# Patient Record
Sex: Female | Born: 1950 | Race: White | Hispanic: No | Marital: Married | Smoking: Never smoker
Health system: Southern US, Community
[De-identification: ages and names within clinical notes are randomized; demographics above are authoritative.]

## PROBLEM LIST (undated history)

## (undated) DIAGNOSIS — M549 Dorsalgia, unspecified: Secondary | ICD-10-CM

## (undated) DIAGNOSIS — F32A Depression, unspecified: Secondary | ICD-10-CM

## (undated) DIAGNOSIS — E039 Hypothyroidism, unspecified: Secondary | ICD-10-CM

## (undated) DIAGNOSIS — N183 Chronic kidney disease, stage 3 unspecified: Secondary | ICD-10-CM

## (undated) DIAGNOSIS — R42 Dizziness and giddiness: Secondary | ICD-10-CM

## (undated) DIAGNOSIS — G8929 Other chronic pain: Secondary | ICD-10-CM

## (undated) DIAGNOSIS — F329 Major depressive disorder, single episode, unspecified: Secondary | ICD-10-CM

## (undated) DIAGNOSIS — N189 Chronic kidney disease, unspecified: Secondary | ICD-10-CM

## (undated) DIAGNOSIS — K219 Gastro-esophageal reflux disease without esophagitis: Secondary | ICD-10-CM

## (undated) DIAGNOSIS — K5010 Crohn's disease of large intestine without complications: Secondary | ICD-10-CM

## (undated) DIAGNOSIS — G4730 Sleep apnea, unspecified: Secondary | ICD-10-CM

## (undated) DIAGNOSIS — G629 Polyneuropathy, unspecified: Secondary | ICD-10-CM

## (undated) DIAGNOSIS — F410 Panic disorder [episodic paroxysmal anxiety] without agoraphobia: Secondary | ICD-10-CM

## (undated) DIAGNOSIS — I10 Essential (primary) hypertension: Secondary | ICD-10-CM

## (undated) DIAGNOSIS — M1990 Unspecified osteoarthritis, unspecified site: Secondary | ICD-10-CM

## (undated) DIAGNOSIS — I499 Cardiac arrhythmia, unspecified: Secondary | ICD-10-CM

## (undated) DIAGNOSIS — D509 Iron deficiency anemia, unspecified: Secondary | ICD-10-CM

## (undated) HISTORY — DX: Hypothyroidism, unspecified: E03.9

## (undated) HISTORY — PX: OTHER SURGICAL HISTORY: SHX169

## (undated) HISTORY — DX: Crohn's disease of large intestine without complications: K50.10

## (undated) HISTORY — PX: CHOLECYSTECTOMY: SHX55

## (undated) HISTORY — DX: Polyneuropathy, unspecified: G62.9

## (undated) HISTORY — DX: Panic disorder (episodic paroxysmal anxiety): F41.0

## (undated) HISTORY — PX: ABDOMINAL HYSTERECTOMY: SHX81

## (undated) HISTORY — DX: Iron deficiency anemia, unspecified: D50.9

## (undated) HISTORY — PX: COLON SURGERY: SHX602

---

## 2003-01-06 ENCOUNTER — Ambulatory Visit (HOSPITAL_COMMUNITY): Admission: RE | Admit: 2003-01-06 | Discharge: 2003-01-06 | Payer: Self-pay | Admitting: Internal Medicine

## 2006-07-14 ENCOUNTER — Inpatient Hospital Stay (HOSPITAL_COMMUNITY): Admission: RE | Admit: 2006-07-14 | Discharge: 2006-07-17 | Payer: Self-pay | Admitting: Orthopedic Surgery

## 2006-10-09 ENCOUNTER — Ambulatory Visit: Payer: Self-pay | Admitting: Internal Medicine

## 2007-06-18 ENCOUNTER — Ambulatory Visit: Payer: Self-pay | Admitting: Cardiology

## 2009-03-16 ENCOUNTER — Ambulatory Visit (HOSPITAL_COMMUNITY): Admission: RE | Admit: 2009-03-16 | Discharge: 2009-03-16 | Payer: Self-pay | Admitting: Urology

## 2009-11-12 ENCOUNTER — Emergency Department (HOSPITAL_BASED_OUTPATIENT_CLINIC_OR_DEPARTMENT_OTHER): Admission: EM | Admit: 2009-11-12 | Discharge: 2009-11-12 | Payer: Self-pay | Admitting: Emergency Medicine

## 2010-09-25 LAB — DIFFERENTIAL
Basophils Absolute: 0 10*3/uL (ref 0.0–0.1)
Basophils Relative: 1 % (ref 0–1)
Eosinophils Relative: 4 % (ref 0–5)
Monocytes Absolute: 0.4 10*3/uL (ref 0.1–1.0)
Monocytes Relative: 7 % (ref 3–12)
Neutro Abs: 3.2 10*3/uL (ref 1.7–7.7)

## 2010-09-25 LAB — URINE MICROSCOPIC-ADD ON

## 2010-09-25 LAB — CBC
HCT: 37.3 % (ref 36.0–46.0)
Hemoglobin: 12.7 g/dL (ref 12.0–15.0)
MCHC: 34 g/dL (ref 30.0–36.0)
RBC: 4.13 MIL/uL (ref 3.87–5.11)
RDW: 12 % (ref 11.5–15.5)

## 2010-09-25 LAB — BASIC METABOLIC PANEL
BUN: 16 mg/dL (ref 6–23)
Chloride: 107 mEq/L (ref 96–112)
Creatinine, Ser: 1 mg/dL (ref 0.4–1.2)
Glucose, Bld: 104 mg/dL — ABNORMAL HIGH (ref 70–99)
Potassium: 3.8 mEq/L (ref 3.5–5.1)

## 2010-09-25 LAB — RAPID STREP SCREEN (MED CTR MEBANE ONLY): Streptococcus, Group A Screen (Direct): NEGATIVE

## 2010-09-25 LAB — URINALYSIS, ROUTINE W REFLEX MICROSCOPIC
Bilirubin Urine: NEGATIVE
Glucose, UA: NEGATIVE mg/dL
Specific Gravity, Urine: 1.024 (ref 1.005–1.030)

## 2010-11-23 NOTE — Op Note (Signed)
Jessica [name], Jessica [name]                ACCOUNT NO.:  [id number]   MEDICAL RECORD NO.:  [id number]          PATIENT TYPE:  INP   LOCATION:  X005                         FACILITY:  [facility]   PHYSICIAN:  Gaynelle Arabian, M.D.    DATE OF BIRTH:  09-Sep-1950   DATE OF PROCEDURE:  07/14/2006  DATE OF DISCHARGE:                               OPERATIVE REPORT   PREOPERATIVE DIAGNOSIS:  Osteoarthritis right hip.   POSTOPERATIVE DIAGNOSIS:  Osteoarthritis right hip.   PROCEDURE:  Right total hip arthroplasty.   SURGEON:  Dr. Wynelle Link   ASSISTANT:  [name], PA-C   ANESTHESIA:  General.   ESTIMATED BLOOD LOSS:  300.   DRAINS:  Hemovac x1.   COMPLICATIONS:  None.   CONDITION:  Stable to recovery.   BRIEF CLINICAL NOTE:  Jessica [name] is a 60 year old female with end-stage  osteoarthritis of the right hip.  She has had intractable pain and  failed nonoperative management.  She presents for total hip  arthroplasty.   PROCEDURE IN DETAIL:  After successful initiation of general anesthetic,  she is placed in the left lateral decubitus position with the right side  up and held with the hip positioner.  Right lower extremity is isolated  from perineum with plastic drapes and prepped and draped in the usual  sterile fashion.  Short posterolateral incision was made with 10 blade  through subcutaneous tissue to the level of fascia lata which was  incised in line with the skin incision.  Sciatic nerve was palpated and  protected and the short external rotators isolated off the femur.  Capsulectomy is performed, and the hip is dislocated.  Center of the  femoral head is marked and a trial prosthesis placed such that the  center of the trial head corresponds to the center of her native femoral  head.  Osteotomy line is marked on the femoral neck and osteotomy made  with an oscillating saw.  Femoral head is removed and then the femur  retracted anteriorly to gain acetabular exposure.   Acetabular  retractors were placed and the labrum and osteophytes  removed.  She had a very hypertrophic anterior labrum with degeneration.  Acetabular reaming starts at 47 mm coursing increments of 2-51 mm and a  52 mm pinnacle acetabular shell was placed in anatomic position and  transfixed with dome screw.  The permanent apex hole eliminator and 36  mm neutral Ultramet needle metal liner was placed.   The femur is addressed.  It is prepared with the canal finder and  irrigation.  Axial reaming is performed at 13.5 mm, proximal reaming to  an18D, and the sleeve machined to a large.  Eighteen D large trial  sleeve is placed and the 18 x 13 stem and 36 plus 8 neck matching her  native anteversion.  A 36.0 trial head is placed.  The hip is reduced  with great stability.  She had full extension, full external rotation 70  degrees flexion, 40 degrees adduction and 90 degrees internal rotation  then 90 degrees of flexion and 70 degrees of internal rotation.  By  placing the right leg on top of the left, it felt as though the leg  lengths are equal.   The hip was then dislocated and the trials removed.  The permanent 18 D  large sleeve is placed, 18 x 13 stem and 36 plus 8 neck matching native  anteversion.  The 36.0 head is placed.  The hip was reduced with  outstanding stability.  Wound was copiously irrigated with saline  solution and the short rotators reattached to the femur through drill  holes.  Fascia lata is closed over a Hemovac drain with interrupted #1  Vicryl and subcu closed with #1 and then 2-0 Vicryl and subcuticular  running 4-0 Monocryl.  Drain is hooked to suction.  Incision cleaned and  dried and Steri-Strips and a bulky sterile dressing applied.  She is  then awakened and transferred to recovery in stable condition.      Gaynelle Arabian, M.D.  Electronically Signed     FA/MEDQ  D:  07/14/2006  T:  07/14/2006  Job:  [id number]

## 2010-11-23 NOTE — Discharge Summary (Signed)
[name], [name]                ACCOUNT NO.:  [id number]   MEDICAL RECORD NO.:  [id number]          PATIENT TYPE:  INP   LOCATION:  1508                         FACILITY:  [facility]   PHYSICIAN:  Gaynelle Arabian, M.D.    DATE OF BIRTH:  May 04, 1951   DATE OF ADMISSION:  07/14/2006  DATE OF DISCHARGE:  07/17/2006                               DISCHARGE SUMMARY   ADMITTING DIAGNOSES:  1. Osteoarthritis right hip.  2. Depression.  3. Hypertension.  4. Hiatal hernia.  5. Reflux disease.  6. Crohn's disease.  7. History of urinary tract infections.  8. Past history of hematuria.  9. Hypothyroidism.  10.Postmenopausal.   DISCHARGE DIAGNOSES:  1. Osteoarthritis right hip status post right total hip arthroplasty.  2. Depression.  3. Hypertension.  4. Hiatal hernia.  5. Reflux disease.  6. Crohn's disease.  7. History of urinary tract infections.  8. Past history of hematuria.  9. Hypothyroidism.  10.Postmenopausal.   PROCEDURE:  July 14, 2006 right total hip, surgeon Dr. Wynelle Link,  assistant [name], PA-C, anesthesia general.   CONSULTATIONS:  None.   BRIEF HISTORY:  Jessica [name] is a 60 year old female with end-stage arthritis of  her right hip with intractable pain failed nonoperative management now  presents for total hip.   LABORATORY DATA:  Preop CBC showed hemoglobin 12.1, hematocrit of 34.8,  white cell count 9.6, postop hemoglobin 10.8 drifted down to 10.2, last  known H&H 9.7 and 27.6.  PT and PTT preop 12.8 and 31, respectively; INR  1.0.  Serial ProTime's last known PT/INR 28.4 and 2.5.  Chem panel on  admission all within normal limits.  Serial BMETs were followed.  Electrolytes remained within normal limits.  Preop UA moderate  hemoglobin 0-2 white cells, few bacteria, rare epithelial cells.  Blood  group type O positive.   ELECTROCARDIOGRAM:  July 04, 2006 normal sinus rhythm, inferior  infarct age undetermined, no previous to compare confirmed by Dr. Mertie Moores.   Portable pelvis July 14, 2006 good position alignment in one view  status post right hip arthroplasty.  Portable chest July 14, 2006 no  active cardiopulmonary disease, stable calcified granulomata in the left  lower lobe, right upper extremity PICC coiled in the axilla and  subclavian veins.  Two-view chest July 04, 2006 no active disease.  Right hip films preop degenerative arthritis involving the right hip  joint July 04, 2006.  Portable pelvis satisfactory position of right  PIC line July 14, 2006.   HOSPITAL COURSE:  The patient was taken to the hospital, had a PIC line  placed preoperatively with confirmation by chest x-ray then she was  taken to the OR and underwent above stated procedure without  complications.  The patient tolerated procedure well and later to  recovery room on the orthopedic floor, had a rough night on the evening  of surgery, started on PCA and p.o. analgesics, doing a little bit  better on the morning of day #1, started to have some itching and hives  but this has been going on for 3 weeks now, used Benadryl and  hydrocortisone cream, started back on her home meds.  Preop UA was  positive for blood, otherwise negative, did have past history of  hematuria, this was noted to be preoperatively.  Started also IV Pepcid  for H2 blocker.  Started getting up out of bed with PT.  By day #2 she  was doing a little bit better, the itching had improved, started getting  up and walk about 50 feet and 55 feet later that day.  Dressing was  changed and incision looked good.  Continued to progress well with  physical therapy and by day #3 she was progressing well, tolerating her  meds which she had been weaned over to and was decided to be discharged  home at that time.   DISCHARGE PLAN:  1. The patient discharged home on July 17, 2006.  2. Discharge diagnoses:  Please see above.  3. Discharge medications:  Coumadin, Percocet, Robaxin.  4.  Diet:  Resume home diet.  5. Followup:  2 weeks.  6. Activity:  Partial weightbearing 25-50%.  Total hip protocol.  Home      with PT.  Home with nursing.  7. Disposition:  Home.  8. Condition upon discharge:  Improved.      Jessica [name], P.A.      Gaynelle Arabian, M.D.  Electronically Signed    ALP/MEDQ  D:  08/15/2006  T:  08/15/2006  Job:  [id number]   cc:   Monico Blitz, MD  Fax: [phone]

## 2010-11-23 NOTE — H&P (Signed)
[name], [name]                           ACCOUNT NO.:  [id number]   MEDICAL RECORD NO.:  [id number]                  PATIENT TYPE:   LOCATION:                                       FACILITY:   PHYSICIAN:  Hildred Laser, M.D.                 DATE OF BIRTH:  1951/02/20   DATE OF ADMISSION:  DATE OF DISCHARGE:                                HISTORY & PHYSICAL   PRESENTING COMPLAINT:  Heartburn, dysphagia, lower abdominal pain, diarrhea.   HISTORY OF PRESENT ILLNESS:  Jessica [name] is a 60 year old Caucasian female with a  several year history of Crohn's disease who is status post right  hemicolectomy in 1997 presents with multiple complaints.  She was initially  seen in our office in September 2000 and most recently on April 15, 2001  when she was complaining of lower abdominal pain and increasing diarrhea and  felt to have a relapse of her Crohn's disease.  She was treated with  Entocort and asked to stay on Pentasa.  At that time she did have small  bowel follow through which was normal other then post-op changes.  She has  not been feeling well for the past few months.  She stopped her Pentasa a  while back.  She states she works second shift anywhere from 10-12 hours per  day and it is just hard for her to take Pentasa four times a day.  However,  she never called [place] or asked if she could take it b.i.d. or in any other way  or if there were alternatives.  She has been experiencing severe pain across  her lower abdomen.  She also has diarrhea with anywhere from 8-10 stools per  day.  Stools are loose and watery.  She has not had any melena or rectal  bleeding.  She states her lower abdominal pain is crampy and it is worse  after she has been under stress.  She has lost 6 pounds in the last 20  months.  She has not experienced any fever or chills.  She also complains of  heartburn which is almost every day or every other day.  It is worse with  certain foods.  She has been using  Zantac on a p.r.n. basis which helps  some.  She previously had been on Nexium but came off of it because of  coverage issues.  She also complains of dysphagia which she has experienced  for the last 2-3 months.  It is primarily to solids.  She points to her  midsternal area as the site of bolus obstruction which eventually passes  down.  She also has frequent regurgitation.  She denies odynophagia,  hoarseness or chronic cough.  She states her appetite is too good.  She is  afraid to eat because of her cramps and diarrhea.  She has been under a lot  of stress recently.  She just  came off a two week leave of absence and she  had taken a few more prior to that.  Imodium does not work well with the  diarrhea and she has been afraid to take Lomotil.  She still has some from  the last prescription.  However, she knows it does help her.  She was told  by a physician a few years back that she could be become addicted to this  medication.   MEDICATIONS:  1. She is on Estratest unknown dose daily.  2. Synthroid 125 mcg daily.  3. Lisinopril 20 mg daily.  4. Lomotil 1 t.i.d. p.r.n. she takes occasionally.  5. Aspirin 81 mg daily.   PAST MEDICAL HISTORY:  1. She has hypertension.  2. Hypothyroidism.  3. History of osteopenia.  4. She has had a hysterectomy.  5. Cholecystectomy.  6. Removal of a lipoma from the right cheek mandible area.  7. As noted above she had a right hemicolectomy for Crohn's disease seven     years ago.   ALLERGIES:  None known.   FAMILY HISTORY:  Significant for Crohn's disease in a brother.   SOCIAL HISTORY:  She is presently working at [employer].  She does not smoke  cigarettes and drinks alcohol very occasionally.   PHYSICAL EXAMINATION:  GENERAL:  A pleasant mildly obese Caucasian female  who is in no acute distress.  VITAL SIGNS:  She weighs 184 pounds.  She is 5 feet 2 inches tall.  Pulse 96  per minute, blood pressure 114/80.  She is afebrile.  HEENT:   Oropharyngeal mucosa  is normal.  NECK:  Without masses or thyromegaly.  CARDIAC:  Regular rhythm.  Normal S1 and S2.  No murmur or gallop noted.  LUNGS:  Clear to auscultation.  ABDOMEN:  Full.  Bowel sounds are hyperactive.  Palpation reveals mild  tenderness across the lower abdomen.  No organomegaly or masses noted.  RECTAL:  Deferred.  EXTREMITIES:  She does not have peripheral edema or clubbing.   ASSESSMENT:  1. History of Crohn's disease.  The patient now presents with progressive     diarrhea and lower abdominal cramps.  This is possibly due to a relapse     of Crohn's disease.  She could also have irritable bowel syndrome along     with that.  She is presently on no maintenance therapy.  She maybe a     candidate for other agents but we will have to first determine whether or     not her Crohn's disease is active.  2. Chronic gastroesophageal reflux disease.  Patient now has solid food     dysphagia.   RECOMMENDATIONS:  1. Will start her on Levbid 1/2-1 tablet every morning.  2. Questran 4 grams b.i.d.  3. Lomotil one t.i.d. p.r.n. prescription given for 60 with a refill.  4. She will start on Protonix 40 mg q.a.m.  5. EGD with ED followed by total colonoscopy to be performed at [facility] in the     near future.  Further recommendations will depend on endoscopic findings.                                               Hildred Laser, M.D.    NR/MEDQ  D:  12/14/2002  T:  12/14/2002  Job:  [id number]   cc:   Monico Blitz  [place]  [place] [address]  Fax: [phone]

## 2010-11-23 NOTE — H&P (Signed)
Jessica [name], Jessica [name]                ACCOUNT NO.:  [id number]   MEDICAL RECORD NO.:  [id number]           PATIENT TYPE:   LOCATION:                               FACILITY:  [facility]   PHYSICIAN:  Gaynelle Arabian, M.D.         DATE OF BIRTH:   DATE OF ADMISSION:  DATE OF DISCHARGE:                              HISTORY & PHYSICAL   CHIEF COMPLAINT:  Right hip pain.   HISTORY OF PRESENT ILLNESS:  The patient is a 60 year old female seen in  second opinion by Dr. Wynelle Link for ongoing right hip pain, progressive in  nature.  She has undergone trochanteric bursal injections by Dr.  Alfredo Batty. Unfortunately this did not provide much relief.  The pain  was in the groin, anterior thigh, lateral thigh.  She started having  increasing problems with walking.  She \was seen by Dr. Wynelle Link and  found to eventually have arthritic changes in the right hip with bone on  bone throughout with some osteophyte formation.  She is known to have  end-stage arthritis.  At this point it is felt the only significant way  of improving her function would be hip replacement.  Risks and benefits  discussed.  The patient was subsequently admitted to the hospital.   ALLERGIES:  No known drug allergies.   CURRENT MEDICATIONS:  Prevacid, levothyroxine, Celebrex, lisinopril,  Zoloft, , Caltrate D, Centrum Silver, low dose baby aspirin.   PAST MEDICAL HISTORY:  1. Depression.  2. Hypertension.  3. Hiatal hernia.  4. Reflux disease.  5. Crohn's disease.  6. History of UTIs.  7. Past history of hematuria, likely due to previous UTIs.  8. Hypothyroidism.  9. History of arthritis.  10.Postmenopausal.   PAST SURGICAL HISTORY:  1. Partial hysterectomy.  2. Partial colectomy.  3. Cholecystectomy.  4. Lumbar surgery.  5. Lipoma excision.   SOCIAL HISTORY:  Married, Public relations account executive.  Nonsmoker, no alcohol.  Two children.   FAMILY HISTORY:  Mother living, age 24, with history of osteoarthritis,  heart disease, and  mini strokes.  Father deceased, age 69, with history  of aneurysm of the aorta.   REVIEW OF SYSTEMS:  GENERAL:  No fever, chills, night sweats.  NEUROLOGIC:  No seizures, syncope, paralysis.  RESPIRATORY:  No  shortness of breath, productive cough, or hemoptysis.  CARDIOVASCULAR:  No chest pain, angina, or orthopnea.  GASTROINTESTINAL:  Does have  Crohn's disease.  No nausea, vomiting, diarrhea, constipation at this  time.  GENITOURINARY:  History of hematuria, felt to be due likely to  urine infections, possibly stones in the past.  Denies any hematuria or  dysuria at this time.  MUSCULOSKELETAL:  Right hip.   PHYSICAL EXAMINATION:  VITAL SIGNS:  Pulse 76, respiratory rate 14,  blood pressure 142/94.  GENERAL:  A 60 year old white female, well-nourished, well-developed,  short stature, slightly overweight.  Alert and oriented, cooperative,  pleasant, accompanied by her husband.  HEENT:  Normocephalic, atraumatic.  Pupils are round and reactive.  Oropharynx clear.  EOMs intact.  NECK:  Supple.  CHEST:  Clear.  HEART:  Regular rate and rhythm.  S1/S2.  ABDOMEN:  Soft, slightly round.  Bowel sounds present.  RECTAL/BREASTS/GENITALIA:  Not done, not pertinent to present illness.  EXTREMITIES:  Right hip flexion 90, 0 internal rotation, 10 degrees of  external rotation.  Abduction 20, slight limp.   IMPRESSION:  1. Osteoarthritis, right hip.  2. Depression.  3. Hypertension.  4. Hiatal hernia.  5. Reflux disease.  6. Crohn's disease.  7. History of urinary tract infections.  8. Past history of hematuria.  9. Hypothyroidism.  10.Postmenopausal.   PLAN:  The patient is admitted to [facility] to undergo a  right total hip arthroplasty.  Surgery will be performed by Dr. Gaynelle Arabian.      Alexzandrew L. Dara Lords, P.A.      Gaynelle Arabian, M.D.  Electronically Signed    ALP/MEDQ  D:  07/13/2006  T:  07/14/2006  Job:  [id number]   cc:   Monico Blitz, MD  Fax:  [phone]   Gaynelle Arabian, M.D.  Fax: [phone]

## 2010-11-23 NOTE — Op Note (Signed)
[name], [name]                          ACCOUNT NO.:  [id number]   MEDICAL RECORD NO.:  [id number]                   PATIENT TYPE:  AMB   LOCATION:  DAY                                  FACILITY:  [facility]   PHYSICIAN:  Hildred Laser, M.D.                 DATE OF BIRTH:  07-08-1951   DATE OF PROCEDURE:  01/06/2003  DATE OF DISCHARGE:                                  PROCEDURE NOTE   PROCEDURE:  Esophagogastroduodenoscopy with esophageal dilatation followed  by total colonoscopy.   INDICATIONS:  Jessica [name] is a 60 year old Caucasian female who has chronic  heartburn and has been on OTC medications with poor to fair control of her  heartburn who is now having solid food dysphagia.  She was begun on Protonix  and has noted improvement in her heartburn.  She has history of Crohn's  disease.  She has had right hemicolectomy about seven years ago.  She is  presently on no maintenance therapy and having 6-10 stools per day.  Some of  her symptoms are suggestive of irritable bowel syndrome but she could also  have relapse of her Crohn's disease and after undergoing evaluation.   Both procedures were reviewed with the patient and informed consent was  obtained.   PREMEDICATION:  Cetacaine spray for pharyngeal topical anesthesia, Demerol  50 mg IV, Versed 8 mg IV in divided doses.   INSTRUMENT:  Olympus video system.   FINDINGS:  The procedures were performed in the endoscopy suite.  The  patient's vital signs and O2 saturation were monitored during the procedure  and remained stable.   ESOPHAGOGASTRODUODENOSCOPY:  The patient was placed in the left lateral  recumbent position and endoscope was passed through the oropharynx without  any difficulty into the esophagus.  Esophagus:  Mucosa of the proximal and middle third was normal.  Distally  she had three to four erosions, the largest was about 5 x 5 mm extending  into the GE junction which was _______, and there was a 3 cm size  sliding  hiatal hernia.  Stomach:  It was empty and distended very well on insufflation.  Folds of  the proximal stomach were normal.  Examination of the mucosa, gastric body,  antrum, pyloric channel, as well as angularis, fundus and cardia was normal.  Duodenum:  Examination of the bulb and post bulbar duodenum was normal.   The endoscope was withdrawn and the esophagus was dilated by passing 54-  [place] Maloney dilator through the esophagus.  Esophagus was reexamined post  dilatation and there was no mucosal injury.  I therefore, opted to dilate  the GE junction with an 18-20 balloon.  This resulted in a very small linear  tear of the GE junction.  Endoscope was withdrawn and the patient was  prepared for procedure #2.   TOTAL COLONOSCOPY:  Rectal examination performed.  No abnormality noted on  the  external or digital exam.  Scope was placed in the rectum and advanced  under vision to the sigmoid colon and beyond.  She had satisfactory prep.  The scope was passed into the area of hepatic flexure which was very  tortuous.  Using different positions and abdominal pressure, I was able to  get the scope through the ileocolic anastomosis which was wide open.  Short  segment of terminal ileum was examined above that.  There were three  erosions but no frank ulceration or stricture.  Pictures were taken for the  record.  As the scope was withdrawn, colonic mucosa was carefully examined.  There was a single diverticulum a few centimeters below the anastomosis.  Had very stromal erythema but there was no ulceration.  Mucosa of the rest  of the colon was normal.  Rectal mucosa similarly was normal.  Scope was  retroflexed, examined the anorectal junction and hemorrhoids were noted  below the dentate line.  Endoscope was straightened and withdrawn. The  patient tolerated the procedure well.   FINAL DIAGNOSES:  1. Erosive reflux esophagitis except noncritical distal esophageal ring and      has a small sliding hiatal hernia.  Esophagus was dilated both by passing     54-[place] Maloney dilator and also using 18-20 mm balloon.  2. Normal examination of the stomach for a second part of duodenum.  3. Single diverticulum at transverse colon with mild symptoms of     diverticulitis.  This does not appear to be causing the symptoms;     nevertheless needs to be treated.  4. Erosions involving the distal small bowel proximal to the ileocolic     anastomosis.  5. Small external hemorrhoids.  6. Suspect her diarrhea is mainly due to bile effect on the colon and     perhaps mild Crohn's disease.   RECOMMENDATIONS:  1. She is going to need antireflux measures and Protonix at 40 mg p.o.     q.a.m.  Prescription given for a month, five refills.  2. She is to continue Levbid one tablet every morning and Questran 4-8 grams     per day.  3. I feel she needs to be on Pentasa, low dose of 1 g b.i.d., would be     appropriate, at least for now.  4. Cipro 500 mg p.o. b.i.d. for one week.  5. Metronidazole 500 mg p.o. b.i.d. for one week.  6. I would like to see her back in followup two months from now.                                                Hildred Laser, M.D.    NR/MEDQ  D:  01/06/2003  T:  01/07/2003  Job:  [id number]   cc:   Monico Blitz  [place]  [place] [address]  Fax: [phone]

## 2010-12-27 DIAGNOSIS — R072 Precordial pain: Secondary | ICD-10-CM

## 2012-12-30 ENCOUNTER — Telehealth (INDEPENDENT_AMBULATORY_CARE_PROVIDER_SITE_OTHER): Payer: Self-pay | Admitting: *Deleted

## 2012-12-30 NOTE — Telephone Encounter (Signed)
Jessica [name] called our office today, 12/30/12 to schedule an apt. We have no records in [facility] or paper chart. Patient states she needs a f/u and it may be time for her next procedure. Langston said it may have been 4 years ago scene her last visit. Advised [name] she will need a referral from her PCP before an apt can be scheduled and I would try to get a copy of her last TCS. [name] has taken it upon her self to call [facility] to try get a copy of her records stating [facility] would not see her and that she is having a flare up with Crohns. Per Leonia Reeves, please have Terri call Zina and see what is going on. A copy of TCS done in 2008 has been given to Deberah Castle, NP. Patient's return phone number is [phone].

## 2012-12-30 NOTE — Telephone Encounter (Signed)
I have called patient and left a message for her to call us back.

## 2012-12-30 NOTE — Telephone Encounter (Signed)
Message left at home

## 2012-12-31 NOTE — Telephone Encounter (Signed)
Patient returned Terri's call from [phone]. Transferred the message to her voice mail box at ext [phone].

## 2012-12-31 NOTE — Telephone Encounter (Signed)
No answer at this number. No answering machine

## 2013-01-11 ENCOUNTER — Encounter (INDEPENDENT_AMBULATORY_CARE_PROVIDER_SITE_OTHER): Payer: Self-pay | Admitting: *Deleted

## 2013-02-10 ENCOUNTER — Telehealth (INDEPENDENT_AMBULATORY_CARE_PROVIDER_SITE_OTHER): Payer: Self-pay | Admitting: *Deleted

## 2013-02-10 ENCOUNTER — Other Ambulatory Visit (INDEPENDENT_AMBULATORY_CARE_PROVIDER_SITE_OTHER): Payer: Self-pay | Admitting: *Deleted

## 2013-02-10 ENCOUNTER — Encounter (INDEPENDENT_AMBULATORY_CARE_PROVIDER_SITE_OTHER): Payer: Self-pay | Admitting: Internal Medicine

## 2013-02-10 ENCOUNTER — Ambulatory Visit (INDEPENDENT_AMBULATORY_CARE_PROVIDER_SITE_OTHER): Payer: BC Managed Care – PPO | Admitting: Internal Medicine

## 2013-02-10 VITALS — BP 132/68 | HR 76 | Temp 98.7°F | Ht 62.0 in | Wt 186.5 lb

## 2013-02-10 DIAGNOSIS — G629 Polyneuropathy, unspecified: Secondary | ICD-10-CM | POA: Insufficient documentation

## 2013-02-10 DIAGNOSIS — K5090 Crohn's disease, unspecified, without complications: Secondary | ICD-10-CM

## 2013-02-10 DIAGNOSIS — E039 Hypothyroidism, unspecified: Secondary | ICD-10-CM | POA: Insufficient documentation

## 2013-02-10 DIAGNOSIS — Z1211 Encounter for screening for malignant neoplasm of colon: Secondary | ICD-10-CM

## 2013-02-10 DIAGNOSIS — K5010 Crohn's disease of large intestine without complications: Secondary | ICD-10-CM | POA: Insufficient documentation

## 2013-02-10 LAB — CBC WITH DIFFERENTIAL/PLATELET
Basophils Absolute: 0 10*3/uL (ref 0.0–0.1)
Basophils Relative: 0 % (ref 0–1)
MCHC: 33.4 g/dL (ref 30.0–36.0)
Monocytes Absolute: 0.4 10*3/uL (ref 0.1–1.0)
Neutro Abs: 2.3 10*3/uL (ref 1.7–7.7)
Neutrophils Relative %: 46 % (ref 43–77)
Platelets: 195 10*3/uL (ref 150–400)
RDW: 14 % (ref 11.5–15.5)
WBC: 4.9 10*3/uL (ref 4.0–10.5)

## 2013-02-10 MED ORDER — DICYCLOMINE HCL 10 MG PO CAPS: 10.0000 mg | ORAL_CAPSULE | Freq: Three times a day (TID) | ORAL | Status: DC

## 2013-02-10 NOTE — Telephone Encounter (Signed)
Patient needs trilyte 

## 2013-02-10 NOTE — Patient Instructions (Addendum)
CBC,sedrate, Surveillance colonoscopy  Dicyclomine 55m BID

## 2013-02-10 NOTE — Progress Notes (Signed)
Subjective:     Patient ID: Jessica [name], female   DOB: 05-11-1951, 62 y.o.   MRN: [id number]  HPI Referred to our office a surveillance colonoscopy for Crohn's disease.(Dr. [name]). Appetite is good. No weight loss. No dysphagia.  No abdominal pain. She usually has a BM 4-5 days and they are loose. She has not seen any blood in her stools.  She tells me her last Crohn's flare 1998. She tells me she has diarrhea frequently. She very seldom has a regular BM since her rt hemicolectomy. She tells me she was maintained on Pentasa in 2008 but stopped after a year or so. She did not like taking medications.   She is on C-pap at home.   Colonoscopy 03/05/2007: Dr. Laural Golden: 62 yr old female with over a 87 yr hx of small bowel Crohn's disease with surgery possible rt hemicolectomy over 21 yrs ago.  Normal colonic mucosa. Patient ileocecal anastomosis with 64m ulceration and another erosion. No evidence of stricture or disease involving the neoterminal ileum. Biopsy: Acute ileitis, nonspecific.    EGD/Colonoscopy 2004: Chronic GERD, Crohn's disease:    1. Erosive reflux esophagitis except noncritical distal esophageal ring and  has a small sliding hiatal hernia. Esophagus was dilated both by passing  54-French Maloney dilator and also using 18-20 mm balloon.  2. Normal examination of the stomach for a second part of duodenum.  3. Single diverticulum at transverse colon with mild symptoms of  diverticulitis. This does not appear to be causing the symptoms;  nevertheless needs to be treated.  4. Erosions involving the distal small bowel proximal to the ileocolic  anastomosis.  5. Small external hemorrhoids.  6. Suspect her diarrhea is mainly due to bile effect on the colon and  perhaps mild Crohn's disease.   Review of Systems see hpi Current Outpatient Prescriptions  Medication Sig Dispense Refill  . aspirin 81 MG tablet Take 81 mg by mouth daily.      . calcium-vitamin D (OSCAL WITH D)  500-200 MG-UNIT per tablet Take 1 tablet by mouth.      . cholecalciferol (VITAMIN D) 1000 UNITS tablet Take by mouth 2 (two) times daily before a meal.       . fluticasone (FLONASE) 50 MCG/ACT nasal spray Place 2 sprays into the nose daily.      .Marland KitchenHYDROcodone-homatropine (HYDROMET) 5-1.5 MG/5ML syrup Take by mouth every 6 (six) hours as needed for cough.      . levothyroxine (SYNTHROID) 112 MCG tablet Take 112 mcg by mouth daily before breakfast.      . lisinopril (PRINIVIL,ZESTRIL) 20 MG tablet Take 20 mg by mouth daily.      .Marland Kitchenomeprazole (PRILOSEC) 20 MG capsule Take 20 mg by mouth daily.      . sertraline (ZOLOFT) 50 MG tablet Take 50 mg by mouth daily.      . vitamin B-12 (CYANOCOBALAMIN) 100 MCG tablet Take 50 mcg by mouth daily.       No current facility-administered medications for this visit.  " Past Medical History  Diagnosis Date  . Crohn's colitis   . Neuropathy   . Hypothyroid    Past Surgical History  Procedure Laterality Date  . Colon surgery      [place]       [place]     No Known Allergies     Objective:   Physical Exam  Filed Vitals:   02/10/13 1505  BP: 132/68  Pulse: 76  Temp: 98.7 F (37.1 C)  Height: 5' 2"  (1.575 m)  Weight: 186 lb 8 oz (84.596 kg)  Alert and oriented. Skin warm and dry. Oral mucosa is moist.   . Sclera anicteric, conjunctivae is pink. Thyroid not enlarged. No cervical lymphadenopathy. Lungs clear. Heart regular rate and rhythm.  Abdomen is soft. Bowel sounds are positive. No hepatomegaly. No abdominal masses felt. Slight rt lower quadrant tenderness.  No edema to lower extremities.         Assessment:    Crohn's disease. She seems to be in remission at this time. She is doing well.   She did have slight rt lower quadrant tenderness today.    Plan:    Surveillance colonoscopy for Crohn's. CBC Sedrate. Dicyclomine 20m BID.

## 2013-02-11 LAB — SEDIMENTATION RATE: Sed Rate: 8 mm/hr (ref 0–22)

## 2013-02-11 MED ORDER — PEG 3350-KCL-NA BICARB-NACL 420 G PO SOLR: 4000.0000 mL | Freq: Once | ORAL | Status: DC

## 2013-02-25 ENCOUNTER — Encounter (INDEPENDENT_AMBULATORY_CARE_PROVIDER_SITE_OTHER): Payer: Self-pay

## 2013-03-11 ENCOUNTER — Ambulatory Visit (HOSPITAL_COMMUNITY)
Admission: RE | Admit: 2013-03-11 | Discharge: 2013-03-11 | Disposition: A | Discharge status: Home / Self Care | Payer: BC Managed Care – PPO | Source: Ambulatory Visit | Attending: Internal Medicine | Admitting: Internal Medicine

## 2013-03-11 ENCOUNTER — Encounter (HOSPITAL_COMMUNITY)
Admission: RE | Disposition: A | Discharge status: Home / Self Care | Payer: Self-pay | Source: Ambulatory Visit | Attending: Internal Medicine

## 2013-03-11 ENCOUNTER — Encounter (HOSPITAL_COMMUNITY): Payer: Self-pay | Admitting: *Deleted

## 2013-03-11 DIAGNOSIS — K633 Ulcer of intestine: Secondary | ICD-10-CM | POA: Insufficient documentation

## 2013-03-11 DIAGNOSIS — K5090 Crohn's disease, unspecified, without complications: Secondary | ICD-10-CM

## 2013-03-11 DIAGNOSIS — K5289 Other specified noninfective gastroenteritis and colitis: Secondary | ICD-10-CM | POA: Insufficient documentation

## 2013-03-11 DIAGNOSIS — R197 Diarrhea, unspecified: Secondary | ICD-10-CM | POA: Insufficient documentation

## 2013-03-11 DIAGNOSIS — Z9049 Acquired absence of other specified parts of digestive tract: Secondary | ICD-10-CM | POA: Insufficient documentation

## 2013-03-11 DIAGNOSIS — Z8719 Personal history of other diseases of the digestive system: Secondary | ICD-10-CM | POA: Insufficient documentation

## 2013-03-11 HISTORY — PX: COLONOSCOPY: SHX5424

## 2013-03-11 HISTORY — DX: Sleep apnea, unspecified: G47.30

## 2013-03-11 SURGERY — COLONOSCOPY
Anesthesia: Moderate Sedation

## 2013-03-11 MED ORDER — MESALAMINE ER 500 MG PO CPCR: 1000.0000 mg | ORAL_CAPSULE | Freq: Four times a day (QID) | ORAL | Status: DC

## 2013-03-11 MED ORDER — MIDAZOLAM HCL 5 MG/5ML IJ SOLN
INTRAMUSCULAR | Status: DC | PRN
  Administered 2013-03-11: 1 mg via INTRAVENOUS
  Administered 2013-03-11 (×2): 2 mg via INTRAVENOUS

## 2013-03-11 MED ORDER — STERILE WATER FOR IRRIGATION IR SOLN
Status: DC | PRN
  Administered 2013-03-11: 11:00:00

## 2013-03-11 MED ORDER — MEPERIDINE HCL 50 MG/ML IJ SOLN
INTRAMUSCULAR | Status: DC | PRN
  Administered 2013-03-11 (×2): 25 mg via INTRAVENOUS

## 2013-03-11 MED ORDER — MIDAZOLAM HCL 5 MG/5ML IJ SOLN
INTRAMUSCULAR | Status: AC
  Filled 2013-03-11: qty 10

## 2013-03-11 MED ORDER — MEPERIDINE HCL 50 MG/ML IJ SOLN
INTRAMUSCULAR | Status: AC
  Filled 2013-03-11: qty 1

## 2013-03-11 MED ORDER — SODIUM CHLORIDE 0.9 % IV SOLN
INTRAVENOUS | Status: DC
  Administered 2013-03-11: 11:00:00 via INTRAVENOUS

## 2013-03-11 NOTE — Op Note (Addendum)
COLONOSCOPY PROCEDURE REPORT  PATIENT:  Jessica [name]  MR#:  [id number] Birthdate:  12-09-1950, 62 y.o., female Endoscopist:  Dr. Rogene Houston, MD Referred By:  Dr. Monico Blitz, MD Procedure Date: 03/11/2013  Procedure:   Colonoscopy  Indications: Patient is 62 year-old Caucasian female with history of Crohn disease who presents with diarrhea. Her last colonoscopy was in August 2000 it. She is undergoing colonoscopy for diagnostic and surveillance purposes. She presently is on no therapy for IBD.  Informed Consent:  The procedure and risks were reviewed with the patient and informed consent was obtained.  Medications:  Demerol 50 mg IV Versed 5 mg IV  Description of procedure:  After a digital rectal exam was performed, that colonoscope was advanced from the anus through the rectum and colon to the area of the ileocolonic anastomosis located in the region of hepatic flexure.  The mucosal surfaces were carefully surveyed utilizing scope tip to flexion to facilitate fold flattening as needed. The scope was pulled down into the rectum where a thorough exam including retroflexion was performed.  Findings:  Prep satisfactory. Noncritical stricture at the ileocolonic anastomosis with small ulcer. Biopsy taken. Mucosa of a segment of colon and rectum was normal. Anorectal junction was unremarkable.   Therapeutic/Diagnostic Maneuvers Performed:  See above  Complications:  None  Cecal Withdrawal Time:  6 minutes  Impression:  Noncritical stricture at the ileocolonic anastomosis along with small ulcer. Biopsy taken. No evidence of colonic polyps or colitis.   Recommendations:  Standard instructions given. Resume Pentasa at 1 g by mouth 4 times a day. I will contact patient with biopsy results and further recommendations.  Lesleyann Fichter U  03/11/2013 11:34 AM  CC: Dr. Monico Blitz, MD & Dr. Rayne Du ref. provider found

## 2013-03-11 NOTE — H&P (Signed)
Jessica [name] is an 62 y.o. female.   Chief Complaint: Patient is  here for colonoscopy. HPI: Patient is 62 year old Caucasian female who has history of small bowel Crohn's status post right hemicolectomy 21 years ago who presents for diarrhea. She's having at least 5 stools a day. She denies rectal bleeding abdominal pain anorexia weight loss. Patient's last colonoscopy was in August 2008. Patient is presently on no maintenance therapy for IBD.  Past Medical History  Diagnosis Date  . Crohn's colitis   . Neuropathy   . Hypothyroid   . Sleep apnea     Past Surgical History  Procedure Laterality Date  . Colon surgery      for Crohn's in the 40s in [place]  . Hip replacement       Rt hip in 2010 in [place]  . Cholecystectomy    . Abdominal hysterectomy      Family History  Problem Relation Age of Onset  . Crohn's disease Brother   . Colon cancer Neg Hx    Social History:  reports that she has never smoked. She does not have any smokeless tobacco history on file. She reports that she does not drink alcohol or use illicit drugs.  Allergies:  Allergies  Allergen Reactions  . Morphine And Related     Causes itching    Medications Prior to Admission  Medication Sig Dispense Refill  . aspirin 81 MG tablet Take 81 mg by mouth daily.      . cholecalciferol (VITAMIN D) 1000 UNITS tablet Take 1,000 Units by mouth 2 (two) times daily before a meal.       . fluticasone (FLONASE) 50 MCG/ACT nasal spray Place 2 sprays into the nose daily.      Marland Kitchen gabapentin (NEURONTIN) 300 MG capsule Take 1 capsule by mouth 3 (three) times daily.      Marland Kitchen levothyroxine (SYNTHROID) 112 MCG tablet Take 112 mcg by mouth daily before breakfast.      . lisinopril (PRINIVIL,ZESTRIL) 20 MG tablet Take 20 mg by mouth daily.      Marland Kitchen omeprazole (PRILOSEC) 20 MG capsule Take 20 mg by mouth daily.      . polyethylene glycol-electrolytes (NULYTELY/GOLYTELY) 420 G solution Take 4,000 mLs by mouth once.  4000 mL  0   . sertraline (ZOLOFT) 50 MG tablet Take 50 mg by mouth daily.      . vitamin B-12 (CYANOCOBALAMIN) 100 MCG tablet Take 50 mcg by mouth daily.      . Vitamin D, Ergocalciferol, (DRISDOL) 50000 UNITS CAPS capsule Take 1 capsule by mouth once a week.      . calcium-vitamin D (OSCAL WITH D) 500-200 MG-UNIT per tablet Take 1 tablet by mouth daily with breakfast.       . dicyclomine (BENTYL) 10 MG capsule Take 1 capsule (10 mg total) by mouth 4 (four) times daily -  before meals and at bedtime.  90 capsule  2    No results found for this or any previous visit (from the past 48 hour(s)). No results found.  ROS  Blood pressure 169/98, pulse 76, temperature 98 F (36.7 C), temperature source Oral, resp. rate 14, height 5' 2"  (1.575 m), weight 186 lb (84.369 kg), SpO2 98.00%. Physical Exam  Constitutional: She appears well-developed and well-nourished.  HENT:  Mouth/Throat: Oropharynx is clear and moist.  Eyes: Conjunctivae are normal. No scleral icterus.  Neck: No thyromegaly present.  Cardiovascular: Normal rate, regular rhythm and normal heart sounds.   No  murmur heard. Respiratory: Effort normal and breath sounds normal.  GI: Soft. She exhibits no distension and no mass. There is no tenderness.  Midline and right subcostal scar  Musculoskeletal: She exhibits no edema.  Lymphadenopathy:    She has no cervical adenopathy.  Neurological: She is alert.  Skin: Skin is warm and dry.     Assessment/Plan Diarrhea. History of Crohn's disease. Diagnostic/surveillance colonoscopy.  [place] U 03/11/2013, 10:57 AM

## 2013-03-15 ENCOUNTER — Encounter (HOSPITAL_COMMUNITY): Payer: Self-pay | Admitting: Internal Medicine

## 2013-03-23 ENCOUNTER — Encounter (INDEPENDENT_AMBULATORY_CARE_PROVIDER_SITE_OTHER): Payer: Self-pay | Admitting: *Deleted

## 2013-03-24 DIAGNOSIS — G609 Hereditary and idiopathic neuropathy, unspecified: Secondary | ICD-10-CM | POA: Insufficient documentation

## 2013-03-24 DIAGNOSIS — M5417 Radiculopathy, lumbosacral region: Secondary | ICD-10-CM | POA: Insufficient documentation

## 2013-03-24 DIAGNOSIS — G5600 Carpal tunnel syndrome, unspecified upper limb: Secondary | ICD-10-CM | POA: Insufficient documentation

## 2013-03-24 DIAGNOSIS — G2581 Restless legs syndrome: Secondary | ICD-10-CM | POA: Insufficient documentation

## 2013-10-11 ENCOUNTER — Encounter (HOSPITAL_BASED_OUTPATIENT_CLINIC_OR_DEPARTMENT_OTHER): Payer: Self-pay | Admitting: Emergency Medicine

## 2013-10-11 ENCOUNTER — Emergency Department (HOSPITAL_BASED_OUTPATIENT_CLINIC_OR_DEPARTMENT_OTHER)
Admission: EM | Admit: 2013-10-11 | Discharge: 2013-10-11 | Disposition: A | Discharge status: Home / Self Care | Payer: BC Managed Care – PPO | Attending: Emergency Medicine | Admitting: Emergency Medicine

## 2013-10-11 DIAGNOSIS — Z8719 Personal history of other diseases of the digestive system: Secondary | ICD-10-CM | POA: Insufficient documentation

## 2013-10-11 DIAGNOSIS — Z7982 Long term (current) use of aspirin: Secondary | ICD-10-CM | POA: Insufficient documentation

## 2013-10-11 DIAGNOSIS — R5381 Other malaise: Secondary | ICD-10-CM | POA: Insufficient documentation

## 2013-10-11 DIAGNOSIS — R21 Rash and other nonspecific skin eruption: Secondary | ICD-10-CM | POA: Insufficient documentation

## 2013-10-11 DIAGNOSIS — I10 Essential (primary) hypertension: Secondary | ICD-10-CM | POA: Insufficient documentation

## 2013-10-11 DIAGNOSIS — E039 Hypothyroidism, unspecified: Secondary | ICD-10-CM | POA: Insufficient documentation

## 2013-10-11 DIAGNOSIS — N390 Urinary tract infection, site not specified: Secondary | ICD-10-CM | POA: Insufficient documentation

## 2013-10-11 DIAGNOSIS — IMO0002 Reserved for concepts with insufficient information to code with codable children: Secondary | ICD-10-CM | POA: Insufficient documentation

## 2013-10-11 DIAGNOSIS — R5383 Other fatigue: Secondary | ICD-10-CM

## 2013-10-11 DIAGNOSIS — Z79899 Other long term (current) drug therapy: Secondary | ICD-10-CM | POA: Insufficient documentation

## 2013-10-11 DIAGNOSIS — Z8669 Personal history of other diseases of the nervous system and sense organs: Secondary | ICD-10-CM | POA: Insufficient documentation

## 2013-10-11 HISTORY — DX: Essential (primary) hypertension: I10

## 2013-10-11 LAB — URINE MICROSCOPIC-ADD ON

## 2013-10-11 LAB — CBC
HCT: 36.8 % (ref 36.0–46.0)
HEMOGLOBIN: 12.7 g/dL (ref 12.0–15.0)
MCH: 32.6 pg (ref 26.0–34.0)
MCHC: 34.5 g/dL (ref 30.0–36.0)
MCV: 94.6 fL (ref 78.0–100.0)
PLATELETS: 183 10*3/uL (ref 150–400)
RBC: 3.89 MIL/uL (ref 3.87–5.11)
RDW: 12.8 % (ref 11.5–15.5)
WBC: 8.8 10*3/uL (ref 4.0–10.5)

## 2013-10-11 LAB — BASIC METABOLIC PANEL
BUN: 24 mg/dL — ABNORMAL HIGH (ref 6–23)
CHLORIDE: 103 meq/L (ref 96–112)
CO2: 22 meq/L (ref 19–32)
CREATININE: 1.1 mg/dL (ref 0.50–1.10)
Calcium: 9.6 mg/dL (ref 8.4–10.5)
GFR calc Af Amer: 61 mL/min — ABNORMAL LOW (ref >90)
GFR calc non Af Amer: 53 mL/min — ABNORMAL LOW (ref >90)
GLUCOSE: 89 mg/dL (ref 70–99)
Potassium: 3.7 mEq/L (ref 3.7–5.3)
SODIUM: 140 meq/L (ref 137–147)

## 2013-10-11 LAB — URINALYSIS, ROUTINE W REFLEX MICROSCOPIC
BILIRUBIN URINE: NEGATIVE
Glucose, UA: NEGATIVE mg/dL
Ketones, ur: NEGATIVE mg/dL
Leukocytes, UA: NEGATIVE
Nitrite: NEGATIVE
PH: 5.5 (ref 5.0–8.0)
Protein, ur: NEGATIVE mg/dL
SPECIFIC GRAVITY, URINE: 1.023 (ref 1.005–1.030)
UROBILINOGEN UA: 0.2 mg/dL (ref 0.0–1.0)

## 2013-10-11 MED ORDER — CEPHALEXIN 250 MG PO CAPS
500.0000 mg | ORAL_CAPSULE | Freq: Once | ORAL | Status: AC
  Administered 2013-10-11: 500 mg via ORAL
  Filled 2013-10-11: qty 2

## 2013-10-11 MED ORDER — CEPHALEXIN 500 MG PO CAPS: 500.0000 mg | ORAL_CAPSULE | Freq: Four times a day (QID) | ORAL | Status: DC

## 2013-10-11 NOTE — ED Notes (Signed)
Pt reports 3 weeks of feeling like she may have a uti also concerned that for 6 months has been having a weak feeling in her legs mainly from knees up she states she already is seeing a neurologist for a pinched nerve and has mentioned the leg weakness to them. Pt ambulates with steady quick gait to exam room and to the restroom to obtain urine specimen.

## 2013-10-11 NOTE — ED Notes (Signed)
Warm Blanket Provided.

## 2013-10-11 NOTE — ED Provider Notes (Signed)
CSN: [id number]     Arrival date & time 10/11/13  0712 History   First MD Initiated Contact with Patient 10/11/13 [phone]     Chief Complaint  Patient presents with  . poss uti    . Extremity Weakness     (Consider location/radiation/quality/duration/timing/severity/associated sxs/prior Treatment) HPI Pt presents with multiple complaints.  She states that she has been having burning with urination over the past several months and frequent urination.  No vomiting or fever/chills.  No abdominal pain. She also c/o feeling that her lower legs have been more weak than usual- mostly in thigh area for approx 5 months.  She is seeing a neurologist and has been evaluated for this by them as well.  She also mentions that yesterday she had a rash on her legs but this has gone away.  No incontinence of bowel or bladder, no new weakness of extremities.  There are no other associated systemic symptoms, there are no other alleviating or modifying factors.   Past Medical History  Diagnosis Date  . Crohn's colitis   . Neuropathy   . Hypothyroid   . Sleep apnea   . Hypertension    Past Surgical History  Procedure Laterality Date  . Colon surgery      for Crohn's in the 88s in [place]  . Hip replacement       Rt hip in 2010 in [place]  . Cholecystectomy    . Abdominal hysterectomy    . Colonoscopy N/A 03/11/2013    Procedure: COLONOSCOPY;  Surgeon: Rogene Houston, MD;  [facility];  Service: Endoscopy;  Laterality: N/A;  1200   Family History  Problem Relation Age of Onset  . Crohn's disease Brother   . Colon cancer Neg Hx    History  Substance Use Topics  . Smoking status: Never Smoker   . Smokeless tobacco: Not on file  . Alcohol Use: No   OB History   Grav Para Term Preterm Abortions TAB SAB Ect Mult Living                 Review of Systems ROS reviewed and all otherwise negative except for mentioned in HPI    Allergies  Morphine and related  Home Medications    Current Outpatient Rx  Name  Route  Sig  Dispense  Refill  . aspirin 81 MG tablet   Oral   Take 81 mg by mouth daily.         . calcium-vitamin D (OSCAL WITH D) 500-200 MG-UNIT per tablet   Oral   Take 1 tablet by mouth daily with breakfast.          . cephALEXin (KEFLEX) 500 MG capsule   Oral   Take 1 capsule (500 mg total) by mouth 4 (four) times daily.   28 capsule   0   . cholecalciferol (VITAMIN D) 1000 UNITS tablet   Oral   Take 1,000 Units by mouth 2 (two) times daily before a meal.          . dicyclomine (BENTYL) 10 MG capsule   Oral   Take 1 capsule (10 mg total) by mouth 4 (four) times daily -  before meals and at bedtime.   90 capsule   2   . fluticasone (FLONASE) 50 MCG/ACT nasal spray   Nasal   Place 2 sprays into the nose daily.         Marland Kitchen gabapentin (NEURONTIN) 300 MG capsule   Oral  Take 1 capsule by mouth 3 (three) times daily.         Marland Kitchen levothyroxine (SYNTHROID) 112 MCG tablet   Oral   Take 112 mcg by mouth daily before breakfast.         . lisinopril (PRINIVIL,ZESTRIL) 20 MG tablet   Oral   Take 20 mg by mouth daily.         . mesalamine (PENTASA) 500 MG CR capsule   Oral   Take 2 capsules (1,000 mg total) by mouth 4 (four) times daily.   240 capsule   11   . omeprazole (PRILOSEC) 20 MG capsule   Oral   Take 20 mg by mouth daily.         . sertraline (ZOLOFT) 50 MG tablet   Oral   Take 50 mg by mouth daily.         . vitamin B-12 (CYANOCOBALAMIN) 100 MCG tablet   Oral   Take 50 mcg by mouth daily.         . Vitamin D, Ergocalciferol, (DRISDOL) 50000 UNITS CAPS capsule   Oral   Take 1 capsule by mouth once a week.          BP 153/91  Pulse 94  Temp(Src) 97.7 F (36.5 C) (Oral)  Resp 18  Ht 5' 2"  (1.575 m)  Wt 183 lb (83.008 kg)  BMI 33.46 kg/m2  SpO2 100% Vitals reviewed Physical Exam Physical Examination: General appearance - alert, well appearing, and in no distress Mental status - alert,  oriented to person, place, and time Eyes - no conjunctival injection, no scleral icterus Mouth - mucous membranes moist, pharynx normal without lesions Chest - clear to auscultation, no wheezes, rales or rhonchi, symmetric air entry Heart - normal rate, regular rhythm, normal S1, S2, no murmurs, rubs, clicks or gallops Abdomen - soft, nontender, nondistended, no masses or organomegaly Back exam - full range of motion, no tenderness, palpable spasm or pain on motion Neurological - alert, oriented, normal speech, strength 5/5 in extremities, sensation intact Musculoskeletal - no joint tenderness, deformity or swelling Extremities - peripheral pulses normal, no pedal edema, no clubbing or cyanosis Skin - normal coloration and turgor, no rashes  ED Course  Procedures (including critical care time) Labs Review Labs Reviewed  URINALYSIS, ROUTINE W REFLEX MICROSCOPIC - Abnormal; Notable for the following:    APPearance CLOUDY (*)    Hgb urine dipstick TRACE (*)    All other components within normal limits  BASIC METABOLIC PANEL - Abnormal; Notable for the following:    BUN 24 (*)    GFR calc non Af Amer 53 (*)    GFR calc Af Amer 61 (*)    All other components within normal limits  URINE MICROSCOPIC-ADD ON - Abnormal; Notable for the following:    Squamous Epithelial / LPF FEW (*)    Bacteria, UA MANY (*)    All other components within normal limits  URINE CULTURE  CBC   Imaging Review No results found.   EKG Interpretation None      MDM   Final diagnoses:  Urinary tract infection    Pt presenting with c/o dysuria, weakness in legs that has been ongoing for several months associated with low back pain, she is seeing neurology for this.  Will treat UTI with keflex.   Patient is overall nontoxic and well hydrated in appearance.      Threasa Beards, MD 10/16/13 [phone]

## 2013-10-11 NOTE — ED Notes (Signed)
MD at bedside. 

## 2013-10-11 NOTE — Discharge Instructions (Signed)
Return to the ED with any concerns including fever/chills, abdominal pain, vomiting and not able to keep down liquids or antibiotic, decreased level of alertness/lethargy, or any other alarming symptoms

## 2013-10-13 LAB — URINE CULTURE: Colony Count: 75000

## 2013-10-15 NOTE — ED Notes (Signed)
Med Rec Request sent from [facility] [phone]. Records was faxed. Unable to scan med rec req due to no scan access.

## 2014-04-27 ENCOUNTER — Encounter (INDEPENDENT_AMBULATORY_CARE_PROVIDER_SITE_OTHER): Payer: Self-pay | Admitting: *Deleted

## 2014-06-19 ENCOUNTER — Encounter (HOSPITAL_COMMUNITY): Payer: Self-pay | Admitting: Emergency Medicine

## 2014-06-19 ENCOUNTER — Emergency Department (HOSPITAL_COMMUNITY)
Admission: EM | Admit: 2014-06-19 | Discharge: 2014-06-19 | Disposition: A | Discharge status: Home / Self Care | Payer: BC Managed Care – PPO | Attending: Emergency Medicine | Admitting: Emergency Medicine

## 2014-06-19 DIAGNOSIS — G4730 Sleep apnea, unspecified: Secondary | ICD-10-CM | POA: Diagnosis not present

## 2014-06-19 DIAGNOSIS — Z7951 Long term (current) use of inhaled steroids: Secondary | ICD-10-CM | POA: Diagnosis not present

## 2014-06-19 DIAGNOSIS — G8929 Other chronic pain: Secondary | ICD-10-CM | POA: Diagnosis not present

## 2014-06-19 DIAGNOSIS — K5010 Crohn's disease of large intestine without complications: Secondary | ICD-10-CM | POA: Insufficient documentation

## 2014-06-19 DIAGNOSIS — F329 Major depressive disorder, single episode, unspecified: Secondary | ICD-10-CM | POA: Diagnosis not present

## 2014-06-19 DIAGNOSIS — F419 Anxiety disorder, unspecified: Secondary | ICD-10-CM | POA: Diagnosis not present

## 2014-06-19 DIAGNOSIS — I10 Essential (primary) hypertension: Secondary | ICD-10-CM | POA: Insufficient documentation

## 2014-06-19 DIAGNOSIS — Z79899 Other long term (current) drug therapy: Secondary | ICD-10-CM | POA: Insufficient documentation

## 2014-06-19 DIAGNOSIS — G629 Polyneuropathy, unspecified: Secondary | ICD-10-CM | POA: Insufficient documentation

## 2014-06-19 DIAGNOSIS — M549 Dorsalgia, unspecified: Secondary | ICD-10-CM | POA: Insufficient documentation

## 2014-06-19 DIAGNOSIS — F1110 Opioid abuse, uncomplicated: Secondary | ICD-10-CM | POA: Insufficient documentation

## 2014-06-19 DIAGNOSIS — E039 Hypothyroidism, unspecified: Secondary | ICD-10-CM | POA: Insufficient documentation

## 2014-06-19 DIAGNOSIS — Z7982 Long term (current) use of aspirin: Secondary | ICD-10-CM | POA: Insufficient documentation

## 2014-06-19 DIAGNOSIS — F4320 Adjustment disorder, unspecified: Secondary | ICD-10-CM | POA: Insufficient documentation

## 2014-06-19 DIAGNOSIS — R5383 Other fatigue: Secondary | ICD-10-CM | POA: Diagnosis present

## 2014-06-19 HISTORY — DX: Major depressive disorder, single episode, unspecified: F32.9

## 2014-06-19 HISTORY — DX: Depression, unspecified: F32.A

## 2014-06-19 HISTORY — DX: Dorsalgia, unspecified: M54.9

## 2014-06-19 HISTORY — DX: Other chronic pain: G89.29

## 2014-06-19 LAB — URINALYSIS, ROUTINE W REFLEX MICROSCOPIC
Bilirubin Urine: NEGATIVE
GLUCOSE, UA: NEGATIVE mg/dL
KETONES UR: NEGATIVE mg/dL
LEUKOCYTES UA: NEGATIVE
Nitrite: NEGATIVE
PROTEIN: NEGATIVE mg/dL
Specific Gravity, Urine: 1.024 (ref 1.005–1.030)
Urobilinogen, UA: 0.2 mg/dL (ref 0.0–1.0)
pH: 5 (ref 5.0–8.0)

## 2014-06-19 LAB — COMPREHENSIVE METABOLIC PANEL
ALT: 21 U/L (ref 0–35)
ANION GAP: 12 (ref 5–15)
AST: 23 U/L (ref 0–37)
Albumin: 4 g/dL (ref 3.5–5.2)
Alkaline Phosphatase: 132 U/L — ABNORMAL HIGH (ref 39–117)
BILIRUBIN TOTAL: 0.5 mg/dL (ref 0.3–1.2)
BUN: 26 mg/dL — AB (ref 6–23)
CHLORIDE: 99 meq/L (ref 96–112)
CO2: 27 meq/L (ref 19–32)
CREATININE: 1 mg/dL (ref 0.50–1.10)
Calcium: 10 mg/dL (ref 8.4–10.5)
GFR calc Af Amer: 69 mL/min — ABNORMAL LOW (ref >90)
GFR, EST NON AFRICAN AMERICAN: 60 mL/min — AB (ref >90)
GLUCOSE: 123 mg/dL — AB (ref 70–99)
Potassium: 3.9 mEq/L (ref 3.7–5.3)
Sodium: 138 mEq/L (ref 137–147)
Total Protein: 7.7 g/dL (ref 6.0–8.3)

## 2014-06-19 LAB — CBC WITH DIFFERENTIAL/PLATELET
Basophils Absolute: 0 10*3/uL (ref 0.0–0.1)
Basophils Relative: 0 % (ref 0–1)
Eosinophils Absolute: 0.1 10*3/uL (ref 0.0–0.7)
Eosinophils Relative: 1 % (ref 0–5)
HEMATOCRIT: 40.9 % (ref 36.0–46.0)
HEMOGLOBIN: 13.9 g/dL (ref 12.0–15.0)
LYMPHS ABS: 2.9 10*3/uL (ref 0.7–4.0)
LYMPHS PCT: 26 % (ref 12–46)
MCH: 31.6 pg (ref 26.0–34.0)
MCHC: 34 g/dL (ref 30.0–36.0)
MCV: 93 fL (ref 78.0–100.0)
MONO ABS: 0.9 10*3/uL (ref 0.1–1.0)
MONOS PCT: 8 % (ref 3–12)
NEUTROS ABS: 7.2 10*3/uL (ref 1.7–7.7)
Neutrophils Relative %: 65 % (ref 43–77)
Platelets: 261 10*3/uL (ref 150–400)
RBC: 4.4 MIL/uL (ref 3.87–5.11)
RDW: 12.8 % (ref 11.5–15.5)
WBC: 11.1 10*3/uL — AB (ref 4.0–10.5)

## 2014-06-19 LAB — RAPID URINE DRUG SCREEN, HOSP PERFORMED
AMPHETAMINES: NOT DETECTED
BARBITURATES: NOT DETECTED
BENZODIAZEPINES: NOT DETECTED
Cocaine: NOT DETECTED
Opiates: POSITIVE — AB
Tetrahydrocannabinol: NOT DETECTED

## 2014-06-19 LAB — ETHANOL: Alcohol, Ethyl (B): <11 mg/dL (ref 0–11)

## 2014-06-19 LAB — URINE MICROSCOPIC-ADD ON

## 2014-06-19 MED ORDER — ALPRAZOLAM 0.25 MG PO TABS
0.2500 mg | ORAL_TABLET | Freq: Once | ORAL | Status: AC
  Administered 2014-06-19: 0.25 mg via ORAL
  Filled 2014-06-19: qty 1

## 2014-06-19 NOTE — ED Notes (Signed)
MD at bedside. 

## 2014-06-19 NOTE — ED Notes (Signed)
Awake. Verbally responsive. A/O x4. Resp even and unlabored. No audible adventitious breath sounds noted. ABC's intact. NAD noted.

## 2014-06-19 NOTE — ED Provider Notes (Signed)
Pt was evaluated by TTS.  Pt is not suicidal, contracts for safety.  Had a stressful situation at work.  Stable for outpatient follow up.  Dorie Rank, MD 06/19/14 [phone]

## 2014-06-19 NOTE — ED Notes (Signed)
Awake. Verbally responsive. A/O x4. Resp even and unlabored. No audible adventitious breath sounds noted. ABC's intact. Pt very tearful when speaking.

## 2014-06-19 NOTE — ED Notes (Signed)
Awake. Verbally responsive. A/O x4. Resp even and unlabored. No audible adventitious breath sounds noted. ABC's intact. Family at bedside.
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
## 2014-06-19 NOTE — ED Notes (Signed)
Bed: JG50 Expected date:  Expected time:  Means of arrival:  Comments: No bed/monitor

## 2014-06-19 NOTE — ED Notes (Signed)
Pt reported having fatigue and difficulty sleeping. Pt stated that she knows she is sleeping but she always feeling tired. Pt tearful during interview. She has f/u with PMD and increase thyroid medication on 2 weeks. Reported generalized chronic pain.

## 2014-06-19 NOTE — Discharge Instructions (Signed)

## 2014-06-19 NOTE — ED Provider Notes (Signed)
CSN: [id number]     Arrival date & time 06/19/14  0351 History   First MD Initiated Contact with Patient 06/19/14 0413     Chief Complaint  Patient presents with  . Fatigue     (Consider location/radiation/quality/duration/timing/severity/associated sxs/prior Treatment) HPI Patient presents with acute anxiety and tearfulness. She was at work and started while on a break. Husband states that earlier in the evening she was acting fine. He is concerned she is having a "nervous breakdown". Patient has a history of depression. She was recently switched to Cymbalta and has been noncompliant with her medication. She is not having specific suicidal ideation but has made the statement that she is tired or being tired and she doesn't know if she can take much more. She has no plan for suicide attempt. No previous suicide attempts. She has ongoing chronic pain in her back. This is unchanged. No focal weakness or numbness. No urinary symptoms. Past Medical History  Diagnosis Date  . Crohn's colitis   . Neuropathy   . Hypothyroid   . Sleep apnea   . Hypertension   . Back pain, chronic   . Depression (emotion)    Past Surgical History  Procedure Laterality Date  . Colon surgery      for Crohn's in the 56s in [place]  . Hip replacement       Rt hip in 2010 in [place]  . Cholecystectomy    . Abdominal hysterectomy    . Colonoscopy N/A 03/11/2013    Procedure: COLONOSCOPY;  Surgeon: Rogene Houston, MD;  Location: [facility];  Service: Endoscopy;  Laterality: N/A;  1200   Family History  Problem Relation Age of Onset  . Crohn's disease Brother   . Colon cancer Neg Hx    History  Substance Use Topics  . Smoking status: Never Smoker   . Smokeless tobacco: Not on file  . Alcohol Use: No   OB History    No data available     Review of Systems  Constitutional: Positive for fatigue. Negative for fever and chills.  Respiratory: Negative for shortness of breath.   Cardiovascular:  Negative for chest pain.  Gastrointestinal: Negative for nausea, vomiting and abdominal pain.  Genitourinary: Negative for dysuria and difficulty urinating.  Musculoskeletal: Positive for back pain.  Skin: Negative for rash and wound.  Neurological: Negative for dizziness, syncope, weakness, light-headedness, numbness and headaches.  Psychiatric/Behavioral: Positive for dysphoric mood. Negative for suicidal ideas, hallucinations and self-injury. The patient is nervous/anxious.   All other systems reviewed and are negative.     Allergies  Morphine and related  Home Medications   Prior to Admission medications   Medication Sig Start Date End Date Taking? Authorizing Provider  aspirin EC 81 MG tablet Take 81 mg by mouth daily.   Yes Historical Provider, MD  DULoxetine (CYMBALTA) 60 MG capsule Take 60 mg by mouth 2 (two) times daily.   Yes Historical Provider, MD  fexofenadine (ALLEGRA) 180 MG tablet Take 180 mg by mouth daily. 04/27/14  Yes Historical Provider, MD  fluticasone (FLONASE) 50 MCG/ACT nasal spray Place 2 sprays into the nose daily.   Yes Historical Provider, MD  gabapentin (NEURONTIN) 300 MG capsule Take 300 mg by mouth 3 (three) times daily.  01/28/13  Yes Historical Provider, MD  levothyroxine (SYNTHROID) 112 MCG tablet Take 112 mcg by mouth daily before breakfast.   Yes Historical Provider, MD  lisinopril (PRINIVIL,ZESTRIL) 20 MG tablet Take 20 mg by mouth daily.  Yes Historical Provider, MD  Multiple Vitamin (MULTIVITAMIN WITH MINERALS) TABS tablet Take 1 tablet by mouth daily.   Yes Historical Provider, MD  omeprazole (PRILOSEC) 20 MG capsule Take 20 mg by mouth daily.   Yes Historical Provider, MD  cephALEXin (KEFLEX) 500 MG capsule Take 1 capsule (500 mg total) by mouth 4 (four) times daily. Patient not taking: Reported on 06/19/2014 10/11/13   Threasa Beards, MD  dicyclomine (BENTYL) 10 MG capsule Take 1 capsule (10 mg total) by mouth 4 (four) times daily -  before  meals and at bedtime. Patient not taking: Reported on 06/19/2014 02/10/13   Butch Penny, NP  mesalamine (PENTASA) 500 MG CR capsule Take 2 capsules (1,000 mg total) by mouth 4 (four) times daily. Patient not taking: Reported on 06/19/2014 03/11/13   Rogene Houston, MD   BP 161/89 mmHg  Pulse 93  Temp(Src) 98.1 F (36.7 C) (Oral)  Resp 18  Ht 5' 2"  (1.575 m)  Wt 178 lb (80.74 kg)  BMI 32.55 kg/m2  SpO2 98% Physical Exam  Constitutional: She is oriented to person, place, and time. She appears well-developed and well-nourished. No distress.  Very tearful and anxious  HENT:  Head: Normocephalic and atraumatic.  Mouth/Throat: Oropharynx is clear and moist. No oropharyngeal exudate.  Eyes: EOM are normal. Pupils are equal, round, and reactive to light.  Neck: Normal range of motion. Neck supple. No thyromegaly present.  Cardiovascular: Normal rate and regular rhythm.  Exam reveals no gallop and no friction rub.   No murmur heard. Pulmonary/Chest: Effort normal and breath sounds normal. No respiratory distress. She has no wheezes. She has no rales. She exhibits no tenderness.  Abdominal: Soft. Bowel sounds are normal. She exhibits no distension and no mass. There is no tenderness. There is no rebound and no guarding.  Musculoskeletal: Normal range of motion. She exhibits no edema or tenderness.  Neurological: She is alert and oriented to person, place, and time.  Moves all extremities without deficit. Sensation is intact.  Skin: Skin is warm and dry. No rash noted. No erythema.  Psychiatric:  Dysphoric mood. Denies suicidal ideation or current plan.  Nursing note and vitals reviewed.   ED Course  Procedures (including critical care time) Labs Review Labs Reviewed  CBC WITH DIFFERENTIAL - Abnormal; Notable for the following:    WBC 11.1 (*)    All other components within normal limits  COMPREHENSIVE METABOLIC PANEL - Abnormal; Notable for the following:    Glucose, Bld 123 (*)     BUN 26 (*)    Alkaline Phosphatase 132 (*)    GFR calc non Af Amer 60 (*)    GFR calc Af Amer 69 (*)    All other components within normal limits  URINALYSIS, ROUTINE W REFLEX MICROSCOPIC - Abnormal; Notable for the following:    Hgb urine dipstick TRACE (*)    All other components within normal limits  URINE RAPID DRUG SCREEN (HOSP PERFORMED) - Abnormal; Notable for the following:    Opiates POSITIVE (*)    All other components within normal limits  ETHANOL  URINE MICROSCOPIC-ADD ON    Imaging Review No results found.   EKG Interpretation None      MDM   Final diagnoses:  Anxiety   Patient is medically cleared for psychiatric evaluation.    Julianne Rice, MD 06/20/14 [phone]

## 2014-08-17 ENCOUNTER — Ambulatory Visit (INDEPENDENT_AMBULATORY_CARE_PROVIDER_SITE_OTHER): Payer: BC Managed Care – PPO | Admitting: Psychiatry

## 2014-08-17 ENCOUNTER — Encounter (HOSPITAL_COMMUNITY): Payer: Self-pay | Admitting: Psychiatry

## 2014-08-17 VITALS — BP 118/90 | HR 100 | Ht 62.0 in | Wt 188.4 lb

## 2014-08-17 DIAGNOSIS — F411 Generalized anxiety disorder: Secondary | ICD-10-CM

## 2014-08-17 DIAGNOSIS — F331 Major depressive disorder, recurrent, moderate: Secondary | ICD-10-CM

## 2014-08-17 MED ORDER — SERTRALINE HCL 100 MG PO TABS: 100.0000 mg | ORAL_TABLET | Freq: Every day | ORAL | Status: DC

## 2014-08-17 MED ORDER — GABAPENTIN 300 MG PO CAPS: 300.0000 mg | ORAL_CAPSULE | Freq: Three times a day (TID) | ORAL | Status: DC
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
## 2014-09-12 ENCOUNTER — Ambulatory Visit (INDEPENDENT_AMBULATORY_CARE_PROVIDER_SITE_OTHER): Payer: 59 | Admitting: Psychiatry

## 2014-09-12 ENCOUNTER — Encounter (HOSPITAL_COMMUNITY): Payer: Self-pay | Admitting: Psychiatry

## 2014-09-12 VITALS — BP 124/79 | HR 78 | Ht 62.0 in | Wt 193.8 lb

## 2014-09-12 DIAGNOSIS — F331 Major depressive disorder, recurrent, moderate: Secondary | ICD-10-CM

## 2014-09-12 DIAGNOSIS — F411 Generalized anxiety disorder: Secondary | ICD-10-CM
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
## 2014-09-13 ENCOUNTER — Encounter (INDEPENDENT_AMBULATORY_CARE_PROVIDER_SITE_OTHER): Payer: Self-pay | Admitting: Internal Medicine

## 2014-09-13 ENCOUNTER — Ambulatory Visit (INDEPENDENT_AMBULATORY_CARE_PROVIDER_SITE_OTHER): Payer: 59 | Admitting: Internal Medicine

## 2014-09-13 VITALS — BP 130/70 | HR 86 | Temp 98.6°F | Resp 18 | Ht 62.0 in | Wt 194.0 lb

## 2014-09-13 DIAGNOSIS — K50918 Crohn's disease, unspecified, with other complication: Secondary | ICD-10-CM

## 2014-09-13 DIAGNOSIS — R197 Diarrhea, unspecified: Secondary | ICD-10-CM

## 2014-09-13 MED ORDER — DIPHENOXYLATE-ATROPINE 2.5-0.025 MG PO TABS: 1.0000 | ORAL_TABLET | Freq: Three times a day (TID) | ORAL | Status: DC | PRN

## 2014-09-13 MED ORDER — DICYCLOMINE HCL 10 MG PO CAPS: ORAL_CAPSULE | ORAL | Status: DC

## 2014-09-13 NOTE — Patient Instructions (Signed)
Physician will call with results of MR enterography when completed

## 2014-09-13 NOTE — Progress Notes (Signed)
Presenting complaint;  Follow-up for Crohn's disease. She complains of diarrhea and lower abdominal pain.  Subjective:  Patient is 64 year old Caucasian female who has history of small bowel Crohn's disease and is here for scheduled visit. She was last seen in the office on 02/10/2013 and following that visit she underwent colonoscopy on 03/11/2013 revealing noncritical narrowing at ileocolonic anastomosis with single small ulcer.  Patient was given prescription for Pentasa and she is not taking anymore. Patient says she is not doing well. She was having back problems and was switched from Zoloft to Cymbalta by her back specialist in [facility] and she almost had noticed breakdown. She was seen in emergency room about 2 months ago. She is back on Zoloft and starting to feel better. She has multiple GI complaints. She has daily diarrhea with 5-6 stools per day. All of her stools are loose and watery. She has 1-2 bowel movements at night. She denies melena or rectal bleed she complains of pain across her lower abdomen. She says Lomotil is not helping. She denies fever chills nausea or vomiting. She states heartburn is well controlled with PPI. She has good appetite. She has gained 8 pounds since her last visit. She states she gained most of the weight during winter months. She had physical exam by Dr. [name] few weeks ago and had blood work. She was also given Hemoccults but she has not taken these cards back.   Current Medications: Outpatient Encounter Prescriptions as of 09/13/2014  Medication Sig  . aspirin EC 81 MG tablet Take 81 mg by mouth daily.  . diphenoxylate-atropine (LOMOTIL) 2.5-0.025 MG per tablet Take 2 tablets by mouth 4 (four) times daily as needed for diarrhea or loose stools.  . fexofenadine (ALLEGRA) 180 MG tablet Take 180 mg by mouth daily.  . fluticasone (FLONASE) 50 MCG/ACT nasal spray Place 2 sprays into the nose daily.  Marland Kitchen gabapentin (NEURONTIN) 300 MG capsule  Take 1 capsule (300 mg total) by mouth 3 (three) times daily.  Marland Kitchen HYDROcodone-acetaminophen (NORCO) 10-325 MG per tablet Take 1 tablet by mouth as needed.  Marland Kitchen levothyroxine (SYNTHROID) 112 MCG tablet Take 100 mcg by mouth daily before breakfast.   . lisinopril (PRINIVIL,ZESTRIL) 20 MG tablet Take 20 mg by mouth daily.  . Multiple Vitamin (MULTIVITAMIN WITH MINERALS) TABS tablet Take 1 tablet by mouth daily.  Marland Kitchen omeprazole (PRILOSEC) 20 MG capsule Take 20 mg by mouth 2 (two) times daily.   . sertraline (ZOLOFT) 100 MG tablet Take 1 tablet (100 mg total) by mouth daily.  Marland Kitchen tiZANidine (ZANAFLEX) 4 MG tablet Take 4 mg by mouth at bedtime.  . [DISCONTINUED] diclofenac (VOLTAREN) 75 MG EC tablet Take 75 mg by mouth as needed.     Objective: Blood pressure 130/70, pulse 86, temperature 98.6 F (37 C), temperature source Oral, resp. rate 18, height 5' 2"  (1.575 m), weight 194 lb (87.998 kg). Patient is alert and in no acute distress. Conjunctiva is pink. Sclera is nonicteric Oropharyngeal mucosa is normal. No neck masses or thyromegaly noted. Cardiac exam with regular rhythm normal S1 and S2. No murmur or gallop noted. Lungs are clear to auscultation. Abdomen is full. Bowel sounds are normal. On palpation abdomen is soft with mild generalized tenderness which is most pronounced in right lower quadrant. No organomegaly or masses.  No LE edema or clubbing noted.  Labs/studies Results: Lab studies from 06/19/2014   WBC 11.1, H&H 13.9 and 40.9 and platelet count 261K Bilirubin 0.4, AP 132, AST 23,  ALT 21 and albumin 4.0 Serum calcium 10 BUN 26 and creatinine 1.0  Assessment:  #1. History of small bowel Crohn's disease. 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 She is having diarrhea with urgency and lower abdominal pain. These symptoms could be due to relapse of Crohn's disease but associated IBS.  Plan:  Patient encouraged to do Hemoccults as soon as she could. She has skipped  which was given to her by PCP. Request recent lab from [facility] internal medicine. MR enterography. Diphenoxylate 1 tablet by mouth 3 times a day when necessary. Dicyclomine 10 mg by mouth 3 times a day when necessary. Further recommendations to follow.

## 2014-09-15 ENCOUNTER — Telehealth (INDEPENDENT_AMBULATORY_CARE_PROVIDER_SITE_OTHER): Payer: Self-pay | Admitting: *Deleted

## 2014-09-15 ENCOUNTER — Encounter (INDEPENDENT_AMBULATORY_CARE_PROVIDER_SITE_OTHER): Payer: Self-pay | Admitting: *Deleted

## 2014-09-15 NOTE — Telephone Encounter (Signed)
sch'd MR enterography 09/23/14, when I called patient to give appt info she stated she wanted to cancel that she was retiring and didn't need additional bills

## 2014-09-16 NOTE — Telephone Encounter (Signed)
Can cancel MR

## 2014-09-23 ENCOUNTER — Other Ambulatory Visit (HOSPITAL_COMMUNITY): Payer: Self-pay

## 2014-10-27 ENCOUNTER — Encounter (INDEPENDENT_AMBULATORY_CARE_PROVIDER_SITE_OTHER): Payer: Self-pay

## 2014-11-08 ENCOUNTER — Encounter (HOSPITAL_COMMUNITY): Payer: Self-pay | Admitting: Psychiatry

## 2014-11-08 ENCOUNTER — Ambulatory Visit (INDEPENDENT_AMBULATORY_CARE_PROVIDER_SITE_OTHER): Payer: 59 | Admitting: Psychiatry

## 2014-11-08 VITALS — BP 140/85 | HR 68 | Ht 62.0 in | Wt 188.0 lb

## 2014-11-08 DIAGNOSIS — F411 Generalized anxiety disorder: Secondary | ICD-10-CM

## 2014-11-08 DIAGNOSIS — F331 Major depressive disorder, recurrent, moderate: Secondary | ICD-10-CM

## 2014-11-08 MED ORDER — SERTRALINE HCL 100 MG PO TABS: 100.0000 mg | ORAL_TABLET | Freq: Every day | ORAL | Status: DC

## 2014-11-08 MED ORDER — GABAPENTIN 300 MG PO CAPS: 300.0000 mg | ORAL_CAPSULE | Freq: Three times a day (TID) | ORAL | Status: DC
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
## 2015-02-08 ENCOUNTER — Ambulatory Visit (HOSPITAL_COMMUNITY): Payer: Self-pay | Admitting: Psychiatry

## 2015-02-14 ENCOUNTER — Ambulatory Visit (HOSPITAL_COMMUNITY): Payer: Self-pay | Admitting: Psychiatry

## 2015-03-16 ENCOUNTER — Ambulatory Visit: Payer: Self-pay | Admitting: Orthopedic Surgery

## 2015-03-16 NOTE — Progress Notes (Signed)
Preoperative surgical orders have been place into the Epic hospital system for Jessica [name] on 03/16/2015, 11:54 AM  by Mickel Crow for surgery on 04-12-2015.  Preop Knee Scope orders including IV Tylenol and IV Decadron as long as there are no contraindications to the above medications. [name], PA-C

## 2015-03-21 ENCOUNTER — Encounter (HOSPITAL_COMMUNITY): Payer: Self-pay | Admitting: Psychiatry

## 2015-03-21 ENCOUNTER — Ambulatory Visit (INDEPENDENT_AMBULATORY_CARE_PROVIDER_SITE_OTHER): Payer: Self-pay | Admitting: Internal Medicine

## 2015-03-21 ENCOUNTER — Ambulatory Visit: Payer: Self-pay | Admitting: Orthopedic Surgery

## 2015-03-21 ENCOUNTER — Ambulatory Visit (INDEPENDENT_AMBULATORY_CARE_PROVIDER_SITE_OTHER): Payer: Self-pay | Admitting: Psychiatry

## 2015-03-21 VITALS — BP 125/77 | HR 84 | Ht 62.0 in | Wt 181.4 lb

## 2015-03-21 DIAGNOSIS — F411 Generalized anxiety disorder: Secondary | ICD-10-CM

## 2015-03-21 DIAGNOSIS — F331 Major depressive disorder, recurrent, moderate: Secondary | ICD-10-CM

## 2015-03-21 MED ORDER — GABAPENTIN 300 MG PO CAPS: 300.0000 mg | ORAL_CAPSULE | Freq: Three times a day (TID) | ORAL | Status: DC

## 2015-03-21 MED ORDER — SERTRALINE HCL 100 MG PO TABS: 100.0000 mg | ORAL_TABLET | Freq: Every day | ORAL | Status: DC
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
## 2015-03-21 NOTE — Progress Notes (Signed)
Preoperative surgical orders have been place into the Epic hospital system for Jessica [name] on 03/21/2015, 12:01 PM  by Mickel Crow for surgery on 03-29-2015.  Preop Knee Scope orders including IV Tylenol and IV Decadron as long as there are no contraindications to the above medications. [name], PA-C

## 2015-03-23 NOTE — Patient Instructions (Addendum)
Jessica [name]  03/23/2015   Your procedure is scheduled on: 03/29/2015  Report to [facility] Main  Entrance take [facility]  elevators to 3rd floor to  [place] at      1250PM  Call this number if you have problems the morning of surgery [phone]   Remember: ONLY 1 PERSON MAY GO WITH YOU TO SHORT STAY TO GET  READY MORNING OF YOUR SURGERY.  Do not eat food AFTER MIDNITE.May have clear liquids from 12 midnite until 0800am of surgery then nothing by mouth.       Take these medicines the morning of surgery with A SIP OF WATER:  Zyrtec, Flonase, Hydrocodone if needed, Synthroid, Prilosec                                 You may not have any metal on your body including hair pins and              piercings  Do not wear jewelry, make-up, lotions, powders or perfumes, deodorant             Do not wear nail polish.  Do not shave  48 hours prior to surgery.                Do not bring valuables to the hospital. [place].  Contacts, dentures or bridgework may not be worn into surgery.      Patients discharged the day of surgery will not be allowed to drive home.  Name and phone number of your driver:  Special Instructions:  Coughing and deep breathing exercises, leg exercises               Please read over the following fact sheets you were given: _____________________________________________________________________             [facility] - Preparing for Surgery Before surgery, you can play an important role.  Because skin is not sterile, your skin needs to be as free of germs as possible.  You can reduce the number of germs on your skin by washing with CHG (chlorahexidine gluconate) soap before surgery.  CHG is an antiseptic cleaner which kills germs and bonds with the skin to continue killing germs even after washing. Please DO NOT use if you have an allergy to CHG or antibacterial soaps.  If your skin  becomes reddened/irritated stop using the CHG and inform your nurse when you arrive at Short Stay. Do not shave (including legs and underarms) for at least 48 hours prior to the first CHG shower.  You may shave your face/neck. Please follow these instructions carefully:  1.  Shower with CHG Soap the night before surgery and the  morning of Surgery.  2.  If you choose to wash your hair, wash your hair first as usual with your  normal  shampoo.  3.  After you shampoo, rinse your hair and body thoroughly to remove the  shampoo.                           4.  Use CHG as you would any other liquid soap.  You can apply chg directly  to the skin and wash  Gently with a scrungie or clean washcloth.  5.  Apply the CHG Soap to your body ONLY FROM THE NECK DOWN.   Do not use on face/ open                           Wound or open sores. Avoid contact with eyes, ears mouth and genitals (private parts).                       Wash face,  Genitals (private parts) with your normal soap.             6.  Wash thoroughly, paying special attention to the area where your surgery  will be performed.  7.  Thoroughly rinse your body with warm water from the neck down.  8.  DO NOT shower/wash with your normal soap after using and rinsing off  the CHG Soap.                9.  Pat yourself dry with a clean towel.            10.  Wear clean pajamas.            11.  Place clean sheets on your bed the night of your first shower and do not  sleep with pets. Day of Surgery : Do not apply any lotions/deodorants the morning of surgery.  Please wear clean clothes to the hospital/surgery center.  FAILURE TO FOLLOW THESE INSTRUCTIONS MAY RESULT IN THE CANCELLATION OF YOUR SURGERY PATIENT SIGNATURE_________________________________  NURSE SIGNATURE__________________________________  ________________________________________________________________________    CLEAR LIQUID DIET   Foods Allowed                                                                      Foods Excluded  Coffee and tea, regular and decaf                             liquids that you cannot  Plain Jell-O in any flavor                                             see through such as: Fruit ices (not with fruit pulp)                                     milk, soups, orange juice  Iced Popsicles                                    All solid food Carbonated beverages, regular and diet                                    Cranberry, grape and apple juices Sports drinks like Gatorade Lightly seasoned clear broth or consume(fat free) Sugar, honey syrup  Sample Menu Breakfast                                Lunch                                     Supper Cranberry juice                    Beef broth                            Chicken broth Jell-O                                     Grape juice                           Apple juice Coffee or tea                        Jell-O                                      Popsicle                                                Coffee or tea                        Coffee or tea  _____________________________________________________________________

## 2015-03-24 ENCOUNTER — Encounter (HOSPITAL_COMMUNITY): Payer: Self-pay

## 2015-03-24 ENCOUNTER — Encounter (HOSPITAL_COMMUNITY)
Admission: RE | Admit: 2015-03-24 | Discharge: 2015-03-24 | Disposition: A | Discharge status: Home / Self Care | Payer: 59 | Source: Ambulatory Visit | Attending: Orthopedic Surgery | Admitting: Orthopedic Surgery

## 2015-03-24 DIAGNOSIS — Z01818 Encounter for other preprocedural examination: Secondary | ICD-10-CM | POA: Diagnosis not present

## 2015-03-24 DIAGNOSIS — S83249A Other tear of medial meniscus, current injury, unspecified knee, initial encounter: Secondary | ICD-10-CM | POA: Insufficient documentation

## 2015-03-24 HISTORY — DX: Gastro-esophageal reflux disease without esophagitis: K21.9

## 2015-03-24 HISTORY — DX: Unspecified osteoarthritis, unspecified site: M19.90

## 2015-03-24 LAB — BASIC METABOLIC PANEL
ANION GAP: 7 (ref 5–15)
BUN: 12 mg/dL (ref 6–20)
CALCIUM: 9.3 mg/dL (ref 8.9–10.3)
CO2: 23 mmol/L (ref 22–32)
CREATININE: 1.17 mg/dL — AB (ref 0.44–1.00)
Chloride: 111 mmol/L (ref 101–111)
GFR calc Af Amer: 57 mL/min — ABNORMAL LOW (ref >60)
GFR, EST NON AFRICAN AMERICAN: 49 mL/min — AB (ref >60)
GLUCOSE: 94 mg/dL (ref 65–99)
Potassium: 4.3 mmol/L (ref 3.5–5.1)
Sodium: 141 mmol/L (ref 135–145)

## 2015-03-24 LAB — CBC
HCT: 33.5 % — ABNORMAL LOW (ref 36.0–46.0)
Hemoglobin: 11.3 g/dL — ABNORMAL LOW (ref 12.0–15.0)
MCH: 31.5 pg (ref 26.0–34.0)
MCHC: 33.7 g/dL (ref 30.0–36.0)
MCV: 93.3 fL (ref 78.0–100.0)
PLATELETS: 186 10*3/uL (ref 150–400)
RBC: 3.59 MIL/uL — ABNORMAL LOW (ref 3.87–5.11)
RDW: 12.9 % (ref 11.5–15.5)
WBC: 4.5 10*3/uL (ref 4.0–10.5)

## 2015-03-24 NOTE — Progress Notes (Signed)
BMP results done 03/24/15 faxed via EPIC to Dr Wynelle Link.

## 2015-03-27 ENCOUNTER — Other Ambulatory Visit: Payer: Self-pay

## 2015-03-27 NOTE — Progress Notes (Signed)
Final EKG in EPIC done 03/24/15.

## 2015-03-28 ENCOUNTER — Encounter (HOSPITAL_COMMUNITY): Payer: Self-pay

## 2015-03-28 DIAGNOSIS — S83249A Other tear of medial meniscus, current injury, unspecified knee, initial encounter: Secondary | ICD-10-CM | POA: Diagnosis present

## 2015-03-28 HISTORY — DX: Other tear of medial meniscus, current injury, unspecified knee, initial encounter: S83.249A

## 2015-03-28 NOTE — H&P (Signed)
CC- Jessica [name] is a 64 y.o. female who presents with left knee pain.  HPI- . Knee Pain: Patient presents with knee pain involving the  left knee. Onset of the symptoms was several months ago. Inciting event: none known. Current symptoms include giving out, locking, pain located medially, stiffness and swelling. Pain is aggravated by lateral movements, rising after sitting, squatting and standing.  Patient has had no prior knee problems. Evaluation to date: MRI: abnormal medial meniscal tear. Treatment to date: corticosteroid injection which was not very effective.  Past Medical History  Diagnosis Date  . Crohn's colitis   . Neuropathy   . Hypothyroid   . Hypertension   . Back pain, chronic   . Depression (emotion)   . Anxiety attack   . Heart murmur     hx of years ago   . GERD (gastroesophageal reflux disease)   . Arthritis   . Sleep apnea     cpap is not working per patient - needs to find a new doctor - not used since 7/16 per pat     Past Surgical History  Procedure Laterality Date  . Colon surgery      for Crohn's in the 32s in [place]  . Hip replacement       Rt hip in 2010 in [place]  . Cholecystectomy    . Abdominal hysterectomy    . Colonoscopy N/A 03/11/2013    Procedure: COLONOSCOPY;  Surgeon: Rogene Houston, MD;  Location: [facility] END[facility] SUITE;  Service: Endoscopy;  Laterality: N/A;  1200  . Fatty tumor removed      Prior to Admission medications   Medication Sig Start Date End Date Taking? Authorizing Provider  aspirin EC 81 MG tablet Take 81 mg by mouth daily.   Yes Historical Provider, MD  cetirizine (ZYRTEC) 10 MG tablet Take 10 mg by mouth daily.   Yes Historical Provider, MD  diphenoxylate-atropine (LOMOTIL) 2.5-0.025 MG per tablet Take 1 tablet by mouth 3 (three) times daily as needed for diarrhea or loose stools. 09/13/14  Yes Rogene Houston, MD  fluticasone (FLONASE) 50 MCG/ACT nasal spray Place 2 sprays into the nose daily.   Yes Historical Provider, MD   gabapentin (NEURONTIN) 300 MG capsule Take 1 capsule (300 mg total) by mouth 3 (three) times daily. 03/21/15  Yes Cloria Spring, MD  HYDROcodone-acetaminophen [facility]) 10-325 MG per tablet Take 1 tablet by mouth every 4 (four) hours as needed for moderate pain.    Yes Historical Provider, MD  levothyroxine (SYNTHROID) 112 MCG tablet Take 112 mcg by mouth daily before breakfast.    Yes Historical Provider, MD  lisinopril (PRINIVIL,ZESTRIL) 20 MG tablet Take 20 mg by mouth daily.   Yes Historical Provider, MD  Multiple Vitamin (MULTIVITAMIN WITH MINERALS) TABS tablet Take 1 tablet by mouth daily.   Yes Historical Provider, MD  omeprazole (PRILOSEC) 20 MG capsule Take 20 mg by mouth 2 (two) times daily.    Yes Historical Provider, MD  sertraline (ZOLOFT) 100 MG tablet Take 1 tablet (100 mg total) by mouth at bedtime. 03/21/15  Yes Cloria Spring, MD  sulfamethoxazole-trimethoprim (BACTRIM DS,SEPTRA DS) 800-160 MG per tablet Take 1 tablet by mouth 2 (two) times daily.   Yes Historical Provider, MD  tiZANidine (ZANAFLEX) 4 MG tablet Take 4 mg by mouth at bedtime.   Yes Historical Provider, MD  dicyclomine (BENTYL) 10 MG capsule 1 capsule before each meal as needed Patient not taking: Reported on 03/22/2015 09/13/14  Rogene Houston, MD   KNEE EXAM antalgic gait, soft tissue tenderness over medial joint line, effusion, negative drawer sign, collateral ligaments intact  Physical Examination: General appearance - alert, well appearing, and in no distress Mental status - alert, oriented to person, place, and time Chest - clear to auscultation, no wheezes, rales or rhonchi, symmetric air entry Heart - normal rate, regular rhythm, normal S1, S2, no murmurs, rubs, clicks or gallops Abdomen - soft, nontender, nondistended, no masses or organomegaly Neurological - alert, oriented, normal speech, no focal findings or movement disorder noted   Asessment/Plan--- Left knee medial meniscal tear- - Plan left knee  arthroscopy with meniscal debridement. Procedure risks and potential comps discussed with patient who elects to proceed. Goals are decreased pain and increased function with a high likelihood of achieving both

## 2015-03-28 NOTE — Progress Notes (Signed)
Patient to bring CPAP mask and tubing on day of surgery.

## 2015-03-29 ENCOUNTER — Ambulatory Visit (HOSPITAL_COMMUNITY)
Admission: RE | Admit: 2015-03-29 | Discharge: 2015-03-29 | Disposition: A | Discharge status: Home / Self Care | Payer: 59 | Source: Ambulatory Visit | Attending: Orthopedic Surgery | Admitting: Orthopedic Surgery

## 2015-03-29 ENCOUNTER — Encounter (HOSPITAL_COMMUNITY)
Admission: RE | Disposition: A | Discharge status: Home / Self Care | Payer: Self-pay | Source: Ambulatory Visit | Attending: Orthopedic Surgery

## 2015-03-29 ENCOUNTER — Ambulatory Visit (HOSPITAL_COMMUNITY): Payer: 59 | Admitting: Anesthesiology

## 2015-03-29 ENCOUNTER — Encounter (HOSPITAL_COMMUNITY): Payer: Self-pay | Admitting: *Deleted

## 2015-03-29 DIAGNOSIS — Z7982 Long term (current) use of aspirin: Secondary | ICD-10-CM | POA: Insufficient documentation

## 2015-03-29 DIAGNOSIS — Z7951 Long term (current) use of inhaled steroids: Secondary | ICD-10-CM | POA: Insufficient documentation

## 2015-03-29 DIAGNOSIS — G4730 Sleep apnea, unspecified: Secondary | ICD-10-CM | POA: Diagnosis not present

## 2015-03-29 DIAGNOSIS — G8929 Other chronic pain: Secondary | ICD-10-CM | POA: Insufficient documentation

## 2015-03-29 DIAGNOSIS — Z79891 Long term (current) use of opiate analgesic: Secondary | ICD-10-CM | POA: Diagnosis not present

## 2015-03-29 DIAGNOSIS — M1990 Unspecified osteoarthritis, unspecified site: Secondary | ICD-10-CM | POA: Insufficient documentation

## 2015-03-29 DIAGNOSIS — I10 Essential (primary) hypertension: Secondary | ICD-10-CM | POA: Diagnosis not present

## 2015-03-29 DIAGNOSIS — S83242A Other tear of medial meniscus, current injury, left knee, initial encounter: Secondary | ICD-10-CM | POA: Diagnosis not present

## 2015-03-29 DIAGNOSIS — K219 Gastro-esophageal reflux disease without esophagitis: Secondary | ICD-10-CM | POA: Diagnosis not present

## 2015-03-29 DIAGNOSIS — Z96641 Presence of right artificial hip joint: Secondary | ICD-10-CM | POA: Diagnosis not present

## 2015-03-29 DIAGNOSIS — M2242 Chondromalacia patellae, left knee: Secondary | ICD-10-CM | POA: Insufficient documentation

## 2015-03-29 DIAGNOSIS — F419 Anxiety disorder, unspecified: Secondary | ICD-10-CM | POA: Insufficient documentation

## 2015-03-29 DIAGNOSIS — E039 Hypothyroidism, unspecified: Secondary | ICD-10-CM | POA: Diagnosis not present

## 2015-03-29 DIAGNOSIS — F329 Major depressive disorder, single episode, unspecified: Secondary | ICD-10-CM | POA: Diagnosis not present

## 2015-03-29 DIAGNOSIS — M549 Dorsalgia, unspecified: Secondary | ICD-10-CM | POA: Insufficient documentation

## 2015-03-29 DIAGNOSIS — Z79899 Other long term (current) drug therapy: Secondary | ICD-10-CM | POA: Insufficient documentation

## 2015-03-29 DIAGNOSIS — X58XXXA Exposure to other specified factors, initial encounter: Secondary | ICD-10-CM | POA: Insufficient documentation

## 2015-03-29 DIAGNOSIS — S83249A Other tear of medial meniscus, current injury, unspecified knee, initial encounter: Secondary | ICD-10-CM | POA: Diagnosis present

## 2015-03-29 DIAGNOSIS — M25562 Pain in left knee: Secondary | ICD-10-CM | POA: Diagnosis present

## 2015-03-29 HISTORY — PX: KNEE ARTHROSCOPY: SHX127

## 2015-03-29 SURGERY — ARTHROSCOPY, KNEE
Anesthesia: General | Site: Knee | Laterality: Left

## 2015-03-29 MED ORDER — METHOCARBAMOL 500 MG PO TABS
ORAL_TABLET | ORAL | Status: AC
  Filled 2015-03-29: qty 1

## 2015-03-29 MED ORDER — LACTATED RINGERS IR SOLN
Status: DC | PRN
  Administered 2015-03-29: 3000 mL

## 2015-03-29 MED ORDER — CHLORHEXIDINE GLUCONATE 4 % EX LIQD: 60.0000 mL | Freq: Once | CUTANEOUS | Status: DC

## 2015-03-29 MED ORDER — LACTATED RINGERS IV SOLN: INTRAVENOUS | Status: DC

## 2015-03-29 MED ORDER — PROMETHAZINE HCL 25 MG/ML IJ SOLN: 6.2500 mg | INTRAMUSCULAR | Status: DC | PRN

## 2015-03-29 MED ORDER — PROPOFOL 10 MG/ML IV BOLUS
INTRAVENOUS | Status: AC
  Filled 2015-03-29: qty 20

## 2015-03-29 MED ORDER — BUPIVACAINE-EPINEPHRINE (PF) 0.25% -1:200000 IJ SOLN
INTRAMUSCULAR | Status: AC
Start: 2015-03-29 — End: 2015-03-29
  Filled 2015-03-29: qty 30

## 2015-03-29 MED ORDER — METHOCARBAMOL 500 MG PO TABS
500.0000 mg | ORAL_TABLET | Freq: Once | ORAL | Status: AC
  Administered 2015-03-29: 500 mg via ORAL

## 2015-03-29 MED ORDER — CEFAZOLIN SODIUM-DEXTROSE 2-3 GM-% IV SOLR
INTRAVENOUS | Status: AC
Start: 2015-03-29 — End: 2015-03-29
  Filled 2015-03-29: qty 50

## 2015-03-29 MED ORDER — LACTATED RINGERS IV SOLN
INTRAVENOUS | Status: DC | PRN
  Administered 2015-03-29: 15:00:00 via INTRAVENOUS

## 2015-03-29 MED ORDER — MIDAZOLAM HCL 2 MG/2ML IJ SOLN
INTRAMUSCULAR | Status: AC
  Filled 2015-03-29: qty 4

## 2015-03-29 MED ORDER — METHOCARBAMOL 500 MG PO TABS: 500.0000 mg | ORAL_TABLET | Freq: Four times a day (QID) | ORAL | Status: DC

## 2015-03-29 MED ORDER — HYDROCODONE-ACETAMINOPHEN 10-325 MG PO TABS: 1.0000 | ORAL_TABLET | ORAL | Status: DC | PRN

## 2015-03-29 MED ORDER — MEPERIDINE HCL 50 MG/ML IJ SOLN: 6.2500 mg | INTRAMUSCULAR | Status: DC | PRN

## 2015-03-29 MED ORDER — FENTANYL CITRATE (PF) 100 MCG/2ML IJ SOLN
INTRAMUSCULAR | Status: AC
  Filled 2015-03-29: qty 2

## 2015-03-29 MED ORDER — ACETAMINOPHEN 10 MG/ML IV SOLN
INTRAVENOUS | Status: AC
  Filled 2015-03-29: qty 100

## 2015-03-29 MED ORDER — CEFAZOLIN SODIUM-DEXTROSE 2-3 GM-% IV SOLR
2.0000 g | INTRAVENOUS | Status: AC
  Administered 2015-03-29: 2 g via INTRAVENOUS

## 2015-03-29 MED ORDER — ONDANSETRON HCL 4 MG/2ML IJ SOLN
INTRAMUSCULAR | Status: DC | PRN
  Administered 2015-03-29: 4 mg via INTRAVENOUS

## 2015-03-29 MED ORDER — FENTANYL CITRATE (PF) 100 MCG/2ML IJ SOLN
INTRAMUSCULAR | Status: AC
  Filled 2015-03-29: qty 4

## 2015-03-29 MED ORDER — SODIUM CHLORIDE 0.9 % IV SOLN: INTRAVENOUS | Status: DC

## 2015-03-29 MED ORDER — MIDAZOLAM HCL 5 MG/5ML IJ SOLN
INTRAMUSCULAR | Status: DC | PRN
  Administered 2015-03-29: 2 mg via INTRAVENOUS

## 2015-03-29 MED ORDER — DEXAMETHASONE SODIUM PHOSPHATE 10 MG/ML IJ SOLN
INTRAMUSCULAR | Status: AC
Start: 2015-03-29 — End: 2015-03-29
  Filled 2015-03-29: qty 1

## 2015-03-29 MED ORDER — FENTANYL CITRATE (PF) 100 MCG/2ML IJ SOLN
25.0000 ug | INTRAMUSCULAR | Status: DC | PRN
  Administered 2015-03-29 (×3): 50 ug via INTRAVENOUS

## 2015-03-29 MED ORDER — ONDANSETRON HCL 4 MG/2ML IJ SOLN
INTRAMUSCULAR | Status: AC
  Filled 2015-03-29: qty 2

## 2015-03-29 MED ORDER — FENTANYL CITRATE (PF) 100 MCG/2ML IJ SOLN
INTRAMUSCULAR | Status: DC | PRN
  Administered 2015-03-29 (×2): 25 ug via INTRAVENOUS
  Administered 2015-03-29: 50 ug via INTRAVENOUS

## 2015-03-29 MED ORDER — ACETAMINOPHEN 10 MG/ML IV SOLN
1000.0000 mg | Freq: Once | INTRAVENOUS | Status: AC
  Administered 2015-03-29: 1000 mg via INTRAVENOUS

## 2015-03-29 MED ORDER — DEXAMETHASONE SODIUM PHOSPHATE 10 MG/ML IJ SOLN
10.0000 mg | Freq: Once | INTRAMUSCULAR | Status: AC
  Administered 2015-03-29: 10 mg via INTRAVENOUS

## 2015-03-29 MED ORDER — PROPOFOL 10 MG/ML IV BOLUS
INTRAVENOUS | Status: DC | PRN
  Administered 2015-03-29: 150 mg via INTRAVENOUS

## 2015-03-29 MED ORDER — BUPIVACAINE-EPINEPHRINE 0.25% -1:200000 IJ SOLN
INTRAMUSCULAR | Status: DC | PRN
  Administered 2015-03-29: 30 mL

## 2015-03-29 MED ORDER — SUCCINYLCHOLINE CHLORIDE 20 MG/ML IJ SOLN
INTRAMUSCULAR | Status: DC | PRN
  Administered 2015-03-29: 100 mg via INTRAVENOUS

## 2015-03-29 SURGICAL SUPPLY — 28 items
BANDAGE ELASTIC 6 VELCRO ST LF (GAUZE/BANDAGES/DRESSINGS) ×2 IMPLANT
BLADE 4.2CUDA (BLADE) ×2 IMPLANT
COVER SURGICAL LIGHT HANDLE (MISCELLANEOUS) ×2 IMPLANT
CUFF TOURN SGL QUICK 34 (TOURNIQUET CUFF) ×2
CUFF TRNQT CYL 34X4X40X1 (TOURNIQUET CUFF) ×1 IMPLANT
DRAPE U-SHAPE 47X51 STRL (DRAPES) ×2 IMPLANT
DRSG EMULSION OIL 3X3 NADH (GAUZE/BANDAGES/DRESSINGS) ×2 IMPLANT
DRSG PAD ABDOMINAL 8X10 ST (GAUZE/BANDAGES/DRESSINGS) ×2 IMPLANT
DURAPREP 26ML APPLICATOR (WOUND CARE) ×2 IMPLANT
GAUZE SPONGE 4X4 12PLY STRL (GAUZE/BANDAGES/DRESSINGS) ×2 IMPLANT
GLOVE BIO SURGEON STRL SZ8 (GLOVE) ×2 IMPLANT
GLOVE BIOGEL PI IND STRL 8 (GLOVE) ×1 IMPLANT
GLOVE BIOGEL PI INDICATOR 8 (GLOVE) ×1
GOWN STRL REUS W/TWL LRG LVL3 (GOWN DISPOSABLE) ×2 IMPLANT
KIT BASIN OR (CUSTOM PROCEDURE TRAY) ×2 IMPLANT
MANIFOLD NEPTUNE II (INSTRUMENTS) ×2 IMPLANT
PACK ARTHROSCOPY WL (CUSTOM PROCEDURE TRAY) ×2 IMPLANT
PACK ICE MAXI GEL EZY WRAP (MISCELLANEOUS) ×6 IMPLANT
PAD ABD 8X10 STRL (GAUZE/BANDAGES/DRESSINGS) ×1 IMPLANT
PADDING CAST COTTON 6X4 STRL (CAST SUPPLIES) ×3 IMPLANT
PEN SKIN MARKING BROAD (MISCELLANEOUS) ×2 IMPLANT
POSITIONER SURGICAL ARM (MISCELLANEOUS) ×2 IMPLANT
SET ARTHROSCOPY TUBING (MISCELLANEOUS) ×2
SET ARTHROSCOPY TUBING LN (MISCELLANEOUS) ×1 IMPLANT
SUT ETHILON 4 0 PS 2 18 (SUTURE) ×2 IMPLANT
TOWEL OR 17X26 10 PK STRL BLUE (TOWEL DISPOSABLE) ×2 IMPLANT
WAND 90 DEG TURBOVAC W/CORD (SURGICAL WAND) IMPLANT
WRAP KNEE MAXI GEL POST OP (GAUZE/BANDAGES/DRESSINGS) ×2 IMPLANT

## 2015-03-29 NOTE — Transfer of Care (Signed)
Immediate Anesthesia Transfer of Care Note  Patient: Jessica [name]  Procedure(s) Performed: Procedure(s): ARTHROSCOPY LEFT KNEE WITH MENSICAL DEBRIDEMENT, chondroplasty (Left)  Patient Location: PACU  Anesthesia Type:General  Level of Consciousness:  sedated, patient cooperative and responds to stimulation  Airway & Oxygen Therapy:Patient Spontanous Breathing and Patient connected to face mask oxgen  Post-op Assessment:  Report given to PACU RN and Post -op Vital signs reviewed and stable  Post vital signs:  Reviewed and stable  Last Vitals:  Filed Vitals:   03/29/15 1246  BP: 138/83  Pulse: 84  Temp: 36.6 C  Resp: 16    Complications: No apparent anesthesia complications

## 2015-03-29 NOTE — Discharge Instructions (Signed)
Dr. Gaynelle Arabian Total Joint Specialist [facility] [address] [place], [place] [address] [phone]   Arthroscopic Procedure, Knee An arthroscopic procedure can find what is wrong with your knee. PROCEDURE Arthroscopy is a surgical technique that allows your orthopedic surgeon to diagnose and treat your knee injury with accuracy. They will look into your knee through a small instrument. This is almost like a small (pencil sized) telescope. Because arthroscopy affects your knee less than open knee surgery, you can anticipate a more rapid recovery. Taking an active role by following your caregiver's instructions will help with rapid and complete recovery. Use crutches, rest, elevation, ice, and knee exercises as instructed. The length of recovery depends on various factors including type of injury, age, physical condition, medical conditions, and your rehabilitation. Your knee is the joint between the large bones (femur and tibia) in your leg. Cartilage covers these bone ends which are smooth and slippery and allow your knee to bend and move smoothly. Two menisci, thick, semi-lunar shaped pads of cartilage which form a rim inside the joint, help absorb shock and stabilize your knee. Ligaments bind the bones together and support your knee joint. Muscles move the joint, help support your knee, and take stress off the joint itself. Because of this all programs and physical therapy to rehabilitate an injured or repaired knee require rebuilding and strengthening your muscles. AFTER THE PROCEDURE  After the procedure, you will be moved to a recovery area until most of the effects of the medication have worn off. Your caregiver will discuss the test results with you.   Only take over-the-counter or prescription medicines for pain, discomfort, or fever as directed by your caregiver.  SEEK MEDICAL CARE IF:   You have increased bleeding from your wounds.   You see  redness, swelling, or have increasing pain in your wounds.   You have pus coming from your wound.   You have an oral temperature above 102 F (38.9 C).   You notice a bad smell coming from the wound or dressing.   You have severe pain with any motion of your knee.  SEEK IMMEDIATE MEDICAL CARE IF:   You develop a rash.   You have difficulty breathing.   You have any allergic problems.  FURTHER INSTRUCTIONS:   ICE to the affected knee every three hours for 30 minutes at a time and then as needed for pain and swelling.  Continue to use ice on the knee for pain and swelling from surgery. You may notice swelling that will progress down to the foot and ankle.  This is normal after surgery.  Elevate the leg when you are not up walking on it.    DIET You may resume your previous home diet once your are discharged from the hospital.  DRESSING / WOUND CARE / SHOWERING You may start showering two days after being discharged home but do not submerge the incisions under water.  Change dressing 48 hours after the procedure and then cover the small incisions with band aids until your follow up visit. Change the surgical dressings daily and reapply a dry dressing each time.   ACTIVITY Walk with your walker as instructed. Use walker as long as suggested by your caregivers. Avoid periods of inactivity such as sitting longer than an hour when not asleep. This helps prevent blood clots.  You may resume a sexual relationship in one month or when given the OK by your doctor.  You may return to  work once you are cleared by your doctor.  Do not drive a car for 6 weeks or until released by you surgeon.  Do not drive while taking narcotics.  WEIGHT BEARING AS TOLERATED  POSTOPERATIVE CONSTIPATION PROTOCOL Constipation - defined medically as fewer than three stools per week and severe constipation as less than one stool per week.  One of the most common issues patients have following surgery is  constipation.  Even if you have a regular bowel pattern at home, your normal regimen is likely to be disrupted due to multiple reasons following surgery.  Combination of anesthesia, postoperative narcotics, change in appetite and fluid intake all can affect your bowels.  In order to avoid complications following surgery, here are some recommendations in order to help you during your recovery period.  Colace (docusate) - Pick up an over-the-counter form of Colace or another stool softener and take twice a day as long as you are requiring postoperative pain medications.  Take with a full glass of water daily.  If you experience loose stools or diarrhea, hold the colace until you stool forms back up.  If your symptoms do not get better within 1 week or if they get worse, check with your doctor.  Dulcolax (bisacodyl) - Pick up over-the-counter and take as directed by the product packaging as needed to assist with the movement of your bowels.  Take with a full glass of water.  Use this product as needed if not relieved by Colace only.   MiraLax (polyethylene glycol) - Pick up over-the-counter to have on hand.  MiraLax is a solution that will increase the amount of water in your bowels to assist with bowel movements.  Take as directed and can mix with a glass of water, juice, soda, coffee, or tea.  Take if you go more than two days without a movement. Do not use MiraLax more than once per day. Call your doctor if you are still constipated or irregular after using this medication for 7 days in a row.  If you continue to have problems with postoperative constipation, please contact the office for further assistance and recommendations.  If you experience "the worst abdominal pain ever" or develop nausea or vomiting, please contact the office immediatly for further recommendations for treatment.  ITCHING  If you experience itching with your medications, try taking only a single pain pill, or even half a pain pill  at a time.  You can also use Benadryl over the counter for itching or also to help with sleep.   TED HOSE STOCKINGS Wear the elastic stockings on both legs for three weeks following surgery during the day but you may remove then at night for sleeping.  MEDICATIONS See your medication summary on the After Visit Summary that the nursing staff will review with you prior to discharge.  You may have some home medications which will be placed on hold until you complete the course of blood thinner medication.  It is important for you to complete the blood thinner medication as prescribed by your surgeon.  Continue your approved medications as instructed at time of discharge. Do not drive while taking narcotics.   PRECAUTIONS If you experience chest pain or shortness of breath - call 911 immediately for transfer to the hospital emergency department.  If you develop a fever greater that 101 F, purulent drainage from wound, increased redness or drainage from wound, foul odor from the wound/dressing, or calf pain - CONTACT YOUR SURGEON.  FOLLOW-UP APPOINTMENTS Make sure you keep all of your appointments after your operation with your surgeon and caregivers. You should call the office at [phone]  and make an appointment for approximately one week after the date of your surgery or on the date instructed by your surgeon outlined in the "After Visit Summary".  RANGE OF MOTION AND STRENGTHENING EXERCISES  Rehabilitation of the knee is important following a knee injury or an operation. After just a few days of immobilization, the muscles of the thigh which control the knee become weakened and shrink (atrophy). Knee exercises are designed to build up the tone and strength of the thigh muscles and to improve knee motion. Often times heat used for twenty to thirty minutes before working out will loosen up your tissues and help with improving the range of motion  but do not use heat for the first two weeks following surgery. These exercises can be done on a training (exercise) mat, on the floor, on a table or on a bed. Use what ever works the best and is most comfortable for you Knee exercises include:  QUAD STRENGTHENING EXERCISES Strengthening Quadriceps Sets  Tighten muscles on top of thigh by pushing knees down into floor or table. Hold for 20 seconds. Repeat 10 times. Do 2 sessions per day.     Strengthening Terminal Knee Extension  With knee bent over bolster, straighten knee by tightening muscle on top of thigh. Be sure to keep bottom of knee on bolster. Hold for 20 seconds. Repeat 10 times. Do 2 sessions per day.   Straight Leg with Bent Knee  Lie on back with opposite leg bent. Keep involved knee slightly bent at knee and raise leg 4-6". Hold for 10 seconds. Repeat 20 times per set. Do 2 sets per session. Do 2 sessions per day.      General Anesthesia, Care After Refer to this sheet in the next few weeks. These instructions provide you with information on caring for yourself after your procedure. Your health care provider may also give you more specific instructions. Your treatment has been planned according to current medical practices, but problems sometimes occur. Call your health care provider if you have any problems or questions after your procedure. WHAT TO EXPECT AFTER THE PROCEDURE After the procedure, it is typical to experience:  Sleepiness.  Nausea and vomiting. HOME CARE INSTRUCTIONS  For the first 24 hours after general anesthesia:  Have a responsible person with you.  Do not drive a car. If you are alone, do not take public transportation.  Do not drink alcohol.  Do not take medicine that has not been prescribed by your health care provider.  Do not sign important papers or make important decisions.  You may resume a normal diet and activities as directed by your health care provider.  Change  bandages (dressings) as directed.  If you have questions or problems that seem related to general anesthesia, call the hospital and ask for the anesthetist or anesthesiologist on call. SEEK MEDICAL CARE IF:  You have nausea and vomiting that continue the day after anesthesia.  You develop a rash. SEEK IMMEDIATE MEDICAL CARE IF:   You have difficulty breathing.  You have chest pain.  You have any allergic problems. Document Released: 09/30/2000 Document Revised: 06/29/2013 Document Reviewed: 01/07/2013 [facility] Patient Information 2015 [place], [place]. This information is not intended to replace advice given to you by your health care provider. Make sure you discuss any questions you have with your  health care provider.

## 2015-03-29 NOTE — Anesthesia Procedure Notes (Signed)
Procedure Name: Intubation Date/Time: 03/29/2015 3:23 PM Performed by: Anne Fu Pre-anesthesia Checklist: Patient identified, Emergency Drugs available, Suction available, Patient being monitored and Timeout performed Patient Re-evaluated:Patient Re-evaluated prior to inductionOxygen Delivery Method: Circle system utilized Preoxygenation: Pre-oxygenation with 100% oxygen Intubation Type: IV induction, Rapid sequence and Cricoid Pressure applied Ventilation: Mask ventilation without difficulty Laryngoscope Size: Miller and 2 Grade View: Grade I Tube type: Oral Tube size: 7.5 mm Number of attempts: 1 Airway Equipment and Method: Stylet Placement Confirmation: ETT inserted through vocal cords under direct vision,  positive ETCO2,  CO2 detector and breath sounds checked- equal and bilateral Secured at: 19 cm Tube secured with: Tape Dental Injury: Teeth and Oropharynx as per pre-operative assessment  Comments: First attempt with MAC 4 Noted Grade III view; converted to [employer] 2 MD Carignan X 1 with Grade I view.  BBS confirmed post intubation.  Pt loose cap not in places as per-pre-op evaluation.

## 2015-03-29 NOTE — Op Note (Signed)
Preoperative diagnosis-  Left knee medial meniscal tear  Postoperative diagnosis Left- knee medial meniscal tear plus  Chondral defect  Procedure- Left knee arthroscopy with medial  meniscal debridement and chondroplasty   Surgeon- Dione Plover. Aluisio, MD  Anesthesia-General  EBL-  Minimal  Complications- None  Condition- PACU - hemodynamically stable.  Brief clinical note- -Jessica [name] is a 64 y.o.  female with a several month history of left knee pain and mechanical symptoms. Exam and history suggested medial meniscal tear confirmed by MRI. The patient presents now for arthroscopy and debridement  Procedure in detail -       After successful administration of General anesthetic, a tourmiquet is placed high on the Left  thigh and the Left lower extremity is prepped and draped in the usual sterile fashion. Time out is performed by the surgical team. Standard superomedial and inferolateral portal sites are marked and incisions made with an 11 blade. The inflow cannula is passed through the superomedial portal and camera through the inferolateral portal and inflow is initiated. Arthroscopic visualization proceeds.      The undersurface of the patella and trochlea are visualized and there is mild chondromalacia patella with no unstable defects  . The medial and lateral gutters are visualized and there are  no loose bodies. Flexion and valgus force is applied to the knee and the medial compartment is entered. A spinal needle is passed into the joint through the site marked for the inferomedial portal. A small incision is made and the dilator passed into the joint. 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 The tear is debrided to a stable base with baskets and a shaver and sealed off with the Arthrocare. The shaver is  used to debride the unstable cartilage to a stable cartilaginous base with stable edges. The exposed bone is abraded with the shaver. The meniscal remnant  is probed and found to be stable.    The intercondylar notch is visualized and the ACL appears normal. The lateral compartment is entered and the findings are normal .      The joint is again inspected and there are no other tears, defects or loose bodies identified. The arthroscopic equipment is then removed from the inferior portals which are closed with interrupted 4-0 nylon. 20 ml of .25% Marcaine with epinephrine are injected through the inflow cannula and the cannula is then removed and the portal closed with nylon. The incisions are cleaned and dried and a bulky sterile dressing is applied. The patient is then awakened and transported to recovery in stable condition.   03/29/2015, 4:03 PM

## 2015-03-29 NOTE — Anesthesia Postprocedure Evaluation (Signed)
  Anesthesia Post-op Note  Patient: Jessica [name]  Procedure(s) Performed: Procedure(s) (LRB): ARTHROSCOPY LEFT KNEE WITH MENSICAL DEBRIDEMENT, chondroplasty (Left)  Patient Location: PACU  Anesthesia Type: General  Level of Consciousness: awake and alert   Airway and Oxygen Therapy: Patient Spontanous Breathing  Post-op Pain: mild  Post-op Assessment: Post-op Vital signs reviewed, Patient's Cardiovascular Status Stable, Respiratory Function Stable, Patent Airway and No signs of Nausea or vomiting  Last Vitals:  Filed Vitals:   03/29/15 1839  BP: 159/82  Pulse: 87  Temp: 37.2 C  Resp: 16    Post-op Vital Signs: stable   Complications: No apparent anesthesia complications

## 2015-03-29 NOTE — Anesthesia Preprocedure Evaluation (Signed)
Anesthesia Evaluation  Patient identified by MRN, date of birth, ID band Patient awake    Reviewed: Allergy & Precautions, NPO status , Patient's Chart, lab work & pertinent test results  Airway Mallampati: II  TM Distance: >3 FB Neck ROM: Full    Dental no notable dental hx. (+) Missing, Loose, Caps,    Pulmonary sleep apnea ,    Pulmonary exam normal breath sounds clear to auscultation       Cardiovascular hypertension, Pt. on medications Normal cardiovascular exam Rhythm:Regular Rate:Normal     Neuro/Psych Anxiety Depression negative neurological ROS     GI/Hepatic Neg liver ROS, GERD  Medicated and Controlled,  Endo/Other  negative endocrine ROS  Renal/GU negative Renal ROS  negative genitourinary   Musculoskeletal negative musculoskeletal ROS (+)   Abdominal   Peds negative pediatric ROS (+)  Hematology negative hematology ROS (+)   Anesthesia Other Findings   Reproductive/Obstetrics negative OB ROS                             Anesthesia Physical Anesthesia Plan  ASA: II  Anesthesia Plan: General   Post-op Pain Management:    Induction: Intravenous  Airway Management Planned: Oral ETT  Additional Equipment:   Intra-op Plan:   Post-operative Plan: Extubation in OR  Informed Consent: I have reviewed the patients History and Physical, chart, labs and discussed the procedure including the risks, benefits and alternatives for the proposed anesthesia with the patient or authorized representative who has indicated his/her understanding and acceptance.   Dental advisory given  Plan Discussed with: CRNA  Anesthesia Plan Comments:         Anesthesia Quick Evaluation

## 2015-03-29 NOTE — Interval H&P Note (Signed)
History and Physical Interval Note:  03/29/2015 3:05 PM  Jessica [name]  has presented today for surgery, with the diagnosis of left medial mensical tear  The various methods of treatment have been discussed with the patient and family. After consideration of risks, benefits and other options for treatment, the patient has consented to  Procedure(s): ARTHROSCOPY LEFT KNEE WITH MENSICAL DEBRIDEMENT (Left) as a surgical intervention .  The patient's history has been reviewed, patient examined, no change in status, stable for surgery.  I have reviewed the patient's chart and labs.  Questions were answered to the patient's satisfaction.     Gearlean Alf

## 2015-03-29 NOTE — Progress Notes (Signed)
Patient states she drank a sprite on the way to the hospital just now. Dr. Marcell Barlow notified. He and Dr. Wynelle Link states her surgery will be done but she will be on a 2 hour delay. Patient was informed

## 2015-05-09 ENCOUNTER — Telehealth (INDEPENDENT_AMBULATORY_CARE_PROVIDER_SITE_OTHER): Payer: Self-pay | Admitting: *Deleted

## 2015-05-09 ENCOUNTER — Ambulatory Visit (INDEPENDENT_AMBULATORY_CARE_PROVIDER_SITE_OTHER): Payer: 59 | Admitting: Internal Medicine

## 2015-05-09 ENCOUNTER — Encounter (INDEPENDENT_AMBULATORY_CARE_PROVIDER_SITE_OTHER): Payer: Self-pay | Admitting: Internal Medicine

## 2015-05-09 VITALS — BP 112/70 | HR 80 | Temp 98.5°F | Ht 62.0 in | Wt 174.9 lb

## 2015-05-09 DIAGNOSIS — K5000 Crohn's disease of small intestine without complications: Secondary | ICD-10-CM

## 2015-05-09 LAB — CBC WITH DIFFERENTIAL/PLATELET
BASOS ABS: 0 10*3/uL (ref 0.0–0.1)
BASOS PCT: 0 % (ref 0–1)
EOS ABS: 0.2 10*3/uL (ref 0.0–0.7)
EOS PCT: 2 % (ref 0–5)
HCT: 37.9 % (ref 36.0–46.0)
Hemoglobin: 12.8 g/dL (ref 12.0–15.0)
LYMPHS ABS: 2.8 10*3/uL (ref 0.7–4.0)
Lymphocytes Relative: 31 % (ref 12–46)
MCH: 31 pg (ref 26.0–34.0)
MCHC: 33.8 g/dL (ref 30.0–36.0)
MCV: 91.8 fL (ref 78.0–100.0)
MPV: 11.1 fL (ref 8.6–12.4)
Monocytes Absolute: 0.7 10*3/uL (ref 0.1–1.0)
Monocytes Relative: 8 % (ref 3–12)
Neutro Abs: 5.3 10*3/uL (ref 1.7–7.7)
Neutrophils Relative %: 59 % (ref 43–77)
PLATELETS: 196 10*3/uL (ref 150–400)
RBC: 4.13 MIL/uL (ref 3.87–5.11)
RDW: 13.4 % (ref 11.5–15.5)
WBC: 9 10*3/uL (ref 4.0–10.5)

## 2015-05-09 LAB — C-REACTIVE PROTEIN: CRP: <0.5 mg/dL (ref <0.60)

## 2015-05-09 MED ORDER — DICYCLOMINE HCL 10 MG PO CAPS: 10.0000 mg | ORAL_CAPSULE | Freq: Two times a day (BID) | ORAL | Status: DC

## 2015-05-09 MED ORDER — MESALAMINE ER 500 MG PO CPCR: 1000.0000 mg | ORAL_CAPSULE | Freq: Two times a day (BID) | ORAL | Status: DC

## 2015-05-09 NOTE — Patient Instructions (Signed)
CBC, crp. OV in 3 month

## 2015-05-09 NOTE — Telephone Encounter (Signed)
Cyrstal said her insurance will not cover the Pentasa. Insurance will cover Sulfazine 500 mg, Balsalazide 750 mg and Sulfasalazome 500 mg.  Please remind Terri, she can not take anything with sulfa in it. Her return phone number is [phone] or [phone].

## 2015-05-09 NOTE — Telephone Encounter (Signed)
Message left at home

## 2015-05-09 NOTE — Progress Notes (Signed)
Subjective:    Patient ID: Jessica [name], female    DOB: Sep 03, 1950, 64 y.o.   MRN: [id number]  HPI  Here today for f/u of her Crohn's disease.  She tells me she was admitted to [facility] 1 week ago for Crohn's flare. She was having abdominal pain and nausea and vomiting. She had abdominal cramps the day of admission. She was admitted x 2. She was given a Prednisone dose pack and Cipro on discharge. She is almost finished with the Cipro.  She was treated with Cipro x 7 days. She tells me she still has some cramping in her upper abdomen. She says the cramping is not severe. She says she is afraid to eat a large amt because she does not want the Crohn's to flare back up. She underwent a CT scan and I will get that report.  She is having about 5-6 stools a day. Her stools are loose. No melena or BRRB. No mucous.  She does not want to take Pentasa because she felt it was too many pills to take.  She has lost 20 pounds since her last visit in March.  She was last seen by Dr. Laural Golden in March of this year. Plans were for MR enterography but she could not afford. CT scan 05/01/2015 Mild wall thickening along the mid to distal ileum, with associated soft tissue inflammation and trace free fluid tracking along the mesentery. Raises concern for acute infectious or inflammatory ileitis.    04/30/2015 WBC 12.5, H and H 15.4 and 3.6,.    03/11/2013   Colonoscopy  Indications: Patient is 64 year-old Caucasian female with history of Crohn disease who presents with diarrhea. Her last colonoscopy was in August 2000 it. She is undergoing colonoscopy for diagnostic and surveillance purposes. She presently is on no therapy for IBD. 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 Biopsy taken. No evidence of colonic polyps or colitis. Biopsy:  Biopsy results reviewed with patient. She has active small bowel Crohn's. She should continue Pentasa 1 g by mouth 4 times a  day.    Review of Systems Past Medical History  Diagnosis Date  . Crohn's colitis ([place])   . Neuropathy ([place])   . Hypothyroid   . Hypertension   . Back pain, chronic   . Depression (emotion)   . Anxiety attack   . Heart murmur     hx of years ago   . GERD (gastroesophageal reflux disease)   . Arthritis   . Sleep apnea     cpap is not working per patient - needs to find a new doctor - not used since 7/16 per pat     Past Surgical History  Procedure Laterality Date  . Colon surgery      for Crohn's in the 70s in [place]  . Hip replacement       Rt hip in 2010 in [place]  . Cholecystectomy    . Abdominal hysterectomy    . Colonoscopy N/A 03/11/2013    Procedure: COLONOSCOPY;  Surgeon: Rogene Houston, MD;  Location: [facility] ENDO SUITE;  Service: Endoscopy;  Laterality: N/A;  1200  . Fatty tumor removed    . Knee arthroscopy Left 03/29/2015    Procedure: ARTHROSCOPY LEFT KNEE WITH MENSICAL DEBRIDEMENT, chondroplasty;  Surgeon: Gaynelle Arabian, MD;  Location: [facility] ORS;  Service: Orthopedics;  Laterality: Left;    Allergies  Allergen Reactions  . Morphine And Related Itching    Current  Outpatient Prescriptions on File Prior to Visit  Medication Sig Dispense Refill  . aspirin EC 81 MG tablet Take 81 mg by mouth daily.    . cetirizine (ZYRTEC) 10 MG tablet Take 10 mg by mouth daily.    . diphenoxylate-atropine (LOMOTIL) 2.5-0.025 MG per tablet Take 1 tablet by mouth 3 (three) times daily as needed for diarrhea or loose stools. 60 tablet 1  . fluticasone (FLONASE) 50 MCG/ACT nasal spray Place 2 sprays into the nose daily.    Marland Kitchen gabapentin (NEURONTIN) 300 MG capsule Take 1 capsule (300 mg total) by mouth 3 (three) times daily. 270 capsule 2  . HYDROcodone-acetaminophen (NORCO) 10-325 MG per tablet Take 1-2 tablets by mouth every 4 (four) hours as needed for moderate pain. 40 tablet 0  . levothyroxine (SYNTHROID) 112 MCG tablet Take 112 mcg by mouth daily before breakfast.     .  lisinopril (PRINIVIL,ZESTRIL) 20 MG tablet Take 20 mg by mouth daily.    . Multiple Vitamin (MULTIVITAMIN WITH MINERALS) TABS tablet Take 1 tablet by mouth daily.    Marland Kitchen omeprazole (PRILOSEC) 20 MG capsule Take 20 mg by mouth 2 (two) times daily.     . sertraline (ZOLOFT) 100 MG tablet Take 1 tablet (100 mg total) by mouth at bedtime. 90 tablet 2   No current facility-administered medications on file prior to visit.        Objective:   Physical Exam Blood pressure 112/70, pulse 80, temperature 98.5 F (36.9 C), height 5' 2"  (1.575 m), weight 174 lb 14.4 oz (79.334 kg). Alert and oriented. Skin warm and dry. Oral mucosa is moist.   . Sclera anicteric, conjunctivae is pink. Thyroid not enlarged. No cervical lymphadenopathy. Lungs clear. Heart regular rate and rhythm.  Abdomen is soft. Bowel sounds are positive. No hepatomegaly. No abdominal masses felt. Tenderness over lower abdomen.  No edema to lower extremities.          Assessment & Plan:  Crohn's flare. Am going to get a CBC and CRP. Am going to start her low on Pentasa 547m, two tabs twice a day. Dicyclomine 127mBID. CBC and CRP today.  OV in 3 months.

## 2015-05-10 NOTE — Telephone Encounter (Signed)
I have spoken with patient. She says drug store has started a PA

## 2015-05-16 ENCOUNTER — Encounter (INDEPENDENT_AMBULATORY_CARE_PROVIDER_SITE_OTHER): Payer: Self-pay

## 2015-05-18 ENCOUNTER — Telehealth (INDEPENDENT_AMBULATORY_CARE_PROVIDER_SITE_OTHER): Payer: Self-pay | Admitting: *Deleted

## 2015-05-18 NOTE — Telephone Encounter (Signed)
Pamala Hurry called and has provided 3 other medications that are on the patient's formulary.  Apriso , Lialda, Belsalazide. She ask that you call [phone] ext [phone].

## 2015-05-19 ENCOUNTER — Telehealth (INDEPENDENT_AMBULATORY_CARE_PROVIDER_SITE_OTHER): Payer: Self-pay | Admitting: Internal Medicine

## 2015-05-19 MED ORDER — MESALAMINE 1.2 G PO TBEC: 1.2000 g | DELAYED_RELEASE_TABLET | Freq: Every day | ORAL | Status: DC

## 2015-05-19 NOTE — Telephone Encounter (Signed)
Message left at home. Lialda called to her drug store.

## 2015-05-19 NOTE — Telephone Encounter (Signed)
I have sent an Rx for Lialda to her pharmacy.

## 2015-05-26 ENCOUNTER — Other Ambulatory Visit (INDEPENDENT_AMBULATORY_CARE_PROVIDER_SITE_OTHER): Payer: Self-pay | Admitting: Internal Medicine

## 2015-06-16 ENCOUNTER — Other Ambulatory Visit: Payer: Self-pay | Admitting: Orthopedic Surgery

## 2015-06-16 DIAGNOSIS — M1712 Unilateral primary osteoarthritis, left knee: Secondary | ICD-10-CM

## 2015-06-21 ENCOUNTER — Other Ambulatory Visit: Payer: Self-pay

## 2015-06-26 ENCOUNTER — Telehealth (HOSPITAL_COMMUNITY): Payer: Self-pay | Admitting: *Deleted

## 2015-06-26 NOTE — Telephone Encounter (Signed)
phone call from patient, she need refill of Sertraline HCL 100 mg, Lisinopril 20 mg, Gabapentin 300 mg.   She uses [facility].

## 2015-06-30 ENCOUNTER — Ambulatory Visit
Admission: RE | Admit: 2015-06-30 | Discharge: 2015-06-30 | Disposition: A | Discharge status: Home / Self Care | Payer: 59 | Source: Ambulatory Visit | Attending: Orthopedic Surgery | Admitting: Orthopedic Surgery

## 2015-06-30 DIAGNOSIS — M1712 Unilateral primary osteoarthritis, left knee: Secondary | ICD-10-CM

## 2015-07-04 ENCOUNTER — Telehealth (HOSPITAL_COMMUNITY): Payer: Self-pay | Admitting: *Deleted

## 2015-07-04 ENCOUNTER — Other Ambulatory Visit (HOSPITAL_COMMUNITY): Payer: Self-pay | Admitting: Psychiatry

## 2015-07-04 MED ORDER — SERTRALINE HCL 100 MG PO TABS
100.0000 mg | ORAL_TABLET | Freq: Every day | ORAL | Status: DC
Start: 2015-07-04 — End: 2015-08-07

## 2015-07-04 NOTE — Telephone Encounter (Signed)
Message sent provider

## 2015-07-04 NOTE — Telephone Encounter (Signed)
Pt called stating that she is out of her Zoloft. Pt medication was last filled on 03-21-15 with 2 refills. Pt f/u appt is sch for 07-21-15. Pt number is [phone].

## 2015-07-04 NOTE — Telephone Encounter (Signed)
Refill sent.

## 2015-07-04 NOTE — Telephone Encounter (Signed)
noted 

## 2015-07-04 NOTE — Telephone Encounter (Signed)
Per pt provider, refills was sent to pharmacy today

## 2015-07-04 NOTE — Telephone Encounter (Signed)
voice message from patient, stated her Sertraline did not come with the rest of her meds.   She only has two pills left.

## 2015-07-21 ENCOUNTER — Ambulatory Visit (HOSPITAL_COMMUNITY): Payer: Self-pay | Admitting: Psychiatry

## 2015-08-07 ENCOUNTER — Encounter (HOSPITAL_COMMUNITY): Payer: Self-pay | Admitting: Psychiatry

## 2015-08-07 ENCOUNTER — Ambulatory Visit (INDEPENDENT_AMBULATORY_CARE_PROVIDER_SITE_OTHER): Payer: 59 | Admitting: Psychiatry

## 2015-08-07 VITALS — BP 127/79 | HR 76 | Ht 62.0 in | Wt 170.0 lb

## 2015-08-07 DIAGNOSIS — F411 Generalized anxiety disorder: Secondary | ICD-10-CM | POA: Diagnosis not present

## 2015-08-07 DIAGNOSIS — F331 Major depressive disorder, recurrent, moderate: Secondary | ICD-10-CM

## 2015-08-07 MED ORDER — SERTRALINE HCL 100 MG PO TABS: 100.0000 mg | ORAL_TABLET | Freq: Every day | ORAL | Status: DC
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
## 2015-08-09 ENCOUNTER — Ambulatory Visit (INDEPENDENT_AMBULATORY_CARE_PROVIDER_SITE_OTHER): Payer: Self-pay | Admitting: Internal Medicine

## 2015-08-10 ENCOUNTER — Encounter (INDEPENDENT_AMBULATORY_CARE_PROVIDER_SITE_OTHER): Payer: Self-pay | Admitting: Internal Medicine

## 2015-10-24 ENCOUNTER — Ambulatory Visit (INDEPENDENT_AMBULATORY_CARE_PROVIDER_SITE_OTHER): Payer: Self-pay | Admitting: Internal Medicine

## 2015-11-06 ENCOUNTER — Ambulatory Visit (INDEPENDENT_AMBULATORY_CARE_PROVIDER_SITE_OTHER): Payer: Self-pay | Admitting: Internal Medicine

## 2015-12-05 ENCOUNTER — Encounter (HOSPITAL_COMMUNITY): Payer: Self-pay | Admitting: Psychiatry

## 2015-12-05 ENCOUNTER — Other Ambulatory Visit (HOSPITAL_COMMUNITY): Payer: Self-pay | Admitting: Psychiatry

## 2015-12-05 ENCOUNTER — Ambulatory Visit (INDEPENDENT_AMBULATORY_CARE_PROVIDER_SITE_OTHER): Payer: 59 | Admitting: Psychiatry

## 2015-12-05 VITALS — BP 155/84 | HR 80 | Ht 62.0 in | Wt 164.4 lb

## 2015-12-05 DIAGNOSIS — F331 Major depressive disorder, recurrent, moderate: Secondary | ICD-10-CM | POA: Diagnosis not present

## 2015-12-05 MED ORDER — SERTRALINE HCL 100 MG PO TABS: 100.0000 mg | ORAL_TABLET | Freq: Every day | ORAL | Status: DC

## 2015-12-05 MED ORDER — GABAPENTIN 300 MG PO CAPS: 300.0000 mg | ORAL_CAPSULE | Freq: Three times a day (TID) | ORAL | Status: DC
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
## 2016-02-26 ENCOUNTER — Emergency Department (HOSPITAL_COMMUNITY)
Admission: EM | Admit: 2016-02-26 | Discharge: 2016-02-27 | Disposition: A | Discharge status: Home / Self Care | Payer: 59 | Source: Home / Self Care | Attending: Emergency Medicine | Admitting: Emergency Medicine

## 2016-02-26 ENCOUNTER — Encounter (HOSPITAL_COMMUNITY): Payer: Self-pay | Admitting: Radiology

## 2016-02-26 ENCOUNTER — Emergency Department (HOSPITAL_COMMUNITY): Payer: 59

## 2016-02-26 DIAGNOSIS — S82141A Displaced bicondylar fracture of right tibia, initial encounter for closed fracture: Secondary | ICD-10-CM | POA: Diagnosis not present

## 2016-02-26 DIAGNOSIS — W19XXXA Unspecified fall, initial encounter: Secondary | ICD-10-CM

## 2016-02-26 DIAGNOSIS — I10 Essential (primary) hypertension: Secondary | ICD-10-CM | POA: Insufficient documentation

## 2016-02-26 DIAGNOSIS — Z79899 Other long term (current) drug therapy: Secondary | ICD-10-CM

## 2016-02-26 DIAGNOSIS — Y9289 Other specified places as the place of occurrence of the external cause: Secondary | ICD-10-CM

## 2016-02-26 DIAGNOSIS — Y939 Activity, unspecified: Secondary | ICD-10-CM | POA: Insufficient documentation

## 2016-02-26 DIAGNOSIS — S82121A Displaced fracture of lateral condyle of right tibia, initial encounter for closed fracture: Secondary | ICD-10-CM

## 2016-02-26 DIAGNOSIS — E039 Hypothyroidism, unspecified: Secondary | ICD-10-CM

## 2016-02-26 DIAGNOSIS — Y999 Unspecified external cause status: Secondary | ICD-10-CM | POA: Insufficient documentation

## 2016-02-26 DIAGNOSIS — M25561 Pain in right knee: Secondary | ICD-10-CM | POA: Diagnosis not present

## 2016-02-26 DIAGNOSIS — Z7982 Long term (current) use of aspirin: Secondary | ICD-10-CM | POA: Insufficient documentation

## 2016-02-26 MED ORDER — FENTANYL CITRATE (PF) 100 MCG/2ML IJ SOLN
100.0000 ug | Freq: Once | INTRAMUSCULAR | Status: AC
  Administered 2016-02-26: 100 ug via INTRAVENOUS
  Filled 2016-02-26: qty 2

## 2016-02-26 MED ORDER — FENTANYL CITRATE (PF) 100 MCG/2ML IJ SOLN
50.0000 ug | Freq: Once | INTRAMUSCULAR | Status: AC
  Administered 2016-02-26: 50 ug via INTRAVENOUS
  Filled 2016-02-26: qty 2

## 2016-02-26 MED ORDER — SODIUM CHLORIDE 0.9 % IV BOLUS (SEPSIS)
500.0000 mL | Freq: Once | INTRAVENOUS | Status: AC
  Administered 2016-02-26: 500 mL via INTRAVENOUS

## 2016-02-26 NOTE — ED Provider Notes (Addendum)
[facility] DEPT Provider Note   CSN: [id number] Arrival date & time: 02/26/16  2018     History   Chief Complaint Chief Complaint  Patient presents with  . Leg Pain    HPI Jessica [name] is a 65 y.o. female who had a mechanical fall in her driveway about 4:76 this afternoon. She was seen at [facility] and diagnosed with a skin tear of the right forearm and a tibial plateau fracture of the right knee. She was placed in a knee immobilizer and was given morphine IV which caused her pressure to drop. She was discharged with a prescription for Percocet which has not been filled yet. She is here for second opinion given that her pain is not adequately controlled. She is currently rating her pain as a 10 out of 10, worse with attempted movement or palpation of the right knee.  HPI  Past Medical History:  Diagnosis Date  . Anxiety attack   . Arthritis   . Back pain, chronic   . Crohn's colitis ([place])   . Depression (emotion)   . GERD (gastroesophageal reflux disease)   . Heart murmur    hx of years ago   . Hypertension   . Hypothyroid   . Neuropathy ([place])   . Sleep apnea    cpap is not working per patient - needs to find a new doctor - not used since 7/16 per pat     Patient Active Problem List   Diagnosis Date Noted  . Acute medial meniscal tear 03/28/2015  . Crohn's colitis ([place]) 02/10/2013  . Unspecified hypothyroidism 02/10/2013  . Neuropathy ([place]) 02/10/2013    Past Surgical History:  Procedure Laterality Date  . ABDOMINAL HYSTERECTOMY    . CHOLECYSTECTOMY    . COLON SURGERY     for Crohn's in the 59s in [place]  . COLONOSCOPY N/A 03/11/2013   Procedure: COLONOSCOPY;  Surgeon: Rogene Houston, MD;  [facility];  Service: Endoscopy;  Laterality: N/A;  1200  . fatty tumor removed    . Hip replacement      Rt hip in 2010 in [place]  . KNEE ARTHROSCOPY Left 03/29/2015   Procedure: ARTHROSCOPY LEFT KNEE WITH MENSICAL DEBRIDEMENT, chondroplasty;   Surgeon: Gaynelle Arabian, MD;  Location: [facility];  Service: Orthopedics;  Laterality: Left;    OB History    No data available       Home Medications    Prior to Admission medications   Medication Sig Start Date End Date Taking? Authorizing Provider  aspirin EC 81 MG tablet Take 81 mg by mouth daily.   Yes Historical Provider, MD  cetirizine (ZYRTEC) 10 MG tablet Take 10 mg by mouth daily.   Yes Historical Provider, MD  diphenoxylate-atropine (LOMOTIL) 2.5-0.025 MG per tablet Take 1 tablet by mouth 3 (three) times daily as needed for diarrhea or loose stools. 09/13/14  Yes Rogene Houston, MD  fluticasone (FLONASE) 50 MCG/ACT nasal spray Place 2 sprays into the nose 2 (two) times daily as needed for allergies.    Yes Historical Provider, MD  gabapentin (NEURONTIN) 300 MG capsule Take 1 capsule (300 mg total) by mouth 3 (three) times daily. Patient taking differently: Take 300 mg by mouth 3 (three) times daily as needed (pain).  12/05/15  Yes Cloria Spring, MD  HYDROcodone-acetaminophen (NORCO/VICODIN) 5-325 MG tablet Take 1 tablet by mouth 2 (two) times daily as needed for moderate pain.  02/13/16  Yes Historical Provider, MD  levothyroxine (  SYNTHROID, LEVOTHROID) 100 MCG tablet Take 100 mcg by mouth daily before breakfast.  10/25/15  Yes Historical Provider, MD  lisinopril (PRINIVIL,ZESTRIL) 20 MG tablet Take 20 mg by mouth daily.   Yes Historical Provider, MD  Multiple Vitamin (MULTIVITAMIN WITH MINERALS) TABS tablet Take 1 tablet by mouth daily.   Yes Historical Provider, MD  omeprazole (PRILOSEC) 20 MG capsule Take 20 mg by mouth 2 (two) times daily.    Yes Historical Provider, MD  promethazine (PHENERGAN) 25 MG tablet Take 25 mg by mouth daily as needed for nausea or vomiting.  02/16/16  Yes Historical Provider, MD  sertraline (ZOLOFT) 100 MG tablet Take 1 tablet (100 mg total) by mouth at bedtime. 12/05/15  Yes Cloria Spring, MD  triamterene-hydrochlorothiazide (MAXZIDE-25) 37.5-25 MG tablet  Take 1 tablet by mouth daily. 02/13/16  Yes Historical Provider, MD    Family History Family History  Problem Relation Age of Onset  . Crohn's disease Brother   . Colon cancer Neg Hx   . Depression Mother   . Anxiety disorder Mother   . Depression Sister   . Aneurysm Father     Social History Social History  Substance Use Topics  . Smoking status: Never Smoker  . Smokeless tobacco: Never Used  . Alcohol use No     Allergies   Morphine and related   Review of Systems Review of Systems  All other systems reviewed and are negative.    Physical Exam Updated Vital Signs BP 123/73   Pulse 81   Temp 98.6 F (37 C) (Oral)   Resp 13   Ht 5' 2"  (1.575 m)   Wt 166 lb (75.3 kg)   SpO2 99%   BMI 30.36 kg/m   Physical Exam General: Well-developed, well-nourished female in no acute distress; appearance consistent with age of record HENT: normocephalic; atraumatic Eyes: pupils equal, round and reactive to light; extraocular muscles intact Neck: supple; nontender Heart: regular rate and rhythm Lungs: clear to auscultation bilaterally Abdomen: soft; nondistended; nontender; bowel sounds present Extremities: No deformity; right knee and knee immobilizers; profound tenderness of medial right knee with pain on attempted movement, right lower extremity distally neurovascularly intact Neurologic: Awake, alert and oriented; motor function intact in all extremities and symmetric; no facial droop Skin: Warm and dry Psychiatric: Flat affect    ED Treatments / Results  Nursing notes and vitals signs, including pulse oximetry, reviewed.  Summary of this visit's results, reviewed by myself:  Imaging Studies: Ct Knee Right Wo Contrast  Result Date: 02/26/2016 CLINICAL DATA:  Tibial plateau fracture. EXAM: CT OF THE RIGHT KNEE WITHOUT CONTRAST TECHNIQUE: Multidetector CT imaging of the RIGHT knee was performed according to the standard protocol. Multiplanar CT image reconstructions  were also generated. COMPARISON:  Radiographs earlier this day FINDINGS: Bones/Joint/Cartilage Impaction fracture of the lateral tibial plateau. Large central articular surface depression of 17 mm. Gap at the articular surface measures 26 x 21 mm in transverse by AP dimension. Fracture extends to the lateral aspect of the tibial spine. There is a small vertically-oriented anterior component. No involvement of the medial tibial plateau. Proximal fibula, distal femur and patella are intact. Moderate to large lipohemarthrosis. Ligaments Tibial attachment of anterior cruciate ligament abuts the medial most fracture plane, however fibers are grossly intact. Posterior cruciate ligament is intact. Suboptimally assessed by CT. Muscles and Tendons Quadriceps and patellar tendons are intact. No intramuscular hematoma. Soft tissues Soft tissue edema anteriorly. IMPRESSION: Comminuted impaction fracture of the lateral tibial plateau  with large central depression and articular surface involvement. Associated lipohemarthrosis. Electronically Signed   By: Jeb Levering M.D.   On: 02/26/2016 22:04   11:09 PM Pain treated with IV fentanyl. We'll have patient transported home by [facility]. She is an established patient of Dr. Binnie Rail or [place].  Procedures (including critical care time)  Final Clinical Impressions(s) / ED Diagnoses   Final diagnoses:  Tibial plateau fracture, right, closed, initial encounter   Dictation #1 [id number]  [id number]    Shanon Rosser, MD 02/26/16 [place], MD 02/26/16 2320

## 2016-02-26 NOTE — ED Notes (Signed)
[facility] called  

## 2016-02-26 NOTE — ED Triage Notes (Signed)
Pt was BIB husband. Pt just left [facility] after she fell today around 3pm. States that she has a R tibial plateau fracture and was told that she would probably require surgery but she is in such pain that she came here for a re-evaluation. Alert and oriented.

## 2016-02-27 MED ORDER — OXYCODONE-ACETAMINOPHEN 5-325 MG PO TABS
1.0000 | ORAL_TABLET | Freq: Once | ORAL | Status: AC
  Administered 2016-02-27: 1 via ORAL
  Filled 2016-02-27: qty 1

## 2016-02-28 ENCOUNTER — Encounter (HOSPITAL_COMMUNITY): Payer: Self-pay | Admitting: *Deleted

## 2016-02-29 ENCOUNTER — Ambulatory Visit (HOSPITAL_COMMUNITY): Payer: 59 | Admitting: Anesthesiology

## 2016-02-29 ENCOUNTER — Encounter (HOSPITAL_COMMUNITY)
Admission: AD | Disposition: A | Discharge status: Skilled Nursing Facility | Payer: Self-pay | Source: Ambulatory Visit | Attending: Orthopedic Surgery

## 2016-02-29 ENCOUNTER — Inpatient Hospital Stay (HOSPITAL_COMMUNITY)
Admission: AD | Admit: 2016-02-29 | Discharge: 2016-03-04 | DRG: 493 | Disposition: A | Discharge status: Skilled Nursing Facility | Payer: 59 | Source: Ambulatory Visit | Attending: Orthopedic Surgery | Admitting: Orthopedic Surgery

## 2016-02-29 ENCOUNTER — Encounter (HOSPITAL_COMMUNITY): Payer: Self-pay | Admitting: *Deleted

## 2016-02-29 ENCOUNTER — Ambulatory Visit (HOSPITAL_COMMUNITY): Payer: 59

## 2016-02-29 DIAGNOSIS — Z79899 Other long term (current) drug therapy: Secondary | ICD-10-CM | POA: Diagnosis not present

## 2016-02-29 DIAGNOSIS — F329 Major depressive disorder, single episode, unspecified: Secondary | ICD-10-CM | POA: Diagnosis present

## 2016-02-29 DIAGNOSIS — G4730 Sleep apnea, unspecified: Secondary | ICD-10-CM | POA: Diagnosis present

## 2016-02-29 DIAGNOSIS — Z8379 Family history of other diseases of the digestive system: Secondary | ICD-10-CM

## 2016-02-29 DIAGNOSIS — K219 Gastro-esophageal reflux disease without esophagitis: Secondary | ICD-10-CM | POA: Diagnosis present

## 2016-02-29 DIAGNOSIS — F411 Generalized anxiety disorder: Secondary | ICD-10-CM | POA: Diagnosis present

## 2016-02-29 DIAGNOSIS — M549 Dorsalgia, unspecified: Secondary | ICD-10-CM | POA: Diagnosis present

## 2016-02-29 DIAGNOSIS — G8929 Other chronic pain: Secondary | ICD-10-CM | POA: Diagnosis present

## 2016-02-29 DIAGNOSIS — S82143A Displaced bicondylar fracture of unspecified tibia, initial encounter for closed fracture: Secondary | ICD-10-CM | POA: Diagnosis present

## 2016-02-29 DIAGNOSIS — G629 Polyneuropathy, unspecified: Secondary | ICD-10-CM | POA: Diagnosis present

## 2016-02-29 DIAGNOSIS — Z882 Allergy status to sulfonamides status: Secondary | ICD-10-CM | POA: Diagnosis not present

## 2016-02-29 DIAGNOSIS — K5010 Crohn's disease of large intestine without complications: Secondary | ICD-10-CM | POA: Diagnosis present

## 2016-02-29 DIAGNOSIS — W1839XA Other fall on same level, initial encounter: Secondary | ICD-10-CM | POA: Diagnosis present

## 2016-02-29 DIAGNOSIS — S82141A Displaced bicondylar fracture of right tibia, initial encounter for closed fracture: Principal | ICD-10-CM | POA: Diagnosis present

## 2016-02-29 DIAGNOSIS — Z7982 Long term (current) use of aspirin: Secondary | ICD-10-CM | POA: Diagnosis not present

## 2016-02-29 DIAGNOSIS — Z885 Allergy status to narcotic agent status: Secondary | ICD-10-CM

## 2016-02-29 DIAGNOSIS — I10 Essential (primary) hypertension: Secondary | ICD-10-CM | POA: Diagnosis present

## 2016-02-29 DIAGNOSIS — E039 Hypothyroidism, unspecified: Secondary | ICD-10-CM | POA: Diagnosis present

## 2016-02-29 DIAGNOSIS — Z818 Family history of other mental and behavioral disorders: Secondary | ICD-10-CM | POA: Diagnosis not present

## 2016-02-29 DIAGNOSIS — S82121A Displaced fracture of lateral condyle of right tibia, initial encounter for closed fracture: Secondary | ICD-10-CM | POA: Diagnosis present

## 2016-02-29 DIAGNOSIS — Z419 Encounter for procedure for purposes other than remedying health state, unspecified: Secondary | ICD-10-CM

## 2016-02-29 DIAGNOSIS — M25561 Pain in right knee: Secondary | ICD-10-CM | POA: Diagnosis present

## 2016-02-29 HISTORY — DX: Dizziness and giddiness: R42

## 2016-02-29 HISTORY — PX: ORIF TIBIA PLATEAU: SHX2132

## 2016-02-29 LAB — BASIC METABOLIC PANEL
ANION GAP: 6 (ref 5–15)
BUN: 26 mg/dL — AB (ref 6–20)
CHLORIDE: 109 mmol/L (ref 101–111)
CO2: 20 mmol/L — ABNORMAL LOW (ref 22–32)
Calcium: 9.1 mg/dL (ref 8.9–10.3)
Creatinine, Ser: 1.14 mg/dL — ABNORMAL HIGH (ref 0.44–1.00)
GFR calc Af Amer: 58 mL/min — ABNORMAL LOW (ref >60)
GFR, EST NON AFRICAN AMERICAN: 50 mL/min — AB (ref >60)
Glucose, Bld: 90 mg/dL (ref 65–99)
POTASSIUM: 3.6 mmol/L (ref 3.5–5.1)
SODIUM: 135 mmol/L (ref 135–145)

## 2016-02-29 LAB — TYPE AND SCREEN
ABO/RH(D): O POS
ANTIBODY SCREEN: NEGATIVE

## 2016-02-29 LAB — CBC
HCT: 31.8 % — ABNORMAL LOW (ref 36.0–46.0)
HEMOGLOBIN: 11.2 g/dL — AB (ref 12.0–15.0)
MCH: 32 pg (ref 26.0–34.0)
MCHC: 35.2 g/dL (ref 30.0–36.0)
MCV: 90.9 fL (ref 78.0–100.0)
Platelets: 214 10*3/uL (ref 150–400)
RBC: 3.5 MIL/uL — AB (ref 3.87–5.11)
RDW: 12.8 % (ref 11.5–15.5)
WBC: 11.8 10*3/uL — AB (ref 4.0–10.5)

## 2016-02-29 SURGERY — OPEN REDUCTION INTERNAL FIXATION (ORIF) TIBIAL PLATEAU
Anesthesia: General | Laterality: Right

## 2016-02-29 MED ORDER — OXYCODONE HCL 5 MG/5ML PO SOLN: 5.0000 mg | Freq: Once | ORAL | Status: DC | PRN

## 2016-02-29 MED ORDER — DEXAMETHASONE SODIUM PHOSPHATE 10 MG/ML IJ SOLN
8.0000 mg | Freq: Once | INTRAMUSCULAR | Status: AC
  Administered 2016-02-29: 8 mg via INTRAVENOUS

## 2016-02-29 MED ORDER — CEFAZOLIN SODIUM-DEXTROSE 2-4 GM/100ML-% IV SOLN
2.0000 g | INTRAVENOUS | Status: AC
  Administered 2016-02-29: 2 g via INTRAVENOUS

## 2016-02-29 MED ORDER — FENTANYL CITRATE (PF) 250 MCG/5ML IJ SOLN
INTRAMUSCULAR | Status: AC
  Filled 2016-02-29: qty 5

## 2016-02-29 MED ORDER — METOCLOPRAMIDE HCL 5 MG PO TABS: 5.0000 mg | ORAL_TABLET | Freq: Three times a day (TID) | ORAL | Status: DC | PRN

## 2016-02-29 MED ORDER — BISACODYL 10 MG RE SUPP: 10.0000 mg | Freq: Every day | RECTAL | Status: DC | PRN

## 2016-02-29 MED ORDER — ROCURONIUM BROMIDE 100 MG/10ML IV SOLN
INTRAVENOUS | Status: AC
  Filled 2016-02-29: qty 1

## 2016-02-29 MED ORDER — PANTOPRAZOLE SODIUM 40 MG PO TBEC
40.0000 mg | DELAYED_RELEASE_TABLET | Freq: Two times a day (BID) | ORAL | Status: DC
Start: 2016-03-01 — End: 2016-03-04
  Administered 2016-03-01 – 2016-03-04 (×7): 40 mg via ORAL
  Filled 2016-02-29 (×7): qty 1

## 2016-02-29 MED ORDER — LEVOTHYROXINE SODIUM 100 MCG PO TABS
100.0000 ug | ORAL_TABLET | Freq: Every day | ORAL | Status: DC
  Administered 2016-03-01 – 2016-03-04 (×4): 100 ug via ORAL
  Filled 2016-02-29 (×4): qty 1

## 2016-02-29 MED ORDER — MIDAZOLAM HCL 2 MG/2ML IJ SOLN
INTRAMUSCULAR | Status: AC
  Filled 2016-02-29: qty 2

## 2016-02-29 MED ORDER — HYDROMORPHONE HCL 1 MG/ML IJ SOLN: 0.2500 mg | INTRAMUSCULAR | Status: DC | PRN

## 2016-02-29 MED ORDER — ONDANSETRON HCL 4 MG/2ML IJ SOLN: 4.0000 mg | Freq: Four times a day (QID) | INTRAMUSCULAR | Status: DC | PRN

## 2016-02-29 MED ORDER — POLYETHYLENE GLYCOL 3350 17 G PO PACK
17.0000 g | PACK | Freq: Every day | ORAL | Status: DC | PRN
Start: 2016-02-29 — End: 2016-03-04

## 2016-02-29 MED ORDER — DOCUSATE SODIUM 100 MG PO CAPS
100.0000 mg | ORAL_CAPSULE | Freq: Two times a day (BID) | ORAL | Status: DC
  Administered 2016-02-29 – 2016-03-04 (×7): 100 mg via ORAL
  Filled 2016-02-29 (×8): qty 1

## 2016-02-29 MED ORDER — MIDAZOLAM HCL 5 MG/5ML IJ SOLN
INTRAMUSCULAR | Status: DC | PRN
  Administered 2016-02-29: 2 mg via INTRAVENOUS

## 2016-02-29 MED ORDER — DEXAMETHASONE SODIUM PHOSPHATE 10 MG/ML IJ SOLN
INTRAMUSCULAR | Status: AC
  Filled 2016-02-29: qty 1

## 2016-02-29 MED ORDER — OXYCODONE HCL 5 MG PO TABS: 5.0000 mg | ORAL_TABLET | Freq: Once | ORAL | Status: DC | PRN

## 2016-02-29 MED ORDER — BUPIVACAINE HCL (PF) 0.25 % IJ SOLN
INTRAMUSCULAR | Status: AC
  Filled 2016-02-29: qty 30

## 2016-02-29 MED ORDER — HYDROMORPHONE HCL 1 MG/ML IJ SOLN
0.5000 mg | INTRAMUSCULAR | Status: DC | PRN
  Administered 2016-02-29 – 2016-03-02 (×3): 0.5 mg via INTRAVENOUS
  Filled 2016-02-29 (×3): qty 1

## 2016-02-29 MED ORDER — PROPOFOL 10 MG/ML IV BOLUS
INTRAVENOUS | Status: DC | PRN
  Administered 2016-02-29: 130 mg via INTRAVENOUS
  Administered 2016-02-29: 70 mg via INTRAVENOUS

## 2016-02-29 MED ORDER — FLUTICASONE PROPIONATE 50 MCG/ACT NA SUSP
2.0000 | Freq: Two times a day (BID) | NASAL | Status: DC | PRN
  Filled 2016-02-29: qty 16

## 2016-02-29 MED ORDER — LIDOCAINE HCL (CARDIAC) 20 MG/ML IV SOLN
INTRAVENOUS | Status: DC | PRN
  Administered 2016-02-29: 30 mg via INTRAVENOUS

## 2016-02-29 MED ORDER — OXYCODONE HCL 5 MG PO TABS
5.0000 mg | ORAL_TABLET | ORAL | Status: DC | PRN
  Administered 2016-02-29 – 2016-03-01 (×3): 10 mg via ORAL
  Administered 2016-03-01: 5 mg via ORAL
  Administered 2016-03-01 – 2016-03-04 (×17): 10 mg via ORAL
  Filled 2016-02-29 (×21): qty 2

## 2016-02-29 MED ORDER — ONDANSETRON HCL 4 MG/2ML IJ SOLN
INTRAMUSCULAR | Status: AC
  Filled 2016-02-29: qty 2

## 2016-02-29 MED ORDER — CEFAZOLIN SODIUM-DEXTROSE 2-4 GM/100ML-% IV SOLN
2.0000 g | Freq: Four times a day (QID) | INTRAVENOUS | Status: AC
  Administered 2016-02-29 – 2016-03-01 (×3): 2 g via INTRAVENOUS
  Filled 2016-02-29 (×3): qty 100

## 2016-02-29 MED ORDER — GABAPENTIN 300 MG PO CAPS: 300.0000 mg | ORAL_CAPSULE | Freq: Three times a day (TID) | ORAL | Status: DC | PRN

## 2016-02-29 MED ORDER — DIPHENOXYLATE-ATROPINE 2.5-0.025 MG PO TABS: 1.0000 | ORAL_TABLET | Freq: Three times a day (TID) | ORAL | Status: DC | PRN

## 2016-02-29 MED ORDER — ACETAMINOPHEN 10 MG/ML IV SOLN
INTRAVENOUS | Status: AC
  Filled 2016-02-29: qty 100

## 2016-02-29 MED ORDER — DIPHENHYDRAMINE HCL 50 MG/ML IJ SOLN: 12.5000 mg | INTRAMUSCULAR | Status: DC | PRN

## 2016-02-29 MED ORDER — LORATADINE 10 MG PO TABS
10.0000 mg | ORAL_TABLET | Freq: Every day | ORAL | Status: DC
  Administered 2016-03-01 – 2016-03-04 (×4): 10 mg via ORAL
  Filled 2016-02-29 (×4): qty 1

## 2016-02-29 MED ORDER — ENOXAPARIN SODIUM 40 MG/0.4ML ~~LOC~~ SOLN
40.0000 mg | SUBCUTANEOUS | Status: DC
  Administered 2016-03-01 – 2016-03-04 (×4): 40 mg via SUBCUTANEOUS
  Filled 2016-02-29 (×4): qty 0.4

## 2016-02-29 MED ORDER — SERTRALINE HCL 50 MG PO TABS
100.0000 mg | ORAL_TABLET | Freq: Every day | ORAL | Status: DC
  Administered 2016-02-29 – 2016-03-03 (×4): 100 mg via ORAL
  Filled 2016-02-29 (×4): qty 2

## 2016-02-29 MED ORDER — PROMETHAZINE HCL 25 MG PO TABS: 25.0000 mg | ORAL_TABLET | Freq: Every day | ORAL | Status: DC | PRN

## 2016-02-29 MED ORDER — FLEET ENEMA 7-19 GM/118ML RE ENEM: 1.0000 | ENEMA | Freq: Once | RECTAL | Status: DC | PRN

## 2016-02-29 MED ORDER — LACTATED RINGERS IV SOLN
INTRAVENOUS | Status: DC
  Administered 2016-02-29: 17:00:00 via INTRAVENOUS

## 2016-02-29 MED ORDER — TRIAMTERENE-HCTZ 37.5-25 MG PO TABS
1.0000 | ORAL_TABLET | Freq: Every day | ORAL | Status: DC
  Administered 2016-03-02 – 2016-03-04 (×3): 1 via ORAL
  Filled 2016-02-29 (×4): qty 1

## 2016-02-29 MED ORDER — ONDANSETRON HCL 4 MG/2ML IJ SOLN
INTRAMUSCULAR | Status: DC | PRN
  Administered 2016-02-29: 4 mg via INTRAVENOUS

## 2016-02-29 MED ORDER — PROMETHAZINE HCL 25 MG/ML IJ SOLN
6.2500 mg | INTRAMUSCULAR | Status: DC | PRN
Start: 2016-02-29 — End: 2016-02-29

## 2016-02-29 MED ORDER — FENTANYL CITRATE (PF) 100 MCG/2ML IJ SOLN
INTRAMUSCULAR | Status: DC | PRN
  Administered 2016-02-29 (×5): 50 ug via INTRAVENOUS

## 2016-02-29 MED ORDER — SODIUM CHLORIDE 0.9 % IV SOLN
INTRAVENOUS | Status: DC
  Administered 2016-02-29 – 2016-03-01 (×2): via INTRAVENOUS

## 2016-02-29 MED ORDER — ACETAMINOPHEN 10 MG/ML IV SOLN
INTRAVENOUS | Status: DC | PRN
  Administered 2016-02-29: 1000 mg via INTRAVENOUS

## 2016-02-29 MED ORDER — METHOCARBAMOL 500 MG PO TABS
500.0000 mg | ORAL_TABLET | Freq: Four times a day (QID) | ORAL | Status: DC | PRN
Start: 2016-02-29 — End: 2016-03-04
  Administered 2016-03-01 – 2016-03-04 (×2): 500 mg via ORAL
  Filled 2016-02-29 (×2): qty 1

## 2016-02-29 MED ORDER — CHLORHEXIDINE GLUCONATE 4 % EX LIQD: 60.0000 mL | Freq: Once | CUTANEOUS | Status: DC

## 2016-02-29 MED ORDER — ONDANSETRON HCL 4 MG PO TABS: 4.0000 mg | ORAL_TABLET | Freq: Four times a day (QID) | ORAL | Status: DC | PRN

## 2016-02-29 MED ORDER — METHOCARBAMOL 1000 MG/10ML IJ SOLN
500.0000 mg | Freq: Four times a day (QID) | INTRAVENOUS | Status: DC | PRN
  Administered 2016-02-29: 500 mg via INTRAVENOUS
  Filled 2016-02-29: qty 550
  Filled 2016-02-29: qty 5

## 2016-02-29 MED ORDER — CEFAZOLIN SODIUM-DEXTROSE 2-4 GM/100ML-% IV SOLN
INTRAVENOUS | Status: AC
  Filled 2016-02-29: qty 100

## 2016-02-29 MED ORDER — ACETAMINOPHEN 500 MG PO TABS
1000.0000 mg | ORAL_TABLET | Freq: Four times a day (QID) | ORAL | Status: AC
  Administered 2016-02-29 – 2016-03-01 (×4): 1000 mg via ORAL
  Filled 2016-02-29 (×4): qty 2

## 2016-02-29 MED ORDER — METOCLOPRAMIDE HCL 5 MG/ML IJ SOLN: 5.0000 mg | Freq: Three times a day (TID) | INTRAMUSCULAR | Status: DC | PRN

## 2016-02-29 SURGICAL SUPPLY — 67 items
BAG SPEC THK2 15X12 ZIP CLS (MISCELLANEOUS)
BAG ZIPLOCK 12X15 (MISCELLANEOUS) ×1 IMPLANT
BANDAGE ESMARK 6X9 LF (GAUZE/BANDAGES/DRESSINGS) ×1 IMPLANT
BIT DRILL 100X2.5XANTM LCK (BIT) IMPLANT
BIT DRILL CAL (BIT) IMPLANT
BIT DRILL SLEEVE MEASURING (BIT) IMPLANT
BIT DRL 100X2.5XANTM LCK (BIT) ×1
BNDG CMPR 9X6 STRL LF SNTH (GAUZE/BANDAGES/DRESSINGS) ×1
BNDG ESMARK 6X9 LF (GAUZE/BANDAGES/DRESSINGS) ×2
BONE CHIP PRESERV 30CC PCAN30 (Bone Implant) ×2 IMPLANT
CUFF TOURN SGL QUICK 34 (TOURNIQUET CUFF) ×2
CUFF TRNQT CYL 34X4X40X1 (TOURNIQUET CUFF) ×1 IMPLANT
DRAPE C-ARM 42X120 X-RAY (DRAPES) ×2 IMPLANT
DRAPE INCISE IOBAN 66X45 STRL (DRAPES) ×1 IMPLANT
DRAPE SHEET LG 3/4 BI-LAMINATE (DRAPES) ×2 IMPLANT
DRAPE U-SHAPE 47X51 STRL (DRAPES) ×2 IMPLANT
DRILL BIT 2.5MM (BIT) ×2
DRILL BIT CAL (BIT) ×2
DRILL SLEEVE MEASURING (BIT) ×2
DRSG EMULSION OIL 3X16 NADH (GAUZE/BANDAGES/DRESSINGS) ×1 IMPLANT
DURAPREP 26ML APPLICATOR (WOUND CARE) ×2 IMPLANT
ELECT REM PT RETURN 9FT ADLT (ELECTROSURGICAL) ×2
ELECTRODE REM PT RTRN 9FT ADLT (ELECTROSURGICAL) ×1 IMPLANT
EVACUATOR 1/8 PVC DRAIN (DRAIN) ×1 IMPLANT
GLOVE BIO SURGEON STRL SZ7.5 (GLOVE) ×7 IMPLANT
GLOVE BIO SURGEON STRL SZ8 (GLOVE) ×2 IMPLANT
GLOVE BIOGEL PI IND STRL 8 (GLOVE) ×3 IMPLANT
GLOVE BIOGEL PI INDICATOR 8 (GLOVE) ×3
GOWN STRL REUS W/TWL LRG LVL3 (GOWN DISPOSABLE) ×2 IMPLANT
GOWN STRL REUS W/TWL XL LVL3 (GOWN DISPOSABLE) ×3 IMPLANT
GRAFT BNE CANC CHIPS 1-8 30CC (Bone Implant) IMPLANT
IMMOBILIZER KNEE 20 (SOFTGOODS) ×2
IMMOBILIZER KNEE 20 THIGH 36 (SOFTGOODS) ×1 IMPLANT
K-WIRE ACE 1.6X6 (WIRE) ×4
KIT BASIN OR (CUSTOM PROCEDURE TRAY) ×2 IMPLANT
KWIRE ACE 1.6X6 (WIRE) IMPLANT
MANIFOLD NEPTUNE II (INSTRUMENTS) ×2 IMPLANT
NS IRRIG 1000ML POUR BTL (IV SOLUTION) ×2 IMPLANT
PACK TOTAL JOINT (CUSTOM PROCEDURE TRAY) ×2 IMPLANT
PAD CAST 4YDX4 CTTN HI CHSV (CAST SUPPLIES) ×2 IMPLANT
PADDING CAST COTTON 4X4 STRL (CAST SUPPLIES) ×2
PADDING CAST COTTON 6X4 STRL (CAST SUPPLIES) ×3 IMPLANT
PLATE LOCK 5H STD RT PROX TIB (Plate) ×1 IMPLANT
POSITIONER SURGICAL ARM (MISCELLANEOUS) ×2 IMPLANT
PUTTY DBM STAGRAFT PLUS 5CC (Putty) ×1 IMPLANT
SCREW CORT T15 TPR 55X3.5XST (Screw) IMPLANT
SCREW CORTICAL 3.5MM  34MM (Screw) ×1 IMPLANT
SCREW CORTICAL 3.5MM 34MM (Screw) IMPLANT
SCREW CORTICAL 3.5MM 38MM (Screw) ×1 IMPLANT
SCREW CORTICAL 3.5X55MM (Screw) ×4 IMPLANT
SCREW LOCK 3.5X65 DIST TIB (Screw) ×1 IMPLANT
SCREW LOCK 3.5X70 DIST TIB (Screw) ×1 IMPLANT
SCREW LOCK CANC STAR 4X75 (Screw) ×1 IMPLANT
SCREW LOCK CORT STAR 3.5X56 (Screw) ×1 IMPLANT
SCREW LP 3.5X60MM (Screw) ×1 IMPLANT
SPONGE LAP 18X18 X RAY DECT (DISPOSABLE) ×2 IMPLANT
STOCKINETTE 8 INCH (MISCELLANEOUS) ×2 IMPLANT
STRIP CLOSURE SKIN 1/2X4 (GAUZE/BANDAGES/DRESSINGS) ×2 IMPLANT
SUCTION FRAZIER HANDLE 10FR (MISCELLANEOUS) ×1
SUCTION TUBE FRAZIER 10FR DISP (MISCELLANEOUS) ×1 IMPLANT
SUT ETHILON 4 0 PS 2 18 (SUTURE) ×2 IMPLANT
SUT MNCRL AB 4-0 PS2 18 (SUTURE) ×2 IMPLANT
SUT VIC AB 1 CT1 27 (SUTURE) ×2
SUT VIC AB 1 CT1 27XBRD ANTBC (SUTURE) ×5 IMPLANT
SUT VIC AB 2-0 CT2 27 (SUTURE) ×4 IMPLANT
TOWEL OR 17X26 10 PK STRL BLUE (TOWEL DISPOSABLE) ×3 IMPLANT
WATER STERILE IRR 1500ML POUR (IV SOLUTION) ×2 IMPLANT

## 2016-02-29 NOTE — Interval H&P Note (Signed)
History and Physical Interval Note:  02/29/2016 5:03 PM  Jessica [name]  has presented today for surgery, with the diagnosis of RIGHT TIBIAL PLATEAU  FRACTURE  The various methods of treatment have been discussed with the patient and family. After consideration of risks, benefits and other options for treatment, the patient has consented to  Procedure(s): OPEN REDUCTION INTERNAL FIXATION (ORIF RIGHT  TIBIAL PLATEAU FRACTURE (Right) as a surgical intervention .  The patient's history has been reviewed, patient examined, no change in status, stable for surgery.  I have reviewed the patient's chart and labs.  Questions were answered to the patient's satisfaction.     Gearlean Alf

## 2016-02-29 NOTE — Progress Notes (Signed)
Pt has noted dsg to left upper arm that she came with into hospital.. Pt has right hand brace on upon arrival to hospital. Pt has noted brusies on left and right upper arms.

## 2016-02-29 NOTE — Anesthesia Postprocedure Evaluation (Signed)
Anesthesia Post Note  Patient: Sharlet Salina  Procedure(s) Performed: Procedure(s) (LRB): OPEN REDUCTION INTERNAL FIXATION (ORIF RIGHT  TIBIAL PLATEAU FRACTURE (Right)  Patient location during evaluation: PACU Anesthesia Type: General Level of consciousness: awake and alert Pain management: pain level controlled Vital Signs Assessment: post-procedure vital signs reviewed and stable Respiratory status: spontaneous breathing, nonlabored ventilation, respiratory function stable and patient connected to nasal cannula oxygen Cardiovascular status: blood pressure returned to baseline and stable Postop Assessment: no signs of nausea or vomiting Anesthetic complications: no    Last Vitals:  Vitals:   02/29/16 1424  BP: 112/75  Pulse: 75  Resp: 18  Temp: 36.7 C    Last Pain:  Vitals:   02/29/16 2040  TempSrc:   PainSc: (P) 10-Worst pain ever        RLE Motor Response: (P) Purposeful movement;Responds to commands (02/29/16 2040) RLE Sensation: (P) Full sensation;No numbness;No tingling (02/29/16 2040)   R Sensory Level: (P) S1-Sole of foot, small toes (02/29/16 2040)  Zenaida Deed

## 2016-02-29 NOTE — Anesthesia Preprocedure Evaluation (Signed)
Anesthesia Evaluation  Patient identified by MRN, date of birth, ID band Patient awake    Reviewed: Allergy & Precautions, NPO status , Patient's Chart, lab work & pertinent test results  Airway Mallampati: II  TM Distance: >3 FB Neck ROM: Full    Dental no notable dental hx. (+) Missing, Loose, Caps,    Pulmonary sleep apnea ,    Pulmonary exam normal breath sounds clear to auscultation       Cardiovascular hypertension, Pt. on medications Normal cardiovascular exam Rhythm:Regular Rate:Normal     Neuro/Psych PSYCHIATRIC DISORDERS Anxiety Depression negative neurological ROS     GI/Hepatic Neg liver ROS, GERD  Medicated and Controlled,  Endo/Other  Hypothyroidism   Renal/GU negative Renal ROS  negative genitourinary   Musculoskeletal negative musculoskeletal ROS (+)   Abdominal   Peds negative pediatric ROS (+)  Hematology negative hematology ROS (+)   Anesthesia Other Findings   Reproductive/Obstetrics negative OB ROS                             Anesthesia Physical  Anesthesia Plan  ASA: II  Anesthesia Plan: General   Post-op Pain Management:    Induction: Intravenous  Airway Management Planned: Oral ETT  Additional Equipment:   Intra-op Plan:   Post-operative Plan: Extubation in OR  Informed Consent: I have reviewed the patients History and Physical, chart, labs and discussed the procedure including the risks, benefits and alternatives for the proposed anesthesia with the patient or authorized representative who has indicated his/her understanding and acceptance.   Dental advisory given  Plan Discussed with: CRNA  Anesthesia Plan Comments:         Anesthesia Quick Evaluation

## 2016-02-29 NOTE — Anesthesia Procedure Notes (Signed)
Procedure Name: Intubation Date/Time: 02/29/2016 6:40 PM Performed by: Lajuana Carry E Pre-anesthesia Checklist: Patient identified, Emergency Drugs available, Suction available and Patient being monitored Patient Re-evaluated:Patient Re-evaluated prior to inductionOxygen Delivery Method: Circle system utilized Preoxygenation: Pre-oxygenation with 100% oxygen Intubation Type: IV induction Ventilation: Mask ventilation without difficulty Laryngoscope Size: Mac and 3 Grade View: Grade II Tube type: Oral Number of attempts: 2 Airway Equipment and Method: Stylet Placement Confirmation: ETT inserted through vocal cords under direct vision,  positive ETCO2 and breath sounds checked- equal and bilateral Secured at: 21 cm Tube secured with: Tape Dental Injury: Teeth and Oropharynx as per pre-operative assessment  Difficulty Due To: Difficulty was anticipated and Difficult Airway- due to anterior larynx Comments: First look w/ Sabra Heck 2, cords viewed, unable to pass ETT, second look by Dr. Jillyn Hidden with Mac 3, anterior, but ETT passed. Teeth/lips as pre op

## 2016-02-29 NOTE — H&P (Signed)
Jessica [name] is an 65 y.o. female.   Chief Complaint: Right knee pain HPI: 65 yo female who had a fall 4 days ago landing on a metal trailer hitch with her right knee with immediate pain and inability to bear weight on her right leg. She was initially seen at [facility] then came to [facility] since I fixed her opposite knee. She was diagnosed with a tibial plateau fracture and told to follow up with [place]. She did not have any paresthesias or severe swelling in the leg. She was seen in the office yesterday and is being taken to the OR today for ORIF of this comminuted tibial plateau fracture.  Past Medical History:  Diagnosis Date  . Anxiety attack   . Back pain, chronic   . Crohn's colitis ([place])   . Depression (emotion)   . GERD (gastroesophageal reflux disease)   . Hypertension   . Hypothyroid   . Neuropathy ([place])   . Sleep apnea    cpap is not working per patient - needs to find a new doctor - not used since 7/16 per pat   . Vertigo    followed by Dr Wardell Heath- in [place]     Past Surgical History:  Procedure Laterality Date  . ABDOMINAL HYSTERECTOMY    . CHOLECYSTECTOMY    . COLON SURGERY     for Crohn's in the 71s in [place]  . COLONOSCOPY N/A 03/11/2013   Procedure: COLONOSCOPY;  Surgeon: Rogene Houston, MD;  [facility];  Service: Endoscopy;  Laterality: N/A;  1200  . fatty tumor removed    . Hip replacement      Rt hip in 2010 in [place]  . KNEE ARTHROSCOPY Left 03/29/2015   Procedure: ARTHROSCOPY LEFT KNEE WITH MENSICAL DEBRIDEMENT, chondroplasty;  Surgeon: Gaynelle Arabian, MD;  Location: [facility]S;  Service: Orthopedics;  Laterality: Left;    Family History  Problem Relation Age of Onset  . Crohn's disease Brother   . Depression Mother   . Anxiety disorder Mother   . Depression Sister   . Aneurysm Father   . Colon cancer Neg Hx    Social History:  reports that she has never smoked. She has never used smokeless tobacco. She reports that she does not drink  alcohol or use drugs.  Allergies:  Allergies  Allergen Reactions  . Morphine And Related Itching    May cause blood pressure to drop  . Other     Sulfa drugs- itching     Medications Prior to Admission  Medication Sig Dispense Refill  . aspirin EC 81 MG tablet Take 81 mg by mouth daily.    . cetirizine (ZYRTEC) 10 MG tablet Take 10 mg by mouth daily.    . fluticasone (FLONASE) 50 MCG/ACT nasal spray Place 2 sprays into the nose 2 (two) times daily as needed for allergies.     Marland Kitchen gabapentin (NEURONTIN) 300 MG capsule Take 1 capsule (300 mg total) by mouth 3 (three) times daily. (Patient taking differently: Take 300 mg by mouth 3 (three) times daily as needed (pain). ) 270 capsule 2  . HYDROcodone-acetaminophen (NORCO/VICODIN) 5-325 MG tablet Take 1 tablet by mouth 2 (two) times daily as needed for moderate pain.     Marland Kitchen levothyroxine (SYNTHROID, LEVOTHROID) 100 MCG tablet Take 100 mcg by mouth daily before breakfast.     . lisinopril (PRINIVIL,ZESTRIL) 20 MG tablet Take 20 mg by mouth daily.    . Multiple Vitamin (MULTIVITAMIN WITH MINERALS) TABS  tablet Take 1 tablet by mouth daily.    Marland Kitchen omeprazole (PRILOSEC) 20 MG capsule Take 20 mg by mouth 2 (two) times daily.     . promethazine (PHENERGAN) 25 MG tablet Take 25 mg by mouth daily as needed for nausea or vomiting.     . sertraline (ZOLOFT) 100 MG tablet Take 1 tablet (100 mg total) by mouth at bedtime. 90 tablet 2  . triamterene-hydrochlorothiazide (MAXZIDE-25) 37.5-25 MG tablet Take 1 tablet by mouth daily.    . diphenoxylate-atropine (LOMOTIL) 2.5-0.025 MG per tablet Take 1 tablet by mouth 3 (three) times daily as needed for diarrhea or loose stools. 60 tablet 1    Results for orders placed or performed during the hospital encounter of 02/29/16 (from the past 48 hour(s))  Type and screen Order type and screen if day of surgery is less than 15 days from draw of preadmission visit or order morning of surgery if day of surgery is greater  than 6 days from preadmission visit.     Status: None   Collection Time: 02/29/16  2:32 PM  Result Value Ref Range   ABO/RH(D) O POS    Antibody Screen NEG    Sample Expiration 03/03/2016   CBC     Status: Abnormal   Collection Time: 02/29/16  2:32 PM  Result Value Ref Range   WBC 11.8 (H) 4.0 - 10.5 K/uL   RBC 3.50 (L) 3.87 - 5.11 MIL/uL   Hemoglobin 11.2 (L) 12.0 - 15.0 g/dL   HCT 31.8 (L) 36.0 - 46.0 %   MCV 90.9 78.0 - 100.0 fL   MCH 32.0 26.0 - 34.0 pg   MCHC 35.2 30.0 - 36.0 g/dL   RDW 12.8 11.5 - 15.5 %   Platelets 214 150 - 400 K/uL  Basic metabolic panel     Status: Abnormal   Collection Time: 02/29/16  2:32 PM  Result Value Ref Range   Sodium 135 135 - 145 mmol/L   Potassium 3.6 3.5 - 5.1 mmol/L   Chloride 109 101 - 111 mmol/L   CO2 20 (L) 22 - 32 mmol/L   Glucose, Bld 90 65 - 99 mg/dL   BUN 26 (H) 6 - 20 mg/dL   Creatinine, Ser 1.14 (H) 0.44 - 1.00 mg/dL   Calcium 9.1 8.9 - 10.3 mg/dL   GFR calc non Af Amer 50 (L) >60 mL/min   GFR calc Af Amer 58 (L) >60 mL/min    Comment: (NOTE) The eGFR has been calculated using the CKD EPI equation. This calculation has not been validated in all clinical situations. eGFR's persistently <60 mL/min signify possible Chronic Kidney Disease.    Anion gap 6 5 - 15   No results found.  ROS  Blood pressure 112/75, pulse 75, temperature 98.1 F (36.7 C), temperature source Oral, resp. rate 18, height 5' 2" (1.575 m), weight 75.3 kg (166 lb), SpO2 100 %. Physical Exam Physical Examination: General appearance - alert, well appearing, and in no distress Mental status - alert, oriented to person, place, and time Chest - clear to auscultation, no wheezes, rales or rhonchi, symmetric air entry Heart - normal rate, regular rhythm, normal S1, S2, no murmurs, rubs, clicks or gallops Abdomen - soft, nontender, nondistended, no masses or organomegaly Neurological - alert, oriented, normal speech, no focal findings or movement disorder  noted Right knee- effusion; tender lateral >medial; no deformity; leg compartments soft; EHL/FHL/TA/Gastroc intact; sensation and pulses intact  X-ray- Comminuted lateral tibial plateau fracture with depressed  fragment  Displaced approximately 2.5 cm   Assessment/Plan Right tibial plateau fracture- Plan open reduction and internal fixation with bone grafting. Discussed procedure, risks and potential complications with patient who elects to proceed.  Gearlean Alf, MD 02/29/2016, 4:55 PM

## 2016-02-29 NOTE — Transfer of Care (Signed)
Immediate Anesthesia Transfer of Care Note  Patient: Jessica [name]  Procedure(s) Performed: Procedure(s): OPEN REDUCTION INTERNAL FIXATION (ORIF RIGHT  TIBIAL PLATEAU FRACTURE (Right)  Patient Location: PACU  Anesthesia Type:General  Level of Consciousness:  sedated, patient cooperative and responds to stimulation  Airway & Oxygen Therapy:Patient Spontanous Breathing and Patient connected to face mask oxgen  Post-op Assessment:  Report given to PACU RN and Post -op Vital signs reviewed and stable  Post vital signs:  Reviewed and stable  Last Vitals:  Vitals:   02/29/16 1424  BP: 112/75  Pulse: 75  Resp: 18  Temp: 11.2 C    Complications: No apparent anesthesia complications

## 2016-02-29 NOTE — Brief Op Note (Signed)
02/29/2016  8:34 PM  PATIENT:  Jessica [name]  65 y.o. female  PRE-OPERATIVE DIAGNOSIS:  RIGHT TIBIAL PLATEAU  FRACTURE  POST-OPERATIVE DIAGNOSIS:  RIGHT TIBIAL PLATEAU  FRACTURE  PROCEDURE:  Procedure(s): OPEN REDUCTION INTERNAL FIXATION (ORIF RIGHT  TIBIAL PLATEAU FRACTURE (Right)  SURGEON:  Surgeon(s) and Role:    * Gaynelle Arabian, MD - Primary  PHYSICIAN ASSISTANT:   ASSISTANTS: none   ANESTHESIA:   general  EBL:  Total I/O In: -  Out: 325 [Urine:275; Blood:50]  BLOOD ADMINISTERED:none  DRAINS: (Medium) Hemovact drain(s) in the right knee with  Suction Open   LOCAL MEDICATIONS USED:  NONE  COUNTS:  YES  TOURNIQUET:   Total Tourniquet Time Documented: Thigh (Right) - 64 minutes Total: Thigh (Right) - 64 minutes   DICTATION: .Other Dictation: Dictation Number [id number]  PLAN OF CARE: Admit to inpatient   PATIENT DISPOSITION:  PACU - hemodynamically stable.

## 2016-03-01 ENCOUNTER — Encounter (HOSPITAL_COMMUNITY): Payer: Self-pay | Admitting: Orthopedic Surgery

## 2016-03-01 MED ORDER — ASPIRIN EC 325 MG PO TBEC: 325.0000 mg | DELAYED_RELEASE_TABLET | Freq: Two times a day (BID) | ORAL | 0 refills | Status: DC

## 2016-03-01 MED ORDER — OXYCODONE HCL 5 MG PO TABS: 5.0000 mg | ORAL_TABLET | ORAL | 0 refills | Status: DC | PRN

## 2016-03-01 MED ORDER — METHOCARBAMOL 500 MG PO TABS: 500.0000 mg | ORAL_TABLET | Freq: Four times a day (QID) | ORAL | 0 refills | Status: DC | PRN

## 2016-03-01 NOTE — Progress Notes (Signed)
Occupational Therapy Evaluation Patient Details Name: AZRAEL HUSS MRN: [id number] DOB: [birth year] Today's Date: 03/01/2016    History of Present Illness Pt s/p fall with L hand fx and R tibial plateau fx.     Clinical Impression   Patient presents to OT with decreased ADL independence and safety due to the above diagnoses and the deficits listed below. She will benefit from skilled OT to maximize function and to facilitate a safe discharge. OT will follow.    Follow Up Recommendations  SNF    Equipment Recommendations  Other (comment) (tbd next venue of care)    Recommendations for Other Services       Precautions / Restrictions Precautions Precautions: Fall Required Braces or Orthoses: Knee Immobilizer - Right;Other Brace/Splint Knee Immobilizer - Right: On at all times Restrictions Weight Bearing Restrictions: Yes LUE Weight Bearing: Non weight bearing RLE Weight Bearing: Touchdown weight bearing Other Position/Activity Restrictions: presumed NWB through L hand due to hand fx L      Mobility Bed Mobility Overal bed mobility: Needs Assistance;+2 for physical assistance Bed Mobility: Supine to Sit     Supine to sit: Min assist;+2 for physical assistance;+2 for safety/equipment     General bed mobility comments: cues for sequence and use of L LE to self assist; physical assist to manage R LE and to assist trunk to upright  Transfers Overall transfer level: Needs assistance Equipment used: Left platform walker Transfers: Sit to/from Stand Sit to Stand: From elevated surface;Min assist;+2 physical assistance;+2 safety/equipment         General transfer comment: elevated surface and physical assist to bring wt up and fwd    Balance                                            ADL Overall ADL's : Needs assistance/impaired Eating/Feeding: Set up;Sitting   Grooming: Wash/dry hands;Wash/dry face;Oral care;Applying deodorant;Minimal  assistance;Moderate assistance;Bed level   Upper Body Bathing: Minimal assitance;Moderate assistance;Bed level   Lower Body Bathing: Maximal assistance;Total assistance;Bed level   Upper Body Dressing : Minimal assistance;Sitting Upper Body Dressing Details (indicate cue type and reason): don gown as robe Lower Body Dressing: Total assistance;Bed level Lower Body Dressing Details (indicate cue type and reason): don socks     Toileting- Clothing Manipulation and Hygiene: Total assistance Toileting - Clothing Manipulation Details (indicate cue type and reason): still has foley     Functional mobility during ADLs: Minimal assistance;+2 for safety/equipment;+2 for physical assistance;Rolling walker General ADL Comments: Patient needed significant cues to not bear weight through L hand.      Vision     Perception     Praxis      Pertinent Vitals/Pain Pain Assessment: 0-10 Pain Score: 8  Pain Location: R leg Pain Descriptors / Indicators: Aching;Throbbing Pain Intervention(s): Limited activity within patient's tolerance;Monitored during session;Premedicated before session;Ice applied     Hand Dominance     Extremity/Trunk Assessment Upper Extremity Assessment Upper Extremity Assessment: LUE deficits/detail LUE Deficits / Details: L hand splint due to fx; elbow and shoulder AROM strength WFL.   Lower Extremity Assessment Lower Extremity Assessment: Defer to PT evaluation RLE Deficits / Details: Immobilizer in place RLE: Unable to fully assess due to immobilization   Cervical / Trunk Assessment Cervical / Trunk Assessment: Normal   Communication Communication Communication: No difficulties   Cognition Arousal/Alertness: Awake/alert Behavior During Therapy:  WFL for tasks assessed/performed Overall Cognitive Status: Within Functional Limits for tasks assessed                     General Comments       Exercises       Shoulder Instructions      Home  Living Family/patient expects to be discharged to:: Private residence Living Arrangements: Spouse/significant other Available Help at Discharge: Family;Available PRN/intermittently Type of Home: House Home Access: Stairs to enter [employer] of Steps: 3+1 Entrance Stairs-Rails: Can reach both Home Layout: One level     Bathroom Shower/Tub: Occupational psychologist: Handicapped height Bathroom Accessibility: Yes How Accessible: Accessible via walker;Accessible via wheelchair Home Equipment: Bedside commode;Cane - single point;Walker - 2 wheels;Shower seat;Transport chair          Prior Functioning/Environment Level of Independence: Independent             OT Diagnosis: Acute pain   OT Problem List: Decreased strength;Decreased range of motion;Decreased activity tolerance;Impaired balance (sitting and/or standing);Decreased knowledge of use of DME or AE;Decreased knowledge of precautions;Pain;Impaired UE functional use   OT Treatment/Interventions: Self-care/ADL training;DME and/or AE instruction;Therapeutic activities;Patient/family education    OT Goals(Current goals can be found in the care plan section) Acute Rehab OT Goals Patient Stated Goal: Regain IND OT Goal Formulation: With patient Time For Goal Achievement: 03/15/16 Potential to Achieve Goals: Good ADL Goals Pt Will Perform Lower Body Bathing: with min assist;with adaptive equipment;sit to/from stand Pt Will Perform Lower Body Dressing: with min assist;with adaptive equipment;sit to/from stand Pt Will Transfer to Toilet: with supervision;bedside commode Pt Will Perform Toileting - Clothing Manipulation and hygiene: with supervision;sit to/from stand  OT Frequency: Min 2X/week   Barriers to D/C: Decreased caregiver support  husband works       Co-evaluation PT/OT/SLP Co-Evaluation/Treatment: Yes Reason for Co-Treatment: Complexity of the patient's impairments (multi-system  involvement);For patient/therapist safety PT goals addressed during session: Mobility/safety with mobility OT goals addressed during session: ADL's and self-care      End of Session Equipment Utilized During Treatment: Gait belt;Right knee immobilizer;Other (comment) (L platform RW) Nurse Communication: Mobility status;Precautions  Activity Tolerance: Patient tolerated treatment well Patient left: in chair;with call bell/phone within reach;with chair alarm set;with family/visitor present   Time: 9122-5834 OT Time Calculation (min): 42 min Charges:  OT General Charges $OT Visit: 1 Procedure OT Evaluation $OT Eval Moderate Complexity: 1 Procedure G-Codes:    Elisha Cooksey A 03/04/16, 2:18 PM

## 2016-03-01 NOTE — Evaluation (Addendum)
Physical Therapy Evaluation Patient Details Name: Jessica [name] MRN: [id number] DOB: January 31, 1951 Today's Date: 03/01/2016   History of Present Illness  Pt s/p fall with L hand fx and R tibial plateau fx.    Clinical Impression  Pt admitted as above and presenting with functional mobility limitations 2* TDWB R LE, NWB and splint L hand, generalized weakness and post op pain.  Pt would benefit from follow up rehab at SNF level to maximize IND and safety prior to return home with ltd assist (husband works days and returns to work Monday).    Follow Up Recommendations SNF    Equipment Recommendations  Other (comment) (platform for RW if dc home)    Recommendations for Other Services OT consult     Precautions / Restrictions Precautions Precautions: Fall Required Braces or Orthoses: Knee Immobilizer - Right;Other Brace/Splint Knee Immobilizer - Right: On at all times Restrictions Weight Bearing Restrictions: NWB L hand, TDWB R LE      Mobility  Bed Mobility Overal bed mobility: Needs Assistance;+2 for physical assistance Bed Mobility: Supine to Sit     Supine to sit: Min assist;+2 for physical assistance;+2 for safety/equipment     General bed mobility comments: cues for sequence and use of L LE to self assist; physical assist to manage R LE and to assist trunk to upright  Transfers Overall transfer level: Needs assistance Equipment used: Left platform walker Transfers: Sit to/from Stand Sit to Stand: From elevated surface;Min assist;+2 physical assistance;+2 safety/equipment         General transfer comment: elevated surface and physical assist to bring wt up and fwd  Ambulation/Gait Ambulation/Gait assistance: Min assist;+2 physical assistance;+2 safety/equipment Ambulation Distance (Feet): 6 Feet Assistive device: Left platform walker Gait Pattern/deviations: Step-to pattern;Decreased step length - right;Decreased step length - left;Shuffle;Trunk flexed Gait  velocity: decr Gait velocity interpretation: Below normal speed for age/gender General Gait Details: cues for posture, R LE management and sequence.  Pt managing NWB on R LE but distance ltd by weakness L LE and bilat UEs   Stairs            Wheelchair Mobility    Modified Rankin (Stroke Patients Only)       Balance                                             Pertinent Vitals/Pain Pain Assessment: 0-10 Pain Score: 8  Pain Location: R leg Pain Descriptors / Indicators: Aching;Throbbing Pain Intervention(s): Limited activity within patient's tolerance;Monitored during session;Premedicated before session;Ice applied    Home Living Family/patient expects to be discharged to:: Private residence Living Arrangements: Spouse/significant other Available Help at Discharge: Family;Available PRN/intermittently Type of Home: House Home Access: Stairs to enter Entrance Stairs-Rails: Can reach both Entrance Stairs-Number of Steps: 3+1 Home Layout: One level Home Equipment: Bedside commode;Cane - single point;Walker - 2 wheels;Wheelchair - manual;Shower seat      Prior Function Level of Independence: Independent               Hand Dominance        Extremity/Trunk Assessment   Upper Extremity Assessment: Generalized weakness;LUE deficits/detail;Defer to OT evaluation           Lower Extremity Assessment: RLE deficits/detail;Generalized weakness RLE Deficits / Details: Immobilizer in place    Cervical / Trunk Assessment: Normal  Communication   Communication: No difficulties  Cognition Arousal/Alertness: Awake/alert Behavior During Therapy: WFL for tasks assessed/performed Overall Cognitive Status: Within Functional Limits for tasks assessed                      General Comments      Exercises        Assessment/Plan    PT Assessment Patient needs continued PT services  PT Diagnosis Difficulty walking   PT Problem List  Decreased strength;Decreased range of motion;Decreased activity tolerance;Decreased balance;Decreased mobility;Decreased knowledge of use of DME;Pain  PT Treatment Interventions DME instruction;Gait training;Functional mobility training;Therapeutic activities;Therapeutic exercise;Stair training;Patient/family education   PT Goals (Current goals can be found in the Care Plan section) Acute Rehab PT Goals Patient Stated Goal: Regain IND PT Goal Formulation: With patient Time For Goal Achievement: 03/07/16 Potential to Achieve Goals: Fair    Frequency Min 5X/week   Barriers to discharge        Co-evaluation PT/OT/SLP Co-Evaluation/Treatment: Yes Reason for Co-Treatment: Complexity of the patient's impairments (multi-system involvement);For patient/therapist safety PT goals addressed during session: Mobility/safety with mobility         End of Session Equipment Utilized During Treatment: Gait belt;Right knee immobilizer;Other (comment) Activity Tolerance: Patient limited by fatigue;Patient limited by pain Patient left: in chair;with call bell/phone within reach;with family/visitor present Nurse Communication: Mobility status         Time: 1164-3539 PT Time Calculation (min) (ACUTE ONLY): 41 min   Charges:   PT Evaluation $PT Eval Moderate Complexity: 1 Procedure PT Treatments $Gait Training: 8-22 mins   PT G Codes:        Chirstine Defrain 2016/03/02, 12:53 PM

## 2016-03-01 NOTE — Progress Notes (Signed)
Spoke with patient and spouse at bedside. Discussed with CSW who states per attending plan was for home. Patient states she wishes to go to SNF as recommended by PT. Discussed with her the option of home with [facility] services. She currently is limited in her mobility and states she does not have help at home. . Patient will discuss with family and try to arrange for help at home. Will reassess in am to see if improvement in mobility.

## 2016-03-01 NOTE — Discharge Instructions (Addendum)
Touch down weightbearing right leg Shower only, no tub bath. Change dressing daily with sterile gauze and paper tape.  No lotions or creams over the incision. Take aspirin 347m twice daily to prevent blood clots for three weeks and then reduce back to an 81 mg Aspirin daily. Follow up in office with Dr. AWynelle Linkin 2 weeks.

## 2016-03-01 NOTE — Op Note (Signed)
Jessica [name], Jessica [name] NO.:  [id number]  MEDICAL RECORD NO.:  [id number]  LOCATION:  5409                         FACILITY:  [facility]  PHYSICIAN:  Gaynelle Arabian, M.D.    DATE OF BIRTH:  Sep 24, 1950  DATE OF PROCEDURE:  02/29/2016 DATE OF DISCHARGE:                              OPERATIVE REPORT   PREOPERATIVE DIAGNOSIS:  Right tibial plateau fracture.  POSTOPERATIVE DIAGNOSIS:  Right tibial plateau fracture.  PROCEDURE:  Open reduction and internal fixation, right tibial plateau fracture.  SURGEON:  Gaynelle Arabian, M.D.  ASSISTANT:  None.  ANESTHESIA:  General.  ESTIMATED BLOOD LOSS:  50.  DRAINS:  Hemovac x1.  TOURNIQUET TIME:  62 minutes at 300 mmHg.  COMPLICATIONS:  None.  CONDITION:  Stable to recovery.  BRIEF CLINICAL NOTE:  Jessica [name] is a 65 year old female, who had an accident approximately 4 days ago in which she fell on a flexed knee onto a metal toe hitch.  She hit the knee and had immediate pain and inability to bear weight.  She was noted to have a lateral tibial plateau depressed fracture.  She was sent to the [facility] Emergency Room.  A CT showed a severely comminuted fracture.  She was told to call our office the next day, and presented to the office yesterday with this significantly comminuted fracture.  She presents to the hospital today for surgical fixation of this.  PROCEDURE IN DETAIL:  After successful administration of general anesthetic, a tourniquet was placed high on the right thigh and the right lower extremity was prepped and draped in the usual sterile fashion.  Extremity was wrapped in Esmarch, and tourniquet inflated to 300 mmHg.  A hockey-stick type incision was made starting at the joint line coursing horizontally just medial to the tibial tubercle and then coursing distally along the anterior crest of the tibia.  Skin was cut with a 10 blade through subcutaneous tissues and subcu flap was raised. An incision was  then made in the periosteum of the tibia along the tibial crest and also horizontally just below the joint line. Subperiosteal elevation was performed of the anterior compartment to get to the proximal tibia.  It was a split depression type fracture.  I had to open up the split portion of it and then identify the depressed fragments.  There were two very large fragments that were depressed about 2 cm each.  I was able to elevate these back up to the joint line to restore the congruity of the joint line.  I then placed bone graft, corticocancellous allograft with DBL mixed in.  I was very pleased with the elevation of the joint line and then we closed down the wedge portion of this.  Fortunately, the comminution did not cross the midline.  We reduced the fracture and then placed a 5-hole Biomet proximal tibial locking plate.  I placed K-wires in it to provisionally fix the plate to the bone.  Multiple fluoro views were taken.  I was very pleased with the reduction.  We then placed 2 cancellous screws proximally and 2 cortical screws distally to lock the plate into position.  These were appropriate length screws and  I had great purchase.  The wedge portion of this compressed down beautifully with a near anatomic reduction of it.  I then placed approximately 3-4 more screws proximally and placed the 2 kickstand screws obliquely, these were both locking screws.  I placed another screw distally.  I was very pleased with this reduction.  I took multiple views AP, obliques and lateral and this was a very good reduction and the joint line was well restored.  It was not perfect because the amount of comminution but it was restored back.  It was close to native anatomy as possible.  Wound was then copiously irrigated with saline solution.  I closed the periosteum and then let the tourniquet down for a total time of 64 minutes.  A Hemovac drain was placed posterior with some mild oozing. No active  bleeding was noted.  Subcu was then closed with interrupted 2- 0 Vicryl and the skin with staples.  The incision was then cleaned and dried and a bulky sterile dressing applied.  The drains hooked to suction.  The leg was then placed into a knee immobilizer and she was awakened and transported to recovery in stable condition.  Pulses were normal and all compartments soft at the time of closure.     Gaynelle Arabian, M.D.     FA/MEDQ  D:  02/29/2016  T:  03/01/2016  Job:  [id number]

## 2016-03-01 NOTE — Progress Notes (Signed)
   Subjective: 1 Day Post-Op Procedure(s) (LRB): OPEN REDUCTION INTERNAL FIXATION (ORIF RIGHT  TIBIAL PLATEAU FRACTURE (Right) Patient reports pain as moderate.   Patient seen in rounds with Dr. Wynelle Link. Patient is well, and has had no acute complaints or problems other than discomfort in the right leg. No SOB or chest pain. No issues overnight.  Objective: Vital signs in last 24 hours: Temp:  [98 F (36.7 C)-98.7 F (37.1 C)] 98.6 F (37 C) (08/25 0620) Pulse Rate:  [75-112] 86 (08/25 0620) Resp:  [14-18] 16 (08/25 0620) BP: (112-162)/(68-88) 121/69 (08/25 0620) SpO2:  [100 %] 100 % (08/25 0620) Weight:  [75.3 kg (166 lb)-75.4 kg (166 lb 5 oz)] 75.3 kg (166 lb) (08/24 1452)  Intake/Output from previous day:  Intake/Output Summary (Last 24 hours) at 03/01/16 0745 Last data filed at 03/01/16 0620  Gross per 24 hour  Intake             2275 ml  Output             1305 ml  Net              970 ml     Labs:  Recent Labs  02/29/16 1432  HGB 11.2*    Recent Labs  02/29/16 1432  WBC 11.8*  RBC 3.50*  HCT 31.8*  PLT 214    Recent Labs  02/29/16 1432  NA 135  K 3.6  CL 109  CO2 20*  BUN 26*  CREATININE 1.14*  GLUCOSE 90  CALCIUM 9.1    EXAM General - Patient is Alert and Oriented Extremity - Neurologically intact Sensation intact distally Intact pulses distally Dorsiflexion/Plantar flexion intact Compartment soft Dressing/Incision - clean, dry Motor Function - intact, moving foot and toes well on exam.   Past Medical History:  Diagnosis Date  . Anxiety attack   . Back pain, chronic   . Crohn's colitis ([place])   . Depression (emotion)   . GERD (gastroesophageal reflux disease)   . Hypertension   . Hypothyroid   . Neuropathy ([place])   . Sleep apnea    cpap is not working per patient - needs to find a new doctor - not used since 7/16 per pat   . Vertigo    followed by Dr Wardell Heath- in [place]     Assessment/Plan: 1 Day Post-Op Procedure(s) (LRB): OPEN  REDUCTION INTERNAL FIXATION (ORIF RIGHT  TIBIAL PLATEAU FRACTURE (Right) Principal Problem:   Closed fracture of lateral portion of right tibial plateau Active Problems:   Tibial plateau fracture  Estimated body mass index is 30.36 kg/m as calculated from the following:   Height as of this encounter: 5' 2"  (1.575 m).   Weight as of this encounter: 75.3 kg (166 lb). Advance diet Up with therapy D/C IV fluids when tolerating POs well  DVT Prophylaxis - Lovenox Touch down weightbearing right leg  Will have her get up with therapy today. Plan for DC home tomorrow.   Ardeen Jourdain, PA-C Orthopaedic Surgery 03/01/2016, 7:45 AM

## 2016-03-02 MED ORDER — ALUM & MAG HYDROXIDE-SIMETH 200-200-20 MG/5ML PO SUSP
30.0000 mL | Freq: Once | ORAL | Status: AC
  Administered 2016-03-03: 30 mL via ORAL
  Filled 2016-03-02: qty 30

## 2016-03-02 NOTE — NC FL2 (Signed)
[place] LEVEL OF CARE SCREENING TOOL     IDENTIFICATION  Patient Name: Jessica [name] Birthdate: Jul 26, 1950 Sex: female Admission Date (Current Location): 02/29/2016  [facility] and [place] Number:  Engineer, manufacturing systems and Address:  [facility],  [place] [address] [place]      Provider Number: [id number]  Attending Physician Name and Address:  Gaynelle Arabian, MD  Relative Name and Phone Number:       Current Level of Care: Hospital Recommended Level of Care: [place] Prior Approval Number:    Date Approved/Denied:   PASRR Number:    Discharge Plan: SNF    Current Diagnoses: Patient Active Problem List   Diagnosis Date Noted  . Closed fracture of lateral portion of right tibial plateau 02/29/2016  . Tibial plateau fracture 02/29/2016  . Acute medial meniscal tear 03/28/2015  . Crohn's colitis ([place]) 02/10/2013  . Unspecified hypothyroidism 02/10/2013  . Neuropathy ([place]) 02/10/2013    Orientation RESPIRATION BLADDER Height & Weight     Self, Time, Situation, Place  Normal Continent Weight: 166 lb (75.3 kg) Height:  5' 2"  (157.5 cm)  BEHAVIORAL SYMPTOMS/MOOD NEUROLOGICAL BOWEL NUTRITION STATUS  Other (Comment) (no behaviors)   Continent Diet  AMBULATORY STATUS COMMUNICATION OF NEEDS Skin   Limited Assist Verbally Surgical wounds                       Personal Care Assistance Level of Assistance  Bathing, Dressing Bathing Assistance: Limited assistance Feeding assistance: Independent Dressing Assistance: Limited assistance     Functional Limitations Info  Sight, Hearing, Speech Sight Info: Adequate Hearing Info: Adequate Speech Info: Adequate    SPECIAL CARE FACTORS FREQUENCY  PT (By licensed PT), OT (By licensed OT)                    Contractures Contractures Info: Not present    Additional Factors Info  Code Status Code Status Info: Full Code             Current Medications  (03/02/2016):  This is the current hospital active medication list Current Facility-Administered Medications  Medication Dose Route Frequency Provider Last Rate Last Dose  . 0.9 %  sodium chloride infusion   Intravenous Continuous Gaynelle Arabian, MD 100 mL/hr at 03/01/16 0601    . bisacodyl (DULCOLAX) suppository 10 mg  10 mg Rectal Daily PRN Gaynelle Arabian, MD      . diphenoxylate-atropine (LOMOTIL) 2.5-0.025 MG per tablet 1 tablet  1 tablet Oral TID PRN Gaynelle Arabian, MD      . docusate sodium (COLACE) capsule 100 mg  100 mg Oral BID Gaynelle Arabian, MD   100 mg at 03/01/16 2230  . enoxaparin (LOVENOX) injection 40 mg  40 mg Subcutaneous Q24H Gaynelle Arabian, MD   40 mg at 03/01/16 0746  . fluticasone (FLONASE) 50 MCG/ACT nasal spray 2 spray  2 spray Each Nare BID PRN Gaynelle Arabian, MD      . gabapentin (NEURONTIN) capsule 300 mg  300 mg Oral TID PRN Gaynelle Arabian, MD      . HYDROmorphone (DILAUDID) injection 0.5 mg  0.5 mg Intravenous Q2H PRN Gaynelle Arabian, MD   0.5 mg at 03/02/16 0233  . levothyroxine (SYNTHROID, LEVOTHROID) tablet 100 mcg  100 mcg Oral QAC breakfast Gaynelle Arabian, MD   100 mcg at 03/01/16 0746  . loratadine (CLARITIN) tablet 10 mg  10 mg Oral Daily Gaynelle Arabian, MD  10 mg at 03/01/16 0916  . methocarbamol (ROBAXIN) tablet 500 mg  500 mg Oral Q6H PRN Gaynelle Arabian, MD   500 mg at 03/01/16 0434   Or  . methocarbamol (ROBAXIN) 500 mg in dextrose 5 % 50 mL IVPB  500 mg Intravenous Q6H PRN Gaynelle Arabian, MD   500 mg at 02/29/16 2114  . metoCLOPramide (REGLAN) tablet 5-10 mg  5-10 mg Oral Q8H PRN Gaynelle Arabian, MD       Or  . metoCLOPramide (REGLAN) injection 5-10 mg  5-10 mg Intravenous Q8H PRN Gaynelle Arabian, MD      . ondansetron [facility]) tablet 4 mg  4 mg Oral Q6H PRN Gaynelle Arabian, MD       Or  . ondansetron [facility]) injection 4 mg  4 mg Intravenous Q6H PRN Gaynelle Arabian, MD      . oxyCODONE (Oxy IR/ROXICODONE) immediate release tablet 5-10 mg  5-10 mg Oral Q3H PRN Gaynelle Arabian,  MD   10 mg at 03/02/16 0515  . pantoprazole (PROTONIX) EC tablet 40 mg  40 mg Oral BID AC Gaynelle Arabian, MD   40 mg at 03/01/16 1752  . polyethylene glycol (MIRALAX / GLYCOLAX) packet 17 g  17 g Oral Daily PRN Gaynelle Arabian, MD      . promethazine (PHENERGAN) tablet 25 mg  25 mg Oral Daily PRN Gaynelle Arabian, MD      . sertraline (ZOLOFT) tablet 100 mg  100 mg Oral QHS Gaynelle Arabian, MD   100 mg at 03/01/16 2230  . sodium phosphate (FLEET) 7-19 GM/118ML enema 1 enema  1 enema Rectal Once PRN Gaynelle Arabian, MD      . triamterene-hydrochlorothiazide (MAXZIDE-25) 37.5-25 MG per tablet 1 tablet  1 tablet Oral Daily Gaynelle Arabian, MD         Discharge Medications: Please see discharge summary for a list of discharge medications.  Relevant Imaging Results:  Relevant Lab Results:   Additional Information SS # [id number]  [place], Randall An, LCSW

## 2016-03-02 NOTE — Progress Notes (Signed)
Physical Therapy Treatment Patient Details Name: Jessica [name] MRN: [id number] DOB: 1950-08-18 Today's Date: 03/02/2016    History of Present Illness Pt s/p fall with L hand fx and R tibial plateau fx.      PT Comments    Pt improving with mobility but continues ltd by NWB R hand, TDWB R LE and generalized weakness 2* premorbid deconditioning.  Pt would benefit from follow up rehab at SNF level to maximize IND and safety prior to return home with ltd assist.  Follow Up Recommendations  SNF     Equipment Recommendations  Other (comment)    Recommendations for Other Services OT consult     Precautions / Restrictions Precautions Precautions: Fall Required Braces or Orthoses: Knee Immobilizer - Right;Other Brace/Splint Knee Immobilizer - Right: On at all times Restrictions Weight Bearing Restrictions: Yes LUE Weight Bearing: Non weight bearing RLE Weight Bearing: Non weight bearing Other Position/Activity Restrictions: presumed NWB through L hand due to hand fx L    Mobility  Bed Mobility Overal bed mobility: Needs Assistance Bed Mobility: Supine to Sit     Supine to sit: Min assist;Mod assist     General bed mobility comments: cues for sequence and use of L LE to self assist; physical assist to manage R LE and complete transition to EOB.  Pt repeatedly attempting to utilize L hand to self assist  Transfers Overall transfer level: Needs assistance Equipment used: Left platform walker Transfers: Sit to/from Stand;Stand Pivot Transfers Sit to Stand: From elevated surface;Min assist;+2 physical assistance;+2 safety/equipment Stand pivot transfers: Min assist;Mod assist;+2 safety/equipment;From elevated surface       General transfer comment: elevated surface and physical assist to bring wt up and fwd.  Pt unable to attempt stand pvt sans PFRW 2* anxiety re L LE strength to complete.  Stand/pvt performed with PFRW, min/mod assist and cues for posture, position from RW  and saftey awareness  Ambulation/Gait Ambulation/Gait assistance: Min assist;Mod assist;+2 safety/equipment (chair follow ) Ambulation Distance (Feet): 12 Feet Assistive device: Left platform walker Gait Pattern/deviations: Step-to pattern;Decreased step length - left;Shuffle;Trunk flexed Gait velocity: decr Gait velocity interpretation: Below normal speed for age/gender General Gait Details: cues for posture, R LE management and sequence.  Pt managing NWB on R LE but distance ltd by weakness L LE and bilat UEs    Stairs            Wheelchair Mobility    Modified Rankin (Stroke Patients Only)       Balance                                    Cognition Arousal/Alertness: Awake/alert Behavior During Therapy: WFL for tasks assessed/performed Overall Cognitive Status: Within Functional Limits for tasks assessed                      Exercises      General Comments        Pertinent Vitals/Pain Pain Assessment: 0-10 Pain Score: 8  Pain Location: R leg Pain Descriptors / Indicators: Aching;Sore Pain Intervention(s): Limited activity within patient's tolerance;Monitored during session;Premedicated before session;Ice applied    Home Living                      Prior Function            PT Goals (current goals can now be found in the care  plan section) Acute Rehab PT Goals Patient Stated Goal: Regain IND PT Goal Formulation: With patient Time For Goal Achievement: 03/07/16 Potential to Achieve Goals: Fair Progress towards PT goals: Progressing toward goals    Frequency  Min 5X/week    PT Plan Current plan remains appropriate    Co-evaluation             End of Session Equipment Utilized During Treatment: Gait belt;Right knee immobilizer;Other (comment) Activity Tolerance: Patient limited by fatigue;Patient limited by pain Patient left: in chair;with call bell/phone within reach;with family/visitor present     Time:  1112-1140 PT Time Calculation (min) (ACUTE ONLY): 28 min  Charges:  $Gait Training: 8-22 mins $Therapeutic Activity: 8-22 mins                    G Codes:      Prudie Guthridge 03-17-16, 12:30 PM

## 2016-03-02 NOTE — Progress Notes (Signed)
Subjective: 2 Days Post-Op Procedure(s) (LRB): OPEN REDUCTION INTERNAL FIXATION (ORIF RIGHT  TIBIAL PLATEAU FRACTURE (Right) Patient reports pain as 3 on 0-10 scale.    Objective: Vital signs in last 24 hours: Temp:  [98.1 F (36.7 C)-98.6 F (37 C)] 98.1 F (36.7 C) (08/26 0630) Pulse Rate:  [81-88] 81 (08/26 0630) Resp:  [12-16] 12 (08/26 0630) BP: (107-142)/(53-76) 122/71 (08/26 0630) SpO2:  [96 %-100 %] 98 % (08/26 0630)  Intake/Output from previous day: 08/25 0701 - 08/26 0700 In: 1370 [P.O.:240; I.V.:1130] Out: 1900 [Urine:1900] Intake/Output this shift: No intake/output data recorded.   Recent Labs  02/29/16 1432  HGB 11.2*    Recent Labs  02/29/16 1432  WBC 11.8*  RBC 3.50*  HCT 31.8*  PLT 214    Recent Labs  02/29/16 1432  NA 135  K 3.6  CL 109  CO2 20*  BUN 26*  CREATININE 1.14*  GLUCOSE 90  CALCIUM 9.1   No results for input(s): LABPT, INR in the last 72 hours.  Neurologically intact Neurovascular intact Dorsiflexion/Plantar flexion intact Incision: no drainage no sign of infection. Dsg changed.  Assessment/Plan: 2 Days Post-Op Procedure(s) (LRB): OPEN REDUCTION INTERNAL FIXATION (ORIF RIGHT  TIBIAL PLATEAU FRACTURE (Right) Advance diet Up with therapy Discharge to SNF  Needs SNF due to multiple injuries and no help at home.  Tammy Ericsson C 03/02/2016, 8:02 AM

## 2016-03-02 NOTE — Clinical Social Work Placement (Addendum)
   CLINICAL SOCIAL WORK PLACEMENT  NOTE  Date:  03/02/2016  Patient Details  Name: Jessica [name] MRN: [id number] Date of Birth: 02-Sep-1950  Clinical Social Work is seeking post-discharge placement for this patient at the [place] level of care (*CSW will initial, date and re-position this form in  chart as items are completed):  Yes   Patient/family provided with [facility] Work Department's list of facilities offering this level of care within the geographic area requested by the patient (or if unable, by the patient's family).  Yes   Patient/family informed of their freedom to choose among providers that offer the needed level of care, that participate in Medicare, Medicaid or managed care program needed by the patient, have an available bed and are willing to accept the patient.  Yes   Patient/family informed of [facility]'s ownership interest in [facility] and [facility], as well as of the fact that they are under no obligation to receive care at these facilities.  PASRR submitted to EDS on 03/02/16     PASRR number received on       Existing PASRR number confirmed on       FL2 transmitted to all facilities in geographic area requested by pt/family on       FL2 transmitted to all facilities within larger geographic area on       Patient informed that his/her managed care company has contracts with or will negotiate with certain facilities, including the following:            Patient/family informed of bed offers received.  Patient chooses bed at       Physician recommends and patient chooses bed at      Patient to be transferred to   on  .  Patient to be transferred to facility by       Patient family notified on   of transfer.  Name of family member notified:        PHYSICIAN       Additional Comment:    _______________________________________________ Luretha Rued, [place]

## 2016-03-02 NOTE — Clinical Social Work Note (Addendum)
Clinical Social Work Assessment  Patient Details  Name: Jessica [name] MRN: [id number] Date of Birth: March 29, 1951  Date of referral:  03/02/16               Reason for consult:  Facility Placement, Discharge Planning                Permission sought to share information with:  Chartered certified accountant granted to share information::  Yes, Verbal Permission Granted  Name::        Agency::     Relationship::     Contact Information:     Housing/Transportation Living arrangements for the past 2 months:  Single Family Home Source of Information:  Patient, Spouse Patient Interpreter Needed:  None Criminal Activity/Legal Involvement Pertinent to Current Situation/Hospitalization:  No - Comment as needed Significant Relationships:  Spouse Lives with:  Spouse Do you feel safe going back to the place where you live?  No Need for family participation in patient care:  Yes (Comment)  Care giving concerns: Pt's care cannot be managed at home following hospital d/c.   Social Worker assessment / plan:  Pt hospitalized on 02/29/16 with a right tibial fx which required surgery. PT has recommended SNF at d/c. CSW spoke with PA on 8/25 to review PT recommendations and d/c planning. PA spoke with Dr. Wynelle Link and reported that MD would like pt to return home and would keep pt an additional day in hospital. CSW met with DR. Beane this am. MD feels that pt is not progressing and SNF is needed. CSW met with pt / spouse to assist with d/c planning. Pt / spouse are in agreement with [facility] placement and SNF search has been initiated. Bed offers are pending. PASRR submitted and requires manual review. PASRR is closed on weekends. Pt cannot be d/c to SNF until PASRR is received. CSW will continue following to assist with d/c planning to SNF.   Employment status:  Retired Nurse, adult PT Recommendations:  [place] / Referral to community  resources:     Patient/Family's Response to care: Pt / spouse feel rehab is needed.  Patient/Family's Understanding of and Emotional Response to Diagnosis, Current Treatment, and Prognosis:  Pt / spouse are aware of pt's medical status. Pt / spouse report pt has limited support at home and rehab is needed. Pt is anxious regarding d/c planning. Support / reassurance provided.  Emotional Assessment Appearance:  Appears stated age Attitude/Demeanor/Rapport:  Other (cooperative) Affect (typically observed):  Anxious Orientation:  Oriented to Self, Oriented to Place, Oriented to  Time, Oriented to Situation Alcohol / Substance use:  Not Applicable Psych involvement (Current and /or in the community):  No (Comment)  Discharge Needs  Concerns to be addressed:  Discharge Planning Concerns Readmission within the last 30 days:  No Current discharge risk:  None Barriers to Discharge:  [place] ([place])   Luretha Rued, [employer] 03/02/2016, 9:13 AM

## 2016-03-02 NOTE — Progress Notes (Signed)
Physical Therapy Treatment Patient Details Name: Jessica [name] MRN: [id number] DOB: 10-05-1950 Today's Date: 03/02/2016    History of Present Illness Pt s/p fall with L hand fx and R tibial plateau fx.      PT Comments    Pt progressing slowly 2* pain, NWB L hand, TDWB R LE and premorbid deconditioning.  Follow Up Recommendations  SNF     Equipment Recommendations  Other (comment)    Recommendations for Other Services OT consult     Precautions / Restrictions Precautions Precautions: Fall Required Braces or Orthoses: Knee Immobilizer - Right;Other Brace/Splint Knee Immobilizer - Right: On at all times Restrictions Weight Bearing Restrictions: Yes LUE Weight Bearing: Non weight bearing (NWB through hand) RLE Weight Bearing: Touchdown weight bearing Other Position/Activity Restrictions: presumed NWB through L hand due to hand fx L    Mobility  Bed Mobility Overal bed mobility: Needs Assistance Bed Mobility: Sit to Supine       Sit to supine: Min assist;Mod assist   General bed mobility comments: cues for sequence and use of L LE to self assist; physical assist to manage  LEs  Transfers Overall transfer level: Needs assistance Equipment used: Left platform walker Transfers: Sit to/from Stand Sit to Stand: From elevated surface;Min assist Stand pivot transfers: Min assist;+2 safety/equipment;From elevated surface       General transfer comment: elevated surface and physical assist to bring wt up and fwd and to balance initial standing.  Stand/pvt performed with PFRW, min/mod assist and cues for posture, position from RW and saftey awareness  Ambulation/Gait Ambulation/Gait assistance: Min assist;+2 physical assistance;+2 safety/equipment Ambulation Distance (Feet): 3 Feet Assistive device: Left platform walker Gait Pattern/deviations: Shuffle;Trunk flexed Gait velocity: decr Gait velocity interpretation: Below normal speed for age/gender General Gait  Details: cues for posture, R LE management and sequence.  Pt managing NWB on R LE but unable to clear L foot from floor with attempts to step back to bed.   Stairs            Wheelchair Mobility    Modified Rankin (Stroke Patients Only)       Balance                                    Cognition Arousal/Alertness: Awake/alert Behavior During Therapy: WFL for tasks assessed/performed Overall Cognitive Status: Within Functional Limits for tasks assessed                      Exercises General Exercises - Lower Extremity Ankle Circles/Pumps: AROM;Both;15 reps;Supine    General Comments        Pertinent Vitals/Pain Pain Assessment: 0-10 Pain Score: 9  Pain Location: R leg Pain Descriptors / Indicators: Aching;Sore;Grimacing Pain Intervention(s): Limited activity within patient's tolerance;Monitored during session;Premedicated before session;Ice applied    Home Living                      Prior Function            PT Goals (current goals can now be found in the care plan section) Acute Rehab PT Goals Patient Stated Goal: Regain IND PT Goal Formulation: With patient Time For Goal Achievement: 03/07/16 Potential to Achieve Goals: Fair Progress towards PT goals: Progressing toward goals    Frequency  Min 5X/week    PT Plan Current plan remains appropriate    Co-evaluation  End of Session Equipment Utilized During Treatment: Gait belt;Right knee immobilizer;Other (comment) Activity Tolerance: Patient limited by fatigue;Patient limited by pain Patient left: in bed;with call bell/phone within reach;with bed alarm set     Time: 1421-1447 PT Time Calculation (min) (ACUTE ONLY): 26 min  Charges:  $Therapeutic Activity: 8-22 mins                    G Codes:      Daiel Strohecker 03-31-2016, 4:05 PM

## 2016-03-02 NOTE — Progress Notes (Signed)
CSW assisting with d/c planning. Pt has chosen [facility] for [employer]. SNF will have availability through the week end and Monday. CSW continues to wait for PASRR #. Pt has been updated.  Werner Lean LCSW [phone]

## 2016-03-03 NOTE — Progress Notes (Signed)
Physical Therapy Treatment Patient Details Name: Jessica [name] MRN: [id number] DOB: 17-May-1951 Today's Date: 03/03/2016    History of Present Illness Pt s/p fall with L hand fx and R tibial plateau fx.      PT Comments    Will benefit from SNF post acute; progressing slowly, at risk for falls and is alone at home for >12hrs at a time  Follow Up Recommendations  SNF     Equipment Recommendations  Other (comment) (PFRW if d/c home)    Recommendations for Other Services       Precautions / Restrictions Precautions Precautions: Fall Required Braces or Orthoses: Knee Immobilizer - Right;Other Brace/Splint Knee Immobilizer - Right: On at all times Restrictions LUE Weight Bearing: Non weight bearing RLE Weight Bearing: Touchdown weight bearing Other Position/Activity Restrictions: presumed NWB through L hand due to hand fx L    Mobility  Bed Mobility Overal bed mobility: Needs Assistance Bed Mobility: Supine to Sit     Supine to sit: Min assist     General bed mobility comments: cues for sequence and use of L LE to self assist; physical assist to manage  LEs  Transfers Overall transfer level: Needs assistance Equipment used: Left platform walker Transfers: Sit to/from Stand;Stand Pivot Transfers Sit to Stand: From elevated surface;Min assist Stand pivot transfers: Min assist;+2 safety/equipment;From elevated surface       General transfer comment: cues for hand placement, precautions/WB limitations UE/LE, overall safety  Ambulation/Gait Ambulation/Gait assistance: Min assist Ambulation Distance (Feet): 5 Feet Assistive device: Left platform walker Gait Pattern/deviations: Trunk flexed     General Gait Details: cues for posture, R LE management and sequence.  Pt managing TDWB on RLE, improved ability to clear floor with LLE during steps   Stairs            Wheelchair Mobility    Modified Rankin (Stroke Patients Only)       Balance            Standing balance support: Bilateral upper extremity supported Standing balance-Leahy Scale: Poor Standing balance comment: heavy reliance on UEs, unable to perform her own hygiene after toileting without LOB                    Cognition Arousal/Alertness: Awake/alert Behavior During Therapy: WFL for tasks assessed/performed Overall Cognitive Status: Within Functional Limits for tasks assessed                      Exercises      General Comments        Pertinent Vitals/Pain Pain Assessment: 0-10 Pain Score: 5  Pain Location: R LE Pain Descriptors / Indicators: Grimacing;Sore Pain Intervention(s): Limited activity within patient's tolerance;Monitored during session;Premedicated before session;Repositioned;Ice applied    Home Living                      Prior Function            PT Goals (current goals can now be found in the care plan section) Acute Rehab PT Goals Patient Stated Goal: Regain IND PT Goal Formulation: With patient Time For Goal Achievement: 03/07/16 Potential to Achieve Goals: Fair Progress towards PT goals: Progressing toward goals    Frequency  Min 5X/week    PT Plan Current plan remains appropriate    Co-evaluation             End of Session Equipment Utilized During Treatment: Gait belt;Right knee immobilizer;Other (  comment) Activity Tolerance: Patient limited by fatigue Patient left: in chair;with call bell/phone within reach;with chair alarm set;with family/visitor present     Time: 1045-1115 (minus 4 min to leave pt alone for toileting (waited)) PT Time Calculation (min) (ACUTE ONLY): 30 min  Charges:  $Therapeutic Activity: 23-37 mins                    G Codes:      [name] 03-16-16, 12:02 PM

## 2016-03-03 NOTE — Clinical Social Work Note (Signed)
[place] number not received today. RN aware.   Benay Pike, LCSW

## 2016-03-03 NOTE — Progress Notes (Signed)
Subjective: 3 Days Post-Op Procedure(s) (LRB): OPEN REDUCTION INTERNAL FIXATION (ORIF RIGHT  TIBIAL PLATEAU FRACTURE (Right) Patient reports pain as 3 on 0-10 scale.    Objective: Vital signs in last 24 hours: Temp:  [98.6 F (37 C)-99.1 F (37.3 C)] 98.9 F (37.2 C) (08/27 0635) Pulse Rate:  [83-90] 83 (08/27 0635) Resp:  [14-18] 16 (08/27 0635) BP: (120-140)/(66-73) 120/66 (08/27 0635) SpO2:  [95 %-100 %] 95 % (08/27 0635)  Intake/Output from previous day: 08/26 0701 - 08/27 0700 In: 1080 [P.O.:1080] Out: 3125 [Urine:3125] Intake/Output this shift: No intake/output data recorded.   Recent Labs  02/29/16 1432  HGB 11.2*    Recent Labs  02/29/16 1432  WBC 11.8*  RBC 3.50*  HCT 31.8*  PLT 214    Recent Labs  02/29/16 1432  NA 135  K 3.6  CL 109  CO2 20*  BUN 26*  CREATININE 1.14*  GLUCOSE 90  CALCIUM 9.1   No results for input(s): LABPT, INR in the last 72 hours.  Neurologically intact Neurovascular intact Dorsiflexion/Plantar flexion intact Incision: no drainage no sign of infection. Drsg changed. Compartments soft, no pain with passive stretch  Assessment/Plan: 3 Days Post-Op Procedure(s) (LRB): OPEN REDUCTION INTERNAL FIXATION (ORIF RIGHT  TIBIAL PLATEAU FRACTURE (Right) Advance diet Up with therapy Discharge to SNF  SNF placement pending.  Markesha Hannig, Horald Pollen 03/03/2016, 7:53 AM

## 2016-03-04 ENCOUNTER — Other Ambulatory Visit: Payer: Self-pay | Admitting: Specialist

## 2016-03-04 DIAGNOSIS — R270 Ataxia, unspecified: Secondary | ICD-10-CM

## 2016-03-04 DIAGNOSIS — F339 Major depressive disorder, recurrent, unspecified: Secondary | ICD-10-CM | POA: Insufficient documentation

## 2016-03-04 DIAGNOSIS — R42 Dizziness and giddiness: Secondary | ICD-10-CM | POA: Insufficient documentation

## 2016-03-04 DIAGNOSIS — K51319 Ulcerative (chronic) rectosigmoiditis with unspecified complications: Secondary | ICD-10-CM | POA: Insufficient documentation

## 2016-03-04 DIAGNOSIS — G4731 Primary central sleep apnea: Secondary | ICD-10-CM | POA: Insufficient documentation

## 2016-03-04 DIAGNOSIS — G629 Polyneuropathy, unspecified: Secondary | ICD-10-CM | POA: Insufficient documentation

## 2016-03-04 DIAGNOSIS — R519 Headache, unspecified: Secondary | ICD-10-CM

## 2016-03-04 DIAGNOSIS — F32A Depression, unspecified: Secondary | ICD-10-CM | POA: Insufficient documentation

## 2016-03-04 DIAGNOSIS — R51 Headache: Principal | ICD-10-CM

## 2016-03-04 DIAGNOSIS — M545 Low back pain, unspecified: Secondary | ICD-10-CM | POA: Insufficient documentation

## 2016-03-04 DIAGNOSIS — Z008 Encounter for other general examination: Secondary | ICD-10-CM | POA: Insufficient documentation

## 2016-03-04 DIAGNOSIS — F419 Anxiety disorder, unspecified: Secondary | ICD-10-CM | POA: Insufficient documentation

## 2016-03-04 DIAGNOSIS — I10 Essential (primary) hypertension: Secondary | ICD-10-CM | POA: Insufficient documentation

## 2016-03-04 MED ORDER — METHOCARBAMOL 500 MG PO TABS: 500.0000 mg | ORAL_TABLET | Freq: Four times a day (QID) | ORAL | 0 refills | Status: DC | PRN

## 2016-03-04 MED ORDER — POLYETHYLENE GLYCOL 3350 17 G PO PACK: 17.0000 g | PACK | Freq: Every day | ORAL | 0 refills | Status: DC | PRN

## 2016-03-04 MED ORDER — OXYCODONE HCL 5 MG PO TABS: 5.0000 mg | ORAL_TABLET | ORAL | 0 refills | Status: DC | PRN

## 2016-03-04 MED ORDER — ASPIRIN EC 325 MG PO TBEC: 325.0000 mg | DELAYED_RELEASE_TABLET | Freq: Two times a day (BID) | ORAL | 0 refills | Status: DC

## 2016-03-04 MED ORDER — ONDANSETRON HCL 4 MG PO TABS: 4.0000 mg | ORAL_TABLET | Freq: Four times a day (QID) | ORAL | 0 refills | Status: DC | PRN

## 2016-03-04 MED ORDER — BISACODYL 10 MG RE SUPP: 10.0000 mg | Freq: Every day | RECTAL | 0 refills | Status: DC | PRN

## 2016-03-04 MED ORDER — FLEET ENEMA 7-19 GM/118ML RE ENEM: 1.0000 | ENEMA | Freq: Once | RECTAL | 0 refills | Status: DC | PRN

## 2016-03-04 MED ORDER — DOCUSATE SODIUM 100 MG PO CAPS: 100.0000 mg | ORAL_CAPSULE | Freq: Two times a day (BID) | ORAL | 0 refills | Status: DC

## 2016-03-04 MED ORDER — METOCLOPRAMIDE HCL 5 MG PO TABS: 5.0000 mg | ORAL_TABLET | Freq: Three times a day (TID) | ORAL | 0 refills | Status: DC | PRN

## 2016-03-04 NOTE — Progress Notes (Signed)
   Subjective: 4 Days Post-Op Procedure(s) (LRB): OPEN REDUCTION INTERNAL FIXATION (ORIF RIGHT  TIBIAL PLATEAU FRACTURE (Right) Patient reports pain as mild.   Patient seen in rounds by Dr. Wynelle Link. Patient is well, but has had some minor complaints of pain in the knee, requiring pain medications Patient is ready to go to the SNF  Objective: Vital signs in last 24 hours: Temp:  [97.8 F (36.6 C)-99.6 F (37.6 C)] 98.7 F (37.1 C) (08/28 0526) Pulse Rate:  [80-94] 80 (08/28 0526) Resp:  [17-18] 18 (08/28 0526) BP: (105-145)/(55-72) 128/68 (08/28 0526) SpO2:  [97 %-100 %] 98 % (08/28 0526)  Intake/Output from previous day:  Intake/Output Summary (Last 24 hours) at 03/04/16 0720 Last data filed at 03/04/16 0527  Gross per 24 hour  Intake             1560 ml  Output             2825 ml  Net            -1265 ml    Intake/Output this shift: No intake/output data recorded.  Labs: No results for input(s): HGB in the last 72 hours. No results for input(s): WBC, RBC, HCT, PLT in the last 72 hours. No results for input(s): NA, K, CL, CO2, BUN, CREATININE, GLUCOSE, CALCIUM in the last 72 hours. No results for input(s): LABPT, INR in the last 72 hours.  EXAM: General - Patient is Alert and Appropriate Extremity - Neurovascular intact Sensation intact distally Incision - clean, dry Motor Function - intact, moving foot and toes well on exam.   Assessment/Plan: 4 Days Post-Op Procedure(s) (LRB): OPEN REDUCTION INTERNAL FIXATION (ORIF RIGHT  TIBIAL PLATEAU FRACTURE (Right) Procedure(s) (LRB): OPEN REDUCTION INTERNAL FIXATION (ORIF RIGHT  TIBIAL PLATEAU FRACTURE (Right) Past Medical History:  Diagnosis Date  . Anxiety attack   . Back pain, chronic   . Crohn's colitis ([place])   . Depression (emotion)   . GERD (gastroesophageal reflux disease)   . Hypertension   . Hypothyroid   . Neuropathy ([place])   . Sleep apnea    cpap is not working per patient - needs to find a new doctor -  not used since 7/16 per pat   . Vertigo    followed by Dr Wardell Heath- in [place]    Principal Problem:   Closed fracture of lateral portion of right tibial plateau Active Problems:   Tibial plateau fracture  Estimated body mass index is 30.36 kg/m as calculated from the following:   Height as of this encounter: 5' 2"  (1.575 m).   Weight as of this encounter: 75.3 kg (166 lb). Discharge to SNF Diet - Cardiac diet Follow up - in 2 weeks Activity - WBAT Disposition - Skilled nursing facility Condition Upon Discharge - Stable D/C Meds - See DC Summary DVT Prophylaxis - Lovenox and then resume aspirin   [name], PA-C Orthopaedic Surgery 03/04/2016, 7:20 AM

## 2016-03-04 NOTE — Discharge Summary (Signed)
Physician Discharge Summary   Patient ID: Jessica [name] MRN: [id number] DOB/AGE: 1950/08/17 65 y.o.  Admit date: 02/29/2016 Discharge date: 03/04/2016  Primary Diagnosis:  Right tibial plateau fracture.  Admission Diagnoses:  Past Medical History:  Diagnosis Date  . Anxiety attack   . Back pain, chronic   . Crohn's colitis ([place])   . Depression (emotion)   . GERD (gastroesophageal reflux disease)   . Hypertension   . Hypothyroid   . Neuropathy ([place])   . Sleep apnea    cpap is not working per patient - needs to find a new doctor - not used since 7/16 per pat   . Vertigo    followed by Dr Wardell Heath- in [place]    Discharge Diagnoses:   Principal Problem:   Closed fracture of lateral portion of right tibial plateau Active Problems:   Tibial plateau fracture  Estimated body mass index is 30.36 kg/m as calculated from the following:   Height as of this encounter: 5' 2"  (1.575 m).   Weight as of this encounter: 75.3 kg (166 lb).  Procedure(s) (LRB): OPEN REDUCTION INTERNAL FIXATION (ORIF RIGHT  TIBIAL PLATEAU FRACTURE (Right)   Consults: None  HPI: Jessica [name] is a 65 year old female, who had an accident approximately 4 days ago in which she fell on a flexed knee onto a metal toe hitch.  She hit the knee and had immediate pain and inability to bear weight.  She was noted to have a lateral tibial plateau depressed fracture.  She was sent to the [facility] Emergency Room.  A CT showed a severely comminuted fracture.  She was told to call our office the next day, and presented to the office yesterday with this significantly comminuted fracture.  She presents to the hospital today for surgical fixation of this.  Laboratory Data: Admission on 02/29/2016  Component Date Value Ref Range Status  . ABO/RH(D) 02/29/2016 O POS   Final  . Antibody Screen 02/29/2016 NEG   Final  . Sample Expiration 02/29/2016 03/03/2016   Final  . WBC 02/29/2016 11.8* 4.0 - 10.5 K/uL Final  . RBC  02/29/2016 3.50* 3.87 - 5.11 MIL/uL Final  . Hemoglobin 02/29/2016 11.2* 12.0 - 15.0 g/dL Final  . HCT 02/29/2016 31.8* 36.0 - 46.0 % Final  . MCV 02/29/2016 90.9  78.0 - 100.0 fL Final  . MCH 02/29/2016 32.0  26.0 - 34.0 pg Final  . MCHC 02/29/2016 35.2  30.0 - 36.0 g/dL Final  . RDW 02/29/2016 12.8  11.5 - 15.5 % Final  . Platelets 02/29/2016 214  150 - 400 K/uL Final  . Sodium 02/29/2016 135  135 - 145 mmol/L Final  . Potassium 02/29/2016 3.6  3.5 - 5.1 mmol/L Final  . Chloride 02/29/2016 109  101 - 111 mmol/L Final  . CO2 02/29/2016 20* 22 - 32 mmol/L Final  . Glucose, Bld 02/29/2016 90  65 - 99 mg/dL Final  . BUN 02/29/2016 26* 6 - 20 mg/dL Final  . Creatinine, Ser 02/29/2016 1.14* 0.44 - 1.00 mg/dL Final  . Calcium 02/29/2016 9.1  8.9 - 10.3 mg/dL Final  . GFR calc non Af Amer 02/29/2016 50* >60 mL/min Final  . GFR calc Af Amer 02/29/2016 58* >60 mL/min Final   Comment: (NOTE) The eGFR has been calculated using the CKD EPI equation. This calculation has not been validated in all clinical situations. eGFR's persistently <60 mL/min signify possible Chronic Kidney Disease.   . Anion gap 02/29/2016 6  5 - 15  Final     X-Rays:Dg Knee 1-2 Views Right  Result Date: 02/29/2016 CLINICAL DATA:  ORIF of a right tibial plateau fracture. EXAM: DG C-ARM 1-60 MIN-NO REPORT; RIGHT KNEE - 1-2 VIEW COMPARISON:  02/26/2016 FLUOROSCOPY TIME:  Radiation Exposure Index (as provided by the fluoroscopic device): 2.38 mGy If the device does not provide the exposure index: Fluoroscopy Time:  0 minutes and 48 seconds Number of Acquired Images:  5 FINDINGS: A lateral fixation plate and associated screws reduce the comminuted depressed lateral tibial plateau fracture. Fracture components have been reduced into significantly improved alignment. The depression of the articular surface of the lateral tibial plateau appears reduced. The orthopedic hardware is well-seated. No evidence of an operative complication.  IMPRESSION: 1. ORIF of a comminuted depressed right lateral tibial plateau fracture. Fracture components have been well reduced. Orthopedic hardware is well positioned. Electronically Signed   By: Lajean Manes M.D.   On: 02/29/2016 20:17   Ct Knee Right Wo Contrast  Result Date: 02/26/2016 CLINICAL DATA:  Tibial plateau fracture. EXAM: CT OF THE RIGHT KNEE WITHOUT CONTRAST TECHNIQUE: Multidetector CT imaging of the RIGHT knee was performed according to the standard protocol. Multiplanar CT image reconstructions were also generated. COMPARISON:  Radiographs earlier this day FINDINGS: Bones/Joint/Cartilage Impaction fracture of the lateral tibial plateau. Large central articular surface depression of 17 mm. Gap at the articular surface measures 26 x 21 mm in transverse by AP dimension. Fracture extends to the lateral aspect of the tibial spine. There is a small vertically-oriented anterior component. No involvement of the medial tibial plateau. Proximal fibula, distal femur and patella are intact. Moderate to large lipohemarthrosis. Ligaments Tibial attachment of anterior cruciate ligament abuts the medial most fracture plane, however fibers are grossly intact. Posterior cruciate ligament is intact. Suboptimally assessed by CT. Muscles and Tendons Quadriceps and patellar tendons are intact. No intramuscular hematoma. Soft tissues Soft tissue edema anteriorly. IMPRESSION: Comminuted impaction fracture of the lateral tibial plateau with large central depression and articular surface involvement. Associated lipohemarthrosis. Electronically Signed   By: Jeb Levering M.D.   On: 02/26/2016 22:04   Dg C-arm 1-60 Min-no Report  Result Date: 02/29/2016 CLINICAL DATA:  ORIF of a right tibial plateau fracture. EXAM: DG C-ARM 1-60 MIN-NO REPORT; RIGHT KNEE - 1-2 VIEW COMPARISON:  02/26/2016 FLUOROSCOPY TIME:  Radiation Exposure Index (as provided by the fluoroscopic device): 2.38 mGy If the device does not provide  the exposure index: Fluoroscopy Time:  0 minutes and 48 seconds Number of Acquired Images:  5 FINDINGS: A lateral fixation plate and associated screws reduce the comminuted depressed lateral tibial plateau fracture. Fracture components have been reduced into significantly improved alignment. The depression of the articular surface of the lateral tibial plateau appears reduced. The orthopedic hardware is well-seated. No evidence of an operative complication. IMPRESSION: 1. ORIF of a comminuted depressed right lateral tibial plateau fracture. Fracture components have been well reduced. Orthopedic hardware is well positioned. Electronically Signed   By: Lajean Manes M.D.   On: 02/29/2016 20:17    EKG: Orders placed or performed during the hospital encounter of 03/24/15  . EKG 12-Lead  . EKG 12-Lead      Hospital Course: Patient was admitted to Hospital for the above states problem after xrays proved positive for a fracture of the right tibial plateau.  Following appropriate workup and evaluation in the office, the patient was admitted and taken to the OR and underwent the above state procedure without complications.  Patient tolerated  the procedure well and was later transferred to the recovery room and then to the orthopaedic floor for postoperative care.  They were given PO and IV analgesics for pain control following their surgery.  They were given 24 hours of postoperative antibiotics of  Anti-infectives    Start     Dose/Rate Route Frequency Ordered Stop   03/01/16 0030  ceFAZolin (ANCEF) IVPB 2g/100 mL premix     2 g 200 mL/hr over 30 Minutes Intravenous Every 6 hours 02/29/16 2203 03/01/16 1312   02/29/16 1430  ceFAZolin (ANCEF) IVPB 2g/100 mL premix     2 g 200 mL/hr over 30 Minutes Intravenous On call to O.R. 02/29/16 1418 02/29/16 1900     and started on DVT prophylaxis in the form of Lovenox.   PT and OT were ordered for gait training and ambulation.  The patient's weight bearing status  was TDWB only with therapy. Discharge planning was consulted to help with postop disposition and equipment needs.  Patient had a toguh night on the evening of surgery.  They started to get up OOB with therapy on day one. Social worker was consulted to assist with placement of the patient. They continued to work with therapy into day two.  Dressing was changed on day two and the incision was clean and dry.  On day three, the patient had progressed with therapy and meeting their goals.  Incision was healing well.  Patient was seen in rounds on day four by Dr. Wynelle Link and was ready to go to the SNF.  Discharge to SNF Diet - Cardiac diet Follow up - in 2 weeks Activity - WBAT Disposition - Skilled nursing facility Condition Upon Discharge - Stable D/C Meds - See DC Summary DVT Prophylaxis - Aspirin 325 mg twice a day for three weeks and then resume back to a baby 81 mg Aspirin  Discharge Instructions    Call MD / Call 911    Complete by:  As directed   If you experience chest pain or shortness of breath, CALL 911 and be transported to the hospital emergency room.  If you develope a fever above 101 F, pus (white drainage) or increased drainage or redness at the wound, or calf pain, call your surgeon's office.   Constipation Prevention    Complete by:  As directed   Drink plenty of fluids.  Prune juice may be helpful.  You may use a stool softener, such as Colace (over the counter) 100 mg twice a day.  Use MiraLax (over the counter) for constipation as needed.   Diet - low sodium heart healthy    Complete by:  As directed   Discharge instructions    Complete by:  As directed   Touch down weightbearing right leg Shower only, no tub bath. Change dressing daily with sterile gauze and paper tape.  No lotions or creams over the incision. Take aspirin 372m twice daily to prevent blood clots for three weeks, then reduce back to a baby 81 mg Aspirin. Follow up in office with Dr. AWynelle Linkin 2 weeks.    Driving restrictions    Complete by:  As directed   No driving   Increase activity slowly as tolerated    Complete by:  As directed       Medication List    STOP taking these medications   HYDROcodone-acetaminophen 5-325 MG tablet Commonly known as:  NORCO/VICODIN     TAKE these medications   aspirin EC 325 MG tablet  Take 1 tablet (325 mg total) by mouth 2 (two) times daily. Take a full dose Aspirin 325 mg twice a day to prevent blood clots, then reduce back to the daily baby 81 mg Aspirin What changed:  medication strength  how much to take  when to take this  additional instructions   bisacodyl 10 MG suppository Commonly known as:  DULCOLAX Place 1 suppository (10 mg total) rectally daily as needed for moderate constipation.   cetirizine 10 MG tablet Commonly known as:  ZYRTEC Take 10 mg by mouth daily.   diphenoxylate-atropine 2.5-0.025 MG tablet Commonly known as:  LOMOTIL Take 1 tablet by mouth 3 (three) times daily as needed for diarrhea or loose stools.   docusate sodium 100 MG capsule Commonly known as:  COLACE Take 1 capsule (100 mg total) by mouth 2 (two) times daily.   fluticasone 50 MCG/ACT nasal spray Commonly known as:  FLONASE Place 2 sprays into the nose 2 (two) times daily as needed for allergies.   gabapentin 300 MG capsule Commonly known as:  NEURONTIN Take 1 capsule (300 mg total) by mouth 3 (three) times daily. What changed:  when to take this  reasons to take this   levothyroxine 100 MCG tablet Commonly known as:  SYNTHROID, LEVOTHROID Take 100 mcg by mouth daily before breakfast.   lisinopril 20 MG tablet Commonly known as:  PRINIVIL,ZESTRIL Take 20 mg by mouth daily.   methocarbamol 500 MG tablet Commonly known as:  ROBAXIN Take 1 tablet (500 mg total) by mouth every 6 (six) hours as needed for muscle spasms.   metoCLOPramide 5 MG tablet Commonly known as:  REGLAN Take 1 tablet (5 mg total) by mouth every 8 (eight) hours as  needed for nausea (if ondansetron (ZOFRAN) ineffective.).   multivitamin with minerals Tabs tablet Take 1 tablet by mouth daily.   omeprazole 20 MG capsule Commonly known as:  PRILOSEC Take 20 mg by mouth 2 (two) times daily.   ondansetron 4 MG tablet Commonly known as:  ZOFRAN Take 1 tablet (4 mg total) by mouth every 6 (six) hours as needed for nausea.   oxyCODONE 5 MG immediate release tablet Commonly known as:  Oxy IR/ROXICODONE Take 1-2 tablets (5-10 mg total) by mouth every 3 (three) hours as needed for moderate pain or severe pain.   polyethylene glycol packet Commonly known as:  MIRALAX / GLYCOLAX Take 17 g by mouth daily as needed for mild constipation.   promethazine 25 MG tablet Commonly known as:  PHENERGAN Take 25 mg by mouth daily as needed for nausea or vomiting.   sertraline 100 MG tablet Commonly known as:  ZOLOFT Take 1 tablet (100 mg total) by mouth at bedtime.   sodium phosphate 7-19 GM/118ML Enem Place 133 mLs (1 enema total) rectally once as needed for severe constipation.   triamterene-hydrochlorothiazide 37.5-25 MG tablet Commonly known as:  MAXZIDE-25 Take 1 tablet by mouth daily.      Follow-up Information    Gearlean Alf, MD. Schedule an appointment as soon as possible for a visit on 03/14/2016.   Specialty:  Orthopedic Surgery Contact information: [address] [place] [address] [phone]           Signed: [name], PA-C Orthopaedic Surgery 03/04/2016, 7:33 AM

## 2016-03-04 NOTE — Progress Notes (Signed)
Occupational Therapy Treatment Patient Details Name: Jessica [name] MRN: [id number] DOB: 1950-11-28 Today's Date: 03/04/2016    History of present illness Pt s/p fall with L hand fx and R tibial plateau fx.     OT comments  Patient progressing very well. Continue OT per plan of care.  Follow Up Recommendations  SNF    Equipment Recommendations  Other (comment) (tbd next venue of care)    Recommendations for Other Services      Precautions / Restrictions Precautions Precautions: Fall Required Braces or Orthoses: Knee Immobilizer - Right;Other Brace/Splint Knee Immobilizer - Right: On at all times Other Brace/Splint: L hand splint Restrictions Weight Bearing Restrictions: Yes LUE Weight Bearing: Non weight bearing RLE Weight Bearing: Touchdown weight bearing Other Position/Activity Restrictions: presumed NWB through L hand due to hand fx L       Mobility Bed Mobility Overal bed mobility: Needs Assistance Bed Mobility: Supine to Sit     Supine to sit: Supervision;HOB elevated     General bed mobility comments: cues to avoid WBing L hand  Transfers Overall transfer level: Needs assistance Equipment used: Left platform walker Transfers: Sit to/from Stand;Stand Pivot Transfers Sit to Stand: From elevated surface;Min assist;Min guard Stand pivot transfers: Min assist;Min guard       General transfer comment: cues for hand placement, precautions/WB limitations UE/LE    Balance                                ADL Overall ADL's : Needs assistance/impaired     Grooming: Wash/dry hands;Wash/dry face;Oral care;Brushing hair;Set up;Sitting           Upper Body Dressing : Set up;Sitting   Lower Body Dressing: Maximal assistance;Sit to/from stand   Toilet Transfer: Minimal assistance;Stand-pivot;BSC;RW Armed forces technical officer Details (indicate cue type and reason): L platform RW Toileting- Clothing Manipulation and Hygiene: Maximal assistance;Sit to/from  stand       Functional mobility during ADLs: Minimal assistance;+2 for safety/equipment;Rolling walker        Vision                     Perception     Praxis      Cognition   Behavior During Therapy: WFL for tasks assessed/performed Overall Cognitive Status: Within Functional Limits for tasks assessed                       Extremity/Trunk Assessment               Exercises     Shoulder Instructions       General Comments      Pertinent Vitals/ Pain       Pain Assessment: No/denies pain  Home Living                                          Prior Functioning/Environment              Frequency Min 2X/week     Progress Toward Goals  OT Goals(current goals can now be found in the care plan section)  Progress towards OT goals: Progressing toward goals  Acute Rehab OT Goals Patient Stated Goal: [place] Discharge plan remains appropriate    Co-evaluation    PT/OT/SLP Co-Evaluation/Treatment: Yes Reason for Co-Treatment: For patient/therapist  safety PT goals addressed during session: Mobility/safety with mobility OT goals addressed during session: ADL's and self-care      End of Session Equipment Utilized During Treatment: Gait belt;Right knee immobilizer;Other (comment) (L platform RW)   Activity Tolerance Patient tolerated treatment well   Patient Left in chair;with call bell/phone within reach;with chair alarm set;with family/visitor present   Nurse Communication Mobility status        Time: 1036-1101 OT Time Calculation (min): 25 min  Charges: OT General Charges $OT Visit: 1 Procedure OT Treatments $Self Care/Home Management : 8-22 mins  Dereck Agerton A 03/04/2016, 12:50 PM

## 2016-03-04 NOTE — Progress Notes (Signed)
Physical Therapy Treatment Patient Details Name: Jessica [name] MRN: [id number] DOB: 1950/07/18 Today's Date: 03/04/2016    History of Present Illness Pt s/p fall with L hand fx and R tibial plateau fx.      PT Comments    Pt making excellent progress today; activity tolerance/gait distance improved and requiring fewer cues/less overall assist for functional tasks  Follow Up Recommendations  SNF     Equipment Recommendations       Recommendations for Other Services       Precautions / Restrictions Precautions Precautions: Fall Required Braces or Orthoses: Knee Immobilizer - Right;Other Brace/Splint Knee Immobilizer - Right: On at all times Restrictions Weight Bearing Restrictions: Yes LUE Weight Bearing: Non weight bearing (hand) RLE Weight Bearing: Touchdown weight bearing Other Position/Activity Restrictions: presumed NWB through L hand due to hand fx L    Mobility  Bed Mobility Overal bed mobility: Needs Assistance Bed Mobility: Supine to Sit     Supine to sit: Supervision;HOB elevated     General bed mobility comments: cues to avoid WBing L hand  Transfers Overall transfer level: Needs assistance Equipment used: Left platform walker Transfers: Sit to/from Stand;Stand Pivot Transfers Sit to Stand: From elevated surface;Min assist;Min guard Stand pivot transfers: Min assist;Min guard       General transfer comment: cues for hand placement, precautions/WB limitations UE/LE  Ambulation/Gait Ambulation/Gait assistance: Min assist Ambulation Distance (Feet): 18 Feet Assistive device: Left platform walker Gait Pattern/deviations: Trunk flexed     General Gait Details: cues for posture, R LE management and sequence.  Pt managing TDWB/NWB on RLE, improved ability to clear floor with LLE during steps   Stairs            Wheelchair Mobility    Modified Rankin (Stroke Patients Only)       Balance           Standing balance support:  Bilateral upper extremity supported Standing balance-Leahy Scale: Poor Standing balance comment: reliant on UEs butt less so than previous day                    Cognition Arousal/Alertness: Awake/alert Behavior During Therapy: WFL for tasks assessed/performed Overall Cognitive Status: Within Functional Limits for tasks assessed                      Exercises      General Comments        Pertinent Vitals/Pain Pain Assessment: No/denies pain    Home Living                      Prior Function            PT Goals (current goals can now be found in the care plan section) Acute Rehab PT Goals Patient Stated Goal: Regain IND PT Goal Formulation: With patient Time For Goal Achievement: 03/07/16 Potential to Achieve Goals: Fair Progress towards PT goals: Progressing toward goals    Frequency  Min 5X/week    PT Plan Current plan remains appropriate    Co-evaluation PT/OT/SLP Co-Evaluation/Treatment: Yes Reason for Co-Treatment: For patient/therapist safety PT goals addressed during session: Mobility/safety with mobility;Balance;Proper use of DME       End of Session Equipment Utilized During Treatment: Gait belt;Right knee immobilizer;Other (comment) Activity Tolerance: Patient tolerated treatment well Patient left: in chair;with call bell/phone within reach;with family/visitor present     Time: 1037-1101 PT Time Calculation (min) (ACUTE ONLY): 24 min  Charges:  $Gait Training: 8-22 mins                    G Codes:      Jessica [name] 03/09/2016, 11:15 AM

## 2016-03-04 NOTE — Clinical Social Work Placement (Signed)
Patient is set to discharge to [facility] today. Patient & husband at bedside aware. Discharge packet given to RN, Amy. [facility] called for transport to pickup at 11:30am.     Raynaldo Opitz, [place] Worker cell #: [phone]    CLINICAL SOCIAL WORK PLACEMENT  NOTE  Date:  03/04/2016  Patient Details  Name: Jessica [name] MRN: [id number] Date of Birth: 09-Aug-1950  Clinical Social Work is seeking post-discharge placement for this patient at the [place] level of care (*CSW will initial, date and re-position this form in  chart as items are completed):  Yes   Patient/family provided with [place] Work Department's list of facilities offering this level of care within the geographic area requested by the patient (or if unable, by the patient's family).  Yes   Patient/family informed of their freedom to choose among providers that offer the needed level of care, that participate in Medicare, Medicaid or managed care program needed by the patient, have an available bed and are willing to accept the patient.  Yes   Patient/family informed of [place]'s ownership interest in [facility] and [facility], as well as of the fact that they are under no obligation to receive care at these facilities.  PASRR submitted to EDS on 03/02/16     PASRR number received on 03/04/16     Existing PASRR number confirmed on       FL2 transmitted to all facilities in geographic area requested by pt/family on 03/04/16     FL2 transmitted to all facilities within larger geographic area on       Patient informed that his/her managed care company has contracts with or will negotiate with certain facilities, including the following:        Yes   Patient/family informed of bed offers received.  Patient chooses bed at [facility]     Physician recommends and patient chooses bed at      Patient to be  transferred to [facility] on 03/04/16.  Patient to be transferred to facility by [facility]     Patient family notified on 03/04/16 of transfer.  Name of family member notified:  patient's husband via phone     PHYSICIAN       Additional Comment:    _______________________________________________ Standley Brooking, LCSW 03/04/2016, 10:38 AM

## 2016-03-25 ENCOUNTER — Telehealth (HOSPITAL_COMMUNITY): Payer: Self-pay | Admitting: *Deleted

## 2016-03-25 NOTE — Telephone Encounter (Signed)
PHONE CALL TO HAVE PATIENT COME A.M. INSTEAD OF P.M. ON 04/05/16 DUE TO OFFICE CLOSED P.M. ON FRIDAY.  PATIENT SAID SHE FELL AND BROKE HER LEG. SHE CAN'T COME OUT UNLESS HER HUSBAND BRINGS HER. SHE DON'T THINK SHE NEEDS REFILLS.

## 2016-04-05 ENCOUNTER — Ambulatory Visit (HOSPITAL_COMMUNITY): Payer: Self-pay | Admitting: Psychiatry

## 2016-04-24 ENCOUNTER — Ambulatory Visit (INDEPENDENT_AMBULATORY_CARE_PROVIDER_SITE_OTHER): Payer: 59 | Admitting: Psychiatry

## 2016-04-24 ENCOUNTER — Encounter (HOSPITAL_COMMUNITY): Payer: Self-pay | Admitting: Psychiatry

## 2016-04-24 VITALS — BP 127/76 | HR 84 | Ht 62.0 in | Wt 162.0 lb

## 2016-04-24 DIAGNOSIS — Z818 Family history of other mental and behavioral disorders: Secondary | ICD-10-CM

## 2016-04-24 DIAGNOSIS — Z800 Family history of malignant neoplasm of digestive organs: Secondary | ICD-10-CM

## 2016-04-24 DIAGNOSIS — F331 Major depressive disorder, recurrent, moderate: Secondary | ICD-10-CM

## 2016-04-24 MED ORDER — SERTRALINE HCL 100 MG PO TABS: 100.0000 mg | ORAL_TABLET | Freq: Every day | ORAL | 2 refills | Status: DC
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
## 2016-07-10 ENCOUNTER — Other Ambulatory Visit: Payer: Self-pay | Admitting: Physician Assistant

## 2016-07-10 DIAGNOSIS — Z4789 Encounter for other orthopedic aftercare: Secondary | ICD-10-CM

## 2016-07-11 ENCOUNTER — Ambulatory Visit
Admission: RE | Admit: 2016-07-11 | Discharge: 2016-07-11 | Disposition: A | Discharge status: Home / Self Care | Payer: 59 | Source: Ambulatory Visit | Attending: Physician Assistant | Admitting: Physician Assistant

## 2016-07-11 DIAGNOSIS — Z4789 Encounter for other orthopedic aftercare: Secondary | ICD-10-CM

## 2016-07-23 ENCOUNTER — Telehealth (HOSPITAL_COMMUNITY): Payer: Self-pay | Admitting: *Deleted

## 2016-07-23 NOTE — Telephone Encounter (Signed)
RETURNED PHONE CALL, APPOINTMENT CANCELED AS PATIENT REQUESTED.

## 2016-07-25 ENCOUNTER — Ambulatory Visit (HOSPITAL_COMMUNITY): Payer: Self-pay | Admitting: Psychiatry

## 2016-07-29 NOTE — Progress Notes (Signed)
Need orders in [place] for 08-07-16 surgery

## 2016-07-30 ENCOUNTER — Ambulatory Visit: Payer: Self-pay | Admitting: Orthopedic Surgery

## 2016-07-30 ENCOUNTER — Encounter (HOSPITAL_COMMUNITY)
Admission: RE | Admit: 2016-07-30 | Discharge: 2016-07-30 | Disposition: A | Discharge status: Home / Self Care | Payer: 59 | Source: Ambulatory Visit | Attending: Orthopedic Surgery | Admitting: Orthopedic Surgery

## 2016-07-30 ENCOUNTER — Encounter (HOSPITAL_COMMUNITY): Payer: Self-pay

## 2016-07-30 DIAGNOSIS — Z01810 Encounter for preprocedural cardiovascular examination: Secondary | ICD-10-CM | POA: Insufficient documentation

## 2016-07-30 DIAGNOSIS — Z01812 Encounter for preprocedural laboratory examination: Secondary | ICD-10-CM | POA: Diagnosis present

## 2016-07-30 HISTORY — DX: Cardiac arrhythmia, unspecified: I49.9

## 2016-07-30 LAB — BASIC METABOLIC PANEL
ANION GAP: 8 (ref 5–15)
BUN: 10 mg/dL (ref 6–20)
CO2: 24 mmol/L (ref 22–32)
Calcium: 9.4 mg/dL (ref 8.9–10.3)
Chloride: 108 mmol/L (ref 101–111)
Creatinine, Ser: 0.95 mg/dL (ref 0.44–1.00)
GFR calc Af Amer: >60 mL/min (ref >60)
GFR calc non Af Amer: >60 mL/min (ref >60)
GLUCOSE: 91 mg/dL (ref 65–99)
POTASSIUM: 3.6 mmol/L (ref 3.5–5.1)
Sodium: 140 mmol/L (ref 135–145)

## 2016-07-30 LAB — CBC
HEMATOCRIT: 31.6 % — AB (ref 36.0–46.0)
HEMOGLOBIN: 10.1 g/dL — AB (ref 12.0–15.0)
MCH: 26.7 pg (ref 26.0–34.0)
MCHC: 32 g/dL (ref 30.0–36.0)
MCV: 83.6 fL (ref 78.0–100.0)
Platelets: 270 10*3/uL (ref 150–400)
RBC: 3.78 MIL/uL — ABNORMAL LOW (ref 3.87–5.11)
RDW: 14.7 % (ref 11.5–15.5)
WBC: 5.1 10*3/uL (ref 4.0–10.5)

## 2016-07-30 NOTE — Patient Instructions (Signed)
Jessica [name]  07/30/2016   Your procedure is scheduled on: 08/07/16  Report to [facility]  Entrance take [place]  elevators to 3rd floor to  [place] at     [phone].  Call this number if you have problems the morning of surgery [phone]   Remember: ONLY 1 PERSON MAY GO WITH YOU TO SHORT STAY TO GET  READY MORNING OF YOUR SURGERY.  Do not eat food or drink liquids :After Midnight.     Take these medicines the morning of surgery with A SIP OF WATER:  Xanax if needed, Flonase if needed, Gabapentin, Synthroid, prilosec,                                You may not have any metal on your body including hair pins and              piercings  Do not wear jewelry, make-up, lotions, powders or perfumes, deodorant             Do not wear nail polish.  Do not shave  48 hours prior to surgery.              Men may shave face and neck.   Do not bring valuables to the hospital. [place].  Contacts, dentures or bridgework may not be worn into surgery.  Leave suitcase in the car. After surgery it may be brought to your room.                  Please read over the following fact sheets you were given: _____________________________________________________________________             [facility] - Preparing for Surgery Before surgery, you can play an important role.  Because skin is not sterile, your skin needs to be as free of germs as possible.  You can reduce the number of germs on your skin by washing with CHG (chlorahexidine gluconate) soap before surgery.  CHG is an antiseptic cleaner which kills germs and bonds with the skin to continue killing germs even after washing. Please DO NOT use if you have an allergy to CHG or antibacterial soaps.  If your skin becomes reddened/irritated stop using the CHG and inform your nurse when you arrive at Short Stay. Do not shave (including legs and underarms) for at least  48 hours prior to the first CHG shower.  You may shave your face/neck. Please follow these instructions carefully:  1.  Shower with CHG Soap the night before surgery and the  morning of Surgery.  2.  If you choose to wash your hair, wash your hair first as usual with your  normal  shampoo.  3.  After you shampoo, rinse your hair and body thoroughly to remove the  shampoo.                           4.  Use CHG as you would any other liquid soap.  You can apply chg directly  to the skin and wash                       Gently with  a scrungie or clean washcloth.  5.  Apply the CHG Soap to your body ONLY FROM THE NECK DOWN.   Do not use on face/ open                           Wound or open sores. Avoid contact with eyes, ears mouth and genitals (private parts).                       Wash face,  Genitals (private parts) with your normal soap.             6.  Wash thoroughly, paying special attention to the area where your surgery  will be performed.  7.  Thoroughly rinse your body with warm water from the neck down.  8.  DO NOT shower/wash with your normal soap after using and rinsing off  the CHG Soap.                9.  Pat yourself dry with a clean towel.            10.  Wear clean pajamas.            11.  Place clean sheets on your bed the night of your first shower and do not  sleep with pets. Day of Surgery : Do not apply any lotions/deodorants the morning of surgery.  Please wear clean clothes to the hospital/surgery center.  FAILURE TO FOLLOW THESE INSTRUCTIONS MAY RESULT IN THE CANCELLATION OF YOUR SURGERY PATIENT SIGNATURE_________________________________  NURSE SIGNATURE__________________________________  ________________________________________________________________________

## 2016-07-30 NOTE — Progress Notes (Signed)
CBC done 07/30/16 faxed via EPIC to Dr Wynelle Link.

## 2016-07-31 NOTE — Progress Notes (Signed)
07/30/16- Final EKG done in epic

## 2016-08-07 ENCOUNTER — Ambulatory Visit (HOSPITAL_COMMUNITY): Payer: 59 | Admitting: Certified Registered Nurse Anesthetist

## 2016-08-07 ENCOUNTER — Observation Stay (HOSPITAL_COMMUNITY)
Admission: RE | Admit: 2016-08-07 | Discharge: 2016-08-08 | Disposition: A | Discharge status: Home / Self Care | Payer: 59 | Source: Ambulatory Visit | Attending: Orthopedic Surgery | Admitting: Orthopedic Surgery

## 2016-08-07 ENCOUNTER — Encounter (HOSPITAL_COMMUNITY)
Admission: RE | Disposition: A | Discharge status: Home / Self Care | Payer: Self-pay | Source: Ambulatory Visit | Attending: Orthopedic Surgery

## 2016-08-07 ENCOUNTER — Encounter (HOSPITAL_COMMUNITY): Payer: Self-pay | Admitting: *Deleted

## 2016-08-07 DIAGNOSIS — Z9049 Acquired absence of other specified parts of digestive tract: Secondary | ICD-10-CM | POA: Insufficient documentation

## 2016-08-07 DIAGNOSIS — Z79899 Other long term (current) drug therapy: Secondary | ICD-10-CM | POA: Insufficient documentation

## 2016-08-07 DIAGNOSIS — Z7982 Long term (current) use of aspirin: Secondary | ICD-10-CM | POA: Insufficient documentation

## 2016-08-07 DIAGNOSIS — Z9889 Other specified postprocedural states: Secondary | ICD-10-CM | POA: Diagnosis not present

## 2016-08-07 DIAGNOSIS — F329 Major depressive disorder, single episode, unspecified: Secondary | ICD-10-CM | POA: Diagnosis not present

## 2016-08-07 DIAGNOSIS — K5010 Crohn's disease of large intestine without complications: Secondary | ICD-10-CM | POA: Insufficient documentation

## 2016-08-07 DIAGNOSIS — Z96652 Presence of left artificial knee joint: Secondary | ICD-10-CM | POA: Diagnosis not present

## 2016-08-07 DIAGNOSIS — G629 Polyneuropathy, unspecified: Secondary | ICD-10-CM | POA: Diagnosis not present

## 2016-08-07 DIAGNOSIS — G4730 Sleep apnea, unspecified: Secondary | ICD-10-CM | POA: Insufficient documentation

## 2016-08-07 DIAGNOSIS — Z90710 Acquired absence of both cervix and uterus: Secondary | ICD-10-CM | POA: Insufficient documentation

## 2016-08-07 DIAGNOSIS — E039 Hypothyroidism, unspecified: Secondary | ICD-10-CM | POA: Insufficient documentation

## 2016-08-07 DIAGNOSIS — M8580 Other specified disorders of bone density and structure, unspecified site: Secondary | ICD-10-CM | POA: Diagnosis not present

## 2016-08-07 DIAGNOSIS — Y793 Surgical instruments, materials and orthopedic devices (including sutures) associated with adverse incidents: Secondary | ICD-10-CM | POA: Insufficient documentation

## 2016-08-07 DIAGNOSIS — K219 Gastro-esophageal reflux disease without esophagitis: Secondary | ICD-10-CM | POA: Insufficient documentation

## 2016-08-07 DIAGNOSIS — Z791 Long term (current) use of non-steroidal anti-inflammatories (NSAID): Secondary | ICD-10-CM | POA: Insufficient documentation

## 2016-08-07 DIAGNOSIS — T8484XA Pain due to internal orthopedic prosthetic devices, implants and grafts, initial encounter: Principal | ICD-10-CM | POA: Diagnosis present

## 2016-08-07 DIAGNOSIS — M659 Synovitis and tenosynovitis, unspecified: Secondary | ICD-10-CM | POA: Insufficient documentation

## 2016-08-07 DIAGNOSIS — Z96641 Presence of right artificial hip joint: Secondary | ICD-10-CM | POA: Insufficient documentation

## 2016-08-07 DIAGNOSIS — G8929 Other chronic pain: Secondary | ICD-10-CM | POA: Diagnosis not present

## 2016-08-07 DIAGNOSIS — M549 Dorsalgia, unspecified: Secondary | ICD-10-CM | POA: Insufficient documentation

## 2016-08-07 DIAGNOSIS — F419 Anxiety disorder, unspecified: Secondary | ICD-10-CM | POA: Diagnosis not present

## 2016-08-07 DIAGNOSIS — I10 Essential (primary) hypertension: Secondary | ICD-10-CM | POA: Diagnosis not present

## 2016-08-07 DIAGNOSIS — Z7951 Long term (current) use of inhaled steroids: Secondary | ICD-10-CM | POA: Insufficient documentation

## 2016-08-07 HISTORY — PX: HARDWARE REMOVAL: SHX979

## 2016-08-07 LAB — CBC
HEMATOCRIT: 29.4 % — AB (ref 36.0–46.0)
HEMOGLOBIN: 9.5 g/dL — AB (ref 12.0–15.0)
MCH: 27.5 pg (ref 26.0–34.0)
MCHC: 32.3 g/dL (ref 30.0–36.0)
MCV: 85 fL (ref 78.0–100.0)
Platelets: 197 10*3/uL (ref 150–400)
RBC: 3.46 MIL/uL — AB (ref 3.87–5.11)
RDW: 15.1 % (ref 11.5–15.5)
WBC: 4.9 10*3/uL (ref 4.0–10.5)

## 2016-08-07 LAB — CREATININE, SERUM
CREATININE: 1.17 mg/dL — AB (ref 0.44–1.00)
GFR, EST AFRICAN AMERICAN: 56 mL/min — AB (ref >60)
GFR, EST NON AFRICAN AMERICAN: 48 mL/min — AB (ref >60)

## 2016-08-07 SURGERY — REMOVAL, HARDWARE
Anesthesia: General | Laterality: Right

## 2016-08-07 MED ORDER — SODIUM CHLORIDE 0.9 % IV SOLN
INTRAVENOUS | Status: DC
  Administered 2016-08-07: 17:00:00 via INTRAVENOUS

## 2016-08-07 MED ORDER — HYDROMORPHONE HCL 1 MG/ML IJ SOLN: 0.5000 mg | INTRAMUSCULAR | Status: DC | PRN

## 2016-08-07 MED ORDER — CHLORHEXIDINE GLUCONATE 4 % EX LIQD: 60.0000 mL | Freq: Once | CUTANEOUS | Status: DC

## 2016-08-07 MED ORDER — EPHEDRINE SULFATE 50 MG/ML IJ SOLN
INTRAMUSCULAR | Status: DC | PRN
  Administered 2016-08-07: 10 mg via INTRAVENOUS

## 2016-08-07 MED ORDER — POVIDONE-IODINE 10 % EX SWAB: 2.0000 "application " | Freq: Once | CUTANEOUS | Status: DC

## 2016-08-07 MED ORDER — DEXAMETHASONE SODIUM PHOSPHATE 10 MG/ML IJ SOLN: 10.0000 mg | Freq: Once | INTRAMUSCULAR | Status: DC

## 2016-08-07 MED ORDER — ACETAMINOPHEN 650 MG RE SUPP: 650.0000 mg | Freq: Four times a day (QID) | RECTAL | Status: DC | PRN

## 2016-08-07 MED ORDER — OXYCODONE HCL 5 MG PO TABS
5.0000 mg | ORAL_TABLET | ORAL | Status: DC | PRN
  Administered 2016-08-07 – 2016-08-08 (×5): 10 mg via ORAL
  Filled 2016-08-07 (×5): qty 2

## 2016-08-07 MED ORDER — LEVOTHYROXINE SODIUM 100 MCG PO TABS
100.0000 ug | ORAL_TABLET | Freq: Every day | ORAL | Status: DC
  Administered 2016-08-08: 100 ug via ORAL
  Filled 2016-08-07: qty 1

## 2016-08-07 MED ORDER — LACTATED RINGERS IV SOLN: INTRAVENOUS | Status: DC

## 2016-08-07 MED ORDER — ALPRAZOLAM 0.25 MG PO TABS
0.2500 mg | ORAL_TABLET | Freq: Two times a day (BID) | ORAL | Status: DC | PRN
  Filled 2016-08-07: qty 1

## 2016-08-07 MED ORDER — HYDROMORPHONE HCL 1 MG/ML IJ SOLN
INTRAMUSCULAR | Status: AC
  Filled 2016-08-07: qty 1

## 2016-08-07 MED ORDER — LACTATED RINGERS IV SOLN
INTRAVENOUS | Status: DC
  Administered 2016-08-07: 1000 mL via INTRAVENOUS

## 2016-08-07 MED ORDER — PROPOFOL 10 MG/ML IV BOLUS
INTRAVENOUS | Status: AC
Start: 2016-08-07 — End: 2016-08-07
  Filled 2016-08-07: qty 20

## 2016-08-07 MED ORDER — FENTANYL CITRATE (PF) 100 MCG/2ML IJ SOLN
50.0000 ug | INTRAMUSCULAR | Status: DC | PRN
  Administered 2016-08-07: 100 ug via INTRAVENOUS
  Administered 2016-08-07: 50 ug via INTRAVENOUS

## 2016-08-07 MED ORDER — LORATADINE 10 MG PO TABS: 10.0000 mg | ORAL_TABLET | Freq: Every day | ORAL | Status: DC | PRN

## 2016-08-07 MED ORDER — METOCLOPRAMIDE HCL 5 MG PO TABS: 5.0000 mg | ORAL_TABLET | Freq: Three times a day (TID) | ORAL | Status: DC | PRN

## 2016-08-07 MED ORDER — PROMETHAZINE HCL 25 MG PO TABS: 25.0000 mg | ORAL_TABLET | Freq: Every day | ORAL | Status: DC | PRN

## 2016-08-07 MED ORDER — LIDOCAINE HCL (CARDIAC) 20 MG/ML IV SOLN
INTRAVENOUS | Status: DC | PRN
  Administered 2016-08-07: 50 mg via INTRAVENOUS

## 2016-08-07 MED ORDER — ENOXAPARIN SODIUM 40 MG/0.4ML ~~LOC~~ SOLN
40.0000 mg | SUBCUTANEOUS | Status: DC
  Administered 2016-08-08: 40 mg via SUBCUTANEOUS
  Filled 2016-08-07: qty 0.4

## 2016-08-07 MED ORDER — MEPERIDINE HCL 50 MG/ML IJ SOLN: 6.2500 mg | INTRAMUSCULAR | Status: DC | PRN

## 2016-08-07 MED ORDER — CEFAZOLIN SODIUM-DEXTROSE 2-4 GM/100ML-% IV SOLN
INTRAVENOUS | Status: AC
  Filled 2016-08-07: qty 100

## 2016-08-07 MED ORDER — HYDROMORPHONE HCL 1 MG/ML IJ SOLN
0.2500 mg | INTRAMUSCULAR | Status: DC | PRN
  Administered 2016-08-07 (×3): 0.5 mg via INTRAVENOUS

## 2016-08-07 MED ORDER — CEFAZOLIN SODIUM-DEXTROSE 2-4 GM/100ML-% IV SOLN
2.0000 g | INTRAVENOUS | Status: AC
  Administered 2016-08-07: 2 g via INTRAVENOUS

## 2016-08-07 MED ORDER — PROPOFOL 10 MG/ML IV BOLUS
INTRAVENOUS | Status: DC | PRN
  Administered 2016-08-07: 120 mg via INTRAVENOUS

## 2016-08-07 MED ORDER — CEFAZOLIN SODIUM-DEXTROSE 2-4 GM/100ML-% IV SOLN
2.0000 g | Freq: Four times a day (QID) | INTRAVENOUS | Status: AC
  Administered 2016-08-07 – 2016-08-08 (×3): 2 g via INTRAVENOUS
  Filled 2016-08-07 (×3): qty 100

## 2016-08-07 MED ORDER — ACETAMINOPHEN 10 MG/ML IV SOLN
INTRAVENOUS | Status: AC
  Filled 2016-08-07: qty 100

## 2016-08-07 MED ORDER — ONDANSETRON HCL 4 MG PO TABS: 4.0000 mg | ORAL_TABLET | Freq: Four times a day (QID) | ORAL | Status: DC | PRN

## 2016-08-07 MED ORDER — ACETAMINOPHEN 325 MG PO TABS: 650.0000 mg | ORAL_TABLET | Freq: Four times a day (QID) | ORAL | Status: DC | PRN

## 2016-08-07 MED ORDER — TRIAMTERENE-HCTZ 37.5-25 MG PO TABS
1.0000 | ORAL_TABLET | Freq: Every day | ORAL | Status: DC
  Administered 2016-08-08: 1 via ORAL
  Filled 2016-08-07: qty 1

## 2016-08-07 MED ORDER — LIDOCAINE 2% (20 MG/ML) 5 ML SYRINGE
INTRAMUSCULAR | Status: AC
  Filled 2016-08-07: qty 5

## 2016-08-07 MED ORDER — ONDANSETRON HCL 4 MG/2ML IJ SOLN
INTRAMUSCULAR | Status: DC | PRN
  Administered 2016-08-07: 4 mg via INTRAVENOUS

## 2016-08-07 MED ORDER — ONDANSETRON HCL 4 MG/2ML IJ SOLN: 4.0000 mg | Freq: Once | INTRAMUSCULAR | Status: DC | PRN

## 2016-08-07 MED ORDER — HYDROMORPHONE HCL 1 MG/ML IJ SOLN
0.5000 mg | INTRAMUSCULAR | Status: DC | PRN
  Administered 2016-08-07: 0.5 mg via INTRAVENOUS
  Filled 2016-08-07: qty 0.5

## 2016-08-07 MED ORDER — KETOROLAC TROMETHAMINE 30 MG/ML IJ SOLN
INTRAMUSCULAR | Status: DC | PRN
  Administered 2016-08-07: 30 mg via INTRAVENOUS

## 2016-08-07 MED ORDER — ACETAMINOPHEN 10 MG/ML IV SOLN
1000.0000 mg | Freq: Once | INTRAVENOUS | Status: AC
  Administered 2016-08-07: 1000 mg via INTRAVENOUS
  Filled 2016-08-07: qty 100

## 2016-08-07 MED ORDER — ACETAMINOPHEN 500 MG PO TABS
1000.0000 mg | ORAL_TABLET | Freq: Four times a day (QID) | ORAL | Status: DC
  Administered 2016-08-07 – 2016-08-08 (×3): 1000 mg via ORAL
  Filled 2016-08-07 (×3): qty 2

## 2016-08-07 MED ORDER — 0.9 % SODIUM CHLORIDE (POUR BTL) OPTIME
TOPICAL | Status: DC | PRN
  Administered 2016-08-07: 1000 mL

## 2016-08-07 MED ORDER — METOCLOPRAMIDE HCL 5 MG/ML IJ SOLN: 5.0000 mg | Freq: Three times a day (TID) | INTRAMUSCULAR | Status: DC | PRN

## 2016-08-07 MED ORDER — FENTANYL CITRATE (PF) 100 MCG/2ML IJ SOLN
INTRAMUSCULAR | Status: AC
  Filled 2016-08-07: qty 4

## 2016-08-07 MED ORDER — GABAPENTIN 400 MG PO CAPS
800.0000 mg | ORAL_CAPSULE | Freq: Four times a day (QID) | ORAL | Status: DC
  Administered 2016-08-07 – 2016-08-08 (×3): 800 mg via ORAL
  Filled 2016-08-07 (×3): qty 2

## 2016-08-07 MED ORDER — EPHEDRINE 5 MG/ML INJ
INTRAVENOUS | Status: AC
  Filled 2016-08-07: qty 10

## 2016-08-07 MED ORDER — MIDAZOLAM HCL 5 MG/5ML IJ SOLN
INTRAMUSCULAR | Status: DC | PRN
  Administered 2016-08-07: 2 mg via INTRAVENOUS

## 2016-08-07 MED ORDER — SERTRALINE HCL 100 MG PO TABS
100.0000 mg | ORAL_TABLET | Freq: Every day | ORAL | Status: DC
  Administered 2016-08-08: 100 mg via ORAL
  Filled 2016-08-07: qty 1

## 2016-08-07 MED ORDER — PANTOPRAZOLE SODIUM 40 MG PO TBEC
40.0000 mg | DELAYED_RELEASE_TABLET | Freq: Two times a day (BID) | ORAL | Status: DC
  Administered 2016-08-07 – 2016-08-08 (×2): 40 mg via ORAL
  Filled 2016-08-07 (×2): qty 1

## 2016-08-07 MED ORDER — MIDAZOLAM HCL 2 MG/2ML IJ SOLN
INTRAMUSCULAR | Status: AC
  Filled 2016-08-07: qty 2

## 2016-08-07 MED ORDER — DIPHENOXYLATE-ATROPINE 2.5-0.025 MG PO TABS: 1.0000 | ORAL_TABLET | Freq: Three times a day (TID) | ORAL | Status: DC | PRN

## 2016-08-07 MED ORDER — FLUTICASONE PROPIONATE 50 MCG/ACT NA SUSP: 2.0000 | Freq: Every day | NASAL | Status: DC | PRN

## 2016-08-07 MED ORDER — ONDANSETRON HCL 4 MG/2ML IJ SOLN: 4.0000 mg | Freq: Four times a day (QID) | INTRAMUSCULAR | Status: DC | PRN

## 2016-08-07 MED ORDER — ONDANSETRON HCL 4 MG/2ML IJ SOLN
INTRAMUSCULAR | Status: AC
  Filled 2016-08-07: qty 2

## 2016-08-07 MED ORDER — FENTANYL CITRATE (PF) 100 MCG/2ML IJ SOLN
INTRAMUSCULAR | Status: AC
  Filled 2016-08-07: qty 2

## 2016-08-07 SURGICAL SUPPLY — 45 items
BANDAGE ACE 6X5 VEL STRL LF (GAUZE/BANDAGES/DRESSINGS) ×2 IMPLANT
BANDAGE ESMARK 6X9 LF (GAUZE/BANDAGES/DRESSINGS) ×1 IMPLANT
BNDG CMPR 9X6 STRL LF SNTH (GAUZE/BANDAGES/DRESSINGS) ×1
BNDG ESMARK 6X9 LF (GAUZE/BANDAGES/DRESSINGS) ×2
CUFF TOURN SGL QUICK 18 (TOURNIQUET CUFF) IMPLANT
CUFF TOURN SGL QUICK 34 (TOURNIQUET CUFF)
CUFF TRNQT CYL 34X4X40X1 (TOURNIQUET CUFF) IMPLANT
DRAPE C-ARM 42X120 X-RAY (DRAPES) ×2 IMPLANT
DRAPE C-ARMOR (DRAPES) ×1 IMPLANT
DRAPE EXTREMITY T 121X128X90 (DRAPE) ×2 IMPLANT
DRAPE INCISE IOBAN 66X45 STRL (DRAPES) ×2 IMPLANT
DRAPE ORTHO SPLIT 77X108 STRL (DRAPES) ×2
DRAPE SURG ORHT 6 SPLT 77X108 (DRAPES) IMPLANT
DRSG ADAPTIC 3X8 NADH LF (GAUZE/BANDAGES/DRESSINGS) ×2 IMPLANT
DRSG PAD ABDOMINAL 8X10 ST (GAUZE/BANDAGES/DRESSINGS) ×2 IMPLANT
DURAPREP 26ML APPLICATOR (WOUND CARE) ×2 IMPLANT
ELECT REM PT RETURN 9FT ADLT (ELECTROSURGICAL) ×2
ELECTRODE REM PT RTRN 9FT ADLT (ELECTROSURGICAL) ×1 IMPLANT
GAUZE SPONGE 4X4 12PLY STRL (GAUZE/BANDAGES/DRESSINGS) ×2 IMPLANT
GLOVE BIO SURGEON STRL SZ 6.5 (GLOVE) ×1 IMPLANT
GLOVE BIO SURGEON STRL SZ7.5 (GLOVE) ×2 IMPLANT
GLOVE BIO SURGEON STRL SZ8 (GLOVE) ×4 IMPLANT
GLOVE BIOGEL PI IND STRL 6.5 (GLOVE) IMPLANT
GLOVE BIOGEL PI IND STRL 7.5 (GLOVE) IMPLANT
GLOVE BIOGEL PI IND STRL 8 (GLOVE) ×3 IMPLANT
GLOVE BIOGEL PI INDICATOR 6.5 (GLOVE) ×2
GLOVE BIOGEL PI INDICATOR 7.5 (GLOVE) ×1
GLOVE BIOGEL PI INDICATOR 8 (GLOVE)
GOWN STRL REUS W/TWL LRG LVL3 (GOWN DISPOSABLE) ×2 IMPLANT
GOWN STRL REUS W/TWL XL LVL3 (GOWN DISPOSABLE) ×2 IMPLANT
KIT BASIN OR (CUSTOM PROCEDURE TRAY) ×2 IMPLANT
MANIFOLD NEPTUNE II (INSTRUMENTS) ×2 IMPLANT
NS IRRIG 1000ML POUR BTL (IV SOLUTION) ×2 IMPLANT
PACK TOTAL JOINT (CUSTOM PROCEDURE TRAY) ×1 IMPLANT
PADDING CAST COTTON 6X4 STRL (CAST SUPPLIES) ×4 IMPLANT
POSITIONER SURGICAL ARM (MISCELLANEOUS) ×2 IMPLANT
STAPLER VISISTAT 35W (STAPLE) ×1 IMPLANT
STRIP CLOSURE SKIN 1/2X4 (GAUZE/BANDAGES/DRESSINGS) ×2 IMPLANT
SUT MNCRL AB 4-0 PS2 18 (SUTURE) ×2 IMPLANT
SUT VIC AB 0 CT1 36 (SUTURE) ×4 IMPLANT
SUT VIC AB 2-0 CT1 27 (SUTURE) ×4
SUT VIC AB 2-0 CT1 TAPERPNT 27 (SUTURE) ×2 IMPLANT
TOWEL OR 17X26 10 PK STRL BLUE (TOWEL DISPOSABLE) ×4 IMPLANT
UNDERPAD 30X30 INCONTINENT (UNDERPADS AND DIAPERS) ×2 IMPLANT
WATER STERILE IRR 1500ML POUR (IV SOLUTION) ×1 IMPLANT

## 2016-08-07 NOTE — Transfer of Care (Signed)
Immediate Anesthesia Transfer of Care Note  Patient: Jessica [name]  Procedure(s) Performed: Procedure(s): HARDWARE REMOVAL (Right)  Patient Location: PACU  Anesthesia Type:General  Level of Consciousness: awake, alert  and oriented  Airway & Oxygen Therapy: Patient Spontanous Breathing and Patient connected to nasal cannula oxygen  Post-op Assessment: Report given to RN and Post -op Vital signs reviewed and stable  Post vital signs: Reviewed and stable  Last Vitals:  Vitals:   08/07/16 1000 08/07/16 1015  BP: 127/75 138/76  Pulse: 72 76  Resp: 12 (!) 9  Temp:      Last Pain:  Vitals:   08/07/16 1015  TempSrc:   PainSc: Asleep      Patients Stated Pain Goal: 3 (37/94/32 7614)  Complications: No apparent anesthesia complications

## 2016-08-07 NOTE — Anesthesia Preprocedure Evaluation (Addendum)
Anesthesia Evaluation  Patient identified by MRN, date of birth, ID band Patient awake    Reviewed: Allergy & Precautions, NPO status , Patient's Chart, lab work & pertinent test results  Airway Mallampati: I  TM Distance: >3 FB Neck ROM: Full    Dental   Pulmonary sleep apnea ,    Pulmonary exam normal        Cardiovascular hypertension, Pt. on medications Normal cardiovascular exam     Neuro/Psych    GI/Hepatic GERD  Medicated and Controlled,  Endo/Other    Renal/GU      Musculoskeletal   Abdominal   Peds  Hematology   Anesthesia Other Findings   Reproductive/Obstetrics                             Anesthesia Physical Anesthesia Plan  ASA: III  Anesthesia Plan: General   Post-op Pain Management:    Induction: Intravenous  Airway Management Planned: LMA  Additional Equipment:   Intra-op Plan:   Post-operative Plan: Extubation in OR  Informed Consent: I have reviewed the patients History and Physical, chart, labs and discussed the procedure including the risks, benefits and alternatives for the proposed anesthesia with the patient or authorized representative who has indicated his/her understanding and acceptance.     Plan Discussed with: CRNA and Surgeon  Anesthesia Plan Comments:         Anesthesia Quick Evaluation

## 2016-08-07 NOTE — Anesthesia Postprocedure Evaluation (Signed)
Anesthesia Post Note  Patient: Jessica [name]  Procedure(s) Performed: Procedure(s) (LRB): HARDWARE REMOVAL (Right)  Patient location during evaluation: PACU Anesthesia Type: General Level of consciousness: awake and alert Pain management: pain level controlled Vital Signs Assessment: post-procedure vital signs reviewed and stable Respiratory status: spontaneous breathing, nonlabored ventilation, respiratory function stable and patient connected to nasal cannula oxygen Cardiovascular status: blood pressure returned to baseline and stable Postop Assessment: no signs of nausea or vomiting Anesthetic complications: no       Last Vitals:  Vitals:   08/07/16 1239 08/07/16 1252  BP:  103/75  Pulse: 94   Resp: 12 12  Temp:  36.4 C    Last Pain:  Vitals:   08/07/16 1252  TempSrc:   PainSc: [place] DAVID

## 2016-08-07 NOTE — H&P (Signed)
CC- Jessica [name] is a 66 y.o. female who presents with right knee pain.  HPI- . Knee Pain: Patient presents with knee pain involving the  right knee. Onset of the symptoms was several months ago. Inciting event: She had a right tibial plateau fracture treated with ORIF in 8/17 and has had persistent pain. Unfortunately she developed painful swelling under the incision and was found to have a fluid collection containing staph species. She had a CT which showed that the fracture has essentially healed and presents today for hardware removal and irrigation and debridement.. She has not had any fever, chills or systemic symptoms related to the infection.  Past Medical History:  Diagnosis Date  . Anxiety attack   . Arthritis   . Back pain, chronic   . Crohn's colitis ([place])   . Depression (emotion)   . Dysrhythmia   . GERD (gastroesophageal reflux disease)   . Hypertension   . Hypothyroid   . Neuropathy ([place])   . Sleep apnea    cpap is not working per patient - needs to find a new doctor - not used since 7/16 per pat   . Vertigo    followed by Dr Wardell Heath- in [place]     Past Surgical History:  Procedure Laterality Date  . ABDOMINAL HYSTERECTOMY    . CHOLECYSTECTOMY    . COLON SURGERY     for Crohn's in the 39s in [place]  . COLONOSCOPY N/A 03/11/2013   Procedure: COLONOSCOPY;  Surgeon: Rogene Houston, MD;  Location: [facility];  Service: Endoscopy;  Laterality: N/A;  1200  . fatty tumor removed    . Hip replacement      Rt hip in 2010 in [place]  . KNEE ARTHROSCOPY Left 03/29/2015   Procedure: ARTHROSCOPY LEFT KNEE WITH MENSICAL DEBRIDEMENT, chondroplasty;  Surgeon: Gaynelle Arabian, MD;  Location: [facility];  Service: Orthopedics;  Laterality: Left;  . ORIF TIBIA PLATEAU Right 02/29/2016   Procedure: OPEN REDUCTION INTERNAL FIXATION (ORIF RIGHT  TIBIAL PLATEAU FRACTURE;  Surgeon: Gaynelle Arabian, MD;  Location: [facility];  Service: Orthopedics;  Laterality: Right;    Prior to Admission  medications   Medication Sig Start Date End Date Taking? Authorizing Provider  aspirin EC 81 MG tablet Take 81 mg by mouth daily.   Yes Historical Provider, MD  cephALEXin (KEFLEX) 500 MG capsule Take 500 mg by mouth 3 (three) times daily. 07/23/16  Yes Historical Provider, MD  cetirizine (ZYRTEC) 10 MG tablet Take 10 mg by mouth daily as needed for allergies.    Yes Historical Provider, MD  fluticasone (FLONASE) 50 MCG/ACT nasal spray Place 2 sprays into the nose daily as needed for allergies.    Yes Historical Provider, MD  gabapentin (NEURONTIN) 800 MG tablet Take 800 mg by mouth 4 (four) times daily. 06/21/16  Yes Historical Provider, MD  HYDROcodone-acetaminophen (NORCO) 10-325 MG tablet Take 1 tablet by mouth every 6 (six) hours as needed for pain. 05/22/16  Yes Historical Provider, MD  levothyroxine (SYNTHROID, LEVOTHROID) 100 MCG tablet Take 100 mcg by mouth daily before breakfast.  10/25/15  Yes Historical Provider, MD  lisinopril (PRINIVIL,ZESTRIL) 20 MG tablet Take 20 mg by mouth daily.   Yes Historical Provider, MD  naproxen (NAPROSYN) 500 MG tablet Take 500 mg by mouth 2 (two) times daily as needed (for pain.).   Yes Historical Provider, MD  omeprazole (PRILOSEC) 20 MG capsule Take 20 mg by mouth 2 (two) times daily.    Yes Historical Provider,  MD  sertraline (ZOLOFT) 100 MG tablet Take 1 tablet (100 mg total) by mouth at bedtime. Patient taking differently: Take 100 mg by mouth daily.  04/24/16  Yes Cloria Spring, MD  ALPRAZolam Duanne Moron) 0.25 MG tablet Take 0.25 mg by mouth 2 (two) times daily as needed (for anxiety.).     Historical Provider, MD  aspirin EC 325 MG tablet Take 1 tablet (325 mg total) by mouth 2 (two) times daily. Take a full dose Aspirin 325 mg twice a day to prevent blood clots, then reduce back to the daily baby 81 mg Aspirin Patient not taking: Reported on 07/29/2016 03/04/16   Alexzandrew L Dara Lords, PA-C  bisacodyl (DULCOLAX) 10 MG suppository Place 1 suppository (10  mg total) rectally daily as needed for moderate constipation. Patient not taking: Reported on 07/29/2016 03/04/16   Alexzandrew L Dara Lords, PA-C  diphenoxylate-atropine (LOMOTIL) 2.5-0.025 MG per tablet Take 1 tablet by mouth 3 (three) times daily as needed for diarrhea or loose stools. 09/13/14   Rogene Houston, MD  docusate sodium (COLACE) 100 MG capsule Take 1 capsule (100 mg total) by mouth 2 (two) times daily. Patient not taking: Reported on 07/29/2016 03/04/16   Alexzandrew L Perkins, PA-C  gabapentin (NEURONTIN) 300 MG capsule Take 1 capsule (300 mg total) by mouth 3 (three) times daily. Patient not taking: Reported on 07/29/2016 12/05/15   Cloria Spring, MD  methocarbamol (ROBAXIN) 500 MG tablet Take 1 tablet (500 mg total) by mouth every 6 (six) hours as needed for muscle spasms. Patient not taking: Reported on 07/29/2016 03/04/16   Alexzandrew L Dara Lords, PA-C  metoCLOPramide (REGLAN) 5 MG tablet Take 1 tablet (5 mg total) by mouth every 8 (eight) hours as needed for nausea (if ondansetron (ZOFRAN) ineffective.). 03/04/16   Alexzandrew L Perkins, PA-C  Multiple Vitamin (MULTIVITAMIN WITH MINERALS) TABS tablet Take 1 tablet by mouth daily.    Historical Provider, MD  ondansetron (ZOFRAN) 4 MG tablet Take 1 tablet (4 mg total) by mouth every 6 (six) hours as needed for nausea. Patient not taking: Reported on 07/29/2016 03/04/16   Alexzandrew L Perkins, PA-C  oxyCODONE (OXY IR/ROXICODONE) 5 MG immediate release tablet Take 1-2 tablets (5-10 mg total) by mouth every 3 (three) hours as needed for moderate pain or severe pain. Patient not taking: Reported on 07/29/2016 03/04/16   Alexzandrew L Perkins, PA-C  polyethylene glycol (MIRALAX / GLYCOLAX) packet Take 17 g by mouth daily as needed for mild constipation. Patient not taking: Reported on 07/29/2016 03/04/16   Alexzandrew L Dara Lords, PA-C  promethazine (PHENERGAN) 25 MG tablet Take 25 mg by mouth daily as needed for nausea or vomiting.  02/16/16   Historical  Provider, MD  sodium phosphate (FLEET) 7-19 GM/118ML ENEM Place 133 mLs (1 enema total) rectally once as needed for severe constipation. Patient not taking: Reported on 07/29/2016 03/04/16   Alexzandrew L Dara Lords, PA-C  triamterene-hydrochlorothiazide (MAXZIDE-25) 37.5-25 MG tablet Take 1 tablet by mouth daily. 02/13/16   Historical Provider, MD   Right Knee Exam antalgic gait, soft tissue tenderness over lateral leg proximally, no effusion but swelling present adjacent to incision, collateral ligaments intact, no warmth or erythema around incision  Physical Examination: General appearance - alert, well appearing, and in no distress Mental status - alert, oriented to person, place, and time Chest - clear to auscultation, no wheezes, rales or rhonchi, symmetric air entry Heart - normal rate, regular rhythm, normal S1, S2, no murmurs, rubs, clicks or gallops Abdomen - soft,  nontender, nondistended, no masses or organomegaly Neurological - alert, oriented, normal speech, no focal findings or movement disorder noted   Asessment/Plan--- Right knee painful hardware- - Plan right knee hardware removal. Procedure risks and potential comps discussed with patient who elects to proceed. Goals are decreased pain and increased function with a high likelihood of achieving both

## 2016-08-07 NOTE — Anesthesia Procedure Notes (Signed)
Procedure Name: LMA Insertion Date/Time: 08/07/2016 8:38 AM Performed by: Chandra Batch A Pre-anesthesia Checklist: Patient identified, Emergency Drugs available, Timeout performed, Suction available and Patient being monitored Patient Re-evaluated:Patient Re-evaluated prior to inductionOxygen Delivery Method: Circle system utilized Preoxygenation: Pre-oxygenation with 100% oxygen Intubation Type: IV induction Ventilation: Mask ventilation without difficulty LMA: LMA flexible inserted LMA Size: 4.0 Number of attempts: 1 Placement Confirmation: positive ETCO2 and breath sounds checked- equal and bilateral Dental Injury: Teeth and Oropharynx as per pre-operative assessment

## 2016-08-07 NOTE — Interval H&P Note (Signed)
History and Physical Interval Note:  08/07/2016 8:18 AM  Jessica [name]  has presented today for surgery, with the diagnosis of Painful hardware right knee  The various methods of treatment have been discussed with the patient and family. After consideration of risks, benefits and other options for treatment, the patient has consented to  Procedure(s): HARDWARE REMOVAL (Right) as a surgical intervention .  The patient's history has been reviewed, patient examined, no change in status, stable for surgery.  I have reviewed the patient's chart and labs.  Questions were answered to the patient's satisfaction.     Gearlean Alf

## 2016-08-07 NOTE — Op Note (Signed)
NAMEALEXANDERA, KUNTZMAN                ACCOUNT NO.:  [id number]  MEDICAL RECORD NO.:  [id number]  LOCATION:                                 FACILITY:  PHYSICIAN:  Gaynelle Arabian, M.D.    DATE OF BIRTH:  Jul 27, 1950  DATE OF PROCEDURE:  08/07/2016 DATE OF DISCHARGE:                              OPERATIVE REPORT   PREOPERATIVE DIAGNOSIS:  Painful hardware, right knee.  POSTOPERATIVE DIAGNOSIS:  Painful hardware, right knee.  PROCEDURE:  Right knee hardware removal.  SURGEON:  Gaynelle Arabian, M.D.  ASSISTANT:  No assistant.  ANESTHESIA:  General.  ESTIMATED BLOOD LOSS:  Minimal.  DRAINS:  Hemovac x1.  TOURNIQUET TIME:  22 minutes at 300 mmHg.  COMPLICATIONS:  None.  CONDITION:  Stable to recovery.  BRIEF CLINICAL NOTE:  Jessica [name] is a 66 year old female who underwent open reduction and internal fixation of a right tibial plateau fracture in August 2017.  She has healed the fracture, but has gone on to develop significant pain related to the hardware, also developed fluid collection, which grew Staph species.  Given the fluid collection and painful hardware, it was decided to do a hardware removal, irrigation, and debridement.  PROCEDURE IN DETAIL:  After successful administration of general anesthetic, a tourniquet was placed high on her right thigh.  Right lower extremity prepped and draped in the usual sterile fashion. Extremities wrapped in Esmarch, and tourniquet inflated to 300 mmHg.  A midline incision was made with a 10 blade and then we made the horizontal limb out laterally.  This was a hockey sticklike incision. This plate was identified along the vertical part of the incision and the tissues subperiosteally elevated over the plate.  Similarly, we did the same in the horizontal aspect.  The flap was elevated out laterally. The hardware was in good position.  I did not encounter any pockets of purulence.  There was a little bit of a fibrinous debris around the muscle  and I debrided that back to normal-appearing tissue.  The screws were then removed from the plate.  This was Biomet locking plate for the tibial plateau.  All the screws were removed and the plate was taken out.  The tissue did have a healthy appearance.  The wound was then copiously irrigated with a liter of saline solution.  The tourniquet was released, total time of 22 minutes.  Minor bleeding was stopped with cautery.  We placed a Hemovac drain and closed the wound over the drain.  The subcu was closed with interrupted 2-0 Vicryl and the skin closed with staples.  The drain was hooked to suction.  Incision was cleaned and dried and a bulky sterile dressing applied.  She was then awakened and transported to recovery in stable condition.     Gaynelle Arabian, M.D.     FA/MEDQ  D:  08/07/2016  T:  08/07/2016  Job:  [id number]

## 2016-08-07 NOTE — Brief Op Note (Signed)
08/07/2016  9:39 AM  PATIENT:  Jessica [name]  66 y.o. female  PRE-OPERATIVE DIAGNOSIS:  Painful hardware right knee  POST-OPERATIVE DIAGNOSIS:  Painful hardware right knee  PROCEDURE:  Procedure(s): HARDWARE REMOVAL (Right)  SURGEON:  Surgeon(s) and Role:    * Gaynelle Arabian, MD - Primary  PHYSICIAN ASSISTANT:   ASSISTANTS: none   ANESTHESIA:   general  EBL:  Total I/O In: -  Out: 10 [Blood:10]  BLOOD ADMINISTERED:none  DRAINS: (Medium) Hemovact drain(s) in the right knee with  Suction Open   LOCAL MEDICATIONS USED:  NONE  COUNTS:  YES  TOURNIQUET:   Total Tourniquet Time Documented: Thigh (Right) - 23 minutes Total: Thigh (Right) - 23 minutes   DICTATION: .Other Dictation: Dictation Number [id number]  PLAN OF CARE: Admit for overnight observation  PATIENT DISPOSITION:  PACU - hemodynamically stable.

## 2016-08-08 ENCOUNTER — Encounter (HOSPITAL_COMMUNITY): Payer: Self-pay | Admitting: Orthopedic Surgery

## 2016-08-08 DIAGNOSIS — T8484XA Pain due to internal orthopedic prosthetic devices, implants and grafts, initial encounter: Secondary | ICD-10-CM | POA: Diagnosis not present

## 2016-08-08 MED ORDER — OXYCODONE HCL 5 MG PO TABS: 5.0000 mg | ORAL_TABLET | ORAL | 0 refills | Status: DC | PRN

## 2016-08-08 MED ORDER — METHOCARBAMOL 500 MG PO TABS: 500.0000 mg | ORAL_TABLET | Freq: Four times a day (QID) | ORAL | 0 refills | Status: DC | PRN

## 2016-08-08 NOTE — Evaluation (Signed)
Physical Therapy Evaluation Patient Details Name: Jessica [name] MRN: [id number] DOB: 15-Feb-1951 Today's Date: 08/08/2016   History of Present Illness  Pt s/p painful hardware removal R knee.  Tib plateau fx 8/17  Clinical Impression  Pt admitted as above and presenting with functional mobility limitations 2* decreased strength/ROM R LE and post op pain.  Pt plans dc home with family assist this date.    Follow Up Recommendations Outpatient PT    Equipment Recommendations  None recommended by PT    Recommendations for Other Services       Precautions / Restrictions Precautions Precautions: Fall Restrictions Weight Bearing Restrictions: No Other Position/Activity Restrictions: WBAT      Mobility  Bed Mobility Overal bed mobility: Needs Assistance Bed Mobility: Supine to Sit;Sit to Supine     Supine to sit: Supervision Sit to supine: Supervision      Transfers Overall transfer level: Needs assistance Equipment used: Rolling walker (2 wheeled) Transfers: Sit to/from Stand Sit to Stand: Min guard;Supervision         General transfer comment: cues for use of UEs to self assist and safety awareness  Ambulation/Gait Ambulation/Gait assistance: Min guard;Supervision Ambulation Distance (Feet): 147 Feet Assistive device: Rolling walker (2 wheeled) Gait Pattern/deviations: Step-to pattern;Step-through pattern;Decreased step length - right;Decreased step length - left;Shuffle;Trunk flexed Gait velocity: decr Gait velocity interpretation: Below normal speed for age/gender General Gait Details: cues for posture and position from RW  Stairs            Wheelchair Mobility    Modified Rankin (Stroke Patients Only)       Balance                                             Pertinent Vitals/Pain Pain Assessment: 0-10 Pain Score: 5  Pain Location: R knee Pain Descriptors / Indicators: Aching;Sore Pain Intervention(s): Limited activity  within patient's tolerance;Monitored during session;Premedicated before session;Ice applied    Home Living Family/patient expects to be discharged to:: Private residence Living Arrangements: Spouse/significant other Available Help at Discharge: Family;Available PRN/intermittently Type of Home: House Home Access: Ramped entrance     Home Layout: One level Home Equipment: Bedside commode;Cane - single point;Walker - 2 wheels;Shower seat;Transport chair      Prior Function Level of Independence: Independent;Independent with assistive device(s)               Hand Dominance        Extremity/Trunk Assessment   Upper Extremity Assessment Upper Extremity Assessment: Overall WFL for tasks assessed    Lower Extremity Assessment Lower Extremity Assessment: RLE deficits/detail RLE Deficits / Details: AAROM at knee -10 - 60    Cervical / Trunk Assessment Cervical / Trunk Assessment: Normal  Communication   Communication: No difficulties  Cognition Arousal/Alertness: Awake/alert Behavior During Therapy: WFL for tasks assessed/performed Overall Cognitive Status: Within Functional Limits for tasks assessed                      General Comments      Exercises Total Joint Exercises Ankle Circles/Pumps: AROM;Both;15 reps;Supine Quad Sets: AROM;Both;10 reps;Supine Heel Slides: AROM;Right;15 reps;Supine Straight Leg Raises: AAROM;Right;15 reps;Supine Long Arc Quad: AROM;Right;10 reps;Supine Knee Flexion: AAROM;Right;10 reps;Supine   Assessment/Plan    PT Assessment Patient needs continued PT services  PT Problem List Decreased strength;Decreased range of motion;Decreased activity tolerance;Decreased mobility;Decreased knowledge of  use of DME;Pain          PT Treatment Interventions DME instruction;Gait training;Functional mobility training;Therapeutic activities;Therapeutic exercise;Patient/family education    PT Goals (Current goals can be found in the Care  Plan section)  Acute Rehab PT Goals Patient Stated Goal: Regain IND PT Goal Formulation: With patient Time For Goal Achievement: 08/08/16    Frequency Min 1X/week   Barriers to discharge        Co-evaluation               End of Session Equipment Utilized During Treatment: Gait belt Activity Tolerance: Patient tolerated treatment well Patient left: in bed;with call bell/phone within reach Nurse Communication: Mobility status    Functional Assessment Tool Used: clinical judgement Functional Limitation: Mobility: Walking and moving around Mobility: Walking and Moving Around Current Status (P9470): At least 1 percent but less than 20 percent impaired, limited or restricted Mobility: Walking and Moving Around Goal Status [phone]): At least 1 percent but less than 20 percent impaired, limited or restricted Mobility: Walking and Moving Around Discharge Status [phone]): At least 1 percent but less than 20 percent impaired, limited or restricted    Time: 1011-1045 PT Time Calculation (min) (ACUTE ONLY): 34 min   Charges:   PT Evaluation $PT Eval Low Complexity: 1 Procedure PT Treatments $Therapeutic Exercise: 8-22 mins   PT G Codes:   PT G-Codes **NOT FOR INPATIENT CLASS** Functional Assessment Tool Used: clinical judgement Functional Limitation: Mobility: Walking and moving around Mobility: Walking and Moving Around Current Status (D5789): At least 1 percent but less than 20 percent impaired, limited or restricted Mobility: Walking and Moving Around Goal Status [phone]): At least 1 percent but less than 20 percent impaired, limited or restricted Mobility: Walking and Moving Around Discharge Status [phone]): At least 1 percent but less than 20 percent impaired, limited or restricted    Brya Simerly 08/08/2016, 1:21 PM

## 2016-08-08 NOTE — Progress Notes (Signed)
RN reviewed discharge instructions with patient and family. All questions answered.   Paperwork and prescriptions given.   NT rolled patient down with all belongings to family car.

## 2016-08-08 NOTE — Discharge Summary (Signed)
Physician Discharge Summary   Patient ID: Jessica [name] MRN: [id number] DOB/AGE: 1951-05-31 66 y.o.  Admit date: 08/07/2016 Discharge date: 08/08/2016  Primary Diagnosis:  Painful hardware, right knee.  Admission Diagnoses:  Past Medical History:  Diagnosis Date  . Anxiety attack   . Arthritis   . Back pain, chronic   . Crohn's colitis ([place])   . Depression (emotion)   . Dysrhythmia   . GERD (gastroesophageal reflux disease)   . Hypertension   . Hypothyroid   . Neuropathy ([place])   . Sleep apnea    cpap is not working per patient - needs to find a new doctor - not used since 7/16 per pat   . Vertigo    followed by Dr Wardell Heath- in [place]    Discharge Diagnoses:   Principal Problem:   Painful orthopaedic hardware [facility])  Estimated body mass index is 28.72 kg/m as calculated from the following:   Height as of this encounter: 5' 2"  (1.575 m).   Weight as of this encounter: 71.2 kg (157 lb).  Procedure:  Procedure(s) (LRB): HARDWARE REMOVAL (Right)   Consults: None  HPI: Jessica [name] is a 66 year old female who underwent open reduction and internal fixation of a right tibial plateau fracture in August 2017.  She has healed the fracture, but has gone on to develop significant pain related to the hardware, also developed fluid collection, which grew Staph species.  Given the fluid collection and painful hardware, it was decided to do a hardware removal, irrigation, and debridement. Laboratory Data: Admission on 08/07/2016  Component Date Value Ref Range Status  . WBC 08/07/2016 4.9  4.0 - 10.5 K/uL Final  . RBC 08/07/2016 3.46* 3.87 - 5.11 MIL/uL Final  . Hemoglobin 08/07/2016 9.5* 12.0 - 15.0 g/dL Final  . HCT 08/07/2016 29.4* 36.0 - 46.0 % Final  . MCV 08/07/2016 85.0  78.0 - 100.0 fL Final  . MCH 08/07/2016 27.5  26.0 - 34.0 pg Final  . MCHC 08/07/2016 32.3  30.0 - 36.0 g/dL Final  . RDW 08/07/2016 15.1  11.5 - 15.5 % Final  . Platelets 08/07/2016 197  150 - 400 K/uL Final  .  Creatinine, Ser 08/07/2016 1.17* 0.44 - 1.00 mg/dL Final  . GFR calc non Af Amer 08/07/2016 48* >60 mL/min Final  . GFR calc Af Amer 08/07/2016 56* >60 mL/min Final   Comment: (NOTE) The eGFR has been calculated using the CKD EPI equation. This calculation has not been validated in all clinical situations. eGFR's persistently <60 mL/min signify possible Chronic Kidney Disease.   Hospital Outpatient Visit on 07/30/2016  Component Date Value Ref Range Status  . Sodium 07/30/2016 140  135 - 145 mmol/L Final  . Potassium 07/30/2016 3.6  3.5 - 5.1 mmol/L Final  . Chloride 07/30/2016 108  101 - 111 mmol/L Final  . CO2 07/30/2016 24  22 - 32 mmol/L Final  . Glucose, Bld 07/30/2016 91  65 - 99 mg/dL Final  . BUN 07/30/2016 10  6 - 20 mg/dL Final  . Creatinine, Ser 07/30/2016 0.95  0.44 - 1.00 mg/dL Final  . Calcium 07/30/2016 9.4  8.9 - 10.3 mg/dL Final  . GFR calc non Af Amer 07/30/2016 >60  >60 mL/min Final  . GFR calc Af Amer 07/30/2016 >60  >60 mL/min Final   Comment: (NOTE) The eGFR has been calculated using the CKD EPI equation. This calculation has not been validated in all clinical situations. eGFR's persistently <60 mL/min signify possible Chronic Kidney Disease.   Marland Kitchen  Anion gap 07/30/2016 8  5 - 15 Final  . WBC 07/30/2016 5.1  4.0 - 10.5 K/uL Final  . RBC 07/30/2016 3.78* 3.87 - 5.11 MIL/uL Final  . Hemoglobin 07/30/2016 10.1* 12.0 - 15.0 g/dL Final  . HCT 07/30/2016 31.6* 36.0 - 46.0 % Final  . MCV 07/30/2016 83.6  78.0 - 100.0 fL Final  . MCH 07/30/2016 26.7  26.0 - 34.0 pg Final  . MCHC 07/30/2016 32.0  30.0 - 36.0 g/dL Final  . RDW 07/30/2016 14.7  11.5 - 15.5 % Final  . Platelets 07/30/2016 270  150 - 400 K/uL Final     X-Rays:Ct Knee Right Wo Contrast  Result Date: 07/11/2016 CLINICAL DATA:  Patient fell with subsequent right tibial plateau fracture. Surgical ORIF August 2017. Continued pain, swelling and chronic infections. EXAM: CT OF THE RIGHT KNEE WITHOUT CONTRAST  TECHNIQUE: Multidetector CT imaging of the RIGHT knee was performed according to the standard protocol. Multiplanar CT image reconstructions were also generated. COMPARISON:  None. FINDINGS: Bones/Joint/Cartilage Remote comminuted lateral tibial plateau fracture transfixed with a lateral sideplate and multiple interlocking screws without hardware failure or complication. Large central depressed articular surface of the lateral tibial plateau measuring 2.9 x 2.7 cm with 10 mm of depression. The depressed area make contain fibrous tissue which is difficult to delineate by CT. No acute fracture or dislocation. Generalized periarticular osteopenia likely secondary to disuse. 2 fracture fragments are noted anterior to the expected location of the ACL insertion. Moderate nonspecific joint effusion with synovitis. There is moderate medial femorotibial compartment joint space narrowing. There is moderate lateral patellofemoral compartment joint space narrowing. Ligaments Suboptimally assessed by CT. Muscles and Tendons Muscles are normal. No significant muscle atrophy. No intramuscular hematoma. Quadriceps tendon and patellar tendons are grossly intact. Soft tissues No fluid collection or hematoma. Peripheral vascular atherosclerotic disease. IMPRESSION: 1. Remote comminuted lateral tibial plateau fracture transfixed with a lateral sideplate and multiple interlocking screws without hardware failure or complication. Large central depressed articular surface of the lateral tibial plateau measuring 2.9 x 2.7 cm with 10 mm of depression. The depressed area make contain fibrous tissue which is difficult to delineate by CT. 2 fracture fragments are noted anterior to the expected location of the ACL insertion. Electronically Signed   By: Kathreen Devoid   On: 07/11/2016 14:34    EKG: Orders placed or performed during the hospital encounter of 07/30/16  . EKG 12 lead  . EKG 12 lead     Hospital Course: Jessica [name] is a 66  y.o. who was admitted to [facility]. They were brought to the operating room on 08/07/2016 and underwent Procedure(s): HARDWARE REMOVAL.  Patient tolerated the procedure well and was later transferred to the recovery room and then to the orthopaedic floor for postoperative care.  They were given PO and IV analgesics for pain control following their surgery.  They were given 24 hours of postoperative antibiotics of  Anti-infectives    Start     Dose/Rate Route Frequency Ordered Stop   08/07/16 1430  ceFAZolin (ANCEF) IVPB 2g/100 mL premix     2 g 200 mL/hr over 30 Minutes Intravenous Every 6 hours 08/07/16 1320 08/08/16 0534   08/07/16 0604  ceFAZolin (ANCEF) IVPB 2g/100 mL premix     2 g 200 mL/hr over 30 Minutes Intravenous On call to O.R. 08/07/16 1749 08/07/16 0841     and started on DVT prophylaxis in the form of Aspirin.  Discharge planning consulted to  help with postop disposition and equipment needs.  Patient had a good night on the evening of surgery.  They started to get up OOB with therapy on day one. Arrangements were made and they were setup to go home on POD 1.  Diet - Cardiac diet Follow up - in 2 weeks Activity - WBAT Dressing - May remove the surgical dressing tomorrow at home and then apply a dry gauze dressing daily. May shower three days following surgery but do not submerge the incision under water. Disposition - Home Condition Upon Discharge - Good D/C Meds - See DC Summary DVT Prophylaxis - Aspirin  Discharge Instructions    Call MD / Call 911    Complete by:  As directed    If you experience chest pain or shortness of breath, CALL 911 and be transported to the hospital emergency room.  If you develope a fever above 101 F, pus (white drainage) or increased drainage or redness at the wound, or calf pain, call your surgeon's office.   Change dressing    Complete by:  As directed    Change dressing daily with sterile 4 x 4 inch gauze dressing and apply TED  hose. Do not submerge the incision under water.   Constipation Prevention    Complete by:  As directed    Drink plenty of fluids.  Prune juice may be helpful.  You may use a stool softener, such as Colace (over the counter) 100 mg twice a day.  Use MiraLax (over the counter) for constipation as needed.   Diet - low sodium heart healthy    Complete by:  As directed    Discharge instructions    Complete by:  As directed    Pick up stool softner and laxative for home use following surgery while on pain medications. Do not submerge incision under water. Please use good hand washing techniques while changing dressing each day. May shower starting three days after surgery. Please use a clean towel to pat the incision dry following showers. Continue to use ice for pain and swelling after surgery. Do not use any lotions or creams on the incision until instructed by your surgeon.  Wear both TED hose on both legs during the day every day for three weeks, but may have off at night at home.  Postoperative Constipation Protocol  Constipation - defined medically as fewer than three stools per week and severe constipation as less than one stool per week.  One of the most common issues patients have following surgery is constipation.  Even if you have a regular bowel pattern at home, your normal regimen is likely to be disrupted due to multiple reasons following surgery.  Combination of anesthesia, postoperative narcotics, change in appetite and fluid intake all can affect your bowels.  In order to avoid complications following surgery, here are some recommendations in order to help you during your recovery period.  Colace (docusate) - Pick up an over-the-counter form of Colace or another stool softener and take twice a day as long as you are requiring postoperative pain medications.  Take with a full glass of water daily.  If you experience loose stools or diarrhea, hold the colace until you stool forms  back up.  If your symptoms do not get better within 1 week or if they get worse, check with your doctor.  Dulcolax (bisacodyl) - Pick up over-the-counter and take as directed by the product packaging as needed to assist with the movement of your  bowels.  Take with a full glass of water.  Use this product as needed if not relieved by Colace only.   MiraLax (polyethylene glycol) - Pick up over-the-counter to have on hand.  MiraLax is a solution that will increase the amount of water in your bowels to assist with bowel movements.  Take as directed and can mix with a glass of water, juice, soda, coffee, or tea.  Take if you go more than two days without a movement. Do not use MiraLax more than once per day. Call your doctor if you are still constipated or irregular after using this medication for 7 days in a row.  If you continue to have problems with postoperative constipation, please contact the office for further assistance and recommendations.  If you experience "the worst abdominal pain ever" or develop nausea or vomiting, please contact the office immediatly for further recommendations for treatment.   Resume the Aspirin daily at home.  May start changing dressing daily tomorrow, Friday 08/09/2016, and apply a dry gauze dressing daily.   Do not put a pillow under the knee. Place it under the heel.    Complete by:  As directed    Do not sit on low chairs, stoools or toilet seats, as it may be difficult to get up from low surfaces    Complete by:  As directed    Driving restrictions    Complete by:  As directed    No driving until released by the physician.   Increase activity slowly as tolerated    Complete by:  As directed    Lifting restrictions    Complete by:  As directed    No lifting until released by the physician.   Patient may shower    Complete by:  As directed    You may shower without a dressing once there is no drainage.  Do not wash over the wound.  If drainage remains, do not  shower until drainage stops.   TED hose    Complete by:  As directed    Use stockings (TED hose) for 3 weeks on both leg(s).  You may remove them at night for sleeping.   Weight bearing as tolerated    Complete by:  As directed    Laterality:  right   Extremity:  Lower     Allergies as of 08/08/2016      Reactions   Morphine And Related Hives, Itching   May cause blood pressure to drop   Robaxin [methocarbamol] Itching   (PT WILL RESUME TAKING TO SEE IF THIS IS HER ALLERGY)   Sulfa Antibiotics Itching   ALL SULFA DRUGS      Medication List    STOP taking these medications   cephALEXin 500 MG capsule Commonly known as:  KEFLEX   HYDROcodone-acetaminophen 10-325 MG tablet Commonly known as:  NORCO   naproxen 500 MG tablet Commonly known as:  NAPROSYN     TAKE these medications   ALPRAZolam 0.25 MG tablet Commonly known as:  XANAX Take 0.25 mg by mouth 2 (two) times daily as needed (for anxiety.).   aspirin EC 81 MG tablet Take 81 mg by mouth daily. What changed:  Another medication with the same name was removed. Continue taking this medication, and follow the directions you see here.   bisacodyl 10 MG suppository Commonly known as:  DULCOLAX Place 1 suppository (10 mg total) rectally daily as needed for moderate constipation.   cetirizine 10 MG tablet Commonly  known as:  ZYRTEC Take 10 mg by mouth daily as needed for allergies.   diphenoxylate-atropine 2.5-0.025 MG tablet Commonly known as:  LOMOTIL Take 1 tablet by mouth 3 (three) times daily as needed for diarrhea or loose stools.   docusate sodium 100 MG capsule Commonly known as:  COLACE Take 1 capsule (100 mg total) by mouth 2 (two) times daily.   fluticasone 50 MCG/ACT nasal spray Commonly known as:  FLONASE Place 2 sprays into the nose daily as needed for allergies.   gabapentin 800 MG tablet Commonly known as:  NEURONTIN Take 800 mg by mouth 4 (four) times daily. What changed:  Another medication  with the same name was removed. Continue taking this medication, and follow the directions you see here.   levothyroxine 100 MCG tablet Commonly known as:  SYNTHROID, LEVOTHROID Take 100 mcg by mouth daily before breakfast.   lisinopril 20 MG tablet Commonly known as:  PRINIVIL,ZESTRIL Take 20 mg by mouth daily.   methocarbamol 500 MG tablet Commonly known as:  ROBAXIN Take 1 tablet (500 mg total) by mouth every 6 (six) hours as needed for muscle spasms.   metoCLOPramide 5 MG tablet Commonly known as:  REGLAN Take 1 tablet (5 mg total) by mouth every 8 (eight) hours as needed for nausea (if ondansetron (ZOFRAN) ineffective.).   multivitamin with minerals Tabs tablet Take 1 tablet by mouth daily.   omeprazole 20 MG capsule Commonly known as:  PRILOSEC Take 20 mg by mouth 2 (two) times daily.   ondansetron 4 MG tablet Commonly known as:  ZOFRAN Take 1 tablet (4 mg total) by mouth every 6 (six) hours as needed for nausea.   oxyCODONE 5 MG immediate release tablet Commonly known as:  Oxy IR/ROXICODONE Take 1-2 tablets (5-10 mg total) by mouth every 4 (four) hours as needed for moderate pain or severe pain. What changed:  when to take this   polyethylene glycol packet Commonly known as:  MIRALAX / GLYCOLAX Take 17 g by mouth daily as needed for mild constipation.   promethazine 25 MG tablet Commonly known as:  PHENERGAN Take 25 mg by mouth daily as needed for nausea or vomiting.   sertraline 100 MG tablet Commonly known as:  ZOLOFT Take 1 tablet (100 mg total) by mouth at bedtime. What changed:  when to take this   sodium phosphate 7-19 GM/118ML Enem Place 133 mLs (1 enema total) rectally once as needed for severe constipation.   triamterene-hydrochlorothiazide 37.5-25 MG tablet Commonly known as:  MAXZIDE-25 Take 1 tablet by mouth daily.      Follow-up Information    Gearlean Alf, MD. Schedule an appointment as soon as possible for a visit on 08/20/2016.     Specialty:  Orthopedic Surgery Contact information: [address] [place] [address] [phone]           Signed: [name], PA-C Orthopaedic Surgery 08/08/2016, 8:32 AM

## 2016-08-08 NOTE — Progress Notes (Signed)
   Subjective: 1 Day Post-Op Procedure(s) (LRB): HARDWARE REMOVAL (Right) Patient reports pain as mild.   Patient seen in rounds by Dr. Wynelle Link. Patient is well, but has had some minor complaints of pain in the knee, requiring pain medications We will setup for discharge. Plan is to go Home after hospital stay.  Objective: Vital signs in last 24 hours: Temp:  [97.2 F (36.2 C)-98.7 F (37.1 C)] 98.1 F (36.7 C) (02/01 0500) Pulse Rate:  [72-96] 72 (02/01 0500) Resp:  [10-16] 16 (02/01 0500) BP: (100-149)/(64-86) 149/71 (02/01 0500) SpO2:  [94 %-100 %] 96 % (02/01 0500)  Intake/Output from previous day: 01/31 0701 - 02/01 0700 In: 1967.5 [P.O.:480; I.V.:1487.5] Out: 360 [Urine:300; Drains:50; Blood:10] Intake/Output this shift: No intake/output data recorded.   Recent Labs  08/07/16 1345  HGB 9.5*    Recent Labs  08/07/16 1345  WBC 4.9  RBC 3.46*  HCT 29.4*  PLT 197    Recent Labs  08/07/16 1345  CREATININE 1.17*   No results for input(s): LABPT, INR in the last 72 hours.  EXAM General - Patient is Alert, Appropriate and Oriented Extremity - Neurovascular intact Sensation intact distally Dressing - dressing C/D/I Motor Function - intact, moving foot and toes well on exam.   Past Medical History:  Diagnosis Date  . Anxiety attack   . Arthritis   . Back pain, chronic   . Crohn's colitis ([place])   . Depression (emotion)   . Dysrhythmia   . GERD (gastroesophageal reflux disease)   . Hypertension   . Hypothyroid   . Neuropathy ([place])   . Sleep apnea    cpap is not working per patient - needs to find a new doctor - not used since 7/16 per pat   . Vertigo    followed by Dr Wardell Heath- in [place]     Assessment/Plan: 1 Day Post-Op Procedure(s) (LRB): HARDWARE REMOVAL (Right) Principal Problem:   Painful orthopaedic hardware ([place])  Estimated body mass index is 28.72 kg/m as calculated from the following:   Height as of this encounter: 5' 2"  (1.575 m).   Weight as of this encounter: 71.2 kg (157 lb). Up with therapy Discharge home F/U in two weeks DC Meds - see summary Diet - Cardiac Diet  DVT Prophylaxis - Aspirin Weight-Bearing as tolerated to right leg D/C O2 and Pulse OX and try on Room Air Home later after therapy.  [name], PA-C Orthopaedic Surgery 08/08/2016, 8:24 AM

## 2016-09-03 ENCOUNTER — Emergency Department (HOSPITAL_COMMUNITY)
Admission: EM | Admit: 2016-09-03 | Discharge: 2016-09-03 | Disposition: A | Discharge status: Home / Self Care | Payer: 59 | Attending: Emergency Medicine | Admitting: Emergency Medicine

## 2016-09-03 ENCOUNTER — Encounter (HOSPITAL_COMMUNITY): Payer: Self-pay | Admitting: *Deleted

## 2016-09-03 DIAGNOSIS — Z7982 Long term (current) use of aspirin: Secondary | ICD-10-CM | POA: Diagnosis not present

## 2016-09-03 DIAGNOSIS — Z79899 Other long term (current) drug therapy: Secondary | ICD-10-CM | POA: Insufficient documentation

## 2016-09-03 DIAGNOSIS — R112 Nausea with vomiting, unspecified: Secondary | ICD-10-CM | POA: Insufficient documentation

## 2016-09-03 DIAGNOSIS — R197 Diarrhea, unspecified: Secondary | ICD-10-CM | POA: Insufficient documentation

## 2016-09-03 DIAGNOSIS — I10 Essential (primary) hypertension: Secondary | ICD-10-CM | POA: Diagnosis not present

## 2016-09-03 DIAGNOSIS — E039 Hypothyroidism, unspecified: Secondary | ICD-10-CM | POA: Insufficient documentation

## 2016-09-03 DIAGNOSIS — R1013 Epigastric pain: Secondary | ICD-10-CM

## 2016-09-03 LAB — CBC WITH DIFFERENTIAL/PLATELET
BASOS ABS: 0 10*3/uL (ref 0.0–0.1)
BASOS PCT: 0 %
EOS ABS: 0.2 10*3/uL (ref 0.0–0.7)
EOS PCT: 4 %
HCT: 32 % — ABNORMAL LOW (ref 36.0–46.0)
Hemoglobin: 10.9 g/dL — ABNORMAL LOW (ref 12.0–15.0)
Lymphocytes Relative: 37 %
Lymphs Abs: 1.9 10*3/uL (ref 0.7–4.0)
MCH: 28.2 pg (ref 26.0–34.0)
MCHC: 34.1 g/dL (ref 30.0–36.0)
MCV: 82.7 fL (ref 78.0–100.0)
MONO ABS: 0.4 10*3/uL (ref 0.1–1.0)
MONOS PCT: 7 %
Neutro Abs: 2.6 10*3/uL (ref 1.7–7.7)
Neutrophils Relative %: 52 %
PLATELETS: 153 10*3/uL (ref 150–400)
RBC: 3.87 MIL/uL (ref 3.87–5.11)
RDW: 15.4 % (ref 11.5–15.5)
WBC: 5 10*3/uL (ref 4.0–10.5)

## 2016-09-03 LAB — URINALYSIS, ROUTINE W REFLEX MICROSCOPIC
BILIRUBIN URINE: NEGATIVE
Bacteria, UA: NONE SEEN
GLUCOSE, UA: NEGATIVE mg/dL
HGB URINE DIPSTICK: NEGATIVE
Ketones, ur: NEGATIVE mg/dL
Leukocytes, UA: NEGATIVE
NITRITE: NEGATIVE
PH: 5 (ref 5.0–8.0)
Protein, ur: NEGATIVE mg/dL
SPECIFIC GRAVITY, URINE: 1.006 (ref 1.005–1.030)

## 2016-09-03 LAB — COMPREHENSIVE METABOLIC PANEL
ALBUMIN: 4 g/dL (ref 3.5–5.0)
ALK PHOS: 112 U/L (ref 38–126)
ALT: 11 U/L — AB (ref 14–54)
ANION GAP: 7 (ref 5–15)
AST: 23 U/L (ref 15–41)
BILIRUBIN TOTAL: 0.7 mg/dL (ref 0.3–1.2)
BUN: 9 mg/dL (ref 6–20)
CALCIUM: 9.4 mg/dL (ref 8.9–10.3)
CO2: 24 mmol/L (ref 22–32)
CREATININE: 1.06 mg/dL — AB (ref 0.44–1.00)
Chloride: 106 mmol/L (ref 101–111)
GFR calc non Af Amer: 54 mL/min — ABNORMAL LOW (ref >60)
GLUCOSE: 97 mg/dL (ref 65–99)
Potassium: 3.3 mmol/L — ABNORMAL LOW (ref 3.5–5.1)
Sodium: 137 mmol/L (ref 135–145)
TOTAL PROTEIN: 7 g/dL (ref 6.5–8.1)

## 2016-09-03 LAB — LIPASE, BLOOD: LIPASE: 24 U/L (ref 11–51)

## 2016-09-03 MED ORDER — FAMOTIDINE 20 MG PO TABS: 20.0000 mg | ORAL_TABLET | Freq: Two times a day (BID) | ORAL | 0 refills | Status: DC

## 2016-09-03 MED ORDER — ONDANSETRON 8 MG PO TBDP
8.0000 mg | ORAL_TABLET | Freq: Once | ORAL | Status: AC
  Administered 2016-09-03: 8 mg via ORAL
  Filled 2016-09-03: qty 1

## 2016-09-03 NOTE — ED Notes (Signed)
Pt given sprite to drink.

## 2016-09-03 NOTE — ED Provider Notes (Signed)
[facility] DEPT Provider Note   CSN: [id number] Arrival date & time: 09/03/16  0231     History   Chief Complaint Chief Complaint  Patient presents with  . Abdominal Pain    HPI Jessica [name] is a 66 y.o. female.  The history is provided by the patient.  Abdominal Pain   This is a chronic problem. The current episode started more than 1 week ago. The problem occurs daily. The problem has been gradually worsening. The pain is located in the epigastric region. The pain is moderate. Associated symptoms include diarrhea, nausea and vomiting. Pertinent negatives include fever, hematochezia and dysuria. The symptoms are aggravated by certain positions and palpation. Nothing relieves the symptoms. Her past medical history is significant for Crohn's disease.  Patient with h/o Crohn's disease presents with epigastric pain for 2 months  She reports the pain is constant 24 hrs/day She is now having vomiting She reports unchanged nonbloody diarrhea No fever She reports brief chest pain recently, none at this time She reports when she gets UTI she can have upper abdominal pain   She reports she just went to urgent care due to insurance reasons and was told she had reflux and given zofran and also "liquid medicine" which she has vomited She reports since she went to urgent care her insurance will now allow her to go to the ER  She has tried calling her GI specialist (Rehman) but was not home when they called back.  She prefers not to f/u with her PCP   She thinks naproxen may be contributing to these issues   She thinks she may have AAA because she has family who have had this issue  Past Medical History:  Diagnosis Date  . Anxiety attack   . Arthritis   . Back pain, chronic   . Crohn's colitis ([place])   . Depression (emotion)   . Dysrhythmia   . GERD (gastroesophageal reflux disease)   . Hypertension   . Hypothyroid   . Neuropathy ([place])   . Sleep apnea    cpap is not  working per patient - needs to find a new doctor - not used since 7/16 per pat   . Vertigo    followed by Dr Wardell Heath- in [facility]     Patient Active Problem List   Diagnosis Date Noted  . Painful orthopaedic hardware ([place]) 08/07/2016  . Closed fracture of lateral portion of right tibial plateau 02/29/2016  . Tibial plateau fracture 02/29/2016  . Acute medial meniscal tear 03/28/2015  . Crohn's colitis ([place]) 02/10/2013  . Unspecified hypothyroidism 02/10/2013  . Neuropathy ([place]) 02/10/2013    Past Surgical History:  Procedure Laterality Date  . ABDOMINAL HYSTERECTOMY    . CHOLECYSTECTOMY    . COLON SURGERY     for Crohn's in the 52s in [place]  . COLONOSCOPY N/A 03/11/2013   Procedure: COLONOSCOPY;  Surgeon: Rogene Houston, MD;  [facility];  Service: Endoscopy;  Laterality: N/A;  1200  . fatty tumor removed    . HARDWARE REMOVAL Right 08/07/2016   Procedure: HARDWARE REMOVAL;  Surgeon: Gaynelle Arabian, MD;  Location: [facility];  Service: Orthopedics;  Laterality: Right;  . Hip replacement      Rt hip in 2010 in [place]  . KNEE ARTHROSCOPY Left 03/29/2015   Procedure: ARTHROSCOPY LEFT KNEE WITH MENSICAL DEBRIDEMENT, chondroplasty;  Surgeon: Gaynelle Arabian, MD;  Location: [facility];  Service: Orthopedics;  Laterality: Left;  . ORIF TIBIA PLATEAU Right  02/29/2016   Procedure: OPEN REDUCTION INTERNAL FIXATION (ORIF RIGHT  TIBIAL PLATEAU FRACTURE;  Surgeon: Gaynelle Arabian, MD;  Location: [facility];  Service: Orthopedics;  Laterality: Right;    OB History    No data available       Home Medications    Prior to Admission medications   Medication Sig Start Date End Date Taking? Authorizing Provider  ALPRAZolam (XANAX) 0.25 MG tablet Take 0.25 mg by mouth 2 (two) times daily as needed (for anxiety.).     Historical Provider, MD  aspirin EC 81 MG tablet Take 81 mg by mouth daily.    Historical Provider, MD  bisacodyl (DULCOLAX) 10 MG suppository Place 1 suppository (10 mg total)  rectally daily as needed for moderate constipation. Patient not taking: Reported on 07/29/2016 03/04/16   Alexzandrew L Dara Lords, PA-C  cetirizine (ZYRTEC) 10 MG tablet Take 10 mg by mouth daily as needed for allergies.     Historical Provider, MD  diphenoxylate-atropine (LOMOTIL) 2.5-0.025 MG per tablet Take 1 tablet by mouth 3 (three) times daily as needed for diarrhea or loose stools. 09/13/14   Rogene Houston, MD  docusate sodium (COLACE) 100 MG capsule Take 1 capsule (100 mg total) by mouth 2 (two) times daily. Patient not taking: Reported on 07/29/2016 03/04/16   Alexzandrew L Dara Lords, PA-C  fluticasone (FLONASE) 50 MCG/ACT nasal spray Place 2 sprays into the nose daily as needed for allergies.     Historical Provider, MD  gabapentin (NEURONTIN) 800 MG tablet Take 800 mg by mouth 4 (four) times daily. 06/21/16   Historical Provider, MD  levothyroxine (SYNTHROID, LEVOTHROID) 100 MCG tablet Take 100 mcg by mouth daily before breakfast.  10/25/15   Historical Provider, MD  lisinopril (PRINIVIL,ZESTRIL) 20 MG tablet Take 20 mg by mouth daily.    Historical Provider, MD  methocarbamol (ROBAXIN) 500 MG tablet Take 1 tablet (500 mg total) by mouth every 6 (six) hours as needed for muscle spasms. 08/08/16   Alexzandrew L Perkins, PA-C  metoCLOPramide (REGLAN) 5 MG tablet Take 1 tablet (5 mg total) by mouth every 8 (eight) hours as needed for nausea (if ondansetron (ZOFRAN) ineffective.). 03/04/16   Alexzandrew L Perkins, PA-C  Multiple Vitamin (MULTIVITAMIN WITH MINERALS) TABS tablet Take 1 tablet by mouth daily.    Historical Provider, MD  omeprazole (PRILOSEC) 20 MG capsule Take 20 mg by mouth 2 (two) times daily.     Historical Provider, MD  ondansetron (ZOFRAN) 4 MG tablet Take 1 tablet (4 mg total) by mouth every 6 (six) hours as needed for nausea. Patient not taking: Reported on 07/29/2016 03/04/16   Alexzandrew L Perkins, PA-C  oxyCODONE (OXY IR/ROXICODONE) 5 MG immediate release tablet Take 1-2 tablets  (5-10 mg total) by mouth every 4 (four) hours as needed for moderate pain or severe pain. 08/08/16   Alexzandrew L Perkins, PA-C  polyethylene glycol (MIRALAX / GLYCOLAX) packet Take 17 g by mouth daily as needed for mild constipation. Patient not taking: Reported on 07/29/2016 03/04/16   Alexzandrew L Dara Lords, PA-C  promethazine (PHENERGAN) 25 MG tablet Take 25 mg by mouth daily as needed for nausea or vomiting.  02/16/16   Historical Provider, MD  sertraline (ZOLOFT) 100 MG tablet Take 1 tablet (100 mg total) by mouth at bedtime. Patient taking differently: Take 100 mg by mouth daily.  04/24/16   Cloria Spring, MD  sodium phosphate (FLEET) 7-19 GM/118ML ENEM Place 133 mLs (1 enema total) rectally once as needed for severe constipation.  Patient not taking: Reported on 07/29/2016 03/04/16   Alexzandrew L Dara Lords, PA-C  triamterene-hydrochlorothiazide (MAXZIDE-25) 37.5-25 MG tablet Take 1 tablet by mouth daily. 02/13/16   Historical Provider, MD    Family History Family History  Problem Relation Age of Onset  . Crohn's disease Brother   . Depression Mother   . Anxiety disorder Mother   . Depression Sister   . Aneurysm Father   . Colon cancer Neg Hx     Social History Social History  Substance Use Topics  . Smoking status: Never Smoker  . Smokeless tobacco: Never Used  . Alcohol use No     Allergies   Morphine and related; Robaxin [methocarbamol]; and Sulfa antibiotics   Review of Systems Review of Systems  Constitutional: Negative for fever.  Gastrointestinal: Positive for abdominal pain, diarrhea, nausea and vomiting. Negative for blood in stool and hematochezia.  Genitourinary: Negative for dysuria.  All other systems reviewed and are negative.    Physical Exam Updated Vital Signs BP 151/89 (BP Location: Left Arm)   Pulse 86   Temp 98.5 F (36.9 C) (Oral)   Resp 16   Ht 5' 2"  (1.575 m)   Wt 73.5 kg   SpO2 97%   BMI 29.63 kg/m   Physical Exam CONSTITUTIONAL: Well  developed/well nourished HEAD: Normocephalic/atraumatic EYES: EOMI/PERRL, no icterus ENMT: Mucous membranes moist NECK: supple no meningeal signs SPINE/BACK:entire spine nontender CV: S1/S2 noted, no murmurs/rubs/gallops noted LUNGS: Lungs are clear to auscultation bilaterally, no apparent distress ABDOMEN: soft, nontender, no rebound or guarding, bowel sounds noted throughout abdomen.  No pulsatile mass GU:no cva tenderness NEURO: Pt is awake/alert/appropriate, moves all extremitiesx4.  No facial droop.   EXTREMITIES: pulses normal/equal, full ROM SKIN: warm, color normal PSYCH: no abnormalities of mood noted, alert and oriented to situation   ED Treatments / Results  Labs (all labs ordered are listed, but only abnormal results are displayed) Labs Reviewed  COMPREHENSIVE METABOLIC PANEL - Abnormal; Notable for the following:       Result Value   Potassium 3.3 (*)    Creatinine, Ser 1.06 (*)    ALT 11 (*)    GFR calc non Af Amer 54 (*)    All other components within normal limits  CBC WITH DIFFERENTIAL/PLATELET - Abnormal; Notable for the following:    Hemoglobin 10.9 (*)    HCT 32.0 (*)    All other components within normal limits  URINALYSIS, ROUTINE W REFLEX MICROSCOPIC - Abnormal; Notable for the following:    Color, Urine STRAW (*)    All other components within normal limits  LIPASE, BLOOD    EKG  EKG Interpretation  Date/Time:  Tuesday September 03 2016 03:11:42 EST Ventricular Rate:  80 PR Interval:    QRS Duration: 95 QT Interval:  374 QTC Calculation: 432 R Axis:   110 Text Interpretation:  Sinus rhythm Right axis deviation Low voltage, extremity and precordial leads No significant change since last tracing Confirmed by Christy Gentles  MD, Arush Gatliff ([id number]) on 09/03/2016 3:32:29 AM       Radiology No results found.  Procedures Procedures (including critical care time)  Medications Ordered in ED Medications  ondansetron (ZOFRAN-ODT) disintegrating tablet 8 mg  (8 mg Oral Given 09/03/16 0307)     Initial Impression / Assessment and Plan / ED Course  I have reviewed the triage vital signs and the nursing notes.  Pertinent labs  results that were available during my care of the patient were reviewed by me and  considered in my medical decision making (see chart for details).     3:39 AM Pt presents with chronic/daily epigastric pain for 2 months She is well appearing Advised we will start with labs At this point, I don't feel emergent imaging warranted Will need close GI followup and may need endoscopy  Advised to STOP naproxen I doubt crohn's flare at this time  Final Clinical Impressions(s) / ED Diagnoses   Final diagnoses:  Epigastric pain    New Prescriptions New Prescriptions   FAMOTIDINE (PEPCID) 20 MG TABLET    Take 1 tablet (20 mg total) by mouth 2 (two) times daily.     Ripley Fraise, MD 09/03/16 [phone]

## 2016-09-03 NOTE — Discharge Instructions (Signed)
°  SEEK IMMEDIATE MEDICAL ATTENTION IF: The pain becomes severe, particularly over the next 8-12 hours.  A temperature above 100.54F develops.  Repeated vomiting occurs (multiple episodes).  The pain becomes localized to portions of the abdomen. In an adult, the left lower portion of the abdomen could be colitis or diverticulitis.  Blood is being passed in stools or vomit (bright red or black tarry stools).  Return also if you develop chest pain, difficulty breathing, dizziness or fainting, or become confused, poorly responsive, or inconsolable.

## 2016-09-03 NOTE — ED Triage Notes (Signed)
Pt c/o epigastric pain x 3 months; pt states she went to urgent care earlier this evening and was told she has acid reflux

## 2016-09-30 ENCOUNTER — Encounter (INDEPENDENT_AMBULATORY_CARE_PROVIDER_SITE_OTHER): Payer: Self-pay | Admitting: Internal Medicine

## 2016-09-30 ENCOUNTER — Ambulatory Visit (INDEPENDENT_AMBULATORY_CARE_PROVIDER_SITE_OTHER): Payer: 59 | Admitting: Internal Medicine

## 2016-09-30 VITALS — BP 130/80 | HR 60 | Temp 97.6°F | Ht 62.0 in | Wt 151.7 lb

## 2016-09-30 DIAGNOSIS — K219 Gastro-esophageal reflux disease without esophagitis: Secondary | ICD-10-CM

## 2016-09-30 DIAGNOSIS — K5010 Crohn's disease of large intestine without complications: Secondary | ICD-10-CM

## 2016-09-30 LAB — CBC WITH DIFFERENTIAL/PLATELET
BASOS ABS: 0 {cells}/uL (ref 0–200)
Basophils Relative: 0 %
EOS ABS: 74 {cells}/uL (ref 15–500)
Eosinophils Relative: 1 %
HCT: 39.5 % (ref 35.0–45.0)
HEMOGLOBIN: 12.9 g/dL (ref 11.7–15.5)
Lymphocytes Relative: 30 %
Lymphs Abs: 2220 cells/uL (ref 850–3900)
MCH: 28.5 pg (ref 27.0–33.0)
MCHC: 32.7 g/dL (ref 32.0–36.0)
MCV: 87.4 fL (ref 80.0–100.0)
MONOS PCT: 7 %
MPV: 10.4 fL (ref 7.5–12.5)
Monocytes Absolute: 518 cells/uL (ref 200–950)
NEUTROS ABS: 4588 {cells}/uL (ref 1500–7800)
NEUTROS PCT: 62 %
Platelets: 269 10*3/uL (ref 140–400)
RBC: 4.52 MIL/uL (ref 3.80–5.10)
RDW: 16.4 % — AB (ref 11.0–15.0)
WBC: 7.4 10*3/uL (ref 3.8–10.8)

## 2016-09-30 MED ORDER — MESALAMINE ER 500 MG PO CPCR: 500.0000 mg | ORAL_CAPSULE | Freq: Four times a day (QID) | ORAL | 6 refills | Status: DC

## 2016-09-30 MED ORDER — PANTOPRAZOLE SODIUM 40 MG PO TBEC: 40.0000 mg | DELAYED_RELEASE_TABLET | Freq: Every day | ORAL | 5 refills | Status: DC

## 2016-09-30 NOTE — Progress Notes (Signed)
Subjective:    Patient ID: Jessica [name], female    DOB: Nov 12, 1950, 66 y.o.   MRN: [id number]  HPI Presents today with c/o acid reflux , burping and some epigastric pain. Symptoms for about a year. She has acid reflux but is not taking anything for this at this time. Her appetite is good. She has lost 50 pounds due to her fall she says. She thinks she was depressed. She has a BM 4-6 times a day. No melena or BRRB. Last seen 2 yrs ago.  Take baby ASA daily.  Hx of Crohn's disease. Presently not taking anything for her Crohn's. Has been off [place] for about a year.  Hx of possible rt hemicolectomy years ago.   Hx of fx rt tibia last year.    03/11/2013 Colonoscopy  Indications: Patient is 66 year-old Caucasian female with history of Crohn disease who presents with diarrhea. Her last colonoscopy was in August 2000 it. She is undergoing colonoscopy for diagnostic and surveillance purposes. She presently is on no therapy for IBD. 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 Biopsy taken. No evidence of colonic polyps or colitis.  Biopsy results reviewed with patient. She has active small bowel Crohn's. She should continue Pentasa 1 g by mouth 4 times a day.    EGD/Colonoscopy 2004: Chronic GERD, Crohn's disease:     1. Erosive reflux esophagitis except noncritical distal esophageal ring and  has a small sliding hiatal hernia. Esophagus was dilated both by passing  54-French Maloney dilator and also using 18-20 mm balloon.  2. Normal examination of the stomach for a second part of duodenum.  3. Single diverticulum at transverse colon with mild symptoms of  diverticulitis. This does not appear to be causing the symptoms;  nevertheless needs to be treated.  4. Erosions involving the distal small bowel proximal to the ileocolic  anastomosis.  5. Small external hemorrhoids.  6. Suspect her diarrhea is mainly due to bile effect on the  colon and  perhaps mild Crohn's disease.   Review of Systems Past Medical History:  Diagnosis Date  . Anxiety attack   . Arthritis   . Back pain, chronic   . Crohn's colitis ([place])   . Depression (emotion)   . Dysrhythmia   . GERD (gastroesophageal reflux disease)   . Hypertension   . Hypothyroid   . Neuropathy ([place])   . Sleep apnea    cpap is not working per patient - needs to find a new doctor - not used since 7/16 per pat   . Vertigo    followed by Dr Wardell Heath- in [place]     Past Surgical History:  Procedure Laterality Date  . ABDOMINAL HYSTERECTOMY    . CHOLECYSTECTOMY    . COLON SURGERY     for Crohn's in the 44s in [place]  . COLONOSCOPY N/A 03/11/2013   Procedure: COLONOSCOPY;  Surgeon: Rogene Houston, MD;  [facility];  Service: Endoscopy;  Laterality: N/A;  1200  . fatty tumor removed    . HARDWARE REMOVAL Right 08/07/2016   Procedure: HARDWARE REMOVAL;  Surgeon: Gaynelle Arabian, MD;  Location: [facility];  Service: Orthopedics;  Laterality: Right;  . Hip replacement      Rt hip in 2010 in [place]  . KNEE ARTHROSCOPY Left 03/29/2015   Procedure: ARTHROSCOPY LEFT KNEE WITH MENSICAL DEBRIDEMENT, chondroplasty;  Surgeon: Gaynelle Arabian, MD;  Location: [facility];  Service: Orthopedics;  Laterality: Left;  .  ORIF TIBIA PLATEAU Right 02/29/2016   Procedure: OPEN REDUCTION INTERNAL FIXATION (ORIF RIGHT  TIBIAL PLATEAU FRACTURE;  Surgeon: Gaynelle Arabian, MD;  [facility];  Service: Orthopedics;  Laterality: Right;    Allergies  Allergen Reactions  . Morphine And Related Hives and Itching    May cause blood pressure to drop  . Robaxin [Methocarbamol] Itching    (PT WILL RESUME TAKING TO SEE IF THIS IS HER ALLERGY)  . Sulfa Antibiotics Itching    ALL SULFA DRUGS    Current Outpatient Prescriptions on File Prior to Visit  Medication Sig Dispense Refill  . aspirin EC 81 MG tablet Take 81 mg by mouth daily.    . bisacodyl (DULCOLAX) 10 MG suppository Place 1  suppository (10 mg total) rectally daily as needed for moderate constipation. (Patient not taking: Reported on 07/29/2016) 12 suppository 0  . cetirizine (ZYRTEC) 10 MG tablet Take 10 mg by mouth daily as needed for allergies.     . diphenoxylate-atropine (LOMOTIL) 2.5-0.025 MG per tablet Take 1 tablet by mouth 3 (three) times daily as needed for diarrhea or loose stools. 60 tablet 1  . docusate sodium (COLACE) 100 MG capsule Take 1 capsule (100 mg total) by mouth 2 (two) times daily. (Patient not taking: Reported on 07/29/2016) 10 capsule 0  . fluticasone (FLONASE) 50 MCG/ACT nasal spray Place 2 sprays into the nose daily as needed for allergies.     Marland Kitchen gabapentin (NEURONTIN) 800 MG tablet Take 800 mg by mouth 4 (four) times daily.    Marland Kitchen levothyroxine (SYNTHROID, LEVOTHROID) 100 MCG tablet Take 100 mcg by mouth daily before breakfast.     . lisinopril (PRINIVIL,ZESTRIL) 20 MG tablet Take 20 mg by mouth daily.    . metoCLOPramide (REGLAN) 5 MG tablet Take 1 tablet (5 mg total) by mouth every 8 (eight) hours as needed for nausea (if ondansetron (ZOFRAN) ineffective.). 40 tablet 0  . Multiple Vitamin (MULTIVITAMIN WITH MINERALS) TABS tablet Take 1 tablet by mouth daily.    Marland Kitchen oxyCODONE (OXY IR/ROXICODONE) 5 MG immediate release tablet Take 1-2 tablets (5-10 mg total) by mouth every 4 (four) hours as needed for moderate pain or severe pain. 56 tablet 0  . sertraline (ZOLOFT) 100 MG tablet Take 1 tablet (100 mg total) by mouth at bedtime. (Patient taking differently: Take 100 mg by mouth daily. ) 90 tablet 2  . sodium phosphate (FLEET) 7-19 GM/118ML ENEM Place 133 mLs (1 enema total) rectally once as needed for severe constipation. (Patient not taking: Reported on 07/29/2016) 2 Bottle 0  . triamterene-hydrochlorothiazide (MAXZIDE-25) 37.5-25 MG tablet Take 1 tablet by mouth daily.     No current facility-administered medications on file prior to visit.        Objective:   Physical Exam Blood pressure  130/80, pulse 60, temperature 97.6 F (36.4 C), height 5' 2"  (1.575 m), weight 151 lb 11.2 oz (68.8 kg). Alert and oriented. Skin warm and dry. Oral mucosa is moist.   . Sclera anicteric, conjunctivae is pink. Thyroid not enlarged. No cervical lymphadenopathy. Lungs clear. Heart regular rate and rhythm.  Abdomen is soft. Bowel sounds are positive. No hepatomegaly. No abdominal masses felt. No tenderness.  No edema to lower extremities.         Assessment & Plan:  GERD. Rx for Protonix 62m daily. Crohn's disease: Pentasa 5061mQID. OV 4 months. CBC and CRP.

## 2016-09-30 NOTE — Patient Instructions (Signed)
CBC and CRP.

## 2016-10-01 LAB — C-REACTIVE PROTEIN: CRP: 0.6 mg/L (ref <8.0)

## 2016-12-26 ENCOUNTER — Ambulatory Visit (INDEPENDENT_AMBULATORY_CARE_PROVIDER_SITE_OTHER): Payer: 59 | Admitting: Podiatry

## 2016-12-26 ENCOUNTER — Encounter: Payer: Self-pay | Admitting: Podiatry

## 2016-12-26 ENCOUNTER — Ambulatory Visit (INDEPENDENT_AMBULATORY_CARE_PROVIDER_SITE_OTHER): Payer: 59

## 2016-12-26 VITALS — BP 149/89 | HR 74 | Resp 16

## 2016-12-26 DIAGNOSIS — M722 Plantar fascial fibromatosis: Secondary | ICD-10-CM | POA: Diagnosis not present

## 2016-12-26 MED ORDER — METHYLPREDNISOLONE 4 MG PO TBPK: ORAL_TABLET | ORAL | 0 refills | Status: DC

## 2016-12-26 MED ORDER — MELOXICAM 15 MG PO TABS: 15.0000 mg | ORAL_TABLET | Freq: Every day | ORAL | 3 refills | Status: DC

## 2016-12-26 NOTE — Progress Notes (Signed)
   Subjective:    Patient ID: Jessica [name], female    DOB: [birth year], 66 y.o.   MRN: [id number]  HPI: She presents today she complained of plantar posterior heel pain right. She sits and aching for about 2 weeks when to urgent care and diagnosed with a heel spur. States that they prescribed her naproxen and diclofenac gel no help at all. Tried husbands oral diclofenac which seemed to help.    Review of Systems  HENT: Positive for hearing loss.   Musculoskeletal: Positive for gait problem.  All other systems reviewed and are negative.      Objective:   Physical Exam: Vital signs are stable alert and oriented 3. Pulses are palpable. Neurologic sensorium is intact. Deep tendon reflexes are intact. Muscle strength is 5 over 5 dorsiflexion plantar flexors and inverters evert on physical musculatures intact. Orthopedic evaluation demonstrates pain on palpation medially continue to go the right heel. Similarly on the left heel. Radiographs do demonstrate soft tissue increase in density plantar fascia calcaneal insertion sites bilaterally.        Assessment & Plan:  Plantar fasciitis bilateral.  Plan: Injected the bilateral heels today with Kenalog and local anesthetic surgical Medrol Dosepak. Plan for braces at night splints are dispensed. Discussed proper shoe gear stretching exercises ice therapy and shoe modifications. Dispensed stretching exercises for her today will follow up with her in 1 month.

## 2016-12-26 NOTE — Patient Instructions (Signed)

## 2017-01-25 ENCOUNTER — Other Ambulatory Visit (HOSPITAL_COMMUNITY): Payer: Self-pay | Admitting: Psychiatry

## 2017-01-28 ENCOUNTER — Ambulatory Visit: Payer: 59 | Admitting: Podiatry

## 2017-01-29 ENCOUNTER — Ambulatory Visit (INDEPENDENT_AMBULATORY_CARE_PROVIDER_SITE_OTHER): Payer: Self-pay | Admitting: Internal Medicine

## 2017-01-30 ENCOUNTER — Ambulatory Visit (INDEPENDENT_AMBULATORY_CARE_PROVIDER_SITE_OTHER): Payer: Self-pay | Admitting: Internal Medicine

## 2017-04-03 ENCOUNTER — Telehealth: Payer: Self-pay

## 2017-04-03 ENCOUNTER — Telehealth (HOSPITAL_COMMUNITY): Payer: Self-pay | Admitting: *Deleted

## 2017-04-03 NOTE — Telephone Encounter (Signed)
appt is Monday, she will have to get it then

## 2017-04-03 NOTE — Telephone Encounter (Signed)
noted 

## 2017-04-03 NOTE — Telephone Encounter (Signed)
returned phone call to patient regarding an appointment no answer, left voice message.

## 2017-04-03 NOTE — Telephone Encounter (Signed)
I have sent a request to [facility]

## 2017-04-03 NOTE — Telephone Encounter (Signed)
Jessica [name], can you send a request to the offsite facility and see if this patient has a chart.

## 2017-04-03 NOTE — Telephone Encounter (Signed)
Pt called this morning to see about finding a surgery report from 20 years ago that we had in her paper chart. It is from [place]. I told her to call [place] to try and get the report but she does not remember the doctors name or hospital name. Can we help her? Please advise

## 2017-04-03 NOTE — Telephone Encounter (Signed)
Pt called stating she would like to know if she could get refills for her Zoloft 100 mg. Per pt chart, pt had not seen provider since 04-24-2016. Per pt chart, pt medication was last refilled on 04-24-2016 with 90 tabs 2 refills. Staff scheduled pt to see provider on 04-07-2017. Pt phone number is [phone].

## 2017-04-03 NOTE — Telephone Encounter (Signed)
Called pt to inform her with what provider stated and was unable to reach pt at the number she provided during her previous message. Staff lmtcb and office number was provided.

## 2017-04-04 ENCOUNTER — Telehealth: Payer: Self-pay | Admitting: General Practice

## 2017-04-04 NOTE — Telephone Encounter (Signed)
[facility] sent me the records patient was inquiring about earlier last week. She will need to sign a release of records and the medical records are up front in the sample drawer.

## 2017-04-06 ENCOUNTER — Other Ambulatory Visit (HOSPITAL_COMMUNITY): Payer: Self-pay | Admitting: Psychiatry

## 2017-04-07 ENCOUNTER — Other Ambulatory Visit (HOSPITAL_COMMUNITY): Payer: Self-pay | Admitting: Psychiatry

## 2017-04-07 ENCOUNTER — Telehealth (HOSPITAL_COMMUNITY): Payer: Self-pay | Admitting: *Deleted

## 2017-04-07 ENCOUNTER — Ambulatory Visit (HOSPITAL_COMMUNITY): Payer: Self-pay | Admitting: Psychiatry

## 2017-04-07 DIAGNOSIS — F331 Major depressive disorder, recurrent, moderate: Secondary | ICD-10-CM

## 2017-04-07 MED ORDER — SERTRALINE HCL 100 MG PO TABS: 100.0000 mg | ORAL_TABLET | Freq: Every day | ORAL | 0 refills | Status: DC

## 2017-04-07 NOTE — Telephone Encounter (Signed)
Phone call to patient to reschedule appointment due to doctor out of office.   patient said she will not have enough medicine, she has seven days left.   She said she is going out of town tomorrow.

## 2017-04-07 NOTE — Telephone Encounter (Signed)
Met with Dr. Lovena Le, helping to cover for Dr. Harrington Challenger out today sick as he provided a one time 30 day refill order for patient's prescribed Zoloft 128m, one a day, #30 with no refills until patient returns for rescheduled evaluation set for 04/21/17. Called patient to inform Dr. TLovena Leapproved a one time refill of her Zoloft for 30 days supply since they had to reschedule her appointment today but encouraged her to keep new appointment now set for 04/21/17 with Dr. RHarrington Challengerfor any further refills and patient stated agreement with this plan.

## 2017-04-07 NOTE — Telephone Encounter (Signed)
Jessica [name], please call the patient and let her know

## 2017-04-08 NOTE — Telephone Encounter (Signed)
LMOM for patient that the records are here for her to pick and she will need to sign a release of records.

## 2017-04-21 ENCOUNTER — Ambulatory Visit (HOSPITAL_COMMUNITY): Payer: Self-pay | Admitting: Psychiatry

## 2017-06-25 ENCOUNTER — Other Ambulatory Visit (HOSPITAL_COMMUNITY): Payer: Self-pay | Admitting: Psychiatry

## 2017-07-22 ENCOUNTER — Ambulatory Visit: Payer: Self-pay | Admitting: Orthopedic Surgery

## 2017-07-23 DIAGNOSIS — M25512 Pain in left shoulder: Secondary | ICD-10-CM | POA: Diagnosis not present

## 2017-07-23 DIAGNOSIS — M25511 Pain in right shoulder: Secondary | ICD-10-CM | POA: Diagnosis not present

## 2017-07-28 DIAGNOSIS — Z6830 Body mass index (BMI) 30.0-30.9, adult: Secondary | ICD-10-CM | POA: Diagnosis not present

## 2017-07-28 DIAGNOSIS — J329 Chronic sinusitis, unspecified: Secondary | ICD-10-CM | POA: Diagnosis not present

## 2017-07-28 DIAGNOSIS — Z299 Encounter for prophylactic measures, unspecified: Secondary | ICD-10-CM | POA: Diagnosis not present

## 2017-07-28 DIAGNOSIS — Z713 Dietary counseling and surveillance: Secondary | ICD-10-CM | POA: Diagnosis not present

## 2017-07-28 DIAGNOSIS — Z789 Other specified health status: Secondary | ICD-10-CM | POA: Diagnosis not present

## 2017-08-06 ENCOUNTER — Encounter (HOSPITAL_COMMUNITY): Payer: Self-pay | Admitting: Psychiatry

## 2017-08-06 ENCOUNTER — Ambulatory Visit (INDEPENDENT_AMBULATORY_CARE_PROVIDER_SITE_OTHER): Payer: Medicare Other | Admitting: Psychiatry

## 2017-08-06 DIAGNOSIS — M2550 Pain in unspecified joint: Secondary | ICD-10-CM

## 2017-08-06 DIAGNOSIS — F331 Major depressive disorder, recurrent, moderate: Secondary | ICD-10-CM | POA: Diagnosis not present

## 2017-08-06 DIAGNOSIS — M549 Dorsalgia, unspecified: Secondary | ICD-10-CM | POA: Diagnosis not present

## 2017-08-06 DIAGNOSIS — F419 Anxiety disorder, unspecified: Secondary | ICD-10-CM | POA: Diagnosis not present

## 2017-08-06 DIAGNOSIS — Z818 Family history of other mental and behavioral disorders: Secondary | ICD-10-CM | POA: Diagnosis not present

## 2017-08-06 MED ORDER — SERTRALINE HCL 100 MG PO TABS: 100.0000 mg | ORAL_TABLET | Freq: Every day | ORAL | 3 refills | Status: DC

## 2017-08-06 NOTE — Progress Notes (Signed)
[facility] MD/PA/NP OP Progress Note  08/06/2017 9:29 AM Jessica [name]  MRN:  [id number]  Chief Complaint:  Chief Complaint    Depression; Anxiety; Follow-up     HPI: This patient is a 67 year old married white female who lives with her husband in [place].  She has 2 grown sons.  She retired as a Engineer, structural about 2 years ago.  The patient returns after a long absence.  She was last seen in October 2017.  She claims she has not come back because "I was feeling just fine and did not think I needed to return."  She asked her primary care provider to renew her Zoloft but they were unwilling to do so.  Since the patient quit her job she has been under a lot less stress.  She has been doing much better.  She fell and broke her right leg in August 2017 and has difficulty ever since.  She is now slated to have a right knee replacement on August 25, 2017.  She does not always sleep well because of the pain but other than that her mood is good she denies panic attacks or significant anxiety or any thoughts of suicide or self-harm.  She thinks her energy will improve once she gets past the knee replacement.  She and her husband are getting along well and they have made several trips to visit family during the holiday season. Visit Diagnosis:    ICD-10-CM   1. Major depressive disorder, recurrent episode, moderate (HCC) F33.1 sertraline (ZOLOFT) 100 MG tablet    Past Psychiatric History: None  Past Medical History:  Past Medical History:  Diagnosis Date  . Anxiety attack   . Arthritis   . Back pain, chronic   . Crohn's colitis ([place])   . Depression (emotion)   . Dysrhythmia   . GERD (gastroesophageal reflux disease)   . Hypertension   . Hypothyroid   . Neuropathy   . Sleep apnea    cpap is not working per patient - needs to find a new doctor - not used since 7/16 per pat   . Vertigo    followed by Dr Wardell Heath- in [place]     Past Surgical History:  Procedure Laterality Date  . ABDOMINAL  HYSTERECTOMY    . CHOLECYSTECTOMY    . COLON SURGERY     for Crohn's in the 49s in [place]  . COLONOSCOPY N/A 03/11/2013   Procedure: COLONOSCOPY;  Surgeon: Rogene Houston, MD;  Location: [facility];  Service: Endoscopy;  Laterality: N/A;  1200  . fatty tumor removed    . HARDWARE REMOVAL Right 08/07/2016   Procedure: HARDWARE REMOVAL;  Surgeon: Gaynelle Arabian, MD;  Location: [facility];  Service: Orthopedics;  Laterality: Right;  . Hip replacement      Rt hip in 2010 in [place]  . KNEE ARTHROSCOPY Left 03/29/2015   Procedure: ARTHROSCOPY LEFT KNEE WITH MENSICAL DEBRIDEMENT, chondroplasty;  Surgeon: Gaynelle Arabian, MD;  Location: [facility];  Service: Orthopedics;  Laterality: Left;  . ORIF TIBIA PLATEAU Right 02/29/2016   Procedure: OPEN REDUCTION INTERNAL FIXATION (ORIF RIGHT  TIBIAL PLATEAU FRACTURE;  Surgeon: Gaynelle Arabian, MD;  Location: [facility];  Service: Orthopedics;  Laterality: Right;    Family Psychiatric History: See below  Family History:  Family History  Problem Relation Age of Onset  . Crohn's disease Brother   . Depression Mother   . Anxiety disorder Mother   . Depression Sister   . Aneurysm Father   .  Colon cancer Neg Hx     Social History:  Social History   Socioeconomic History  . Marital status: Married    Spouse name: None  . Number of children: None  . Years of education: None  . Highest education level: None  Social Needs  . Financial resource strain: None  . Food insecurity - worry: None  . Food insecurity - inability: None  . Transportation needs - medical: None  . Transportation needs - non-medical: None  Occupational History  . None  Tobacco Use  . Smoking status: Never Smoker  . Smokeless tobacco: Never Used  Substance and Sexual Activity  . Alcohol use: No    Alcohol/week: 0.0 oz  . Drug use: No  . Sexual activity: Not Currently  Other Topics Concern  . None  Social History Narrative  . None    Allergies:  Allergies  Allergen  Reactions  . Morphine And Related Hives and Itching    May cause blood pressure to drop  . Robaxin [Methocarbamol] Itching    (PT WILL RESUME TAKING TO SEE IF THIS IS HER ALLERGY)  . Sulfa Antibiotics Itching    ALL SULFA DRUGS    Metabolic Disorder Labs: No results found for: HGBA1C, MPG No results found for: PROLACTIN No results found for: CHOL, TRIG, HDL, CHOLHDL, VLDL, LDLCALC No results found for: TSH  Therapeutic Level Labs: No results found for: LITHIUM No results found for: VALPROATE No components found for:  CBMZ  Current Medications: Current Outpatient Medications  Medication Sig Dispense Refill  . aspirin EC 81 MG tablet Take 81 mg by mouth daily.    . bisacodyl (DULCOLAX) 10 MG suppository Place 1 suppository (10 mg total) rectally daily as needed for moderate constipation. 12 suppository 0  . cetirizine (ZYRTEC) 10 MG tablet Take 10 mg by mouth daily as needed for allergies.     . diphenoxylate-atropine (LOMOTIL) 2.5-0.025 MG per tablet Take 1 tablet by mouth 3 (three) times daily as needed for diarrhea or loose stools. 60 tablet 1  . docusate sodium (COLACE) 100 MG capsule Take 1 capsule (100 mg total) by mouth 2 (two) times daily. (Patient taking differently: Take 100 mg by mouth daily as needed. ) 10 capsule 0  . fluticasone (FLONASE) 50 MCG/ACT nasal spray Place 2 sprays into the nose daily as needed for allergies.     Marland Kitchen gabapentin (NEURONTIN) 800 MG tablet Take 800 mg by mouth 4 (four) times daily.    Marland Kitchen HYDROcodone-acetaminophen (NORCO/VICODIN) 5-325 MG tablet     . ibandronate (BONIVA) 150 MG tablet     . levothyroxine (SYNTHROID, LEVOTHROID) 88 MCG tablet     . lisinopril (PRINIVIL,ZESTRIL) 20 MG tablet Take 20 mg by mouth daily.    . meloxicam (MOBIC) 15 MG tablet Take 1 tablet (15 mg total) by mouth daily. 30 tablet 3  . mesalamine (PENTASA) 500 MG CR capsule Take 1 capsule (500 mg total) by mouth 4 (four) times daily. 120 capsule 6  . methylPREDNISolone  (MEDROL DOSEPAK) 4 MG TBPK tablet 6 day dose pack - take as directed 21 tablet 0  . metoCLOPramide (REGLAN) 5 MG tablet Take 1 tablet (5 mg total) by mouth every 8 (eight) hours as needed for nausea (if ondansetron (ZOFRAN) ineffective.). 40 tablet 0  . Multiple Vitamin (MULTIVITAMIN WITH MINERALS) TABS tablet Take 1 tablet by mouth daily.    . naproxen (NAPROSYN) 500 MG tablet     . pantoprazole (PROTONIX) 40 MG tablet Take 1  tablet (40 mg total) by mouth daily. 30 tablet 5  . sertraline (ZOLOFT) 100 MG tablet Take 1 tablet (100 mg total) by mouth at bedtime. 90 tablet 3  . sodium phosphate (FLEET) 7-19 GM/118ML ENEM Place 133 mLs (1 enema total) rectally once as needed for severe constipation. 2 Bottle 0  . triamterene-hydrochlorothiazide (MAXZIDE-25) 37.5-25 MG tablet Take 1 tablet by mouth daily.    . Vitamin D, Ergocalciferol, (DRISDOL) 50000 units CAPS capsule     . VOLTAREN 1 % GEL     . XIIDRA 5 % SOLN      No current facility-administered medications for this visit.      Musculoskeletal: Strength & Muscle Tone: decreased Gait & Station: unsteady Patient leans: N/A  Psychiatric Specialty Exam: Review of Systems  Musculoskeletal: Positive for back pain and joint pain.  All other systems reviewed and are negative.   Blood pressure 119/78, pulse 82, height 5' 2"  (1.575 m), weight 164 lb (74.4 kg), SpO2 98 %.Body mass index is 30 kg/m.  General Appearance: Casual, Neat and Well Groomed  Eye Contact:  Good  Speech:  Clear and Coherent  Volume:  Normal  Mood:  Euthymic  Affect:  Congruent  Thought Process:  Goal Directed  Orientation:  Full (Time, Place, and Person)  Thought Content: WDL   Suicidal Thoughts:  No  Homicidal Thoughts:  No  Memory:  Immediate;   Good Recent;   Good Remote;   Good  Judgement:  Fair  Insight:  Fair  Psychomotor Activity:  Normal  Concentration:  Concentration: Good and Attention Span: Good  Recall:  Good  Fund of Knowledge: Good   Language: Good  Akathisia:  No  Handed:  Right  AIMS (if indicated): not done  Assets:  Communication Skills Desire for Improvement Resilience Social Support Talents/Skills  ADL's:  Intact  Cognition: WNL  Sleep:  Fair   Screenings:   Assessment and Plan: This patient is a 67 year old female with a history of depression and anxiety.  She is doing well on Zoloft 100 mg daily and will continue this dosage.  She no longer uses any Xanax.  She states that she does not feel she needs to come very often so I suggested we meet again in 6 months or she may call at any time if she needs to come in sooner.   [name], MD 08/06/2017, 9:29 AM

## 2017-08-07 DIAGNOSIS — M1711 Unilateral primary osteoarthritis, right knee: Secondary | ICD-10-CM | POA: Diagnosis not present

## 2017-08-18 DIAGNOSIS — M7541 Impingement syndrome of right shoulder: Secondary | ICD-10-CM | POA: Diagnosis not present

## 2017-08-18 DIAGNOSIS — M7542 Impingement syndrome of left shoulder: Secondary | ICD-10-CM | POA: Diagnosis not present

## 2017-08-19 NOTE — Progress Notes (Signed)
06-18-17 Surgical clearance from Dr. [name] on chart  09-03-16 (Epic) EKG

## 2017-08-19 NOTE — Patient Instructions (Signed)
Jessica [name]  08/19/2017   Your procedure is scheduled on: 08-25-17  Report to [facility] Main  Entrance Report to Admitting at 9:05 AM   Call this number if you have problems the morning of surgery [phone]   Remember: Do not eat food or drink liquids :After Midnight.     Take these medicines the morning of surgery with A SIP OF WATER: Gabapentin (Neurontin), Levothyroxine (Synthroid) and Loratadine (Claritin). You may also bring and use your eyedrops and nasal spray as needed.                                You may not have any metal on your body including hair pins and              piercings  Do not wear jewelry, make-up, lotions, powders or perfumes, deodorant             Do not wear nail polish.  Do not shave  48 hours prior to surgery.               Do not bring valuables to the hospital. [place].  Contacts, dentures or bridgework may not be worn into surgery.  Leave suitcase in the car. After surgery it may be brought to your room.              Please read over the following fact sheets you were given: _____________________________________________________________________         [facility] - Preparing for Surgery Before surgery, you can play an important role.  Because skin is not sterile, your skin needs to be as free of germs as possible.  You can reduce the number of germs on your skin by washing with CHG (chlorahexidine gluconate) soap before surgery.  CHG is an antiseptic cleaner which kills germs and bonds with the skin to continue killing germs even after washing. Please DO NOT use if you have an allergy to CHG or antibacterial soaps.  If your skin becomes reddened/irritated stop using the CHG and inform your nurse when you arrive at Short Stay. Do not shave (including legs and underarms) for at least 48 hours prior to the first CHG shower.  You may shave your face/neck. Please follow these  instructions carefully:  1.  Shower with CHG Soap the night before surgery and the  morning of Surgery.  2.  If you choose to wash your hair, wash your hair first as usual with your  normal  shampoo.  3.  After you shampoo, rinse your hair and body thoroughly to remove the  shampoo.                           4.  Use CHG as you would any other liquid soap.  You can apply chg directly  to the skin and wash                       Gently with a scrungie or clean washcloth.  5.  Apply the CHG Soap to your body ONLY FROM THE NECK DOWN.   Do not use on face/ open  Wound or open sores. Avoid contact with eyes, ears mouth and genitals (private parts).                       Wash face,  Genitals (private parts) with your normal soap.             6.  Wash thoroughly, paying special attention to the area where your surgery  will be performed.  7.  Thoroughly rinse your body with warm water from the neck down.  8.  DO NOT shower/wash with your normal soap after using and rinsing off  the CHG Soap.                9.  Pat yourself dry with a clean towel.            10.  Wear clean pajamas.            11.  Place clean sheets on your bed the night of your first shower and do not  sleep with pets. Day of Surgery : Do not apply any lotions/deodorants the morning of surgery.  Please wear clean clothes to the hospital/surgery center.  FAILURE TO FOLLOW THESE INSTRUCTIONS MAY RESULT IN THE CANCELLATION OF YOUR SURGERY PATIENT SIGNATURE_________________________________  NURSE SIGNATURE__________________________________  ________________________________________________________________________   Adam Phenix  An incentive spirometer is a tool that can help keep your lungs clear and active. This tool measures how well you are filling your lungs with each breath. Taking long deep breaths may help reverse or decrease the chance of developing breathing (pulmonary) problems (especially  infection) following:  A long period of time when you are unable to move or be active. BEFORE THE PROCEDURE   If the spirometer includes an indicator to show your best effort, your nurse or respiratory therapist will set it to a desired goal.  If possible, sit up straight or lean slightly forward. Try not to slouch.  Hold the incentive spirometer in an upright position. INSTRUCTIONS FOR USE  1. Sit on the edge of your bed if possible, or sit up as far as you can in bed or on a chair. 2. Hold the incentive spirometer in an upright position. 3. Breathe out normally. 4. Place the mouthpiece in your mouth and seal your lips tightly around it. 5. Breathe in slowly and as deeply as possible, raising the piston or the ball toward the top of the column. 6. Hold your breath for 3-5 seconds or for as long as possible. Allow the piston or ball to fall to the bottom of the column. 7. Remove the mouthpiece from your mouth and breathe out normally. 8. Rest for a few seconds and repeat Steps 1 through 7 at least 10 times every 1-2 hours when you are awake. Take your time and take a few normal breaths between deep breaths. 9. The spirometer may include an indicator to show your best effort. Use the indicator as a goal to work toward during each repetition. 10. After each set of 10 deep breaths, practice coughing to be sure your lungs are clear. If you have an incision (the cut made at the time of surgery), support your incision when coughing by placing a pillow or rolled up towels firmly against it. Once you are able to get out of bed, walk around indoors and cough well. You may stop using the incentive spirometer when instructed by your caregiver.  RISKS AND COMPLICATIONS  Take your time so you do not get  dizzy or light-headed.  If you are in pain, you may need to take or ask for pain medication before doing incentive spirometry. It is harder to take a deep breath if you are having pain. AFTER  USE  Rest and breathe slowly and easily.  It can be helpful to keep track of a log of your progress. Your caregiver can provide you with a simple table to help with this. If you are using the spirometer at home, follow these instructions: [place] IF:   You are having difficultly using the spirometer.  You have trouble using the spirometer as often as instructed.  Your pain medication is not giving enough relief while using the spirometer.  You develop fever of 100.5 F (38.1 C) or higher. SEEK IMMEDIATE MEDICAL CARE IF:   You cough up bloody sputum that had not been present before.  You develop fever of 102 F (38.9 C) or greater.  You develop worsening pain at or near the incision site. MAKE SURE YOU:   Understand these instructions.  Will watch your condition.  Will get help right away if you are not doing well or get worse. Document Released: 11/04/2006 Document Revised: 09/16/2011 Document Reviewed: 01/05/2007 Exit[employer] Patient Information 2014 ExitCare, [place].   ________________________________________________________________________  WHAT IS A BLOOD TRANSFUSION? Blood Transfusion Information  A transfusion is the replacement of blood or some of its parts. Blood is made up of multiple cells which provide different functions.  Red blood cells carry oxygen and are used for blood loss replacement.  White blood cells fight against infection.  Platelets control bleeding.  Plasma helps clot blood.  Other blood products are available for specialized needs, such as hemophilia or other clotting disorders. BEFORE THE TRANSFUSION  Who gives blood for transfusions?   Healthy volunteers who are fully evaluated to make sure their blood is safe. This is blood bank blood. Transfusion therapy is the safest it has ever been in the practice of medicine. Before blood is taken from a donor, a complete history is taken to make sure that person has no history of diseases  nor engages in risky social behavior (examples are intravenous drug use or sexual activity with multiple partners). The donor's travel history is screened to minimize risk of transmitting infections, such as malaria. The donated blood is tested for signs of infectious diseases, such as HIV and hepatitis. The blood is then tested to be sure it is compatible with you in order to minimize the chance of a transfusion reaction. If you or a relative donates blood, this is often done in anticipation of surgery and is not appropriate for emergency situations. It takes many days to process the donated blood. RISKS AND COMPLICATIONS Although transfusion therapy is very safe and saves many lives, the main dangers of transfusion include:   Getting an infectious disease.  Developing a transfusion reaction. This is an allergic reaction to something in the blood you were given. Every precaution is taken to prevent this. The decision to have a blood transfusion has been considered carefully by your caregiver before blood is given. Blood is not given unless the benefits outweigh the risks. AFTER THE TRANSFUSION  Right after receiving a blood transfusion, you will usually feel much better and more energetic. This is especially true if your red blood cells have gotten low (anemic). The transfusion raises the level of the red blood cells which carry oxygen, and this usually causes an energy increase.  The nurse administering the transfusion will  monitor you carefully for complications. HOME CARE INSTRUCTIONS  No special instructions are needed after a transfusion. You may find your energy is better. Speak with your caregiver about any limitations on activity for underlying diseases you may have. SEEK MEDICAL CARE IF:   Your condition is not improving after your transfusion.  You develop redness or irritation at the intravenous (IV) site. SEEK IMMEDIATE MEDICAL CARE IF:  Any of the following symptoms occur over the  next 12 hours:  Shaking chills.  You have a temperature by mouth above 102 F (38.9 C), not controlled by medicine.  Chest, back, or muscle pain.  People around you feel you are not acting correctly or are confused.  Shortness of breath or difficulty breathing.  Dizziness and fainting.  You get a rash or develop hives.  You have a decrease in urine output.  Your urine turns a dark color or changes to pink, red, or brown. Any of the following symptoms occur over the next 10 days:  You have a temperature by mouth above 102 F (38.9 C), not controlled by medicine.  Shortness of breath.  Weakness after normal activity.  The white part of the eye turns yellow (jaundice).  You have a decrease in the amount of urine or are urinating less often.  Your urine turns a dark color or changes to pink, red, or brown. Document Released: 06/21/2000 Document Revised: 09/16/2011 Document Reviewed: 02/08/2008 [facility] Patient Information 2014 [place], [place].  _______________________________________________________________________

## 2017-08-20 ENCOUNTER — Other Ambulatory Visit: Payer: Self-pay

## 2017-08-20 ENCOUNTER — Encounter (HOSPITAL_COMMUNITY)
Admission: RE | Admit: 2017-08-20 | Discharge: 2017-08-20 | Disposition: A | Discharge status: Home / Self Care | Payer: Medicare Other | Source: Ambulatory Visit | Attending: Orthopedic Surgery | Admitting: Orthopedic Surgery

## 2017-08-20 ENCOUNTER — Encounter (HOSPITAL_COMMUNITY): Payer: Self-pay

## 2017-08-20 DIAGNOSIS — M1711 Unilateral primary osteoarthritis, right knee: Secondary | ICD-10-CM | POA: Insufficient documentation

## 2017-08-20 DIAGNOSIS — Z01812 Encounter for preprocedural laboratory examination: Secondary | ICD-10-CM | POA: Diagnosis not present

## 2017-08-20 LAB — CBC
HCT: 35 % — ABNORMAL LOW (ref 36.0–46.0)
Hemoglobin: 12 g/dL (ref 12.0–15.0)
MCH: 31.8 pg (ref 26.0–34.0)
MCHC: 34.3 g/dL (ref 30.0–36.0)
MCV: 92.8 fL (ref 78.0–100.0)
Platelets: 196 K/uL (ref 150–400)
RBC: 3.77 MIL/uL — ABNORMAL LOW (ref 3.87–5.11)
RDW: 13 % (ref 11.5–15.5)
WBC: 5.9 K/uL (ref 4.0–10.5)

## 2017-08-20 LAB — COMPREHENSIVE METABOLIC PANEL WITH GFR
ALT: 12 U/L — ABNORMAL LOW (ref 14–54)
AST: 26 U/L (ref 15–41)
Albumin: 4.3 g/dL (ref 3.5–5.0)
Alkaline Phosphatase: 122 U/L (ref 38–126)
Anion gap: 10 (ref 5–15)
BUN: 14 mg/dL (ref 6–20)
CO2: 24 mmol/L (ref 22–32)
Calcium: 9.7 mg/dL (ref 8.9–10.3)
Chloride: 108 mmol/L (ref 101–111)
Creatinine, Ser: 1.18 mg/dL — ABNORMAL HIGH (ref 0.44–1.00)
GFR calc Af Amer: 55 mL/min — ABNORMAL LOW
GFR calc non Af Amer: 47 mL/min — ABNORMAL LOW
Glucose, Bld: 95 mg/dL (ref 65–99)
Potassium: 4.8 mmol/L (ref 3.5–5.1)
Sodium: 142 mmol/L (ref 135–145)
Total Bilirubin: 0.8 mg/dL (ref 0.3–1.2)
Total Protein: 7.8 g/dL (ref 6.5–8.1)

## 2017-08-20 LAB — PROTIME-INR
INR: 0.97
Prothrombin Time: 12.8 s (ref 11.4–15.2)

## 2017-08-20 LAB — APTT: aPTT: 32 s (ref 24–36)

## 2017-08-20 LAB — SURGICAL PCR SCREEN
MRSA, PCR: NEGATIVE
STAPHYLOCOCCUS AUREUS: NEGATIVE

## 2017-08-21 ENCOUNTER — Ambulatory Visit: Payer: Self-pay | Admitting: Orthopedic Surgery

## 2017-08-21 NOTE — H&P (Signed)
Name Jessica [name], Jessica [name] [phone], F)  DOB 1951/04/15    Chief Complaint Right Knee Pain H&P Right Total Knee  Patient's Care Team Primary Care Provider: Nicanor Bake NP: [address], [place], [place] [address], Ph [phone], Fax [phone] NPI: [id number] Patient's Pharmacies [place] [facility]): [address] [place], [place] [address], Ph [phone], Fax [phone]   Vitals Ht: 5 ft 2.5 in 08/07/2017 11:30 am Pain Scale: 7 08/07/2017 11:34 am Pain Scale Type: Numeric 08/07/2017 11:34 am BP - 112/68 Pulse - 88  Allergies Reviewed Allergies MORPHINE: Itching  SULFA (SULFONAMIDE ANTIBIOTICS): Itching, Nausea    Medications Reviewed Medications aspirin 81 mg tablet,delayed release Take 1 tablet(s) every day by oral route. 07/23/17   entered Glendale Chard Flonase Allergy Relief 07/23/17   entered Glendale Chard gabapentin 800 mg tablet Take 1 tablet(s) 3 times a day by oral route. 07/23/17   entered Glendale Chard HYDROcodone 5 mg-acetaminophen 325 mg tablet Take 1 tablet(s) every 6 hours by oral route. 07/23/17   entered Glendale Chard iron 07/23/17   entered Glendale Chard levothyroxine 07/23/17   entered Glendale Chard lisinopril 20 mg tablet Take 1 tablet(s) every day by oral route. 07/23/17   entered Glendale Chard Lomotil 07/23/17   entered Claiborne Billings Hancock multivitamin 08/07/17   entered ALEXZANDREW PERKINS, PA-C Vitamin B12 07/23/17   entered Glendale Chard Vitamin D 07/23/17   entered Glendale Chard Zoloft 07/23/17   entered Glendale Chard   Problems Reviewed Problems Osteoarthritis of right knee joint  Bilateral impingement syndrome of shoulders    Family History Reviewed Family History Father - Father deceased   - Aortic aneurysm Mother - Mother deceased   - Cerebrovascular accident   - Depressive disorder   - Anxiety Brother - Crohn's disease   Social History Reviewed Social History Smoking Status: Never smoker Non-smoker Chewing  tobacco: none Alcohol intake: None Hand Dominance: Right Work related injury?: N Advance directive: N Medical Power of Attorney: N Live alone or with others?: with others   Surgical History Reviewed Surgical History Cholecystectomy Abdominal hysterectomy Hip Joint Replacement - Right Hip 2010 Knee Joint Replacement - 07/08/2017 Knee Surgery - 03/29/2015 - Left Knee Scope Knee Arthroscopy - 07/08/2013   Past Medical History Reviewed Past Medical History Anxiety Disorder: Y Colitis/Stomach Ulcers: Y Depression: Y Hypertension: Y Joint Pain: Y Sleep apnea: Y Swelling of Legs/Feet/Hands: Y Thyroid Problems/Goiter: Y - Hypothyroidism Notes: Vertigo,  Chron's Disease,  History of Bursitis,  Neuropathy    HPI Patient is a 67 year old female scheduled for a right total knee by Dr. Wynelle Link on August 25, 2017 at [facility]. The patient is a 67 year old female who is months out f throm their right hardware removal right knee. Post operative pain has been varies, depends on amount of walking. The patient does report swelling (and popping) and pain with range of motion (WB) The patient does indicate that these symptoms are worsening. Pain medications include: Hydrocodone. The patient is currently weigh-bearing as tolerated without the use of assistive devices. The patient is using a knee sleeve prn. She has had the hardware removed from the knee but has had increasing symptoms and pain since this summer. Most of the pain is at night and with any weight bearing activity. She is starting to have some pain even at rest. The knee is popping, grinding, and even has the sensation of buckling. There is no catching. She describes sedentary stiffness which does ease off after walking a bit.  Her symptoms seem to be progressing with time. She is finding more difficulty with transferring to a standing position, getting in and out of the car, and going up and down steps now. She feels she  has lost some of the motion is her knee.  She will occasionally use ice for the pain and has hydrocodone that she takes for her back. She is now ready to get the knee replaced at this time. Risks and benefits have been discussed with the patient and she is ready to proceed with surgery at this time.   ROS Constitutional: Constitutional: no fever, chills, night sweats, or significant weight loss.  Cardiovascular: Cardiovascular: no palpitations or chest pain.  Respiratory: Respiratory: no cough or shortness of breath and No COPD.  Gastrointestinal: Gastrointestinal: no vomiting or nausea.  Musculoskeletal: Musculoskeletal: Joint Pain and swelling in Joints and back pain.  Neurologic: Neurologic: no numbness or tingling and difficulty with balance.   Physical Exam Patient is a 67 year old female.  General Mental Status - Alert, cooperative and good historian. General Appearance - pleasant, Not in acute distress. Orientation - Oriented X3. Build & Nutrition - Well nourished and Well developed.  Head and Neck Bilateral Hearing aids Upper and lower dentures Head - normocephalic, atraumatic . Neck Global Assessment - supple, no bruit auscultated on the right, no bruit auscultated on the left.  Eye Wears glasses Pupil - Bilateral - PERR Motion - Bilateral - EOMI.  Chest and Lung Exam Auscultation Breath sounds - clear at anterior chest wall and clear at posterior chest wall. Adventitious sounds - No Adventitious sounds.  Cardiovascular Auscultation Rhythm - Regular rate and rhythm. Heart Sounds - S1 WNL and S2 WNL. Murmurs & Other Heart Sounds - Auscultation of the heart reveals - No Murmurs.  Abdomen Palpation/Percussion Tenderness - Abdomen is non-tender to palpation. Abdomen is soft. Auscultation Auscultation of the abdomen reveals - Bowel sounds normal. Slightly round.  Genitourinary Note: Not done, not pertinent to present illness  Musculoskeletal Her RIGHT  knee shows no effusion. She has a slight valgus deformity in the knee. Range of motion 5-125. There is moderate crepitus on range of motion. She is tender lateral greater than medial with no instability. Anterior lateral incision healed.  Radiographs-AP and lateral of the RIGHT knee show bone-on-bone change in the lateral compartment with patellofemoral involvement also.   Assessment / Plan 1. 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 Lyndel Pleasure [facility] Internal Medicine IV TXA Anesthesia Issues: None Patient was instructed on what medications to stop prior to surgery. - Follow up visit in 2 weeks with Dr. Wynelle Link - Begin physical therapy following surgery - Pre-operative lab work as pre Pre-Surgical Testing - Prescriptions will be provided in hospital at time of discharge  Return to [place], MD for [place] at [facility] on 09/09/2017 at 01:00 PM  Encounter signed-off by Mickel Crow, PA-C

## 2017-08-21 NOTE — H&P (View-Only) (Signed)
Name Jessica [name], Jessica [name] [phone], F)  DOB 10/04/1950    Chief Complaint Right Knee Pain H&P Right Total Knee  Patient's Care Team Primary Care Provider: Nicanor Bake NP: [address], [place], [place] [address], Ph [phone], Fax [phone] NPI: [id number] Patient's Pharmacies [place] [facility]): [address] [place], [place] [address], Ph [phone], Fax [phone]   Vitals Ht: 5 ft 2.5 in 08/07/2017 11:30 am Pain Scale: 7 08/07/2017 11:34 am Pain Scale Type: Numeric 08/07/2017 11:34 am BP - 112/68 Pulse - 88  Allergies Reviewed Allergies MORPHINE: Itching  SULFA (SULFONAMIDE ANTIBIOTICS): Itching, Nausea    Medications Reviewed Medications aspirin 81 mg tablet,delayed release Take 1 tablet(s) every day by oral route. 07/23/17   entered Glendale Chard Flonase Allergy Relief 07/23/17   entered Glendale Chard gabapentin 800 mg tablet Take 1 tablet(s) 3 times a day by oral route. 07/23/17   entered Glendale Chard HYDROcodone 5 mg-acetaminophen 325 mg tablet Take 1 tablet(s) every 6 hours by oral route. 07/23/17   entered Glendale Chard iron 07/23/17   entered Glendale Chard levothyroxine 07/23/17   entered Glendale Chard lisinopril 20 mg tablet Take 1 tablet(s) every day by oral route. 07/23/17   entered Glendale Chard Lomotil 07/23/17   entered Claiborne Billings Hancock multivitamin 08/07/17   entered Roshan Roback, PA-C Vitamin B12 07/23/17   entered Glendale Chard Vitamin D 07/23/17   entered Glendale Chard Zoloft 07/23/17   entered Glendale Chard   Problems Reviewed Problems Osteoarthritis of right knee joint  Bilateral impingement syndrome of shoulders    Family History Reviewed Family History Father - Father deceased   - Aortic aneurysm Mother - Mother deceased   - Cerebrovascular accident   - Depressive disorder   - Anxiety Brother - Crohn's disease   Social History Reviewed Social History Smoking Status: Never smoker Non-smoker Chewing  tobacco: none Alcohol intake: None Hand Dominance: Right Work related injury?: N Advance directive: N Medical Power of Attorney: N Live alone or with others?: with others   Surgical History Reviewed Surgical History Cholecystectomy Abdominal hysterectomy Hip Joint Replacement - Right Hip 2010 Knee Joint Replacement - 07/08/2017 Knee Surgery - 03/29/2015 - Left Knee Scope Knee Arthroscopy - 07/08/2013   Past Medical History Reviewed Past Medical History Anxiety Disorder: Y Colitis/Stomach Ulcers: Y Depression: Y Hypertension: Y Joint Pain: Y Sleep apnea: Y Swelling of Legs/Feet/Hands: Y Thyroid Problems/Goiter: Y - Hypothyroidism Notes: Vertigo,  Chron's Disease,  History of Bursitis,  Neuropathy    HPI Patient is a 67 year old female scheduled for a right total knee by Dr. Wynelle Link on August 25, 2017 at [facility]. The patient is a 67 year old female who is months out f throm their right hardware removal right knee. Post operative pain has been varies, depends on amount of walking. The patient does report swelling (and popping) and pain with range of motion (WB) The patient does indicate that these symptoms are worsening. Pain medications include: Hydrocodone. The patient is currently weigh-bearing as tolerated without the use of assistive devices. The patient is using a knee sleeve prn. She has had the hardware removed from the knee but has had increasing symptoms and pain since this summer. Most of the pain is at night and with any weight bearing activity. She is starting to have some pain even at rest. The knee is popping, grinding, and even has the sensation of buckling. There is no catching. She describes sedentary stiffness which does ease off after walking a bit.  Her symptoms seem to be progressing with time. She is finding more difficulty with transferring to a standing position, getting in and out of the car, and going up and down steps now. She feels she  has lost some of the motion is her knee.  She will occasionally use ice for the pain and has hydrocodone that she takes for her back. She is now ready to get the knee replaced at this time. Risks and benefits have been discussed with the patient and she is ready to proceed with surgery at this time.   ROS Constitutional: Constitutional: no fever, chills, night sweats, or significant weight loss.  Cardiovascular: Cardiovascular: no palpitations or chest pain.  Respiratory: Respiratory: no cough or shortness of breath and No COPD.  Gastrointestinal: Gastrointestinal: no vomiting or nausea.  Musculoskeletal: Musculoskeletal: Joint Pain and swelling in Joints and back pain.  Neurologic: Neurologic: no numbness or tingling and difficulty with balance.   Physical Exam Patient is a 67 year old female.  General Mental Status - Alert, cooperative and good historian. General Appearance - pleasant, Not in acute distress. Orientation - Oriented X3. Build & Nutrition - Well nourished and Well developed.  Head and Neck Bilateral Hearing aids Upper and lower dentures Head - normocephalic, atraumatic . Neck Global Assessment - supple, no bruit auscultated on the right, no bruit auscultated on the left.  Eye Wears glasses Pupil - Bilateral - PERR Motion - Bilateral - EOMI.  Chest and Lung Exam Auscultation Breath sounds - clear at anterior chest wall and clear at posterior chest wall. Adventitious sounds - No Adventitious sounds.  Cardiovascular Auscultation Rhythm - Regular rate and rhythm. Heart Sounds - S1 WNL and S2 WNL. Murmurs & Other Heart Sounds - Auscultation of the heart reveals - No Murmurs.  Abdomen Palpation/Percussion Tenderness - Abdomen is non-tender to palpation. Abdomen is soft. Auscultation Auscultation of the abdomen reveals - Bowel sounds normal. Slightly round.  Genitourinary Note: Not done, not pertinent to present illness  Musculoskeletal Her RIGHT  knee shows no effusion. She has a slight valgus deformity in the knee. Range of motion 5-125. There is moderate crepitus on range of motion. She is tender lateral greater than medial with no instability. Anterior lateral incision healed.  Radiographs-AP and lateral of the RIGHT knee show bone-on-bone change in the lateral compartment with patellofemoral involvement also.   Assessment / Plan 1. 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 Lyndel Pleasure [facility] Internal Medicine IV TXA Anesthesia Issues: None Patient was instructed on what medications to stop prior to surgery. - Follow up visit in 2 weeks with Dr. Wynelle Link - Begin physical therapy following surgery - Pre-operative lab work as pre Pre-Surgical Testing - Prescriptions will be provided in hospital at time of discharge  Return to [place], MD for [place] at [facility] on 09/09/2017 at 01:00 PM  Encounter signed-off by Mickel Crow, PA-C

## 2017-08-24 ENCOUNTER — Encounter (HOSPITAL_COMMUNITY): Payer: Self-pay

## 2017-08-24 ENCOUNTER — Emergency Department (HOSPITAL_COMMUNITY): Payer: Medicare Other

## 2017-08-24 ENCOUNTER — Emergency Department (HOSPITAL_COMMUNITY)
Admission: EM | Admit: 2017-08-24 | Discharge: 2017-08-24 | Disposition: A | Discharge status: Home / Self Care | Payer: Medicare Other | Source: Home / Self Care | Attending: Emergency Medicine | Admitting: Emergency Medicine

## 2017-08-24 DIAGNOSIS — Z79899 Other long term (current) drug therapy: Secondary | ICD-10-CM

## 2017-08-24 DIAGNOSIS — F419 Anxiety disorder, unspecified: Secondary | ICD-10-CM

## 2017-08-24 DIAGNOSIS — F329 Major depressive disorder, single episode, unspecified: Secondary | ICD-10-CM | POA: Diagnosis not present

## 2017-08-24 DIAGNOSIS — M25552 Pain in left hip: Secondary | ICD-10-CM | POA: Insufficient documentation

## 2017-08-24 DIAGNOSIS — M549 Dorsalgia, unspecified: Secondary | ICD-10-CM | POA: Diagnosis not present

## 2017-08-24 DIAGNOSIS — I10 Essential (primary) hypertension: Secondary | ICD-10-CM | POA: Insufficient documentation

## 2017-08-24 DIAGNOSIS — E039 Hypothyroidism, unspecified: Secondary | ICD-10-CM | POA: Insufficient documentation

## 2017-08-24 DIAGNOSIS — K5010 Crohn's disease of large intestine without complications: Secondary | ICD-10-CM | POA: Diagnosis not present

## 2017-08-24 DIAGNOSIS — M542 Cervicalgia: Secondary | ICD-10-CM

## 2017-08-24 DIAGNOSIS — Z9049 Acquired absence of other specified parts of digestive tract: Secondary | ICD-10-CM | POA: Insufficient documentation

## 2017-08-24 DIAGNOSIS — Z7982 Long term (current) use of aspirin: Secondary | ICD-10-CM | POA: Insufficient documentation

## 2017-08-24 DIAGNOSIS — M1711 Unilateral primary osteoarthritis, right knee: Secondary | ICD-10-CM | POA: Diagnosis not present

## 2017-08-24 DIAGNOSIS — G8929 Other chronic pain: Secondary | ICD-10-CM | POA: Diagnosis not present

## 2017-08-24 DIAGNOSIS — F411 Generalized anxiety disorder: Secondary | ICD-10-CM | POA: Diagnosis not present

## 2017-08-24 DIAGNOSIS — M1731 Unilateral post-traumatic osteoarthritis, right knee: Secondary | ICD-10-CM | POA: Diagnosis not present

## 2017-08-24 DIAGNOSIS — M1612 Unilateral primary osteoarthritis, left hip: Secondary | ICD-10-CM | POA: Diagnosis not present

## 2017-08-24 MED ORDER — OXYCODONE-ACETAMINOPHEN 5-325 MG PO TABS
2.0000 | ORAL_TABLET | Freq: Once | ORAL | Status: AC
  Administered 2017-08-24: 2 via ORAL
  Filled 2017-08-24: qty 2

## 2017-08-24 MED ORDER — OXYCODONE-ACETAMINOPHEN 5-325 MG PO TABS: 1.0000 | ORAL_TABLET | ORAL | 0 refills | Status: DC | PRN

## 2017-08-24 MED ORDER — BUPIVACAINE LIPOSOME 1.3 % IJ SUSP
20.0000 mL | Freq: Once | INTRAMUSCULAR | Status: DC
  Filled 2017-08-24: qty 20

## 2017-08-24 MED ORDER — TRANEXAMIC ACID 1000 MG/10ML IV SOLN
1000.0000 mg | INTRAVENOUS | Status: AC
  Administered 2017-08-25: 1000 mg via INTRAVENOUS
  Filled 2017-08-24: qty 1100

## 2017-08-24 NOTE — ED Triage Notes (Signed)
Pt is scheduled for R knee replacement tomorrow.  Here today for c/o pain in neck, both arms, left hip, left groin, and left leg.   Reports Pain started 1 week ago, no injury.  Pt saw orthopedist 3 weeks ago and had injections in both shoulders and was told she needed an MRI but was too close to her surgery so they were waiting to treat the other concerns.  Has been taking hydrocodone without relief.

## 2017-08-24 NOTE — ED Provider Notes (Signed)
[facility] EMERGENCY DEPARTMENT Provider Note   CSN: [id number] Arrival date & time: 08/24/17  0017     History   Chief Complaint Chief Complaint  Patient presents with  . pain in multiple sites    HPI Jessica [name] is a 67 y.o. female with past medical history as outlined below, most significant for osteoarthritis and degenerative changes in her cervical and lumbar spine who is scheduled for a total right knee replacement tomorrow by Dr. Posey Pronto presenting with worse pain in her neck bilateral shoulders and also her low back with radiation into the left hip.  Her pain is been slowly progressive over the past week.  She denies new injury or falls or overuse.  She was seen by Dr. supple for her neck and shoulder symptoms several weeks ago and had steroid injections which improved her pain for about a week.  She was told she needs an MRI of her cervical spine but this is being delayed until after tomorrow surgery.  He is currently taking hydrocodone which is not relieving her pain symptoms.  She denies weakness or numbness in her arms or legs and denies fevers or chills, headache.  She also denies dysuria or increased urinary frequency, no cranial numbness or loss of control of bowel or bladder.  She has been taken off of her NSAIDs in anticipation of surgery.  The history is provided by the patient.    Past Medical History:  Diagnosis Date  . Anxiety attack   . Arthritis   . Back pain, chronic   . Crohn's colitis ([place])   . Depression (emotion)   . Dysrhythmia   . GERD (gastroesophageal reflux disease)   . Hypertension   . Hypothyroid   . Neuropathy   . Sleep apnea    cpap is not working per patient - needs to find a new doctor - not used since 7/16 per pat   . Vertigo    follow[facility] by Dr Wardell [facility]- [facility]     Patient Active Problem List   Diagnosis Date Noted  . Painful orthopaedic hardware ([place]) 08/07/2016  . Closed fracture of lateral portion of right tibial plateau  02/29/2016  . Tibial plateau fracture 02/29/2016  . Acute medial meniscal tear 03/28/2015  . Crohn's colitis ([place]) 02/10/2013  . Unspecified hypothyroidism 02/10/2013  . Neuropathy 02/10/2013    Past Surgical History:  Procedure Laterality Date  . ABDOMINAL HYSTERECTOMY    . CHOLECYSTECTOMY    . COLON SURGERY     for Crohn's in the 37s in [place]  . COLONOSCOPY N/A 03/11/2013   Procedure: COLONOSCOPY;  Surgeon: Rogene Houston, MD;  Location: [facility]ITE;  Service: Endoscopy;  Laterality: N/A;  1200  . fatty tumor removed    . HARDWARE REMOVAL Right 08/07/2016   Procedure: HARDWARE REMOVAL;  Surgeon: Gaynelle Arabian, MD;  Location: [facility] ORS;  Service: Orthopedics;  Laterality: Right;  . Hip replacement      Rt hip in 2010 in [place]  . KNEE ARTHROSCOPY Left 03/29/2015   Procedure: ARTHROSCOPY LEFT KNEE WITH MENSICAL DEBRIDEMENT, chondroplasty;  Surgeon: Gaynelle Arabian, MD;  Location: [facility] ORS;  Service: Orthopedics;  Laterality: Left;  . ORIF TIBIA PLATEAU Right 02/29/2016   Procedure: OPEN REDUCTION INTERNAL FIXATION (ORIF RIGHT  TIBIAL PLATEAU FRACTURE;  Surgeon: Gaynelle Arabian, MD;  Location: [facility] ORS;  Service: Orthopedics;  Laterality: Right;    OB History    No data available       Home Medications  Prior to Admission medications   Medication Sig Start Date End Date Taking? Authorizing Provider  aspirin EC 81 MG tablet Take 81 mg by mouth daily.    [provider]  bisacodyl (DULCOLAX) 10 MG suppository Place 1 suppository (10 mg total) rectally daily as needed for moderate constipation. Patient not taking: Reported on 08/13/2017 03/04/16   Dara Lords, Alexzandrew L, PA-C  Cyanocobalamin (B-12) 5000 MCG CAPS Take 500 mg by mouth daily.    [provider]  diphenoxylate-atropine (LOMOTIL) 2.5-0.025 MG per tablet Take 1 tablet by mouth 3 (three) times daily as needed for diarrhea or loose stools. Patient taking differently: Take 2 tablets by mouth daily.  09/13/14    Rehman, Mechele Dawley, MD  docusate sodium (COLACE) 100 MG capsule Take 1 capsule (100 mg total) by mouth 2 (two) times daily. Patient taking differently: Take 100 mg by mouth daily as needed.  03/04/16   Perkins, Alexzandrew L, PA-C  fluticasone (FLONASE) 50 MCG/ACT nasal spray Place 1 spray into the nose 2 (two) times daily.     [provider]  gabapentin (NEURONTIN) 800 MG tablet Take 800 mg by mouth 3 (three) times daily.  06/21/16   [provider]  HYDROcodone-acetaminophen (NORCO/VICODIN) 5-325 MG tablet Take 1 tablet by mouth at bedtime.    [provider]  HYDROcodone-acetaminophen (NORCO/VICODIN) 5-325 MG tablet hydrocodone 5 mg-acetaminophen 325 mg tablet  Take 1 tablet at bedtime    [provider]  levothyroxine (SYNTHROID, LEVOTHROID) 88 MCG tablet Take 88 mcg by mouth daily before breakfast.  11/27/16   [provider]  lisinopril (PRINIVIL,ZESTRIL) 20 MG tablet Take 20 mg by mouth daily.    [provider]  loratadine (CLARITIN) 10 MG tablet Take 10 mg by mouth daily.    [provider]  meloxicam (MOBIC) 15 MG tablet Take 1 tablet (15 mg total) by mouth daily. Patient not taking: Reported on 08/13/2017 12/26/16   Tyson Dense T, DPM  mesalamine (PENTASA) 500 MG CR capsule Take 1 capsule (500 mg total) by mouth 4 (four) times daily. Patient not taking: Reported on 08/13/2017 09/30/16   Butch Penny, NP  methylPREDNISolone (MEDROL DOSEPAK) 4 MG TBPK tablet 6 day dose pack - take as directed Patient not taking: Reported on 08/13/2017 12/26/16   Hyatt, Max T, DPM  metoCLOPramide (REGLAN) 5 MG tablet Take 1 tablet (5 mg total) by mouth every 8 (eight) hours as needed for nausea (if ondansetron (ZOFRAN) ineffective.). Patient not taking: Reported on 08/13/2017 03/04/16   Dara Lords, Alexzandrew L, PA-C  Multiple Vitamin (MULTIVITAMIN WITH MINERALS) TABS tablet Take 1 tablet by mouth daily. Senior Multi Vitamin    [provider]    naproxen sodium (ALEVE) 220 MG tablet Take 880 mg by mouth daily as needed (pain).    [provider]  oxyCODONE-acetaminophen (PERCOCET/ROXICET) 5-325 MG tablet Take 1 tablet by mouth every 4 (four) hours as needed. 08/24/17   Evalee Jefferson, PA-C  pantoprazole (PROTONIX) 40 MG tablet Take 1 tablet (40 mg total) by mouth daily. Patient not taking: Reported on 08/13/2017 09/30/16   Butch Penny, NP  RaNITidine HCl (EQ RANITIDINE PO) Take 1 tablet by mouth daily as needed (heartburn).    [provider]  sertraline (ZOLOFT) 100 MG tablet Take 1 tablet (100 mg total) by mouth at bedtime. 08/06/17   Cloria Spring, MD  sodium phosphate (FLEET) 7-19 GM/118ML ENEM Place 133 mLs (1 enema total) rectally once as needed for severe constipation. Patient  not taking: Reported on 08/13/2017 03/04/16   Dara Lords, Alexzandrew L, PA-C  Vitamin D, Ergocalciferol, (DRISDOL) 50000 units CAPS capsule Take 50,000 Units by mouth 2 (two) times a week. Monday and Tuesdays 12/05/16   [provider]  XIIDRA 5 % SOLN Place 1 drop into the right eye daily as needed (dry eye).  11/29/16   [provider]    Family History Family History  Problem Relation Age of Onset  . Crohn's disease Brother   . Depression Mother   . Anxiety disorder Mother   . Depression Sister   . Aneurysm Father   . Colon cancer Neg Hx     Social History Social History   Tobacco Use  . Smoking status: Never Smoker  . Smokeless tobacco: Never Used  Substance Use Topics  . Alcohol use: No    Alcohol/week: 0.0 oz  . Drug use: No     Allergies   Morphine and related; Robaxin [methocarbamol]; and Sulfa antibiotics   Review of Systems Review of Systems  Constitutional: Negative for fever.  Musculoskeletal: Positive for arthralgias, back pain and neck pain. Negative for joint swelling and myalgias.  Neurological: Negative for weakness and numbness.     Physical Exam Updated Vital Signs BP (!) 146/85  (BP Location: Right Arm)   Pulse 76   Temp 97.7 F (36.5 C) (Oral)   Resp 18   Ht 5' 2"  (1.575 m)   Wt 75.3 kg (166 lb)   SpO2 100%   BMI 30.36 kg/m   Physical Exam  Constitutional: She appears well-developed and well-nourished.  HENT:  Head: Atraumatic.  Neck: Normal range of motion.  Cardiovascular:  Pulses equal bilaterally  Musculoskeletal: She exhibits tenderness.       Cervical back: She exhibits tenderness and spasm. She exhibits no bony tenderness, no swelling and no edema.       Lumbar back: She exhibits bony tenderness.       Back:  Tender to palpation right paracervical spine.  She has decreased range of motion with attempts at right word flexion of her C-spine.  She displays full range of motion of her shoulders but with discomfort.  There is no crepitus or edema of her shoulders.  Midline tenderness to palpation of her lumbar spine without palpable deformity or step-offs.  Neurological: She is alert. She has normal strength. She displays normal reflexes. No sensory deficit.  Skin: Skin is warm and dry.  Psychiatric: She has a normal mood and affect.     ED Treatments / Results  Labs (all labs ordered are listed, but only abnormal results are displayed) Labs Reviewed - No data to display  EKG  EKG Interpretation None       Radiology Dg Lumbar Spine Complete  Result Date: 08/24/2017 CLINICAL DATA:  Pain in back and left hip.  No known injury. EXAM: LUMBAR SPINE - COMPLETE 4+ VIEW COMPARISON:  05/01/2015. FINDINGS: The bones appear diffusely osteopenic. The vertebral body heights are well preserved. No fracture identified. Mild multi level disc space narrowing and ventral endplate spurring. Most advanced at L5-S1 and T11-T12. IMPRESSION: 1. No acute findings identified. 2. Degenerative disc disease. Electronically Signed   By: Kerby Moors M.D.   On: 08/24/2017 12:33   Dg Hip Unilat W Or Wo Pelvis 2-3 Views Left  Result Date: 08/24/2017 CLINICAL DATA:   Left hip pain EXAM: DG HIP (WITH OR WITHOUT PELVIS) 2-3V LEFT COMPARISON:  None. FINDINGS: Frontal pelvis shows right hip replacement, incompletely visualized.  AP and frog-leg lateral views of the left hip show no fracture. No worrisome lytic or sclerotic osseous abnormality. IMPRESSION: Negative. Electronically Signed   By: Misty Stanley M.D.   On: 08/24/2017 12:31    Procedures Procedures (including critical care time)  Medications Ordered in ED Medications  oxyCODONE-acetaminophen (PERCOCET/ROXICET) 5-325 MG per tablet 2 tablet (2 tablets Oral Given 08/24/17 1220)     Initial Impression / Assessment and Plan / ED Course  I have reviewed the triage vital signs and the nursing notes.  Pertinent labs & imaging results that were available during my care of the patient were reviewed by me and considered in my medical decision making (see chart for details).     Patient with acute on chronic pain of multiple sites suggesting musculoskeletal source.  She has had no fevers, no recent falls or injuries.  She is currently seeing Dr. Wynelle Link, with planned right total knee surgery tomorrow.  She was given oxycodone No. 6 to take today in place of her hydrocodone for improved pain relief.  Plan follow-up with Dr. Wynelle Link as scheduled.  Final Clinical Impressions(s) / ED Diagnoses   Final diagnoses:  Cervical pain (neck)  Pain of left hip joint    ED Discharge Orders        Ordered    oxyCODONE-acetaminophen (PERCOCET/ROXICET) 5-325 MG tablet  Every 4 hours PRN     08/24/17 1302       Evalee Jefferson, PA-C 08/24/17 1306    Pattricia Boss, MD 08/24/17 [phone]

## 2017-08-24 NOTE — Discharge Instructions (Signed)
Take your oxycodone in place of your hydrocodone today.  Plan to see Dr. Wynelle Link tomorrow as scheduled for your surgery.

## 2017-08-25 ENCOUNTER — Inpatient Hospital Stay (HOSPITAL_COMMUNITY)
Admission: RE | Admit: 2017-08-25 | Discharge: 2017-08-28 | DRG: 470 | Disposition: A | Discharge status: Home / Self Care | Payer: Medicare Other | Source: Ambulatory Visit | Attending: Orthopedic Surgery | Admitting: Orthopedic Surgery

## 2017-08-25 ENCOUNTER — Inpatient Hospital Stay (HOSPITAL_COMMUNITY): Payer: Medicare Other | Admitting: Certified Registered Nurse Anesthetist

## 2017-08-25 ENCOUNTER — Other Ambulatory Visit: Payer: Self-pay

## 2017-08-25 ENCOUNTER — Encounter (HOSPITAL_COMMUNITY)
Admission: RE | Disposition: A | Discharge status: Home / Self Care | Payer: Self-pay | Source: Ambulatory Visit | Attending: Orthopedic Surgery

## 2017-08-25 ENCOUNTER — Encounter (HOSPITAL_COMMUNITY): Payer: Self-pay | Admitting: *Deleted

## 2017-08-25 DIAGNOSIS — I10 Essential (primary) hypertension: Secondary | ICD-10-CM | POA: Diagnosis not present

## 2017-08-25 DIAGNOSIS — F329 Major depressive disorder, single episode, unspecified: Secondary | ICD-10-CM | POA: Diagnosis not present

## 2017-08-25 DIAGNOSIS — M549 Dorsalgia, unspecified: Secondary | ICD-10-CM | POA: Diagnosis not present

## 2017-08-25 DIAGNOSIS — M1731 Unilateral post-traumatic osteoarthritis, right knee: Secondary | ICD-10-CM | POA: Diagnosis not present

## 2017-08-25 DIAGNOSIS — E039 Hypothyroidism, unspecified: Secondary | ICD-10-CM | POA: Diagnosis not present

## 2017-08-25 DIAGNOSIS — F411 Generalized anxiety disorder: Secondary | ICD-10-CM | POA: Diagnosis not present

## 2017-08-25 DIAGNOSIS — Z9049 Acquired absence of other specified parts of digestive tract: Secondary | ICD-10-CM

## 2017-08-25 DIAGNOSIS — K219 Gastro-esophageal reflux disease without esophagitis: Secondary | ICD-10-CM | POA: Diagnosis present

## 2017-08-25 DIAGNOSIS — Z823 Family history of stroke: Secondary | ICD-10-CM | POA: Diagnosis not present

## 2017-08-25 DIAGNOSIS — Z7982 Long term (current) use of aspirin: Secondary | ICD-10-CM

## 2017-08-25 DIAGNOSIS — G8929 Other chronic pain: Secondary | ICD-10-CM | POA: Diagnosis present

## 2017-08-25 DIAGNOSIS — G629 Polyneuropathy, unspecified: Secondary | ICD-10-CM | POA: Diagnosis not present

## 2017-08-25 DIAGNOSIS — Z79891 Long term (current) use of opiate analgesic: Secondary | ICD-10-CM | POA: Diagnosis not present

## 2017-08-25 DIAGNOSIS — Z79890 Hormone replacement therapy (postmenopausal): Secondary | ICD-10-CM | POA: Diagnosis not present

## 2017-08-25 DIAGNOSIS — M25761 Osteophyte, right knee: Secondary | ICD-10-CM | POA: Diagnosis present

## 2017-08-25 DIAGNOSIS — K5010 Crohn's disease of large intestine without complications: Secondary | ICD-10-CM | POA: Diagnosis not present

## 2017-08-25 DIAGNOSIS — M1710 Unilateral primary osteoarthritis, unspecified knee: Secondary | ICD-10-CM | POA: Diagnosis present

## 2017-08-25 DIAGNOSIS — Z96641 Presence of right artificial hip joint: Secondary | ICD-10-CM | POA: Diagnosis present

## 2017-08-25 DIAGNOSIS — M1711 Unilateral primary osteoarthritis, right knee: Secondary | ICD-10-CM | POA: Diagnosis not present

## 2017-08-25 DIAGNOSIS — Z79899 Other long term (current) drug therapy: Secondary | ICD-10-CM

## 2017-08-25 DIAGNOSIS — M179 Osteoarthritis of knee, unspecified: Secondary | ICD-10-CM | POA: Diagnosis present

## 2017-08-25 DIAGNOSIS — G4730 Sleep apnea, unspecified: Secondary | ICD-10-CM | POA: Diagnosis not present

## 2017-08-25 DIAGNOSIS — G8918 Other acute postprocedural pain: Secondary | ICD-10-CM | POA: Diagnosis not present

## 2017-08-25 HISTORY — PX: TOTAL KNEE ARTHROPLASTY: SHX125

## 2017-08-25 LAB — TYPE AND SCREEN
ABO/RH(D): O POS
Antibody Screen: NEGATIVE

## 2017-08-25 SURGERY — ARTHROPLASTY, KNEE, TOTAL
Anesthesia: Monitor Anesthesia Care | Site: Knee | Laterality: Right

## 2017-08-25 MED ORDER — FENTANYL CITRATE (PF) 100 MCG/2ML IJ SOLN
INTRAMUSCULAR | Status: AC
  Administered 2017-08-25: 100 ug
  Filled 2017-08-25: qty 2

## 2017-08-25 MED ORDER — MIDAZOLAM HCL 2 MG/2ML IJ SOLN
0.5000 mg | INTRAMUSCULAR | Status: DC | PRN
  Administered 2017-08-25: 0.5 mg via INTRAVENOUS

## 2017-08-25 MED ORDER — DEXAMETHASONE SODIUM PHOSPHATE 10 MG/ML IJ SOLN
10.0000 mg | Freq: Once | INTRAMUSCULAR | Status: AC
  Administered 2017-08-25: 10 mg via INTRAVENOUS

## 2017-08-25 MED ORDER — ACETAMINOPHEN 325 MG PO TABS
650.0000 mg | ORAL_TABLET | ORAL | Status: DC | PRN
  Administered 2017-08-26 – 2017-08-28 (×2): 650 mg via ORAL
  Filled 2017-08-25 (×2): qty 2

## 2017-08-25 MED ORDER — GABAPENTIN 400 MG PO CAPS
800.0000 mg | ORAL_CAPSULE | Freq: Three times a day (TID) | ORAL | Status: DC
  Administered 2017-08-25 – 2017-08-28 (×9): 800 mg via ORAL
  Filled 2017-08-25 (×9): qty 2

## 2017-08-25 MED ORDER — SODIUM CHLORIDE 0.9 % IJ SOLN
INTRAMUSCULAR | Status: AC
  Filled 2017-08-25: qty 10

## 2017-08-25 MED ORDER — DEXAMETHASONE SODIUM PHOSPHATE 10 MG/ML IJ SOLN
INTRAMUSCULAR | Status: AC
  Filled 2017-08-25: qty 1

## 2017-08-25 MED ORDER — PROMETHAZINE HCL 25 MG/ML IJ SOLN: 6.2500 mg | INTRAMUSCULAR | Status: DC | PRN

## 2017-08-25 MED ORDER — DIPHENHYDRAMINE HCL 12.5 MG/5ML PO ELIX: 12.5000 mg | ORAL_SOLUTION | ORAL | Status: DC | PRN

## 2017-08-25 MED ORDER — DIAZEPAM 5 MG PO TABS
5.0000 mg | ORAL_TABLET | Freq: Four times a day (QID) | ORAL | Status: DC | PRN
  Administered 2017-08-25 – 2017-08-28 (×6): 5 mg via ORAL
  Filled 2017-08-25 (×6): qty 1

## 2017-08-25 MED ORDER — SERTRALINE HCL 100 MG PO TABS
100.0000 mg | ORAL_TABLET | Freq: Every day | ORAL | Status: DC
  Administered 2017-08-25 – 2017-08-27 (×3): 100 mg via ORAL
  Filled 2017-08-25 (×3): qty 1

## 2017-08-25 MED ORDER — DOCUSATE SODIUM 100 MG PO CAPS
100.0000 mg | ORAL_CAPSULE | Freq: Two times a day (BID) | ORAL | Status: DC
  Administered 2017-08-25 – 2017-08-28 (×6): 100 mg via ORAL
  Filled 2017-08-25 (×6): qty 1

## 2017-08-25 MED ORDER — POLYETHYLENE GLYCOL 3350 17 G PO PACK
17.0000 g | PACK | Freq: Every day | ORAL | Status: DC | PRN
  Administered 2017-08-28: 17 g via ORAL
  Filled 2017-08-25: qty 1

## 2017-08-25 MED ORDER — BISACODYL 10 MG RE SUPP: 10.0000 mg | Freq: Every day | RECTAL | Status: DC | PRN

## 2017-08-25 MED ORDER — LACTATED RINGERS IV SOLN
INTRAVENOUS | Status: DC
  Administered 2017-08-25 (×2): via INTRAVENOUS

## 2017-08-25 MED ORDER — ACETAMINOPHEN 650 MG RE SUPP: 650.0000 mg | RECTAL | Status: DC | PRN

## 2017-08-25 MED ORDER — PHENOL 1.4 % MT LIQD: 1.0000 | OROMUCOSAL | Status: DC | PRN

## 2017-08-25 MED ORDER — SODIUM CHLORIDE 0.9 % IR SOLN
Status: DC | PRN
  Administered 2017-08-25: 1000 mL

## 2017-08-25 MED ORDER — OXYCODONE HCL 5 MG PO TABS
10.0000 mg | ORAL_TABLET | ORAL | Status: DC | PRN
  Administered 2017-08-25 – 2017-08-27 (×10): 10 mg via ORAL
  Filled 2017-08-25 (×9): qty 2

## 2017-08-25 MED ORDER — ONDANSETRON HCL 4 MG/2ML IJ SOLN
INTRAMUSCULAR | Status: DC | PRN
  Administered 2017-08-25: 4 mg via INTRAVENOUS

## 2017-08-25 MED ORDER — PROPOFOL 10 MG/ML IV BOLUS
INTRAVENOUS | Status: AC
  Filled 2017-08-25: qty 40

## 2017-08-25 MED ORDER — FLUTICASONE PROPIONATE 50 MCG/ACT NA SUSP
1.0000 | Freq: Two times a day (BID) | NASAL | Status: DC
  Administered 2017-08-26 – 2017-08-28 (×5): 1 via NASAL
  Filled 2017-08-25: qty 16

## 2017-08-25 MED ORDER — FENTANYL CITRATE (PF) 100 MCG/2ML IJ SOLN
25.0000 ug | INTRAMUSCULAR | Status: DC | PRN
  Administered 2017-08-25 (×2): 50 ug via INTRAVENOUS

## 2017-08-25 MED ORDER — MIDAZOLAM HCL 2 MG/2ML IJ SOLN
INTRAMUSCULAR | Status: AC
  Administered 2017-08-25: 2 mg
  Filled 2017-08-25: qty 2

## 2017-08-25 MED ORDER — MENTHOL 3 MG MT LOZG: 1.0000 | LOZENGE | OROMUCOSAL | Status: DC | PRN

## 2017-08-25 MED ORDER — TRANEXAMIC ACID 1000 MG/10ML IV SOLN
1000.0000 mg | Freq: Once | INTRAVENOUS | Status: AC
  Administered 2017-08-25: 1000 mg via INTRAVENOUS
  Filled 2017-08-25: qty 1100

## 2017-08-25 MED ORDER — CHLORHEXIDINE GLUCONATE 4 % EX LIQD: 60.0000 mL | Freq: Once | CUTANEOUS | Status: DC

## 2017-08-25 MED ORDER — MIDAZOLAM HCL 2 MG/2ML IJ SOLN
INTRAMUSCULAR | Status: AC
  Filled 2017-08-25: qty 2

## 2017-08-25 MED ORDER — OXYCODONE HCL 5 MG PO TABS
5.0000 mg | ORAL_TABLET | ORAL | Status: DC | PRN
  Administered 2017-08-26 – 2017-08-27 (×3): 5 mg via ORAL
  Filled 2017-08-25 (×5): qty 1

## 2017-08-25 MED ORDER — ACETAMINOPHEN 500 MG PO TABS
1000.0000 mg | ORAL_TABLET | Freq: Four times a day (QID) | ORAL | Status: AC
  Administered 2017-08-25 – 2017-08-26 (×4): 1000 mg via ORAL
  Filled 2017-08-25 (×4): qty 2

## 2017-08-25 MED ORDER — METOCLOPRAMIDE HCL 5 MG PO TABS: 5.0000 mg | ORAL_TABLET | Freq: Three times a day (TID) | ORAL | Status: DC | PRN

## 2017-08-25 MED ORDER — SODIUM CHLORIDE 0.9 % IJ SOLN
INTRAMUSCULAR | Status: AC
  Filled 2017-08-25: qty 50

## 2017-08-25 MED ORDER — DEXAMETHASONE SODIUM PHOSPHATE 10 MG/ML IJ SOLN
10.0000 mg | Freq: Once | INTRAMUSCULAR | Status: AC
  Administered 2017-08-26: 10 mg via INTRAVENOUS
  Filled 2017-08-25: qty 1

## 2017-08-25 MED ORDER — LEVOTHYROXINE SODIUM 88 MCG PO TABS
88.0000 ug | ORAL_TABLET | Freq: Every day | ORAL | Status: DC
  Administered 2017-08-26 – 2017-08-28 (×3): 88 ug via ORAL
  Filled 2017-08-25 (×3): qty 1

## 2017-08-25 MED ORDER — ONDANSETRON HCL 4 MG/2ML IJ SOLN
INTRAMUSCULAR | Status: AC
  Filled 2017-08-25: qty 2

## 2017-08-25 MED ORDER — PHENYLEPHRINE HCL 10 MG/ML IJ SOLN
INTRAMUSCULAR | Status: DC | PRN
  Administered 2017-08-25: 40 ug/min via INTRAVENOUS

## 2017-08-25 MED ORDER — SODIUM CHLORIDE 0.9 % IV SOLN
INTRAVENOUS | Status: DC
  Administered 2017-08-25 – 2017-08-26 (×2): via INTRAVENOUS

## 2017-08-25 MED ORDER — LORATADINE 10 MG PO TABS
10.0000 mg | ORAL_TABLET | Freq: Every day | ORAL | Status: DC
  Administered 2017-08-26 – 2017-08-28 (×3): 10 mg via ORAL
  Filled 2017-08-25 (×3): qty 1

## 2017-08-25 MED ORDER — STERILE WATER FOR IRRIGATION IR SOLN
Status: DC | PRN
  Administered 2017-08-25: 2000 mL

## 2017-08-25 MED ORDER — SODIUM CHLORIDE 0.9 % IJ SOLN
INTRAMUSCULAR | Status: DC | PRN
  Administered 2017-08-25: 60 mL

## 2017-08-25 MED ORDER — FLEET ENEMA 7-19 GM/118ML RE ENEM: 1.0000 | ENEMA | Freq: Once | RECTAL | Status: DC | PRN

## 2017-08-25 MED ORDER — CEFAZOLIN SODIUM-DEXTROSE 2-4 GM/100ML-% IV SOLN
2.0000 g | Freq: Four times a day (QID) | INTRAVENOUS | Status: AC
  Administered 2017-08-25 (×2): 2 g via INTRAVENOUS
  Filled 2017-08-25 (×2): qty 100

## 2017-08-25 MED ORDER — ACETAMINOPHEN 10 MG/ML IV SOLN
1000.0000 mg | Freq: Once | INTRAVENOUS | Status: AC
  Administered 2017-08-25: 1000 mg via INTRAVENOUS
  Filled 2017-08-25: qty 100

## 2017-08-25 MED ORDER — RIVAROXABAN 10 MG PO TABS
10.0000 mg | ORAL_TABLET | Freq: Every day | ORAL | Status: DC
  Administered 2017-08-26 – 2017-08-28 (×3): 10 mg via ORAL
  Filled 2017-08-25 (×3): qty 1

## 2017-08-25 MED ORDER — ONDANSETRON HCL 4 MG PO TABS: 4.0000 mg | ORAL_TABLET | Freq: Four times a day (QID) | ORAL | Status: DC | PRN

## 2017-08-25 MED ORDER — HYDROMORPHONE HCL 1 MG/ML IJ SOLN
1.0000 mg | INTRAMUSCULAR | Status: DC | PRN
  Administered 2017-08-25: 1 mg via INTRAVENOUS
  Filled 2017-08-25: qty 1

## 2017-08-25 MED ORDER — BUPIVACAINE LIPOSOME 1.3 % IJ SUSP
INTRAMUSCULAR | Status: DC | PRN
  Administered 2017-08-25: 20 mL

## 2017-08-25 MED ORDER — ONDANSETRON HCL 4 MG/2ML IJ SOLN: 4.0000 mg | Freq: Four times a day (QID) | INTRAMUSCULAR | Status: DC | PRN

## 2017-08-25 MED ORDER — CEFAZOLIN SODIUM-DEXTROSE 2-4 GM/100ML-% IV SOLN
2.0000 g | INTRAVENOUS | Status: AC
  Administered 2017-08-25: 2 g via INTRAVENOUS
  Filled 2017-08-25: qty 100

## 2017-08-25 MED ORDER — PROPOFOL 500 MG/50ML IV EMUL
INTRAVENOUS | Status: DC | PRN
  Administered 2017-08-25: 50 ug/kg/min via INTRAVENOUS

## 2017-08-25 MED ORDER — GABAPENTIN 300 MG PO CAPS
300.0000 mg | ORAL_CAPSULE | Freq: Once | ORAL | Status: DC
  Filled 2017-08-25: qty 1

## 2017-08-25 MED ORDER — METOCLOPRAMIDE HCL 5 MG/ML IJ SOLN: 5.0000 mg | Freq: Three times a day (TID) | INTRAMUSCULAR | Status: DC | PRN

## 2017-08-25 MED ORDER — FENTANYL CITRATE (PF) 100 MCG/2ML IJ SOLN
INTRAMUSCULAR | Status: AC
  Filled 2017-08-25: qty 2

## 2017-08-25 SURGICAL SUPPLY — 50 items
BAG DECANTER FOR FLEXI CONT (MISCELLANEOUS) ×2 IMPLANT
BAG SPEC THK2 15X12 ZIP CLS (MISCELLANEOUS) ×1
BAG ZIPLOCK 12X15 (MISCELLANEOUS) ×2 IMPLANT
BANDAGE ACE 6X5 VEL STRL LF (GAUZE/BANDAGES/DRESSINGS) ×2 IMPLANT
BLADE SAG 18X100X1.27 (BLADE) ×2 IMPLANT
BLADE SAW SGTL 11.0X1.19X90.0M (BLADE) ×2 IMPLANT
BOWL SMART MIX CTS (DISPOSABLE) ×2 IMPLANT
CAPT KNEE TOTAL 3 ATTUNE ×1 IMPLANT
CEMENT HV SMART SET (Cement) ×4 IMPLANT
COVER SURGICAL LIGHT HANDLE (MISCELLANEOUS) ×2 IMPLANT
CUFF TOURN SGL QUICK 34 (TOURNIQUET CUFF) ×2
CUFF TRNQT CYL 34X4X40X1 (TOURNIQUET CUFF) ×1 IMPLANT
DECANTER SPIKE VIAL GLASS SM (MISCELLANEOUS) ×2 IMPLANT
DRAPE U-SHAPE 47X51 STRL (DRAPES) ×2 IMPLANT
DRSG ADAPTIC 3X8 NADH LF (GAUZE/BANDAGES/DRESSINGS) ×2 IMPLANT
DRSG PAD ABDOMINAL 8X10 ST (GAUZE/BANDAGES/DRESSINGS) ×2 IMPLANT
DURAPREP 26ML APPLICATOR (WOUND CARE) ×2 IMPLANT
ELECT REM PT RETURN 15FT ADLT (MISCELLANEOUS) ×2 IMPLANT
EVACUATOR 1/8 PVC DRAIN (DRAIN) ×2 IMPLANT
GAUZE SPONGE 4X4 12PLY STRL (GAUZE/BANDAGES/DRESSINGS) ×2 IMPLANT
GLOVE BIO SURGEON STRL SZ7.5 (GLOVE) IMPLANT
GLOVE BIO SURGEON STRL SZ8 (GLOVE) ×2 IMPLANT
GLOVE BIOGEL PI IND STRL 6.5 (GLOVE) IMPLANT
GLOVE BIOGEL PI IND STRL 8 (GLOVE) ×1 IMPLANT
GLOVE BIOGEL PI INDICATOR 6.5 (GLOVE)
GLOVE BIOGEL PI INDICATOR 8 (GLOVE) ×1
GLOVE SURG SS PI 6.5 STRL IVOR (GLOVE) IMPLANT
GOWN STRL REUS W/TWL LRG LVL3 (GOWN DISPOSABLE) ×2 IMPLANT
GOWN STRL REUS W/TWL XL LVL3 (GOWN DISPOSABLE) IMPLANT
HANDPIECE INTERPULSE COAX TIP (DISPOSABLE) ×2
IMMOBILIZER KNEE 20 (SOFTGOODS) ×2
IMMOBILIZER KNEE 20 THIGH 36 (SOFTGOODS) ×1 IMPLANT
MANIFOLD NEPTUNE II (INSTRUMENTS) ×2 IMPLANT
NS IRRIG 1000ML POUR BTL (IV SOLUTION) ×2 IMPLANT
PACK TOTAL KNEE CUSTOM (KITS) ×2 IMPLANT
PAD ABD 8X10 STRL (GAUZE/BANDAGES/DRESSINGS) ×1 IMPLANT
PADDING CAST COTTON 6X4 STRL (CAST SUPPLIES) ×4 IMPLANT
POSITIONER SURGICAL ARM (MISCELLANEOUS) ×2 IMPLANT
SET HNDPC FAN SPRY TIP SCT (DISPOSABLE) ×1 IMPLANT
STRIP CLOSURE SKIN 1/2X4 (GAUZE/BANDAGES/DRESSINGS) ×3 IMPLANT
SUT MNCRL AB 4-0 PS2 18 (SUTURE) ×2 IMPLANT
SUT STRATAFIX 0 PDS 27 VIOLET (SUTURE) ×2
SUT VIC AB 2-0 CT1 27 (SUTURE) ×6
SUT VIC AB 2-0 CT1 TAPERPNT 27 (SUTURE) ×3 IMPLANT
SUTURE STRATFX 0 PDS 27 VIOLET (SUTURE) ×1 IMPLANT
SYR 30ML LL (SYRINGE) ×4 IMPLANT
TRAY FOLEY W/METER SILVER 16FR (SET/KITS/TRAYS/PACK) ×2 IMPLANT
WATER STERILE IRR 1000ML POUR (IV SOLUTION) ×4 IMPLANT
WRAP KNEE MAXI GEL POST OP (GAUZE/BANDAGES/DRESSINGS) ×2 IMPLANT
YANKAUER SUCT BULB TIP 10FT TU (MISCELLANEOUS) ×2 IMPLANT

## 2017-08-25 NOTE — Anesthesia Preprocedure Evaluation (Addendum)
Anesthesia Evaluation  Patient identified by MRN, date of birth, ID band Patient awake    Reviewed: Allergy & Precautions, NPO status , Patient's Chart, lab work & pertinent test results  History of Anesthesia Complications Negative for: history of anesthetic complications  Airway Mallampati: I  TM Distance: >3 FB Neck ROM: Full    Dental  (+) Dental Advisory Given, Partial Upper   Pulmonary sleep apnea ,    Pulmonary exam normal        Cardiovascular hypertension, Pt. on medications Normal cardiovascular exam     Neuro/Psych PSYCHIATRIC DISORDERS Anxiety Depression negative neurological ROS     GI/Hepatic Neg liver ROS, GERD  Medicated and Controlled,Crohn's    Endo/Other    Renal/GU negative Renal ROS     Musculoskeletal   Abdominal   Peds  Hematology   Anesthesia Other Findings   Reproductive/Obstetrics                            Anesthesia Physical  Anesthesia Plan  ASA: III  Anesthesia Plan: MAC   Post-op Pain Management:    Induction:   PONV Risk Score and Plan: 2 and Ondansetron and Propofol infusion  Airway Management Planned: Natural Airway, Simple Face Mask and Nasal Cannula  Additional Equipment:   Intra-op Plan:   Post-operative Plan:   Informed Consent: I have reviewed the patients History and Physical, chart, labs and discussed the procedure including the risks, benefits and alternatives for the proposed anesthesia with the patient or authorized representative who has indicated his/her understanding and acceptance.   Dental advisory given  Plan Discussed with: CRNA, Surgeon and Anesthesiologist  Anesthesia Plan Comments:        Anesthesia Quick Evaluation

## 2017-08-25 NOTE — Progress Notes (Signed)
Assisted Dr. Tobias Alexander with right, ultrasound guided, adductor canal block. Side rails up, monitors on throughout procedure. See vital signs in flow sheet. Tolerated Procedure well.

## 2017-08-25 NOTE — Anesthesia Procedure Notes (Addendum)
Spinal  Patient location during procedure: OR Start time: 08/25/2017 11:04 AM End time: 08/25/2017 11:14 AM Staffing Anesthesiologist: Duane Boston, MD Performed: anesthesiologist  Preanesthetic Checklist Completed: patient identified, surgical consent, pre-op evaluation, timeout performed, IV checked, risks and benefits discussed and monitors and equipment checked Spinal Block Patient position: sitting Prep: DuraPrep Patient monitoring: cardiac monitor, continuous pulse ox and blood pressure Approach: midline Location: L2-3 Injection technique: single-shot Needle Needle type: Pencan  Needle gauge: 24 G Needle length: 9 cm Additional Notes Functioning IV was confirmed and monitors were applied. Sterile prep and drape, including hand hygiene and sterile gloves were used. The patient was positioned and the spine was prepped. The skin was anesthetized with lidocaine.  Free flow of clear CSF was obtained prior to injecting local anesthetic into the CSF.  The spinal needle aspirated freely following injection.  The needle was carefully withdrawn.  The patient tolerated the procedure well.

## 2017-08-25 NOTE — Discharge Instructions (Addendum)
Dr. Gaynelle Arabian Total Joint Specialist [facility] [address], [place], [place] [address] [phone]  TOTAL KNEE REPLACEMENT POSTOPERATIVE DIRECTIONS  Knee Rehabilitation, Guidelines Following Surgery  Results after knee surgery are often greatly improved when you follow the exercise, range of motion and muscle strengthening exercises prescribed by your doctor. Safety measures are also important to protect the knee from further injury. Any time any of these exercises cause you to have increased pain or swelling in your knee joint, decrease the amount until you are comfortable again and slowly increase them. If you have problems or questions, call your caregiver or physical therapist for advice.   HOME CARE INSTRUCTIONS  Remove items at home which could result in a fall. This includes throw rugs or furniture in walking pathways.   ICE to the affected knee every three hours for 30 minutes at a time and then as needed for pain and swelling.  Continue to use ice on the knee for pain and swelling from surgery. You may notice swelling that will progress down to the foot and ankle.  This is normal after surgery.  Elevate the leg when you are not up walking on it.    Continue to use the breathing machine which will help keep your temperature down.  It is common for your temperature to cycle up and down following surgery, especially at night when you are not up moving around and exerting yourself.  The breathing machine keeps your lungs expanded and your temperature down.  Do not place pillow under knee, focus on keeping the knee straight while resting  DIET You may resume your previous home diet once your are discharged from the hospital.  DRESSING / WOUND CARE / SHOWERING You may shower 3 days after surgery, but keep the wounds dry during showering.  You may use an occlusive plastic wrap (Press'n Seal for example), NO SOAKING/SUBMERGING IN THE BATHTUB.  If the bandage gets  wet, change with a clean dry gauze.  If the incision gets wet, pat the wound dry with a clean towel. You may start showering once you are discharged home but do not submerge the incision under water. Just pat the incision dry and apply a dry gauze dressing on daily. Change the surgical dressing daily and reapply a dry dressing each time.  ACTIVITY Walk with your walker as instructed. Use walker as long as suggested by your caregivers. Avoid periods of inactivity such as sitting longer than an hour when not asleep. This helps prevent blood clots.  You may resume a sexual relationship in one month or when given the OK by your doctor.  You may return to work once you are cleared by your doctor.  Do not drive a car for 6 weeks or until released by you surgeon.  Do not drive while taking narcotics.  WEIGHT BEARING Weight bearing as tolerated with assist device (walker, cane, etc) as directed, use it as long as suggested by your surgeon or therapist, typically at least 4-6 weeks.  POSTOPERATIVE CONSTIPATION PROTOCOL Constipation - defined medically as fewer than three stools per week and severe constipation as less than one stool per week.  One of the most common issues patients have following surgery is constipation.  Even if you have a regular bowel pattern at home, your normal regimen is likely to be disrupted due to multiple reasons following surgery.  Combination of anesthesia, postoperative narcotics, change in appetite and fluid intake all can affect your bowels.  In order to avoid complications following surgery, here are some recommendations in order to help you during your recovery period.  Colace (docusate) - Pick up an over-the-counter form of Colace or another stool softener and take twice a day as long as you are requiring postoperative pain medications.  Take with a full glass of water daily.  If you experience loose stools or diarrhea, hold the colace until you stool forms back up.  If  your symptoms do not get better within 1 week or if they get worse, check with your doctor.  Dulcolax (bisacodyl) - Pick up over-the-counter and take as directed by the product packaging as needed to assist with the movement of your bowels.  Take with a full glass of water.  Use this product as needed if not relieved by Colace only.   MiraLax (polyethylene glycol) - Pick up over-the-counter to have on hand.  MiraLax is a solution that will increase the amount of water in your bowels to assist with bowel movements.  Take as directed and can mix with a glass of water, juice, soda, coffee, or tea.  Take if you go more than two days without a movement. Do not use MiraLax more than once per day. Call your doctor if you are still constipated or irregular after using this medication for 7 days in a row.  If you continue to have problems with postoperative constipation, please contact the office for further assistance and recommendations.  If you experience "the worst abdominal pain ever" or develop nausea or vomiting, please contact the office immediatly for further recommendations for treatment.  ITCHING  If you experience itching with your medications, try taking only a single pain pill, or even half a pain pill at a time.  You can also use Benadryl over the counter for itching or also to help with sleep.   TED HOSE STOCKINGS Wear the elastic stockings on both legs for three weeks following surgery during the day but you may remove then at night for sleeping.  MEDICATIONS See your medication summary on the After Visit Summary that the nursing staff will review with you prior to discharge.  You may have some home medications which will be placed on hold until you complete the course of blood thinner medication.  It is important for you to complete the blood thinner medication as prescribed by your surgeon.  Continue your approved medications as instructed at time of discharge.  PRECAUTIONS If you  experience chest pain or shortness of breath - call 911 immediately for transfer to the hospital emergency department.  If you develop a fever greater that 101 F, purulent drainage from wound, increased redness or drainage from wound, foul odor from the wound/dressing, or calf pain - CONTACT YOUR SURGEON.                                                   FOLLOW-UP APPOINTMENTS Make sure you keep all of your appointments after your operation with your surgeon and caregivers. You should call the office at the above phone number and make an appointment for approximately two weeks after the date of your surgery or on the date instructed by your surgeon outlined in the "After Visit Summary".   RANGE OF MOTION AND STRENGTHENING EXERCISES  Rehabilitation of the knee is important following a knee injury or  an operation. After just a few days of immobilization, the muscles of the thigh which control the knee become weakened and shrink (atrophy). Knee exercises are designed to build up the tone and strength of the thigh muscles and to improve knee motion. Often times heat used for twenty to thirty minutes before working out will loosen up your tissues and help with improving the range of motion but do not use heat for the first two weeks following surgery. These exercises can be done on a training (exercise) mat, on the floor, on a table or on a bed. Use what ever works the best and is most comfortable for you Knee exercises include:  Leg Lifts - While your knee is still immobilized in a splint or cast, you can do straight leg raises. Lift the leg to 60 degrees, hold for 3 sec, and slowly lower the leg. Repeat 10-20 times 2-3 times daily. Perform this exercise against resistance later as your knee gets better.  Quad and Hamstring Sets - Tighten up the muscle on the front of the thigh (Quad) and hold for 5-10 sec. Repeat this 10-20 times hourly. Hamstring sets are done by pushing the foot backward against an object  and holding for 5-10 sec. Repeat as with quad sets.   Leg Slides: Lying on your back, slowly slide your foot toward your buttocks, bending your knee up off the floor (only go as far as is comfortable). Then slowly slide your foot back down until your leg is flat on the floor again.  Angel Wings: Lying on your back spread your legs to the side as far apart as you can without causing discomfort.  A rehabilitation program following serious knee injuries can speed recovery and prevent re-injury in the future due to weakened muscles. Contact your doctor or a physical therapist for more information on knee rehabilitation.   IF YOU ARE TRANSFERRED TO A SKILLED REHAB FACILITY If the patient is transferred to a skilled rehab facility following release from the hospital, a list of the current medications will be sent to the facility for the patient to continue.  When discharged from the skilled rehab facility, please have the facility set up the patient's [place] prior to being released. Also, the skilled facility will be responsible for providing the patient with their medications at time of release from the facility to include their pain medication, the muscle relaxants, and their blood thinner medication. If the patient is still at the rehab facility at time of the two week follow up appointment, the skilled rehab facility will also need to assist the patient in arranging follow up appointment in our office and any transportation needs.  MAKE SURE YOU:  Understand these instructions.  Get help right away if you are not doing well or get worse.    Pick up stool softner and laxative for home use following surgery while on pain medications. Do not submerge incision under water. Please use good hand washing techniques while changing dressing each day. May shower starting three days after surgery. Please use a clean towel to pat the incision dry following showers. Continue to use ice for  pain and swelling after surgery. Do not use any lotions or creams on the incision until instructed by your surgeon.  Take Xarelto for two and a half more weeks following discharge from the hospital, then discontinue Xarelto. Once the patient has completed the Xarelto, they may resume the 81 mg Aspirin.    Information on  my medicine - XARELTO (Rivaroxaban)  Why was Xarelto prescribed for you? Xarelto was prescribed for you to reduce the risk of blood clots forming after orthopedic surgery. The medical term for these abnormal blood clots is venous thromboembolism (VTE).  What do you need to know about xarelto ? Take your Xarelto ONCE DAILY at the same time every day. You may take it either with or without food.  If you have difficulty swallowing the tablet whole, you may crush it and mix in applesauce just prior to taking your dose.  Take Xarelto exactly as prescribed by your doctor and DO NOT stop taking Xarelto without talking to the doctor who prescribed the medication.  Stopping without other VTE prevention medication to take the place of Xarelto may increase your risk of developing a clot.  After discharge, you should have regular check-up appointments with your healthcare provider that is prescribing your Xarelto.    What do you do if you miss a dose? If you miss a dose, take it as soon as you remember on the same day then continue your regularly scheduled once daily regimen the next day. Do not take two doses of Xarelto on the same day.   Important Safety Information A possible side effect of Xarelto is bleeding. You should call your healthcare provider right away if you experience any of the following: ? Bleeding from an injury or your nose that does not stop. ? Unusual colored urine (red or dark brown) or unusual colored stools (red or black). ? Unusual bruising for unknown reasons. ? A serious fall or if you hit your head (even if there is no bleeding).  Some  medicines may interact with Xarelto and might increase your risk of bleeding while on Xarelto. To help avoid this, consult your healthcare provider or pharmacist prior to using any new prescription or non-prescription medications, including herbals, vitamins, non-steroidal anti-inflammatory drugs (NSAIDs) and supplements.  This website has more information on Xarelto: [email or web address]

## 2017-08-25 NOTE — Anesthesia Procedure Notes (Signed)
Procedure Name: MAC Date/Time: 08/25/2017 11:06 AM Performed by: West Pugh, CRNA Pre-anesthesia Checklist: Patient identified, Emergency Drugs available, Suction available, Patient being monitored and Timeout performed Patient Re-evaluated:Patient Re-evaluated prior to induction Oxygen Delivery Method: Simple face mask Placement Confirmation: positive ETCO2 Dental Injury: Teeth and Oropharynx as per pre-operative assessment

## 2017-08-25 NOTE — Anesthesia Procedure Notes (Signed)
Anesthesia Regional Block: Adductor canal block   Pre-Anesthetic Checklist: ,, timeout performed, Correct Patient, Correct Site, Correct Laterality, Correct Procedure, Correct Position, site marked, Risks and benefits discussed,  Surgical consent,  Pre-op evaluation,  At surgeon's request and post-op pain management  Laterality: Right  Prep: chloraprep       Needles:  Injection technique: Single-shot  Needle Type: Stimulator Needle - 80     Needle Length: 10cm  Needle Gauge: 21     Additional Needles:   Narrative:  Start time: 08/25/2017 10:31 AM End time: 08/25/2017 10:41 AM Injection made incrementally with aspirations every 5 mL.  Performed by: Personally

## 2017-08-25 NOTE — Transfer of Care (Signed)
Immediate Anesthesia Transfer of Care Note  Patient: Jessica [name]  Procedure(s) Performed: TOTAL RIGHT KNEE ARTHROPLASTY (Right Knee)  Patient Location: PACU  Anesthesia Type:Spinal and MAC combined with regional for post-op pain  Level of Consciousness: awake, alert , oriented and patient cooperative  Airway & Oxygen Therapy: Patient Spontanous Breathing and Patient connected to face mask oxygen  Post-op Assessment: Report given to RN and Post -op Vital signs reviewed and stable  Post vital signs: Reviewed and stable  Last Vitals:  Vitals:   08/25/17 1036 08/25/17 1040  BP:  110/75  Pulse: 67 73  Resp: (!) 8 12  Temp:    SpO2: 98% 98%    Last Pain:  Vitals:   08/25/17 0954  TempSrc:   PainSc: 8          Complications: No apparent anesthesia complications

## 2017-08-25 NOTE — Op Note (Signed)
OPERATIVE REPORT-TOTAL KNEE ARTHROPLASTY   Pre-operative diagnosis- Osteoarthritis  Right knee(s)  Post-operative diagnosis- Osteoarthritis Right knee(s)  Procedure-  Right  Total Knee Arthroplasty (Depuy Attune)  Surgeon- Dione Plover. Broedy Osbourne, MD  Assistant- Ardeen Jourdain, PA-C   Anesthesia-  Adductor canal block and spinal  EBL-50 mL   Drains Hemovac  Tourniquet time-  Total Tourniquet Time Documented: Thigh (Right) - 40 minutes Total: Thigh (Right) - 40 minutes     Complications- None  Condition-PACU - hemodynamically stable.   Brief Clinical Note  Jessica [name] is a 67 y.o. year old female with end stage OA of her right knee post- tibial plateau fracture with progressively worsening pain and dysfunction. She has constant pain, with activity and at rest and significant functional deficits with difficulties even with ADLs. She has had extensive non-op management including analgesics, injections of cortisone and viscosupplements, and home exercise program, but remains in significant pain with significant dysfunction.Radiographs show bone on bone arthritis lateral and patellofemoral. She presents now for right Total Knee Arthroplasty.    Procedure in detail---   The patient is brought into the operating room and positioned supine on the operating table. After successful administration of  Adductor canal block and spinal,   a tourniquet is placed high on the  Right thigh(s) and the lower extremity is prepped and draped in the usual sterile fashion. Time out is performed by the operating team and then the  Right lower extremity is wrapped in Esmarch, knee flexed and the tourniquet inflated to 300 mmHg.       A midline incision is made with a ten blade through the subcutaneous tissue to the level of the extensor mechanism. A fresh blade is used to make a medial parapatellar arthrotomy. Soft tissue over the proximal medial tibia is subperiosteally elevated to the joint line with a  knife and into the semimembranosus bursa with a Cobb elevator. Soft tissue over the proximal lateral tibia is elevated with attention being paid to avoiding the patellar tendon on the tibial tubercle. The patella is everted, knee flexed 90 degrees and the ACL and PCL are removed. Findings are bone on bone lateral and patellofemoral with large lateral osteophytes.        The drill is used to create a starting hole in the distal femur and the canal is thoroughly irrigated with sterile saline to remove the fatty contents. The 5 degree Right  valgus alignment guide is placed into the femoral canal and the distal femoral cutting block is pinned to remove 9 mm off the distal femur. Resection is made with an oscillating saw.      The tibia is subluxed forward and the menisci are removed. The extramedullary alignment guide is placed referencing proximally at the medial aspect of the tibial tubercle and distally along the second metatarsal axis and tibial crest. The block is pinned to remove 4m off the more deficient lateral  side. Resection is made with an oscillating saw. There is a fair amount of fibrous tissue in the lateral tibial plateau but the cortical rim is stable and completely intact. Size 4is the most appropriate size for the tibia and the proximal tibia is prepared with the modular drill and keel punch for that size. It has a very stable fit.      The femoral sizing guide is placed and size 5 is most appropriate. Rotation is marked off the epicondylar axis and confirmed by creating a rectangular flexion gap at 90 degrees. The size  5 cutting block is pinned in this rotation and the anterior, posterior and chamfer cuts are made with the oscillating saw. The intercondylar block is then placed and that cut is made.      Trial size 4 tibial component, trial size 5 posterior stabilized femur and a 12  mm posterior stabilized rotating platform insert trial is placed. Full extension is achieved with excellent  varus/valgus and anterior/posterior balance throughout full range of motion. The patella is everted and thickness measured to be 21  mm. Free hand resection is taken to 12 mm, a 35 template is placed, lug holes are drilled, trial patella is placed, and it tracks normally. Osteophytes are removed off the posterior femur with the trial in place. All trials are removed and the cut bone surfaces prepared with pulsatile lavage. Cement is mixed and once ready for implantation, the size 4 tibial implant, size  5 posterior stabilized femoral component, and the size 35 patella are cemented in place and the patella is held with the clamp. The trial insert is placed and the knee held in full extension. The Exparel (20 ml mixed with 60 ml saline) is injected into the extensor mechanism, posterior capsule, medial and lateral gutters and subcutaneous tissues.  All extruded cement is removed and once the cement is hard the permanent 12 mm posterior stabilized rotating platform insert is placed into the tibial tray.      The wound is copiously irrigated with saline solution and the extensor mechanism closed over a hemovac drain with #1 V-loc suture. The tourniquet is released for a total tourniquet time of 40  minutes. Flexion against gravity is 140 degrees and the patella tracks normally. Subcutaneous tissue is closed with 2.0 vicryl and subcuticular with running 4.0 Monocryl. The incision is cleaned and dried and steri-strips and a bulky sterile dressing are applied. The limb is placed into a knee immobilizer and the patient is awakened and transported to recovery in stable condition.      Please note that a surgical assistant was a medical necessity for this procedure in order to perform it in a safe and expeditious manner. Surgical assistant was necessary to retract the ligaments and vital neurovascular structures to prevent injury to them and also necessary for proper positioning of the limb to allow for anatomic placement  of the prosthesis.   Dione Plover Joelle Flessner, MD    08/25/2017, 12:17 PM

## 2017-08-25 NOTE — Interval H&P Note (Signed)
History and Physical Interval Note:  08/25/2017 10:13 AM  Jessica [name]  has presented today for surgery, with the diagnosis of Right knee osteoarthritis  The various methods of treatment have been discussed with the patient and family. After consideration of risks, benefits and other options for treatment, the patient has consented to  Procedure(s): TOTAL RIGHT KNEE ARTHROPLASTY (Right) as a surgical intervention .  The patient's history has been reviewed, patient examined, no change in status, stable for surgery.  I have reviewed the patient's chart and labs.  Questions were answered to the patient's satisfaction.     Pilar Plate Sherrod Toothman

## 2017-08-25 NOTE — Anesthesia Postprocedure Evaluation (Signed)
Anesthesia Post Note  Patient: Jessica [name]  Procedure(s) Performed: TOTAL RIGHT KNEE ARTHROPLASTY (Right Knee)     Patient location during evaluation: PACU Anesthesia Type: MAC and Spinal Level of consciousness: awake and alert Pain management: pain level controlled Vital Signs Assessment: post-procedure vital signs reviewed and stable Respiratory status: spontaneous breathing and respiratory function stable Cardiovascular status: blood pressure returned to baseline and stable Postop Assessment: spinal receding Anesthetic complications: no    Last Vitals:  Vitals:   08/25/17 1430 08/25/17 1445  BP: 114/71 120/73  Pulse: 69 70  Resp: 10 10  Temp:    SpO2: 99% 99%    Last Pain:  Vitals:   08/25/17 1445  TempSrc:   PainSc: 0-No pain   Pain Goal:                 Quintarius Ferns DANIEL

## 2017-08-26 LAB — CBC
HCT: 27.7 % — ABNORMAL LOW (ref 36.0–46.0)
HEMOGLOBIN: 9.7 g/dL — AB (ref 12.0–15.0)
MCH: 32.4 pg (ref 26.0–34.0)
MCHC: 35 g/dL (ref 30.0–36.0)
MCV: 92.6 fL (ref 78.0–100.0)
Platelets: 157 10*3/uL (ref 150–400)
RBC: 2.99 MIL/uL — ABNORMAL LOW (ref 3.87–5.11)
RDW: 12.6 % (ref 11.5–15.5)
WBC: 8.4 10*3/uL (ref 4.0–10.5)

## 2017-08-26 LAB — BASIC METABOLIC PANEL
ANION GAP: 8 (ref 5–15)
BUN: 15 mg/dL (ref 6–20)
CHLORIDE: 106 mmol/L (ref 101–111)
CO2: 24 mmol/L (ref 22–32)
CREATININE: 1.08 mg/dL — AB (ref 0.44–1.00)
Calcium: 8.5 mg/dL — ABNORMAL LOW (ref 8.9–10.3)
GFR calc non Af Amer: 53 mL/min — ABNORMAL LOW (ref >60)
Glucose, Bld: 125 mg/dL — ABNORMAL HIGH (ref 65–99)
POTASSIUM: 4 mmol/L (ref 3.5–5.1)
SODIUM: 138 mmol/L (ref 135–145)

## 2017-08-26 MED ORDER — POLYETHYLENE GLYCOL 3350 17 G PO PACK: 17.0000 g | PACK | Freq: Every day | ORAL | 0 refills | Status: DC | PRN

## 2017-08-26 MED ORDER — DIAZEPAM 5 MG PO TABS: 5.0000 mg | ORAL_TABLET | Freq: Four times a day (QID) | ORAL | 0 refills | Status: DC | PRN

## 2017-08-26 MED ORDER — SODIUM CHLORIDE 0.9 % IV BOLUS (SEPSIS)
250.0000 mL | Freq: Once | INTRAVENOUS | Status: AC
  Administered 2017-08-26: 250 mL via INTRAVENOUS

## 2017-08-26 MED ORDER — RIVAROXABAN 10 MG PO TABS: 10.0000 mg | ORAL_TABLET | Freq: Every day | ORAL | 0 refills | Status: DC

## 2017-08-26 MED ORDER — OXYCODONE HCL 5 MG PO TABS: 5.0000 mg | ORAL_TABLET | ORAL | 0 refills | Status: DC | PRN

## 2017-08-26 NOTE — Discharge Summary (Signed)
Physician Discharge Summary   Patient ID: Jessica [name] MRN: [id number] DOB/AGE: 65-Jan-1952 65 y.o.  Admit date: 08/25/2017 Discharge date: 08/28/2017  Primary Diagnosis:  Osteoarthritis Right knee(s)   Admission Diagnoses:  Past Medical History:  Diagnosis Date  . Anxiety attack   . Arthritis   . Back pain, chronic   . Crohn's colitis ([place])   . Depression (emotion)   . Dysrhythmia   . GERD (gastroesophageal reflux disease)   . Hypertension   . Hypothyroid   . Neuropathy   . Sleep apnea    cpap is not working per patient - needs to find a new doctor - not used since 7/16 per pat   . Vertigo    followed by Dr Wardell Heath- in [place]    Discharge Diagnoses:   Principal Problem:   OA (osteoarthritis) of knee  Estimated body mass index is 30.36 kg/m as calculated from the following:   Height as of this encounter: 5' 2"  (1.575 m).   Weight as of this encounter: 75.3 kg (166 lb).  Procedure:  Procedure(s) (LRB): TOTAL RIGHT KNEE ARTHROPLASTY (Right)   Consults: None  HPI: Jessica [name] is a 67 y.o. year old female with end stage OA of her right knee post- tibial plateau fracture with progressively worsening pain and dysfunction. She has constant pain, with activity and at rest and significant functional deficits with difficulties even with ADLs. She has had extensive non-op management including analgesics, injections of cortisone and viscosupplements, and home exercise program, but remains in significant pain with significant dysfunction.Radiographs show bone on bone arthritis lateral and patellofemoral. She presents now for right Total Knee Arthroplasty.     Laboratory Data: Admission on 08/25/2017  Component Date Value Ref Range Status  . WBC 08/26/2017 8.4  4.0 - 10.5 K/uL Final  . RBC 08/26/2017 2.99* 3.87 - 5.11 MIL/uL Final  . Hemoglobin 08/26/2017 9.7* 12.0 - 15.0 g/dL Final  . HCT 08/26/2017 27.7* 36.0 - 46.0 % Final  . MCV 08/26/2017 92.6  78.0 - 100.0 fL Final  .  MCH 08/26/2017 32.4  26.0 - 34.0 pg Final  . MCHC 08/26/2017 35.0  30.0 - 36.0 g/dL Final  . RDW 08/26/2017 12.6  11.5 - 15.5 % Final  . Platelets 08/26/2017 157  150 - 400 K/uL Final   Performed at [facility], [place] [address] [place], [place] [address]  . Sodium 08/26/2017 138  135 - 145 mmol/L Final  . Potassium 08/26/2017 4.0  3.5 - 5.1 mmol/L Final  . Chloride 08/26/2017 106  101 - 111 mmol/L Final  . CO2 08/26/2017 24  22 - 32 mmol/L Final  . Glucose, Bld 08/26/2017 125* 65 - 99 mg/dL Final  . BUN 08/26/2017 15  6 - 20 mg/dL Final  . Creatinine, Ser 08/26/2017 1.08* 0.44 - 1.00 mg/dL Final  . Calcium 08/26/2017 8.5* 8.9 - 10.3 mg/dL Final  . GFR calc non Af Amer 08/26/2017 53* >60 mL/min Final  . GFR calc Af Amer 08/26/2017 >60  >60 mL/min Final   Comment: (NOTE) The eGFR has been calculated using the CKD EPI equation. This calculation has not been validated in all clinical situations. eGFR's persistently <60 mL/min signify possible Chronic Kidney Disease.   Georgiann Hahn gap 08/26/2017 8  5 - 15 Final   Performed at [facility], [place] [address], [place], [place] [address]  Hospital Outpatient Visit on 08/20/2017  Component Date Value Ref Range Status  . aPTT 08/20/2017 32  24 - 36 seconds Final   Performed at [facility], [place] [address], [place], [place] [address]  . WBC 08/20/2017 5.9  4.0 - 10.5 K/uL Final  . RBC 08/20/2017 3.77* 3.87 - 5.11 MIL/uL Final  . Hemoglobin 08/20/2017 12.0  12.0 - 15.0 g/dL Final  . HCT 08/20/2017 35.0* 36.0 - 46.0 % Final  . MCV 08/20/2017 92.8  78.0 - 100.0 fL Final  . MCH 08/20/2017 31.8  26.0 - 34.0 pg Final  . MCHC 08/20/2017 34.3  30.0 - 36.0 g/dL Final  . RDW 08/20/2017 13.0  11.5 - 15.5 % Final  . Platelets 08/20/2017 196  150 - 400 K/uL Final   Performed at [facility], [place] [address] [place], [place] [address]  . Sodium 08/20/2017 142  135 - 145 mmol/L Final    . Potassium 08/20/2017 4.8  3.5 - 5.1 mmol/L Final  . Chloride 08/20/2017 108  101 - 111 mmol/L Final  . CO2 08/20/2017 24  22 - 32 mmol/L Final  . Glucose, Bld 08/20/2017 95  65 - 99 mg/dL Final  . BUN 08/20/2017 14  6 - 20 mg/dL Final  . Creatinine, Ser 08/20/2017 1.18* 0.44 - 1.00 mg/dL Final  . Calcium 08/20/2017 9.7  8.9 - 10.3 mg/dL Final  . Total Protein 08/20/2017 7.8  6.5 - 8.1 g/dL Final  . Albumin 08/20/2017 4.3  3.5 - 5.0 g/dL Final  . AST 08/20/2017 26  15 - 41 U/L Final  . ALT 08/20/2017 12* 14 - 54 U/L Final  . Alkaline Phosphatase 08/20/2017 122  38 - 126 U/L Final  . Total Bilirubin 08/20/2017 0.8  0.3 - 1.2 mg/dL Final  . GFR calc non Af Amer 08/20/2017 47* >60 mL/min Final  . GFR calc Af Amer 08/20/2017 55* >60 mL/min Final   Comment: (NOTE) The eGFR has been calculated using the CKD EPI equation. This calculation has not been validated in all clinical situations. eGFR's persistently <60 mL/min signify possible Chronic Kidney Disease.   Georgiann Hahn gap 08/20/2017 10  5 - 15 Final   Performed at [facility], [place] [address] [place], [place] [address]  . Prothrombin Time 08/20/2017 12.8  11.4 - 15.2 seconds Final  . INR 08/20/2017 0.97   Final   Performed at [facility], [place] [address], [place], [place] [address]  . ABO/RH(D) 08/20/2017 O POS   Final  . Antibody Screen 08/20/2017 NEG   Final  . Sample Expiration 08/20/2017 08/28/2017   Final  . Extend sample reason 08/20/2017    Final                   Value:NO TRANSFUSIONS OR PREGNANCY IN THE PAST 3 MONTHS Performed at [facility], [place] [address], [place], [place] [address]   . MRSA, PCR 08/20/2017 NEGATIVE  NEGATIVE Final  . Staphylococcus aureus 08/20/2017 NEGATIVE  NEGATIVE Final   Comment: (NOTE) The Xpert SA Assay (FDA approved for NASAL specimens in patients 94 years of age and older), is one component of a comprehensive surveillance program. It is  not intended to diagnose infection nor to guide or monitor treatment. Performed at [facility], [place] [address], [place],  [address]      X-Rays:Dg Lumbar Spine Complete  Result Date: 08/24/2017 CLINICAL DATA:  Pain in back and left hip.  No known injury. EXAM: LUMBAR SPINE - COMPLETE 4+ VIEW COMPARISON:  05/01/2015. FINDINGS: The bones  appear diffusely osteopenic. The vertebral body heights are well preserved. No fracture identified. Mild multi level disc space narrowing and ventral endplate spurring. Most advanced at L5-S1 and T11-T12. IMPRESSION: 1. No acute findings identified. 2. Degenerative disc disease. Electronically Signed   By: Kerby Moors M.D.   On: 08/24/2017 12:33   Dg Hip Unilat W Or Wo Pelvis 2-3 Views Left  Result Date: 08/24/2017 CLINICAL DATA:  Left hip pain EXAM: DG HIP (WITH OR WITHOUT PELVIS) 2-3V LEFT COMPARISON:  None. FINDINGS: Frontal pelvis shows right hip replacement, incompletely visualized. AP and frog-leg lateral views of the left hip show no fracture. No worrisome lytic or sclerotic osseous abnormality. IMPRESSION: Negative. Electronically Signed   By: Misty Stanley M.D.   On: 08/24/2017 12:31    EKG: Orders placed or performed during the hospital encounter of 09/03/16  . EKG 12-Lead  . EKG 12-Lead  . EKG 12-Lead  . EKG 12-Lead  . EKG     Hospital Course: KAELANI KENDRICK is a 67 y.o. who was admitted to [facility]. They were brought to the operating room on 08/25/2017 and underwent Procedure(s): TOTAL RIGHT KNEE ARTHROPLASTY.  Patient tolerated the procedure well and was later transferred to the recovery room and then to the orthopaedic floor for postoperative care.  They were given PO and IV analgesics for pain control following their surgery.  They were given 24 hours of postoperative antibiotics of  Anti-infectives (From admission, onward)   Start     Dose/Rate Route Frequency Ordered Stop   08/25/17 1730  ceFAZolin  (ANCEF) IVPB 2g/100 mL premix     2 g 200 mL/hr over 30 Minutes Intravenous Every 6 hours 08/25/17 1537 08/26/17 0017   08/25/17 0919  ceFAZolin (ANCEF) IVPB 2g/100 mL premix     2 g 200 mL/hr over 30 Minutes Intravenous On call to O.R. 08/25/17 0919 08/25/17 1125     and started on DVT prophylaxis in the form of Xarelto.   PT and OT were ordered for total joint protocol.  Discharge planning consulted to help with postop disposition and equipment needs.  Patient had a rough night on the evening of surgery with pain.  They started to get up OOB with therapy on day one. Hemovac drain was pulled without difficulty.  Continued to work with therapy into day two.  Dressing was changed on day two and the incision was healing well.  By day three, the patient had progressed with therapy and meeting their goals.  Incision was healing well.  Patient was seen in rounds by Dr. Wynelle Link and was ready to go home on day three.   Diet - Cardiac diet Follow up - in 2 weeks Activity - WBAT Disposition - Home Condition Upon Discharge - Stable D/C Meds - See DC Summary DVT Prophylaxis - Xarelto    Discharge Instructions    Call MD / Call 911   Complete by:  As directed    If you experience chest pain or shortness of breath, CALL 911 and be transported to the hospital emergency room.  If you develope a fever above 101 F, pus (white drainage) or increased drainage or redness at the wound, or calf pain, call your surgeon's office.   Change dressing   Complete by:  As directed    Change dressing daily with sterile 4 x 4 inch gauze dressing and apply TED hose. Do not submerge the incision under water.   Constipation Prevention   Complete by:  As directed    Drink plenty of fluids.  Prune juice may be helpful.  You may use a stool softener, such as Colace (over the counter) 100 mg twice a day.  Use MiraLax (over the counter) for constipation as needed.   Diet - low sodium heart healthy   Complete by:  As  directed    Discharge instructions   Complete by:  As directed    Take Xarelto for two and a half more weeks, then discontinue Xarelto. Once the patient has completed the Xarelto, they may resume the 81 mg Aspirin.   Pick up stool softner and laxative for home use following surgery while on pain medications. Do not submerge incision under water. Please use good hand washing techniques while changing dressing each day. May shower starting three days after surgery. Please use a clean towel to pat the incision dry following showers. Continue to use ice for pain and swelling after surgery. Do not use any lotions or creams on the incision until instructed by your surgeon.  Wear both TED hose on both legs during the day every day for three weeks, but may remove the TED hose at night at home.  Postoperative Constipation Protocol  Constipation - defined medically as fewer than three stools per week and severe constipation as less than one stool per week.  One of the most common issues patients have following surgery is constipation.  Even if you have a regular bowel pattern at home, your normal regimen is likely to be disrupted due to multiple reasons following surgery.  Combination of anesthesia, postoperative narcotics, change in appetite and fluid intake all can affect your bowels.  In order to avoid complications following surgery, here are some recommendations in order to help you during your recovery period.  Colace (docusate) - Pick up an over-the-counter form of Colace or another stool softener and take twice a day as long as you are requiring postoperative pain medications.  Take with a full glass of water daily.  If you experience loose stools or diarrhea, hold the colace until you stool forms back up.  If your symptoms do not get better within 1 week or if they get worse, check with your doctor.  Dulcolax (bisacodyl) - Pick up over-the-counter and take as directed by the product  packaging as needed to assist with the movement of your bowels.  Take with a full glass of water.  Use this product as needed if not relieved by Colace only.   MiraLax (polyethylene glycol) - Pick up over-the-counter to have on hand.  MiraLax is a solution that will increase the amount of water in your bowels to assist with bowel movements.  Take as directed and can mix with a glass of water, juice, soda, coffee, or tea.  Take if you go more than two days without a movement. Do not use MiraLax more than once per day. Call your doctor if you are still constipated or irregular after using this medication for 7 days in a row.  If you continue to have problems with postoperative constipation, please contact the office for further assistance and recommendations.  If you experience "the worst abdominal pain ever" or develop nausea or vomiting, please contact the office immediatly for further recommendations for treatment.   Do not put a pillow under the knee. Place it under the heel.   Complete by:  As directed    Do not sit on low chairs, stoools or toilet seats, as it may be  difficult to get up from low surfaces   Complete by:  As directed    Driving restrictions   Complete by:  As directed    No driving until released by the physician.   Increase activity slowly as tolerated   Complete by:  As directed    Lifting restrictions   Complete by:  As directed    No lifting until released by the physician.   Patient may shower   Complete by:  As directed    You may shower without a dressing once there is no drainage.  Do not wash over the wound.  If drainage remains, do not shower until drainage stops.   TED hose   Complete by:  As directed    Use stockings (TED hose) for 3 weeks on both leg(s).  You may remove them at night for sleeping.   Weight bearing as tolerated   Complete by:  As directed      Allergies as of 08/26/2017      Reactions   Morphine And Related Hives, Itching   May cause blood  pressure to drop   Robaxin [methocarbamol] Itching   (PT WILL RESUME TAKING TO SEE IF THIS IS HER ALLERGY)   Sulfa Antibiotics Itching   ALL SULFA DRUGS      Medication List    STOP taking these medications   aspirin EC 81 MG tablet   B-12 5000 MCG Caps   HYDROcodone-acetaminophen 5-325 MG tablet Commonly known as:  NORCO/VICODIN   meloxicam 15 MG tablet Commonly known as:  MOBIC   mesalamine 500 MG CR capsule Commonly known as:  PENTASA   methylPREDNISolone 4 MG Tbpk tablet Commonly known as:  MEDROL DOSEPAK   metoCLOPramide 5 MG tablet Commonly known as:  REGLAN   multivitamin with minerals Tabs tablet   naproxen sodium 220 MG tablet Commonly known as:  ALEVE   oxyCODONE-acetaminophen 5-325 MG tablet Commonly known as:  PERCOCET/ROXICET   Vitamin D (Ergocalciferol) 50000 units Caps capsule Commonly known as:  DRISDOL     TAKE these medications   bisacodyl 10 MG suppository Commonly known as:  DULCOLAX Place 1 suppository (10 mg total) rectally daily as needed for moderate constipation.   diazepam 5 MG tablet Commonly known as:  VALIUM Take 1 tablet (5 mg total) by mouth every 6 (six) hours as needed for muscle spasms.   diphenoxylate-atropine 2.5-0.025 MG tablet Commonly known as:  LOMOTIL Take 1 tablet by mouth 3 (three) times daily as needed for diarrhea or loose stools. What changed:    how much to take  when to take this   docusate sodium 100 MG capsule Commonly known as:  COLACE Take 1 capsule (100 mg total) by mouth 2 (two) times daily. What changed:    when to take this  reasons to take this   EQ RANITIDINE PO Take 1 tablet by mouth daily as needed (heartburn).   fluticasone 50 MCG/ACT nasal spray Commonly known as:  FLONASE Place 1 spray into the nose 2 (two) times daily.   gabapentin 800 MG tablet Commonly known as:  NEURONTIN Take 800 mg by mouth 3 (three) times daily.   levothyroxine 88 MCG tablet Commonly known as:   SYNTHROID, LEVOTHROID Take 88 mcg by mouth daily before breakfast.   lisinopril 20 MG tablet Commonly known as:  PRINIVIL,ZESTRIL Take 20 mg by mouth daily.   loratadine 10 MG tablet Commonly known as:  CLARITIN Take 10 mg by mouth daily.  oxyCODONE 5 MG immediate release tablet Commonly known as:  Oxy IR/ROXICODONE Take 1 tablet (5 mg total) by mouth every 4 (four) hours as needed for moderate pain or severe pain.   pantoprazole 40 MG tablet Commonly known as:  PROTONIX Take 1 tablet (40 mg total) by mouth daily.   polyethylene glycol packet Commonly known as:  MIRALAX / GLYCOLAX Take 17 g by mouth daily as needed for mild constipation.   rivaroxaban 10 MG Tabs tablet Commonly known as:  XARELTO Take 1 tablet (10 mg total) by mouth daily with breakfast. Start taking on:  08/27/2017   sertraline 100 MG tablet Commonly known as:  ZOLOFT Take 1 tablet (100 mg total) by mouth at bedtime.   sodium phosphate 7-19 GM/118ML Enem Place 133 mLs (1 enema total) rectally once as needed for severe constipation.   XIIDRA 5 % Soln Generic drug:  Lifitegrast Place 1 drop into the right eye daily as needed (dry eye).            Discharge Care Instructions  (From admission, onward)        Start     Ordered   08/26/17 0000  Weight bearing as tolerated     08/26/17 2044   08/26/17 0000  Change dressing    Comments:  Change dressing daily with sterile 4 x 4 inch gauze dressing and apply TED hose. Do not submerge the incision under water.   08/26/17 2044     Follow-up Information    Gaynelle Arabian, MD. Schedule an appointment as soon as possible for a visit on 09/09/2017.   Specialty:  Orthopedic Surgery Contact information: [address] [place] [address] [phone]           Signed: [name], PA-C Orthopaedic Surgery 08/26/2017, 8:45 PM

## 2017-08-26 NOTE — Progress Notes (Signed)
Physical Therapy Treatment Patient Details Name: Jessica [name] MRN: [id number] DOB: 01/20/1951 Today's Date: 08/26/2017    History of Present Illness 67 yo female s/p R TKA 08/25/17. Hx of tib plateau fx 02/2016, hardware removal 08/2016, bil shoulder impingement    PT Comments    Progressing with mobility. Plan is for possible d/c home on tomorrow. Will practice stair negotiation prior to d/c.    Follow Up Recommendations  Follow surgeon's recommendation for DC plan and follow-up therapies     Equipment Recommendations  None recommended by PT    Recommendations for Other Services       Precautions / Restrictions Precautions Precautions: Fall;Knee Required Braces or Orthoses: Knee Immobilizer - Right Knee Immobilizer - Right: Discontinue once straight leg raise with < 10 degree lag Restrictions Weight Bearing Restrictions: No RLE Weight Bearing: Weight bearing as tolerated    Mobility  Bed Mobility Overal bed mobility: Needs Assistance Bed Mobility: Supine to Sit;Sit to Supine     Supine to sit: Min guard Sit to supine: Min assist;HOB elevated   General bed mobility comments: small amount of assist for R LE onto bed.   Transfers Overall transfer level: Needs assistance Equipment used: Rolling walker (2 wheeled) Transfers: Sit to/from Stand Sit to Stand: Min assist         General transfer comment: small amount of assist steady. VCs hand/LE placement.   Ambulation/Gait Ambulation/Gait assistance: Min assist Ambulation Distance (Feet): 150 Feet Assistive device: Rolling walker (2 wheeled) Gait Pattern/deviations: Step-to pattern     General Gait Details: Pt was able to transition to step through as distance progressed. She is unsteady still at times. VCs safety.   Stairs            Wheelchair Mobility    Modified Rankin (Stroke Patients Only)       Balance Overall balance assessment: Needs assistance         Standing balance support:  Bilateral upper extremity supported Standing balance-Leahy Scale: Poor                              Cognition Arousal/Alertness: Awake/alert Behavior During Therapy: WFL for tasks assessed/performed Overall Cognitive Status: Within Functional Limits for tasks assessed                                        Exercises   General Comments        Pertinent Vitals/Pain Pain Assessment: 0-10 Pain Score: 5  Pain Location: R knee Pain Descriptors / Indicators: Sore Pain Intervention(s): Limited activity within patient's tolerance;Repositioned    Home Living Family/patient expects to be discharged to:: Private residence Living Arrangements: Spouse/significant other Available Help at Discharge: Family Type of Home: House Home Access: Stairs to enter Entrance Stairs-Rails: Can reach both;Left;Right Home Layout: One level Home Equipment: Environmental consultant - 2 wheels;Bedside commode;Wheelchair - manual;Cane - single point      Prior Function Level of Independence: Independent with assistive device(s)          PT Goals (current goals can now be found in the care plan section) Acute Rehab PT Goals Patient Stated Goal: regain PLOF. Less pain.  PT Goal Formulation: With patient/family Time For Goal Achievement: 09/09/17 Potential to Achieve Goals: Good Progress towards PT goals: Progressing toward goals    Frequency    7X/week  PT Plan Current plan remains appropriate    Co-evaluation              AM-PAC PT "6 Clicks" Daily Activity  Outcome Measure  Difficulty turning over in bed (including adjusting bedclothes, sheets and blankets)?: A Little Difficulty moving from lying on back to sitting on the side of the bed? : A Little Difficulty sitting down on and standing up from a chair with arms (e.g., wheelchair, bedside commode, etc,.)?: Unable Help needed moving to and from a bed to chair (including a wheelchair)?: A Little Help needed walking  in hospital room?: A Little Help needed climbing 3-5 steps with a railing? : A Little 6 Click Score: 16    End of Session Equipment Utilized During Treatment: Gait belt;Right knee immobilizer Activity Tolerance: Patient tolerated treatment well Patient left: in bed;with call bell/phone within reach;with family/visitor present   PT Visit Diagnosis: Difficulty in walking, not elsewhere classified (R26.2);Other abnormalities of gait and mobility (R26.89);Pain Pain - Right/Left: Right Pain - part of body: Knee     Time: 1352-1409 PT Time Calculation (min) (ACUTE ONLY): 17 min  Charges:  $Gait Training: 8-22 mins                    G Codes:          Weston Anna, MPT Pager: [phone]

## 2017-08-26 NOTE — Plan of Care (Signed)
Plan of care reviewed and discussed with patient.

## 2017-08-26 NOTE — Progress Notes (Signed)
OT Cancellation Note  Patient Details Name: DELARA SHEPHEARD MRN: [id number] DOB: 26-Sep-1950   Cancelled Treatment:    Reason Eval/Treat Not Completed: OT screened, no needs identified, will sign off  Crucita Lacorte 08/26/2017, 12:04 PM  Lesle Chris, OTR/L [phone] 08/26/2017

## 2017-08-26 NOTE — Progress Notes (Signed)
Spoke with patient at bedside. Confirmed plan for OP PT, already arranged. Has RW and 3n1. [phone]

## 2017-08-26 NOTE — Evaluation (Signed)
Physical Therapy Evaluation Patient Details Name: Jessica [name] MRN: [id number] DOB: February 24, 1951 Today's Date: 08/26/2017   History of Present Illness  67 yo female s/p R TKA 08/25/17. Hx of tib plateau fx 02/2016, hardware removal 08/2016, bil shoulder impingement  Clinical Impression  On eval, pt walked ~100 feet with a RW. Pain rated 5/10 with activity. Will follow and progress activity as tolerated. Plan is for OP PT per chart.     Follow Up Recommendations Follow surgeon's recommendation for DC plan and follow-up therapies    Equipment Recommendations  None recommended by PT    Recommendations for Other Services       Precautions / Restrictions Precautions Precautions: Fall;Knee Required Braces or Orthoses: Knee Immobilizer - Right Restrictions Weight Bearing Restrictions: No RLE Weight Bearing: Weight bearing as tolerated      Mobility  Bed Mobility               General bed mobility comments: oob in recliner  Transfers Overall transfer level: Needs assistance Equipment used: Rolling walker (2 wheeled) Transfers: Sit to/from Stand Sit to Stand: Min assist         General transfer comment: VCs safety, technique, hand/LE placement. Assist to steady.   Ambulation/Gait Ambulation/Gait assistance: Min assist Ambulation Distance (Feet): 100 Feet Assistive device: Rolling walker (2 wheeled) Gait Pattern/deviations: Step-to pattern;Step-through pattern;Decreased stride length     General Gait Details: Pt was able to transition to step through as distance progressed. She is unsteady still. LOB x 1 when she released a hand from walker handle. VCs safety, sequence.   Stairs            Wheelchair Mobility    Modified Rankin (Stroke Patients Only)       Balance Overall balance assessment: Needs assistance         Standing balance support: Bilateral upper extremity supported Standing balance-Leahy Scale: Poor                                Pertinent Vitals/Pain Pain Assessment: 0-10 Pain Score: 5  Pain Location: R knee Pain Descriptors / Indicators: Sore Pain Intervention(s): Monitored during session;Repositioned;Ice applied    Home Living Family/patient expects to be discharged to:: Private residence Living Arrangements: Spouse/significant other Available Help at Discharge: Family Type of Home: House Home Access: Stairs to enter Entrance Stairs-Rails: Can reach both;Left;Right Entrance Stairs-Number of Steps: 3 Home Layout: One level Home Equipment: [place] - 2 wheels;Bedside commode;Wheelchair - manual;Cane - single point      Prior Function Level of Independence: Independent with assistive device(s)               Hand Dominance        Extremity/Trunk Assessment        Lower Extremity Assessment Lower Extremity Assessment: Generalized weakness RLE Deficits / Details: s/p R TKA    Cervical / Trunk Assessment Cervical / Trunk Assessment: Normal  Communication   Communication: No difficulties  Cognition Arousal/Alertness: Awake/alert Behavior During Therapy: WFL for tasks assessed/performed Overall Cognitive Status: Within Functional Limits for tasks assessed                                        General Comments      Exercises Total Joint Exercises Ankle Circles/Pumps: AROM;Both;10 reps;Supine Quad Sets: AROM;Both;10 reps;Supine Heel Slides: AAROM;Right;10 reps;Supine  Hip ABduction/ADduction: AAROM;AROM;Right;10 reps;Supine Straight Leg Raises: AROM;Right;10 reps;Supine Goniometric ROM: ~10-65 degrees   Assessment/Plan    PT Assessment Patient needs continued PT services  PT Problem List Decreased strength;Decreased range of motion;Decreased balance;Decreased mobility;Decreased activity tolerance;Pain;Decreased knowledge of use of DME       PT Treatment Interventions DME instruction;Gait training;Functional mobility training;Therapeutic activities;Balance  training;Patient/family education;Therapeutic exercise    PT Goals (Current goals can be found in the Care Plan section)  Acute Rehab PT Goals Patient Stated Goal: regain PLOF. Less pain.  PT Goal Formulation: With patient/family Time For Goal Achievement: 09/09/17 Potential to Achieve Goals: Good    Frequency 7X/week   Barriers to discharge        Co-evaluation               AM-PAC PT "6 Clicks" Daily Activity  Outcome Measure Difficulty turning over in bed (including adjusting bedclothes, sheets and blankets)?: A Lot Difficulty moving from lying on back to sitting on the side of the bed? : A Lot Difficulty sitting down on and standing up from a chair with arms (e.g., wheelchair, bedside commode, etc,.)?: Unable Help needed moving to and from a bed to chair (including a wheelchair)?: A Little Help needed walking in hospital room?: A Little Help needed climbing 3-5 steps with a railing? : A Little 6 Click Score: 14    End of Session Equipment Utilized During Treatment: Gait belt;Right knee immobilizer Activity Tolerance: Patient tolerated treatment well Patient left: in chair;with call bell/phone within reach;with family/visitor present   PT Visit Diagnosis: Muscle weakness (generalized) (M62.81)    Time: 1005-1040 PT Time Calculation (min) (ACUTE ONLY): 35 min   Charges:   PT Evaluation $PT Eval Low Complexity: 1 Low PT Treatments $Gait Training: 8-22 mins   PT G Codes:         Weston Anna, MPT Pager: [phone]

## 2017-08-26 NOTE — Progress Notes (Signed)
   Subjective: 1 Day Post-Op Procedure(s) (LRB): TOTAL RIGHT KNEE ARTHROPLASTY (Right) Patient reports pain as mild.   Patient seen in rounds for Dr. Wynelle Link. Patient is well, but has had some minor complaints of pain in the knee, requiring pain medications We will start therapy today.  Plan is to go Home after hospital stay.  Objective: Vital signs in last 24 hours: Temp:  [97.3 F (36.3 C)-97.9 F (36.6 C)] 97.6 F (36.4 C) (02/19 1323) Pulse Rate:  [62-73] 62 (02/19 1323) Resp:  [14-16] 15 (02/19 1323) BP: (106-119)/(62-79) 119/71 (02/19 1323) SpO2:  [97 %-100 %] 99 % (02/19 1323)  Intake/Output from previous day:  Intake/Output Summary (Last 24 hours) at 08/26/2017 2015 Last data filed at 08/26/2017 1952 Gross per 24 hour  Intake 2190 ml  Output 3540 ml  Net -1350 ml    Intake/Output this shift: Total I/O In: -  Out: 400 [Urine:400]  Labs: Recent Labs    08/26/17 0549  HGB 9.7*   Recent Labs    08/26/17 0549  WBC 8.4  RBC 2.99*  HCT 27.7*  PLT 157   Recent Labs    08/26/17 0549  NA 138  K 4.0  CL 106  CO2 24  BUN 15  CREATININE 1.08*  GLUCOSE 125*  CALCIUM 8.5*   No results for input(s): LABPT, INR in the last 72 hours.  EXAM General - Patient is Alert, Appropriate and Oriented Extremity - Neurovascular intact Sensation intact distally Intact pulses distally Dressing - dressing C/D/I Motor Function - intact, moving foot and toes well on exam.  Hemovac pulled without difficulty.  Past Medical History:  Diagnosis Date  . Anxiety attack   . Arthritis   . Back pain, chronic   . Crohn's colitis ([place])   . Depression (emotion)   . Dysrhythmia   . GERD (gastroesophageal reflux disease)   . Hypertension   . Hypothyroid   . Neuropathy   . Sleep apnea    cpap is not working per patient - needs to find a new doctor - not used since 7/16 per pat   . Vertigo    followed by Dr Wardell Heath- in [place]     Assessment/Plan: 1 Day Post-Op Procedure(s)  (LRB): TOTAL RIGHT KNEE ARTHROPLASTY (Right) Principal Problem:   OA (osteoarthritis) of knee  Estimated body mass index is 30.36 kg/m as calculated from the following:   Height as of this encounter: 5' 2"  (1.575 m).   Weight as of this encounter: 75.3 kg (166 lb). Advance diet Up with therapy Plan for discharge tomorrow  DVT Prophylaxis - Xarelto Weight-Bearing as tolerated to right leg D/C O2 and Pulse OX and try on Room Air  Jessica Muslim, PA-C Orthopaedic Surgery 08/26/2017, 8:15 PM

## 2017-08-27 LAB — CBC
HCT: 26.9 % — ABNORMAL LOW (ref 36.0–46.0)
Hemoglobin: 9.3 g/dL — ABNORMAL LOW (ref 12.0–15.0)
MCH: 31.8 pg (ref 26.0–34.0)
MCHC: 34.6 g/dL (ref 30.0–36.0)
MCV: 92.1 fL (ref 78.0–100.0)
PLATELETS: 148 10*3/uL — AB (ref 150–400)
RBC: 2.92 MIL/uL — AB (ref 3.87–5.11)
RDW: 12.4 % (ref 11.5–15.5)
WBC: 7.5 10*3/uL (ref 4.0–10.5)

## 2017-08-27 LAB — BASIC METABOLIC PANEL
Anion gap: 7 (ref 5–15)
BUN: 17 mg/dL (ref 6–20)
CALCIUM: 8.7 mg/dL — AB (ref 8.9–10.3)
CO2: 23 mmol/L (ref 22–32)
Chloride: 109 mmol/L (ref 101–111)
Creatinine, Ser: 1.03 mg/dL — ABNORMAL HIGH (ref 0.44–1.00)
GFR calc Af Amer: >60 mL/min (ref >60)
GFR, EST NON AFRICAN AMERICAN: 56 mL/min — AB (ref >60)
Glucose, Bld: 92 mg/dL (ref 65–99)
Potassium: 4.5 mmol/L (ref 3.5–5.1)
SODIUM: 139 mmol/L (ref 135–145)

## 2017-08-27 NOTE — Progress Notes (Signed)
Physical Therapy Treatment Patient Details Name: Jessica [name] MRN: [id number] DOB: Feb 18, 1951 Today's Date: 08/27/2017    History of Present Illness 67 yo female s/p R TKA 08/25/17. Hx of tib plateau fx 02/2016, hardware removal 08/2016, bil shoulder impingement    PT Comments    Progressing with mobility. Pt c/o increased pain on today compared to yesterday. Reviewed/practiced exercises, gait training, and stair training. All education completed. Issued HEP for pt to perform 2x/day until she begins OP PT. Okay to d/c from PT standpoint-made RN aware.     Follow Up Recommendations  Follow surgeon's recommendation for DC plan and follow-up therapies     Equipment Recommendations  None recommended by PT    Recommendations for Other Services       Precautions / Restrictions Precautions Precautions: Knee Required Braces or Orthoses: Knee Immobilizer - Right Knee Immobilizer - Right: Discontinue once straight leg raise with < 10 degree lag Restrictions Weight Bearing Restrictions: No RLE Weight Bearing: Weight bearing as tolerated    Mobility  Bed Mobility Overal bed mobility: Needs Assistance Bed Mobility: Supine to Sit;Sit to Supine     Supine to sit: Min guard Sit to supine: Min assist   General bed mobility comments: small amount of assist for R LE onto bed.   Transfers Overall transfer level: Needs assistance Equipment used: Rolling walker (2 wheeled) Transfers: Sit to/from Stand Sit to Stand: Min assist         General transfer comment: small amount of assist steady. VCs hand/LE placement.   Ambulation/Gait Ambulation/Gait assistance: Min assist Ambulation Distance (Feet): 60 Feet Assistive device: Rolling walker (2 wheeled) Gait Pattern/deviations: Step-to pattern     General Gait Details: small amount of assist to steady intermittently. slow gait speed. VCs safety.   Stairs            Wheelchair Mobility    Modified Rankin (Stroke Patients  Only)       Balance                                            Cognition Arousal/Alertness: Awake/alert Behavior During Therapy: WFL for tasks assessed/performed Overall Cognitive Status: Within Functional Limits for tasks assessed                                        Exercises Total Joint Exercises Ankle Circles/Pumps: AROM;Both;10 reps;Supine Quad Sets: AROM;Both;10 reps;Supine Heel Slides: AAROM;Right;10 reps;Supine Hip ABduction/ADduction: AAROM;AROM;Right;10 reps;Supine Straight Leg Raises: AROM;Right;10 reps;Supine Goniometric ROM: ~10-60 degrees    General Comments        Pertinent Vitals/Pain Pain Assessment: 0-10 Pain Score: 8  Pain Location: R knee Pain Descriptors / Indicators: Sore;Tender;Aching;Grimacing Pain Intervention(s): Monitored during session;Repositioned;Ice applied    Home Living                      Prior Function            PT Goals (current goals can now be found in the care plan section) Progress towards PT goals: Progressing toward goals    Frequency    7X/week      PT Plan Current plan remains appropriate    Co-evaluation              AM-PAC PT "6  Clicks" Daily Activity  Outcome Measure  Difficulty turning over in bed (including adjusting bedclothes, sheets and blankets)?: A Little Difficulty moving from lying on back to sitting on the side of the bed? : A Little Difficulty sitting down on and standing up from a chair with arms (e.g., wheelchair, bedside commode, etc,.)?: Unable Help needed moving to and from a bed to chair (including a wheelchair)?: A Little Help needed walking in hospital room?: A Little Help needed climbing 3-5 steps with a railing? : A Little 6 Click Score: 16    End of Session Equipment Utilized During Treatment: Gait belt;Right knee immobilizer Activity Tolerance: Patient tolerated treatment well Patient left: in bed;with call bell/phone within  reach;with family/visitor present   PT Visit Diagnosis: Difficulty in walking, not elsewhere classified (R26.2);Other abnormalities of gait and mobility (R26.89);Pain Pain - Right/Left: Right Pain - part of body: Knee     Time: 0920-0956 PT Time Calculation (min) (ACUTE ONLY): 36 min  Charges:  $Gait Training: 8-22 mins $Therapeutic Exercise: 8-22 mins                    G Codes:         Weston Anna, MPT Pager: [phone]

## 2017-08-27 NOTE — Plan of Care (Signed)
Care plan reviewed and discussed with patient.

## 2017-08-27 NOTE — Progress Notes (Signed)
   Subjective: 2 Days Post-Op Procedure(s) (LRB): TOTAL RIGHT KNEE ARTHROPLASTY (Right) Jessica [name] reports pain as mild and moderate.   Jessica [name] seen in rounds for Dr. Wynelle Link. Drowsy,sedated, falling asleep during visit. Jessica [name] is having problems with pain in the knee, requiring pain medications Jessica [name] is not ready to go home at this time.  Objective: Vital signs in last 24 hours: Temp:  [97.4 F (36.3 C)-98 F (36.7 C)] 97.8 F (36.6 C) (02/20 0604) Pulse Rate:  [62-74] 74 (02/20 0604) Resp:  [15-16] 16 (02/20 0604) BP: (106-154)/(68-83) 154/68 (02/20 0604) SpO2:  [99 %-100 %] 99 % (02/20 0604)  Intake/Output from previous day:  Intake/Output Summary (Last 24 hours) at 08/27/2017 0805 Last data filed at 08/27/2017 0608 Gross per 24 hour  Intake 1312.5 ml  Output 3800 ml  Net -2487.5 ml    Intake/Output this shift: No intake/output data recorded.  Labs: Recent Labs    08/26/17 0549 08/27/17 0553  HGB 9.7* 9.3*   Recent Labs    08/26/17 0549 08/27/17 0553  WBC 8.4 7.5  RBC 2.99* 2.92*  HCT 27.7* 26.9*  PLT 157 148*   Recent Labs    08/26/17 0549 08/27/17 0553  NA 138 139  K 4.0 4.5  CL 106 109  CO2 24 23  BUN 15 17  CREATININE 1.08* 1.03*  GLUCOSE 125* 92  CALCIUM 8.5* 8.7*   No results for input(s): LABPT, INR in the last 72 hours.  EXAM: General - Jessica [name] is quite sedated on exam Extremity - Neurovascular intact Sensation intact distally Incision - clean, dry, no drainage Motor Function - intact, moving foot and toes well on exam.   Assessment/Plan: 2 Days Post-Op Procedure(s) (LRB): TOTAL RIGHT KNEE ARTHROPLASTY (Right) Procedure(s) (LRB): TOTAL RIGHT KNEE ARTHROPLASTY (Right) Past Medical History:  Diagnosis Date  . Anxiety attack   . Arthritis   . Back pain, chronic   . Crohn's colitis ([place])   . Depression (emotion)   . Dysrhythmia   . GERD (gastroesophageal reflux disease)   . Hypertension   . Hypothyroid   . Neuropathy   .  Sleep apnea    cpap is not working per Jessica [name] - needs to find a new doctor - not used since 7/16 per pat   . Vertigo    followed by Dr Wardell Heath- in [place]    Principal Problem:   OA (osteoarthritis) of knee  Estimated body mass index is 30.36 kg/m as calculated from the following:   Height as of this encounter: 5' 2"  (1.575 m).   Weight as of this encounter: 75.3 kg (166 lb). Up with therapy   [name], PA-C Orthopaedic Surgery 08/27/2017, 8:05 AM

## 2017-08-28 LAB — CBC
HCT: 26.2 % — ABNORMAL LOW (ref 36.0–46.0)
Hemoglobin: 8.9 g/dL — ABNORMAL LOW (ref 12.0–15.0)
MCH: 31.6 pg (ref 26.0–34.0)
MCHC: 34 g/dL (ref 30.0–36.0)
MCV: 92.9 fL (ref 78.0–100.0)
Platelets: 147 10*3/uL — ABNORMAL LOW (ref 150–400)
RBC: 2.82 MIL/uL — AB (ref 3.87–5.11)
RDW: 12.8 % (ref 11.5–15.5)
WBC: 6 10*3/uL (ref 4.0–10.5)

## 2017-08-28 NOTE — Plan of Care (Signed)
reviewed

## 2017-08-28 NOTE — Care Management Important Message (Signed)
Important Message  Patient Details  Name: Jessica [name] MRN: [id number] Date of Birth: 01/21/1951   Medicare Important Message Given:  Yes    Kerin Salen 08/28/2017, 10:18 AMImportant Message  Patient Details  Name: Jessica [name] MRN: [id number] Date of Birth: 1950-10-30   Medicare Important Message Given:  Yes    Kerin Salen 08/28/2017, 10:18 AM

## 2017-08-28 NOTE — Progress Notes (Signed)
   Subjective: 3 Days Post-Op Procedure(s) (LRB): TOTAL RIGHT KNEE ARTHROPLASTY (Right) Patient reports pain as mild and moderate.   Patient seen in rounds by Dr. Wynelle Link. Patient is well, but has had some minor complaints of pain in the knee, requiring pain medications Patient is ready to go home today. She is more alert today and back to her baseline  Objective: Vital signs in last 24 hours: Temp:  [98.4 F (36.9 C)-99 F (37.2 C)] 98.5 F (36.9 C) (02/21 0619) Pulse Rate:  [88-91] 88 (02/21 0619) Resp:  [15-16] 15 (02/21 0619) BP: (121-156)/(62-88) 156/74 (02/21 0619) SpO2:  [97 %-98 %] 98 % (02/21 0619)  Intake/Output from previous day:  Intake/Output Summary (Last 24 hours) at 08/28/2017 0809 Last data filed at 08/28/2017 0619 Gross per 24 hour  Intake 600 ml  Output 700 ml  Net -100 ml    Intake/Output this shift: No intake/output data recorded.  Labs: Recent Labs    08/26/17 0549 08/27/17 0553 08/28/17 0531  HGB 9.7* 9.3* 8.9*   Recent Labs    08/27/17 0553 08/28/17 0531  WBC 7.5 6.0  RBC 2.92* 2.82*  HCT 26.9* 26.2*  PLT 148* 147*   Recent Labs    08/26/17 0549 08/27/17 0553  NA 138 139  K 4.0 4.5  CL 106 109  CO2 24 23  BUN 15 17  CREATININE 1.08* 1.03*  GLUCOSE 125* 92  CALCIUM 8.5* 8.7*   No results for input(s): LABPT, INR in the last 72 hours.  EXAM: General - Patient is Alert Extremity - Neurovascular intact Sensation intact distally Intact pulses distally Incision - clean, dry, no drainage Motor Function - intact, moving foot and toes well on exam.   Assessment/Plan: 3 Days Post-Op Procedure(s) (LRB): TOTAL RIGHT KNEE ARTHROPLASTY (Right) Procedure(s) (LRB): TOTAL RIGHT KNEE ARTHROPLASTY (Right) Past Medical History:  Diagnosis Date  . Anxiety attack   . Arthritis   . Back pain, chronic   . Crohn's colitis ([place])   . Depression (emotion)   . Dysrhythmia   . GERD (gastroesophageal reflux disease)   . Hypertension   .  Hypothyroid   . Neuropathy   . Sleep apnea    cpap is not working per patient - needs to find a new doctor - not used since 7/16 per pat   . Vertigo    followed by Dr Wardell Heath- in [place]    Principal Problem:   OA (osteoarthritis) of knee  Estimated body mass index is 30.36 kg/m as calculated from the following:   Height as of this encounter: 5' 2"  (1.575 m).   Weight as of this encounter: 75.3 kg (166 lb). Advance diet Up with therapy Diet - Cardiac diet Follow up - in 2 weeks Activity - WBAT Disposition - Home Condition Upon Discharge - Stable D/C Meds - See DC Summary DVT Prophylaxis - Xarelto  [name], PA-C Orthopaedic Surgery 08/28/2017, 8:09 AM

## 2017-08-28 NOTE — Progress Notes (Signed)
Physical Therapy Treatment Patient Details Name: Jessica [name] MRN: [id number] DOB: [birth year] Today's Date: 08/28/2017    History of Present Illness 67 yo female s/p R TKA 08/25/17. Hx of tib plateau fx 02/2016, hardware removal 08/2016, bil shoulder impingement    PT Comments    POD # 2 pm session Pt sleepy but less groggy.  Assisted to bathroom then amb in hallway.  Instructed spouse on safe handling.  Spouse stated "this ain't my first rodeo".  Spouse assisted pt with prior surgeries.  Practiced stairs, pt was able to perform 4 steps B rails.  Pt has met goals to D/C to home with spouse assist.  Advised spouse, pt is still unsteady and should not walk on her own.   Follow Up Recommendations  Follow surgeon's recommendation for DC plan and follow-up therapies     Equipment Recommendations  None recommended by PT    Recommendations for Other Services       Precautions / Restrictions Precautions Precautions: Knee Precaution Comments: reviewed no pillow under knee  Restrictions Weight Bearing Restrictions: No RLE Weight Bearing: Weight bearing as tolerated    Mobility  Bed Mobility Overal bed mobility: Needs Assistance Bed Mobility: Supine to Sit     Supine to sit: Mod assist     General bed mobility comments: required increased assist and time.  Pt very sleepy/groggy/delayed.    Transfers Overall transfer level: Needs assistance   Transfers: Sit to/from Stand;Stand Pivot Transfers Sit to Stand: Min assist;Mod assist Stand pivot transfers: Min assist;Mod assist       General transfer comment: mon assist slightly groggy  Ambulation/Gait Ambulation/Gait assistance: Min assist Ambulation Distance (Feet): 18 Feet Assistive device: Rolling walker (2 wheeled) Gait Pattern/deviations: Step-to pattern;Drifts right/left;Trunk flexed Gait velocity: decreased x 3   General Gait Details: very unsteady gait, slow, delayed requiring increased assist to safely navigate  walker around bed.     Stairs Stairs: Yes  4 steps B rails and 50% VC's on proper sequencing and safety.  Spouse present for "hands on" instruction         Wheelchair Mobility    Modified Rankin (Stroke Patients Only)       Balance                                            Cognition Arousal/Alertness: Awake/alert Behavior During Therapy: WFL for tasks assessed/performed Overall Cognitive Status: Within Functional Limits for tasks assessed                                        Exercises      General Comments        Pertinent Vitals/Pain Pain Assessment: 0-10 Pain Score: 3  Pain Location: R knee Pain Descriptors / Indicators: Sore;Tender;Aching;Grimacing Pain Intervention(s): Monitored during session;Ice applied;Repositioned;Premedicated before session    Home Living                      Prior Function            PT Goals (current goals can now be found in the care plan section) Progress towards PT goals: Progressing toward goals    Frequency    7X/week      PT Plan Current plan remains appropriate  Co-evaluation              AM-PAC PT "6 Clicks" Daily Activity  Outcome Measure  Difficulty turning over in bed (including adjusting bedclothes, sheets and blankets)?: A Little Difficulty moving from lying on back to sitting on the side of the bed? : A Little Difficulty sitting down on and standing up from a chair with arms (e.g., wheelchair, bedside commode, etc,.)?: Unable Help needed moving to and from a bed to chair (including a wheelchair)?: A Little Help needed walking in hospital room?: A Little Help needed climbing 3-5 steps with a railing? : A Little 6 Click Score: 16    End of Session Equipment Utilized During Treatment: Gait belt Activity Tolerance: Other (comment)(pt very groggy/sleepy/delayed (meds?) ) Patient left: in chair;with chair alarm set;with call bell/phone within  reach Nurse Communication: (pt will need another PT session prior to D/C ddue to poor performance) PT Visit Diagnosis: Difficulty in walking, not elsewhere classified (R26.2);Other abnormalities of gait and mobility (R26.89);Pain Pain - Right/Left: Right Pain - part of body: Knee     Time: 1430-1455 PT Time Calculation (min) (ACUTE ONLY): 25 min  Charges:  $Gait Training: 8-22 mins $Therapeutic Activity: 8-22 mins                    G Codes:       {Kayler Rise  PTA [facility]  Acute  Rehab Pager      [phone]

## 2017-08-28 NOTE — Progress Notes (Signed)
Physical Therapy Treatment Patient Details Name: Jessica [name] MRN: [id number] DOB: 06-26-1951 Today's Date: 08/28/2017    History of Present Illness 67 yo female s/p R TKA 08/25/17. Hx of tib plateau fx 02/2016, hardware removal 08/2016, bil shoulder impingement    PT Comments    POD # 3 Pt VERY groggy/sleepy during session and unable to complete stairs goal.  Assisted OOB to amb to bathroom required increased assist and near fall when turning to step back to toilet.  Amb a decreased distance due to same.  Attempted stairs however unable to even complete on step up.  Returned to room and notfied RN, pt will need another PT session to meet goals for safe D/C to home today.   Follow Up Recommendations  Follow surgeon's recommendation for DC plan and follow-up therapies     Equipment Recommendations  None recommended by PT    Recommendations for Other Services       Precautions / Restrictions Precautions Precautions: Knee Precaution Comments: reviewed no pillow under knee  Restrictions Weight Bearing Restrictions: No RLE Weight Bearing: Weight bearing as tolerated    Mobility  Bed Mobility Overal bed mobility: Needs Assistance Bed Mobility: Supine to Sit     Supine to sit: Mod assist     General bed mobility comments: required increased assist and time.  Pt very sleepy/groggy/delayed.    Transfers Overall transfer level: Needs assistance   Transfers: Sit to/from Stand;Stand Pivot Transfers Sit to Stand: Min assist;Mod assist Stand pivot transfers: Min assist;Mod assist       General transfer comment: Mod Assist for safety esp with turns in bathroom.  slow/groggy near fall when backing up to toilet.  75% VC's on proper tech and hand placement.   Ambulation/Gait Ambulation/Gait assistance: Min assist Ambulation Distance (Feet): 18 Feet Assistive device: Rolling walker (2 wheeled) Gait Pattern/deviations: Step-to pattern;Drifts right/left;Trunk flexed Gait  velocity: decreased x 3   General Gait Details: very unsteady gait, slow, delayed requiring increased assist to safely navigate walker around bed.     Stairs Stairs: Yes       General stair comments: attempted stairs with spouse however unable to complete.  Pt too groggy and unsteady.  Pt was unable to even complete one step up.  Attempted up backward with walker due to no rails.   Wheelchair Mobility    Modified Rankin (Stroke Patients Only)       Balance                                            Cognition Arousal/Alertness: Awake/alert Behavior During Therapy: WFL for tasks assessed/performed Overall Cognitive Status: Within Functional Limits for tasks assessed                                        Exercises      General Comments        Pertinent Vitals/Pain Pain Assessment: 0-10 Pain Score: 3  Pain Location: R knee Pain Descriptors / Indicators: Sore;Tender;Aching;Grimacing Pain Intervention(s): Monitored during session;Ice applied;Repositioned;Premedicated before session    Home Living                      Prior Function            PT Goals (current  goals can now be found in the care plan section) Progress towards PT goals: Progressing toward goals    Frequency    7X/week      PT Plan Current plan remains appropriate    Co-evaluation              AM-PAC PT "6 Clicks" Daily Activity  Outcome Measure  Difficulty turning over in bed (including adjusting bedclothes, sheets and blankets)?: A Little Difficulty moving from lying on back to sitting on the side of the bed? : A Little Difficulty sitting down on and standing up from a chair with arms (e.g., wheelchair, bedside commode, etc,.)?: Unable Help needed moving to and from a bed to chair (including a wheelchair)?: A Little Help needed walking in hospital room?: A Little Help needed climbing 3-5 steps with a railing? : A Little 6 Click Score:  16    End of Session Equipment Utilized During Treatment: Gait belt Activity Tolerance: Other (comment)(pt very groggy/sleepy/delayed (meds?) ) Patient left: in chair;with chair alarm set;with call bell/phone within reach Nurse Communication: (pt will need another PT session prior to D/C ddue to poor performance) PT Visit Diagnosis: Difficulty in walking, not elsewhere classified (R26.2);Other abnormalities of gait and mobility (R26.89);Pain Pain - Right/Left: Right Pain - part of body: Knee     Time: 1115-1145 PT Time Calculation (min) (ACUTE ONLY): 30 min  Charges:  $Gait Training: 8-22 mins $Therapeutic Activity: 8-22 mins                    G Codes:       {Abegail Kloeppel  PTA [facility]  Acute  Rehab Pager      [phone]

## 2017-09-01 DIAGNOSIS — Z471 Aftercare following joint replacement surgery: Secondary | ICD-10-CM | POA: Diagnosis not present

## 2017-09-01 DIAGNOSIS — R2689 Other abnormalities of gait and mobility: Secondary | ICD-10-CM | POA: Diagnosis not present

## 2017-09-01 DIAGNOSIS — M25661 Stiffness of right knee, not elsewhere classified: Secondary | ICD-10-CM | POA: Diagnosis not present

## 2017-09-01 DIAGNOSIS — Z96651 Presence of right artificial knee joint: Secondary | ICD-10-CM | POA: Diagnosis not present

## 2017-09-01 DIAGNOSIS — M6281 Muscle weakness (generalized): Secondary | ICD-10-CM | POA: Diagnosis not present

## 2017-09-03 DIAGNOSIS — M6281 Muscle weakness (generalized): Secondary | ICD-10-CM | POA: Diagnosis not present

## 2017-09-03 DIAGNOSIS — M25661 Stiffness of right knee, not elsewhere classified: Secondary | ICD-10-CM | POA: Diagnosis not present

## 2017-09-03 DIAGNOSIS — Z713 Dietary counseling and surveillance: Secondary | ICD-10-CM | POA: Diagnosis not present

## 2017-09-03 DIAGNOSIS — Z6830 Body mass index (BMI) 30.0-30.9, adult: Secondary | ICD-10-CM | POA: Diagnosis not present

## 2017-09-03 DIAGNOSIS — Z471 Aftercare following joint replacement surgery: Secondary | ICD-10-CM | POA: Diagnosis not present

## 2017-09-03 DIAGNOSIS — R2689 Other abnormalities of gait and mobility: Secondary | ICD-10-CM | POA: Diagnosis not present

## 2017-09-03 DIAGNOSIS — Z299 Encounter for prophylactic measures, unspecified: Secondary | ICD-10-CM | POA: Diagnosis not present

## 2017-09-03 DIAGNOSIS — Z96651 Presence of right artificial knee joint: Secondary | ICD-10-CM | POA: Diagnosis not present

## 2017-09-03 DIAGNOSIS — R350 Frequency of micturition: Secondary | ICD-10-CM | POA: Diagnosis not present

## 2017-09-04 DIAGNOSIS — M6281 Muscle weakness (generalized): Secondary | ICD-10-CM | POA: Diagnosis not present

## 2017-09-04 DIAGNOSIS — Z471 Aftercare following joint replacement surgery: Secondary | ICD-10-CM | POA: Diagnosis not present

## 2017-09-04 DIAGNOSIS — R2689 Other abnormalities of gait and mobility: Secondary | ICD-10-CM | POA: Diagnosis not present

## 2017-09-04 DIAGNOSIS — M25661 Stiffness of right knee, not elsewhere classified: Secondary | ICD-10-CM | POA: Diagnosis not present

## 2017-09-04 DIAGNOSIS — Z96651 Presence of right artificial knee joint: Secondary | ICD-10-CM | POA: Diagnosis not present

## 2017-09-08 DIAGNOSIS — Z471 Aftercare following joint replacement surgery: Secondary | ICD-10-CM | POA: Diagnosis not present

## 2017-09-08 DIAGNOSIS — M6281 Muscle weakness (generalized): Secondary | ICD-10-CM | POA: Diagnosis not present

## 2017-09-08 DIAGNOSIS — Z96651 Presence of right artificial knee joint: Secondary | ICD-10-CM | POA: Diagnosis not present

## 2017-09-08 DIAGNOSIS — R2689 Other abnormalities of gait and mobility: Secondary | ICD-10-CM | POA: Diagnosis not present

## 2017-09-08 DIAGNOSIS — M25661 Stiffness of right knee, not elsewhere classified: Secondary | ICD-10-CM | POA: Diagnosis not present

## 2017-09-11 DIAGNOSIS — Z96651 Presence of right artificial knee joint: Secondary | ICD-10-CM | POA: Diagnosis not present

## 2017-09-11 DIAGNOSIS — R2689 Other abnormalities of gait and mobility: Secondary | ICD-10-CM | POA: Diagnosis not present

## 2017-09-11 DIAGNOSIS — M25661 Stiffness of right knee, not elsewhere classified: Secondary | ICD-10-CM | POA: Diagnosis not present

## 2017-09-11 DIAGNOSIS — Z471 Aftercare following joint replacement surgery: Secondary | ICD-10-CM | POA: Diagnosis not present

## 2017-09-11 DIAGNOSIS — M6281 Muscle weakness (generalized): Secondary | ICD-10-CM | POA: Diagnosis not present

## 2017-09-12 DIAGNOSIS — R2689 Other abnormalities of gait and mobility: Secondary | ICD-10-CM | POA: Diagnosis not present

## 2017-09-12 DIAGNOSIS — Z96651 Presence of right artificial knee joint: Secondary | ICD-10-CM | POA: Diagnosis not present

## 2017-09-12 DIAGNOSIS — M6281 Muscle weakness (generalized): Secondary | ICD-10-CM | POA: Diagnosis not present

## 2017-09-12 DIAGNOSIS — M25661 Stiffness of right knee, not elsewhere classified: Secondary | ICD-10-CM | POA: Diagnosis not present

## 2017-09-12 DIAGNOSIS — Z471 Aftercare following joint replacement surgery: Secondary | ICD-10-CM | POA: Diagnosis not present

## 2017-09-17 DIAGNOSIS — Z96651 Presence of right artificial knee joint: Secondary | ICD-10-CM | POA: Diagnosis not present

## 2017-09-17 DIAGNOSIS — M6281 Muscle weakness (generalized): Secondary | ICD-10-CM | POA: Diagnosis not present

## 2017-09-17 DIAGNOSIS — R2689 Other abnormalities of gait and mobility: Secondary | ICD-10-CM | POA: Diagnosis not present

## 2017-09-17 DIAGNOSIS — Z471 Aftercare following joint replacement surgery: Secondary | ICD-10-CM | POA: Diagnosis not present

## 2017-09-17 DIAGNOSIS — M25661 Stiffness of right knee, not elsewhere classified: Secondary | ICD-10-CM | POA: Diagnosis not present

## 2017-09-18 DIAGNOSIS — R2689 Other abnormalities of gait and mobility: Secondary | ICD-10-CM | POA: Diagnosis not present

## 2017-09-18 DIAGNOSIS — M6281 Muscle weakness (generalized): Secondary | ICD-10-CM | POA: Diagnosis not present

## 2017-09-18 DIAGNOSIS — Z96651 Presence of right artificial knee joint: Secondary | ICD-10-CM | POA: Diagnosis not present

## 2017-09-18 DIAGNOSIS — Z471 Aftercare following joint replacement surgery: Secondary | ICD-10-CM | POA: Diagnosis not present

## 2017-09-18 DIAGNOSIS — M25661 Stiffness of right knee, not elsewhere classified: Secondary | ICD-10-CM | POA: Diagnosis not present

## 2017-09-22 DIAGNOSIS — R2689 Other abnormalities of gait and mobility: Secondary | ICD-10-CM | POA: Diagnosis not present

## 2017-09-22 DIAGNOSIS — Z96651 Presence of right artificial knee joint: Secondary | ICD-10-CM | POA: Diagnosis not present

## 2017-09-22 DIAGNOSIS — M25661 Stiffness of right knee, not elsewhere classified: Secondary | ICD-10-CM | POA: Diagnosis not present

## 2017-09-22 DIAGNOSIS — M6281 Muscle weakness (generalized): Secondary | ICD-10-CM | POA: Diagnosis not present

## 2017-09-22 DIAGNOSIS — Z471 Aftercare following joint replacement surgery: Secondary | ICD-10-CM | POA: Diagnosis not present

## 2017-09-23 DIAGNOSIS — Z471 Aftercare following joint replacement surgery: Secondary | ICD-10-CM | POA: Diagnosis not present

## 2017-09-23 DIAGNOSIS — M6281 Muscle weakness (generalized): Secondary | ICD-10-CM | POA: Diagnosis not present

## 2017-09-23 DIAGNOSIS — M25661 Stiffness of right knee, not elsewhere classified: Secondary | ICD-10-CM | POA: Diagnosis not present

## 2017-09-23 DIAGNOSIS — Z96651 Presence of right artificial knee joint: Secondary | ICD-10-CM | POA: Diagnosis not present

## 2017-09-23 DIAGNOSIS — R2689 Other abnormalities of gait and mobility: Secondary | ICD-10-CM | POA: Diagnosis not present

## 2017-09-25 DIAGNOSIS — G2581 Restless legs syndrome: Secondary | ICD-10-CM | POA: Diagnosis not present

## 2017-09-25 DIAGNOSIS — M5412 Radiculopathy, cervical region: Secondary | ICD-10-CM | POA: Diagnosis not present

## 2017-09-25 DIAGNOSIS — M545 Low back pain: Secondary | ICD-10-CM | POA: Diagnosis not present

## 2017-09-25 DIAGNOSIS — M5417 Radiculopathy, lumbosacral region: Secondary | ICD-10-CM | POA: Diagnosis not present

## 2017-09-25 DIAGNOSIS — G5603 Carpal tunnel syndrome, bilateral upper limbs: Secondary | ICD-10-CM | POA: Diagnosis not present

## 2017-09-25 DIAGNOSIS — I6529 Occlusion and stenosis of unspecified carotid artery: Secondary | ICD-10-CM | POA: Diagnosis not present

## 2017-09-25 DIAGNOSIS — G603 Idiopathic progressive neuropathy: Secondary | ICD-10-CM | POA: Diagnosis not present

## 2017-09-25 DIAGNOSIS — R202 Paresthesia of skin: Secondary | ICD-10-CM | POA: Diagnosis not present

## 2017-09-26 DIAGNOSIS — R2689 Other abnormalities of gait and mobility: Secondary | ICD-10-CM | POA: Diagnosis not present

## 2017-09-26 DIAGNOSIS — M25661 Stiffness of right knee, not elsewhere classified: Secondary | ICD-10-CM | POA: Diagnosis not present

## 2017-09-26 DIAGNOSIS — M6281 Muscle weakness (generalized): Secondary | ICD-10-CM | POA: Diagnosis not present

## 2017-09-26 DIAGNOSIS — Z471 Aftercare following joint replacement surgery: Secondary | ICD-10-CM | POA: Diagnosis not present

## 2017-09-26 DIAGNOSIS — Z96651 Presence of right artificial knee joint: Secondary | ICD-10-CM | POA: Diagnosis not present

## 2017-10-02 DIAGNOSIS — M25661 Stiffness of right knee, not elsewhere classified: Secondary | ICD-10-CM | POA: Diagnosis not present

## 2017-10-02 DIAGNOSIS — R2689 Other abnormalities of gait and mobility: Secondary | ICD-10-CM | POA: Diagnosis not present

## 2017-10-02 DIAGNOSIS — M6281 Muscle weakness (generalized): Secondary | ICD-10-CM | POA: Diagnosis not present

## 2017-10-02 DIAGNOSIS — Z96651 Presence of right artificial knee joint: Secondary | ICD-10-CM | POA: Diagnosis not present

## 2017-10-02 DIAGNOSIS — Z471 Aftercare following joint replacement surgery: Secondary | ICD-10-CM | POA: Diagnosis not present

## 2017-10-07 DIAGNOSIS — M7061 Trochanteric bursitis, right hip: Secondary | ICD-10-CM | POA: Diagnosis not present

## 2017-10-07 DIAGNOSIS — Z471 Aftercare following joint replacement surgery: Secondary | ICD-10-CM | POA: Diagnosis not present

## 2017-10-07 DIAGNOSIS — Z96651 Presence of right artificial knee joint: Secondary | ICD-10-CM | POA: Diagnosis not present

## 2017-10-08 DIAGNOSIS — M7061 Trochanteric bursitis, right hip: Secondary | ICD-10-CM | POA: Insufficient documentation

## 2017-10-08 HISTORY — DX: Trochanteric bursitis, right hip: M70.61

## 2017-10-14 ENCOUNTER — Ambulatory Visit (INDEPENDENT_AMBULATORY_CARE_PROVIDER_SITE_OTHER): Payer: Medicare Other

## 2017-10-14 ENCOUNTER — Ambulatory Visit (INDEPENDENT_AMBULATORY_CARE_PROVIDER_SITE_OTHER): Payer: Medicare Other | Admitting: Podiatry

## 2017-10-14 ENCOUNTER — Encounter: Payer: Self-pay | Admitting: Podiatry

## 2017-10-14 DIAGNOSIS — M7751 Other enthesopathy of right foot: Secondary | ICD-10-CM

## 2017-10-14 DIAGNOSIS — M778 Other enthesopathies, not elsewhere classified: Secondary | ICD-10-CM

## 2017-10-14 DIAGNOSIS — M7752 Other enthesopathy of left foot: Secondary | ICD-10-CM | POA: Diagnosis not present

## 2017-10-14 DIAGNOSIS — M779 Enthesopathy, unspecified: Secondary | ICD-10-CM

## 2017-10-15 NOTE — Progress Notes (Signed)
Subjective:  Patient ID: Jessica [name], female    DOB: Sep 04, 1950,  MRN: [id number] HPI Chief Complaint  Patient presents with  . Toe Pain    2nd toe left   "My toe has been tender for a few months now"  . Foot Pain    Lateral ankle/dorsal foot right   "The ankle has been swollen and sore on top a lot at night"    67 y.o. female presents with the above complaint.   ROS: All other systems are negative:  Past Medical History:  Diagnosis Date  . Anxiety attack   . Arthritis   . Back pain, chronic   . Crohn's colitis ([place])   . Depression (emotion)   . Dysrhythmia   . GERD (gastroesophageal reflux disease)   . Hypertension   . Hypothyroid   . Neuropathy   . Sleep apnea    cpap is not working per patient - needs to find a new doctor - not used since 7/16 per pat   . Vertigo    followed by Dr Wardell Heath- in [place]    Past Surgical History:  Procedure Laterality Date  . ABDOMINAL HYSTERECTOMY    . CHOLECYSTECTOMY    . COLON SURGERY     for Crohn's in the 53s in [place]  . COLONOSCOPY N/A 03/11/2013   Procedure: COLONOSCOPY;  Surgeon: Rogene Houston, MD;  [facility];  Service: Endoscopy;  Laterality: N/A;  1200  . fatty tumor removed    . HARDWARE REMOVAL Right 08/07/2016   Procedure: HARDWARE REMOVAL;  Surgeon: Gaynelle Arabian, MD;  Location: [facility];  Service: Orthopedics;  Laterality: Right;  . Hip replacement      Rt hip in 2010 in [place]  . KNEE ARTHROSCOPY Left 03/29/2015   Procedure: ARTHROSCOPY LEFT KNEE WITH MENSICAL DEBRIDEMENT, chondroplasty;  Surgeon: Gaynelle Arabian, MD;  Location: [facility];  Service: Orthopedics;  Laterality: Left;  . ORIF TIBIA PLATEAU Right 02/29/2016   Procedure: OPEN REDUCTION INTERNAL FIXATION (ORIF RIGHT  TIBIAL PLATEAU FRACTURE;  Surgeon: Gaynelle Arabian, MD;  [facility]: [facility];  Service: Orthopedics;  Laterality: Right;  . TOTAL KNEE ARTHROPLASTY Right 08/25/2017   Procedure: TOTAL RIGHT KNEE ARTHROPLASTY;  Surgeon: Gaynelle Arabian, MD;   [facility];  Service: Orthopedics;  Laterality: Right;    Current Outpatient Medications:  .  HYDROcodone-acetaminophen (NORCO) 10-325 MG tablet, Norco 10 mg-325 mg tablet  Take 1 tablet every 4 hours by oral route., Disp: , Rfl:  .  bisacodyl (DULCOLAX) 10 MG suppository, Place 1 suppository (10 mg total) rectally daily as needed for moderate constipation. (Patient not taking: Reported on 08/13/2017), Disp: 12 suppository, Rfl: 0 .  celecoxib (CELEBREX) 200 MG capsule, , Disp: , Rfl: 6 .  diazepam (VALIUM) 5 MG tablet, Take 1 tablet (5 mg total) by mouth every 6 (six) hours as needed for muscle spasms., Disp: 80 tablet, Rfl: 0 .  diphenoxylate-atropine (LOMOTIL) 2.5-0.025 MG per tablet, Take 1 tablet by mouth 3 (three) times daily as needed for diarrhea or loose stools. (Patient taking differently: Take 2 tablets by mouth daily. ), Disp: 60 tablet, Rfl: 1 .  docusate sodium (COLACE) 100 MG capsule, Take 1 capsule (100 mg total) by mouth 2 (two) times daily. (Patient taking differently: Take 100 mg by mouth daily as needed. ), Disp: 10 capsule, Rfl: 0 .  fluticasone (FLONASE) 50 MCG/ACT nasal spray, Place 1 spray into the nose 2 (two) times daily. , Disp: , Rfl:  .  gabapentin (NEURONTIN) 300 MG capsule, , Disp: , Rfl: 6 .  gabapentin (NEURONTIN) 800 MG tablet, Take 800 mg by mouth 3 (three) times daily. , Disp: , Rfl:  .  levothyroxine (SYNTHROID, LEVOTHROID) 88 MCG tablet, Take 88 mcg by mouth daily before breakfast. , Disp: , Rfl:  .  lisinopril (PRINIVIL,ZESTRIL) 20 MG tablet, Take 20 mg by mouth daily., Disp: , Rfl:  .  loratadine (CLARITIN) 10 MG tablet, Take 10 mg by mouth daily., Disp: , Rfl:  .  pantoprazole (PROTONIX) 40 MG tablet, Take 1 tablet (40 mg total) by mouth daily. (Patient not taking: Reported on 08/13/2017), Disp: 30 tablet, Rfl: 5 .  polyethylene glycol (MIRALAX / GLYCOLAX) packet, Take 17 g by mouth daily as needed for mild constipation., Disp: 14 each, Rfl: 0 .   RaNITidine HCl (EQ RANITIDINE PO), Take 1 tablet by mouth daily as needed (heartburn)., Disp: , Rfl:  .  rivaroxaban (XARELTO) 10 MG TABS tablet, Take 1 tablet (10 mg total) by mouth daily with breakfast., Disp: 19 tablet, Rfl: 0 .  sertraline (ZOLOFT) 100 MG tablet, Take 1 tablet (100 mg total) by mouth at bedtime., Disp: 90 tablet, Rfl: 3 .  sodium phosphate (FLEET) 7-19 GM/118ML ENEM, Place 133 mLs (1 enema total) rectally once as needed for severe constipation. (Patient not taking: Reported on 08/13/2017), Disp: 2 Bottle, Rfl: 0 .  XIIDRA 5 % SOLN, Place 1 drop into the right eye daily as needed (dry eye). , Disp: , Rfl:   Allergies  Allergen Reactions  . Morphine And Related Hives and Itching    May cause blood pressure to drop  . Robaxin [Methocarbamol] Itching    (PT WILL RESUME TAKING TO SEE IF THIS IS HER ALLERGY)  . Sulfa Antibiotics Itching    ALL SULFA DRUGS   Review of Systems Objective:  There were no vitals filed for this visit.  General: Well developed, nourished, in no acute distress, alert and oriented x3   Dermatological: Skin is warm, dry and supple bilateral. Nails x 10 are well maintained; remaining integument appears unremarkable at this time. There are no open sores, no preulcerative lesions, no rash or signs of infection present.  Vascular: Dorsalis Pedis artery and Posterior Tibial artery pedal pulses are 2/4 bilateral with immedate capillary fill time. Pedal hair growth present. No varicosities and no lower extremity edema present bilateral.   Neruologic: Grossly intact via light touch bilateral. Vibratory intact via tuning fork bilateral. Protective threshold with Semmes Wienstein monofilament intact to all pedal sites bilateral. Patellar and Achilles deep tendon reflexes 2+ bilateral. No Babinski or clonus noted bilateral.   Musculoskeletal: No gross boney pedal deformities bilateral. No pain, crepitus, or limitation noted with foot and ankle range of motion  bilateral. Muscular strength 5/5 in all groups tested bilateral.  Gait: Unassisted, Nonantalgic.    Radiographs:  Radiographs taken today demonstrate no acute findings mild hammertoe deformity second left.  Spurring posterior aspect of the foot minimally.  No major osteoarthritic findings.  Assessment & Plan:   Assessment: Capsulitis of the subtalar joint right.  Capsulitis of the second metatarsal phalangeal joint left with mild hammertoe deformity.  Plan: Discussed etiology pathology conservative versus surgical therapies after sterile Betadine skin prep I injected 2 mg of dexamethasone into the second metatarsal phalangeal joint left with local anesthetic.  She tolerated procedure well.  Band-Aid was placed.  Next we injected after sterile Betadine skin prep subtalar joint with 20 mg of Kenalog 5 mg Marcaine.  She tolerated procedure well without complications Band-Aid was placed.  Discussed appropriate shoe gear stretching exercises ice therapy sugar modifications follow-up with her in 1 month if necessary.     Max T. [place], [place]

## 2017-10-23 DIAGNOSIS — G2581 Restless legs syndrome: Secondary | ICD-10-CM | POA: Diagnosis not present

## 2017-10-23 DIAGNOSIS — Z79899 Other long term (current) drug therapy: Secondary | ICD-10-CM | POA: Diagnosis not present

## 2017-10-23 DIAGNOSIS — I6529 Occlusion and stenosis of unspecified carotid artery: Secondary | ICD-10-CM | POA: Diagnosis not present

## 2017-10-23 DIAGNOSIS — M5441 Lumbago with sciatica, right side: Secondary | ICD-10-CM | POA: Diagnosis not present

## 2017-10-23 DIAGNOSIS — M5442 Lumbago with sciatica, left side: Secondary | ICD-10-CM | POA: Diagnosis not present

## 2017-10-23 DIAGNOSIS — G5603 Carpal tunnel syndrome, bilateral upper limbs: Secondary | ICD-10-CM | POA: Diagnosis not present

## 2017-10-23 DIAGNOSIS — Z96651 Presence of right artificial knee joint: Secondary | ICD-10-CM | POA: Diagnosis not present

## 2017-10-23 DIAGNOSIS — M542 Cervicalgia: Secondary | ICD-10-CM | POA: Diagnosis not present

## 2017-11-04 DIAGNOSIS — Z0000 Encounter for general adult medical examination without abnormal findings: Secondary | ICD-10-CM | POA: Diagnosis not present

## 2017-11-04 DIAGNOSIS — Z6830 Body mass index (BMI) 30.0-30.9, adult: Secondary | ICD-10-CM | POA: Diagnosis not present

## 2017-11-04 DIAGNOSIS — Z1211 Encounter for screening for malignant neoplasm of colon: Secondary | ICD-10-CM | POA: Diagnosis not present

## 2017-11-04 DIAGNOSIS — E785 Hyperlipidemia, unspecified: Secondary | ICD-10-CM | POA: Diagnosis not present

## 2017-11-04 DIAGNOSIS — Z7189 Other specified counseling: Secondary | ICD-10-CM | POA: Diagnosis not present

## 2017-11-04 DIAGNOSIS — Z1339 Encounter for screening examination for other mental health and behavioral disorders: Secondary | ICD-10-CM | POA: Diagnosis not present

## 2017-11-04 DIAGNOSIS — Z79899 Other long term (current) drug therapy: Secondary | ICD-10-CM | POA: Diagnosis not present

## 2017-11-04 DIAGNOSIS — K5010 Crohn's disease of large intestine without complications: Secondary | ICD-10-CM | POA: Diagnosis not present

## 2017-11-04 DIAGNOSIS — E559 Vitamin D deficiency, unspecified: Secondary | ICD-10-CM | POA: Diagnosis not present

## 2017-11-04 DIAGNOSIS — Z1331 Encounter for screening for depression: Secondary | ICD-10-CM | POA: Diagnosis not present

## 2017-11-04 DIAGNOSIS — E039 Hypothyroidism, unspecified: Secondary | ICD-10-CM | POA: Diagnosis not present

## 2017-11-04 DIAGNOSIS — Z299 Encounter for prophylactic measures, unspecified: Secondary | ICD-10-CM | POA: Diagnosis not present

## 2017-11-04 DIAGNOSIS — N183 Chronic kidney disease, stage 3 (moderate): Secondary | ICD-10-CM | POA: Diagnosis not present

## 2017-11-11 DIAGNOSIS — Z471 Aftercare following joint replacement surgery: Secondary | ICD-10-CM | POA: Insufficient documentation

## 2017-11-13 ENCOUNTER — Ambulatory Visit: Payer: No Typology Code available for payment source | Admitting: Podiatry

## 2017-11-18 ENCOUNTER — Ambulatory Visit: Payer: Medicare Other | Admitting: Cardiovascular Disease

## 2017-12-29 ENCOUNTER — Other Ambulatory Visit (INDEPENDENT_AMBULATORY_CARE_PROVIDER_SITE_OTHER): Payer: Self-pay | Admitting: Internal Medicine

## 2017-12-29 DIAGNOSIS — R197 Diarrhea, unspecified: Secondary | ICD-10-CM

## 2018-01-22 DIAGNOSIS — R200 Anesthesia of skin: Secondary | ICD-10-CM | POA: Diagnosis not present

## 2018-01-22 DIAGNOSIS — R202 Paresthesia of skin: Secondary | ICD-10-CM | POA: Diagnosis not present

## 2018-01-22 DIAGNOSIS — M25811 Other specified joint disorders, right shoulder: Secondary | ICD-10-CM | POA: Diagnosis not present

## 2018-01-22 DIAGNOSIS — R5383 Other fatigue: Secondary | ICD-10-CM | POA: Diagnosis not present

## 2018-01-22 DIAGNOSIS — K5010 Crohn's disease of large intestine without complications: Secondary | ICD-10-CM | POA: Diagnosis not present

## 2018-01-22 DIAGNOSIS — I10 Essential (primary) hypertension: Secondary | ICD-10-CM | POA: Diagnosis not present

## 2018-01-22 DIAGNOSIS — M542 Cervicalgia: Secondary | ICD-10-CM | POA: Diagnosis not present

## 2018-01-22 DIAGNOSIS — M25841 Other specified joint disorders, right hand: Secondary | ICD-10-CM | POA: Diagnosis not present

## 2018-01-22 DIAGNOSIS — D649 Anemia, unspecified: Secondary | ICD-10-CM | POA: Diagnosis not present

## 2018-01-22 DIAGNOSIS — Z299 Encounter for prophylactic measures, unspecified: Secondary | ICD-10-CM | POA: Diagnosis not present

## 2018-01-22 DIAGNOSIS — Z6830 Body mass index (BMI) 30.0-30.9, adult: Secondary | ICD-10-CM | POA: Diagnosis not present

## 2018-01-23 DIAGNOSIS — M1711 Unilateral primary osteoarthritis, right knee: Secondary | ICD-10-CM | POA: Diagnosis not present

## 2018-01-23 DIAGNOSIS — Z471 Aftercare following joint replacement surgery: Secondary | ICD-10-CM | POA: Diagnosis not present

## 2018-01-23 DIAGNOSIS — Z96651 Presence of right artificial knee joint: Secondary | ICD-10-CM | POA: Diagnosis not present

## 2018-02-03 ENCOUNTER — Ambulatory Visit (INDEPENDENT_AMBULATORY_CARE_PROVIDER_SITE_OTHER): Payer: Medicare Other | Admitting: Psychiatry

## 2018-02-03 ENCOUNTER — Encounter (HOSPITAL_COMMUNITY): Payer: Self-pay | Admitting: Psychiatry

## 2018-02-03 DIAGNOSIS — F331 Major depressive disorder, recurrent, moderate: Secondary | ICD-10-CM | POA: Diagnosis not present

## 2018-02-03 DIAGNOSIS — Z818 Family history of other mental and behavioral disorders: Secondary | ICD-10-CM

## 2018-02-03 MED ORDER — SERTRALINE HCL 100 MG PO TABS: 100.0000 mg | ORAL_TABLET | Freq: Every day | ORAL | 3 refills | Status: DC

## 2018-02-03 NOTE — Progress Notes (Signed)
[facility] MD/PA/NP OP Progress Note  02/03/2018 1:17 PM Jessica [name]  MRN:  [id number]  Chief Complaint:  Chief Complaint    Depression; Anxiety; Follow-up     HPI: This patient is a 67 year old married white female who lives with her husband in [place].  She has 2 grown sons.  She retired as a Magazine features editor about 3 years ago.  The patient returns for follow-up regarding depression.  She was last seen 6 months ago.  Since then she had right knee replacement in February.  She is still having some chronic pain from this as well as sciatica ankle pain in general myalgias.  This sometimes keeps her from sleeping.  Overall her mood is good however and she denies any current symptoms of depression or suicidal ideation.  She asked if she can go back down on the Zoloft but since she is doing so on the current dosage I do not think this is the best idea and she agrees Visit Diagnosis:    ICD-10-CM   1. Major depressive disorder, recurrent episode, moderate (HCC) F33.1 sertraline (ZOLOFT) 100 MG tablet    Past Psychiatric History: none  Past Medical History:  Past Medical History:  Diagnosis Date  . Anxiety attack   . Arthritis   . Back pain, chronic   . Crohn's colitis ([place])   . Depression (emotion)   . Dysrhythmia   . GERD (gastroesophageal reflux disease)   . Hypertension   . Hypothyroid   . Neuropathy   . Sleep apnea    cpap is not working per patient - needs to find a new doctor - not used since 7/16 per pat   . Vertigo    followed by Dr Wardell Heath- in [place]     Past Surgical History:  Procedure Laterality Date  . ABDOMINAL HYSTERECTOMY    . CHOLECYSTECTOMY    . COLON SURGERY     for Crohn's in the 32s in [place]  . COLONOSCOPY N/A 03/11/2013   Procedure: COLONOSCOPY;  Surgeon: Rogene Houston, MD;  [facility];  Service: Endoscopy;  Laterality: N/A;  1200  . fatty tumor removed    . HARDWARE REMOVAL Right 08/07/2016   Procedure: HARDWARE REMOVAL;  Surgeon: Gaynelle Arabian, MD;  [facility];  Service: Orthopedics;  Laterality: Right;  . Hip replacement      Rt hip in 2010 in [place]  . KNEE ARTHROSCOPY Left 03/29/2015   Procedure: ARTHROSCOPY LEFT KNEE WITH MENSICAL DEBRIDEMENT, chondroplasty;  Surgeon: Gaynelle Arabian, MD;  [facility];  Service: Orthopedics;  Laterality: Left;  . ORIF TIBIA PLATEAU Right 02/29/2016   Procedure: OPEN REDUCTION INTERNAL FIXATION (ORIF RIGHT  TIBIAL PLATEAU FRACTURE;  Surgeon: Gaynelle Arabian, MD;  [facility];  Service: Orthopedics;  Laterality: Right;  . TOTAL KNEE ARTHROPLASTY Right 08/25/2017   Procedure: TOTAL RIGHT KNEE ARTHROPLASTY;  Surgeon: Gaynelle Arabian, MD;  [facility];  Service: Orthopedics;  Laterality: Right;    Family Psychiatric History: See below  Family History:  Family History  Problem Relation Age of Onset  . Crohn's disease Brother   . Depression Mother   . Anxiety disorder Mother   . Depression Sister   . Aneurysm Father   . Colon cancer Neg Hx     Social History:  Social History   Socioeconomic History  . Marital status: Married    Spouse name: Not on file  . Number of children: Not on file  . Years of education: Not  on file  . Highest education level: Not on file  Occupational History  . Not on file  Social Needs  . Financial resource strain: Not on file  . Food insecurity:    Worry: Not on file    Inability: Not on file  . Transportation needs:    Medical: Not on file    Non-medical: Not on file  Tobacco Use  . Smoking status: Never Smoker  . Smokeless tobacco: Never Used  Substance and Sexual Activity  . Alcohol use: No    Alcohol/week: 0.0 oz  . Drug use: No  . Sexual activity: Not Currently  Lifestyle  . Physical activity:    Days per week: Not on file    Minutes per session: Not on file  . Stress: Not on file  Relationships  . Social connections:    Talks on phone: Not on file    Gets together: Not on file    Attends religious service: Not  on file    Active member of club or organization: Not on file    Attends meetings of clubs or organizations: Not on file    Relationship status: Not on file  Other Topics Concern  . Not on file  Social History Narrative  . Not on file    Allergies:  Allergies  Allergen Reactions  . Morphine And Related Hives and Itching    May cause blood pressure to drop  . Robaxin [Methocarbamol] Itching    (PT WILL RESUME TAKING TO SEE IF THIS IS HER ALLERGY)  . Sulfa Antibiotics Itching    ALL SULFA DRUGS    Metabolic Disorder Labs: No results found for: HGBA1C, MPG No results found for: PROLACTIN No results found for: CHOL, TRIG, HDL, CHOLHDL, VLDL, LDLCALC No results found for: TSH  Therapeutic Level Labs: No results found for: LITHIUM No results found for: VALPROATE No components found for:  CBMZ  Current Medications: Current Outpatient Medications  Medication Sig Dispense Refill  . diazepam (VALIUM) 5 MG tablet Take 1 tablet (5 mg total) by mouth every 6 (six) hours as needed for muscle spasms. 80 tablet 0  . diphenoxylate-atropine (LOMOTIL) 2.5-0.025 MG tablet TAKE 1 TABLET BY MOUTH THREE TIMES DAILY AS NEEDED FOR DIARRHEA 90 tablet 2  . docusate sodium (COLACE) 100 MG capsule Take 1 capsule (100 mg total) by mouth 2 (two) times daily. (Patient taking differently: Take 100 mg by mouth daily as needed. ) 10 capsule 0  . fluticasone (FLONASE) 50 MCG/ACT nasal spray Place 1 spray into the nose 2 (two) times daily.     Marland Kitchen gabapentin (NEURONTIN) 300 MG capsule   6  . gabapentin (NEURONTIN) 800 MG tablet Take 800 mg by mouth 3 (three) times daily.     Marland Kitchen HYDROcodone-acetaminophen (NORCO) 10-325 MG tablet Norco 10 mg-325 mg tablet  Take 1 tablet every 4 hours by oral route.    Marland Kitchen levothyroxine (SYNTHROID, LEVOTHROID) 88 MCG tablet Take 88 mcg by mouth daily before breakfast.     . lisinopril (PRINIVIL,ZESTRIL) 20 MG tablet Take 20 mg by mouth daily.    Marland Kitchen loratadine (CLARITIN) 10 MG tablet  Take 10 mg by mouth daily.    . pantoprazole (PROTONIX) 40 MG tablet Take 1 tablet (40 mg total) by mouth daily. 30 tablet 5  . sertraline (ZOLOFT) 100 MG tablet Take 1 tablet (100 mg total) by mouth at bedtime. 90 tablet 3  . XIIDRA 5 % SOLN Place 1 drop into the right eye daily  as needed (dry eye).      No current facility-administered medications for this visit.      Musculoskeletal: Strength & Muscle Tone: decreased Gait & Station: normal Patient leans: N/A  Psychiatric Specialty Exam: Review of Systems  Musculoskeletal: Positive for back pain, joint pain and myalgias.  All other systems reviewed and are negative.   Blood pressure (!) 164/77, pulse 75, height 5' 2"  (1.575 m), weight 161 lb (73 kg), SpO2 97 %.Body mass index is 29.45 kg/m.  General Appearance: Casual, Neat and Well Groomed  Eye Contact:  Good  Speech:  Clear and Coherent  Volume:  Normal  Mood:  Euthymic  Affect:  Congruent  Thought Process:  Goal Directed  Orientation:  Full (Time, Place, and Person)  Thought Content: WDL   Suicidal Thoughts:  No  Homicidal Thoughts:  No  Memory:  Immediate;   Good Recent;   Good Remote;   Good  Judgement:  Good  Insight:  Fair  Psychomotor Activity:  Decreased  Concentration:  Concentration: Good and Attention Span: Good  Recall:  Good  Fund of Knowledge: Good  Language: Good  Akathisia:  No  Handed:  Right  AIMS (if indicated): not done  Assets:  Communication Skills Desire for Improvement Resilience Social Support Talents/Skills  ADL's:  Intact  Cognition: WNL  Sleep:  Fair   Screenings:   Assessment and Plan: Patient is a 67 year old female with a history of depression.  She is doing quite well on Zoloft 100 mg daily and will continue at this dosage and return to see me in 6 months at her request.   [name], MD 02/03/2018, 1:17 PM

## 2018-02-03 NOTE — Progress Notes (Signed)
[place] MD/PA/NP OP Progress Note  02/03/2018 1:17 PM Jessica [name]  MRN:  [id number]  Chief Complaint:  Chief Complaint    Depression; Anxiety; Follow-up     HPI: This patient is a 67 year old married white female who lives with her husband in [place].  She has 2 grown sons.  She retired as a Magazine features editor about 3 years ago.  The patient had right knee replacement last February.  It is still hurting but is slowly getting better.  She has a lot of body aches and pains and sciatica.  This sometimes makes it hard for her to sleep.  Her mood however has been good and her energy is fairly good as well.  Denies any current symptoms of depression or anxiety.  She asked if she can go back down to Zoloft 75 mg but since it is only 25 mg change and she would have to break up the 50 mg pills I think it would just be easier to stay where she is at 100 mg and she agrees.  She is not having any current side effects. Visit Diagnosis:    ICD-10-CM   1. Major depressive disorder, recurrent episode, moderate (HCC) F33.1 sertraline (ZOLOFT) 100 MG tablet    Past Psychiatric History: None  Past Medical History:  Past Medical History:  Diagnosis Date  . Anxiety attack   . Arthritis   . Back pain, chronic   . Crohn's colitis ([place])   . Depression (emotion)   . Dysrhythmia   . GERD (gastroesophageal reflux disease)   . Hypertension   . Hypothyroid   . Neuropathy   . Sleep apnea    cpap is not working per patient - needs to find a new doctor - not used since 7/16 per pat   . Vertigo    followed by Dr Wardell Heath- in [place]     Past Surgical History:  Procedure Laterality Date  . ABDOMINAL HYSTERECTOMY    . CHOLECYSTECTOMY    . COLON SURGERY     for Crohn's in the 41s in [place]  . COLONOSCOPY N/A 03/11/2013   Procedure: COLONOSCOPY;  Surgeon: Rogene Houston, MD;  [facility];  Service: Endoscopy;  Laterality: N/A;  1200  . fatty tumor removed    . HARDWARE REMOVAL Right 08/07/2016    Procedure: HARDWARE REMOVAL;  Surgeon: Gaynelle Arabian, MD;  Location: [facility];  Service: Orthopedics;  Laterality: Right;  . Hip replacement      Rt hip in 2010 in [place]  . KNEE ARTHROSCOPY Left 03/29/2015   Procedure: ARTHROSCOPY LEFT KNEE WITH MENSICAL DEBRIDEMENT, chondroplasty;  Surgeon: Gaynelle Arabian, MD;  Location: [facility];  Service: Orthopedics;  Laterality: Left;  . ORIF TIBIA PLATEAU Right 02/29/2016   Procedure: OPEN REDUCTION INTERNAL FIXATION (ORIF RIGHT  TIBIAL PLATEAU FRACTURE;  Surgeon: Gaynelle Arabian, MD;  Location: [facility];  Service: Orthopedics;  Laterality: Right;  . TOTAL KNEE ARTHROPLASTY Right 08/25/2017   Procedure: TOTAL RIGHT KNEE ARTHROPLASTY;  Surgeon: Gaynelle Arabian, MD;  Location: [facility];  Service: Orthopedics;  Laterality: Right;    Family Psychiatric History: See below  Family History:  Family History  Problem Relation Age of Onset  . Crohn's disease Brother   . Depression Mother   . Anxiety disorder Mother   . Depression Sister   . Aneurysm Father   . Colon cancer Neg Hx     Social History:  Social History   Socioeconomic History  . Marital status: Married  Spouse name: Not on file  . Number of children: Not on file  . Years of education: Not on file  . Highest education level: Not on file  Occupational History  . Not on file  Social Needs  . Financial resource strain: Not on file  . Food insecurity:    Worry: Not on file    Inability: Not on file  . Transportation needs:    Medical: Not on file    Non-medical: Not on file  Tobacco Use  . Smoking status: Never Smoker  . Smokeless tobacco: Never Used  Substance and Sexual Activity  . Alcohol use: No    Alcohol/week: 0.0 oz  . Drug use: No  . Sexual activity: Not Currently  Lifestyle  . Physical activity:    Days per week: Not on file    Minutes per session: Not on file  . Stress: Not on file  Relationships  . Social connections:    Talks on phone: Not on file    Gets together:  Not on file    Attends religious service: Not on file    Active member of club or organization: Not on file    Attends meetings of clubs or organizations: Not on file    Relationship status: Not on file  Other Topics Concern  . Not on file  Social History Narrative  . Not on file    Allergies:  Allergies  Allergen Reactions  . Morphine And Related Hives and Itching    May cause blood pressure to drop  . Robaxin [Methocarbamol] Itching    (PT WILL RESUME TAKING TO SEE IF THIS IS HER ALLERGY)  . Sulfa Antibiotics Itching    ALL SULFA DRUGS    Metabolic Disorder Labs: No results found for: HGBA1C, MPG No results found for: PROLACTIN No results found for: CHOL, TRIG, HDL, CHOLHDL, VLDL, LDLCALC No results found for: TSH  Therapeutic Level Labs: No results found for: LITHIUM No results found for: VALPROATE No components found for:  CBMZ  Current Medications: Current Outpatient Medications  Medication Sig Dispense Refill  . diazepam (VALIUM) 5 MG tablet Take 1 tablet (5 mg total) by mouth every 6 (six) hours as needed for muscle spasms. 80 tablet 0  . diphenoxylate-atropine (LOMOTIL) 2.5-0.025 MG tablet TAKE 1 TABLET BY MOUTH THREE TIMES DAILY AS NEEDED FOR DIARRHEA 90 tablet 2  . docusate sodium (COLACE) 100 MG capsule Take 1 capsule (100 mg total) by mouth 2 (two) times daily. (Patient taking differently: Take 100 mg by mouth daily as needed. ) 10 capsule 0  . fluticasone (FLONASE) 50 MCG/ACT nasal spray Place 1 spray into the nose 2 (two) times daily.     Marland Kitchen gabapentin (NEURONTIN) 300 MG capsule   6  . gabapentin (NEURONTIN) 800 MG tablet Take 800 mg by mouth 3 (three) times daily.     Marland Kitchen HYDROcodone-acetaminophen (NORCO) 10-325 MG tablet Norco 10 mg-325 mg tablet  Take 1 tablet every 4 hours by oral route.    Marland Kitchen levothyroxine (SYNTHROID, LEVOTHROID) 88 MCG tablet Take 88 mcg by mouth daily before breakfast.     . lisinopril (PRINIVIL,ZESTRIL) 20 MG tablet Take 20 mg by mouth  daily.    Marland Kitchen loratadine (CLARITIN) 10 MG tablet Take 10 mg by mouth daily.    . pantoprazole (PROTONIX) 40 MG tablet Take 1 tablet (40 mg total) by mouth daily. 30 tablet 5  . sertraline (ZOLOFT) 100 MG tablet Take 1 tablet (100 mg total) by mouth  at bedtime. 90 tablet 3  . XIIDRA 5 % SOLN Place 1 drop into the right eye daily as needed (dry eye).      No current facility-administered medications for this visit.      Musculoskeletal: Strength & Muscle Tone: within normal limits Gait & Station: unsteady Patient leans: N/A  Psychiatric Specialty Exam: Review of Systems  Gastrointestinal: Positive for diarrhea.  Musculoskeletal: Positive for joint pain and myalgias.  All other systems reviewed and are negative.   Blood pressure (!) 164/77, pulse 75, height 5' 2"  (1.575 m), weight 161 lb (73 kg), SpO2 97 %.Body mass index is 29.45 kg/m.  General Appearance: Casual, Neat and Well Groomed  Eye Contact:  Good  Speech:  Clear and Coherent  Volume:  Normal  Mood:  Euthymic  Affect:  Congruent  Thought Process:  Goal Directed  Orientation:  Full (Time, Place, and Person)  Thought Content: WDL   Suicidal Thoughts:  No  Homicidal Thoughts:  No  Memory:  Immediate;   Good Recent;   Good Remote;   Good  Judgement:  Good  Insight:  Good  Psychomotor Activity:  Normal  Concentration:  Concentration: Good and Attention Span: Good  Recall:  Good  Fund of Knowledge: Good  Language: Good  Akathisia:  No  Handed:  Right  AIMS (if indicated): not done  Assets:  Communication Skills Desire for Improvement Resilience Social Support Talents/Skills  ADL's:  Intact  Cognition: WNL  Sleep:  Fair   Screenings:   Assessment and Plan: Patient is a 67 year old female with a history of depression.  She is doing very well on Zoloft 100 mg daily so I think we may as well leave it where it is and she agrees.  She will return to see me in 6 months   [name], MD 02/03/2018, 1:17 PM

## 2018-02-05 DIAGNOSIS — H903 Sensorineural hearing loss, bilateral: Secondary | ICD-10-CM | POA: Diagnosis not present

## 2018-02-10 DIAGNOSIS — I10 Essential (primary) hypertension: Secondary | ICD-10-CM | POA: Diagnosis not present

## 2018-02-10 DIAGNOSIS — Z299 Encounter for prophylactic measures, unspecified: Secondary | ICD-10-CM | POA: Diagnosis not present

## 2018-02-10 DIAGNOSIS — Z713 Dietary counseling and surveillance: Secondary | ICD-10-CM | POA: Diagnosis not present

## 2018-02-10 DIAGNOSIS — Z6830 Body mass index (BMI) 30.0-30.9, adult: Secondary | ICD-10-CM | POA: Diagnosis not present

## 2018-02-10 DIAGNOSIS — R21 Rash and other nonspecific skin eruption: Secondary | ICD-10-CM | POA: Diagnosis not present

## 2018-02-11 DIAGNOSIS — L942 Calcinosis cutis: Secondary | ICD-10-CM | POA: Diagnosis not present

## 2018-02-11 DIAGNOSIS — D485 Neoplasm of uncertain behavior of skin: Secondary | ICD-10-CM | POA: Diagnosis not present

## 2018-02-11 DIAGNOSIS — D225 Melanocytic nevi of trunk: Secondary | ICD-10-CM | POA: Diagnosis not present

## 2018-02-11 DIAGNOSIS — L0100 Impetigo, unspecified: Secondary | ICD-10-CM | POA: Diagnosis not present

## 2018-02-11 DIAGNOSIS — L509 Urticaria, unspecified: Secondary | ICD-10-CM | POA: Diagnosis not present

## 2018-02-12 ENCOUNTER — Encounter: Payer: Self-pay | Admitting: Podiatry

## 2018-02-12 ENCOUNTER — Ambulatory Visit (INDEPENDENT_AMBULATORY_CARE_PROVIDER_SITE_OTHER): Payer: Medicare Other | Admitting: Podiatry

## 2018-02-12 DIAGNOSIS — M7752 Other enthesopathy of left foot: Secondary | ICD-10-CM

## 2018-02-12 DIAGNOSIS — M7751 Other enthesopathy of right foot: Secondary | ICD-10-CM

## 2018-02-12 DIAGNOSIS — M779 Enthesopathy, unspecified: Secondary | ICD-10-CM

## 2018-02-12 DIAGNOSIS — M722 Plantar fascial fibromatosis: Secondary | ICD-10-CM

## 2018-02-12 NOTE — Progress Notes (Signed)
She presents today for follow-up of her subtalar joint capsulitis forefoot pain states that things fasciitis this acting up.  I am supposed to be walking by Dr. Reynaldo Minium said that I had to exercise harder because of my bad knees.  Objective: Vital signs are stable alert and oriented x3.  Pulses are palpable.  She has pain on palpation of the sinus tarsi and on and range of motion of the subtalar joint bilaterally.  She also has pain on palpation of the plantar fascia bilaterally.  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  Plan: Discussed etiology pathology conservative versus surgical therapies.  At this point I injected her sinus tarsi and plantar fascia bilaterally each was injected with 20 mg of Kenalog 5 mg Marcaine.  And I will follow-up with her in 1 month or so.  May need to consider orthotics for her.

## 2018-02-13 DIAGNOSIS — Z96652 Presence of left artificial knee joint: Secondary | ICD-10-CM | POA: Insufficient documentation

## 2018-02-13 DIAGNOSIS — M25562 Pain in left knee: Secondary | ICD-10-CM | POA: Diagnosis not present

## 2018-02-13 DIAGNOSIS — M25561 Pain in right knee: Secondary | ICD-10-CM | POA: Diagnosis not present

## 2018-02-19 ENCOUNTER — Ambulatory Visit: Payer: Medicare Other | Admitting: Orthotics

## 2018-02-19 DIAGNOSIS — M722 Plantar fascial fibromatosis: Secondary | ICD-10-CM

## 2018-02-19 DIAGNOSIS — M7751 Other enthesopathy of right foot: Secondary | ICD-10-CM

## 2018-02-19 DIAGNOSIS — M7752 Other enthesopathy of left foot: Secondary | ICD-10-CM

## 2018-02-19 NOTE — Progress Notes (Signed)
Patient was seen and casted today for CMFO to address capsulitis of ankle left, PF right.  Once told of the cost, she wants to go home an consider it.  We told her, per Jocelyn Lamer, that if she put 200 down, she could make 25 payments for the rest.

## 2018-03-10 DIAGNOSIS — R270 Ataxia, unspecified: Secondary | ICD-10-CM | POA: Diagnosis not present

## 2018-03-10 DIAGNOSIS — G2581 Restless legs syndrome: Secondary | ICD-10-CM | POA: Diagnosis not present

## 2018-03-10 DIAGNOSIS — M545 Low back pain: Secondary | ICD-10-CM | POA: Diagnosis not present

## 2018-03-10 DIAGNOSIS — M542 Cervicalgia: Secondary | ICD-10-CM | POA: Diagnosis not present

## 2018-03-10 DIAGNOSIS — G5603 Carpal tunnel syndrome, bilateral upper limbs: Secondary | ICD-10-CM | POA: Diagnosis not present

## 2018-03-10 DIAGNOSIS — G603 Idiopathic progressive neuropathy: Secondary | ICD-10-CM | POA: Diagnosis not present

## 2018-04-01 DIAGNOSIS — Z23 Encounter for immunization: Secondary | ICD-10-CM | POA: Diagnosis not present

## 2018-04-06 ENCOUNTER — Other Ambulatory Visit (INDEPENDENT_AMBULATORY_CARE_PROVIDER_SITE_OTHER): Payer: Self-pay | Admitting: Internal Medicine

## 2018-04-06 DIAGNOSIS — R197 Diarrhea, unspecified: Secondary | ICD-10-CM

## 2018-05-12 IMAGING — DX DG LUMBAR SPINE COMPLETE 4+V
5 series · 5 of 5 positions shown · non-contrast
Comparison: 05/01/2015.

CLINICAL DATA: Pain in back and left hip.  No known injury.

EXAM:
LUMBAR SPINE - COMPLETE 4+ VIEW

[l-spine ap]
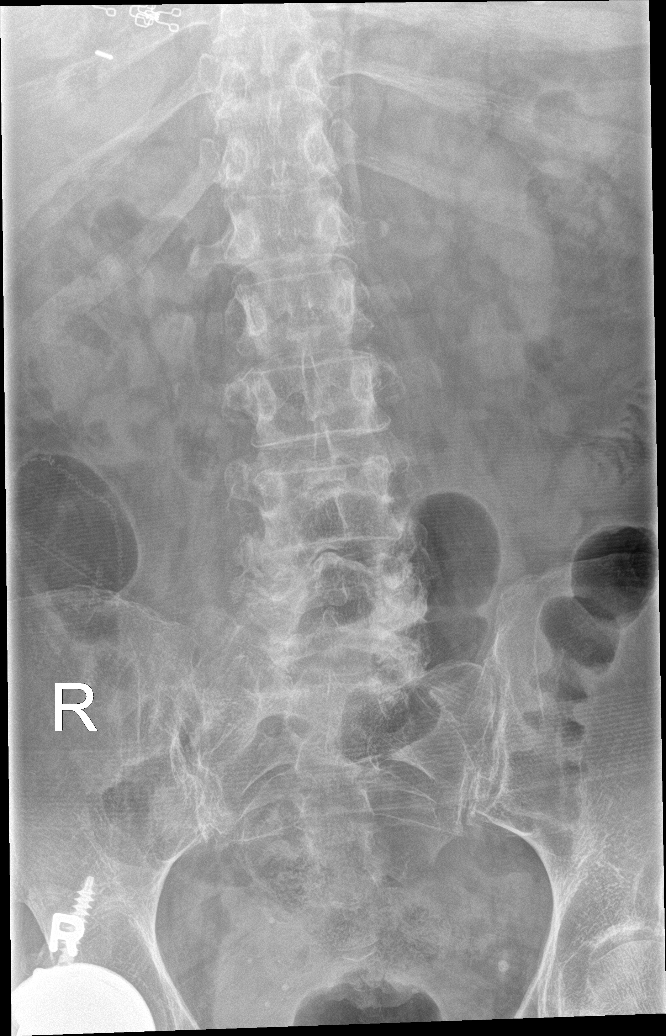

[l-spine obl (1 of 2)]
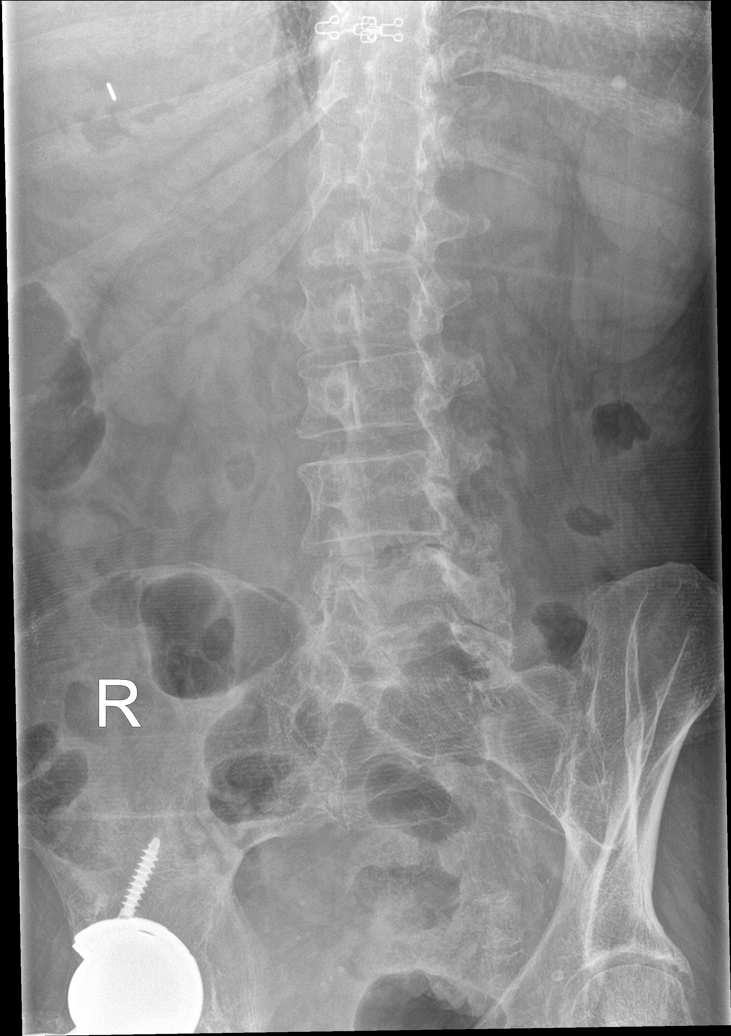

[l-spine obl (2 of 2)]
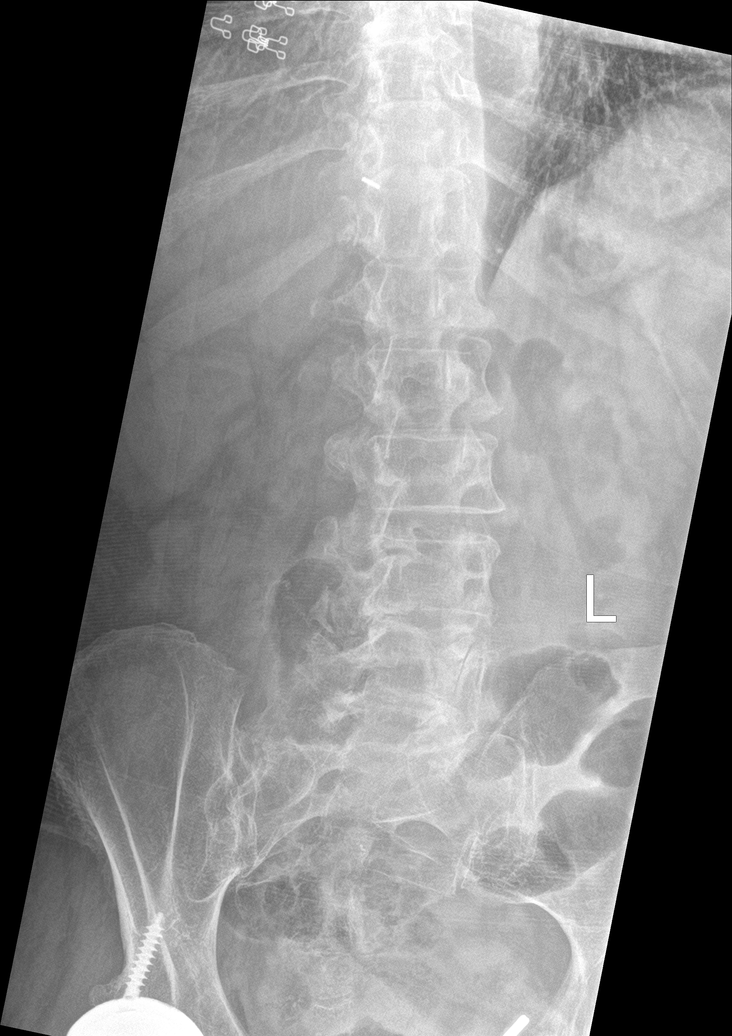

[l-spine lat]
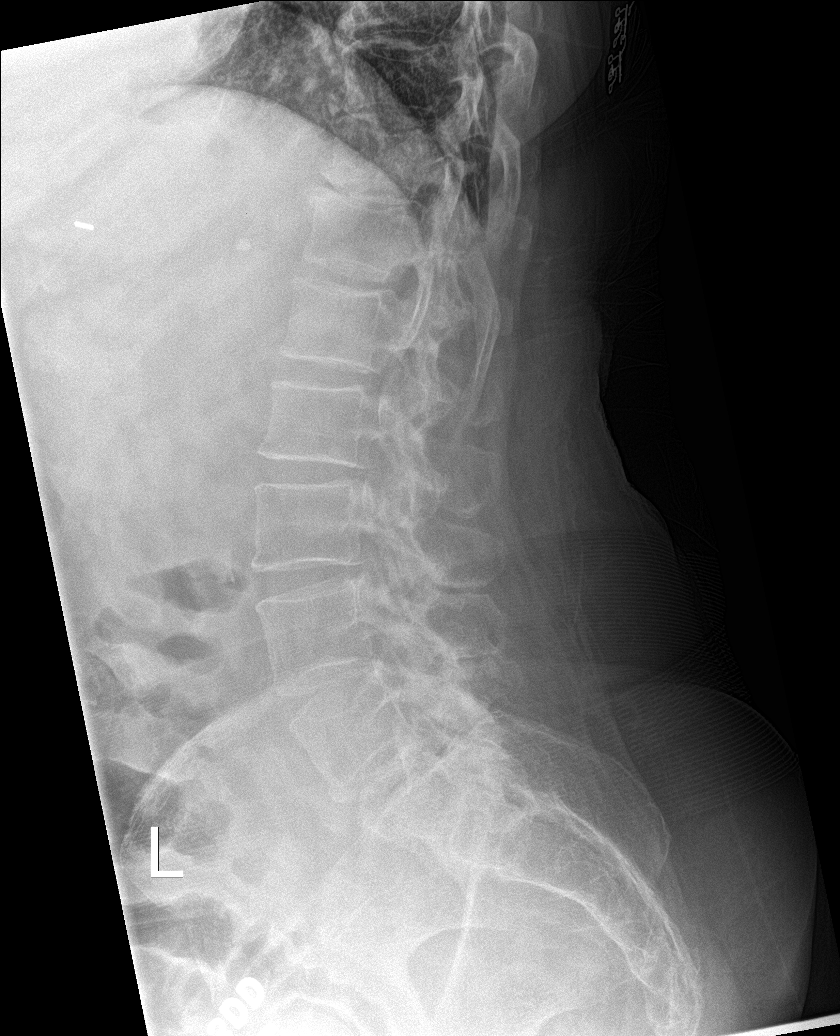

[l-spine spot]
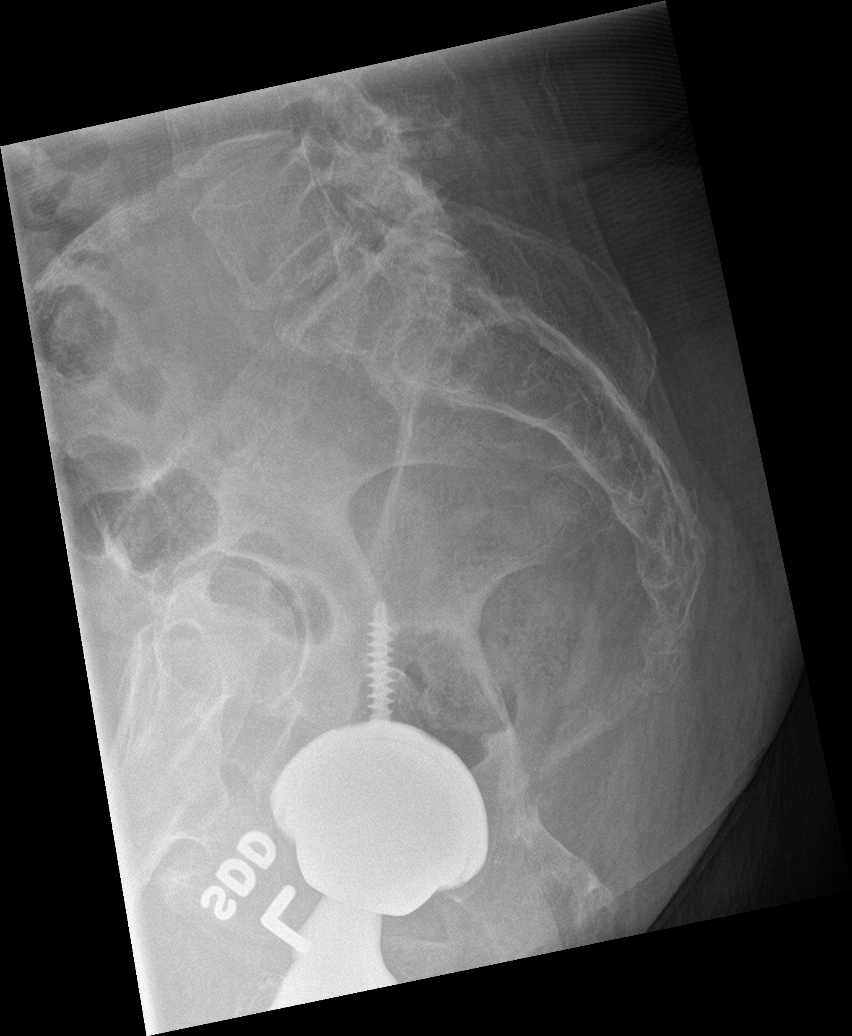

[5 of 5 positions shown; findings below may reference images not displayed]

FINDINGS: The bones appear diffusely osteopenic. The vertebral body heights
are well preserved. No fracture identified. Mild multi level disc
space narrowing and ventral endplate spurring. Most advanced at
L5-S1 and T11-T12.
IMPRESSION: 1. No acute findings identified.
2. Degenerative disc disease.

## 2018-05-13 DIAGNOSIS — M5412 Radiculopathy, cervical region: Secondary | ICD-10-CM | POA: Diagnosis not present

## 2018-05-13 DIAGNOSIS — R270 Ataxia, unspecified: Secondary | ICD-10-CM | POA: Diagnosis not present

## 2018-05-13 DIAGNOSIS — G603 Idiopathic progressive neuropathy: Secondary | ICD-10-CM | POA: Diagnosis not present

## 2018-05-13 DIAGNOSIS — M5441 Lumbago with sciatica, right side: Secondary | ICD-10-CM | POA: Diagnosis not present

## 2018-05-13 DIAGNOSIS — M5442 Lumbago with sciatica, left side: Secondary | ICD-10-CM | POA: Diagnosis not present

## 2018-05-13 DIAGNOSIS — Z79899 Other long term (current) drug therapy: Secondary | ICD-10-CM | POA: Diagnosis not present

## 2018-05-13 DIAGNOSIS — G5603 Carpal tunnel syndrome, bilateral upper limbs: Secondary | ICD-10-CM | POA: Diagnosis not present

## 2018-05-14 ENCOUNTER — Ambulatory Visit (INDEPENDENT_AMBULATORY_CARE_PROVIDER_SITE_OTHER): Payer: Medicare Other | Admitting: Internal Medicine

## 2018-05-14 ENCOUNTER — Encounter (INDEPENDENT_AMBULATORY_CARE_PROVIDER_SITE_OTHER): Payer: Self-pay | Admitting: Internal Medicine

## 2018-05-14 ENCOUNTER — Other Ambulatory Visit (INDEPENDENT_AMBULATORY_CARE_PROVIDER_SITE_OTHER): Payer: Self-pay | Admitting: Internal Medicine

## 2018-05-14 ENCOUNTER — Encounter (INDEPENDENT_AMBULATORY_CARE_PROVIDER_SITE_OTHER): Payer: Self-pay | Admitting: *Deleted

## 2018-05-14 VITALS — BP 150/80 | HR 60 | Temp 98.1°F | Ht 62.0 in | Wt 161.1 lb

## 2018-05-14 DIAGNOSIS — Z1211 Encounter for screening for malignant neoplasm of colon: Secondary | ICD-10-CM | POA: Diagnosis not present

## 2018-05-14 DIAGNOSIS — R197 Diarrhea, unspecified: Secondary | ICD-10-CM

## 2018-05-14 DIAGNOSIS — K50913 Crohn's disease, unspecified, with fistula: Secondary | ICD-10-CM | POA: Insufficient documentation

## 2018-05-14 DIAGNOSIS — K5090 Crohn's disease, unspecified, without complications: Secondary | ICD-10-CM

## 2018-05-14 HISTORY — DX: Crohn's disease, unspecified, without complications: K50.90

## 2018-05-14 NOTE — Patient Instructions (Signed)
The risks of bleeding, perforation and infection were reviewed with patient.  

## 2018-05-14 NOTE — Telephone Encounter (Signed)
Patient needs suprep 

## 2018-05-14 NOTE — Progress Notes (Signed)
Subjective:    Patient ID: Jessica [name], female    DOB: [birth year], 67 y.o.   MRN: [id number]  HPI Here today for f/u. Last seen in March of 2018. Hx of Crohn's disease. At last OV Rx for Pentasa was sent to her pharmacy.  Did not have the Pentasa filled due to the expense. She thinks she sees blood in her stool. She says her stool card at [name] office was negative. She is having a BM 3-4 times a day. She takes Lomotil daily. Her appetite is okay. She has gained from 151 to 161.1.  9/4/2014Colonoscopy  Indications:Patient is 67 year-old Caucasian female with history of Crohn disease who presents with diarrhea. Her last colonoscopy was in August 2000 . She is undergoing colonoscopy for diagnostic and surveillance purposes. She presently is on no therapy for IBD. 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 Biopsy taken. No evidence of colonic polyps or colitis.  Biopsy results reviewed with patient. She has active small bowel Crohn's. She should continue Pentasa 1 g by mouth 4 times a day.    EGD/Colonoscopy 2004: Chronic GERD, Crohn's disease:   1. Erosive reflux esophagitis except noncritical distal esophageal ring and  has a small sliding hiatal hernia. Esophagus was dilated both by passing  54-French Maloney dilator and also using 18-20 mm balloon.  2. Normal examination of the stomach for a second part of duodenum.  3. Single diverticulum at transverse colon with mild symptoms of  diverticulitis. This does not appear to be causing the symptoms;  nevertheless needs to be treated.  4. Erosions involving the distal small bowel proximal to the ileocolic  anastomosis.  5. Small external hemorrhoids.  6. Suspect her diarrhea is mainly due to bile effect on the colon and  perhaps mild Crohn's disease.  Rt hemicolectomy years ago in [place].   Review of Systems Past Medical History:  Diagnosis Date  . Anxiety  attack   . Arthritis   . Back pain, chronic   . Crohn's colitis ([name])   . Depression (emotion)   . Dysrhythmia   . GERD (gastroesophageal reflux disease)   . Hypertension   . Hypothyroid   . Neuropathy   . Sleep apnea    cpap is not working per patient - needs to find a new doctor - not used since 7/16 per pat   . Vertigo    followed by Jessica [name]- in [place]     Past Surgical History:  Procedure Laterality Date  . ABDOMINAL HYSTERECTOMY    . CHOLECYSTECTOMY    . COLON SURGERY     for Crohn's in the 77s in [place]  . COLONOSCOPY N/A 03/11/2013   Procedure: COLONOSCOPY;  Surgeon: Jessica Houston, MD;  [facility];  Service: Endoscopy;  Laterality: N/A;  1200  . fatty tumor removed    . HARDWARE REMOVAL Right 08/07/2016   Procedure: HARDWARE REMOVAL;  Surgeon: Gaynelle Arabian, MD;  [facility]: [facility];  Service: Orthopedics;  Laterality: Right;  . Hip replacement      Rt hip in 2010 in [place]  . KNEE ARTHROSCOPY Left 03/29/2015   Procedure: ARTHROSCOPY LEFT KNEE WITH MENSICAL DEBRIDEMENT, chondroplasty;  Surgeon: Gaynelle Arabian, MD;  [facility];  Service: Orthopedics;  Laterality: Left;  . ORIF TIBIA PLATEAU Right 02/29/2016   Procedure: OPEN REDUCTION INTERNAL FIXATION (ORIF RIGHT  TIBIAL PLATEAU FRACTURE;  Surgeon: Gaynelle Arabian, MD;  [facility];  Service: Orthopedics;  Laterality: Right;  . TOTAL KNEE ARTHROPLASTY Right 08/25/2017   Procedure: TOTAL RIGHT KNEE ARTHROPLASTY;  Surgeon: Gaynelle Arabian, MD;  [facility];  Service: Orthopedics;  Laterality: Right;    Allergies  Allergen Reactions  . Morphine And Related Hives and Itching    May cause blood pressure to drop  . Robaxin [Methocarbamol] Itching    (PT WILL RESUME TAKING TO SEE IF THIS IS HER ALLERGY)  . Sulfa Antibiotics Itching    ALL SULFA DRUGS    Current Outpatient Medications on File Prior to Visit  Medication Sig Dispense Refill  . diazepam (VALIUM) 5 MG tablet Take 1 tablet (5 mg  total) by mouth every 6 (six) hours as needed for muscle spasms. 80 tablet 0  . diphenoxylate-atropine (LOMOTIL) 2.5-0.025 MG tablet TAKE 1 TABLET BY MOUTH THREE TIMES DAILY AS NEEDED FOR DIARRHEA (Patient taking differently: daily. ) 90 tablet 0  . docusate sodium (COLACE) 100 MG capsule Take 1 capsule (100 mg total) by mouth 2 (two) times daily. (Patient taking differently: Take 100 mg by mouth as needed. ) 10 capsule 0  . fluticasone (FLONASE) 50 MCG/ACT nasal spray Place 1 spray into the nose 2 (two) times daily.     Marland Kitchen gabapentin (NEURONTIN) 300 MG capsule 600 mg 2 (two) times daily.   6  . gabapentin (NEURONTIN) 800 MG tablet Take 800 mg by mouth 3 (three) times daily.     Marland Kitchen HYDROcodone-acetaminophen (NORCO) 10-325 MG tablet Norco 10 mg-325 mg tablet  Take 1 tablet every 4 hours by oral route.    Marland Kitchen levothyroxine (SYNTHROID, LEVOTHROID) 88 MCG tablet Take 88 mcg by mouth daily before breakfast.     . lisinopril (PRINIVIL,ZESTRIL) 20 MG tablet Take 20 mg by mouth daily.    . sertraline (ZOLOFT) 100 MG tablet Take 1 tablet (100 mg total) by mouth at bedtime. 90 tablet 3  . Vitamin D, Ergocalciferol, (DRISDOL) 50000 units CAPS capsule Take 50,000 Units by mouth once a week.  12   No current facility-administered medications on file prior to visit.         Objective:   Physical Exam Blood pressure (!) 150/80, pulse 60, temperature 98.1 F (36.7 C), height 5' 2"  (1.575 m), weight 161 lb 1.6 oz (73.1 kg). Alert and oriented. Skin warm and dry. Oral mucosa is moist.   . Sclera anicteric, conjunctivae is pink. Thyroid not enlarged. No cervical lymphadenopathy. Lungs clear. Heart regular rate and rhythm.  Abdomen is soft. Bowel sounds are positive. No hepatomegaly. No abdominal masses felt. No tenderness.  No edema to lower extremities.           Assessment & Plan:  Crohn's. She seems to be doing well. Am going to get a CBC and sedrate. She is due for a surveillance. Will schedule a  colonoscopy.  OV in 1 year.

## 2018-05-14 NOTE — Telephone Encounter (Signed)
This encounter was created in error - please disregard.

## 2018-06-08 ENCOUNTER — Ambulatory Visit (HOSPITAL_COMMUNITY)
Admission: RE | Admit: 2018-06-08 | Discharge: 2018-06-08 | Disposition: A | Discharge status: Home / Self Care | Payer: Medicare Other | Source: Ambulatory Visit | Attending: Internal Medicine | Admitting: Internal Medicine

## 2018-06-08 ENCOUNTER — Encounter (HOSPITAL_COMMUNITY)
Admission: RE | Disposition: A | Discharge status: Home / Self Care | Payer: Self-pay | Source: Ambulatory Visit | Attending: Internal Medicine

## 2018-06-08 ENCOUNTER — Encounter (HOSPITAL_COMMUNITY): Payer: Self-pay | Admitting: *Deleted

## 2018-06-08 ENCOUNTER — Other Ambulatory Visit: Payer: Self-pay

## 2018-06-08 DIAGNOSIS — I10 Essential (primary) hypertension: Secondary | ICD-10-CM | POA: Diagnosis not present

## 2018-06-08 DIAGNOSIS — K5090 Crohn's disease, unspecified, without complications: Secondary | ICD-10-CM | POA: Diagnosis not present

## 2018-06-08 DIAGNOSIS — K632 Fistula of intestine: Secondary | ICD-10-CM | POA: Insufficient documentation

## 2018-06-08 DIAGNOSIS — K50913 Crohn's disease, unspecified, with fistula: Secondary | ICD-10-CM

## 2018-06-08 DIAGNOSIS — Z09 Encounter for follow-up examination after completed treatment for conditions other than malignant neoplasm: Secondary | ICD-10-CM | POA: Diagnosis not present

## 2018-06-08 DIAGNOSIS — E039 Hypothyroidism, unspecified: Secondary | ICD-10-CM | POA: Insufficient documentation

## 2018-06-08 DIAGNOSIS — Z6830 Body mass index (BMI) 30.0-30.9, adult: Secondary | ICD-10-CM | POA: Diagnosis not present

## 2018-06-08 DIAGNOSIS — K219 Gastro-esophageal reflux disease without esophagitis: Secondary | ICD-10-CM | POA: Diagnosis not present

## 2018-06-08 DIAGNOSIS — Z882 Allergy status to sulfonamides status: Secondary | ICD-10-CM | POA: Diagnosis not present

## 2018-06-08 DIAGNOSIS — Z885 Allergy status to narcotic agent status: Secondary | ICD-10-CM | POA: Insufficient documentation

## 2018-06-08 DIAGNOSIS — G8929 Other chronic pain: Secondary | ICD-10-CM | POA: Insufficient documentation

## 2018-06-08 DIAGNOSIS — Z789 Other specified health status: Secondary | ICD-10-CM | POA: Diagnosis not present

## 2018-06-08 DIAGNOSIS — K5010 Crohn's disease of large intestine without complications: Secondary | ICD-10-CM | POA: Diagnosis not present

## 2018-06-08 DIAGNOSIS — Z48815 Encounter for surgical aftercare following surgery on the digestive system: Secondary | ICD-10-CM | POA: Insufficient documentation

## 2018-06-08 DIAGNOSIS — K633 Ulcer of intestine: Secondary | ICD-10-CM | POA: Insufficient documentation

## 2018-06-08 DIAGNOSIS — F329 Major depressive disorder, single episode, unspecified: Secondary | ICD-10-CM | POA: Insufficient documentation

## 2018-06-08 DIAGNOSIS — Z7951 Long term (current) use of inhaled steroids: Secondary | ICD-10-CM | POA: Diagnosis not present

## 2018-06-08 DIAGNOSIS — G629 Polyneuropathy, unspecified: Secondary | ICD-10-CM | POA: Diagnosis not present

## 2018-06-08 DIAGNOSIS — K648 Other hemorrhoids: Secondary | ICD-10-CM | POA: Insufficient documentation

## 2018-06-08 DIAGNOSIS — G4730 Sleep apnea, unspecified: Secondary | ICD-10-CM | POA: Insufficient documentation

## 2018-06-08 DIAGNOSIS — M1990 Unspecified osteoarthritis, unspecified site: Secondary | ICD-10-CM | POA: Diagnosis not present

## 2018-06-08 DIAGNOSIS — Z299 Encounter for prophylactic measures, unspecified: Secondary | ICD-10-CM | POA: Diagnosis not present

## 2018-06-08 DIAGNOSIS — K6389 Other specified diseases of intestine: Secondary | ICD-10-CM | POA: Diagnosis not present

## 2018-06-08 DIAGNOSIS — M549 Dorsalgia, unspecified: Secondary | ICD-10-CM | POA: Insufficient documentation

## 2018-06-08 DIAGNOSIS — J069 Acute upper respiratory infection, unspecified: Secondary | ICD-10-CM | POA: Diagnosis not present

## 2018-06-08 DIAGNOSIS — N183 Chronic kidney disease, stage 3 (moderate): Secondary | ICD-10-CM | POA: Diagnosis not present

## 2018-06-08 HISTORY — PX: BIOPSY: SHX5522

## 2018-06-08 HISTORY — PX: COLONOSCOPY: SHX5424

## 2018-06-08 SURGERY — COLONOSCOPY
Anesthesia: Moderate Sedation

## 2018-06-08 MED ORDER — MIDAZOLAM HCL 5 MG/5ML IJ SOLN
INTRAMUSCULAR | Status: AC
  Filled 2018-06-08: qty 10

## 2018-06-08 MED ORDER — SODIUM CHLORIDE 0.9 % IV SOLN
INTRAVENOUS | Status: DC
  Administered 2018-06-08: 08:00:00 via INTRAVENOUS

## 2018-06-08 MED ORDER — STERILE WATER FOR IRRIGATION IR SOLN
Status: DC | PRN
  Administered 2018-06-08: 1.5 mL

## 2018-06-08 MED ORDER — MIDAZOLAM HCL 5 MG/5ML IJ SOLN
INTRAMUSCULAR | Status: DC | PRN
  Administered 2018-06-08 (×3): 2 mg via INTRAVENOUS

## 2018-06-08 MED ORDER — MEPERIDINE HCL 50 MG/ML IJ SOLN
INTRAMUSCULAR | Status: AC
  Filled 2018-06-08: qty 1

## 2018-06-08 MED ORDER — MEPERIDINE HCL 50 MG/ML IJ SOLN
INTRAMUSCULAR | Status: DC | PRN
  Administered 2018-06-08 (×2): 25 mg

## 2018-06-08 NOTE — Discharge Instructions (Signed)
Anal Fistula An anal fistula is an abnormal tunnel that develops between the bowel and the skin near the outside of the anus, where stool (feces) comes out. The anus has many tiny glands that make lubricating fluid. Sometimes, these glands become plugged and infected, and that can cause a fluid-filled pocket (abscess) to form. An anal fistula often develops after this infection or abscess. What are the causes? In most cases, an anal fistula is caused by a past or current anal abscess. Other causes include:  A complication of surgery.  Trauma to the rectal area.  Radiation to the area.  Medical conditions or diseases, such as: ? Chronic inflammatory bowel disease, such as Crohn disease or ulcerative colitis. ? Colon cancer or rectal cancer. ? Diverticular disease, such as diverticulitis. ? An STD (sexually transmitted disease), such as gonorrhea, chlamydia, or syphilis. ? An infection that is caused by HIV (human immunodeficiency virus). ? Foreign body in the rectum.  What are the signs or symptoms? Symptoms of this condition include:  Throbbing or constant pain that may be worse while you are sitting.  Swelling or irritation around the anus.  Drainage of pus or blood from an opening near the anus.  Pain with bowel movements.  Fever or chills.  How is this diagnosed? Your health care provider will examine the area to find the openings of the anal fistula and the fistula tract. The external opening of the anal fistula may be seen during a physical exam. You may also have tests, including:  An exam of the rectal area with a gloved hand (digital rectal exam).  An exam with a probe or scope to help locate the internal opening of the fistula.  Imaging tests to find the exact location and path of the fistula. These tests may include X-rays, an ultrasound, a CT scan, or MRI. The path is made visible by a dye that is injected into the fistula opening.  You may have other tests to find  the cause of the anal fistula. How is this treated? The most common treatment for an anal fistula is surgery. The type of surgery that is used will depend on where the fistula is located and how complex the fistula is. Surgical options include:  A fistulotomy. The whole fistula is opened up, and the contents are drained to promote healing.  Seton placement. A silk string (seton) is placed into the fistula during a fistulotomy. This helps to drain any infection to promote healing.  Advancement flap procedure. Tissue is removed from your rectum or the skin around the anus and is attached to the opening of the fistula.  Bioprosthetic plug. A cone-shaped plug is made from your tissue and is used to block the opening of the fistula.  Some anal fistulas do not require surgery. A nonsurgical treatment option involves injecting a fibrin glue to seal the fistula. You also may be prescribed an antibiotic medicine to treat an infection. Follow these instructions at home: Medicines  Take over-the-counter and prescription medicines only as told by your health care provider.  If you were prescribed an antibiotic medicine, take it as told by your health care provider. Do not stop taking the antibiotic even if you start to feel better.  Use a stool softener or a laxative if told to do so by your health care provider. General instructions  Eat a high-fiber diet as told by your health care provider. This can help to prevent constipation.  Drink enough fluid to keep your  urine clear or pale yellow.  Take a warm sitz bath for 15-20 minutes, 3-4 times per day, or as told by your health care provider. Sitz baths can ease your pain and discomfort and help with healing.  Follow good hygiene to keep the anal area as clean and dry as possible. Use wet toilet paper or a moist towelette after each bowel movement.  Keep all follow-up visits as told by your health care provider. This is important. Contact a health  care provider if:  You have increased pain that is not controlled with medicines.  You have new redness or swelling around the anal area.  You have new fluid, blood, or pus coming from the anal area.  You have tenderness or warmth around the anal area. Get help right away if:  You have a fever.  You have severe pain.  You have chills or diarrhea.  You have severe problems urinating or having a bowel movement. This information is not intended to replace advice given to you by your health care provider. Make sure you discuss any questions you have with your health care provider. Document Released: 06/06/2008 Document Revised: 11/30/2015 Document Reviewed: 09/19/2014 Elsevier Interactive Patient Education  2018 [employer]. Colonoscopy, Adult, Care After This sheet gives you information about how to care for yourself after your procedure. Your health care provider may also give you more specific instructions. If you have problems or questions, contact your health care provider. What can I expect after the procedure? After the procedure, it is common to have:  A small amount of blood in your stool for 24 hours after the procedure.  Some gas.  Mild abdominal cramping or bloating.  Follow these instructions at home: General instructions   For the first 24 hours after the procedure: ? Do not drive or use machinery. ? Do not sign important documents. ? Do not drink alcohol. ? Do your regular daily activities at a slower pace than normal. ? Eat soft, easy-to-digest foods. ? Rest often.  Take over-the-counter or prescription medicines only as told by your health care provider.  It is up to you to get the results of your procedure. Ask your health care provider, or the department performing the procedure, when your results will be ready. Relieving cramping and bloating  Try walking around when you have cramps or feel bloated.  Apply heat to your abdomen as told by your  health care provider. Use a heat source that your health care provider recommends, such as a moist heat pack or a heating pad. ? Place a towel between your skin and the heat source. ? Leave the heat on for 20-30 minutes. ? Remove the heat if your skin turns bright red. This is especially important if you are unable to feel pain, heat, or cold. You may have a greater risk of getting burned. Eating and drinking  Drink enough fluid to keep your urine clear or pale yellow.  Resume your normal diet as instructed by your health care provider. Avoid heavy or fried foods that are hard to digest.  Avoid drinking alcohol for as long as instructed by your health care provider. Contact a health care provider if:  You have blood in your stool 2-3 days after the procedure. Get help right away if:  You have more than a small spotting of blood in your stool.  You pass large blood clots in your stool.  Your abdomen is swollen.  You have nausea or vomiting.  You have  a fever.  You have increasing abdominal pain that is not relieved with medicine. This information is not intended to replace advice given to you by your health care provider. Make sure you discuss any questions you have with your health care provider. Document Released: 02/06/2004 Document Revised: 03/18/2016 Document Reviewed: 09/05/2015 Elsevier Interactive Patient Education  2018 [employer]. Hemorrhoids Hemorrhoids are swollen veins in and around the rectum or anus. There are two types of hemorrhoids:  Internal hemorrhoids. These occur in the veins that are just inside the rectum. They may poke through to the outside and become irritated and painful.  External hemorrhoids. These occur in the veins that are outside of the anus and can be felt as a painful swelling or hard lump near the anus.  Most hemorrhoids do not cause serious problems, and they can be managed with home treatments such as diet and lifestyle changes. If home  treatments do not help your symptoms, procedures can be done to shrink or remove the hemorrhoids. What are the causes? This condition is caused by increased pressure in the anal area. This pressure may result from various things, including:  Constipation.  Straining to have a bowel movement.  Diarrhea.  Pregnancy.  Obesity.  Sitting for long periods of time.  Heavy lifting or other activity that causes you to strain.  Anal sex.  What are the signs or symptoms? Symptoms of this condition include:  Pain.  Anal itching or irritation.  Rectal bleeding.  Leakage of stool (feces).  Anal swelling.  One or more lumps around the anus.  How is this diagnosed? This condition can often be diagnosed through a visual exam. Other exams or tests may also be done, such as:  Examination of the rectal area with a gloved hand (digital rectal exam).  Examination of the anal canal using a small tube (anoscope).  A blood test, if you have lost a significant amount of blood.  A test to look inside the colon (sigmoidoscopy or colonoscopy).  How is this treated? This condition can usually be treated at home. However, various procedures may be done if dietary changes, lifestyle changes, and other home treatments do not help your symptoms. These procedures can help make the hemorrhoids smaller or remove them completely. Some of these procedures involve surgery, and others do not. Common procedures include:  Rubber band ligation. Rubber bands are placed at the base of the hemorrhoids to cut off the blood supply to them.  Sclerotherapy. Medicine is injected into the hemorrhoids to shrink them.  Infrared coagulation. A type of light energy is used to get rid of the hemorrhoids.  Hemorrhoidectomy surgery. The hemorrhoids are surgically removed, and the veins that supply them are tied off.  Stapled hemorrhoidopexy surgery. A circular stapling device is used to remove the hemorrhoids and use  staples to cut off the blood supply to them.  Follow these instructions at home: Eating and drinking  Eat foods that have a lot of fiber in them, such as whole grains, beans, nuts, fruits, and vegetables. Ask your health care provider about taking products that have added fiber (fiber supplements).  Drink enough fluid to keep your urine clear or pale yellow. Managing pain and swelling  Take warm sitz baths for 20 minutes, 3-4 times a day to ease pain and discomfort.  If directed, apply ice to the affected area. Using ice packs between sitz baths may be helpful. ? Put ice in a plastic bag. ? Place a towel between your  skin and the bag. ? Leave the ice on for 20 minutes, 2-3 times a day. General instructions  Take over-the-counter and prescription medicines only as told by your health care provider.  Use medicated creams or suppositories as told.  Exercise regularly.  Go to the bathroom when you have the urge to have a bowel movement. Do not wait.  Avoid straining to have bowel movements.  Keep the anal area dry and clean. Use wet toilet paper or moist towelettes after a bowel movement.  Do not sit on the toilet for long periods of time. This increases blood pooling and pain. Contact a health care provider if:  You have increasing pain and swelling that are not controlled by treatment or medicine.  You have uncontrolled bleeding.  You have difficulty having a bowel movement, or you are unable to have a bowel movement.  You have pain or inflammation outside the area of the hemorrhoids. This information is not intended to replace advice given to you by your health care provider. Make sure you discuss any questions you have with your health care provider. Document Released: 06/21/2000 Document Revised: 11/22/2015 Document Reviewed: 03/08/2015 Elsevier Interactive Patient Education  2018 [employer] usual medications as before. Keep Naprosyn/Aleve use to  minimum. Resume usual diet. No driving for 24 hours. Physician will call with biopsy results.

## 2018-06-08 NOTE — Op Note (Signed)
[facility] Patient Name: Jessica [name] Procedure Date: 06/08/2018 7:31 AM MRN: [id number] Date of Birth: 1950-08-09 Attending MD: Hildred Laser , MD CSN: [id number] Age: 67 Admit Type: Outpatient Procedure:                Colonoscopy Indications:              Follow-up of Crohn's disease Providers:                Hildred Laser, MD, Lurline Del, RN, Gerome Sam,                            RN, Nelma Rothman, Technician Referring MD:             Fuller Canada [name], MD Medicines:                Meperidine 50 mg IV, Midazolam 6 mg IV Complications:            No immediate complications. Estimated Blood Loss:     Estimated blood loss was minimal. Procedure:                Pre-Anesthesia Assessment:                           - Prior to the procedure, a History and Physical                            was performed, and patient medications and                            allergies were reviewed. The patient's tolerance of                            previous anesthesia was also reviewed. The risks                            and benefits of the procedure and the sedation                            options and risks were discussed with the patient.                            All questions were answered, and informed consent                            was obtained. Prior Anticoagulants: The patient has                            taken no previous anticoagulant or antiplatelet                            agents. ASA Grade Assessment: II - A patient with                            mild systemic disease. After reviewing the risks  and benefits, the patient was deemed in                            satisfactory condition to undergo the procedure.                           After obtaining informed consent, the colonoscope                            was passed under direct vision. Throughout the                            procedure, the patient's blood pressure, pulse, and                             oxygen saturations were monitored continuously. The                            [id number]-[id number] ([id number]) scope was introduced through                            the anus and advanced to the the ileocolonic                            anastomosis. The colonoscopy was performed without                            difficulty. The patient tolerated the procedure                            well. The quality of the bowel preparation was                            excellent. The terminal ileum and the rectum were                            photographed. Scope In: 8:04:55 AM Scope Out: 8:26:31 AM Scope Withdrawal Time: 0 hours 10 minutes 11 seconds  Total Procedure Duration: 0 hours 21 minutes 36 seconds  Findings:      The perianal and digital rectal examinations were normal.      The neo-terminal ileum appeared normal.      Bleeding ulcerated mucosa with no stigmata of recent bleeding were       present at the anastomosis. Biopsies were taken with a cold forceps for       histology.      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      The exam was otherwise normal throughout the examined colon.      Internal hemorrhoids were found during retroflexion. The hemorrhoids       were small. Impression:               - The examined portion of the ileum was normal.                           - Mucosal ulceration.  Biopsied.                           - Colonic fistula.                           - Internal hemorrhoids. Moderate Sedation:      Moderate (conscious) sedation was administered by the endoscopy nurse       and supervised by the endoscopist. The following parameters were       monitored: oxygen saturation, heart rate, blood pressure, CO2       capnography and response to care. Total physician intraservice time was       27 minutes. Recommendation:           - Patient has a contact number available for                            emergencies. The signs and symptoms of potential                             delayed complications were discussed with the                            patient. Return to normal activities tomorrow.                            Written discharge instructions were provided to the                            patient.                           - Resume previous diet today.                           - Continue present medications.                           - Await pathology results.                           - Repeat colonoscopy in 5 years for surveillance. Procedure Code(s):        --- Professional ---                           [phone], Colonoscopy, flexible; with biopsy, single                            or multiple                           99153, Moderate sedation; each additional 15                            minutes intraservice time                           G0500, Moderate sedation services  provided by the                            same physician or other qualified health care                            professional performing a gastrointestinal                            endoscopic service that sedation supports,                            requiring the presence of an independent trained                            observer to assist in the monitoring of the                            patient's level of consciousness and physiological                            status; initial 15 minutes of intra-service time;                            patient age 28 years or older (additional time may                            be reported with [phone], as appropriate) Diagnosis Code(s):        --- Professional ---                           K64.8, Other hemorrhoids                           K50.913, Crohn's disease, unspecified, with fistula                           K63.3, Ulcer of intestine CPT copyright 2018 American [employer]. All rights reserved. The codes documented in this report are preliminary and upon coder review may  be revised to  meet current compliance requirements. Hildred Laser, MD Hildred Laser, MD 06/08/2018 8:40:35 AM This report has been signed electronically. Number of Addenda: 0

## 2018-06-08 NOTE — H&P (Addendum)
Jessica [name] is an 66 y.o. female.   Chief Complaint: Patient is here for colonoscopy. HPI: Patient is a 67 year old Caucasian female who has over 30-year history of Crohn's disease who is presently on no maintenance therapy was here for surveillance exam.  She says she has 5-6 bowel movements without Lomotil.  With Lomotil which she uses on as-needed basis she may have as he has 3 bowel movements.  She has normal stools here and there.  She noticed occasional blood which she believes is coming from hemorrhoids.  She also complains of intermittent lower abdominal pain.  She has good appetite.  She denies weight loss.  She does not take Aleve very often.  She has right knee pain and uses pain medication most of the time. Last colonoscopy was in September 2014 revealing noncritical ileocolonic stricture and ulcer. Family history is positive for Crohn's disease in her brother and her first cousin who has passed away. Family history is negative for CRC.  Past Medical History:  Diagnosis Date  . Anxiety attack   . Arthritis   . Back pain, chronic   . Crohn's disease ([place])   . Depression (emotion)   . Dysrhythmia   . GERD (gastroesophageal reflux disease)   . Hypertension   . Hypothyroid   . Neuropathy   . Sleep apnea    cpap is not working per patient - needs to find a new doctor - not used since 7/16 per pat   . Vertigo    followed by Dr Wardell Heath- in [place]     Past Surgical History:  Procedure Laterality Date  . ABDOMINAL HYSTERECTOMY    . CHOLECYSTECTOMY    . COLON SURGERY     for Crohn's in the 90s in [place]  . COLONOSCOPY N/A 03/11/2013   Procedure: COLONOSCOPY;  Surgeon: Rogene Houston, MD;  Location: [facility];  Service: Endoscopy;  Laterality: N/A;  1200  . fatty tumor removed    . HARDWARE REMOVAL Right 08/07/2016   Procedure: HARDWARE REMOVAL;  Surgeon: Gaynelle Arabian, MD;  Location: [facility];  Service: Orthopedics;  Laterality: Right;  . Hip replacement      Rt hip in 2010  in [place]  . KNEE ARTHROSCOPY Left 03/29/2015   Procedure: ARTHROSCOPY LEFT KNEE WITH MENSICAL DEBRIDEMENT, chondroplasty;  Surgeon: Gaynelle Arabian, MD;  Location: [facility];  Service: Orthopedics;  Laterality: Left;  . ORIF TIBIA PLATEAU Right 02/29/2016   Procedure: OPEN REDUCTION INTERNAL FIXATION (ORIF RIGHT  TIBIAL PLATEAU FRACTURE;  Surgeon: Gaynelle Arabian, MD;  Location: [facility];  Service: Orthopedics;  Laterality: Right;  . TOTAL KNEE ARTHROPLASTY Right 08/25/2017   Procedure: TOTAL RIGHT KNEE ARTHROPLASTY;  Surgeon: Gaynelle Arabian, MD;  Location: [facility];  Service: Orthopedics;  Laterality: Right;    Family History  Problem Relation Age of Onset  . Crohn's disease Brother   . Depression Mother   . Anxiety disorder Mother   . Depression Sister   . Aneurysm Father   . Colon cancer Neg Hx    Social History:  reports that she has never smoked. She has never used smokeless tobacco. She reports that she does not drink alcohol or use drugs.  Allergies:  Allergies  Allergen Reactions  . Morphine And Related Hives and Itching    May cause blood pressure to drop  . Robaxin [Methocarbamol] Itching    (PT WILL RESUME TAKING TO SEE IF THIS IS HER ALLERGY)  . Sulfa Antibiotics Itching    ALL SULFA  DRUGS    Medications Prior to Admission  Medication Sig Dispense Refill  . cetirizine (ZYRTEC) 10 MG tablet Take 10 mg by mouth daily as needed for allergies.    . diazepam (VALIUM) 5 MG tablet Take 1 tablet (5 mg total) by mouth every 6 (six) hours as needed for muscle spasms. 80 tablet 0  . diphenoxylate-atropine (LOMOTIL) 2.5-0.025 MG tablet TAKE 1 TABLET BY MOUTH THREE TIMES DAILY AS NEEDED FOR DIARRHEA (Patient taking differently: Take 1 tablet by mouth 3 (three) times daily. ) 90 tablet 0  . docusate sodium (COLACE) 100 MG capsule Take 1 capsule (100 mg total) by mouth 2 (two) times daily. (Patient taking differently: Take 100 mg by mouth as needed for mild constipation. ) 10 capsule 0  .  ferrous gluconate (FERGON) 324 MG tablet Take 324 mg by mouth 2 (two) times daily.    . fluticasone (FLONASE) 50 MCG/ACT nasal spray Place 1 spray into the nose 2 (two) times daily.     Marland Kitchen gabapentin (NEURONTIN) 300 MG capsule Take 300 mg by mouth 3 (three) times daily as needed (pain).   6  . HYDROcodone-acetaminophen (NORCO/VICODIN) 5-325 MG tablet Take 1 tablet by mouth 2 (two) times daily as needed for moderate pain.    Marland Kitchen levothyroxine (SYNTHROID, LEVOTHROID) 88 MCG tablet Take 88 mcg by mouth daily before breakfast.     . lisinopril (PRINIVIL,ZESTRIL) 20 MG tablet Take 20 mg by mouth every evening.     . Multiple Vitamin (MULTIVITAMIN WITH MINERALS) TABS tablet Take 1 tablet by mouth daily.    . sertraline (ZOLOFT) 100 MG tablet Take 1 tablet (100 mg total) by mouth at bedtime. (Patient taking differently: Take 100 mg by mouth daily. ) 90 tablet 3  . Vitamin D, Ergocalciferol, (DRISDOL) 50000 units CAPS capsule Take 50,000 Units by mouth every Monday.   12  . naproxen sodium (ALEVE) 220 MG tablet Take 660 mg by mouth daily as needed (pain).      No results found for this or any previous visit (from the past 48 hour(s)). No results found.  ROS  Blood pressure (!) 130/92, pulse 83, temperature (!) 97 F (36.1 C), temperature source Axillary, resp. rate 15, SpO2 100 %. Physical Exam  Constitutional: She appears well-developed and well-nourished.  HENT:  Mouth/Throat: Oropharynx is clear and moist.  Eyes: Conjunctivae are normal. No scleral icterus.  Neck: No thyromegaly present.  Cardiovascular: Normal rate, regular rhythm and normal heart sounds.  No murmur heard. Respiratory: Effort normal and breath sounds normal.  GI:  Abdomen is symmetrical.  Lower midline scar.  Abdomen is soft and nontender with organomegaly or masses.  Musculoskeletal: She exhibits no edema.  Long scar over anterior aspect of right knee and proximal leg  Lymphadenopathy:    She has no cervical adenopathy.   Neurological: She is alert.  Skin: Skin is warm.     Assessment/Plan Crohn's disease. Colonoscopy to assess disease activity and for surveillance.  Hildred Laser, MD 06/08/2018, 7:52 AM

## 2018-06-15 ENCOUNTER — Encounter (HOSPITAL_COMMUNITY): Payer: Self-pay | Admitting: Internal Medicine

## 2018-06-15 DIAGNOSIS — J310 Chronic rhinitis: Secondary | ICD-10-CM | POA: Diagnosis not present

## 2018-06-15 DIAGNOSIS — Z299 Encounter for prophylactic measures, unspecified: Secondary | ICD-10-CM | POA: Diagnosis not present

## 2018-06-15 DIAGNOSIS — Z6830 Body mass index (BMI) 30.0-30.9, adult: Secondary | ICD-10-CM | POA: Diagnosis not present

## 2018-06-15 DIAGNOSIS — I10 Essential (primary) hypertension: Secondary | ICD-10-CM | POA: Diagnosis not present

## 2018-06-28 ENCOUNTER — Other Ambulatory Visit (INDEPENDENT_AMBULATORY_CARE_PROVIDER_SITE_OTHER): Payer: Self-pay | Admitting: Internal Medicine

## 2018-06-28 DIAGNOSIS — R197 Diarrhea, unspecified: Secondary | ICD-10-CM

## 2018-07-02 ENCOUNTER — Other Ambulatory Visit (INDEPENDENT_AMBULATORY_CARE_PROVIDER_SITE_OTHER): Payer: Self-pay | Admitting: Internal Medicine

## 2018-07-02 DIAGNOSIS — R197 Diarrhea, unspecified: Secondary | ICD-10-CM

## 2018-07-02 MED ORDER — DIPHENOXYLATE-ATROPINE 2.5-0.025 MG PO TABS: ORAL_TABLET | ORAL | 0 refills | Status: DC

## 2018-07-22 DIAGNOSIS — G5603 Carpal tunnel syndrome, bilateral upper limbs: Secondary | ICD-10-CM | POA: Diagnosis not present

## 2018-07-22 DIAGNOSIS — R270 Ataxia, unspecified: Secondary | ICD-10-CM | POA: Diagnosis not present

## 2018-07-22 DIAGNOSIS — M5442 Lumbago with sciatica, left side: Secondary | ICD-10-CM | POA: Diagnosis not present

## 2018-07-22 DIAGNOSIS — G603 Idiopathic progressive neuropathy: Secondary | ICD-10-CM | POA: Diagnosis not present

## 2018-07-22 DIAGNOSIS — M5441 Lumbago with sciatica, right side: Secondary | ICD-10-CM | POA: Diagnosis not present

## 2018-07-22 DIAGNOSIS — M5412 Radiculopathy, cervical region: Secondary | ICD-10-CM | POA: Diagnosis not present

## 2018-08-06 ENCOUNTER — Ambulatory Visit (INDEPENDENT_AMBULATORY_CARE_PROVIDER_SITE_OTHER): Payer: Medicare Other | Admitting: Psychiatry

## 2018-08-06 ENCOUNTER — Encounter (HOSPITAL_COMMUNITY): Payer: Self-pay | Admitting: Psychiatry

## 2018-08-06 DIAGNOSIS — F331 Major depressive disorder, recurrent, moderate: Secondary | ICD-10-CM | POA: Diagnosis not present

## 2018-08-06 MED ORDER — DIAZEPAM 5 MG PO TABS: 5.0000 mg | ORAL_TABLET | Freq: Every day | ORAL | 2 refills | Status: DC

## 2018-08-06 MED ORDER — SERTRALINE HCL 100 MG PO TABS: 150.0000 mg | ORAL_TABLET | Freq: Every day | ORAL | 3 refills | Status: DC

## 2018-08-06 NOTE — Progress Notes (Signed)
[facility] MD/PA/NP OP Progress Note  08/06/2018 1:46 PM Jessica [name]  MRN:  [id number]  Chief Complaint:  Chief Complaint    Anxiety; Depression; Follow-up     HPI: This patient is a 68 year old married white female who lives with her husband in [place].  She has 2 grown sons.  She retired as a Engineer, structural about 3 years ago.  The patient returns for follow-up with her husband after 6 months.  She states that she has been very upset and angry lately.  She states that her husband's 2 sisters and brother all died of substance abuse overdoses or complications.  She is very angry because she states that the husband's mother allowed all this to happen over the years by enabling them to live with her and giving the money etc.  Her brother-in-law died in Jun 29, 2023 and since then she has been very angry at her mother-in-law and even at her husband feeling like that they could have done something to stop all this.  We spoke at length about the effects of addiction and how little family members can do if the addicted person does not want to get help.  Apparently she has been yelling a lot at her husband going into rages and having difficulty sleeping.  She does get out with a friend and exercises and walks.  I strongly suggested going to Al-Anon meetings and coming here for counseling.  She was tearful today but eventually started to understand that there is not much that she can do about this whole situation.  The mother in law is now living in assisted living and all of the other kids have died.  She had been on Valium for a while and I reinstated this slightly she can get some sleep and try to calm down.  We will also increase her Zoloft Visit Diagnosis:    ICD-10-CM   1. Major depressive disorder, recurrent episode, moderate (HCC) F33.1 sertraline (ZOLOFT) 100 MG tablet    Past Psychiatric History: none  Past Medical History:  Past Medical History:  Diagnosis Date  . Anxiety attack   . Arthritis    . Back pain, chronic   . Crohn's colitis ([place])   . Depression (emotion)   . Dysrhythmia   . GERD (gastroesophageal reflux disease)   . Hypertension   . Hypothyroid   . Neuropathy   . Sleep apnea    cpap is not working per patient - needs to find a new doctor - not used since 7/16 per pat   . Vertigo    followed by Dr Wardell Heath- in [place]     Past Surgical History:  Procedure Laterality Date  . ABDOMINAL HYSTERECTOMY    . BIOPSY  06/08/2018   Procedure: BIOPSY;  Surgeon: Rogene Houston, MD;  [facility];  Service: Endoscopy;;  (colon)  . CHOLECYSTECTOMY    . COLON SURGERY     for Crohn's in the 15s in [place]  . COLONOSCOPY N/A 03/11/2013   Procedure: COLONOSCOPY;  Surgeon: Rogene Houston, MD;  [facility];  Service: Endoscopy;  Laterality: N/A;  1200  . COLONOSCOPY N/A 06/08/2018   Procedure: COLONOSCOPY;  Surgeon: Rogene Houston, MD;  [facility]: [facility];  Service: Endoscopy;  Laterality: N/A;  8:25  . fatty tumor removed    . HARDWARE REMOVAL Right 08/07/2016   Procedure: HARDWARE REMOVAL;  Surgeon: Gaynelle Arabian, MD;  Location: [facility];  Service: Orthopedics;  Laterality: Right;  . Hip  replacement      Rt hip in 2010 in [place]  . KNEE ARTHROSCOPY Left 03/29/2015   Procedure: ARTHROSCOPY LEFT KNEE WITH MENSICAL DEBRIDEMENT, chondroplasty;  Surgeon: Gaynelle Arabian, MD;  Location: [facility];  Service: Orthopedics;  Laterality: Left;  . ORIF TIBIA PLATEAU Right 02/29/2016   Procedure: OPEN REDUCTION INTERNAL FIXATION (ORIF RIGHT  TIBIAL PLATEAU FRACTURE;  Surgeon: Gaynelle Arabian, MD;  Location: [facility];  Service: Orthopedics;  Laterality: Right;  . TOTAL KNEE ARTHROPLASTY Right 08/25/2017   Procedure: TOTAL RIGHT KNEE ARTHROPLASTY;  Surgeon: Gaynelle Arabian, MD;  Location: [facility];  Service: Orthopedics;  Laterality: Right;    Family Psychiatric History: See below  Family History:  Family History  Problem Relation Age of Onset  . Crohn's disease Brother    . Depression Mother   . Anxiety disorder Mother   . Depression Sister   . Aneurysm Father   . Colon cancer Neg Hx     Social History:  Social History   Socioeconomic History  . Marital status: Married    Spouse name: Not on file  . Number of children: Not on file  . Years of education: Not on file  . Highest education level: Not on file  Occupational History  . Not on file  Social Needs  . Financial resource strain: Not on file  . Food insecurity:    Worry: Not on file    Inability: Not on file  . Transportation needs:    Medical: Not on file    Non-medical: Not on file  Tobacco Use  . Smoking status: Never Smoker  . Smokeless tobacco: Never Used  Substance and Sexual Activity  . Alcohol use: No    Alcohol/week: 0.0 standard drinks  . Drug use: No  . Sexual activity: Not Currently  Lifestyle  . Physical activity:    Days per week: Not on file    Minutes per session: Not on file  . Stress: Not on file  Relationships  . Social connections:    Talks on phone: Not on file    Gets together: Not on file    Attends religious service: Not on file    Active member of club or organization: Not on file    Attends meetings of clubs or organizations: Not on file    Relationship status: Not on file  Other Topics Concern  . Not on file  Social History Narrative  . Not on file    Allergies:  Allergies  Allergen Reactions  . Morphine And Related Hives and Itching    May cause blood pressure to drop  . Robaxin [Methocarbamol] Itching    (PT WILL RESUME TAKING TO SEE IF THIS IS HER ALLERGY)  . Sulfa Antibiotics Itching    ALL SULFA DRUGS    Metabolic Disorder Labs: No results found for: HGBA1C, MPG No results found for: PROLACTIN No results found for: CHOL, TRIG, HDL, CHOLHDL, VLDL, LDLCALC No results found for: TSH  Therapeutic Level Labs: No results found for: LITHIUM No results found for: VALPROATE No components found for:  CBMZ  Current  Medications: Current Outpatient Medications  Medication Sig Dispense Refill  . cetirizine (ZYRTEC) 10 MG tablet Take 10 mg by mouth daily as needed for allergies.    . diazepam (VALIUM) 5 MG tablet Take 1 tablet (5 mg total) by mouth at bedtime. 30 tablet 2  . diphenoxylate-atropine (LOMOTIL) 2.5-0.025 MG tablet TAKE 1 TABLET BY MOUTH THREE TIMES DAILY AS NEEDED FOR  DIARRHEA  90 tablet 0  . docusate sodium (COLACE) 100 MG capsule Take 1 capsule (100 mg total) by mouth 2 (two) times daily. (Patient taking differently: Take 100 mg by mouth as needed for mild constipation. ) 10 capsule 0  . ferrous gluconate (FERGON) 324 MG tablet Take 324 mg by mouth 2 (two) times daily.    . fluticasone (FLONASE) 50 MCG/ACT nasal spray Place 1 spray into the nose 2 (two) times daily.     Marland Kitchen gabapentin (NEURONTIN) 300 MG capsule Take 300 mg by mouth 3 (three) times daily as needed (pain).   6  . HYDROcodone-acetaminophen (NORCO/VICODIN) 5-325 MG tablet Take 1 tablet by mouth 2 (two) times daily as needed for moderate pain.    Marland Kitchen levothyroxine (SYNTHROID, LEVOTHROID) 88 MCG tablet Take 88 mcg by mouth daily before breakfast.     . lisinopril (PRINIVIL,ZESTRIL) 20 MG tablet Take 20 mg by mouth every evening.     . Multiple Vitamin (MULTIVITAMIN WITH MINERALS) TABS tablet Take 1 tablet by mouth daily.    . naproxen sodium (ALEVE) 220 MG tablet Take 2 tablets (440 mg total) by mouth daily as needed (pain).    Marland Kitchen sertraline (ZOLOFT) 100 MG tablet Take 1.5 tablets (150 mg total) by mouth daily. 135 tablet 3  . Vitamin D, Ergocalciferol, (DRISDOL) 50000 units CAPS capsule Take 50,000 Units by mouth every Monday.   12   No current facility-administered medications for this visit.      Musculoskeletal: Strength & Muscle Tone: within normal limits Gait & Station: normal Patient leans: N/A  Psychiatric Specialty Exam: Review of Systems  Musculoskeletal: Positive for joint pain.  Psychiatric/Behavioral: Positive for  depression. The patient is nervous/anxious and has insomnia.   All other systems reviewed and are negative.   Blood pressure 123/79, pulse 82, height 5' 2"  (1.575 m), weight 164 lb (74.4 kg), SpO2 98 %.Body mass index is 30 kg/m.  General Appearance: Casual and Fairly Groomed  Eye Contact:  Good  Speech:  Clear and Coherent  Volume:  Normal  Mood:  Anxious, Depressed and Irritable  Affect:  Depressed and Tearful  Thought Process:  Goal Directed  Orientation:  Full (Time, Place, and Person)  Thought Content: Rumination   Suicidal Thoughts:  No  Homicidal Thoughts:  No  Memory:  Immediate;   Good Recent;   Good Remote;   Good  Judgement:  Poor  Insight:  Shallow  Psychomotor Activity:  Normal  Concentration:  Concentration: Good and Attention Span: Good  Recall:  Good  Fund of Knowledge: Fair  Language: Good  Akathisia:  No  Handed:  Right  AIMS (if indicated): not done  Assets:  Communication Skills Desire for Improvement  ADL's:  Intact  Cognition: WNL  Sleep:  Fair   Screenings:   Assessment and Plan: This patient is a 68 year old female with a history of depression anxiety and insomnia.  She has become more agitated and upset over her husband's family which has been riddled with addiction related deaths.  We discussed this at length and she seems to be more in tune with the fact that she cannot do anything about it.  She is agreeable to counseling here.  We will increase Zoloft to 150 mg daily and start Valium 5 mg at bedtime for agitation.  She will return to see me in 4 weeks   [name], MD 08/06/2018, 1:46 PM

## 2018-08-24 DIAGNOSIS — I10 Essential (primary) hypertension: Secondary | ICD-10-CM | POA: Diagnosis not present

## 2018-08-24 DIAGNOSIS — Z789 Other specified health status: Secondary | ICD-10-CM | POA: Diagnosis not present

## 2018-08-24 DIAGNOSIS — J309 Allergic rhinitis, unspecified: Secondary | ICD-10-CM | POA: Diagnosis not present

## 2018-08-24 DIAGNOSIS — Z299 Encounter for prophylactic measures, unspecified: Secondary | ICD-10-CM | POA: Diagnosis not present

## 2018-08-24 DIAGNOSIS — Z6831 Body mass index (BMI) 31.0-31.9, adult: Secondary | ICD-10-CM | POA: Diagnosis not present

## 2018-09-07 ENCOUNTER — Ambulatory Visit (HOSPITAL_COMMUNITY): Payer: Medicare Other | Admitting: Psychiatry

## 2018-09-16 ENCOUNTER — Encounter (HOSPITAL_COMMUNITY): Payer: Self-pay | Admitting: Licensed Clinical Social Worker

## 2018-09-16 ENCOUNTER — Ambulatory Visit (INDEPENDENT_AMBULATORY_CARE_PROVIDER_SITE_OTHER): Payer: Medicare Other | Admitting: Licensed Clinical Social Worker

## 2018-09-16 ENCOUNTER — Other Ambulatory Visit: Payer: Self-pay

## 2018-09-16 ENCOUNTER — Ambulatory Visit (HOSPITAL_COMMUNITY): Payer: Medicare Other | Admitting: Licensed Clinical Social Worker

## 2018-09-16 DIAGNOSIS — F331 Major depressive disorder, recurrent, moderate: Secondary | ICD-10-CM
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
## 2018-09-22 DIAGNOSIS — I10 Essential (primary) hypertension: Secondary | ICD-10-CM | POA: Diagnosis not present

## 2018-09-22 DIAGNOSIS — Z299 Encounter for prophylactic measures, unspecified: Secondary | ICD-10-CM | POA: Diagnosis not present

## 2018-09-22 DIAGNOSIS — Z713 Dietary counseling and surveillance: Secondary | ICD-10-CM | POA: Diagnosis not present

## 2018-09-22 DIAGNOSIS — R509 Fever, unspecified: Secondary | ICD-10-CM | POA: Diagnosis not present

## 2018-09-22 DIAGNOSIS — Z6831 Body mass index (BMI) 31.0-31.9, adult: Secondary | ICD-10-CM | POA: Diagnosis not present

## 2018-10-01 ENCOUNTER — Other Ambulatory Visit (INDEPENDENT_AMBULATORY_CARE_PROVIDER_SITE_OTHER): Payer: Self-pay | Admitting: Internal Medicine

## 2018-10-01 DIAGNOSIS — R197 Diarrhea, unspecified: Secondary | ICD-10-CM

## 2018-11-03 DIAGNOSIS — G5603 Carpal tunnel syndrome, bilateral upper limbs: Secondary | ICD-10-CM | POA: Diagnosis not present

## 2018-11-03 DIAGNOSIS — M5441 Lumbago with sciatica, right side: Secondary | ICD-10-CM | POA: Diagnosis not present

## 2018-11-03 DIAGNOSIS — M5412 Radiculopathy, cervical region: Secondary | ICD-10-CM | POA: Diagnosis not present

## 2018-11-03 DIAGNOSIS — R270 Ataxia, unspecified: Secondary | ICD-10-CM | POA: Diagnosis not present

## 2018-11-03 DIAGNOSIS — G603 Idiopathic progressive neuropathy: Secondary | ICD-10-CM | POA: Diagnosis not present

## 2018-11-03 DIAGNOSIS — M5442 Lumbago with sciatica, left side: Secondary | ICD-10-CM | POA: Diagnosis not present

## 2018-11-08 ENCOUNTER — Other Ambulatory Visit (INDEPENDENT_AMBULATORY_CARE_PROVIDER_SITE_OTHER): Payer: Self-pay | Admitting: Internal Medicine

## 2018-11-08 DIAGNOSIS — R197 Diarrhea, unspecified: Secondary | ICD-10-CM

## 2018-11-10 DIAGNOSIS — Z1339 Encounter for screening examination for other mental health and behavioral disorders: Secondary | ICD-10-CM | POA: Diagnosis not present

## 2018-11-10 DIAGNOSIS — Z79899 Other long term (current) drug therapy: Secondary | ICD-10-CM | POA: Diagnosis not present

## 2018-11-10 DIAGNOSIS — D509 Iron deficiency anemia, unspecified: Secondary | ICD-10-CM | POA: Diagnosis not present

## 2018-11-10 DIAGNOSIS — Z299 Encounter for prophylactic measures, unspecified: Secondary | ICD-10-CM | POA: Diagnosis not present

## 2018-11-10 DIAGNOSIS — Z1331 Encounter for screening for depression: Secondary | ICD-10-CM | POA: Diagnosis not present

## 2018-11-10 DIAGNOSIS — Z7189 Other specified counseling: Secondary | ICD-10-CM | POA: Diagnosis not present

## 2018-11-10 DIAGNOSIS — E039 Hypothyroidism, unspecified: Secondary | ICD-10-CM | POA: Diagnosis not present

## 2018-11-10 DIAGNOSIS — Z0000 Encounter for general adult medical examination without abnormal findings: Secondary | ICD-10-CM | POA: Diagnosis not present

## 2018-11-10 DIAGNOSIS — Z1211 Encounter for screening for malignant neoplasm of colon: Secondary | ICD-10-CM | POA: Diagnosis not present

## 2018-11-10 DIAGNOSIS — R5383 Other fatigue: Secondary | ICD-10-CM | POA: Diagnosis not present

## 2018-11-24 DIAGNOSIS — D509 Iron deficiency anemia, unspecified: Secondary | ICD-10-CM | POA: Diagnosis not present

## 2018-12-29 ENCOUNTER — Ambulatory Visit (INDEPENDENT_AMBULATORY_CARE_PROVIDER_SITE_OTHER): Payer: Medicare Other | Admitting: Podiatry

## 2018-12-29 ENCOUNTER — Other Ambulatory Visit: Payer: Self-pay

## 2018-12-29 ENCOUNTER — Encounter: Payer: Self-pay | Admitting: Podiatry

## 2018-12-29 VITALS — Temp 98.3°F

## 2018-12-29 DIAGNOSIS — M7751 Other enthesopathy of right foot: Secondary | ICD-10-CM

## 2018-12-29 DIAGNOSIS — M7752 Other enthesopathy of left foot: Secondary | ICD-10-CM | POA: Diagnosis not present

## 2018-12-30 NOTE — Progress Notes (Signed)
She presents today for follow-up of her subtalar joint capsulitis.  She states they will do okay for a while but will start to hurt again.  She states they started to bother her again about a month ago.  Objective: Vital signs are stable she is alert and oriented x3.  Pulses are palpable.  She has pain on palpation of the sinus tarsi and on end range of motion of the subtalar joint bilaterally.  Assessment: Subtalar joint capsulitis.  Plan: I discussed the use of orthotics with her she declined them.  Citing that she does not have the money at this time.  I also offered her an injection which she did take 20 mg Kenalog 5 mg Marcaine subtalar joint after Betadine skin prep bilaterally.  Tolerated procedure well follow-up with her as needed.

## 2019-01-13 DIAGNOSIS — H9193 Unspecified hearing loss, bilateral: Secondary | ICD-10-CM | POA: Diagnosis not present

## 2019-01-13 DIAGNOSIS — I10 Essential (primary) hypertension: Secondary | ICD-10-CM | POA: Diagnosis not present

## 2019-01-13 DIAGNOSIS — E039 Hypothyroidism, unspecified: Secondary | ICD-10-CM | POA: Diagnosis not present

## 2019-01-13 DIAGNOSIS — Z299 Encounter for prophylactic measures, unspecified: Secondary | ICD-10-CM | POA: Diagnosis not present

## 2019-01-13 DIAGNOSIS — Z6831 Body mass index (BMI) 31.0-31.9, adult: Secondary | ICD-10-CM | POA: Diagnosis not present

## 2019-01-13 DIAGNOSIS — G629 Polyneuropathy, unspecified: Secondary | ICD-10-CM | POA: Diagnosis not present

## 2019-01-14 DIAGNOSIS — H903 Sensorineural hearing loss, bilateral: Secondary | ICD-10-CM | POA: Diagnosis not present

## 2019-01-15 DIAGNOSIS — H903 Sensorineural hearing loss, bilateral: Secondary | ICD-10-CM | POA: Diagnosis not present

## 2019-01-15 DIAGNOSIS — H6123 Impacted cerumen, bilateral: Secondary | ICD-10-CM | POA: Diagnosis not present

## 2019-01-20 ENCOUNTER — Other Ambulatory Visit: Payer: Self-pay | Admitting: Otolaryngology

## 2019-01-21 ENCOUNTER — Other Ambulatory Visit: Payer: Self-pay | Admitting: Otolaryngology

## 2019-01-21 DIAGNOSIS — H905 Unspecified sensorineural hearing loss: Secondary | ICD-10-CM

## 2019-02-10 DIAGNOSIS — I10 Essential (primary) hypertension: Secondary | ICD-10-CM | POA: Diagnosis not present

## 2019-02-10 DIAGNOSIS — Z299 Encounter for prophylactic measures, unspecified: Secondary | ICD-10-CM | POA: Diagnosis not present

## 2019-02-10 DIAGNOSIS — Z6830 Body mass index (BMI) 30.0-30.9, adult: Secondary | ICD-10-CM | POA: Diagnosis not present

## 2019-02-10 DIAGNOSIS — E039 Hypothyroidism, unspecified: Secondary | ICD-10-CM | POA: Diagnosis not present

## 2019-02-10 DIAGNOSIS — E785 Hyperlipidemia, unspecified: Secondary | ICD-10-CM | POA: Diagnosis not present

## 2019-02-10 DIAGNOSIS — T148XXA Other injury of unspecified body region, initial encounter: Secondary | ICD-10-CM | POA: Diagnosis not present

## 2019-02-17 ENCOUNTER — Ambulatory Visit (INDEPENDENT_AMBULATORY_CARE_PROVIDER_SITE_OTHER): Payer: Medicare Other | Admitting: Nurse Practitioner

## 2019-02-17 ENCOUNTER — Other Ambulatory Visit: Payer: Self-pay

## 2019-02-17 ENCOUNTER — Encounter (INDEPENDENT_AMBULATORY_CARE_PROVIDER_SITE_OTHER): Payer: Self-pay | Admitting: Nurse Practitioner

## 2019-02-17 VITALS — BP 127/76 | HR 80 | Temp 98.5°F | Resp 18 | Ht 62.0 in | Wt 162.1 lb

## 2019-02-17 DIAGNOSIS — K50913 Crohn's disease, unspecified, with fistula: Secondary | ICD-10-CM

## 2019-02-17 NOTE — Progress Notes (Signed)
Subjective:    Patient ID: Jessica [name], female    DOB: 04/28/1951, 68 y.o.   MRN: [id number] HPI  Jessica [name] is a 68 y.o. 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 She presents today with complaints of having loose stools and occasional fecal incontinence. She took Pentasa in the past. She prefers not to take any medication for her Crohn's disease. She is taking Lomotil 1 tab tid which controlled her diarrhea in the past but it is no longer effective. She denies having any abdominal pain. No blood in her stool. No mucous per the rectum. Infrequent NSAID use, she knows to avoid. No recent antibiotics. No weight loss.   Her most recent colonoscopy was 06/08/2018 which showed the neo-terminal ileum appeared normal. Bleeding ulcerated mucosa with no stigmata of recent bleeding were present at the anastomosis. 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 The exam was otherwise normal throughout the examined colon.  Colon biopsies showed:  - BENIGN SMALL BOWEL WITH REACTIVE CHANGES. - NO ACTIVE INFLAMMATION. - NO DYSPLASIA OR MALIGNANCY.  Repeat colonoscopy in 5 years was recommended.   She declined further Crohn's medication/treatment  Past Medical History:  Diagnosis Date  . Anxiety attack   . Arthritis   . Back pain, chronic   . Crohn's colitis ([place])   . Depression (emotion)   . Dysrhythmia   . GERD (gastroesophageal reflux disease)   . Hypertension   . Hypothyroid   . Neuropathy   . Sleep apnea    cpap is not working per patient - needs to find a new doctor - not used since 7/16 per pat   . Vertigo    followed by Dr Wardell Heath- in [place]    Past Surgical History:  Procedure Laterality Date  . ABDOMINAL HYSTERECTOMY    . BIOPSY  06/08/2018   Procedure: BIOPSY;  Surgeon: Rogene Houston, MD;  [facility]: [facility];  Service: Endoscopy;;  (colon)  .  CHOLECYSTECTOMY    . COLON SURGERY     for Crohn's in the 18s in [place]  . COLONOSCOPY N/A 03/11/2013   Procedure: COLONOSCOPY;  Surgeon: Rogene Houston, MD;  Location: [facility];  Service: Endoscopy;  Laterality: N/A;  1200  . COLONOSCOPY N/A 06/08/2018   Procedure: COLONOSCOPY;  Surgeon: Rogene Houston, MD;  Location: [facility];  Service: Endoscopy;  Laterality: N/A;  8:25  . fatty tumor removed    . HARDWARE REMOVAL Right 08/07/2016   Procedure: HARDWARE REMOVAL;  Surgeon: Gaynelle Arabian, MD;  Location: [facility];  Service: Orthopedics;  Laterality: Right;  . Hip replacement      Rt hip in 2010 in [place]  . KNEE ARTHROSCOPY Left 03/29/2015   Procedure: ARTHROSCOPY LEFT KNEE WITH MENSICAL DEBRIDEMENT, chondroplasty;  Surgeon: Gaynelle Arabian, MD;  Location: [facility];  Service: Orthopedics;  Laterality: Left;  . ORIF TIBIA PLATEAU Right 02/29/2016   Procedure: OPEN REDUCTION INTERNAL FIXATION (ORIF RIGHT  TIBIAL PLATEAU FRACTURE;  Surgeon: Gaynelle Arabian, MD;  Location: [facility];  Service: Orthopedics;  Laterality: Right;  . TOTAL KNEE ARTHROPLASTY Right 08/25/2017   Procedure: TOTAL RIGHT KNEE ARTHROPLASTY;  Surgeon: Gaynelle Arabian, MD;  Location: [facility];  Service: Orthopedics;  Laterality: Right;   Current Outpatient Medications on File Prior to Visit  Medication Sig Dispense Refill  . ALLERGY RELIEF 180 MG tablet Take 180 mg  by mouth daily.    . Ascorbic Acid (VITAMIN C) 1000 MG tablet Take 2,000 mg by mouth daily.    . diphenoxylate-atropine (LOMOTIL) 2.5-0.025 MG tablet TAKE 1 TABLET BY MOUTH THREE TIMES DAILY AS NEEDED FOR DIARRHEA 90 tablet 2  . EUTHYROX 75 MCG tablet Take 75 mcg by mouth daily.    . fluticasone (FLONASE) 50 MCG/ACT nasal spray Place 1 spray into the nose 2 (two) times daily.     Marland Kitchen gabapentin (NEURONTIN) 400 MG capsule TAKE 1 CAPSULE BY MOUTH 4 TIMES DAILY FOR 30 DAYS    . HYDROcodone-acetaminophen (NORCO/VICODIN) 5-325 MG tablet Take 1 tablet by mouth 2 (two) times  daily as needed for moderate pain.    Marland Kitchen lisinopril (PRINIVIL,ZESTRIL) 20 MG tablet Take 20 mg by mouth every evening.     . Multiple Vitamin (MULTIVITAMIN WITH MINERALS) TABS tablet Take 1 tablet by mouth daily.    . sertraline (ZOLOFT) 100 MG tablet Take 1.5 tablets (150 mg total) by mouth daily. 135 tablet 3   No current facility-administered medications on file prior to visit.    Allergies  Allergen Reactions  . Morphine And Related Hives and Itching    May cause blood pressure to drop  . Robaxin [Methocarbamol] Itching    (PT WILL RESUME TAKING TO SEE IF THIS IS HER ALLERGY)  . Sulfa Antibiotics Itching    ALL SULFA DRUGS   Review of Systems See HPI, all other systems reviewed and are negative.    Objective:   Physical Exam  Blood pressure 127/76, pulse 80, temperature 98.5 F (36.9 C), temperature source Oral, resp. rate 18, height 5' 2"  (1.575 m), weight 162 lb 1.6 oz (73.5 kg).  General: 68 year old female well-developed in no acute distress. Eyes: Sclera nonicteric, conjunctiva pink Mouth: Dentition intact Neck: Supple, no lymphadenopathy or thyromegaly Heart: Regular rate and rhythm, no murmurs Lungs: Clear throughout. Abdomen: Soft, nontender, no masses or organomegaly, prior surgical scar intact, positive bowel sounds x4 quads Extremities: No edema.    Assessment & Plan:   67.  68 year old female with a history of Crohn's disease with a fistula at the anastomosis site s/p  past right hemicolectomy 27+ yrs ago presents with chronic diarrhea refractory to Lomotil, patient declines oral mesalamine or other Crohn's treatment -Imodium 1 tab twice daily -Phillips bacteria probiotic 1 capsule once daily -CBC, CMP and CRP -Patient to call office if her loose stools persist or worsen -Follow-up in office in 3 to 4 months and as needed -Next colonoscopy due 06/2023 per Dr. Laural Golden

## 2019-02-17 NOTE — Patient Instructions (Signed)
1. Complete the provided lab order   2. Stop Lomotil  3. Take Imodium 1 tab by mouth twice daily  4. Take Phillip's bacteria probiotic once daily  5. Avoid dairy products   6. Follow up in the office in 3 to 4 months and as needed, call our office if your symptoms worsen

## 2019-02-18 ENCOUNTER — Other Ambulatory Visit: Payer: Medicare Other

## 2019-02-18 LAB — COMPLETE METABOLIC PANEL WITH GFR
AG Ratio: 1.7 (calc) (ref 1.0–2.5)
ALT: 10 U/L (ref 6–29)
AST: 21 U/L (ref 10–35)
Albumin: 4.3 g/dL (ref 3.6–5.1)
Alkaline phosphatase (APISO): 103 U/L (ref 37–153)
BUN/Creatinine Ratio: 14 (calc) (ref 6–22)
BUN: 18 mg/dL (ref 7–25)
CO2: 24 mmol/L (ref 20–32)
Calcium: 9.4 mg/dL (ref 8.6–10.4)
Chloride: 109 mmol/L (ref 98–110)
Creat: 1.27 mg/dL — ABNORMAL HIGH (ref 0.50–0.99)
GFR, Est African American: 51 mL/min/{1.73_m2} — ABNORMAL LOW (ref >=60)
GFR, Est Non African American: 44 mL/min/{1.73_m2} — ABNORMAL LOW (ref >=60)
Globulin: 2.6 g/dL (calc) (ref 1.9–3.7)
Glucose, Bld: 103 mg/dL (ref 65–139)
Potassium: 3.9 mmol/L (ref 3.5–5.3)
Sodium: 142 mmol/L (ref 135–146)
Total Bilirubin: 0.5 mg/dL (ref 0.2–1.2)
Total Protein: 6.9 g/dL (ref 6.1–8.1)

## 2019-02-18 LAB — CBC WITH DIFFERENTIAL/PLATELET
Absolute Monocytes: 285 cells/uL (ref 200–950)
Basophils Absolute: 18 cells/uL (ref 0–200)
Basophils Relative: 0.4 %
Eosinophils Absolute: 184 cells/uL (ref 15–500)
Eosinophils Relative: 4 %
HCT: 35.7 % (ref 35.0–45.0)
Hemoglobin: 11.9 g/dL (ref 11.7–15.5)
Lymphs Abs: 1343 cells/uL (ref 850–3900)
MCH: 31.6 pg (ref 27.0–33.0)
MCHC: 33.3 g/dL (ref 32.0–36.0)
MCV: 94.9 fL (ref 80.0–100.0)
MPV: 11.2 fL (ref 7.5–12.5)
Monocytes Relative: 6.2 %
Neutro Abs: 2769 cells/uL (ref 1500–7800)
Neutrophils Relative %: 60.2 %
Platelets: 136 10*3/uL — ABNORMAL LOW (ref 140–400)
RBC: 3.76 10*6/uL — ABNORMAL LOW (ref 3.80–5.10)
RDW: 12.5 % (ref 11.0–15.0)
Total Lymphocyte: 29.2 %
WBC: 4.6 10*3/uL (ref 3.8–10.8)

## 2019-02-18 LAB — C-REACTIVE PROTEIN: CRP: 0.7 mg/L (ref <8.0)

## 2019-03-12 DIAGNOSIS — H35362 Drusen (degenerative) of macula, left eye: Secondary | ICD-10-CM | POA: Diagnosis not present

## 2019-03-12 DIAGNOSIS — H524 Presbyopia: Secondary | ICD-10-CM | POA: Diagnosis not present

## 2019-03-16 DIAGNOSIS — Z96651 Presence of right artificial knee joint: Secondary | ICD-10-CM | POA: Diagnosis not present

## 2019-03-16 DIAGNOSIS — Z96653 Presence of artificial knee joint, bilateral: Secondary | ICD-10-CM | POA: Diagnosis not present

## 2019-03-16 DIAGNOSIS — Z471 Aftercare following joint replacement surgery: Secondary | ICD-10-CM | POA: Diagnosis not present

## 2019-03-17 ENCOUNTER — Other Ambulatory Visit: Payer: Self-pay | Admitting: Surgical

## 2019-03-17 DIAGNOSIS — Z79899 Other long term (current) drug therapy: Secondary | ICD-10-CM | POA: Diagnosis not present

## 2019-03-17 DIAGNOSIS — G2581 Restless legs syndrome: Secondary | ICD-10-CM | POA: Diagnosis not present

## 2019-03-17 DIAGNOSIS — M545 Low back pain: Secondary | ICD-10-CM | POA: Diagnosis not present

## 2019-03-17 DIAGNOSIS — Z96651 Presence of right artificial knee joint: Secondary | ICD-10-CM

## 2019-03-17 DIAGNOSIS — M542 Cervicalgia: Secondary | ICD-10-CM | POA: Diagnosis not present

## 2019-03-17 DIAGNOSIS — G5603 Carpal tunnel syndrome, bilateral upper limbs: Secondary | ICD-10-CM | POA: Diagnosis not present

## 2019-03-29 ENCOUNTER — Encounter (HOSPITAL_COMMUNITY)
Admission: RE | Admit: 2019-03-29 | Discharge: 2019-03-29 | Disposition: A | Discharge status: Home / Self Care | Payer: Medicare Other | Source: Ambulatory Visit | Attending: Surgical | Admitting: Surgical

## 2019-03-29 ENCOUNTER — Other Ambulatory Visit: Payer: Self-pay

## 2019-03-29 ENCOUNTER — Other Ambulatory Visit: Payer: Medicare Other

## 2019-03-29 DIAGNOSIS — Z96651 Presence of right artificial knee joint: Secondary | ICD-10-CM | POA: Insufficient documentation

## 2019-03-29 DIAGNOSIS — S82141D Displaced bicondylar fracture of right tibia, subsequent encounter for closed fracture with routine healing: Secondary | ICD-10-CM | POA: Diagnosis not present

## 2019-03-29 MED ORDER — TECHNETIUM TC 99M MEDRONATE IV KIT: 20.0000 | PACK | Freq: Once | INTRAVENOUS | Status: DC | PRN

## 2019-03-31 ENCOUNTER — Ambulatory Visit
Admission: RE | Admit: 2019-03-31 | Discharge: 2019-03-31 | Disposition: A | Discharge status: Home / Self Care | Payer: Medicare Other | Source: Ambulatory Visit | Attending: Otolaryngology | Admitting: Otolaryngology

## 2019-03-31 ENCOUNTER — Other Ambulatory Visit: Payer: Self-pay

## 2019-03-31 DIAGNOSIS — H905 Unspecified sensorineural hearing loss: Secondary | ICD-10-CM

## 2019-03-31 DIAGNOSIS — H9041 Sensorineural hearing loss, unilateral, right ear, with unrestricted hearing on the contralateral side: Secondary | ICD-10-CM | POA: Diagnosis not present

## 2019-03-31 MED ORDER — GADOBENATE DIMEGLUMINE 529 MG/ML IV SOLN
7.0000 mL | Freq: Once | INTRAVENOUS | Status: AC | PRN
  Administered 2019-03-31: 15:00:00 7 mL via INTRAVENOUS

## 2019-04-13 DIAGNOSIS — R5383 Other fatigue: Secondary | ICD-10-CM | POA: Diagnosis not present

## 2019-04-13 DIAGNOSIS — M549 Dorsalgia, unspecified: Secondary | ICD-10-CM | POA: Diagnosis not present

## 2019-04-13 DIAGNOSIS — N183 Chronic kidney disease, stage 3 unspecified: Secondary | ICD-10-CM | POA: Diagnosis not present

## 2019-04-13 DIAGNOSIS — I10 Essential (primary) hypertension: Secondary | ICD-10-CM | POA: Diagnosis not present

## 2019-04-13 DIAGNOSIS — Z6829 Body mass index (BMI) 29.0-29.9, adult: Secondary | ICD-10-CM | POA: Diagnosis not present

## 2019-04-13 DIAGNOSIS — Z299 Encounter for prophylactic measures, unspecified: Secondary | ICD-10-CM | POA: Diagnosis not present

## 2019-04-15 DIAGNOSIS — Z96651 Presence of right artificial knee joint: Secondary | ICD-10-CM | POA: Diagnosis not present

## 2019-04-15 DIAGNOSIS — Z471 Aftercare following joint replacement surgery: Secondary | ICD-10-CM | POA: Diagnosis not present

## 2019-04-16 DIAGNOSIS — Z96651 Presence of right artificial knee joint: Secondary | ICD-10-CM | POA: Diagnosis not present

## 2019-04-20 ENCOUNTER — Encounter: Payer: Self-pay | Admitting: Internal Medicine

## 2019-04-20 ENCOUNTER — Encounter (INDEPENDENT_AMBULATORY_CARE_PROVIDER_SITE_OTHER): Payer: Self-pay | Admitting: *Deleted

## 2019-04-20 DIAGNOSIS — I10 Essential (primary) hypertension: Secondary | ICD-10-CM | POA: Diagnosis not present

## 2019-04-20 DIAGNOSIS — Z6830 Body mass index (BMI) 30.0-30.9, adult: Secondary | ICD-10-CM | POA: Diagnosis not present

## 2019-04-20 DIAGNOSIS — E039 Hypothyroidism, unspecified: Secondary | ICD-10-CM | POA: Diagnosis not present

## 2019-04-20 DIAGNOSIS — R109 Unspecified abdominal pain: Secondary | ICD-10-CM | POA: Diagnosis not present

## 2019-04-20 DIAGNOSIS — Z299 Encounter for prophylactic measures, unspecified: Secondary | ICD-10-CM | POA: Diagnosis not present

## 2019-04-22 DIAGNOSIS — Z299 Encounter for prophylactic measures, unspecified: Secondary | ICD-10-CM | POA: Diagnosis not present

## 2019-04-22 DIAGNOSIS — Z6830 Body mass index (BMI) 30.0-30.9, adult: Secondary | ICD-10-CM | POA: Diagnosis not present

## 2019-04-22 DIAGNOSIS — R109 Unspecified abdominal pain: Secondary | ICD-10-CM | POA: Diagnosis not present

## 2019-04-22 DIAGNOSIS — I10 Essential (primary) hypertension: Secondary | ICD-10-CM | POA: Diagnosis not present

## 2019-04-22 DIAGNOSIS — K859 Acute pancreatitis without necrosis or infection, unspecified: Secondary | ICD-10-CM | POA: Diagnosis not present

## 2019-04-26 ENCOUNTER — Other Ambulatory Visit: Payer: Self-pay

## 2019-04-26 ENCOUNTER — Ambulatory Visit (INDEPENDENT_AMBULATORY_CARE_PROVIDER_SITE_OTHER): Payer: Medicare Other | Admitting: Internal Medicine

## 2019-04-26 ENCOUNTER — Encounter (INDEPENDENT_AMBULATORY_CARE_PROVIDER_SITE_OTHER): Payer: Self-pay | Admitting: *Deleted

## 2019-04-26 ENCOUNTER — Encounter (INDEPENDENT_AMBULATORY_CARE_PROVIDER_SITE_OTHER): Payer: Self-pay | Admitting: Internal Medicine

## 2019-04-26 VITALS — BP 133/93 | HR 91 | Temp 98.3°F | Ht 62.0 in | Wt 157.8 lb

## 2019-04-26 DIAGNOSIS — R197 Diarrhea, unspecified: Secondary | ICD-10-CM | POA: Insufficient documentation

## 2019-04-26 DIAGNOSIS — R1013 Epigastric pain: Secondary | ICD-10-CM | POA: Insufficient documentation

## 2019-04-26 DIAGNOSIS — K50013 Crohn's disease of small intestine with fistula: Secondary | ICD-10-CM

## 2019-04-26 HISTORY — DX: Epigastric pain: R10.13

## 2019-04-26 MED ORDER — CHOLESTYRAMINE 4 G PO PACK: 4.0000 g | PACK | Freq: Two times a day (BID) | ORAL | 2 refills | Status: DC

## 2019-04-26 NOTE — Patient Instructions (Addendum)
Remember to take cholestyramine/Questran 2 hours before or after taking other medications. Physician will call with results of CT scan when completed.

## 2019-04-26 NOTE — Progress Notes (Signed)
Presenting complaint;  Diarrhea in a patient with known history of Crohn's disease. Epigastric pain.  Database and subjective:  Patient is 68 year old Caucasian female who was over 35-year history of Crohn's disease with remote right hemicolectomy who was last colonoscopy was in December 2019 revealing focal inflammation a tiny hard at first suggestive of a fistula.  She has been on oral mesalamine in the past but not for the last 5 years or so.  She states this medication did not help. She was last in the office on 02/17/2019. She remains with diarrhea.  She has anywhere from 6-8 stools per day.  Stool volume ranges were small to large.  At times she can have 1 bowel movement and she starts with a formed stool and it becomes loose.  Most of her stools are semiformed to loose.  She has not had any accidents melena or rectal bleeding.  She was prescribed diphenoxylate for diarrhea but it has not helped.  She says she even took 3 doses at a time.  She also has tried Imodium as well as probiotic but it has not helped.  She says her appetite is good.  She has lost 5 pounds since her last visit.  She denies fever chills or vomiting.  She complains of epigastric pain which started about 3 months ago.  This pain radiates into her back and associated nausea and made worse with meals.  She was seen by Dr. [name] about 10 days ago.  She had blood work.  She says lipase was elevated and she is scheduled to have abdominal CT but has not heard yet. She takes gabapentin for peripheral neuropathy but not daily.  She also has a chronic low back pain for which she takes pain medication.  She is also requiring shots every 3 to 4 months which helps. She does not take OTC NSAIDs. Her last CT was in October 2016 revealing ileal wall thickening. She denies vomiting.  She also denies history of peptic ulcer disease.  She does not smoke cigarettes or drink alcohol. She has a brother with Crohn's disease.  He is 86 years old.   Her first cousin also had Crohn's disease and she died she was in her 19s.  Current Medications: Outpatient Encounter Medications as of 04/26/2019  Medication Sig  . ALLERGY RELIEF 180 MG tablet Take 180 mg by mouth daily.  . Ascorbic Acid (VITAMIN C) 1000 MG tablet Take 2,000 mg by mouth daily.  . Calcium Carbonate-Vit D-Min (CALTRATE 600+D PLUS PO) Take by mouth daily.  . EUTHYROX 75 MCG tablet Take 75 mcg by mouth daily.  . fluticasone (FLONASE) 50 MCG/ACT nasal spray Place 1 spray into the nose 2 (two) times daily.   Marland Kitchen gabapentin (NEURONTIN) 400 MG capsule TAKE 1 CAPSULE BY MOUTH 4 TIMES DAILY FOR 30 DAYS  . HYDROcodone-acetaminophen (NORCO/VICODIN) 5-325 MG tablet Take 1 tablet by mouth 2 (two) times daily as needed for moderate pain.  Marland Kitchen lisinopril (PRINIVIL,ZESTRIL) 20 MG tablet Take 20 mg by mouth every evening.   . Multiple Vitamin (MULTIVITAMIN WITH MINERALS) TABS tablet Take 1 tablet by mouth daily.  . pantoprazole (PROTONIX) 40 MG tablet Take 40 mg by mouth daily.  . sertraline (ZOLOFT) 100 MG tablet Take 1.5 tablets (150 mg total) by mouth daily.  . diphenoxylate-atropine (LOMOTIL) 2.5-0.025 MG tablet TAKE 1 TABLET BY MOUTH THREE TIMES DAILY AS NEEDED FOR DIARRHEA (Patient not taking: Reported on 04/26/2019)   No facility-administered encounter medications on file as of 04/26/2019.  Objective: Blood pressure (!) 133/93, pulse 91, temperature 98.3 F (36.8 C), temperature source Oral, height 5' 2"  (1.575 m), weight 157 lb 12.8 oz (71.6 kg). Patient is alert and in no acute distress. Conjunctiva is pink. Sclera is nonicteric Oropharyngeal mucosa is normal. No neck masses or thyromegaly noted. Cardiac exam with regular rhythm normal S1 and S2. No murmur or gallop noted. Lungs are clear to auscultation. Abdomen is symmetrical.  She has right subcostal scar.  Bowel sounds are normal.  On palpation she has mild to moderate midepigastric tenderness.  She also has mild  tenderness across lower abdomen.  No organomegaly or masses. No LE edema or clubbing noted.  Labs/studies Results: Lab data from 04/21/2019  WBC 3.9, H&H 11.2 and 32.0, platelet count 130 9K.  Differential unremarkable.  Glucose 83, BUN 10 and creatinine 0.99 Serum sodium 143, potassium 4.2, chloride 108, CO2 21 Serum calcium 9.5 Bilirubin 0.5, AP 129, AST 20, ALT 11, total protein 6.6 and albumin 4.3. Serum lipase was 106. Serum amylase 116  CRP on 02/25/2019 was 0.7(normal)  Assessment:  #1.  Crohn's disease.  She has history of small bowel Crohn's disease with remote right hemicolectomy.  Colonoscopy 10 months ago revealed focal inflammation and a tiny orifice suggestive of fistula.  Extent of this fistula has not been documented.  Clinically it was felt to be insignificant.  She presents with persistent diarrhea.  Diarrhea may well be due to chloretic effects of bile on the colon, small intestinal bacterial overgrowth or relapse of Crohn's disease.  She does not appear to be acutely ill.  Further evaluation is warranted before decision made to consider immunomodulator or biologic therapy.  #2.  Epigastric pain.  She has had this pain for 3 months associated with postprandial nausea.  Patient is on pantoprazole for GERD. reviewed recent lab studies pertinent for normal AST and ALT but mildly elevated serum amylase and lipase.  This abnormality raises the possibility of pancreatitis but may well be nonspecific.  Her risk factor for pancreatitis includes narcotic therapy.  She could also have peptic ulcer disease.  #3. mild thrombocytopenia.  No history of cirrhosis.   Plan:  Discontinue diphenoxylate as it is not working. Cholestyramine 4 g by mouth twice daily.  Patient advised to take this medication 2 hours after or before taking other medications in order to prevent interaction. Abdominopelvic CT with oral and IV contrast in near future. Office visit in 3 months.

## 2019-05-10 ENCOUNTER — Other Ambulatory Visit: Payer: Self-pay

## 2019-05-10 ENCOUNTER — Ambulatory Visit (HOSPITAL_COMMUNITY)
Admission: RE | Admit: 2019-05-10 | Discharge: 2019-05-10 | Disposition: A | Discharge status: Home / Self Care | Payer: Medicare Other | Source: Ambulatory Visit | Attending: Internal Medicine | Admitting: Internal Medicine

## 2019-05-10 DIAGNOSIS — K50013 Crohn's disease of small intestine with fistula: Secondary | ICD-10-CM | POA: Insufficient documentation

## 2019-05-10 DIAGNOSIS — N281 Cyst of kidney, acquired: Secondary | ICD-10-CM | POA: Diagnosis not present

## 2019-05-10 DIAGNOSIS — R109 Unspecified abdominal pain: Secondary | ICD-10-CM | POA: Diagnosis not present

## 2019-05-10 DIAGNOSIS — R1013 Epigastric pain: Secondary | ICD-10-CM | POA: Diagnosis not present

## 2019-05-10 LAB — POCT I-STAT CREATININE: Creatinine, Ser: 1.1 mg/dL — ABNORMAL HIGH (ref 0.44–1.00)

## 2019-05-10 MED ORDER — IOHEXOL 300 MG/ML  SOLN
100.0000 mL | Freq: Once | INTRAMUSCULAR | Status: AC | PRN
  Administered 2019-05-10: 100 mL via INTRAVENOUS

## 2019-05-12 ENCOUNTER — Encounter (INDEPENDENT_AMBULATORY_CARE_PROVIDER_SITE_OTHER): Payer: Self-pay

## 2019-05-13 ENCOUNTER — Encounter (INDEPENDENT_AMBULATORY_CARE_PROVIDER_SITE_OTHER): Payer: Self-pay

## 2019-05-17 ENCOUNTER — Telehealth (INDEPENDENT_AMBULATORY_CARE_PROVIDER_SITE_OTHER): Payer: Self-pay | Admitting: *Deleted

## 2019-05-17 ENCOUNTER — Ambulatory Visit (INDEPENDENT_AMBULATORY_CARE_PROVIDER_SITE_OTHER): Payer: Medicare Other | Admitting: Nurse Practitioner

## 2019-05-17 NOTE — Telephone Encounter (Signed)
Patient called and states that Dr.REhman had called and they discussed every thing but the fistula. She states that she is having  a major problem with this . She would appreciate a call back to discuss. [phone].

## 2019-05-17 NOTE — Telephone Encounter (Signed)
Patient's call returned. She now reports perianal opening with drainage. CT did not show any abnormality to suggest any fistulae. We will proceed with MR enterography of abdomen and pelvis.

## 2019-05-18 ENCOUNTER — Ambulatory Visit: Payer: Medicare Other | Admitting: Podiatry

## 2019-05-18 ENCOUNTER — Other Ambulatory Visit (INDEPENDENT_AMBULATORY_CARE_PROVIDER_SITE_OTHER): Payer: Self-pay | Admitting: *Deleted

## 2019-05-18 DIAGNOSIS — K5090 Crohn's disease, unspecified, without complications: Secondary | ICD-10-CM

## 2019-05-18 NOTE — Telephone Encounter (Signed)
MR enterography sch'd 05/25/19 at 10 (8), npo 4 hrs, patient aware

## 2019-05-20 ENCOUNTER — Ambulatory Visit: Payer: No Typology Code available for payment source | Admitting: Podiatry

## 2019-05-21 ENCOUNTER — Encounter: Payer: Self-pay | Admitting: Podiatry

## 2019-05-21 ENCOUNTER — Ambulatory Visit (INDEPENDENT_AMBULATORY_CARE_PROVIDER_SITE_OTHER): Payer: Medicare Other | Admitting: Podiatry

## 2019-05-21 ENCOUNTER — Other Ambulatory Visit: Payer: Self-pay

## 2019-05-21 DIAGNOSIS — M7752 Other enthesopathy of left foot: Secondary | ICD-10-CM | POA: Diagnosis not present

## 2019-05-21 DIAGNOSIS — M7751 Other enthesopathy of right foot: Secondary | ICD-10-CM

## 2019-05-25 ENCOUNTER — Ambulatory Visit (HOSPITAL_COMMUNITY)
Admission: RE | Admit: 2019-05-25 | Discharge: 2019-05-25 | Disposition: A | Discharge status: Home / Self Care | Payer: Medicare Other | Source: Ambulatory Visit | Attending: Internal Medicine | Admitting: Internal Medicine

## 2019-05-25 ENCOUNTER — Other Ambulatory Visit: Payer: Self-pay

## 2019-05-25 DIAGNOSIS — K5090 Crohn's disease, unspecified, without complications: Secondary | ICD-10-CM | POA: Insufficient documentation

## 2019-05-25 DIAGNOSIS — R1013 Epigastric pain: Secondary | ICD-10-CM | POA: Diagnosis not present

## 2019-05-25 MED ORDER — GADOBUTROL 1 MMOL/ML IV SOLN
6.0000 mL | Freq: Once | INTRAVENOUS | Status: AC | PRN
  Administered 2019-05-25: 6 mL via INTRAVENOUS

## 2019-05-25 MED ORDER — GLUCAGON HCL RDNA (DIAGNOSTIC) 1 MG IJ SOLR
INTRAMUSCULAR | Status: AC
  Administered 2019-05-25: 1 mg via INTRAVENOUS
  Filled 2019-05-25: qty 1

## 2019-05-25 MED ORDER — GLUCAGON HCL RDNA (DIAGNOSTIC) 1 MG IJ SOLR
1.0000 mg | Freq: Once | INTRAMUSCULAR | Status: AC
  Administered 2019-05-25: 10:00:00 1 mg via INTRAVENOUS

## 2019-05-25 NOTE — Progress Notes (Signed)
Subjective:   Patient ID: Jessica [name], female   DOB: 68 y.o.   MRN: [id number]   HPI Patient states she is developed a lot of pain in both ankles again and did well with previous injection and wants this repeated   ROS      Objective:  Physical Exam  Neurovascular status intact with sinus tarsitis inflammation of the sinus tarsi with pain bilateral     Assessment:  Acute capsulitis sinus tarsi bilateral     Plan:  Sterile prep and injected each sinus tarsi 3 mg Kenalog 5 mg Xylocaine bilateral and advised on reduced activity and reappoint to recheck

## 2019-05-27 DIAGNOSIS — D333 Benign neoplasm of cranial nerves: Secondary | ICD-10-CM | POA: Insufficient documentation

## 2019-05-27 DIAGNOSIS — H903 Sensorineural hearing loss, bilateral: Secondary | ICD-10-CM

## 2019-05-27 HISTORY — DX: Sensorineural hearing loss, bilateral: H90.3

## 2019-05-27 HISTORY — DX: Benign neoplasm of cranial nerves: D33.3

## 2019-06-10 DIAGNOSIS — Z882 Allergy status to sulfonamides status: Secondary | ICD-10-CM | POA: Diagnosis not present

## 2019-06-10 DIAGNOSIS — Z885 Allergy status to narcotic agent status: Secondary | ICD-10-CM | POA: Diagnosis not present

## 2019-06-10 DIAGNOSIS — R270 Ataxia, unspecified: Secondary | ICD-10-CM

## 2019-06-10 DIAGNOSIS — H90A21 Sensorineural hearing loss, unilateral, right ear, with restricted hearing on the contralateral side: Secondary | ICD-10-CM | POA: Diagnosis not present

## 2019-06-10 DIAGNOSIS — H9312 Tinnitus, left ear: Secondary | ICD-10-CM | POA: Diagnosis not present

## 2019-06-10 DIAGNOSIS — D333 Benign neoplasm of cranial nerves: Secondary | ICD-10-CM | POA: Diagnosis not present

## 2019-06-10 HISTORY — DX: Ataxia, unspecified: R27.0

## 2019-06-15 ENCOUNTER — Other Ambulatory Visit: Payer: Self-pay | Admitting: Otolaryngology

## 2019-06-15 DIAGNOSIS — D3610 Benign neoplasm of peripheral nerves and autonomic nervous system, unspecified: Secondary | ICD-10-CM

## 2019-06-21 ENCOUNTER — Ambulatory Visit (INDEPENDENT_AMBULATORY_CARE_PROVIDER_SITE_OTHER): Payer: Medicare Other | Admitting: Nurse Practitioner

## 2019-06-24 ENCOUNTER — Ambulatory Visit (INDEPENDENT_AMBULATORY_CARE_PROVIDER_SITE_OTHER): Payer: Medicare Other | Admitting: Nurse Practitioner

## 2019-07-05 ENCOUNTER — Ambulatory Visit
Admission: RE | Admit: 2019-07-05 | Discharge: 2019-07-05 | Disposition: A | Discharge status: Home / Self Care | Payer: Medicare Other | Source: Ambulatory Visit | Attending: Otolaryngology | Admitting: Otolaryngology

## 2019-07-05 DIAGNOSIS — D3610 Benign neoplasm of peripheral nerves and autonomic nervous system, unspecified: Secondary | ICD-10-CM

## 2019-07-05 MED ORDER — GADOBENATE DIMEGLUMINE 529 MG/ML IV SOLN
15.0000 mL | Freq: Once | INTRAVENOUS | Status: AC | PRN
  Administered 2019-07-05: 4 mL via INTRAVENOUS

## 2019-07-13 DIAGNOSIS — G603 Idiopathic progressive neuropathy: Secondary | ICD-10-CM | POA: Diagnosis not present

## 2019-07-13 DIAGNOSIS — Z79899 Other long term (current) drug therapy: Secondary | ICD-10-CM | POA: Diagnosis not present

## 2019-07-13 DIAGNOSIS — M5412 Radiculopathy, cervical region: Secondary | ICD-10-CM | POA: Diagnosis not present

## 2019-07-13 DIAGNOSIS — M545 Low back pain: Secondary | ICD-10-CM | POA: Diagnosis not present

## 2019-07-13 DIAGNOSIS — G2581 Restless legs syndrome: Secondary | ICD-10-CM | POA: Diagnosis not present

## 2019-07-20 ENCOUNTER — Other Ambulatory Visit: Payer: Self-pay

## 2019-07-20 ENCOUNTER — Ambulatory Visit (INDEPENDENT_AMBULATORY_CARE_PROVIDER_SITE_OTHER): Payer: Medicare Other | Admitting: Internal Medicine

## 2019-07-20 ENCOUNTER — Encounter (INDEPENDENT_AMBULATORY_CARE_PROVIDER_SITE_OTHER): Payer: Self-pay | Admitting: Internal Medicine

## 2019-07-20 VITALS — BP 117/78 | HR 76 | Temp 97.4°F | Ht 62.0 in | Wt 163.9 lb

## 2019-07-20 DIAGNOSIS — K5090 Crohn's disease, unspecified, without complications: Secondary | ICD-10-CM

## 2019-07-20 DIAGNOSIS — R197 Diarrhea, unspecified: Secondary | ICD-10-CM

## 2019-07-20 DIAGNOSIS — K219 Gastro-esophageal reflux disease without esophagitis: Secondary | ICD-10-CM | POA: Diagnosis not present

## 2019-07-20 MED ORDER — NYSTATIN-TRIAMCINOLONE 100000-0.1 UNIT/GM-% EX CREA: 1.0000 "application " | TOPICAL_CREAM | Freq: Two times a day (BID) | CUTANEOUS | 0 refills | Status: DC

## 2019-07-20 MED ORDER — PANTOPRAZOLE SODIUM 40 MG PO TBEC: 40.0000 mg | DELAYED_RELEASE_TABLET | Freq: Every day | ORAL | 1 refills | Status: DC

## 2019-07-20 NOTE — Patient Instructions (Signed)
Please talk with Dr. [name] about referral to kidney specialist. Use cream twice daily for one week thereafter as needed

## 2019-07-20 NOTE — Progress Notes (Signed)
Presenting complaint;  History of Crohn's disease.  Follow-up for diarrhea.  Database and subjective:  Patient is 69 year old Caucasian female who has history of small bowel Crohn's disease of and had right hemicolectomy possibly 35 years ago when she was living in state of [place].  Her last colonoscopy was in December 2019 revealing patent anastomosis with small ulcer and a tiny orifice at anastomosis raising a possibility of a fistula.  Rest of the colonic mucosa was normal.  She had internal hemorrhoids.  Since she was not having any symptoms she opted not to take any medications. She was seen in the office on 04/26/2019 for epigastric pain and diarrhea.  Her serum lipase was elevated at 106.  Transaminases were normal and alkaline phosphatase was borderline at 120.  She underwent abdominal pelvic CT on 05/10/2019 and there were no changes of pancreatitis or bowel wall thickening.  Because of tiny orifice at ileocolonic anastomosis and confirmed for fistula she also had MR enterography and no wall thickening or fistulous tract was identified.  Patient has been maintained on pantoprazole until few weeks ago when she ran out.  She has been taking cholestyramine for diarrhea with benefit.  She says she is doing well as per as the diarrhea is concerned.  On most days she takes single dose.  Occasionally she may take a second dose.  On most days she has 2 formed stools.  Once in a while she may have 5 to 6/day.  She may have an accident when she has spells of diarrhea.  She denies abdominal pain melena or rectal bleeding.  She complains of daily heartburn and burning pain in epigastrium since she ran out of pantoprazole.  She has a good appetite.  She has gained 6 pounds.  She she has been told that she has chronic kidney disease either stage I or III. she has not seen nephrologist up to this point. She is on pain medication for back pain and she usually takes 1 dose per day and she is on gabapentin for  peripheral neuropathy and rarely takes more than 2 doses per day. She also complains of perianal discomfort and pruritus.  Current Medications: Outpatient Encounter Medications as of 07/20/2019  Medication Sig  . ALLERGY RELIEF 180 MG tablet Take 180 mg by mouth daily.  . Ascorbic Acid (VITAMIN C) 1000 MG tablet Take 2,000 mg by mouth daily.  . Calcium Carbonate-Vit D-Min (CALTRATE 600+D PLUS PO) Take by mouth daily.  . cholestyramine (QUESTRAN) 4 g packet Take 1 packet (4 g total) by mouth 2 (two) times daily.  . EUTHYROX 75 MCG tablet Take 75 mcg by mouth daily.  . fluticasone (FLONASE) 50 MCG/ACT nasal spray Place 1 spray into the nose 2 (two) times daily.   Marland Kitchen gabapentin (NEURONTIN) 400 MG capsule TAKE 1 CAPSULE BY MOUTH 4 TIMES DAILY FOR 30 DAYS  . HYDROcodone-acetaminophen (NORCO/VICODIN) 5-325 MG tablet Take 1 tablet by mouth 2 (two) times daily as needed for moderate pain.  Marland Kitchen lisinopril (PRINIVIL,ZESTRIL) 20 MG tablet Take 20 mg by mouth every evening.   . Multiple Vitamin (MULTIVITAMIN WITH MINERALS) TABS tablet Take 1 tablet by mouth daily.  . sertraline (ZOLOFT) 100 MG tablet Take 1.5 tablets (150 mg total) by mouth daily.  . pantoprazole (PROTONIX) 40 MG tablet Take 40 mg by mouth daily.   No facility-administered encounter medications on file as of 07/20/2019.     Objective: Blood pressure 117/78, pulse 76, temperature (!) 97.4 F (36.3 C), temperature source  Temporal, height 5' 2"  (1.575 m), weight 163 lb 14.4 oz (74.3 kg). Patient is alert and in no acute distress. She has hearing impairment. She is wearing facial mask. Conjunctiva is pink. Sclera is nonicteric Oropharyngeal mucosa is normal. She has partial upper plate and her own teeth and lower jaw.  Some are missing. No neck masses or thyromegaly noted. Cardiac exam with regular rhythm normal S1 and S2. No murmur or gallop noted. Lungs are clear to auscultation. Abdomen is full.  She has long right subcostal and  midline scars.  Bowel sounds are normal.  On palpation abdomen is soft with mild midepigastric tenderness.  No organomegaly or masses. Rectal examination was limited to external inspection.  She has sentinel skin tag anteriorly but there is no perianal opening or a fistula. No LE edema or clubbing noted.  Labs/studies Results:  CBC Latest Ref Rng & Units 02/17/2019 08/28/2017 08/27/2017  WBC 3.8 - 10.8 Thousand/uL 4.6 6.0 7.5  Hemoglobin 11.7 - 15.5 g/dL 11.9 8.9(L) 9.3(L)  Hematocrit 35.0 - 45.0 % 35.7 26.2(L) 26.9(L)  Platelets 140 - 400 Thousand/uL 136(L) 147(L) 148(L)    CMP Latest Ref Rng & Units 05/10/2019 02/17/2019 08/27/2017  Glucose 65 - 139 mg/dL - 103 92  BUN 7 - 25 mg/dL - 18 17  Creatinine 0.44 - 1.00 mg/dL 1.10(H) 1.27(H) 1.03(H)  Sodium 135 - 146 mmol/L - 142 139  Potassium 3.5 - 5.3 mmol/L - 3.9 4.5  Chloride 98 - 110 mmol/L - 109 109  CO2 20 - 32 mmol/L - 24 23  Calcium 8.6 - 10.4 mg/dL - 9.4 8.7(L)  Total Protein 6.1 - 8.1 g/dL - 6.9 -  Total Bilirubin 0.2 - 1.2 mg/dL - 0.5 -  Alkaline Phos 38 - 126 U/L - - -  AST 10 - 35 U/L - 21 -  ALT 6 - 29 U/L - 10 -    Hepatic Function Latest Ref Rng & Units 02/17/2019 08/20/2017 09/03/2016  Total Protein 6.1 - 8.1 g/dL 6.9 7.8 7.0  Albumin 3.5 - 5.0 g/dL - 4.3 4.0  AST 10 - 35 U/L 21 26 23   ALT 6 - 29 U/L 10 12(L) 11(L)  Alk Phosphatase 38 - 126 U/L - 122 112  Total Bilirubin 0.2 - 1.2 mg/dL 0.5 0.8 0.7    Lab Results  Component Value Date   CRP 0.7 02/17/2019    Colonoscopy pictures along with CT and MR images reviewed with patient.  Assessment:  #1.  History of small bowel Crohn's disease.  She is presently taking no medication and appears to be in remission.  Diarrhea would appear to be due to right hemicolectomy and irritant effect of bile on colon controlled with cholestyramine.  Colonoscopy in December 2019 revealed tiny orifice at anastomosis but no fistulous tract identified on CT or MR performed within the last few  months.  #2.  Chronic GERD.  She is having daily heartburn since she ran out of pantoprazole.  Will resume pantoprazole at 40 mg daily.  #3.  Chronic kidney disease.  Patient would benefit from nephrology consultation.  Patient will discuss with Dr. [name].  #4.  Perianal irritation possibly due to anal tag.   Plan:  Pantoprazole 40 mg by mouth 30 minutes before breakfast daily.  Prescription sent to patient's pharmacy for 90 doses with 1 refill. Mycolog-II cream to be applied to periarea twice daily for 1 week and thereafter on as-needed basis. Office visit in 6 months.

## 2019-07-22 DIAGNOSIS — H9042 Sensorineural hearing loss, unilateral, left ear, with unrestricted hearing on the contralateral side: Secondary | ICD-10-CM | POA: Diagnosis not present

## 2019-07-22 DIAGNOSIS — Z461 Encounter for fitting and adjustment of hearing aid: Secondary | ICD-10-CM | POA: Diagnosis not present

## 2019-08-19 DIAGNOSIS — Z789 Other specified health status: Secondary | ICD-10-CM | POA: Diagnosis not present

## 2019-08-19 DIAGNOSIS — Z299 Encounter for prophylactic measures, unspecified: Secondary | ICD-10-CM | POA: Diagnosis not present

## 2019-08-19 DIAGNOSIS — R5383 Other fatigue: Secondary | ICD-10-CM | POA: Diagnosis not present

## 2019-08-19 DIAGNOSIS — I10 Essential (primary) hypertension: Secondary | ICD-10-CM | POA: Diagnosis not present

## 2019-08-19 DIAGNOSIS — N183 Chronic kidney disease, stage 3 unspecified: Secondary | ICD-10-CM | POA: Diagnosis not present

## 2019-08-19 DIAGNOSIS — Z6831 Body mass index (BMI) 31.0-31.9, adult: Secondary | ICD-10-CM | POA: Diagnosis not present

## 2019-08-19 DIAGNOSIS — K5010 Crohn's disease of large intestine without complications: Secondary | ICD-10-CM | POA: Diagnosis not present

## 2019-08-25 DIAGNOSIS — Z23 Encounter for immunization: Secondary | ICD-10-CM | POA: Diagnosis not present

## 2019-09-22 DIAGNOSIS — Z23 Encounter for immunization: Secondary | ICD-10-CM | POA: Diagnosis not present

## 2019-10-12 ENCOUNTER — Other Ambulatory Visit: Payer: Self-pay

## 2019-10-12 ENCOUNTER — Encounter: Payer: Self-pay | Admitting: Podiatry

## 2019-10-12 ENCOUNTER — Ambulatory Visit (INDEPENDENT_AMBULATORY_CARE_PROVIDER_SITE_OTHER): Payer: Medicare Other | Admitting: Podiatry

## 2019-10-12 DIAGNOSIS — M7752 Other enthesopathy of left foot: Secondary | ICD-10-CM | POA: Diagnosis not present

## 2019-10-12 DIAGNOSIS — M778 Other enthesopathies, not elsewhere classified: Secondary | ICD-10-CM

## 2019-10-12 DIAGNOSIS — L603 Nail dystrophy: Secondary | ICD-10-CM

## 2019-10-12 DIAGNOSIS — M7751 Other enthesopathy of right foot: Secondary | ICD-10-CM

## 2019-10-12 DIAGNOSIS — M722 Plantar fascial fibromatosis: Secondary | ICD-10-CM

## 2019-10-12 NOTE — Progress Notes (Signed)
She presents today for follow-up of her bilateral sinus tarsitis states that I am hurting a little bit here and a little bit there could I please get some injections today to help alleviate my symptoms.  She states that her plantar fasciitis is really killing her she is tried different shoes to help but to no avail.  She is complaining of pain around the sinus tarsi again as well and pain in the forefoot between the first and second toes.  Objective: I have reviewed her past medical history medications allergies surgery social history and review of systems.  Pulses are palpable.  Neurologic sensorium is intact degenerative flexors are intact muscle strength is normal symmetrical bilateral.  No open lesions or wounds are noted.  She does have pain on palpation medial calcaneal tubercle bilaterally right worse than the left she also has pain on palpation of the subtalar joint sinus tarsi and pain on end range of motion of the subtalar joint.  She also has pain on palpation of the first intermetatarsal space distally possible deep peroneal nerve neuritis or just capsulitis of the sesamoids or second metatarsophalangeal joint.  Assessment: Plantar fasciitis bilateral sinus tarsitis bilateral capsulitis of the forefoot bilateral.  Plan: Gust etiology pathology and surgical therapies after Betadine skin prep to each area I injected 2 mg of dexamethasone and local anesthetic to the first intermetatarsal space bilaterally near the metatarsophalangeal joints.  I also injected 10 mg of Kenalog to the sinus tarsi bilaterally 10 mg of Kenalog to the plantar fascial calcaneal insertion sites bilaterally.  Patient tolerated this procedure well will follow up with me in the near future for further discussion and treatment.

## 2019-11-01 ENCOUNTER — Telehealth (INDEPENDENT_AMBULATORY_CARE_PROVIDER_SITE_OTHER): Payer: Self-pay | Admitting: Internal Medicine

## 2019-11-01 NOTE — Telephone Encounter (Signed)
Patient called stated she is trying to get a referral to a Kidney Specialist - wanted to know if Dr Laural Golden could refer her - stated Dr [name] didn't think she needed to see one - patient was advised to see PCP - during last office visit Dr Laural Golden stated Chronic kidney disease.  Patient would benefit from nephrology consultation.  Patient will discuss with Dr. [name].

## 2019-11-03 DIAGNOSIS — Z6831 Body mass index (BMI) 31.0-31.9, adult: Secondary | ICD-10-CM | POA: Diagnosis not present

## 2019-11-03 DIAGNOSIS — I10 Essential (primary) hypertension: Secondary | ICD-10-CM | POA: Diagnosis not present

## 2019-11-03 DIAGNOSIS — R32 Unspecified urinary incontinence: Secondary | ICD-10-CM | POA: Diagnosis not present

## 2019-11-03 DIAGNOSIS — Z299 Encounter for prophylactic measures, unspecified: Secondary | ICD-10-CM | POA: Diagnosis not present

## 2019-11-03 DIAGNOSIS — R350 Frequency of micturition: Secondary | ICD-10-CM | POA: Diagnosis not present

## 2019-11-17 DIAGNOSIS — Z7189 Other specified counseling: Secondary | ICD-10-CM | POA: Diagnosis not present

## 2019-11-17 DIAGNOSIS — Z79899 Other long term (current) drug therapy: Secondary | ICD-10-CM | POA: Diagnosis not present

## 2019-11-17 DIAGNOSIS — Z0000 Encounter for general adult medical examination without abnormal findings: Secondary | ICD-10-CM | POA: Diagnosis not present

## 2019-11-17 DIAGNOSIS — Z299 Encounter for prophylactic measures, unspecified: Secondary | ICD-10-CM | POA: Diagnosis not present

## 2019-11-17 DIAGNOSIS — M8580 Other specified disorders of bone density and structure, unspecified site: Secondary | ICD-10-CM | POA: Diagnosis not present

## 2019-11-17 DIAGNOSIS — Z1339 Encounter for screening examination for other mental health and behavioral disorders: Secondary | ICD-10-CM | POA: Diagnosis not present

## 2019-11-17 DIAGNOSIS — I10 Essential (primary) hypertension: Secondary | ICD-10-CM | POA: Diagnosis not present

## 2019-11-17 DIAGNOSIS — E039 Hypothyroidism, unspecified: Secondary | ICD-10-CM | POA: Diagnosis not present

## 2019-11-17 DIAGNOSIS — E7800 Pure hypercholesterolemia, unspecified: Secondary | ICD-10-CM | POA: Diagnosis not present

## 2019-11-17 DIAGNOSIS — Z6831 Body mass index (BMI) 31.0-31.9, adult: Secondary | ICD-10-CM | POA: Diagnosis not present

## 2019-11-17 DIAGNOSIS — R5383 Other fatigue: Secondary | ICD-10-CM | POA: Diagnosis not present

## 2019-11-17 DIAGNOSIS — Z1211 Encounter for screening for malignant neoplasm of colon: Secondary | ICD-10-CM | POA: Diagnosis not present

## 2019-11-17 DIAGNOSIS — Z1331 Encounter for screening for depression: Secondary | ICD-10-CM | POA: Diagnosis not present

## 2019-11-18 DIAGNOSIS — Z79899 Other long term (current) drug therapy: Secondary | ICD-10-CM | POA: Diagnosis not present

## 2019-11-18 DIAGNOSIS — G2581 Restless legs syndrome: Secondary | ICD-10-CM | POA: Diagnosis not present

## 2019-11-18 DIAGNOSIS — M542 Cervicalgia: Secondary | ICD-10-CM | POA: Diagnosis not present

## 2019-11-18 DIAGNOSIS — M545 Low back pain: Secondary | ICD-10-CM | POA: Diagnosis not present

## 2019-11-24 DIAGNOSIS — D225 Melanocytic nevi of trunk: Secondary | ICD-10-CM | POA: Diagnosis not present

## 2019-11-24 DIAGNOSIS — L7211 Pilar cyst: Secondary | ICD-10-CM | POA: Diagnosis not present

## 2019-11-24 DIAGNOSIS — L821 Other seborrheic keratosis: Secondary | ICD-10-CM | POA: Diagnosis not present

## 2019-11-24 DIAGNOSIS — D220 Melanocytic nevi of lip: Secondary | ICD-10-CM | POA: Diagnosis not present

## 2019-11-24 DIAGNOSIS — D485 Neoplasm of uncertain behavior of skin: Secondary | ICD-10-CM | POA: Diagnosis not present

## 2019-11-25 DIAGNOSIS — E785 Hyperlipidemia, unspecified: Secondary | ICD-10-CM | POA: Diagnosis not present

## 2019-11-25 DIAGNOSIS — E559 Vitamin D deficiency, unspecified: Secondary | ICD-10-CM | POA: Diagnosis not present

## 2019-11-25 DIAGNOSIS — Z299 Encounter for prophylactic measures, unspecified: Secondary | ICD-10-CM | POA: Diagnosis not present

## 2019-11-25 DIAGNOSIS — I10 Essential (primary) hypertension: Secondary | ICD-10-CM | POA: Diagnosis not present

## 2019-11-25 DIAGNOSIS — N183 Chronic kidney disease, stage 3 unspecified: Secondary | ICD-10-CM | POA: Diagnosis not present

## 2019-12-01 ENCOUNTER — Encounter (HOSPITAL_COMMUNITY): Payer: Self-pay | Admitting: Psychiatry

## 2019-12-01 ENCOUNTER — Telehealth (INDEPENDENT_AMBULATORY_CARE_PROVIDER_SITE_OTHER): Payer: Medicare Other | Admitting: Psychiatry

## 2019-12-01 ENCOUNTER — Other Ambulatory Visit: Payer: Self-pay

## 2019-12-01 DIAGNOSIS — F331 Major depressive disorder, recurrent, moderate: Secondary | ICD-10-CM

## 2019-12-01 MED ORDER — DIAZEPAM 5 MG PO TABS: 5.0000 mg | ORAL_TABLET | Freq: Every day | ORAL | 2 refills | Status: DC

## 2019-12-01 MED ORDER — SERTRALINE HCL 100 MG PO TABS: 150.0000 mg | ORAL_TABLET | Freq: Every day | ORAL | 3 refills | Status: DC

## 2019-12-01 NOTE — Progress Notes (Signed)
Virtual Visit via Video Note  I connected with Jessica [name] on 12/01/19 at  9:30 AM EDT by a video enabled telemedicine application and verified that I am speaking with the correct person using two identifiers.   I discussed the limitations of evaluation and management by telemedicine and the availability of in person appointments. The patient expressed understanding and agreed to proceed.    I discussed the assessment and treatment plan with the patient. The patient was provided an opportunity to ask questions and all were answered. The patient agreed with the plan and demonstrated an understanding of the instructions.   The patient was advised to call back or seek an in-person evaluation if the symptoms worsen or if the condition fails to improve as anticipated.  I provided 15 minutes of non-face-to-face time during this encounter.   Jessica Spiller, MD  [facility] MD/PA/NP OP Progress Note  12/01/2019 9:58 AM Jessica [name]  MRN:  [id number]  Chief Complaint:  Chief Complaint    Anxiety; Depression; Follow-up     HPI: The patient returns for follow-up after a long absence.  She was last seen 18 months ago.  She is a 69 year old married white female who lives with her husband in [place].  She states that her mother in law moved in several months ago.  She has 2 grown sons.  She retired as a Magazine features editor about 5 years ago.  The patient reports that she has not been doing as well lately and decided to come back in.  She has been having a lot of medical issues.  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  She has tried hearing aids which only worked to some degree.  Consequently she states people are always frustrated with her because she has some to repeat things and then she ends up getting angry as well.  This is a particular issue with her husband.  She also states that her mother-in-law has moved in and they really do not get  along.  She also has low vitamin D and she is just now gotten supplementation.  She is not sleeping well at night and has a lot of anxiety irritability and anger issues particularly with her family.  She denies thoughts of self-harm or suicide.  Finally her creatinine is slightly high and she is going to have to see a nephrologist.  The patient states that every day she takes Zoloft 100 mg but sometimes will take 150.  I explained that the medication does not work that way and he have to take the higher dose consistently for what to work.  She is no longer on Valium and I think we need to reinstate this because it is to help with her sleep and anxiety and she agrees. Visit Diagnosis:    ICD-10-CM   1. Major depressive disorder, recurrent episode, moderate (HCC)  F33.1 sertraline (ZOLOFT) 100 MG tablet    Past Psychiatric History: Former patient in this office  Past Medical History:  Past Medical History:  Diagnosis Date  . Anxiety attack   . Arthritis   . Back pain, chronic   . Crohn's colitis ([place])   . Depression (emotion)   . Dysrhythmia   . GERD (gastroesophageal reflux disease)   . Hypertension   . Hypothyroid   . Neuropathy   . Sleep apnea    cpap is not working per patient - needs to find a new  doctor - not used since 7/16 per pat   . Vertigo    followed by Dr Wardell Heath- in [place]     Past Surgical History:  Procedure Laterality Date  . ABDOMINAL HYSTERECTOMY    . BIOPSY  06/08/2018   Procedure: BIOPSY;  Surgeon: Rogene Houston, MD;  Location: [facility];  Service: Endoscopy;;  (colon)  . CHOLECYSTECTOMY    . COLON SURGERY     for Crohn's in the 66s in [place]  . COLONOSCOPY N/A 03/11/2013   Procedure: COLONOSCOPY;  Surgeon: Rogene Houston, MD;  Location: [facility];  Service: Endoscopy;  Laterality: N/A;  1200  . COLONOSCOPY N/A 06/08/2018   Procedure: COLONOSCOPY;  Surgeon: Rogene Houston, MD;  Location: [facility];  Service: Endoscopy;  Laterality: N/A;  8:25  .  fatty tumor removed    . HARDWARE REMOVAL Right 08/07/2016   Procedure: HARDWARE REMOVAL;  Surgeon: Gaynelle Arabian, MD;  Location: [facility];  Service: Orthopedics;  Laterality: Right;  . Hip replacement      Rt hip in 2010 in [place]  . KNEE ARTHROSCOPY Left 03/29/2015   Procedure: ARTHROSCOPY LEFT KNEE WITH MENSICAL DEBRIDEMENT, chondroplasty;  Surgeon: Gaynelle Arabian, MD;  Location: [facility];  Service: Orthopedics;  Laterality: Left;  . ORIF TIBIA PLATEAU Right 02/29/2016   Procedure: OPEN REDUCTION INTERNAL FIXATION (ORIF RIGHT  TIBIAL PLATEAU FRACTURE;  Surgeon: Gaynelle Arabian, MD;  Location: [facility];  Service: Orthopedics;  Laterality: Right;  . TOTAL KNEE ARTHROPLASTY Right 08/25/2017   Procedure: TOTAL RIGHT KNEE ARTHROPLASTY;  Surgeon: Gaynelle Arabian, MD;  Location: [facility];  Service: Orthopedics;  Laterality: Right;    Family Psychiatric History: see below  Family History:  Family History  Problem Relation Age of Onset  . Crohn's disease Brother   . Depression Mother   . Anxiety disorder Mother   . Depression Sister   . Aneurysm Father   . Colon cancer Neg Hx     Social History:  Social History   Socioeconomic History  . Marital status: Married    Spouse name: Not on file  . Number of children: Not on file  . Years of education: Not on file  . Highest education level: Not on file  Occupational History  . Not on file  Tobacco Use  . Smoking status: Never Smoker  . Smokeless tobacco: Never Used  Substance and Sexual Activity  . Alcohol use: No    Alcohol/week: 0.0 standard drinks  . Drug use: No  . Sexual activity: Not Currently  Other Topics Concern  . Not on file  Social History Narrative  . Not on file   Social Determinants of Health   Financial Resource Strain:   . Difficulty of Paying Living Expenses:   Food Insecurity:   . Worried About Charity fundraiser in the Last Year:   . Arboriculturist in the Last Year:   Transportation Needs:   . Lexicographer (Medical):   Marland Kitchen Lack of Transportation (Non-Medical):   Physical Activity:   . Days of Exercise per Week:   . Minutes of Exercise per Session:   Stress:   . Feeling of Stress :   Social Connections:   . Frequency of Communication with Friends and Family:   . Frequency of Social Gatherings with Friends and Family:   . Attends Religious Services:   . Active Member of Clubs or Organizations:   . Attends Archivist Meetings:   .  Marital Status:     Allergies:  Allergies  Allergen Reactions  . Morphine And Related Hives and Itching    May cause blood pressure to drop  . Robaxin [Methocarbamol] Itching    (PT WILL RESUME TAKING TO SEE IF THIS IS HER ALLERGY)  . Sulfa Antibiotics Itching    ALL SULFA DRUGS    Metabolic Disorder Labs: No results found for: HGBA1C, MPG No results found for: PROLACTIN No results found for: CHOL, TRIG, HDL, CHOLHDL, VLDL, LDLCALC No results found for: TSH  Therapeutic Level Labs: No results found for: LITHIUM No results found for: VALPROATE No components found for:  CBMZ  Current Medications: Current Outpatient Medications  Medication Sig Dispense Refill  . ALLERGY RELIEF 180 MG tablet Take 180 mg by mouth daily.    . Ascorbic Acid (VITAMIN C) 1000 MG tablet Take 2,000 mg by mouth daily.    . Calcium Carbonate-Vit D-Min (CALTRATE 600+D PLUS PO) Take by mouth daily.    . cholestyramine (QUESTRAN) 4 g packet Take 1 packet (4 g total) by mouth 2 (two) times daily. 60 each 2  . diazepam (VALIUM) 5 MG tablet Take 1 tablet (5 mg total) by mouth at bedtime. 30 tablet 2  . EUTHYROX 75 MCG tablet Take 75 mcg by mouth daily.    . fluticasone (FLONASE) 50 MCG/ACT nasal spray Place 1 spray into the nose 2 (two) times daily.     Marland Kitchen gabapentin (NEURONTIN) 400 MG capsule TAKE 1 CAPSULE BY MOUTH 4 TIMES DAILY FOR 30 DAYS    . HYDROcodone-acetaminophen (NORCO/VICODIN) 5-325 MG tablet Take 1 tablet by mouth 2 (two) times daily as needed for  moderate pain.    Marland Kitchen lisinopril (PRINIVIL,ZESTRIL) 20 MG tablet Take 20 mg by mouth every evening.     . Multiple Vitamin (MULTIVITAMIN WITH MINERALS) TABS tablet Take 1 tablet by mouth daily.    Marland Kitchen nystatin-triamcinolone (MYCOLOG II) cream Apply 1 application topically 2 (two) times daily. 30 g 0  . pantoprazole (PROTONIX) 40 MG tablet Take 1 tablet (40 mg total) by mouth daily before breakfast. 90 tablet 1  . sertraline (ZOLOFT) 100 MG tablet Take 1.5 tablets (150 mg total) by mouth daily. 135 tablet 3  . Vitamin D, Ergocalciferol, (DRISDOL) 1.25 MG (50000 UNIT) CAPS capsule TAKE 1 CAPSULE BY MOUTH TWICE A WEEK FOR 4 MONTHS THEN REDUCE TO ONCE WEEKLY     No current facility-administered medications for this visit.     Musculoskeletal: Strength & Muscle Tone: within normal limits Gait & Station: normal Patient leans: N/A  Psychiatric Specialty Exam: Review of Systems  HENT: Positive for hearing loss.   Psychiatric/Behavioral: Positive for dysphoric mood and sleep disturbance. The patient is nervous/anxious.   All other systems reviewed and are negative.   There were no vitals taken for this visit.There is no height or weight on file to calculate BMI.  General Appearance: Casual, Neat and Well Groomed  Eye Contact:  Good  Speech:  Clear and Coherent  Volume:  Normal  Mood:  Dysphoric and Irritable  Affect:  Appropriate and Congruent  Thought Process:  Goal Directed  Orientation:  Full (Time, Place, and Person)  Thought Content: Rumination   Suicidal Thoughts:  No  Homicidal Thoughts:  No  Memory:  Immediate;   Good Recent;   Good Remote;   Fair  Judgement:  Good  Insight:  Fair  Psychomotor Activity:  Normal  Concentration:  Concentration: Good and Attention Span: Good  Recall:  Good  Fund of Knowledge: Good  Language: Good  Akathisia:  No  Handed:  Right  AIMS (if indicated): not done  Assets:  Communication Skills Desire for Improvement Resilience Social  Support Talents/Skills  ADL's:  Intact  Cognition: WNL  Sleep:  Poor   Screenings:   Assessment and Plan: This patient is a 69 year old female with a history of depression and anxiety who represents after a long absence.  She is now having more anger and irritability related to frustrations with her hearing loss and family members.  She is not sleeping well.  I have told her that she needs to take the Zoloft more consistently but will continue at a dosage of 150 mg daily for depression and anxiety.  We will add back Valium 5 mg at bedtime for sleep and agitation.  She will return to see me in 4 weeks   Jessica Spiller, MD 12/01/2019, 9:58 AM

## 2019-12-03 DIAGNOSIS — Z79899 Other long term (current) drug therapy: Secondary | ICD-10-CM | POA: Diagnosis not present

## 2019-12-03 DIAGNOSIS — E2839 Other primary ovarian failure: Secondary | ICD-10-CM | POA: Diagnosis not present

## 2019-12-03 DIAGNOSIS — M859 Disorder of bone density and structure, unspecified: Secondary | ICD-10-CM | POA: Diagnosis not present

## 2019-12-09 DIAGNOSIS — H832X1 Labyrinthine dysfunction, right ear: Secondary | ICD-10-CM | POA: Diagnosis not present

## 2019-12-09 DIAGNOSIS — D333 Benign neoplasm of cranial nerves: Secondary | ICD-10-CM | POA: Diagnosis not present

## 2019-12-09 DIAGNOSIS — H9311 Tinnitus, right ear: Secondary | ICD-10-CM | POA: Diagnosis not present

## 2019-12-09 DIAGNOSIS — H903 Sensorineural hearing loss, bilateral: Secondary | ICD-10-CM | POA: Diagnosis not present

## 2019-12-09 DIAGNOSIS — R270 Ataxia, unspecified: Secondary | ICD-10-CM | POA: Diagnosis not present

## 2019-12-14 DIAGNOSIS — N183 Chronic kidney disease, stage 3 unspecified: Secondary | ICD-10-CM | POA: Diagnosis not present

## 2019-12-14 DIAGNOSIS — E039 Hypothyroidism, unspecified: Secondary | ICD-10-CM | POA: Diagnosis not present

## 2019-12-14 DIAGNOSIS — M810 Age-related osteoporosis without current pathological fracture: Secondary | ICD-10-CM | POA: Diagnosis not present

## 2019-12-14 DIAGNOSIS — Z299 Encounter for prophylactic measures, unspecified: Secondary | ICD-10-CM | POA: Diagnosis not present

## 2019-12-14 DIAGNOSIS — I10 Essential (primary) hypertension: Secondary | ICD-10-CM | POA: Diagnosis not present

## 2020-01-18 ENCOUNTER — Encounter (INDEPENDENT_AMBULATORY_CARE_PROVIDER_SITE_OTHER): Payer: Self-pay | Admitting: Internal Medicine

## 2020-01-18 ENCOUNTER — Other Ambulatory Visit: Payer: Self-pay

## 2020-01-18 ENCOUNTER — Ambulatory Visit (INDEPENDENT_AMBULATORY_CARE_PROVIDER_SITE_OTHER): Payer: Medicare Other | Admitting: Internal Medicine

## 2020-01-18 VITALS — BP 147/75 | HR 81 | Temp 98.6°F | Ht 62.0 in | Wt 165.0 lb

## 2020-01-18 DIAGNOSIS — K219 Gastro-esophageal reflux disease without esophagitis: Secondary | ICD-10-CM | POA: Diagnosis not present

## 2020-01-18 DIAGNOSIS — K5010 Crohn's disease of large intestine without complications: Secondary | ICD-10-CM

## 2020-01-18 DIAGNOSIS — Z96652 Presence of left artificial knee joint: Secondary | ICD-10-CM

## 2020-01-18 DIAGNOSIS — K649 Unspecified hemorrhoids: Secondary | ICD-10-CM | POA: Diagnosis not present

## 2020-01-18 MED ORDER — NYSTATIN-TRIAMCINOLONE 100000-0.1 UNIT/GM-% EX CREA: 1.0000 "application " | TOPICAL_CREAM | Freq: Two times a day (BID) | CUTANEOUS | 1 refills | Status: DC

## 2020-01-18 MED ORDER — LOPERAMIDE HCL 2 MG PO CAPS: 2.0000 mg | ORAL_CAPSULE | Freq: Two times a day (BID) | ORAL | 5 refills | Status: DC

## 2020-01-18 NOTE — Progress Notes (Signed)
Presenting complaint;  Follow-up for diarrhea/Crohn's disease and history of GERD. Complains of flareup with hemorrhoids.  Database and subjective:  Patient is 69 year old Caucasian female who has history of small bowel Crohn's disease and underwent right hemicolectomy for 36 years ago while she was living in [place].  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  She had internal hemorrhoids.  Rest of the colonic mucosa was normal.  She opted not to take mesalamine or other therapies.  She has had chronic diarrhea. She was last seen in the office 6 months ago. Today she is accompanied by her her mother-in-law who is [place] to help with visit as patient has hearing impairment. Patient says she is doing well.  Pantoprazole is working.  She rarely has heartburn.  She denies nausea vomiting dysphagia or throat symptoms.  She wonders if she can decrease PPI dose. She says diarrhea is controlled with Questran which she takes once a day.  With Questran she has 2 bowel movements.  However without Questran she has 4-6 stools and most of them are loose.  Patient states is difficult to time Questran dose because she is on so many medications.  She denies abdominal pain.  He also denies melena or rectal bleeding.  Her appetite is good and her weight has been stable.  She has back pain and takes no more than half to 1 pain pill per week.  Current Medications: Outpatient Encounter Medications as of 01/18/2020  Medication Sig  . alendronate (FOSAMAX) 70 MG tablet Take 70 mg by mouth once a week. Take with a full glass of water on an empty stomach.  . Ascorbic Acid (VITAMIN C) 1000 MG tablet Take 2,000 mg by mouth daily.  . cholestyramine (QUESTRAN) 4 g packet Take 1 packet (4 g total) by mouth 2 (two) times daily.  . diphenhydrAMINE-PE-APAP 25-5-325 MG TABS Take by mouth in the morning and at bedtime.   . fluticasone (FLONASE) 50 MCG/ACT nasal spray Place 1 spray into the nose 2 (two) times daily.   Marland Kitchen gabapentin (NEURONTIN) 400 MG capsule TAKE 1 CAPSULE BY MOUTH 4 TIMES DAILY FOR 30 DAYS  . HYDROcodone-acetaminophen (NORCO/VICODIN) 5-325 MG tablet Take 1 tablet by mouth 2 (two) times daily as needed for moderate pain.  Marland Kitchen levothyroxine (SYNTHROID) 88 MCG tablet Take 88 mcg by mouth daily before breakfast.  . lisinopril (PRINIVIL,ZESTRIL) 20 MG tablet Take 20 mg by mouth every evening.   . Multiple Vitamin (MULTIVITAMIN WITH MINERALS) TABS tablet Take 1 tablet by mouth daily.  . Omega-3 Fatty Acids (FISH OIL) 1000 MG CPDR Take by mouth daily.  . pantoprazole (PROTONIX) 40 MG tablet Take 1 tablet (40 mg total) by mouth daily before breakfast.  . Red Yeast Rice Extract (RED YEAST RICE PO) Take by mouth in the morning and at bedtime.  . sertraline (ZOLOFT) 100 MG tablet Take 1.5 tablets (150 mg total) by mouth daily.  . Vitamin D, Ergocalciferol, (DRISDOL) 1.25 MG (50000 UNIT) CAPS capsule TAKE 1 CAPSULE BY MOUTH TWICE A WEEK FOR 4 MONTHS THEN REDUCE TO ONCE WEEKLY  . diazepam (VALIUM) 5 MG tablet Take 1 tablet (5 mg total) by mouth at bedtime. (Patient not taking: Reported on 01/18/2020)  . [DISCONTINUED] ALLERGY RELIEF 180 MG tablet Take 180 mg by mouth daily. (Patient not taking: Reported on 01/18/2020)  . [DISCONTINUED] Calcium Carbonate-Vit D-Min (CALTRATE 600+D PLUS PO) Take by  mouth daily. (Patient not taking: Reported on 01/18/2020)  . [DISCONTINUED] EUTHYROX 75 MCG tablet Take 75 mcg by mouth daily. (Patient not taking: Reported on 01/18/2020)  . [DISCONTINUED] nystatin-triamcinolone (MYCOLOG II) cream Apply 1 application topically 2 (two) times daily. (Patient not taking: Reported on 01/18/2020)   No facility-administered encounter medications on file as of 01/18/2020.     Objective: Blood pressure (!) 147/75, pulse 81, temperature 98.6 F (37 C), temperature source Oral, height 5' 2"  (1.575  m), weight 165 lb (74.8 kg). Patient is alert and in no acute distress. She is wearing a mask. Conjunctiva is pink. Sclera is nonicteric Oropharyngeal mucosa is normal. She has partial upper dental plate. No neck masses or thyromegaly noted. Cardiac exam with regular rhythm normal S1 and S2.  She has faint systolic murmur at aortic area. Lungs are clear to auscultation. Abdomen is full.  Bowel sounds are normal.  She has long right subcostal and midline scars.  On palpation is soft and nontender with organomegaly or masses. No LE edema or clubbing noted. She has scarring and small ecchymosis to skin of both 4 hands.  Labs/studies Results:  CBC Latest Ref Rng & Units 02/17/2019 08/28/2017 08/27/2017  WBC 3.8 - 10.8 Thousand/uL 4.6 6.0 7.5  Hemoglobin 11.7 - 15.5 g/dL 11.9 8.9(L) 9.3(L)  Hematocrit 35 - 45 % 35.7 26.2(L) 26.9(L)  Platelets 140 - 400 Thousand/uL 136(L) 147(L) 148(L)    CMP Latest Ref Rng & Units 05/10/2019 02/17/2019 08/27/2017  Glucose 65 - 139 mg/dL - 103 92  BUN 7 - 25 mg/dL - 18 17  Creatinine 0.44 - 1.00 mg/dL 1.10(H) 1.27(H) 1.03(H)  Sodium 135 - 146 mmol/L - 142 139  Potassium 3.5 - 5.3 mmol/L - 3.9 4.5  Chloride 98 - 110 mmol/L - 109 109  CO2 20 - 32 mmol/L - 24 23  Calcium 8.6 - 10.4 mg/dL - 9.4 8.7(L)  Total Protein 6.1 - 8.1 g/dL - 6.9 -  Total Bilirubin 0.2 - 1.2 mg/dL - 0.5 -  Alkaline Phos 38 - 126 U/L - - -  AST 10 - 35 U/L - 21 -  ALT 6 - 29 U/L - 10 -    Hepatic Function Latest Ref Rng & Units 02/17/2019 08/20/2017 09/03/2016  Total Protein 6.1 - 8.1 g/dL 6.9 7.8 7.0  Albumin 3.5 - 5.0 g/dL - 4.3 4.0  AST 10 - 35 U/L 21 26 23   ALT 6 - 29 U/L 10 12(L) 11(L)  Alk Phosphatase 38 - 126 U/L - 122 112  Total Bilirubin 0.2 - 1.2 mg/dL 0.5 0.8 0.7    Lab Results  Component Value Date   CRP 0.7 02/17/2019      Assessment:  #1.  Chronic GERD.  She is doing well with single dose PPI.  Will try her on every other day schedule and see how she does.  #2.   Chronic diarrhea felt to be secondary to prior right hemicolectomy and irritating effect of bile on the colonic mucosa.  Cholestyramine is helping but difficult for her to take.  Therefore will try Imodium OTC prior to considering diphenoxylate.  #3.  History of Crohn's disease.  Last colonoscopy was in December 2019 and revealing small ulcer anastomosis and tiny fistula.  She is presently on no maintenance therapy.  #4.  Anal irritation secondary to hemorrhoids triggered by diarrhea.   Plan:  Decrease pantoprazole to 40 mg p.o. every morning. Mycolog to cream to be applied to anal canal twice daily for  2 weeks and thereafter on as-needed basis. Discontinue Questran/cholestyramine. Imodium/loperamide OTC 2 mg before breakfast and lunch daily. Patient will call with progress report in 4 weeks. Office visit in 6 months.

## 2020-01-18 NOTE — Patient Instructions (Signed)
Can try Pantoprazole every other day . Use Mycolog II cream twice daily for two weeks then as needed. Call office with progress report in 4 weeks,

## 2020-01-21 ENCOUNTER — Other Ambulatory Visit: Payer: Self-pay | Admitting: Nephrology

## 2020-01-21 ENCOUNTER — Other Ambulatory Visit (HOSPITAL_COMMUNITY): Payer: Self-pay | Admitting: Nephrology

## 2020-01-21 DIAGNOSIS — D696 Thrombocytopenia, unspecified: Secondary | ICD-10-CM | POA: Diagnosis not present

## 2020-01-21 DIAGNOSIS — I10 Essential (primary) hypertension: Secondary | ICD-10-CM

## 2020-01-21 DIAGNOSIS — E559 Vitamin D deficiency, unspecified: Secondary | ICD-10-CM

## 2020-01-21 DIAGNOSIS — N1831 Chronic kidney disease, stage 3a: Secondary | ICD-10-CM | POA: Diagnosis not present

## 2020-01-21 DIAGNOSIS — Z79899 Other long term (current) drug therapy: Secondary | ICD-10-CM | POA: Diagnosis not present

## 2020-01-21 DIAGNOSIS — I129 Hypertensive chronic kidney disease with stage 1 through stage 4 chronic kidney disease, or unspecified chronic kidney disease: Secondary | ICD-10-CM | POA: Diagnosis not present

## 2020-02-14 DIAGNOSIS — Z1231 Encounter for screening mammogram for malignant neoplasm of breast: Secondary | ICD-10-CM | POA: Diagnosis not present

## 2020-02-17 ENCOUNTER — Ambulatory Visit (HOSPITAL_COMMUNITY)
Admission: RE | Admit: 2020-02-17 | Discharge: 2020-02-17 | Disposition: A | Discharge status: Home / Self Care | Payer: Medicare Other | Source: Ambulatory Visit | Attending: Nephrology | Admitting: Nephrology

## 2020-02-17 ENCOUNTER — Other Ambulatory Visit: Payer: Self-pay

## 2020-02-17 DIAGNOSIS — I10 Essential (primary) hypertension: Secondary | ICD-10-CM | POA: Insufficient documentation

## 2020-02-17 DIAGNOSIS — E559 Vitamin D deficiency, unspecified: Secondary | ICD-10-CM | POA: Insufficient documentation

## 2020-02-17 DIAGNOSIS — N1831 Chronic kidney disease, stage 3a: Secondary | ICD-10-CM | POA: Diagnosis not present

## 2020-02-17 DIAGNOSIS — D696 Thrombocytopenia, unspecified: Secondary | ICD-10-CM | POA: Diagnosis not present

## 2020-02-17 DIAGNOSIS — N189 Chronic kidney disease, unspecified: Secondary | ICD-10-CM | POA: Diagnosis not present

## 2020-02-23 ENCOUNTER — Telehealth (HOSPITAL_COMMUNITY): Payer: Medicare Other | Admitting: Psychiatry

## 2020-02-23 DIAGNOSIS — D696 Thrombocytopenia, unspecified: Secondary | ICD-10-CM | POA: Diagnosis not present

## 2020-02-23 DIAGNOSIS — N1831 Chronic kidney disease, stage 3a: Secondary | ICD-10-CM | POA: Diagnosis not present

## 2020-02-23 DIAGNOSIS — I129 Hypertensive chronic kidney disease with stage 1 through stage 4 chronic kidney disease, or unspecified chronic kidney disease: Secondary | ICD-10-CM | POA: Diagnosis not present

## 2020-02-23 DIAGNOSIS — M542 Cervicalgia: Secondary | ICD-10-CM | POA: Diagnosis not present

## 2020-02-23 DIAGNOSIS — Z79899 Other long term (current) drug therapy: Secondary | ICD-10-CM | POA: Diagnosis not present

## 2020-02-23 DIAGNOSIS — E559 Vitamin D deficiency, unspecified: Secondary | ICD-10-CM | POA: Diagnosis not present

## 2020-02-23 DIAGNOSIS — G2581 Restless legs syndrome: Secondary | ICD-10-CM | POA: Diagnosis not present

## 2020-02-23 DIAGNOSIS — M545 Low back pain: Secondary | ICD-10-CM | POA: Diagnosis not present

## 2020-02-23 DIAGNOSIS — G5603 Carpal tunnel syndrome, bilateral upper limbs: Secondary | ICD-10-CM | POA: Diagnosis not present

## 2020-02-29 ENCOUNTER — Telehealth (INDEPENDENT_AMBULATORY_CARE_PROVIDER_SITE_OTHER): Payer: Medicare Other | Admitting: Psychiatry

## 2020-02-29 ENCOUNTER — Other Ambulatory Visit: Payer: Self-pay

## 2020-02-29 ENCOUNTER — Encounter (HOSPITAL_COMMUNITY): Payer: Self-pay | Admitting: Psychiatry

## 2020-02-29 DIAGNOSIS — F331 Major depressive disorder, recurrent, moderate: Secondary | ICD-10-CM | POA: Diagnosis not present

## 2020-02-29 MED ORDER — SERTRALINE HCL 100 MG PO TABS: 150.0000 mg | ORAL_TABLET | Freq: Every day | ORAL | 3 refills | Status: DC

## 2020-02-29 NOTE — Progress Notes (Signed)
Virtual Visit via Video Note  I connected with Jessica [name] on 02/29/20 at  2:40 PM EDT by a video enabled telemedicine application and verified that I am speaking with the correct person using two identifiers.   I discussed the limitations of evaluation and management by telemedicine and the availability of in person appointments. The patient expressed understanding and agreed to proceed.    I discussed the assessment and treatment plan with the patient. The patient was provided an opportunity to ask questions and all were answered. The patient agreed with the plan and demonstrated an understanding of the instructions.   The patient was advised to call back or seek an in-person evaluation if the symptoms worsen or if the condition fails to improve as anticipated.  I provided 15 minutes of non-face-to-face time during this encounter. Location: Provider office, patient home  [name], MD  [facility] MD/PA/NP OP Progress Note  02/29/2020 3:23 PM Jessica [name]  MRN:  [id number]  Chief Complaint:  Chief Complaint    Depression; Anxiety; Follow-up     HPI: This patient is a 69 year old married white female who lives with her husband in [place].  Her mother-in-law has also moved in.  She has 2 grown sons.  She retired as a Magazine features editor.  The patient returns for follow-up after 3 months.  She is now taking Zoloft 150 mg daily consistently.  She thinks it is helped her mood and anxiety.  She is sleeping fairly well but she does get pain in her legs and feet.  She is being worked up for chronic kidney disease right now.  However she states overall she feels pretty good.  She denies severe depression anxiety thoughts of self-harm or suicide.  She tried the Valium and it made her so sleepy so she discontinued it. Visit Diagnosis:    ICD-10-CM   1. Major depressive disorder, recurrent episode, moderate (HCC)  F33.1 sertraline (ZOLOFT) 100 MG tablet    Past Psychiatric History: Former  patient in this office  Past Medical History:  Past Medical History:  Diagnosis Date  . Anxiety attack   . Arthritis   . Back pain, chronic   . Crohn's colitis ([place])   . Depression (emotion)   . Dysrhythmia   . GERD (gastroesophageal reflux disease)   . Hypertension   . Hypothyroid   . Neuropathy   . Sleep apnea    cpap is not working per patient - needs to find a new doctor - not used since 7/16 per pat   . Vertigo    followed by Dr Wardell Heath- in [place]     Past Surgical History:  Procedure Laterality Date  . ABDOMINAL HYSTERECTOMY    . BIOPSY  06/08/2018   Procedure: BIOPSY;  Surgeon: Rogene Houston, MD;  [facility];  Service: Endoscopy;;  (colon)  . CHOLECYSTECTOMY    . COLON SURGERY     for Crohn's in the 56s in [place]  . COLONOSCOPY N/A 03/11/2013   Procedure: COLONOSCOPY;  Surgeon: Rogene Houston, MD;  [facility];  Service: Endoscopy;  Laterality: N/A;  1200  . COLONOSCOPY N/A 06/08/2018   Procedure: COLONOSCOPY;  Surgeon: Rogene Houston, MD;  [facility];  Service: Endoscopy;  Laterality: N/A;  8:25  . fatty tumor removed    . HARDWARE REMOVAL Right 08/07/2016   Procedure: HARDWARE REMOVAL;  Surgeon: Gaynelle Arabian, MD;  [facility];  Service: Orthopedics;  Laterality: Right;  .  Hip replacement      Rt hip in 2010 in [place]  . KNEE ARTHROSCOPY Left 03/29/2015   Procedure: ARTHROSCOPY LEFT KNEE WITH MENSICAL DEBRIDEMENT, chondroplasty;  Surgeon: Gaynelle Arabian, MD;  [facility];  Service: Orthopedics;  Laterality: Left;  . ORIF TIBIA PLATEAU Right 02/29/2016   Procedure: OPEN REDUCTION INTERNAL FIXATION (ORIF RIGHT  TIBIAL PLATEAU FRACTURE;  Surgeon: Gaynelle Arabian, MD;  [facility];  Service: Orthopedics;  Laterality: Right;  . TOTAL KNEE ARTHROPLASTY Right 08/25/2017   Procedure: TOTAL RIGHT KNEE ARTHROPLASTY;  Surgeon: Gaynelle Arabian, MD;  [facility];  Service: Orthopedics;  Laterality: Right;    Family  Psychiatric History: see below  Family History:  Family History  Problem Relation Age of Onset  . Crohn's disease Brother   . Depression Mother   . Anxiety disorder Mother   . Depression Sister   . Aneurysm Father   . Colon cancer Neg Hx     Social History:  Social History   Socioeconomic History  . Marital status: Married    Spouse name: Not on file  . Number of children: Not on file  . Years of education: Not on file  . Highest education level: Not on file  Occupational History  . Not on file  Tobacco Use  . Smoking status: Never Smoker  . Smokeless tobacco: Never Used  Vaping Use  . Vaping Use: Never used  Substance and Sexual Activity  . Alcohol use: No    Alcohol/week: 0.0 standard drinks  . Drug use: No  . Sexual activity: Not Currently  Other Topics Concern  . Not on file  Social History Narrative  . Not on file   Social Determinants of Health   Financial Resource Strain:   . Difficulty of Paying Living Expenses: Not on file  Food Insecurity:   . Worried About Charity fundraiser in the Last Year: Not on file  . Ran Out of Food in the Last Year: Not on file  Transportation Needs:   . Lack of Transportation (Medical): Not on file  . Lack of Transportation (Non-Medical): Not on file  Physical Activity:   . Days of Exercise per Week: Not on file  . Minutes of Exercise per Session: Not on file  Stress:   . Feeling of Stress : Not on file  Social Connections:   . Frequency of Communication with Friends and Family: Not on file  . Frequency of Social Gatherings with Friends and Family: Not on file  . Attends Religious Services: Not on file  . Active Member of Clubs or Organizations: Not on file  . Attends Archivist Meetings: Not on file  . Marital Status: Not on file    Allergies:  Allergies  Allergen Reactions  . Morphine And Related Hives and Itching    May cause blood pressure to drop  . Robaxin [Methocarbamol] Itching    (PT WILL  RESUME TAKING TO SEE IF THIS IS HER ALLERGY)  . Sulfa Antibiotics Itching    ALL SULFA DRUGS    Metabolic Disorder Labs: No results found for: HGBA1C, MPG No results found for: PROLACTIN No results found for: CHOL, TRIG, HDL, CHOLHDL, VLDL, LDLCALC No results found for: TSH  Therapeutic Level Labs: No results found for: LITHIUM No results found for: VALPROATE No components found for:  CBMZ  Current Medications: Current Outpatient Medications  Medication Sig Dispense Refill  . alendronate (FOSAMAX) 70 MG tablet Take 70 mg by mouth once  a week. Take with a full glass of water on an empty stomach.    . Ascorbic Acid (VITAMIN C) 1000 MG tablet Take 2,000 mg by mouth daily.    . diphenhydrAMINE-PE-APAP 25-5-325 MG TABS Take by mouth in the morning and at bedtime.    . fluticasone (FLONASE) 50 MCG/ACT nasal spray Place 1 spray into the nose 2 (two) times daily.     Marland Kitchen gabapentin (NEURONTIN) 400 MG capsule TAKE 1 CAPSULE BY MOUTH 4 TIMES DAILY FOR 30 DAYS    . HYDROcodone-acetaminophen (NORCO/VICODIN) 5-325 MG tablet Take 1 tablet by mouth 2 (two) times daily as needed for moderate pain.    Marland Kitchen levothyroxine (SYNTHROID) 88 MCG tablet Take 88 mcg by mouth daily before breakfast.    . lisinopril (PRINIVIL,ZESTRIL) 20 MG tablet Take 20 mg by mouth every evening.     . loperamide (IMODIUM) 2 MG capsule Take 1 capsule (2 mg total) by mouth in the morning and at bedtime. 60 capsule 5  . Multiple Vitamin (MULTIVITAMIN WITH MINERALS) TABS tablet Take 1 tablet by mouth daily.    Marland Kitchen nystatin-triamcinolone (MYCOLOG II) cream Apply 1 application topically 2 (two) times daily. 30 g 1  . Omega-3 Fatty Acids (FISH OIL) 1000 MG CPDR Take by mouth daily.    . pantoprazole (PROTONIX) 40 MG tablet Take 1 tablet (40 mg total) by mouth daily before breakfast. 90 tablet 1  . Red Yeast Rice Extract (RED YEAST RICE PO) Take by mouth in the morning and at bedtime.    . sertraline (ZOLOFT) 100 MG tablet Take 1.5 tablets  (150 mg total) by mouth daily. 135 tablet 3  . Vitamin D, Ergocalciferol, (DRISDOL) 1.25 MG (50000 UNIT) CAPS capsule TAKE 1 CAPSULE BY MOUTH TWICE A WEEK FOR 4 MONTHS THEN REDUCE TO ONCE WEEKLY     No current facility-administered medications for this visit.     Musculoskeletal: Strength & Muscle Tone: within normal limits Gait & Station: normal Patient leans: N/A  Psychiatric Specialty Exam: Review of Systems  HENT: Positive for hearing loss.   Musculoskeletal: Positive for myalgias.  All other systems reviewed and are negative.   There were no vitals taken for this visit.There is no height or weight on file to calculate BMI.  General Appearance: Casual and Fairly Groomed  Eye Contact:  Good  Speech:  Clear and Coherent  Volume:  Normal  Mood:  Euthymic  Affect:  Appropriate and Congruent  Thought Process:  Goal Directed  Orientation:  Full (Time, Place, and Person)  Thought Content: Rumination   Suicidal Thoughts:  No  Homicidal Thoughts:  No  Memory:  Immediate;   Good Recent;   Good Remote;   Good  Judgement:  Good  Insight:  Fair  Psychomotor Activity:  Normal  Concentration:  Concentration: Good and Attention Span: Good  Recall:  Good  Fund of Knowledge: Good  Language: Good  Akathisia:  No  Handed:  Right  AIMS (if indicated): not done  Assets:  Communication Skills Desire for Improvement Resilience Social Support Talents/Skills  ADL's:  Intact  Cognition: WNL  Sleep:  Good   Screenings:   Assessment and Plan: This patient is a 69 year old female with a history of depression anxiety.  She seems to be doing better with Zoloft 150 mg daily.  She will continue this regimen and return to see me in 3 months   [name], MD 02/29/2020, 3:23 PM

## 2020-03-21 DIAGNOSIS — M5417 Radiculopathy, lumbosacral region: Secondary | ICD-10-CM | POA: Diagnosis not present

## 2020-03-21 DIAGNOSIS — G2581 Restless legs syndrome: Secondary | ICD-10-CM | POA: Diagnosis not present

## 2020-03-21 DIAGNOSIS — G5601 Carpal tunnel syndrome, right upper limb: Secondary | ICD-10-CM | POA: Diagnosis not present

## 2020-03-21 DIAGNOSIS — I7300 Raynaud's syndrome without gangrene: Secondary | ICD-10-CM | POA: Diagnosis not present

## 2020-03-21 DIAGNOSIS — Z79899 Other long term (current) drug therapy: Secondary | ICD-10-CM | POA: Diagnosis not present

## 2020-03-21 DIAGNOSIS — M5412 Radiculopathy, cervical region: Secondary | ICD-10-CM | POA: Diagnosis not present

## 2020-03-21 DIAGNOSIS — G603 Idiopathic progressive neuropathy: Secondary | ICD-10-CM | POA: Diagnosis not present

## 2020-03-21 DIAGNOSIS — R270 Ataxia, unspecified: Secondary | ICD-10-CM | POA: Diagnosis not present

## 2020-03-30 DIAGNOSIS — Z885 Allergy status to narcotic agent status: Secondary | ICD-10-CM | POA: Diagnosis not present

## 2020-03-30 DIAGNOSIS — H9311 Tinnitus, right ear: Secondary | ICD-10-CM | POA: Diagnosis not present

## 2020-03-30 DIAGNOSIS — Z882 Allergy status to sulfonamides status: Secondary | ICD-10-CM | POA: Diagnosis not present

## 2020-03-30 DIAGNOSIS — H903 Sensorineural hearing loss, bilateral: Secondary | ICD-10-CM | POA: Diagnosis not present

## 2020-03-30 DIAGNOSIS — R270 Ataxia, unspecified: Secondary | ICD-10-CM | POA: Diagnosis not present

## 2020-03-30 DIAGNOSIS — Z79899 Other long term (current) drug therapy: Secondary | ICD-10-CM | POA: Diagnosis not present

## 2020-03-30 DIAGNOSIS — D333 Benign neoplasm of cranial nerves: Secondary | ICD-10-CM | POA: Diagnosis not present

## 2020-04-05 DIAGNOSIS — H818X9 Other disorders of vestibular function, unspecified ear: Secondary | ICD-10-CM | POA: Diagnosis not present

## 2020-04-05 DIAGNOSIS — D333 Benign neoplasm of cranial nerves: Secondary | ICD-10-CM | POA: Diagnosis not present

## 2020-04-26 DIAGNOSIS — D333 Benign neoplasm of cranial nerves: Secondary | ICD-10-CM | POA: Diagnosis not present

## 2020-04-26 DIAGNOSIS — H903 Sensorineural hearing loss, bilateral: Secondary | ICD-10-CM | POA: Diagnosis not present

## 2020-05-01 DIAGNOSIS — Z23 Encounter for immunization: Secondary | ICD-10-CM | POA: Diagnosis not present

## 2020-05-05 DIAGNOSIS — H903 Sensorineural hearing loss, bilateral: Secondary | ICD-10-CM | POA: Diagnosis not present

## 2020-05-05 DIAGNOSIS — H9311 Tinnitus, right ear: Secondary | ICD-10-CM | POA: Diagnosis not present

## 2020-05-05 DIAGNOSIS — H832X1 Labyrinthine dysfunction, right ear: Secondary | ICD-10-CM | POA: Diagnosis not present

## 2020-05-05 DIAGNOSIS — D333 Benign neoplasm of cranial nerves: Secondary | ICD-10-CM | POA: Diagnosis not present

## 2020-05-05 DIAGNOSIS — Z882 Allergy status to sulfonamides status: Secondary | ICD-10-CM | POA: Diagnosis not present

## 2020-05-05 DIAGNOSIS — Z885 Allergy status to narcotic agent status: Secondary | ICD-10-CM | POA: Diagnosis not present

## 2020-05-16 ENCOUNTER — Ambulatory Visit: Payer: No Typology Code available for payment source | Admitting: Podiatry

## 2020-05-17 DIAGNOSIS — Z23 Encounter for immunization: Secondary | ICD-10-CM | POA: Diagnosis not present

## 2020-05-26 DIAGNOSIS — Z96651 Presence of right artificial knee joint: Secondary | ICD-10-CM | POA: Diagnosis not present

## 2020-05-26 DIAGNOSIS — M7062 Trochanteric bursitis, left hip: Secondary | ICD-10-CM | POA: Diagnosis not present

## 2020-05-26 DIAGNOSIS — Z96652 Presence of left artificial knee joint: Secondary | ICD-10-CM | POA: Diagnosis not present

## 2020-05-30 ENCOUNTER — Other Ambulatory Visit: Payer: Self-pay

## 2020-05-30 ENCOUNTER — Encounter: Payer: Self-pay | Admitting: Podiatry

## 2020-05-30 ENCOUNTER — Ambulatory Visit (INDEPENDENT_AMBULATORY_CARE_PROVIDER_SITE_OTHER): Payer: Medicare Other | Admitting: Podiatry

## 2020-05-30 DIAGNOSIS — G5761 Lesion of plantar nerve, right lower limb: Secondary | ICD-10-CM

## 2020-05-30 DIAGNOSIS — G5762 Lesion of plantar nerve, left lower limb: Secondary | ICD-10-CM | POA: Diagnosis not present

## 2020-05-30 DIAGNOSIS — M7751 Other enthesopathy of right foot: Secondary | ICD-10-CM | POA: Diagnosis not present

## 2020-05-30 DIAGNOSIS — G5782 Other specified mononeuropathies of left lower limb: Secondary | ICD-10-CM

## 2020-05-30 DIAGNOSIS — M7752 Other enthesopathy of left foot: Secondary | ICD-10-CM

## 2020-05-30 DIAGNOSIS — G5781 Other specified mononeuropathies of right lower limb: Secondary | ICD-10-CM

## 2020-05-30 MED ORDER — DEXAMETHASONE SODIUM PHOSPHATE 120 MG/30ML IJ SOLN
4.0000 mg | Freq: Once | INTRAMUSCULAR | Status: AC
  Administered 2020-05-30: 4 mg via INTRA_ARTICULAR

## 2020-05-30 MED ORDER — TRIAMCINOLONE ACETONIDE 40 MG/ML IJ SUSP
20.0000 mg | Freq: Once | INTRAMUSCULAR | Status: AC
  Administered 2020-05-30: 20 mg

## 2020-05-30 NOTE — Progress Notes (Signed)
She presents today for follow-up of her foot she states that everything hurts worse now than it did.  States that her back to bother her so her sciatica is bothering her she was provided with gabapentin but has not started taking it yet.  She states that my feet are just killing me my left leg is hurts all the time.  She states that they really bother her when she goes to bed during the day they are nontender.  Objective: Vital signs are stable she is alert oriented x3 pulses are palpable.  She has reproducible pain on palpation of the sinus tarsi and the third interdigital space.  Palpable Mulder's click is noted.  Assessment: Neuroma third interspace bilateral sinus tarsitis and obvious peripheral neuropathy most likely associated with back problems.  Plan: Discussed etiology pathology and surgical therapies encouraged her to start her gabapentin.  I went ahead and reinjected the sinus tarsi today and the third interdigital space bilaterally with Kenalog and local anesthetic tolerated procedure well without complications I would like to follow-up with her in 1 month make sure she has started her medications.

## 2020-06-06 DIAGNOSIS — I10 Essential (primary) hypertension: Secondary | ICD-10-CM | POA: Diagnosis not present

## 2020-06-06 DIAGNOSIS — R270 Ataxia, unspecified: Secondary | ICD-10-CM | POA: Diagnosis not present

## 2020-06-06 DIAGNOSIS — M25561 Pain in right knee: Secondary | ICD-10-CM | POA: Diagnosis not present

## 2020-06-06 DIAGNOSIS — G2581 Restless legs syndrome: Secondary | ICD-10-CM | POA: Diagnosis not present

## 2020-06-06 DIAGNOSIS — M25562 Pain in left knee: Secondary | ICD-10-CM | POA: Diagnosis not present

## 2020-06-06 DIAGNOSIS — E785 Hyperlipidemia, unspecified: Secondary | ICD-10-CM | POA: Diagnosis not present

## 2020-06-06 DIAGNOSIS — M545 Low back pain, unspecified: Secondary | ICD-10-CM | POA: Diagnosis not present

## 2020-06-22 DIAGNOSIS — H832X1 Labyrinthine dysfunction, right ear: Secondary | ICD-10-CM | POA: Diagnosis not present

## 2020-06-22 DIAGNOSIS — H903 Sensorineural hearing loss, bilateral: Secondary | ICD-10-CM | POA: Diagnosis not present

## 2020-06-22 DIAGNOSIS — H938X1 Other specified disorders of right ear: Secondary | ICD-10-CM | POA: Diagnosis not present

## 2020-06-22 DIAGNOSIS — H9311 Tinnitus, right ear: Secondary | ICD-10-CM | POA: Diagnosis not present

## 2020-06-22 DIAGNOSIS — D333 Benign neoplasm of cranial nerves: Secondary | ICD-10-CM | POA: Diagnosis not present

## 2020-07-04 ENCOUNTER — Ambulatory Visit: Payer: Medicare Other | Admitting: Podiatry

## 2020-07-25 ENCOUNTER — Ambulatory Visit (INDEPENDENT_AMBULATORY_CARE_PROVIDER_SITE_OTHER): Payer: Medicare Other | Admitting: Internal Medicine

## 2020-07-26 ENCOUNTER — Encounter (INDEPENDENT_AMBULATORY_CARE_PROVIDER_SITE_OTHER): Payer: Self-pay

## 2020-07-31 ENCOUNTER — Telehealth (INDEPENDENT_AMBULATORY_CARE_PROVIDER_SITE_OTHER): Payer: Medicare Other | Admitting: Internal Medicine

## 2020-07-31 ENCOUNTER — Encounter (INDEPENDENT_AMBULATORY_CARE_PROVIDER_SITE_OTHER): Payer: Self-pay | Admitting: Internal Medicine

## 2020-07-31 ENCOUNTER — Other Ambulatory Visit: Payer: Self-pay

## 2020-07-31 VITALS — Ht 62.0 in | Wt 166.0 lb

## 2020-07-31 DIAGNOSIS — K529 Noninfective gastroenteritis and colitis, unspecified: Secondary | ICD-10-CM | POA: Diagnosis not present

## 2020-07-31 DIAGNOSIS — K219 Gastro-esophageal reflux disease without esophagitis: Secondary | ICD-10-CM

## 2020-07-31 DIAGNOSIS — K50013 Crohn's disease of small intestine with fistula: Secondary | ICD-10-CM

## 2020-07-31 MED ORDER — LOPERAMIDE HCL 2 MG PO CAPS: 2.0000 mg | ORAL_CAPSULE | Freq: Three times a day (TID) | ORAL | 2 refills | Status: DC

## 2020-07-31 MED ORDER — DICYCLOMINE HCL 10 MG PO CAPS: 10.0000 mg | ORAL_CAPSULE | Freq: Two times a day (BID) | ORAL | 5 refills | Status: DC

## 2020-07-31 MED ORDER — PANTOPRAZOLE SODIUM 40 MG PO TBEC: 40.0000 mg | DELAYED_RELEASE_TABLET | Freq: Every day | ORAL | 3 refills | Status: DC

## 2020-07-31 NOTE — Progress Notes (Signed)
Virtual Visit via Telephone Note  I connected with Jessica [name] on 07/31/20 at 11:49 PM EST by telephone and verified that I am speaking with the correct person using two identifiers.   Location: Patient: home Provider: office   I discussed the limitations, risks, security and privacy concerns of performing an evaluation and management service by telephone and the availability of in person appointments. I also discussed with the patient that there may be a patient responsible charge related to this service. The patient expressed understanding and agreed to proceed.   History of Present Illness:  Patient is 70 year old Caucasian female who was scheduled for virtual video visit.  Video visit could not be accomplished because of connection issues at her end.  Therefore we proceeded to do telephone visit.  Patient states she was in [place] and ran out of pantoprazole over a week ago.  Ever since she has been having daily severe heartburn as well as burning in her epigastrium.  She has not had nausea or vomiting.  She states she was doing very well while she was on pantoprazole and did not have any side effects.  She is requesting a refill.  She continues to complain of diarrhea.  She has anywhere from 3-6 stools per day.  Most of her stools are loose.  She has urgency and accidents.  She is using pads.  She rarely has nocturnal bowel movement.  She takes pain medication at night usually 1 or 1.5 tablets daily for pain so she could rest.  She has been on cholestyramine in the past and it did not work to her satisfaction.  She has been on dicyclomine, loperamide and diphenoxylate in the past but she says none of these medications worked very well.  When I asked her how often she was taking Imodium and/or diphenoxylate.  She would usually take 1 pill a day.  She is using Mycolog to cream only when she has diarrhea resulting in perianal irritation.  She denies abdominal pain cramping melena or rectal  bleeding.  She states she has not lost any weight and her weight ranges between 165 and 166 pounds.  Medication list was reviewed with the patient and was updated.    Observations/Objective:  Weight 166 pounds. No recent lab on file.  Assessment and Plan:  #1.  Crohn's disease.  Patient was diagnosed with small bowel Crohn's disease, status post remote right hemicolectomy.  She appears to be in remission based on abdominal pelvic CT and MR enterography of November 2020. 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 Persistent diarrhea appears to be the result of loss of ileocecal valve and she may also have an element of IBS.  Patient advised to take dicyclomine 10 mg before breakfast and lunch. Patient advised to take loperamide 2 mg by mouth before each meal.  If she becomes constipated she will drop the dose to twice daily. She is also advised to keep stool diary as to frequency and consistency of stools and send [place] a summary after 1 month.  #2.  Chronic diarrhea.  She did have some response to cholestyramine but not to her satisfaction.  Similarly she has not responded to loperamide and diphenoxylate with she may not have been taking medication correctly.  She needs to be on scheduled dose rather than as needed.  If she does not respond to combination of dicyclomine and loperamide on  schedule will consider empiric course with antibiotic and further evaluation look for other sources of diarrhea.  #3.  GERD.  She was doing well with pantoprazole and now having intractable heartburn off medication.  She will be restarted on pantoprazole.  Pantoprazole prescription sent to patient's pharmacy.  Dose is 40 mg p.o. every morning or 30 minutes before breakfast.  90 doses with 3 refills.  Follow Up Instructions:  Patient will send [place] stool diary in 1  month. Office visit in 3 months. I discussed the assessment and treatment plan with the patient. The patient was provided an opportunity to ask questions and all were answered. The patient agreed with the plan and demonstrated an understanding of the instructions.   The patient was advised to call back or seek an in-person evaluation if the symptoms worsen or if the condition fails to improve as anticipated.  I provided 14  minutes of non-face-to-face time during this encounter. This time does not include completion of note and review of her records.   Hildred Laser, MD

## 2020-08-01 DIAGNOSIS — K5000 Crohn's disease of small intestine without complications: Secondary | ICD-10-CM

## 2020-08-01 DIAGNOSIS — K529 Noninfective gastroenteritis and colitis, unspecified: Secondary | ICD-10-CM | POA: Insufficient documentation

## 2020-08-01 HISTORY — DX: Crohn's disease of small intestine without complications: K50.00

## 2020-09-05 DIAGNOSIS — Z79899 Other long term (current) drug therapy: Secondary | ICD-10-CM | POA: Diagnosis not present

## 2020-09-05 DIAGNOSIS — G5603 Carpal tunnel syndrome, bilateral upper limbs: Secondary | ICD-10-CM | POA: Diagnosis not present

## 2020-09-05 DIAGNOSIS — R270 Ataxia, unspecified: Secondary | ICD-10-CM | POA: Diagnosis not present

## 2020-09-05 DIAGNOSIS — M545 Low back pain, unspecified: Secondary | ICD-10-CM | POA: Diagnosis not present

## 2020-09-05 DIAGNOSIS — G2581 Restless legs syndrome: Secondary | ICD-10-CM | POA: Diagnosis not present

## 2020-09-05 DIAGNOSIS — M542 Cervicalgia: Secondary | ICD-10-CM | POA: Diagnosis not present

## 2020-09-07 DIAGNOSIS — E785 Hyperlipidemia, unspecified: Secondary | ICD-10-CM | POA: Diagnosis not present

## 2020-09-07 DIAGNOSIS — R5383 Other fatigue: Secondary | ICD-10-CM | POA: Diagnosis not present

## 2020-09-07 DIAGNOSIS — Z789 Other specified health status: Secondary | ICD-10-CM | POA: Diagnosis not present

## 2020-09-07 DIAGNOSIS — E039 Hypothyroidism, unspecified: Secondary | ICD-10-CM | POA: Diagnosis not present

## 2020-09-07 DIAGNOSIS — E559 Vitamin D deficiency, unspecified: Secondary | ICD-10-CM | POA: Diagnosis not present

## 2020-09-07 DIAGNOSIS — I10 Essential (primary) hypertension: Secondary | ICD-10-CM | POA: Diagnosis not present

## 2020-09-07 DIAGNOSIS — Z299 Encounter for prophylactic measures, unspecified: Secondary | ICD-10-CM | POA: Diagnosis not present

## 2020-09-21 DIAGNOSIS — Z299 Encounter for prophylactic measures, unspecified: Secondary | ICD-10-CM | POA: Diagnosis not present

## 2020-09-21 DIAGNOSIS — I10 Essential (primary) hypertension: Secondary | ICD-10-CM | POA: Diagnosis not present

## 2020-09-21 DIAGNOSIS — J309 Allergic rhinitis, unspecified: Secondary | ICD-10-CM | POA: Diagnosis not present

## 2020-09-21 DIAGNOSIS — K5010 Crohn's disease of large intestine without complications: Secondary | ICD-10-CM | POA: Diagnosis not present

## 2020-09-21 DIAGNOSIS — N183 Chronic kidney disease, stage 3 unspecified: Secondary | ICD-10-CM | POA: Diagnosis not present

## 2020-09-26 DIAGNOSIS — N289 Disorder of kidney and ureter, unspecified: Secondary | ICD-10-CM | POA: Diagnosis not present

## 2020-10-04 DIAGNOSIS — E7849 Other hyperlipidemia: Secondary | ICD-10-CM | POA: Diagnosis not present

## 2020-10-04 DIAGNOSIS — E785 Hyperlipidemia, unspecified: Secondary | ICD-10-CM | POA: Diagnosis not present

## 2020-10-13 DIAGNOSIS — Z789 Other specified health status: Secondary | ICD-10-CM | POA: Diagnosis not present

## 2020-10-13 DIAGNOSIS — F331 Major depressive disorder, recurrent, moderate: Secondary | ICD-10-CM | POA: Diagnosis not present

## 2020-10-13 DIAGNOSIS — I10 Essential (primary) hypertension: Secondary | ICD-10-CM | POA: Diagnosis not present

## 2020-10-13 DIAGNOSIS — Z299 Encounter for prophylactic measures, unspecified: Secondary | ICD-10-CM | POA: Diagnosis not present

## 2020-10-18 DIAGNOSIS — Z23 Encounter for immunization: Secondary | ICD-10-CM | POA: Diagnosis not present

## 2020-10-18 DIAGNOSIS — K5010 Crohn's disease of large intestine without complications: Secondary | ICD-10-CM | POA: Diagnosis not present

## 2020-10-18 DIAGNOSIS — N183 Chronic kidney disease, stage 3 unspecified: Secondary | ICD-10-CM | POA: Diagnosis not present

## 2020-10-18 DIAGNOSIS — I10 Essential (primary) hypertension: Secondary | ICD-10-CM | POA: Diagnosis not present

## 2020-10-18 DIAGNOSIS — F331 Major depressive disorder, recurrent, moderate: Secondary | ICD-10-CM | POA: Diagnosis not present

## 2020-10-18 DIAGNOSIS — Z299 Encounter for prophylactic measures, unspecified: Secondary | ICD-10-CM | POA: Diagnosis not present

## 2020-10-20 DIAGNOSIS — E870 Hyperosmolality and hypernatremia: Secondary | ICD-10-CM | POA: Diagnosis not present

## 2020-10-20 DIAGNOSIS — D638 Anemia in other chronic diseases classified elsewhere: Secondary | ICD-10-CM | POA: Diagnosis not present

## 2020-10-20 DIAGNOSIS — I129 Hypertensive chronic kidney disease with stage 1 through stage 4 chronic kidney disease, or unspecified chronic kidney disease: Secondary | ICD-10-CM | POA: Diagnosis not present

## 2020-10-20 DIAGNOSIS — D696 Thrombocytopenia, unspecified: Secondary | ICD-10-CM | POA: Diagnosis not present

## 2020-10-20 DIAGNOSIS — N1831 Chronic kidney disease, stage 3a: Secondary | ICD-10-CM | POA: Diagnosis not present

## 2020-10-20 DIAGNOSIS — R768 Other specified abnormal immunological findings in serum: Secondary | ICD-10-CM | POA: Diagnosis not present

## 2020-10-20 DIAGNOSIS — E211 Secondary hyperparathyroidism, not elsewhere classified: Secondary | ICD-10-CM | POA: Diagnosis not present

## 2020-11-02 ENCOUNTER — Other Ambulatory Visit: Payer: Self-pay | Admitting: Orthopedic Surgery

## 2020-11-02 ENCOUNTER — Other Ambulatory Visit (HOSPITAL_COMMUNITY): Payer: Self-pay | Admitting: Orthopedic Surgery

## 2020-11-02 DIAGNOSIS — H35362 Drusen (degenerative) of macula, left eye: Secondary | ICD-10-CM | POA: Diagnosis not present

## 2020-11-02 DIAGNOSIS — Z96651 Presence of right artificial knee joint: Secondary | ICD-10-CM | POA: Diagnosis not present

## 2020-11-03 ENCOUNTER — Other Ambulatory Visit (HOSPITAL_COMMUNITY): Payer: Medicare Other

## 2020-11-03 ENCOUNTER — Ambulatory Visit (HOSPITAL_COMMUNITY): Payer: Medicare Other

## 2020-11-07 DIAGNOSIS — G2581 Restless legs syndrome: Secondary | ICD-10-CM | POA: Diagnosis not present

## 2020-11-07 DIAGNOSIS — Z79899 Other long term (current) drug therapy: Secondary | ICD-10-CM | POA: Diagnosis not present

## 2020-11-07 DIAGNOSIS — M545 Low back pain, unspecified: Secondary | ICD-10-CM | POA: Diagnosis not present

## 2020-11-07 DIAGNOSIS — R531 Weakness: Secondary | ICD-10-CM | POA: Diagnosis not present

## 2020-11-07 DIAGNOSIS — M542 Cervicalgia: Secondary | ICD-10-CM | POA: Diagnosis not present

## 2020-11-10 ENCOUNTER — Encounter (HOSPITAL_COMMUNITY)
Admission: RE | Admit: 2020-11-10 | Discharge: 2020-11-10 | Disposition: A | Discharge status: Home / Self Care | Payer: Medicare Other | Source: Ambulatory Visit | Attending: Orthopedic Surgery | Admitting: Orthopedic Surgery

## 2020-11-10 ENCOUNTER — Other Ambulatory Visit: Payer: Self-pay

## 2020-11-10 DIAGNOSIS — Z471 Aftercare following joint replacement surgery: Secondary | ICD-10-CM | POA: Diagnosis not present

## 2020-11-10 DIAGNOSIS — Z96651 Presence of right artificial knee joint: Secondary | ICD-10-CM | POA: Insufficient documentation

## 2020-11-10 DIAGNOSIS — M25561 Pain in right knee: Secondary | ICD-10-CM | POA: Diagnosis not present

## 2020-11-10 DIAGNOSIS — Z96653 Presence of artificial knee joint, bilateral: Secondary | ICD-10-CM | POA: Diagnosis not present

## 2020-11-10 MED ORDER — TECHNETIUM TC 99M MEDRONATE IV KIT
20.0000 | PACK | Freq: Once | INTRAVENOUS | Status: AC | PRN
  Administered 2020-11-10: 20 via INTRAVENOUS

## 2020-11-15 ENCOUNTER — Encounter: Payer: Self-pay | Admitting: Pulmonary Disease

## 2020-11-15 ENCOUNTER — Ambulatory Visit (INDEPENDENT_AMBULATORY_CARE_PROVIDER_SITE_OTHER): Payer: Medicare Other | Admitting: Pulmonary Disease

## 2020-11-15 ENCOUNTER — Ambulatory Visit (HOSPITAL_COMMUNITY)
Admission: RE | Admit: 2020-11-15 | Discharge: 2020-11-15 | Disposition: A | Discharge status: Home / Self Care | Payer: Medicare Other | Source: Ambulatory Visit | Attending: Pulmonary Disease | Admitting: Pulmonary Disease

## 2020-11-15 ENCOUNTER — Other Ambulatory Visit: Payer: Self-pay

## 2020-11-15 DIAGNOSIS — G4733 Obstructive sleep apnea (adult) (pediatric): Secondary | ICD-10-CM | POA: Diagnosis not present

## 2020-11-15 DIAGNOSIS — R0609 Other forms of dyspnea: Secondary | ICD-10-CM

## 2020-11-15 DIAGNOSIS — R0600 Dyspnea, unspecified: Secondary | ICD-10-CM | POA: Diagnosis not present

## 2020-11-15 NOTE — Progress Notes (Signed)
Subjective:    Patient ID: Jessica [name], female    DOB: 08-05-1950, 70 y.o.   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 She reports dyspnea on exertion.  She admits to weight gain of 10 to 20 pounds.  She denies wheezing, cough or sputum production, she reports symptoms ongoing for 3 to 6 months, no exertional chest pain.  She is a lifetime never smoker.  She admits to marijuana use in the 80s.  OSA was diagnosed in the 80s, she could not tolerate the mask, she thinks she was a mouth breather and the air would escape through her mouth.  Her husband has now noted loud snoring and witnessed apneas.  She reports excessive daytime somnolence.  Epworth sleepiness score is 18 and she reports sleepiness while sitting and reading, watching TV, as a passenger in a car not even while driving. Bedtime is around 9:30 PM, TV stays on through the night, sleep latency can be up to 2 hours.  She starts off sleeping on her side but rolls over on her back, wakes up frequently about 6 times including 2-3 bathroom visits and is out of bed by 8:30 AM feeling tired with dryness of mouth and occasional headaches There is no history suggestive of cataplexy, sleep paralysis or parasomnias  PMH -Crohn's disease, hypertension, hyperlipidemia, CKD stage III  Past Medical History:  Diagnosis Date  . Anxiety attack   . Arthritis   . Back pain, chronic   . Crohn's colitis ([place])   . Depression (emotion)   . Dysrhythmia   . GERD (gastroesophageal reflux disease)   . Hypertension   . Hypothyroid   . Neuropathy   . Sleep apnea    cpap is not working per patient - needs to find a new doctor - not used since 7/16 per pat   . Vertigo    followed by Dr Wardell Heath- in [place]    Past Surgical History:  Procedure Laterality Date  . ABDOMINAL HYSTERECTOMY    . BIOPSY  06/08/2018   Procedure: BIOPSY;  Surgeon: Rogene Houston, MD;  [facility];  Service: Endoscopy;;  (colon)  . CHOLECYSTECTOMY    . COLON SURGERY     for Crohn's in the 30s in [place]  . COLONOSCOPY N/A 03/11/2013   Procedure: COLONOSCOPY;  Surgeon: Rogene Houston, MD;  [facility];  Service: Endoscopy;  Laterality: N/A;  1200  . COLONOSCOPY N/A 06/08/2018   Procedure: COLONOSCOPY;  Surgeon: Rogene Houston, MD;  [facility];  Service: Endoscopy;  Laterality: N/A;  8:25  . fatty tumor removed    . HARDWARE REMOVAL Right 08/07/2016   Procedure: HARDWARE REMOVAL;  Surgeon: Gaynelle Arabian, MD;  [facility];  Service: Orthopedics;  Laterality: Right;  . Hip replacement      Rt hip in 2010 in [place]  . KNEE ARTHROSCOPY Left 03/29/2015   Procedure: ARTHROSCOPY LEFT KNEE WITH MENSICAL DEBRIDEMENT, chondroplasty;  Surgeon: Gaynelle Arabian, MD;  [facility];  Service: Orthopedics;  Laterality: Left;  . ORIF TIBIA PLATEAU Right 02/29/2016   Procedure: OPEN REDUCTION INTERNAL FIXATION (ORIF RIGHT  TIBIAL PLATEAU FRACTURE;  Surgeon: Gaynelle Arabian, MD;  [facility];  Service: Orthopedics;  Laterality: Right;  . TOTAL KNEE ARTHROPLASTY Right 08/25/2017   Procedure: TOTAL RIGHT KNEE ARTHROPLASTY;  Surgeon: Gaynelle Arabian, MD;  [facility];  Service: Orthopedics;  Laterality: Right;  Allergies  Allergen Reactions  . Morphine And Related Hives and Itching    May cause blood pressure to drop  . Robaxin [Methocarbamol] Itching    (PT WILL RESUME TAKING TO SEE IF THIS IS HER ALLERGY)  . Sulfa Antibiotics Itching    ALL SULFA DRUGS    Social History   Socioeconomic History  . Marital status: Married    Spouse name: Not on file  . Number of children: Not on file  . Years of education: Not on file  . Highest education level: Not on file  Occupational History  . Not on file  Tobacco Use  . Smoking status: Never Smoker  . Smokeless tobacco: Never Used  Vaping Use  . Vaping Use: Never used  Substance and Sexual  Activity  . Alcohol use: No    Alcohol/week: 0.0 standard drinks  . Drug use: No  . Sexual activity: Not Currently  Other Topics Concern  . Not on file  Social History Narrative  . Not on file   Social Determinants of Health   Financial Resource Strain: Not on file  Food Insecurity: Not on file  Transportation Needs: Not on file  Physical Activity: Not on file  Stress: Not on file  Social Connections: Not on file  Intimate Partner Violence: Not on file     Family History  Problem Relation Age of Onset  . Crohn's disease Brother   . Depression Mother   . Anxiety disorder Mother   . Depression Sister   . Aneurysm Father   . Colon cancer Neg Hx     Review of Systems Constitutional: negative for anorexia, fevers and sweats  Eyes: negative for irritation, redness and visual disturbance  Ears, nose, mouth, throat, and face: negative for earaches, epistaxis, nasal congestion and sore throat  Respiratory: negative for cough,sputum and wheezing  Cardiovascular: negative for chest pain, , lower extremity edema, orthopnea, palpitations and syncope  Gastrointestinal: negative for abdominal pain, constipation, diarrhea, melena, nausea and vomiting  Genitourinary:negative for dysuria, frequency and hematuria  Hematologic/lymphatic: negative for bleeding, easy bruising and lymphadenopathy  Musculoskeletal:negative for arthralgias, muscle weakness and stiff joints  Neurological: negative for coordination problems, gait problems, headaches and weakness  Endocrine: negative for diabetic symptoms including polydipsia, polyuria and weight loss     Objective:   Physical Exam  Gen. Pleasant, obese, in no distress ENT - no lesions, no post nasal drip, hard of hearing, class 2 airway Neck: No JVD, no thyromegaly, no carotid bruits Lungs: no use of accessory muscles, no dullness to percussion, decreased without rales or rhonchi  Cardiovascular: Rhythm regular, heart sounds  normal, no  murmurs or gallops, no peripheral edema Musculoskeletal: No deformities, no cyanosis or clubbing , no tremors       Assessment & Plan:

## 2020-11-15 NOTE — Assessment & Plan Note (Signed)
Given excessive daytime somnolence, narrow pharyngeal exam, witnessed apneas & loud snoring, obstructive sleep apnea is very likely & an overnight polysomnogram will be scheduled as a home study. The pathophysiology of obstructive sleep apnea , it's cardiovascular consequences & modes of treatment including CPAP were discused with the patient in detail & they evidenced understanding.  Pretest probability is high.  She would be willing to use a CPAP if needed.  She thinks she is a mouth breather, I would still start with a nasal cradle mask and if does not work, can proceed to an [place] full facemask

## 2020-11-15 NOTE — Patient Instructions (Signed)
CXR today Schedule home sleep test

## 2020-11-15 NOTE — Assessment & Plan Note (Signed)
No cause identified and this never smoker.  CT abdomen reviewed past shows clear lung fields at the bases, no evidence of ILD. We will proceed with chest x-ray today but may simply be related to deconditioning.  No symptoms to point towards the heart

## 2020-11-18 DIAGNOSIS — J302 Other seasonal allergic rhinitis: Secondary | ICD-10-CM | POA: Diagnosis not present

## 2020-11-18 DIAGNOSIS — Z20822 Contact with and (suspected) exposure to covid-19: Secondary | ICD-10-CM | POA: Diagnosis not present

## 2020-11-20 DIAGNOSIS — Z299 Encounter for prophylactic measures, unspecified: Secondary | ICD-10-CM | POA: Diagnosis not present

## 2020-11-20 DIAGNOSIS — K5010 Crohn's disease of large intestine without complications: Secondary | ICD-10-CM | POA: Diagnosis not present

## 2020-11-20 DIAGNOSIS — I10 Essential (primary) hypertension: Secondary | ICD-10-CM | POA: Diagnosis not present

## 2020-11-20 DIAGNOSIS — N183 Chronic kidney disease, stage 3 unspecified: Secondary | ICD-10-CM | POA: Diagnosis not present

## 2020-11-20 DIAGNOSIS — F331 Major depressive disorder, recurrent, moderate: Secondary | ICD-10-CM | POA: Diagnosis not present

## 2020-11-24 DIAGNOSIS — J069 Acute upper respiratory infection, unspecified: Secondary | ICD-10-CM | POA: Diagnosis not present

## 2020-11-24 DIAGNOSIS — Z299 Encounter for prophylactic measures, unspecified: Secondary | ICD-10-CM | POA: Diagnosis not present

## 2020-11-24 DIAGNOSIS — I10 Essential (primary) hypertension: Secondary | ICD-10-CM | POA: Diagnosis not present

## 2020-11-24 DIAGNOSIS — D692 Other nonthrombocytopenic purpura: Secondary | ICD-10-CM | POA: Diagnosis not present

## 2020-11-30 DIAGNOSIS — Z96651 Presence of right artificial knee joint: Secondary | ICD-10-CM | POA: Diagnosis not present

## 2020-12-04 DIAGNOSIS — I10 Essential (primary) hypertension: Secondary | ICD-10-CM | POA: Diagnosis not present

## 2020-12-04 DIAGNOSIS — E1165 Type 2 diabetes mellitus with hyperglycemia: Secondary | ICD-10-CM | POA: Diagnosis not present

## 2020-12-04 DIAGNOSIS — K219 Gastro-esophageal reflux disease without esophagitis: Secondary | ICD-10-CM | POA: Diagnosis not present

## 2020-12-07 DIAGNOSIS — Z79899 Other long term (current) drug therapy: Secondary | ICD-10-CM | POA: Diagnosis not present

## 2020-12-07 DIAGNOSIS — E7800 Pure hypercholesterolemia, unspecified: Secondary | ICD-10-CM | POA: Diagnosis not present

## 2020-12-07 DIAGNOSIS — E039 Hypothyroidism, unspecified: Secondary | ICD-10-CM | POA: Diagnosis not present

## 2020-12-07 DIAGNOSIS — E559 Vitamin D deficiency, unspecified: Secondary | ICD-10-CM | POA: Diagnosis not present

## 2020-12-07 DIAGNOSIS — I10 Essential (primary) hypertension: Secondary | ICD-10-CM | POA: Diagnosis not present

## 2020-12-07 DIAGNOSIS — Z0000 Encounter for general adult medical examination without abnormal findings: Secondary | ICD-10-CM | POA: Diagnosis not present

## 2020-12-07 DIAGNOSIS — Z7189 Other specified counseling: Secondary | ICD-10-CM | POA: Diagnosis not present

## 2020-12-07 DIAGNOSIS — Z1339 Encounter for screening examination for other mental health and behavioral disorders: Secondary | ICD-10-CM | POA: Diagnosis not present

## 2020-12-07 DIAGNOSIS — R5383 Other fatigue: Secondary | ICD-10-CM | POA: Diagnosis not present

## 2020-12-07 DIAGNOSIS — Z1331 Encounter for screening for depression: Secondary | ICD-10-CM | POA: Diagnosis not present

## 2020-12-07 DIAGNOSIS — Z789 Other specified health status: Secondary | ICD-10-CM | POA: Diagnosis not present

## 2020-12-07 DIAGNOSIS — Z299 Encounter for prophylactic measures, unspecified: Secondary | ICD-10-CM | POA: Diagnosis not present

## 2020-12-07 DIAGNOSIS — Z6831 Body mass index (BMI) 31.0-31.9, adult: Secondary | ICD-10-CM | POA: Diagnosis not present

## 2020-12-15 ENCOUNTER — Telehealth: Payer: Self-pay | Admitting: Pulmonary Disease

## 2020-12-15 NOTE — Telephone Encounter (Signed)
Pt sched for 6/23 @ 8:30 AM in [place]. Nothing further needed at this time.

## 2020-12-20 DIAGNOSIS — Z299 Encounter for prophylactic measures, unspecified: Secondary | ICD-10-CM | POA: Diagnosis not present

## 2020-12-20 DIAGNOSIS — Z789 Other specified health status: Secondary | ICD-10-CM | POA: Diagnosis not present

## 2020-12-20 DIAGNOSIS — E039 Hypothyroidism, unspecified: Secondary | ICD-10-CM | POA: Diagnosis not present

## 2020-12-20 DIAGNOSIS — I10 Essential (primary) hypertension: Secondary | ICD-10-CM | POA: Diagnosis not present

## 2020-12-20 DIAGNOSIS — N183 Chronic kidney disease, stage 3 unspecified: Secondary | ICD-10-CM | POA: Diagnosis not present

## 2020-12-20 DIAGNOSIS — Z6832 Body mass index (BMI) 32.0-32.9, adult: Secondary | ICD-10-CM | POA: Diagnosis not present

## 2020-12-20 DIAGNOSIS — D692 Other nonthrombocytopenic purpura: Secondary | ICD-10-CM | POA: Diagnosis not present

## 2020-12-21 ENCOUNTER — Encounter: Payer: Self-pay | Admitting: Podiatry

## 2020-12-21 ENCOUNTER — Ambulatory Visit (INDEPENDENT_AMBULATORY_CARE_PROVIDER_SITE_OTHER): Payer: Medicare Other | Admitting: Podiatry

## 2020-12-21 ENCOUNTER — Other Ambulatory Visit: Payer: Self-pay

## 2020-12-21 DIAGNOSIS — M7751 Other enthesopathy of right foot: Secondary | ICD-10-CM | POA: Diagnosis not present

## 2020-12-21 DIAGNOSIS — M7752 Other enthesopathy of left foot: Secondary | ICD-10-CM | POA: Diagnosis not present

## 2020-12-21 DIAGNOSIS — G5762 Lesion of plantar nerve, left lower limb: Secondary | ICD-10-CM | POA: Diagnosis not present

## 2020-12-21 DIAGNOSIS — G5782 Other specified mononeuropathies of left lower limb: Secondary | ICD-10-CM

## 2020-12-21 MED ORDER — TRIAMCINOLONE ACETONIDE 40 MG/ML IJ SUSP
60.0000 mg | Freq: Once | INTRAMUSCULAR | Status: AC
  Administered 2020-12-21: 60 mg

## 2020-12-22 ENCOUNTER — Encounter (HOSPITAL_COMMUNITY): Payer: Self-pay | Admitting: Psychiatry

## 2020-12-22 ENCOUNTER — Telehealth (INDEPENDENT_AMBULATORY_CARE_PROVIDER_SITE_OTHER): Payer: Medicare Other | Admitting: Psychiatry

## 2020-12-22 DIAGNOSIS — F331 Major depressive disorder, recurrent, moderate: Secondary | ICD-10-CM

## 2020-12-22 MED ORDER — SERTRALINE HCL 100 MG PO TABS: 150.0000 mg | ORAL_TABLET | Freq: Every day | ORAL | 3 refills | Status: DC

## 2020-12-22 NOTE — Progress Notes (Signed)
Virtual Visit via Telephone Note  I connected with Jessica [name] on 12/22/20 at  9:40 AM EDT by telephone and verified that I am speaking with the correct person using two identifiers.  Location: Patient: home Provider: home office   I discussed the limitations, risks, security and privacy concerns of performing an evaluation and management service by telephone and the availability of in person appointments. I also discussed with the patient that there may be a patient responsible charge related to this service. The patient expressed understanding and agreed to proceed.      I discussed the assessment and treatment plan with the patient. The patient was provided an opportunity to ask questions and all were answered. The patient agreed with the plan and demonstrated an understanding of the instructions.   The patient was advised to call back or seek an in-person evaluation if the symptoms worsen or if the condition fails to improve as anticipated.  I provided 15 minutes of non-face-to-face time during this encounter.   Jessica Spiller, MD  [facility] MD/PA/NP OP Progress Note  12/22/2020 10:01 AM Jessica [name]  MRN:  [id number]  Chief Complaint:  Chief Complaint   Anxiety; Depression; Follow-up    BJS:EGBT patient is a 70 year old married white female who lives with her husband in [place].  Her mother-in-law has also moved in.  She has 2 grown sons.  She retired as a Magazine features editor.  The patient returns for follow-up after about 10 months.  She has missed some appointments.  She states that she has been out of the Zoloft 150 mg for 2 days and she is getting very antsy and irritable.  When she takes that she does pretty well but still has "good and bad days."  She is dealing with chronic neuropathy in her feet as well as significant right knee pain.  She has had total knee replacement in that knee but is looking at a revision fairly soon.  She states that the pain has been difficult for  her.  I offered to increase the Zoloft but she really does not want to right now.  Her sleep is variable because of pain and having to go to the bathroom frequently.  She denies any thoughts of self-harm or suicide.  She does feel somewhat isolated at home because her husband still works in her mother-in-law's lives with them and does not want to go out anywhere Visit Diagnosis:    ICD-10-CM   1. Major depressive disorder, recurrent episode, moderate (HCC)  F33.1 sertraline (ZOLOFT) 100 MG tablet      Past Psychiatric History: Previous outpatient treatment  Past Medical History:  Past Medical History:  Diagnosis Date   Anxiety attack    Arthritis    Back pain, chronic    Crohn's colitis ([place])    Depression (emotion)    Dysrhythmia    GERD (gastroesophageal reflux disease)    Hypertension    Hypothyroid    Neuropathy    Sleep apnea    cpap is not working per patient - needs to find a new doctor - not used since 7/16 per pat    Vertigo    followed by Dr Wardell Heath- in [place]     Past Surgical History:  Procedure Laterality Date   ABDOMINAL HYSTERECTOMY     BIOPSY  06/08/2018   Procedure: BIOPSY;  Surgeon: Rogene Houston, MD;  [facility];  Service: Endoscopy;;  (colon)   CHOLECYSTECTOMY     COLON SURGERY  for Crohn's in the 34s in [place]   COLONOSCOPY N/A 03/11/2013   Procedure: COLONOSCOPY;  Surgeon: Rogene Houston, MD;  Location: [facility];  Service: Endoscopy;  Laterality: N/A;  1200   COLONOSCOPY N/A 06/08/2018   Procedure: COLONOSCOPY;  Surgeon: Rogene Houston, MD;  Location: [facility];  Service: Endoscopy;  Laterality: N/A;  8:25   fatty tumor removed     HARDWARE REMOVAL Right 08/07/2016   Procedure: HARDWARE REMOVAL;  Surgeon: Gaynelle Arabian, MD;  Location: [facility];  Service: Orthopedics;  Laterality: Right;   Hip replacement      Rt hip in 2010 in [place] ARTHROSCOPY Left 03/29/2015   Procedure: ARTHROSCOPY LEFT KNEE WITH MENSICAL  DEBRIDEMENT, chondroplasty;  Surgeon: Gaynelle Arabian, MD;  Location: [facility];  Service: Orthopedics;  Laterality: Left;   ORIF TIBIA PLATEAU Right 02/29/2016   Procedure: OPEN REDUCTION INTERNAL FIXATION (ORIF RIGHT  TIBIAL PLATEAU FRACTURE;  Surgeon: Gaynelle Arabian, MD;  Location: [facility];  Service: Orthopedics;  Laterality: Right;   TOTAL KNEE ARTHROPLASTY Right 08/25/2017   Procedure: TOTAL RIGHT KNEE ARTHROPLASTY;  Surgeon: Gaynelle Arabian, MD;  Location: [facility];  Service: Orthopedics;  Laterality: Right;    Family Psychiatric History: see below  Family History:  Family History  Problem Relation Age of Onset   Crohn's disease Brother    Depression Mother    Anxiety disorder Mother    Depression Sister    Aneurysm Father    Colon cancer Neg Hx     Social History:  Social History   Socioeconomic History   Marital status: Married    Spouse name: Not on file   Number of children: Not on file   Years of education: Not on file   Highest education level: Not on file  Occupational History   Not on file  Tobacco Use   Smoking status: Never   Smokeless tobacco: Never  Vaping Use   Vaping Use: Never used  Substance and Sexual Activity   Alcohol use: No    Alcohol/week: 0.0 standard drinks   Drug use: No   Sexual activity: Not Currently  Other Topics Concern   Not on file  Social History Narrative   Not on file   Social Determinants of Health   Financial Resource Strain: Not on file  Food Insecurity: Not on file  Transportation Needs: Not on file  Physical Activity: Not on file  Stress: Not on file  Social Connections: Not on file    Allergies:  Allergies  Allergen Reactions   Morphine And Related Hives and Itching    May cause blood pressure to drop   Robaxin [Methocarbamol] Itching    (PT WILL RESUME TAKING TO SEE IF THIS IS HER ALLERGY)   Sulfa Antibiotics Itching    ALL SULFA DRUGS    Metabolic Disorder Labs: No results found for: HGBA1C, MPG No results found  for: PROLACTIN No results found for: CHOL, TRIG, HDL, CHOLHDL, VLDL, LDLCALC No results found for: TSH  Therapeutic Level Labs: No results found for: LITHIUM No results found for: VALPROATE No components found for:  CBMZ  Current Medications: Current Outpatient Medications  Medication Sig Dispense Refill   alendronate (FOSAMAX) 70 MG tablet Take 70 mg by mouth once a week. Take with a full glass of water on an empty stomach. (Patient not taking: Reported on 11/15/2020)     amLODipine (NORVASC) 5 MG tablet Take 5 mg by mouth daily.  dicyclomine (BENTYL) 10 MG capsule Take 1 capsule (10 mg total) by mouth 2 (two) times daily before a meal. 60 capsule 5   fluticasone (FLONASE) 50 MCG/ACT nasal spray Place 1 spray into the nose 2 (two) times daily.     gabapentin (NEURONTIN) 300 MG capsule Take 300 mg by mouth 3 (three) times daily as needed.     HYDROcodone-acetaminophen (NORCO/VICODIN) 5-325 MG tablet Take 1 tablet by mouth 2 (two) times daily as needed for moderate pain.     levothyroxine (SYNTHROID) 88 MCG tablet Take 88 mcg by mouth daily before breakfast.     loperamide (IMODIUM) 2 MG capsule Take 1 capsule (2 mg total) by mouth 3 (three) times daily before meals. 90 capsule 2   Multiple Vitamin (MULTIVITAMIN WITH MINERALS) TABS tablet Take 1 tablet by mouth daily.     nystatin-triamcinolone (MYCOLOG II) cream Apply 1 application topically 2 (two) times daily. 30 g 1   Omega-3 Fatty Acids (FISH OIL) 1000 MG CPDR Take by mouth daily.     pantoprazole (PROTONIX) 40 MG tablet Take 1 tablet (40 mg total) by mouth daily before breakfast. 90 tablet 3   Red Yeast Rice Extract (RED YEAST RICE PO) Take by mouth in the morning and at bedtime.     sertraline (ZOLOFT) 100 MG tablet Take 1.5 tablets (150 mg total) by mouth daily. 135 tablet 3   Vitamin D, Ergocalciferol, (DRISDOL) 1.25 MG (50000 UNIT) CAPS capsule TAKE 1 CAPSULE BY MOUTH TWICE A WEEK FOR 4 MONTHS THEN REDUCE TO ONCE WEEKLY     No  current facility-administered medications for this visit.     Musculoskeletal: Strength & Muscle Tone: within normal limits Gait & Station: normal Patient leans: N/A  Psychiatric Specialty Exam: Review of Systems  HENT:  Positive for hearing loss.   Musculoskeletal:  Positive for arthralgias and joint swelling.  Psychiatric/Behavioral:  Positive for sleep disturbance.   All other systems reviewed and are negative.  There were no vitals taken for this visit.There is no height or weight on file to calculate BMI.  General Appearance: NA  Eye Contact:  NA  Speech:  Clear and Coherent  Volume:  Normal  Mood:  Anxious  Affect:  NA  Thought Process:  Goal Directed  Orientation:  Full (Time, Place, and Person)  Thought Content: Rumination   Suicidal Thoughts:  No  Homicidal Thoughts:  No  Memory:  Immediate;   Good Recent;   Good Remote;   Fair  Judgement:  Good  Insight:  Fair  Psychomotor Activity:  Decreased  Concentration:  Concentration: Good and Attention Span: Good  Recall:  Good  Fund of Knowledge: Good  Language: Good  Akathisia:  No  Handed:  Right  AIMS (if indicated): not done  Assets:  Communication Skills Desire for Improvement Resilience Social Support Talents/Skills  ADL's:  Intact  Cognition: WNL  Sleep:  Fair   Screenings: PHQ2-9    Flowsheet Row Video Visit from 12/22/2020 in [facility]  PHQ-2 Total Score 2  PHQ-9 Total Score 6      Flowsheet Row Video Visit from 12/22/2020 in [facility] No Risk        Assessment and Plan: This patient is a 70 year old female with a history of depression and anxiety.  She has been more anxious lately but does not want to change her medication until after the knee surgery.  For now she will continue Zoloft  150 mg daily.  She is not a risk of self-harm or suicide.  She will return to see me in 2  months   Jessica Spiller, MD 12/22/2020, 10:01 AM

## 2020-12-23 NOTE — Progress Notes (Signed)
She presents today after having not seen her since November states that her ankles are starting to bother her again.  States that she is having itching in her arch but feels that it may be of the neuropathy.  She is also having pain to the forefoot third left.  Objective: Vital signs are stable she is alert and oriented x3 pulses are palpable.  She has a palpable Mulder's click third interdigital space of the left foot.  She also has pain on palpation and end range of motion of the subtalar joint and sinus tarsi bilaterally.  Cutaneous evaluation does not demonstrate any type of rash or swelling erythema or edema to the medial longitudinal arch bilaterally.  Assessment: Most likely neuropathic changes medial longitudinal arch resulting in her itching.  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  Plan: Discussed etiology pathology and surgical therapies injected the subtalar joint today bilaterally 10 mg Kenalog 5 mg Marcaine point of maximal tenderness.  I went ahead and injected her third interdigital space today as well with 10 mg Kenalog 5 mg Marcaine.  Tolerated procedure well follow-up with her in 1 month or as needed.

## 2020-12-28 ENCOUNTER — Other Ambulatory Visit: Payer: Self-pay

## 2020-12-28 ENCOUNTER — Ambulatory Visit: Payer: Medicare Other

## 2020-12-28 DIAGNOSIS — G4733 Obstructive sleep apnea (adult) (pediatric): Secondary | ICD-10-CM

## 2020-12-28 DIAGNOSIS — R0600 Dyspnea, unspecified: Secondary | ICD-10-CM

## 2021-01-01 ENCOUNTER — Telehealth (HOSPITAL_COMMUNITY): Payer: Self-pay | Admitting: *Deleted

## 2021-01-01 NOTE — Telephone Encounter (Signed)
Patient called and stated she lost her medication. Per pt she do not know where she dropped it and would like for provider to please approve another fill with her pharmacy [facility] in [place].   Staff called patient back due to not leaving the name of the medication on the voicemail and message stated call can not be completed as dial. Staff tried 3 times with the number on file and message states the same thing

## 2021-01-03 ENCOUNTER — Other Ambulatory Visit: Payer: Self-pay

## 2021-01-03 ENCOUNTER — Ambulatory Visit: Payer: Medicare Other

## 2021-01-03 DIAGNOSIS — G4733 Obstructive sleep apnea (adult) (pediatric): Secondary | ICD-10-CM | POA: Diagnosis not present

## 2021-01-09 DIAGNOSIS — G5603 Carpal tunnel syndrome, bilateral upper limbs: Secondary | ICD-10-CM | POA: Diagnosis not present

## 2021-01-09 DIAGNOSIS — R202 Paresthesia of skin: Secondary | ICD-10-CM | POA: Diagnosis not present

## 2021-01-09 DIAGNOSIS — M542 Cervicalgia: Secondary | ICD-10-CM | POA: Diagnosis not present

## 2021-01-09 DIAGNOSIS — G2581 Restless legs syndrome: Secondary | ICD-10-CM | POA: Diagnosis not present

## 2021-01-09 DIAGNOSIS — M545 Low back pain, unspecified: Secondary | ICD-10-CM | POA: Diagnosis not present

## 2021-01-09 DIAGNOSIS — Z79899 Other long term (current) drug therapy: Secondary | ICD-10-CM | POA: Diagnosis not present

## 2021-01-10 ENCOUNTER — Telehealth: Payer: Self-pay | Admitting: Pulmonary Disease

## 2021-01-10 DIAGNOSIS — G4733 Obstructive sleep apnea (adult) (pediatric): Secondary | ICD-10-CM | POA: Diagnosis not present

## 2021-01-10 NOTE — Telephone Encounter (Signed)
HST showed mod  OSA with AHI 28/ hr Suggest autoCPAP  5-15 cm, mask of choice -can start with nasal OV with me/APP in 6 wks after starting

## 2021-01-11 NOTE — Telephone Encounter (Signed)
Spoke with the pt and notified of results of sleep study. Pt verbalized understanding and was agreeable to CPAP therapy. I have placed DME referral for this and pt aware to contact the office for 31-90 day f/u once they begin using machine per insurance requirement.

## 2021-01-13 DIAGNOSIS — Z20822 Contact with and (suspected) exposure to covid-19: Secondary | ICD-10-CM | POA: Diagnosis not present

## 2021-01-18 DIAGNOSIS — E211 Secondary hyperparathyroidism, not elsewhere classified: Secondary | ICD-10-CM | POA: Diagnosis not present

## 2021-01-18 DIAGNOSIS — I129 Hypertensive chronic kidney disease with stage 1 through stage 4 chronic kidney disease, or unspecified chronic kidney disease: Secondary | ICD-10-CM | POA: Diagnosis not present

## 2021-01-18 DIAGNOSIS — N1831 Chronic kidney disease, stage 3a: Secondary | ICD-10-CM | POA: Diagnosis not present

## 2021-01-18 DIAGNOSIS — R768 Other specified abnormal immunological findings in serum: Secondary | ICD-10-CM | POA: Diagnosis not present

## 2021-01-18 DIAGNOSIS — E870 Hyperosmolality and hypernatremia: Secondary | ICD-10-CM | POA: Diagnosis not present

## 2021-01-23 DIAGNOSIS — Z299 Encounter for prophylactic measures, unspecified: Secondary | ICD-10-CM | POA: Diagnosis not present

## 2021-01-23 DIAGNOSIS — E039 Hypothyroidism, unspecified: Secondary | ICD-10-CM | POA: Diagnosis not present

## 2021-01-23 DIAGNOSIS — Z6831 Body mass index (BMI) 31.0-31.9, adult: Secondary | ICD-10-CM | POA: Diagnosis not present

## 2021-01-23 DIAGNOSIS — N183 Chronic kidney disease, stage 3 unspecified: Secondary | ICD-10-CM | POA: Diagnosis not present

## 2021-01-23 DIAGNOSIS — I10 Essential (primary) hypertension: Secondary | ICD-10-CM | POA: Diagnosis not present

## 2021-01-23 DIAGNOSIS — Z789 Other specified health status: Secondary | ICD-10-CM | POA: Diagnosis not present

## 2021-01-30 ENCOUNTER — Encounter (INDEPENDENT_AMBULATORY_CARE_PROVIDER_SITE_OTHER): Payer: Self-pay | Admitting: Internal Medicine

## 2021-01-30 ENCOUNTER — Ambulatory Visit (INDEPENDENT_AMBULATORY_CARE_PROVIDER_SITE_OTHER): Payer: Medicare Other | Admitting: Internal Medicine

## 2021-01-30 ENCOUNTER — Other Ambulatory Visit: Payer: Self-pay

## 2021-01-30 VITALS — BP 157/74 | HR 84 | Temp 98.5°F | Ht 62.0 in | Wt 166.0 lb

## 2021-01-30 DIAGNOSIS — K219 Gastro-esophageal reflux disease without esophagitis: Secondary | ICD-10-CM

## 2021-01-30 DIAGNOSIS — K529 Noninfective gastroenteritis and colitis, unspecified: Secondary | ICD-10-CM

## 2021-01-30 DIAGNOSIS — K5000 Crohn's disease of small intestine without complications: Secondary | ICD-10-CM

## 2021-01-30 NOTE — Patient Instructions (Signed)
Will consider dropping pantoprazole dose to 20 mg daily after you have fully recovered from knee replacement. Please check with Dr. Woody Seller when you are due for next bone density study. Will request copy of recent blood work for review.

## 2021-01-30 NOTE — Progress Notes (Addendum)
Presenting complaint;  Follow for chronic GERD/diarrhea and history of Crohn's disease.  Database and subjective:  Patient is 70 year old Caucasian female who has history of small bowel Crohn's disease with remote right hemicolectomy who has remained in remission without any therapy.  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  Biopsies from anastomosis revealed no evidence of active disease.  She also has a history of chronic diarrhea felt to be due to bowel surgery and she may also have an IBS.  Finally she also has chronic GERD. Her last visit was virtual visit in January 2022.  She states she is doing very well.  Heartburn is well controlled with pantoprazole.  She is willing to drop dose in future because of history of osteopenia.  She is not sure when she is due for her next bone density study.  She denies dysphagia hoarseness sore throat or chronic cough.  She says she occasionally has diarrhea and accident but on most days she has 2 formed stools.  She is not have any side effects with dicyclomine.  She denies melena or rectal bleeding.  Her appetite is very good.  She has lost 4 pounds since her last visit and she is trying to lose more prior to her knee surgery.  She is scheduled for right knee replacement on 03/28/2021.   Current Medications: Outpatient Encounter Medications as of 01/30/2021  Medication Sig   amLODipine (NORVASC) 5 MG tablet Take 5 mg by mouth daily.   dicyclomine (BENTYL) 10 MG capsule Take 1 capsule (10 mg total) by mouth 2 (two) times daily before a meal.   fluticasone (FLONASE) 50 MCG/ACT nasal spray Place 1 spray into the nose 2 (two) times daily.   gabapentin (NEURONTIN) 300 MG capsule Take 300 mg by mouth 3 (three) times daily as needed.   hydrochlorothiazide (HYDRODIURIL) 12.5 MG tablet Take 1 tablet by mouth daily.   HYDROcodone-acetaminophen (NORCO/VICODIN) 5-325 MG tablet Take 1 tablet  by mouth 2 (two) times daily as needed for moderate pain.   Multiple Vitamin (MULTIVITAMIN WITH MINERALS) TABS tablet Take 1 tablet by mouth daily.   nystatin-triamcinolone (MYCOLOG II) cream Apply 1 application topically 2 (two) times daily.   pantoprazole (PROTONIX) 40 MG tablet Take 1 tablet (40 mg total) by mouth daily before breakfast.   rosuvastatin (CRESTOR) 5 MG tablet Take 5 mg by mouth daily.   sertraline (ZOLOFT) 100 MG tablet Take 1.5 tablets (150 mg total) by mouth daily.   [DISCONTINUED] amLODipine (NORVASC) 5 MG tablet Take 5 mg by mouth daily.   [DISCONTINUED] levothyroxine (SYNTHROID) 88 MCG tablet Take 88 mcg by mouth daily before breakfast.   [DISCONTINUED] loperamide (IMODIUM) 2 MG capsule Take 1 capsule (2 mg total) by mouth 3 (three) times daily before meals.   [DISCONTINUED] Omega-3 Fatty Acids (FISH OIL) 1000 MG CPDR Take by mouth daily.   Vitamin D, Ergocalciferol, (DRISDOL) 1.25 MG (50000 UNIT) CAPS capsule TAKE 1 CAPSULE BY MOUTH TWICE A WEEK FOR 4 MONTHS THEN REDUCE TO ONCE WEEKLY (Patient not taking: Reported on 01/30/2021)   [DISCONTINUED] alendronate (FOSAMAX) 70 MG tablet Take 70 mg by mouth once a week. Take with a full glass of water on an empty stomach. (Patient not taking: Reported on 11/15/2020)   [DISCONTINUED] Red Yeast Rice Extract (RED YEAST RICE PO) Take by mouth in the morning and at bedtime.   No facility-administered encounter medications on file as of 01/30/2021.  Objective: Blood pressure (!) 157/74, pulse 84, temperature 98.5 F (36.9 C), temperature source Oral, height 5' 2" (1.575 m), weight 166 lb (75.3 kg). Patient is alert and in no acute distress. Patient is wearing a mask. Conjunctiva is pink. Sclera is nonicteric Oropharyngeal mucosa is normal. No neck masses or thyromegaly noted. Cardiac exam with regular rhythm normal S1 and S2. No murmur or gallop noted. Lungs are clear to auscultation. Abdomen abdomen is symmetrical.  She has  right subcostal and vertical midline scars.  On palpation abdomen is soft and nontender with organomegaly or masses. No LE edema or clubbing noted. Distal phalanx of left fourth finger has been amputated.  Labs/studies Results:   CBC Latest Ref Rng & Units 02/17/2019 08/28/2017 08/27/2017  WBC 3.8 - 10.8 Thousand/uL 4.6 6.0 7.5  Hemoglobin 11.7 - 15.5 g/dL 11.9 8.9(L) 9.3(L)  Hematocrit 35.0 - 45.0 % 35.7 26.2(L) 26.9(L)  Platelets 140 - 400 Thousand/uL 136(L) 147(L) 148(L)    CMP Latest Ref Rng & Units 05/10/2019 02/17/2019 08/27/2017  Glucose 65 - 139 mg/dL - 103 92  BUN 7 - 25 mg/dL - 18 17  Creatinine 0.44 - 1.00 mg/dL 1.10(H) 1.27(H) 1.03(H)  Sodium 135 - 146 mmol/L - 142 139  Potassium 3.5 - 5.3 mmol/L - 3.9 4.5  Chloride 98 - 110 mmol/L - 109 109  CO2 20 - 32 mmol/L - 24 23  Calcium 8.6 - 10.4 mg/dL - 9.4 8.7(L)  Total Protein 6.1 - 8.1 g/dL - 6.9 -  Total Bilirubin 0.2 - 1.2 mg/dL - 0.5 -  Alkaline Phos 38 - 126 U/L - - -  AST 10 - 35 U/L - 21 -  ALT 6 - 29 U/L - 10 -    Hepatic Function Latest Ref Rng & Units 02/17/2019 08/20/2017 09/03/2016  Total Protein 6.1 - 8.1 g/dL 6.9 7.8 7.0  Albumin 3.5 - 5.0 g/dL - 4.3 4.0  AST 10 - 35 U/L _0 ALT 6 - 29 U/L 10 12(L) 11(L)  Alk Phosphatase 38 - 126 U/L - 122 112  Total Bilirubin 0.2 - 1.2 mg/dL 0.5 0.8 0.7    Lab Results  Component Value Date   CRP 0.7 02/17/2019    No recent lab on file.  Assessment:  #1.  History of small bowel Crohn's disease.  She had right hemicolectomy several years ago.  She remains in remission.  She had colonoscopy in December 2019 which revealed pinhole size fistula which appears to be in significant clinically.  MR enterography in November 2020 did not reveal changes to suggest active disease.  #2.  Chronic diarrhea.  Diarrhea appears to be due to IBS and loss of ileocecal valve.  She is doing very well with therapy.  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  #3.  Chronic GERD.  She is doing well with daily pantoprazole at a dose of 40 mg daily.  Since she has osteopenia PPI may be enhancing bone loss.  Would consider dropping PPI dose once she has fully recovered from knee surgery.  In the meantime she will find out when she is due for bone density study.  Plan:  Continue pantoprazole at current dose of 40 mg p.o. every morning.  Would consider dropping dose to 20 mg daily when she is fully recovered from planned knee surgery to be performed on 03/08/2021. Request lab from Dr. Trena Platt office. Patient advised to talk with Dr.Shah as to the timing  of her next bone density study. Office visit in 1 year.   Addendum:  Lab data from 12/07/2020  WBC 6.1 H/H 13/39.1 Plt 169K  TSH 8.960 Reviewed with patient.

## 2021-02-01 ENCOUNTER — Other Ambulatory Visit (INDEPENDENT_AMBULATORY_CARE_PROVIDER_SITE_OTHER): Payer: Self-pay | Admitting: Internal Medicine

## 2021-02-02 DIAGNOSIS — R002 Palpitations: Secondary | ICD-10-CM | POA: Diagnosis not present

## 2021-02-02 DIAGNOSIS — Z299 Encounter for prophylactic measures, unspecified: Secondary | ICD-10-CM | POA: Diagnosis not present

## 2021-02-02 DIAGNOSIS — I10 Essential (primary) hypertension: Secondary | ICD-10-CM | POA: Diagnosis not present

## 2021-02-02 NOTE — Telephone Encounter (Signed)
When pt was seen this week she told me she was not taking med and I took off med list and now we have a request for refill. Want to clarify with pt if she is taking med

## 2021-02-02 NOTE — Telephone Encounter (Signed)
Pt states she is taking this med and also needs refill on diycylomine 79m one bid

## 2021-02-02 NOTE — Telephone Encounter (Signed)
Left message to return call 

## 2021-02-05 NOTE — Telephone Encounter (Signed)
Patient called back today asking about the medication I advised that Dr. Laural Golden would be back on 02/06/2021 we would address with him at that time, if needed prior to this she may want to buy otc. Patient states understanding.

## 2021-02-06 ENCOUNTER — Other Ambulatory Visit (INDEPENDENT_AMBULATORY_CARE_PROVIDER_SITE_OTHER): Payer: Self-pay | Admitting: Internal Medicine

## 2021-02-06 MED ORDER — DICYCLOMINE HCL 10 MG PO CAPS: 10.0000 mg | ORAL_CAPSULE | Freq: Two times a day (BID) | ORAL | 5 refills | Status: DC

## 2021-02-06 MED ORDER — LOPERAMIDE HCL 2 MG PO CAPS: 2.0000 mg | ORAL_CAPSULE | Freq: Two times a day (BID) | ORAL | 0 refills | Status: DC | PRN

## 2021-02-08 ENCOUNTER — Encounter (INDEPENDENT_AMBULATORY_CARE_PROVIDER_SITE_OTHER): Payer: Self-pay

## 2021-02-21 DIAGNOSIS — L905 Scar conditions and fibrosis of skin: Secondary | ICD-10-CM | POA: Diagnosis not present

## 2021-02-21 DIAGNOSIS — L281 Prurigo nodularis: Secondary | ICD-10-CM | POA: Diagnosis not present

## 2021-02-21 DIAGNOSIS — L570 Actinic keratosis: Secondary | ICD-10-CM | POA: Diagnosis not present

## 2021-02-21 DIAGNOSIS — D485 Neoplasm of uncertain behavior of skin: Secondary | ICD-10-CM | POA: Diagnosis not present

## 2021-02-21 DIAGNOSIS — L280 Lichen simplex chronicus: Secondary | ICD-10-CM | POA: Diagnosis not present

## 2021-03-01 DIAGNOSIS — Z299 Encounter for prophylactic measures, unspecified: Secondary | ICD-10-CM | POA: Diagnosis not present

## 2021-03-01 DIAGNOSIS — I10 Essential (primary) hypertension: Secondary | ICD-10-CM | POA: Diagnosis not present

## 2021-03-01 DIAGNOSIS — Z79899 Other long term (current) drug therapy: Secondary | ICD-10-CM | POA: Diagnosis not present

## 2021-03-01 DIAGNOSIS — R0609 Other forms of dyspnea: Secondary | ICD-10-CM | POA: Diagnosis not present

## 2021-03-01 DIAGNOSIS — F331 Major depressive disorder, recurrent, moderate: Secondary | ICD-10-CM | POA: Diagnosis not present

## 2021-03-01 DIAGNOSIS — K5010 Crohn's disease of large intestine without complications: Secondary | ICD-10-CM | POA: Diagnosis not present

## 2021-03-01 DIAGNOSIS — R5383 Other fatigue: Secondary | ICD-10-CM | POA: Diagnosis not present

## 2021-03-05 DIAGNOSIS — R0602 Shortness of breath: Secondary | ICD-10-CM | POA: Diagnosis not present

## 2021-03-07 DIAGNOSIS — K219 Gastro-esophageal reflux disease without esophagitis: Secondary | ICD-10-CM | POA: Diagnosis not present

## 2021-03-07 DIAGNOSIS — I10 Essential (primary) hypertension: Secondary | ICD-10-CM | POA: Diagnosis not present

## 2021-03-07 DIAGNOSIS — E1165 Type 2 diabetes mellitus with hyperglycemia: Secondary | ICD-10-CM | POA: Diagnosis not present

## 2021-03-13 ENCOUNTER — Encounter (HOSPITAL_COMMUNITY): Payer: Medicare Other

## 2021-03-14 DIAGNOSIS — Z299 Encounter for prophylactic measures, unspecified: Secondary | ICD-10-CM | POA: Diagnosis not present

## 2021-03-14 DIAGNOSIS — I10 Essential (primary) hypertension: Secondary | ICD-10-CM | POA: Diagnosis not present

## 2021-03-14 DIAGNOSIS — I340 Nonrheumatic mitral (valve) insufficiency: Secondary | ICD-10-CM | POA: Diagnosis not present

## 2021-03-14 DIAGNOSIS — Z23 Encounter for immunization: Secondary | ICD-10-CM | POA: Diagnosis not present

## 2021-03-19 ENCOUNTER — Encounter: Payer: Self-pay | Admitting: *Deleted

## 2021-03-19 ENCOUNTER — Ambulatory Visit (INDEPENDENT_AMBULATORY_CARE_PROVIDER_SITE_OTHER): Payer: Medicare Other | Admitting: Cardiology

## 2021-03-19 ENCOUNTER — Encounter: Payer: Self-pay | Admitting: Cardiology

## 2021-03-19 VITALS — BP 118/68 | HR 72 | Ht 62.0 in | Wt 163.6 lb

## 2021-03-19 DIAGNOSIS — I340 Nonrheumatic mitral (valve) insufficiency: Secondary | ICD-10-CM

## 2021-03-19 NOTE — Patient Instructions (Addendum)
Medication Instructions:  Continue all current medications.  Labwork: none  Testing/Procedures: Your physician has requested that you have a TEE. During a TEE, sound waves are used to create images of your heart. It provides your doctor with information about the size and shape of your heart and how well your heart's chambers and valves are working. In this test, a transducer is attached to the end of a flexible tube that's guided down your throat and into your esophagus (the tube leading from you mouth to your stomach) to get a more detailed image of your heart. You are not awake for the procedure. Please see the instruction sheet given to you today. For further information please visit [email or web address].  Follow-Up: 1 months   Any Other Special Instructions Will Be Listed Below (If Applicable).   If you need a refill on your cardiac medications before your next appointment, please call your pharmacy.

## 2021-03-19 NOTE — Progress Notes (Signed)
Clinical Summary Jessica [name] is a 70 y.o.female seen today as a new consult. Referred by Dr Woody Seller for the following medical problems.   Mitral regurgitation - severe MR from pcp note  02/2021 echo at [facility]: LVEF >55%, severe MR. LVIDs 2.6   - SOB for last months. SOB at rest or with activities. DOE with just walking room to room home. Denies any recent edema.    2.OSA - followed by Dr Elsworth Soho   3. CKD - followed by Dr Theador Hawthorne Past Medical History:  Diagnosis Date   Anxiety attack    Arthritis    Back pain, chronic    Crohn's colitis ([place])    Depression (emotion)    Dysrhythmia    GERD (gastroesophageal reflux disease)    Hypertension    Hypothyroid    Neuropathy    Sleep apnea    cpap is not working per patient - needs to find a new doctor - not used since 7/16 per pat    Vertigo    followed by Dr Wardell Heath- in [place]      Allergies  Allergen Reactions   Morphine And Related Hives and Itching    May cause blood pressure to drop   Robaxin [Methocarbamol] Itching    (PT WILL RESUME TAKING TO SEE IF THIS IS HER ALLERGY)   Sulfa Antibiotics Itching    ALL SULFA DRUGS     Current Outpatient Medications  Medication Sig Dispense Refill   amLODipine (NORVASC) 5 MG tablet Take 5 mg by mouth daily.     dicyclomine (BENTYL) 10 MG capsule Take 1 capsule (10 mg total) by mouth 2 (two) times daily before a meal. 60 capsule 5   fluticasone (FLONASE) 50 MCG/ACT nasal spray Place 1 spray into the nose 2 (two) times daily.     gabapentin (NEURONTIN) 300 MG capsule Take 300 mg by mouth 3 (three) times daily as needed.     hydrochlorothiazide (HYDRODIURIL) 12.5 MG tablet Take 1 tablet by mouth daily.     HYDROcodone-acetaminophen (NORCO/VICODIN) 5-325 MG tablet Take 1 tablet by mouth 2 (two) times daily as needed for moderate pain.     levothyroxine (SYNTHROID) 100 MCG tablet Take 100 mcg by mouth daily before breakfast.     loperamide (IMODIUM) 2 MG capsule Take 1 capsule  (2 mg total) by mouth 2 (two) times daily as needed for diarrhea or loose stools. 180 capsule 0   Multiple Vitamin (MULTIVITAMIN WITH MINERALS) TABS tablet Take 1 tablet by mouth daily.     nystatin-triamcinolone (MYCOLOG II) cream Apply 1 application topically 2 (two) times daily. 30 g 1   pantoprazole (PROTONIX) 40 MG tablet Take 1 tablet (40 mg total) by mouth daily before breakfast. 90 tablet 3   rosuvastatin (CRESTOR) 5 MG tablet Take 5 mg by mouth daily.     sertraline (ZOLOFT) 100 MG tablet Take 1.5 tablets (150 mg total) by mouth daily. 135 tablet 3   Vitamin D, Ergocalciferol, (DRISDOL) 1.25 MG (50000 UNIT) CAPS capsule TAKE 1 CAPSULE BY MOUTH TWICE A WEEK FOR 4 MONTHS THEN REDUCE TO ONCE WEEKLY (Patient not taking: Reported on 01/30/2021)     No current facility-administered medications for this visit.     Past Surgical History:  Procedure Laterality Date   ABDOMINAL HYSTERECTOMY     BIOPSY  06/08/2018   Procedure: BIOPSY;  Surgeon: Rogene Houston, MD;  [facility];  Service: Endoscopy;;  (colon)   CHOLECYSTECTOMY  COLON SURGERY     for Crohn's in the 73s in [place]   COLONOSCOPY N/A 03/11/2013   Procedure: COLONOSCOPY;  Surgeon: Rogene Houston, MD;  [facility];  Service: Endoscopy;  Laterality: N/A;  1200   COLONOSCOPY N/A 06/08/2018   Procedure: COLONOSCOPY;  Surgeon: Rogene Houston, MD;  [facility];  Service: Endoscopy;  Laterality: N/A;  8:25   fatty tumor removed     HARDWARE REMOVAL Right 08/07/2016   Procedure: HARDWARE REMOVAL;  Surgeon: Gaynelle Arabian, MD;  Location: [facility];  Service: Orthopedics;  Laterality: Right;   Hip replacement      Rt hip in 2010 in [place] ARTHROSCOPY Left 03/29/2015   Procedure: ARTHROSCOPY LEFT KNEE WITH MENSICAL DEBRIDEMENT, chondroplasty;  Surgeon: Gaynelle Arabian, MD;  Location: [facility];  Service: Orthopedics;  Laterality: Left;   ORIF TIBIA PLATEAU Right 02/29/2016   Procedure: OPEN  REDUCTION INTERNAL FIXATION (ORIF RIGHT  TIBIAL PLATEAU FRACTURE;  Surgeon: Gaynelle Arabian, MD;  [facility]: [facility];  Service: Orthopedics;  Laterality: Right;   TOTAL KNEE ARTHROPLASTY Right 08/25/2017   Procedure: TOTAL RIGHT KNEE ARTHROPLASTY;  Surgeon: Gaynelle Arabian, MD;  [facility];  Service: Orthopedics;  Laterality: Right;     Allergies  Allergen Reactions   Morphine And Related Hives and Itching    May cause blood pressure to drop   Robaxin [Methocarbamol] Itching    (PT WILL RESUME TAKING TO SEE IF THIS IS HER ALLERGY)   Sulfa Antibiotics Itching    ALL SULFA DRUGS      Family History  Problem Relation Age of Onset   Crohn's disease Brother    Depression Mother    Anxiety disorder Mother    Depression Sister    Aneurysm Father    Colon cancer Neg Hx      Social History Jessica [name] reports that she has never smoked. She has never used smokeless tobacco. Jessica [name] reports no history of alcohol use.   Review of Systems CONSTITUTIONAL: No weight loss, fever, chills, weakness or fatigue.  HEENT: Eyes: No visual loss, blurred vision, double vision or yellow sclerae.No hearing loss, sneezing, congestion, runny nose or sore throat.  SKIN: No rash or itching.  CARDIOVASCULAR: per hpi RESPIRATORY: No shortness of breath, cough or sputum.  GASTROINTESTINAL: No anorexia, nausea, vomiting or diarrhea. No abdominal pain or blood.  GENITOURINARY: No burning on urination, no polyuria NEUROLOGICAL: No headache, dizziness, syncope, paralysis, ataxia, numbness or tingling in the extremities. No change in bowel or bladder control.  MUSCULOSKELETAL: No muscle, back pain, joint pain or stiffness.  LYMPHATICS: No enlarged nodes. No history of splenectomy.  PSYCHIATRIC: No history of depression or anxiety.  ENDOCRINOLOGIC: No reports of sweating, cold or heat intolerance. No polyuria or polydipsia.  Marland Kitchen   Physical Examination Today's Vitals   03/19/21 1037  BP: 118/68  Pulse:  72  SpO2: 94%  Weight: 163 lb 9.6 oz (74.2 kg)  Height: 5' 2"  (1.575 m)   Body mass index is 29.92 kg/m.  Gen: resting comfortably, no acute distress HEENT: no scleral icterus, pupils equal round and reactive, no palptable cervical adenopathy,  CV: RRR, 3/6 systolc murmur apex, no jvd Resp: Clear to auscultation bilaterally GI: abdomen is soft, non-tender, non-distended, normal bowel sounds, no hepatosplenomegaly MSK: extremities are warm, no edema.  Skin: warm, no rash Neuro:  no focal deficits Psych: appropriate affect     Assessment and Plan  1.Mitral regurgitation - report from  pcp office indicates severe MR, normal LVEF, LVIDs 2.6 - reports some DOE that may be related, a lot of generalized fatigue that typically don't see with valvular disease. Appears euvoelmic, weight is actually down 7 lbs since 11/15/20. Given degree of reported symptoms would suspect some evidence of HF if valve was primary etiology but don't really see signs of fluid overload - plan for TEE to further evaluate valve  EKG shows NSR    Arnoldo Lenis, M.D.

## 2021-03-20 DIAGNOSIS — G2581 Restless legs syndrome: Secondary | ICD-10-CM | POA: Diagnosis not present

## 2021-03-20 DIAGNOSIS — G603 Idiopathic progressive neuropathy: Secondary | ICD-10-CM | POA: Diagnosis not present

## 2021-03-20 DIAGNOSIS — M5441 Lumbago with sciatica, right side: Secondary | ICD-10-CM | POA: Diagnosis not present

## 2021-03-20 DIAGNOSIS — M5442 Lumbago with sciatica, left side: Secondary | ICD-10-CM | POA: Diagnosis not present

## 2021-03-20 DIAGNOSIS — M5412 Radiculopathy, cervical region: Secondary | ICD-10-CM | POA: Diagnosis not present

## 2021-03-20 DIAGNOSIS — Z79899 Other long term (current) drug therapy: Secondary | ICD-10-CM | POA: Diagnosis not present

## 2021-03-20 NOTE — Patient Instructions (Signed)
Jessica [name]  03/20/2021     @PREFPERIOPPHARMACY @   Your procedure is scheduled on 03/27/2021.   Report to [facility] at  0800 A.M.   Call this number if you have problems the morning of surgery:  [phone]   Remember:  Do not eat or drink after midnight.      Take these medicines the morning of surgery with A SIP OF WATER        amlodipine, hydrocodone(if needed), levothyroxine, metoprolol, protonix, requip, zoloft.     Do not wear jewelry, make-up or nail polish.  Do not wear lotions, powders, or perfumes, or deodorant.  Do not shave 48 hours prior to surgery.  Men may shave face and neck.  Do not bring valuables to the hospital.  [facility] is not responsible for any belongings or valuables.  Contacts, dentures or bridgework may not be worn into surgery.  Leave your suitcase in the car.  After surgery it may be brought to your room.  For patients admitted to the hospital, discharge time will be determined by your treatment team.  Patients discharged the day of surgery will not be allowed to drive home and must have someone with them for 24 hours.    Special instructions:   DO NOT smoke tobacco or vape fore 24 hours before your procedure.  Please read over the following fact sheets that you were given. Anesthesia Post-op Instructions and Care and Recovery After Surgery       Transesophageal Echocardiogram Transesophageal echocardiogram (TEE) is a test that uses sound waves to take pictures of your heart. TEE is done by passing a small probe attached to a flexible tube down the part of the body that moves food from your mouth to your stomach (esophagus). The pictures give clear images of your heart. This can help your doctor see if there are problems with your heart. Tell a doctor about: Any allergies you have. All medicines you are taking. This includes vitamins, herbs, eye drops, creams, and over-the-counter medicines. Any problems you or family members  have had with anesthetic medicines. Any blood disorders you have. Any surgeries you have had. Any medical conditions you have. Any swallowing problems. Whether you have or have had a blockage in the part of the body that moves food from your mouth to your stomach. Whether you are pregnant or may be pregnant. What are the risks? In general, this is a safe procedure. But, problems may occur, such as: Damage to nearby structures or organs. A tear in the part of the body that moves food from your mouth to your stomach. Irregular heartbeat. Hoarse voice or trouble swallowing. Bleeding. What happens before the procedure? Medicines Ask your doctor about changing or stopping: Your normal medicines. Vitamins, herbs, and supplements. Over-the-counter medicines. Do not take aspirin or ibuprofen unless you are told to. General instructions Follow instructions from your doctor about what you cannot eat or drink. You will take out any dentures or dental retainers. Plan to have a responsible adult take you home from the hospital or clinic. Plan to have a responsible adult care for you for the time you are told after you leave the hospital or clinic. This is important. What happens during the procedure?  An IV will be put into one of your veins. You may be given: A sedative. This medicine helps you relax. A medicine to numb the back of your throat. This may be sprayed or gargled. Your blood pressure,  heart rate, and breathing will be watched. You may be asked to lie on your left side. A bite block will be placed in your mouth. This keeps you from biting the tube. The tip of the probe will be placed into the back of your mouth. You will be asked to swallow. Your doctor will take pictures of your heart. The probe and bite block will be taken out after the test is done. The procedure may vary among doctors and hospitals. What can I expect after the procedure? You will be monitored until you  leave the hospital or clinic. This includes checking your blood pressure, heart rate, breathing rate, and blood oxygen level. Your throat may feel sore and numb. This will get better over time. You will not be allowed to eat or drink until the numbness has gone away. It is common to have a sore throat for a day or two. It is up to you to get the results of your procedure. Ask how to get your results when they are ready. Follow these instructions at home: If you were given a sedative during your procedure, do not drive or use machines until your doctor says that it is safe. Return to your normal activities when your doctor says that it is safe. Keep all follow-up visits. Summary TEE is a test that uses sound waves to take pictures of your heart. You will be given a medicine to help you relax. Do not drive or use machines until your doctor says that it is safe. This information is not intended to replace advice given to you by your health care provider. Make sure you discuss any questions you have with your health care provider. Document Revised: 02/15/2020 Document Reviewed: 02/15/2020 Elsevier Patient Education  2022 [place] After This sheet gives you information about how to care for yourself after your procedure. Your health care provider may also give you more specific instructions. If you have problems or questions, contact your health care provider. What can I expect after the procedure? After the procedure, it is common to have: Tiredness. Forgetfulness about what happened after the procedure. Impaired judgment for important decisions. Nausea or vomiting. Some difficulty with balance. Follow these instructions at home: For the time period you were told by your health care provider:   Rest as needed. Do not participate in activities where you could fall or become injured. Do not drive or use machinery. Do not drink alcohol. Do not take  sleeping pills or medicines that cause drowsiness. Do not make important decisions or sign legal documents. Do not take care of children on your own. Eating and drinking Follow the diet that is recommended by your health care provider. Drink enough fluid to keep your urine pale yellow. If you vomit: Drink water, juice, or soup when you can drink without vomiting. Make sure you have little or no nausea before eating solid foods. General instructions Have a responsible adult stay with you for the time you are told. It is important to have someone help care for you until you are awake and alert. Take over-the-counter and prescription medicines only as told by your health care provider. If you have sleep apnea, surgery and certain medicines can increase your risk for breathing problems. Follow instructions from your health care provider about wearing your sleep device: Anytime you are sleeping, including during daytime naps. While taking prescription pain medicines, sleeping medicines, or medicines that make you drowsy. Avoid smoking. Keep all  follow-up visits as told by your health care provider. This is important. Contact a health care provider if: You keep feeling nauseous or you keep vomiting. You feel light-headed. You are still sleepy or having trouble with balance after 24 hours. You develop a rash. You have a fever. You have redness or swelling around the IV site. Get help right away if: You have trouble breathing. You have new-onset confusion at home. Summary For several hours after your procedure, you may feel tired. You may also be forgetful and have poor judgment. Have a responsible adult stay with you for the time you are told. It is important to have someone help care for you until you are awake and alert. Rest as told. Do not drive or operate machinery. Do not drink alcohol or take sleeping pills. Get help right away if you have trouble breathing, or if you suddenly become  confused. This information is not intended to replace advice given to you by your health care provider. Make sure you discuss any questions you have with your health care provider. Document Revised: 03/09/2020 Document Reviewed: 05/27/2019 Elsevier Patient Education  2022 [employer].

## 2021-03-22 ENCOUNTER — Ambulatory Visit: Payer: Medicare Other | Admitting: Cardiology

## 2021-03-22 ENCOUNTER — Encounter (HOSPITAL_COMMUNITY): Payer: Self-pay

## 2021-03-22 ENCOUNTER — Encounter (HOSPITAL_COMMUNITY)
Admission: RE | Admit: 2021-03-22 | Discharge: 2021-03-22 | Disposition: A | Discharge status: Home / Self Care | Payer: Medicare Other | Source: Ambulatory Visit | Attending: Cardiology | Admitting: Cardiology

## 2021-03-22 ENCOUNTER — Other Ambulatory Visit: Payer: Self-pay

## 2021-03-22 DIAGNOSIS — Z01812 Encounter for preprocedural laboratory examination: Secondary | ICD-10-CM | POA: Diagnosis not present

## 2021-03-22 HISTORY — DX: Chronic kidney disease, stage 3 unspecified: N18.30

## 2021-03-22 HISTORY — DX: Chronic kidney disease, unspecified: N18.9

## 2021-03-22 LAB — BASIC METABOLIC PANEL
Anion gap: 8 (ref 5–15)
BUN: 20 mg/dL (ref 8–23)
CO2: 29 mmol/L (ref 22–32)
Calcium: 9.1 mg/dL (ref 8.9–10.3)
Chloride: 98 mmol/L (ref 98–111)
Creatinine, Ser: 1.19 mg/dL — ABNORMAL HIGH (ref 0.44–1.00)
GFR, Estimated: 50 mL/min — ABNORMAL LOW (ref >60)
Glucose, Bld: 98 mg/dL (ref 70–99)
Potassium: 3.3 mmol/L — ABNORMAL LOW (ref 3.5–5.1)
Sodium: 135 mmol/L (ref 135–145)

## 2021-03-26 ENCOUNTER — Other Ambulatory Visit: Payer: Self-pay | Admitting: Cardiology

## 2021-03-26 DIAGNOSIS — I340 Nonrheumatic mitral (valve) insufficiency: Secondary | ICD-10-CM

## 2021-03-27 ENCOUNTER — Ambulatory Visit (HOSPITAL_COMMUNITY): Payer: Medicare Other

## 2021-03-27 ENCOUNTER — Other Ambulatory Visit: Payer: Self-pay

## 2021-03-27 ENCOUNTER — Ambulatory Visit (HOSPITAL_COMMUNITY): Payer: Medicare Other | Admitting: Certified Registered"

## 2021-03-27 ENCOUNTER — Encounter (HOSPITAL_COMMUNITY): Payer: Self-pay | Admitting: Cardiology

## 2021-03-27 ENCOUNTER — Ambulatory Visit (HOSPITAL_COMMUNITY)
Admission: RE | Admit: 2021-03-27 | Discharge: 2021-03-27 | Disposition: A | Discharge status: Home / Self Care | Payer: Medicare Other | Attending: Cardiology | Admitting: Cardiology

## 2021-03-27 ENCOUNTER — Encounter (HOSPITAL_COMMUNITY)
Admission: RE | Disposition: A | Discharge status: Home / Self Care | Payer: Self-pay | Source: Home / Self Care | Attending: Cardiology

## 2021-03-27 DIAGNOSIS — I129 Hypertensive chronic kidney disease with stage 1 through stage 4 chronic kidney disease, or unspecified chronic kidney disease: Secondary | ICD-10-CM | POA: Insufficient documentation

## 2021-03-27 DIAGNOSIS — Z79899 Other long term (current) drug therapy: Secondary | ICD-10-CM | POA: Insufficient documentation

## 2021-03-27 DIAGNOSIS — Z885 Allergy status to narcotic agent status: Secondary | ICD-10-CM | POA: Diagnosis not present

## 2021-03-27 DIAGNOSIS — G4733 Obstructive sleep apnea (adult) (pediatric): Secondary | ICD-10-CM | POA: Diagnosis not present

## 2021-03-27 DIAGNOSIS — Z882 Allergy status to sulfonamides status: Secondary | ICD-10-CM | POA: Diagnosis not present

## 2021-03-27 DIAGNOSIS — I361 Nonrheumatic tricuspid (valve) insufficiency: Secondary | ICD-10-CM

## 2021-03-27 DIAGNOSIS — Z79890 Hormone replacement therapy (postmenopausal): Secondary | ICD-10-CM | POA: Insufficient documentation

## 2021-03-27 DIAGNOSIS — I340 Nonrheumatic mitral (valve) insufficiency: Secondary | ICD-10-CM

## 2021-03-27 DIAGNOSIS — Z888 Allergy status to other drugs, medicaments and biological substances status: Secondary | ICD-10-CM | POA: Diagnosis not present

## 2021-03-27 DIAGNOSIS — R0602 Shortness of breath: Secondary | ICD-10-CM | POA: Diagnosis not present

## 2021-03-27 DIAGNOSIS — F418 Other specified anxiety disorders: Secondary | ICD-10-CM | POA: Diagnosis not present

## 2021-03-27 HISTORY — PX: TEE WITHOUT CARDIOVERSION: SHX5443

## 2021-03-27 LAB — ECHO TEE: Radius: 0.7 cm

## 2021-03-27 SURGERY — ECHOCARDIOGRAM, TRANSESOPHAGEAL
Anesthesia: General

## 2021-03-27 MED ORDER — LIDOCAINE HCL (CARDIAC) PF 100 MG/5ML IV SOSY
PREFILLED_SYRINGE | INTRAVENOUS | Status: DC | PRN
  Administered 2021-03-27: 80 mg via INTRAVENOUS

## 2021-03-27 MED ORDER — LACTATED RINGERS IV SOLN
INTRAVENOUS | Status: DC
  Administered 2021-03-27: 1000 mL via INTRAVENOUS

## 2021-03-27 MED ORDER — PHENYLEPHRINE 40 MCG/ML (10ML) SYRINGE FOR IV PUSH (FOR BLOOD PRESSURE SUPPORT)
PREFILLED_SYRINGE | INTRAVENOUS | Status: DC | PRN
  Administered 2021-03-27 (×2): 80 ug via INTRAVENOUS

## 2021-03-27 MED ORDER — PROPOFOL 10 MG/ML IV BOLUS
INTRAVENOUS | Status: DC | PRN
  Administered 2021-03-27: 20 mg via INTRAVENOUS
  Administered 2021-03-27 (×2): 40 mg via INTRAVENOUS
  Administered 2021-03-27: 100 mg via INTRAVENOUS
  Administered 2021-03-27: 40 mg via INTRAVENOUS

## 2021-03-27 MED ORDER — PHENYLEPHRINE 40 MCG/ML (10ML) SYRINGE FOR IV PUSH (FOR BLOOD PRESSURE SUPPORT)
PREFILLED_SYRINGE | INTRAVENOUS | Status: AC
  Filled 2021-03-27: qty 10

## 2021-03-27 MED ORDER — SODIUM CHLORIDE 0.9 % IV SOLN: INTRAVENOUS | Status: DC

## 2021-03-27 MED ORDER — LIDOCAINE VISCOUS HCL 2 % MT SOLN
OROMUCOSAL | Status: AC
  Filled 2021-03-27: qty 15

## 2021-03-27 NOTE — H&P (Signed)
Patient presents for outpatient TEE for further evaluation of mitral regurgitation. For full history please see referenced clinic note blow. Plan for TEE today with assistance from anesthesiology.   [name] MD   Clinical Summary Jessica [name] is a 70 y.o.female seen today as a new consult. Referred by Dr Woody Seller for the following medical problems.    Mitral regurgitation - severe MR from pcp note   02/2021 echo at [facility]: LVEF >55%, severe MR. LVIDs 2.6     - SOB for last months. SOB at rest or with activities. DOE with just walking room to room home. Denies any recent edema.      2.OSA - followed by Dr Elsworth Soho     3. CKD - followed by Dr Theador Hawthorne     Past Medical History:  Diagnosis Date   Anxiety attack     Arthritis     Back pain, chronic     Crohn's colitis ([place])     Depression (emotion)     Dysrhythmia     GERD (gastroesophageal reflux disease)     Hypertension     Hypothyroid     Neuropathy     Sleep apnea      cpap is not working per patient - needs to find a new doctor - not used since 7/16 per pat    Vertigo      followed by Dr Wardell Heath- in [place]              Allergies  Allergen Reactions   Morphine And Related Hives and Itching      May cause blood pressure to drop   Robaxin [Methocarbamol] Itching      (PT WILL RESUME TAKING TO SEE IF THIS IS HER ALLERGY)   Sulfa Antibiotics Itching      ALL SULFA DRUGS              Current Outpatient Medications  Medication Sig Dispense Refill   amLODipine (NORVASC) 5 MG tablet Take 5 mg by mouth daily.       dicyclomine (BENTYL) 10 MG capsule Take 1 capsule (10 mg total) by mouth 2 (two) times daily before a meal. 60 capsule 5   fluticasone (FLONASE) 50 MCG/ACT nasal spray Place 1 spray into the nose 2 (two) times daily.       gabapentin (NEURONTIN) 300 MG capsule Take 300 mg by mouth 3 (three) times daily as needed.       hydrochlorothiazide (HYDRODIURIL) 12.5 MG tablet Take 1 tablet by mouth daily.        HYDROcodone-acetaminophen (NORCO/VICODIN) 5-325 MG tablet Take 1 tablet by mouth 2 (two) times daily as needed for moderate pain.       levothyroxine (SYNTHROID) 100 MCG tablet Take 100 mcg by mouth daily before breakfast.       loperamide (IMODIUM) 2 MG capsule Take 1 capsule (2 mg total) by mouth 2 (two) times daily as needed for diarrhea or loose stools. 180 capsule 0   Multiple Vitamin (MULTIVITAMIN WITH MINERALS) TABS tablet Take 1 tablet by mouth daily.       nystatin-triamcinolone (MYCOLOG II) cream Apply 1 application topically 2 (two) times daily. 30 g 1   pantoprazole (PROTONIX) 40 MG tablet Take 1 tablet (40 mg total) by mouth daily before breakfast. 90 tablet 3   rosuvastatin (CRESTOR) 5 MG tablet Take 5 mg by mouth daily.       sertraline (ZOLOFT) 100 MG tablet Take 1.5 tablets (150 mg total)  by mouth daily. 135 tablet 3   Vitamin D, Ergocalciferol, (DRISDOL) 1.25 MG (50000 UNIT) CAPS capsule TAKE 1 CAPSULE BY MOUTH TWICE A WEEK FOR 4 MONTHS THEN REDUCE TO ONCE WEEKLY (Patient not taking: Reported on 01/30/2021)        No current facility-administered medications for this visit.             Past Surgical History:  Procedure Laterality Date   ABDOMINAL HYSTERECTOMY       BIOPSY   06/08/2018    Procedure: BIOPSY;  Surgeon: Rogene Houston, MD;  [facility];  Service: Endoscopy;;  (colon)   CHOLECYSTECTOMY       COLON SURGERY        for Crohn's in the 80s in [place]   COLONOSCOPY N/A 03/11/2013    Procedure: COLONOSCOPY;  Surgeon: Rogene Houston, MD;  [facility];  Service: Endoscopy;  Laterality: N/A;  1200   COLONOSCOPY N/A 06/08/2018    Procedure: COLONOSCOPY;  Surgeon: Rogene Houston, MD;  [facility];  Service: Endoscopy;  Laterality: N/A;  8:25   fatty tumor removed       HARDWARE REMOVAL Right 08/07/2016    Procedure: HARDWARE REMOVAL;  Surgeon: Gaynelle Arabian, MD;  [facility];  Service: Orthopedics;  Laterality: Right;   Hip  replacement         Rt hip in 2010 in [place] ARTHROSCOPY Left 03/29/2015    Procedure: ARTHROSCOPY LEFT KNEE WITH MENSICAL DEBRIDEMENT, chondroplasty;  Surgeon: Gaynelle Arabian, MD;  [facility];  Service: Orthopedics;  Laterality: Left;   ORIF TIBIA PLATEAU Right 02/29/2016    Procedure: OPEN REDUCTION INTERNAL FIXATION (ORIF RIGHT  TIBIAL PLATEAU FRACTURE;  Surgeon: Gaynelle Arabian, MD;  [facility];  Service: Orthopedics;  Laterality: Right;   TOTAL KNEE ARTHROPLASTY Right 08/25/2017    Procedure: TOTAL RIGHT KNEE ARTHROPLASTY;  Surgeon: Gaynelle Arabian, MD;  [facility];  Service: Orthopedics;  Laterality: Right;             Allergies  Allergen Reactions   Morphine And Related Hives and Itching      May cause blood pressure to drop   Robaxin [Methocarbamol] Itching      (PT WILL RESUME TAKING TO SEE IF THIS IS HER ALLERGY)   Sulfa Antibiotics Itching      ALL SULFA DRUGS               Family History  Problem Relation Age of Onset   Crohn's disease Brother     Depression Mother     Anxiety disorder Mother     Depression Sister     Aneurysm Father     Colon cancer Neg Hx          Social History Jessica [name] reports that she has never smoked. She has never used smokeless tobacco. Jessica [name] reports no history of alcohol use.     Review of Systems CONSTITUTIONAL: No weight loss, fever, chills, weakness or fatigue.  HEENT: Eyes: No visual loss, blurred vision, double vision or yellow sclerae.No hearing loss, sneezing, congestion, runny nose or sore throat.  SKIN: No rash or itching.  CARDIOVASCULAR: per hpi RESPIRATORY: No shortness of breath, cough or sputum.  GASTROINTESTINAL: No anorexia, nausea, vomiting or diarrhea. No abdominal pain or blood.  GENITOURINARY: No burning on urination, no polyuria NEUROLOGICAL: No headache, dizziness, syncope, paralysis, ataxia, numbness or tingling in the extremities. No change in bowel  or bladder control.   MUSCULOSKELETAL: No muscle, back pain, joint pain or stiffness.  LYMPHATICS: No enlarged nodes. No history of splenectomy.  PSYCHIATRIC: No history of depression or anxiety.  ENDOCRINOLOGIC: No reports of sweating, cold or heat intolerance. No polyuria or polydipsia.  Marland Kitchen     Physical Examination    Today's Vitals    03/19/21 1037  BP: 118/68  Pulse: 72  SpO2: 94%  Weight: 163 lb 9.6 oz (74.2 kg)  Height: 5' 2"  (1.575 m)    Body mass index is 29.92 kg/m.   Gen: resting comfortably, no acute distress HEENT: no scleral icterus, pupils equal round and reactive, no palptable cervical adenopathy,  CV: RRR, 3/6 systolc murmur apex, no jvd Resp: Clear to auscultation bilaterally GI: abdomen is soft, non-tender, non-distended, normal bowel sounds, no hepatosplenomegaly MSK: extremities are warm, no edema.  Skin: warm, no rash Neuro:  no focal deficits Psych: appropriate affect         Assessment and Plan  1.Mitral regurgitation - report from pcp office indicates severe MR, normal LVEF, LVIDs 2.6 - reports some DOE that may be related, a lot of generalized fatigue that typically don't see with valvular disease. Appears euvoelmic, weight is actually down 7 lbs since 11/15/20. Given degree of reported symptoms would suspect some evidence of HF if valve was primary etiology but don't really see signs of fluid overload - plan for TEE to further evaluate valve   EKG shows NSR       Arnoldo Lenis, M.D.

## 2021-03-27 NOTE — Anesthesia Preprocedure Evaluation (Signed)
Anesthesia Evaluation  Patient identified by MRN, date of birth, ID band Patient awake    Reviewed: Allergy & Precautions, H&P , NPO status , Patient's Chart, lab work & pertinent test results, reviewed documented beta blocker date and time   Airway Mallampati: II  TM Distance: >3 FB Neck ROM: full    Dental no notable dental hx.    Pulmonary neg pulmonary ROS, sleep apnea ,    Pulmonary exam normal breath sounds clear to auscultation       Cardiovascular Exercise Tolerance: Good hypertension, negative cardio ROS  + Valvular Problems/Murmurs MR  Rhythm:regular Rate:Normal     Neuro/Psych PSYCHIATRIC DISORDERS Anxiety Depression negative neurological ROS  negative psych ROS   GI/Hepatic negative GI ROS, Neg liver ROS, GERD  ,  Endo/Other  negative endocrine ROS  Renal/GU CRFRenal diseasenegative Renal ROS  negative genitourinary   Musculoskeletal   Abdominal   Peds  Hematology negative hematology ROS (+)   Anesthesia Other Findings   Reproductive/Obstetrics negative OB ROS                             Anesthesia Physical Anesthesia Plan  ASA: 3  Anesthesia Plan: General   Post-op Pain Management:    Induction:   PONV Risk Score and Plan: Propofol infusion  Airway Management Planned:   Additional Equipment:   Intra-op Plan:   Post-operative Plan:   Informed Consent: I have reviewed the patients History and Physical, chart, labs and discussed the procedure including the risks, benefits and alternatives for the proposed anesthesia with the patient or authorized representative who has indicated his/her understanding and acceptance.     Dental Advisory Given  Plan Discussed with: CRNA  Anesthesia Plan Comments:         Anesthesia Quick Evaluation

## 2021-03-27 NOTE — Anesthesia Postprocedure Evaluation (Signed)
Anesthesia Post Note  Patient: Jessica [name]  Procedure(s) Performed: TRANSESOPHAGEAL ECHOCARDIOGRAM (TEE)  Patient location during evaluation: Phase II Anesthesia Type: General Level of consciousness: awake Pain management: pain level controlled Vital Signs Assessment: post-procedure vital signs reviewed and stable Respiratory status: spontaneous breathing and respiratory function stable Cardiovascular status: blood pressure returned to baseline and stable Postop Assessment: no headache and no apparent nausea or vomiting Anesthetic complications: no Comments: Late entry   No notable events documented.   Last Vitals:  Vitals:   03/27/21 0831 03/27/21 1000  BP: (!) 151/78 120/60  Pulse: 65 68  Resp: 16 20  Temp: 36.9 C 36.9 C  SpO2: 100% 100%    Last Pain:  Vitals:   03/27/21 1000  TempSrc: Oral  PainSc: 0-No pain                 Louann Sjogren

## 2021-03-27 NOTE — Transfer of Care (Signed)
Immediate Anesthesia Transfer of Care Note  Patient: Jessica [name]  Procedure(s) Performed: TRANSESOPHAGEAL ECHOCARDIOGRAM (TEE)  Patient Location: Short Stay  Anesthesia Type:General  Level of Consciousness: awake  Airway & Oxygen Therapy: Patient Spontanous Breathing  Post-op Assessment: Report given to RN and Post -op Vital signs reviewed and stable  Post vital signs: Reviewed and stable  Last Vitals:  Vitals Value Taken Time  BP    Temp    Pulse    Resp    SpO2      Last Pain:  Vitals:   03/27/21 0831  TempSrc: Oral  PainSc: 0-No pain      Patients Stated Pain Goal: 4 (17/53/01 0404)  Complications: No notable events documented.

## 2021-03-27 NOTE — Progress Notes (Signed)
  Echocardiogram Echocardiogram Transesophageal has been performed.  Matilde Bash 03/27/2021, 10:13 AM

## 2021-03-27 NOTE — Anesthesia Procedure Notes (Signed)
Date/Time: 03/27/2021 9:44 AM Performed by: Orlie Dakin, CRNA Pre-anesthesia Checklist: Patient identified, Emergency Drugs available, Suction available and Patient being monitored Patient Re-evaluated:Patient Re-evaluated prior to induction Oxygen Delivery Method: Nasal cannula Induction Type: IV induction Placement Confirmation: positive ETCO2

## 2021-03-27 NOTE — Progress Notes (Signed)
Order asked for echo, entered

## 2021-03-27 NOTE — CV Procedure (Signed)
CV Procedure Note  Procedure: Transesophageal echocardiogram Physician: Dr [name] MD Indication: Mitral regurgitation   Patient was brought to the procedure suite after appropriate consent was obtained. Sedation was achived with the assistance of anesthesiology, please see there note for full detals. Bite block placed, patient placed in left lateral decubitus position. TEE probe intubated into esophagus without difficultly and several views obtained. Please see full TEE report for full findings. On first review mild to moderate MR. Cardiopulmonary monitoring was performed throughout the procedure, she tolerated well without complications.   [name] MD

## 2021-03-28 ENCOUNTER — Inpatient Hospital Stay: Admit: 2021-03-28 | Payer: Medicare Other | Admitting: Orthopedic Surgery

## 2021-03-28 DIAGNOSIS — Z23 Encounter for immunization: Secondary | ICD-10-CM | POA: Diagnosis not present

## 2021-03-28 SURGERY — TOTAL KNEE REVISION
Anesthesia: Choice | Site: Knee | Laterality: Right

## 2021-03-30 ENCOUNTER — Encounter (HOSPITAL_COMMUNITY): Payer: Self-pay | Admitting: Cardiology

## 2021-04-03 ENCOUNTER — Telehealth: Payer: Self-pay | Admitting: Pulmonary Disease

## 2021-04-03 NOTE — Telephone Encounter (Signed)
Unable to download info from SD Card due to technical issues with airview. Pt aware and stated she would bring her card back another day for a download

## 2021-04-06 DIAGNOSIS — I10 Essential (primary) hypertension: Secondary | ICD-10-CM | POA: Diagnosis not present

## 2021-04-12 ENCOUNTER — Ambulatory Visit (INDEPENDENT_AMBULATORY_CARE_PROVIDER_SITE_OTHER): Payer: Medicare Other | Admitting: Pulmonary Disease

## 2021-04-12 ENCOUNTER — Encounter: Payer: Self-pay | Admitting: Pulmonary Disease

## 2021-04-12 ENCOUNTER — Other Ambulatory Visit: Payer: Self-pay

## 2021-04-12 DIAGNOSIS — G4733 Obstructive sleep apnea (adult) (pediatric): Secondary | ICD-10-CM | POA: Diagnosis not present

## 2021-04-12 DIAGNOSIS — R0609 Other forms of dyspnea: Secondary | ICD-10-CM

## 2021-04-12 NOTE — Progress Notes (Signed)
   Subjective:    Patient ID: Jessica [name], female    DOB: [birth year], 70 y.o.   MRN: [id number]  HPI  70 year old for follow-up of OSA and dyspnea  Chest x-ray on last office visit 11/2020 showed calcified nodule and new left lower lung, no infiltrates or effusions. She underwent home sleep test which showed moderate to severe OSA and was started on CPAP  She started off well with this and was very compliant, lately she is struggling a little bit.  She got a new mask and this is leaving a mark on the bridge of her nose, seems to be an AirFit F20 she brings the mask in with her. Husband has noted snoring. He complains that she is on the phone a lot during sleep times. Bedtime is around 10:00 takes the mask off around 4, on other nights mildly with until 6 AM Pressure is okay and overall does feel rested  Dyspnea stable, underwent echo and TEE and was found to have moderate MR  PMH -Crohn's disease, hypertension, hyperlipidemia, CKD stage III, moderate MR  Significant tests/ events reviewed  01/2021 HST showed mod  OSA with AHI 28/ hr    Review of Systems neg for any significant sore throat, dysphagia, itching, sneezing, nasal congestion or excess/ purulent secretions, fever, chills, sweats, unintended wt loss, pleuritic or exertional cp, hempoptysis, orthopnea pnd or change in chronic leg swelling. Also denies presyncope, palpitations, heartburn, abdominal pain, nausea, vomiting, diarrhea or change in bowel or urinary habits, dysuria,hematuria, rash, arthralgias, visual complaints, headache, numbness weakness or ataxia.     Objective:   Physical Exam  Gen. Pleasant, well-nourished, in no distress ENT - no thrush, no pallor/icterus,no post nasal drip Neck: No JVD, no thyromegaly, no carotid bruits Lungs: no use of accessory muscles, no dullness to percussion, clear without rales or rhonchi  Cardiovascular: Rhythm regular, heart sounds  normal, no murmurs or gallops, no peripheral  edema Musculoskeletal: No deformities, no cyanosis or clubbing        Assessment & Plan:

## 2021-04-12 NOTE — Assessment & Plan Note (Signed)
CPAP download was reviewed which shows average pressure of 11 to 12 cm, with residual AHI of 12/hour on auto settings with central 6/hour, compliance is good more than 5 hours every night with very few missed nights. Overall she is adjusting well. We will provide her with an AirFit F30 fullface mask to avoid pressure on the bridge of her nose I will change auto settings to 8 to 15 cm hopefully this will take care of nasal obstructive apneas.  We will follow-up with a download to ensure that she does not develop increased centrals if she does then she may need a formal titration study I have also asked her to report back to [place] in 1 month to see how she is adjusting  Weight loss encouraged, compliance with goal of at least 4-6 hrs every night is the expectation. Advised against medications with sedative side effects Cautioned against driving when sleepy - understanding that sleepiness will vary on a day to day basis

## 2021-04-12 NOTE — Assessment & Plan Note (Signed)
She has been diagnosed with moderate MR on TEE and this is the likely cause of her dyspnea. Did not have to pursue pulmonary causes at this time

## 2021-04-12 NOTE — Patient Instructions (Signed)
Trial of small airfit F 30 full face mask Change to auto CPAP 8-15 cm , DL in 1 month

## 2021-04-17 DIAGNOSIS — Z79899 Other long term (current) drug therapy: Secondary | ICD-10-CM | POA: Diagnosis not present

## 2021-04-17 DIAGNOSIS — I7300 Raynaud's syndrome without gangrene: Secondary | ICD-10-CM | POA: Diagnosis not present

## 2021-04-17 DIAGNOSIS — G5603 Carpal tunnel syndrome, bilateral upper limbs: Secondary | ICD-10-CM | POA: Diagnosis not present

## 2021-04-17 DIAGNOSIS — M5412 Radiculopathy, cervical region: Secondary | ICD-10-CM | POA: Diagnosis not present

## 2021-04-17 DIAGNOSIS — M5417 Radiculopathy, lumbosacral region: Secondary | ICD-10-CM | POA: Diagnosis not present

## 2021-04-17 DIAGNOSIS — G2581 Restless legs syndrome: Secondary | ICD-10-CM | POA: Diagnosis not present

## 2021-04-17 DIAGNOSIS — G603 Idiopathic progressive neuropathy: Secondary | ICD-10-CM | POA: Diagnosis not present

## 2021-04-20 ENCOUNTER — Other Ambulatory Visit: Payer: Self-pay

## 2021-04-20 ENCOUNTER — Encounter: Payer: Self-pay | Admitting: Cardiology

## 2021-04-20 ENCOUNTER — Ambulatory Visit (INDEPENDENT_AMBULATORY_CARE_PROVIDER_SITE_OTHER): Payer: Medicare Other | Admitting: Cardiology

## 2021-04-20 VITALS — BP 130/76 | HR 74 | Ht 62.0 in | Wt 165.4 lb

## 2021-04-20 DIAGNOSIS — R0602 Shortness of breath: Secondary | ICD-10-CM

## 2021-04-20 DIAGNOSIS — I340 Nonrheumatic mitral (valve) insufficiency: Secondary | ICD-10-CM

## 2021-04-20 DIAGNOSIS — Z01810 Encounter for preprocedural cardiovascular examination: Secondary | ICD-10-CM

## 2021-04-20 NOTE — Patient Instructions (Signed)
Medication Instructions:  Your physician recommends that you continue on your current medications as directed. Please refer to the Current Medication list given to you today.  *If you need a refill on your cardiac medications before your next appointment, please call your pharmacy*   Lab Work: We have requested your most recent lab work from your primary care provider If you have labs (blood work) drawn today and your tests are completely normal, you will receive your results only by: Exline (if you have MyChart) OR A paper copy in the mail If you have any lab test that is abnormal or we need to change your treatment, we will call you to review the results.   Testing/Procedures: None   Follow-Up: At [facility], you and your health needs are our priority.  As part of our continuing mission to provide you with exceptional heart care, we have created designated Provider Care Teams.  These Care Teams include your primary Cardiologist (physician) and Advanced Practice Providers (APPs -  Physician Assistants and Nurse Practitioners) who all work together to provide you with the care you need, when you need it.  We recommend signing up for the patient portal called "MyChart".  Sign up information is provided on this After Visit Summary.  MyChart is used to connect with patients for Virtual Visits (Telemedicine).  Patients are able to view lab/test results, encounter notes, upcoming appointments, etc.  Non-urgent messages can be sent to your provider as well.   To learn more about what you can do with MyChart, go to [email or web address].    Your next appointment:   6 month(s)  The format for your next appointment:   In Person  Provider:   [name], MD   Other Instructions

## 2021-04-20 NOTE — Progress Notes (Signed)
Clinical Summary Jessica [name] is a 70 y.o.female  Mitral regurgitation - severe MR from pcp note   02/2021 echo at [place]: LVEF >55%, severe MR. LVIDs 2.6     - SOB for last months. SOB at rest or with activities. DOE with just walking room to room home. Denies any recent edema.    03/2021 TEE: moderate MR 12/2020 TSH 8.96 Hgb 13 Plt 169 TSH 8.96   2. SOB - SOB improving since last visit - ongoing generalized fatigue.    3.OSA - followed by Dr Elsworth Soho     4. CKD - followed by Dr Theador Hawthorne  5.Hypothyroidism - last TSH was elevated from 12/2020, perhaps playing a role in her fatigue   6. Preoperative evaluation - considering redo knee replacement - DOE walking up a flight of stairs Past Medical History:  Diagnosis Date   Anxiety attack    Arthritis    Back pain, chronic    Chronic kidney disease    Crohn's colitis ([place])    Depression (emotion)    Dysrhythmia    GERD (gastroesophageal reflux disease)    Hypertension    Hypothyroid    Neuropathy    Sleep apnea    cpap is not working per patient - needs to find a new doctor - not used since 7/16 per pat    Stage 3 chronic kidney disease ([place])    Vertigo    followed by Dr Wardell Heath- in [place]      Allergies  Allergen Reactions   Morphine And Related Hives and Itching    May cause blood pressure to drop   Robaxin [Methocarbamol] Itching   Sulfa Antibiotics Itching    ALL SULFA DRUGS     Current Outpatient Medications  Medication Sig Dispense Refill   alendronate (FOSAMAX) 70 MG tablet Take 70 mg by mouth every Monday.     amLODipine (NORVASC) 5 MG tablet Take 5 mg by mouth daily.     Carboxymethylcellulose Sodium (RETAINE [facility] OP) Place 1 drop into both eyes every 6 (six) hours as needed (dry eyes).     dicyclomine (BENTYL) 10 MG capsule Take 1 capsule (10 mg total) by mouth 2 (two) times daily before a meal. 60 capsule 5   fluticasone (FLONASE) 50 MCG/ACT nasal spray Place 1 spray into the nose daily as  needed for allergies or rhinitis.     hydrochlorothiazide (HYDRODIURIL) 12.5 MG tablet Take 12.5 mg by mouth daily.     HYDROcodone-acetaminophen (NORCO/VICODIN) 5-325 MG tablet Take 1 tablet by mouth at bedtime.     levothyroxine (SYNTHROID) 75 MCG tablet Take 75 mcg by mouth daily before breakfast.     loperamide (IMODIUM) 2 MG capsule Take 1 capsule (2 mg total) by mouth 2 (two) times daily as needed for diarrhea or loose stools. (Patient taking differently: Take 2 mg by mouth 2 (two) times daily.) 180 capsule 0   metoprolol succinate (TOPROL-XL) 25 MG 24 hr tablet Take 25 mg by mouth daily.     Multiple Vitamin (MULTIVITAMIN WITH MINERALS) TABS tablet Take 1 tablet by mouth daily.     nystatin-triamcinolone (MYCOLOG II) cream Apply 1 application topically 2 (two) times daily. (Patient taking differently: Apply 1 application topically 2 (two) times daily as needed (rash).) 30 g 1   pantoprazole (PROTONIX) 40 MG tablet Take 1 tablet (40 mg total) by mouth daily before breakfast. (Patient taking differently: Take 40 mg by mouth daily as needed (acid reflux).) 90 tablet  3   rOPINIRole (REQUIP) 0.5 MG tablet Take 0.5 mg by mouth in the morning and at bedtime.     rosuvastatin (CRESTOR) 5 MG tablet Take 5 mg by mouth daily.     sertraline (ZOLOFT) 100 MG tablet Take 1.5 tablets (150 mg total) by mouth daily. 135 tablet 3   No current facility-administered medications for this visit.     Past Surgical History:  Procedure Laterality Date   ABDOMINAL HYSTERECTOMY     BIOPSY  06/08/2018   Procedure: BIOPSY;  Surgeon: Rogene Houston, MD;  Location: [facility];  Service: Endoscopy;;  (colon)   CHOLECYSTECTOMY     COLON SURGERY     for Crohn's in the 80s in [place]   COLONOSCOPY N/A 03/11/2013   Procedure: COLONOSCOPY;  Surgeon: Rogene Houston, MD;  Location: [facility];  Service: Endoscopy;  Laterality: N/A;  1200   COLONOSCOPY N/A 06/08/2018   Procedure: COLONOSCOPY;  Surgeon: Rogene Houston, MD;  Location: [facility];  Service: Endoscopy;  Laterality: N/A;  8:25   fatty tumor removed     HARDWARE REMOVAL Right 08/07/2016   Procedure: HARDWARE REMOVAL;  Surgeon: Gaynelle Arabian, MD;  Location: [facility];  Service: Orthopedics;  Laterality: Right;   Hip replacement      Rt hip in 2010 in [place] ARTHROSCOPY Left 03/29/2015   Procedure: ARTHROSCOPY LEFT KNEE WITH MENSICAL DEBRIDEMENT, chondroplasty;  Surgeon: Gaynelle Arabian, MD;  Location: [facility];  Service: Orthopedics;  Laterality: Left;   ORIF TIBIA PLATEAU Right 02/29/2016   Procedure: OPEN REDUCTION INTERNAL FIXATION (ORIF RIGHT  TIBIAL PLATEAU FRACTURE;  Surgeon: Gaynelle Arabian, MD;  Location: [facility];  Service: Orthopedics;  Laterality: Right;   TEE WITHOUT CARDIOVERSION N/A 03/27/2021   Procedure: TRANSESOPHAGEAL ECHOCARDIOGRAM (TEE);  Surgeon: Arnoldo Lenis, MD;  Location[facility];  Service: Endoscopy;  Laterality: N/A;   TOTAL KNEE ARTHROPLASTY Right 08/25/2017   Procedure: TOTAL RIGHT KNEE ARTHROPLASTY;  Surgeon: Gaynelle Arabian, MD;  Location: [facility];  Service: Orthopedics;  Laterality: Right;     Allergies  Allergen Reactions   Morphine And Related Hives and Itching    May cause blood pressure to drop   Robaxin [Methocarbamol] Itching   Sulfa Antibiotics Itching    ALL SULFA DRUGS      Family History  Problem Relation Age of Onset   Crohn's disease Brother    Depression Mother    Anxiety disorder Mother    Depression Sister    Aneurysm Father    Colon cancer Neg Hx      Social History Jessica [name] reports that she has never smoked. She has never used smokeless tobacco. Jessica [name] reports no history of alcohol use.   Review of Systems CONSTITUTIONAL: No weight loss, fever, chills, weakness or fatigue.  HEENT: Eyes: No visual loss, blurred vision, double vision or yellow sclerae.No hearing loss, sneezing, congestion, runny nose or sore throat.  SKIN: No rash or itching.   CARDIOVASCULAR: per hpi RESPIRATORY: per hpi GASTROINTESTINAL: No anorexia, nausea, vomiting or diarrhea. No abdominal pain or blood.  GENITOURINARY: No burning on urination, no polyuria NEUROLOGICAL: No headache, dizziness, syncope, paralysis, ataxia, numbness or tingling in the extremities. No change in bowel or bladder control.  MUSCULOSKELETAL: No muscle, back pain, joint pain or stiffness.  LYMPHATICS: No enlarged nodes. No history of splenectomy.  PSYCHIATRIC: No history of depression or anxiety.  ENDOCRINOLOGIC: No reports of sweating, cold or heat intolerance. No  polyuria or polydipsia.  Marland Kitchen   Physical Examination Today's Vitals   04/20/21 0959  BP: 130/76  Pulse: 74  SpO2: 96%  Weight: 165 lb 6.4 oz (75 kg)  Height: 5' 2"  (1.575 m)   Body mass index is 30.25 kg/m.  Gen: resting comfortably, no acute distress HEENT: no scleral icterus, pupils equal round and reactive, no palptable cervical adenopathy,  CV: RRR, 2/6 systolic murmur apex, no jvd Resp: Clear to auscultation bilaterally GI: abdomen is soft, non-tender, non-distended, normal bowel sounds, no hepatosplenomegaly MSK: extremities are warm, trace bilateral edema Skin: warm, no rash Neuro:  no focal deficits Psych: appropriate affect   Diagnostic Studies 03/2021 echo IMPRESSIONS     1. Left ventricular ejection fraction, by estimation, is 60 to 65%. The  left ventricle has normal function. The left ventricle has no regional  wall motion abnormalities.   2. Right ventricular systolic function is normal. The right ventricular  size is normal.   3. No left atrial/left atrial appendage thrombus was detected. The LAA  emptying velocity was 54 cm/s.   4. The MR vena contracta is 0.4 cm. . The mitral valve is abnormal.  Moderate mitral valve regurgitation. No evidence of mitral stenosis.   5. The aortic valve is tricuspid. Aortic valve regurgitation is not  visualized. No aortic stenosis is present.      Assessment and Plan   1.Mitral regurgitation - report from pcp echoindicates severe MR, normal LVEF, LVIDs 2.6 - reports some DOE that may be related, a lot of generalized fatigue that typically don't see with valvular disease.  - TEE with just moderate MR, would not explain her symptoms - continue to monitor her MR, repeat echo 1 year    2. SOB/Fatigue - unclear etiology - TEE with just moderate MR, would not expain her symptoms - symptoms are actually improving, monitor at this time. Could consider lexiscan if progression.   3. Preoperative evaluation - considering knee surgery - DOE with <4METs. Would need lexisscan prior to surgery,she will call when ready to schedule.   Arnoldo Lenis, M.D.

## 2021-04-24 ENCOUNTER — Other Ambulatory Visit (HOSPITAL_COMMUNITY): Payer: Medicare Other

## 2021-04-26 ENCOUNTER — Telehealth: Payer: Self-pay | Admitting: *Deleted

## 2021-04-26 ENCOUNTER — Telehealth: Payer: Self-pay | Admitting: Pulmonary Disease

## 2021-04-26 ENCOUNTER — Other Ambulatory Visit: Payer: Self-pay

## 2021-04-26 ENCOUNTER — Other Ambulatory Visit: Payer: Self-pay | Admitting: *Deleted

## 2021-04-26 DIAGNOSIS — G4733 Obstructive sleep apnea (adult) (pediatric): Secondary | ICD-10-CM

## 2021-04-26 DIAGNOSIS — I129 Hypertensive chronic kidney disease with stage 1 through stage 4 chronic kidney disease, or unspecified chronic kidney disease: Secondary | ICD-10-CM | POA: Diagnosis not present

## 2021-04-26 DIAGNOSIS — Z1231 Encounter for screening mammogram for malignant neoplasm of breast: Secondary | ICD-10-CM | POA: Diagnosis not present

## 2021-04-26 DIAGNOSIS — N1831 Chronic kidney disease, stage 3a: Secondary | ICD-10-CM | POA: Diagnosis not present

## 2021-04-26 DIAGNOSIS — E876 Hypokalemia: Secondary | ICD-10-CM | POA: Diagnosis not present

## 2021-04-26 DIAGNOSIS — E211 Secondary hyperparathyroidism, not elsewhere classified: Secondary | ICD-10-CM | POA: Diagnosis not present

## 2021-04-26 NOTE — Telephone Encounter (Signed)
-----   Message from Arnoldo Lenis, MD sent at 04/10/2021  4:10 PM EDT ----- Potassium low, would start potassium chloride 12mq daily   JZandra AbtsMD

## 2021-04-26 NOTE — Telephone Encounter (Signed)
Called patient to discuss cpap settings. Son answered phone. Not on DPR so he will get pt to call back and answer questions.   Order placed for settings change from last OV on 04/12/21.

## 2021-04-26 NOTE — Telephone Encounter (Signed)
Laurine Blazer, LPN  [id number]/9980  6:99 PM EDT Back to Top    Notified, copy to pcp.  Patient states that kidney doctor just started her on Potassium 4mq 2 tabs daily. She will get labs rechecked in 5 day with them.

## 2021-04-26 NOTE — Telephone Encounter (Signed)
Spoke to patient she is aware that the order was placed today. Nothing further needed at this time.

## 2021-05-15 DIAGNOSIS — G5603 Carpal tunnel syndrome, bilateral upper limbs: Secondary | ICD-10-CM | POA: Diagnosis not present

## 2021-05-15 DIAGNOSIS — Z79899 Other long term (current) drug therapy: Secondary | ICD-10-CM | POA: Diagnosis not present

## 2021-05-15 DIAGNOSIS — I7300 Raynaud's syndrome without gangrene: Secondary | ICD-10-CM | POA: Diagnosis not present

## 2021-05-15 DIAGNOSIS — M5442 Lumbago with sciatica, left side: Secondary | ICD-10-CM | POA: Diagnosis not present

## 2021-05-15 DIAGNOSIS — M5441 Lumbago with sciatica, right side: Secondary | ICD-10-CM | POA: Diagnosis not present

## 2021-05-15 DIAGNOSIS — G2581 Restless legs syndrome: Secondary | ICD-10-CM | POA: Diagnosis not present

## 2021-05-28 DIAGNOSIS — E876 Hypokalemia: Secondary | ICD-10-CM | POA: Diagnosis not present

## 2021-05-28 DIAGNOSIS — I129 Hypertensive chronic kidney disease with stage 1 through stage 4 chronic kidney disease, or unspecified chronic kidney disease: Secondary | ICD-10-CM | POA: Diagnosis not present

## 2021-05-28 DIAGNOSIS — E211 Secondary hyperparathyroidism, not elsewhere classified: Secondary | ICD-10-CM | POA: Diagnosis not present

## 2021-05-28 DIAGNOSIS — N1831 Chronic kidney disease, stage 3a: Secondary | ICD-10-CM | POA: Diagnosis not present

## 2021-05-28 DIAGNOSIS — D519 Vitamin B12 deficiency anemia, unspecified: Secondary | ICD-10-CM | POA: Diagnosis not present

## 2021-06-01 ENCOUNTER — Other Ambulatory Visit (INDEPENDENT_AMBULATORY_CARE_PROVIDER_SITE_OTHER): Payer: Self-pay | Admitting: Internal Medicine

## 2021-06-03 DIAGNOSIS — R112 Nausea with vomiting, unspecified: Secondary | ICD-10-CM | POA: Diagnosis not present

## 2021-06-03 DIAGNOSIS — E876 Hypokalemia: Secondary | ICD-10-CM | POA: Diagnosis not present

## 2021-06-03 DIAGNOSIS — R000 Tachycardia, unspecified: Secondary | ICD-10-CM | POA: Diagnosis not present

## 2021-06-03 DIAGNOSIS — R110 Nausea: Secondary | ICD-10-CM | POA: Diagnosis not present

## 2021-06-03 DIAGNOSIS — R42 Dizziness and giddiness: Secondary | ICD-10-CM | POA: Diagnosis not present

## 2021-06-03 DIAGNOSIS — S50811A Abrasion of right forearm, initial encounter: Secondary | ICD-10-CM | POA: Diagnosis not present

## 2021-06-03 DIAGNOSIS — D3610 Benign neoplasm of peripheral nerves and autonomic nervous system, unspecified: Secondary | ICD-10-CM | POA: Diagnosis not present

## 2021-06-03 DIAGNOSIS — Z79899 Other long term (current) drug therapy: Secondary | ICD-10-CM | POA: Diagnosis not present

## 2021-06-03 DIAGNOSIS — R0689 Other abnormalities of breathing: Secondary | ICD-10-CM | POA: Diagnosis not present

## 2021-06-03 DIAGNOSIS — G319 Degenerative disease of nervous system, unspecified: Secondary | ICD-10-CM | POA: Diagnosis not present

## 2021-06-03 DIAGNOSIS — I10 Essential (primary) hypertension: Secondary | ICD-10-CM | POA: Diagnosis not present

## 2021-06-04 NOTE — Telephone Encounter (Signed)
Last seen 01/30/21

## 2021-06-05 DIAGNOSIS — N183 Chronic kidney disease, stage 3 unspecified: Secondary | ICD-10-CM | POA: Diagnosis not present

## 2021-06-05 DIAGNOSIS — R42 Dizziness and giddiness: Secondary | ICD-10-CM | POA: Diagnosis not present

## 2021-06-05 DIAGNOSIS — D692 Other nonthrombocytopenic purpura: Secondary | ICD-10-CM | POA: Diagnosis not present

## 2021-06-05 DIAGNOSIS — Z299 Encounter for prophylactic measures, unspecified: Secondary | ICD-10-CM | POA: Diagnosis not present

## 2021-06-05 DIAGNOSIS — I10 Essential (primary) hypertension: Secondary | ICD-10-CM | POA: Diagnosis not present

## 2021-06-06 DIAGNOSIS — E7800 Pure hypercholesterolemia, unspecified: Secondary | ICD-10-CM | POA: Diagnosis not present

## 2021-06-06 DIAGNOSIS — I10 Essential (primary) hypertension: Secondary | ICD-10-CM | POA: Diagnosis not present

## 2021-06-20 DIAGNOSIS — Z299 Encounter for prophylactic measures, unspecified: Secondary | ICD-10-CM | POA: Diagnosis not present

## 2021-06-20 DIAGNOSIS — G319 Degenerative disease of nervous system, unspecified: Secondary | ICD-10-CM | POA: Diagnosis not present

## 2021-06-20 DIAGNOSIS — I10 Essential (primary) hypertension: Secondary | ICD-10-CM | POA: Diagnosis not present

## 2021-06-20 DIAGNOSIS — N183 Chronic kidney disease, stage 3 unspecified: Secondary | ICD-10-CM | POA: Diagnosis not present

## 2021-06-21 DIAGNOSIS — D333 Benign neoplasm of cranial nerves: Secondary | ICD-10-CM | POA: Diagnosis not present

## 2021-06-26 DIAGNOSIS — N1831 Chronic kidney disease, stage 3a: Secondary | ICD-10-CM | POA: Diagnosis not present

## 2021-06-26 DIAGNOSIS — E876 Hypokalemia: Secondary | ICD-10-CM | POA: Diagnosis not present

## 2021-06-26 DIAGNOSIS — I129 Hypertensive chronic kidney disease with stage 1 through stage 4 chronic kidney disease, or unspecified chronic kidney disease: Secondary | ICD-10-CM | POA: Diagnosis not present

## 2021-06-26 DIAGNOSIS — E211 Secondary hyperparathyroidism, not elsewhere classified: Secondary | ICD-10-CM | POA: Diagnosis not present

## 2021-07-06 DIAGNOSIS — E7800 Pure hypercholesterolemia, unspecified: Secondary | ICD-10-CM | POA: Diagnosis not present

## 2021-07-06 DIAGNOSIS — I10 Essential (primary) hypertension: Secondary | ICD-10-CM | POA: Diagnosis not present

## 2021-07-13 ENCOUNTER — Ambulatory Visit (INDEPENDENT_AMBULATORY_CARE_PROVIDER_SITE_OTHER): Payer: Medicare Other | Admitting: Pulmonary Disease

## 2021-07-13 ENCOUNTER — Encounter: Payer: Self-pay | Admitting: Pulmonary Disease

## 2021-07-13 ENCOUNTER — Other Ambulatory Visit: Payer: Self-pay

## 2021-07-13 DIAGNOSIS — G4733 Obstructive sleep apnea (adult) (pediatric): Secondary | ICD-10-CM | POA: Diagnosis not present

## 2021-07-13 DIAGNOSIS — R0609 Other forms of dyspnea: Secondary | ICD-10-CM

## 2021-07-13 NOTE — Assessment & Plan Note (Signed)
CPAP download was reviewed which shows average usage about 5 hours per night with several missed nights, average pressure is 12 cm on auto settings 8 to 15 cm with residual centrals of 5/hour and residual AHI of 9/hour.  We will change her back to auto settings 4 to 13 cm Provide her with a new AirFit F30 fullface mask hopefully this will take care of the nasal pressure  Weight loss encouraged, compliance with goal of at least 4-6 hrs every night is the expectation. Advised against medications with sedative side effects Cautioned against driving when sleepy - understanding that sleepiness will vary on a day to day basis

## 2021-07-13 NOTE — Progress Notes (Signed)
° °  Subjective:    Patient ID: Jessica [name], female    DOB: [birth year], 71 y.o.   MRN: [id number]  HPI  71 year old for follow-up of OSA and dyspnea  PMH -Crohn's disease, hypertension, hyperlipidemia, CKD stage III, moderate MR -schwannoma RT ear   Chief Complaint  Patient presents with   Follow-up    Patient thinks that her settings need to be changed. She thinks that she is getting to much air now. States that he mouth comes open at night while sleeping and makes her mouth to dry    On last office visit, downloads showed average pressure of 12 cm with residual central events 6/hour.  We change her to auto settings 8 to 15 cm  She now reports that the pressure is too high and her mouth is too dry.  She developed Vertigo over Christmas, she has a schwannoma which is being monitored. Reviewed cardiology consultation, MR was noted to be moderate on TEE and follow-up echo is advised.  She remains short of breath.  She is very hard of hearing, accompanied by her husband who corroborates history Did not obtain a new mask and continues to have problem with full facemask leaving a pressure mark over the bridge of her nose   Significant tests/ events reviewed   01/2021 HST showed mod  OSA with AHI 28/ hr Review of Systems neg for any significant sore throat, dysphagia, itching, sneezing, nasal congestion or excess/ purulent secretions, fever, chills, sweats, unintended wt loss, pleuritic or exertional cp, hempoptysis, orthopnea pnd or change in chronic leg swelling. Also denies presyncope, palpitations, heartburn, abdominal pain, nausea, vomiting, diarrhea or change in bowel or urinary habits, dysuria,hematuria, rash, arthralgias, visual complaints, headache, numbness weakness or ataxia.     Objective:   Physical Exam  Gen. Pleasant, obese, in no distress ENT - no lesions, no post nasal drip Neck: No JVD, no thyromegaly, no carotid bruits Lungs: no use of accessory muscles, no dullness  to percussion, decreased without rales or rhonchi  Cardiovascular: Rhythm regular, heart sounds  normal, PSM 3/6 murmurs or gallops, no peripheral edema Musculoskeletal: No deformities, no cyanosis or clubbing , no tremors       Assessment & Plan:

## 2021-07-13 NOTE — Patient Instructions (Signed)
°  X Rx for airfit F 30 full face mask  X change auto to 4-13 cm  Increase humidity

## 2021-07-13 NOTE — Assessment & Plan Note (Signed)
Attributed to MR, follow-up echo is planned

## 2021-07-17 DIAGNOSIS — N1831 Chronic kidney disease, stage 3a: Secondary | ICD-10-CM | POA: Diagnosis not present

## 2021-07-17 DIAGNOSIS — I10 Essential (primary) hypertension: Secondary | ICD-10-CM | POA: Diagnosis not present

## 2021-07-17 DIAGNOSIS — R42 Dizziness and giddiness: Secondary | ICD-10-CM | POA: Diagnosis not present

## 2021-07-17 DIAGNOSIS — R5383 Other fatigue: Secondary | ICD-10-CM | POA: Diagnosis not present

## 2021-07-17 DIAGNOSIS — D692 Other nonthrombocytopenic purpura: Secondary | ICD-10-CM | POA: Diagnosis not present

## 2021-07-17 DIAGNOSIS — Z299 Encounter for prophylactic measures, unspecified: Secondary | ICD-10-CM | POA: Diagnosis not present

## 2021-07-17 DIAGNOSIS — F331 Major depressive disorder, recurrent, moderate: Secondary | ICD-10-CM | POA: Diagnosis not present

## 2021-07-17 DIAGNOSIS — E7800 Pure hypercholesterolemia, unspecified: Secondary | ICD-10-CM | POA: Diagnosis not present

## 2021-07-24 DIAGNOSIS — G5603 Carpal tunnel syndrome, bilateral upper limbs: Secondary | ICD-10-CM | POA: Diagnosis not present

## 2021-07-24 DIAGNOSIS — M5412 Radiculopathy, cervical region: Secondary | ICD-10-CM | POA: Diagnosis not present

## 2021-07-24 DIAGNOSIS — G2581 Restless legs syndrome: Secondary | ICD-10-CM | POA: Diagnosis not present

## 2021-07-24 DIAGNOSIS — M545 Low back pain, unspecified: Secondary | ICD-10-CM | POA: Diagnosis not present

## 2021-08-06 DIAGNOSIS — K5010 Crohn's disease of large intestine without complications: Secondary | ICD-10-CM | POA: Diagnosis not present

## 2021-08-06 DIAGNOSIS — Z299 Encounter for prophylactic measures, unspecified: Secondary | ICD-10-CM | POA: Diagnosis not present

## 2021-08-06 DIAGNOSIS — G319 Degenerative disease of nervous system, unspecified: Secondary | ICD-10-CM | POA: Diagnosis not present

## 2021-08-06 DIAGNOSIS — I10 Essential (primary) hypertension: Secondary | ICD-10-CM | POA: Diagnosis not present

## 2021-08-06 DIAGNOSIS — J329 Chronic sinusitis, unspecified: Secondary | ICD-10-CM | POA: Diagnosis not present

## 2021-08-07 DIAGNOSIS — I10 Essential (primary) hypertension: Secondary | ICD-10-CM | POA: Diagnosis not present

## 2021-08-07 DIAGNOSIS — E7800 Pure hypercholesterolemia, unspecified: Secondary | ICD-10-CM | POA: Diagnosis not present

## 2021-08-17 DIAGNOSIS — G319 Degenerative disease of nervous system, unspecified: Secondary | ICD-10-CM | POA: Diagnosis not present

## 2021-08-17 DIAGNOSIS — I10 Essential (primary) hypertension: Secondary | ICD-10-CM | POA: Diagnosis not present

## 2021-08-17 DIAGNOSIS — Z299 Encounter for prophylactic measures, unspecified: Secondary | ICD-10-CM | POA: Diagnosis not present

## 2021-08-17 DIAGNOSIS — Z789 Other specified health status: Secondary | ICD-10-CM | POA: Diagnosis not present

## 2021-08-17 DIAGNOSIS — F419 Anxiety disorder, unspecified: Secondary | ICD-10-CM | POA: Diagnosis not present

## 2021-08-17 DIAGNOSIS — K5010 Crohn's disease of large intestine without complications: Secondary | ICD-10-CM | POA: Diagnosis not present

## 2021-08-17 DIAGNOSIS — Z6831 Body mass index (BMI) 31.0-31.9, adult: Secondary | ICD-10-CM | POA: Diagnosis not present

## 2021-08-21 ENCOUNTER — Telehealth (HOSPITAL_COMMUNITY): Payer: Self-pay | Admitting: Psychiatry

## 2021-08-23 ENCOUNTER — Other Ambulatory Visit: Payer: Self-pay

## 2021-08-23 ENCOUNTER — Ambulatory Visit (INDEPENDENT_AMBULATORY_CARE_PROVIDER_SITE_OTHER): Payer: Medicare Other | Admitting: Psychiatry

## 2021-08-23 ENCOUNTER — Encounter (HOSPITAL_COMMUNITY): Payer: Self-pay | Admitting: Psychiatry

## 2021-08-23 DIAGNOSIS — F331 Major depressive disorder, recurrent, moderate: Secondary | ICD-10-CM | POA: Diagnosis not present

## 2021-08-23 MED ORDER — TRAZODONE HCL 50 MG PO TABS: 50.0000 mg | ORAL_TABLET | Freq: Every day | ORAL | 2 refills | Status: DC

## 2021-08-23 MED ORDER — SERTRALINE HCL 100 MG PO TABS: 150.0000 mg | ORAL_TABLET | Freq: Every day | ORAL | 3 refills | Status: DC

## 2021-08-23 MED ORDER — BUSPIRONE HCL 5 MG PO TABS: 5.0000 mg | ORAL_TABLET | Freq: Three times a day (TID) | ORAL | 2 refills | Status: DC

## 2021-08-23 NOTE — Progress Notes (Signed)
BH MD/PA/NP OP Progress Note  08/23/2021 11:59 AM Jessica [name]  MRN:  [id number]  Chief Complaint:  Chief Complaint  Patient presents with   Depression   Anxiety   Follow-up   HPI: This patient is a 71 year old married white female who lives with her husband in [place].  Her mother-in-law has also moved in.  She has 2 grown sons.  She retired as a Magazine features editor.  The patient returns for an in person visit after about 9 months.  She states that lately "my mother-in-law is driving me crazy."  She states that her mother-in-law's talks nonstop whenever turn the TV off.  They disagree about many things and this is making the patient more agitated and nervous.  Her primary doctor put her on BuSpar which helped a little bit.  She is still taking the Zoloft.  She is mildly depressed but more aggravated than anything else.  I urged her to set limits with the mother-in-law and also try to find a program she can go to through the day for seniors.  The patient has not sleeping well so we will add a low-dose of trazodone.  She has a vestibular schwannoma and sometimes has dizzy spells that she needs to be careful about sedating medications.   Visit Diagnosis:    ICD-10-CM   1. Major depressive disorder, recurrent episode, moderate (HCC)  F33.1 sertraline (ZOLOFT) 100 MG tablet      Past Psychiatric History: Previous outpatient treatment  Past Medical History:  Past Medical History:  Diagnosis Date   Anxiety attack    Arthritis    Back pain, chronic    Chronic kidney disease    Crohn's colitis ([place])    Depression (emotion)    Dysrhythmia    GERD (gastroesophageal reflux disease)    Hypertension    Hypothyroid    Neuropathy    Sleep apnea    cpap is not working per patient - needs to find a new doctor - not used since 7/16 per pat    Stage 3 chronic kidney disease ([place])    Vertigo    followed by Dr Wardell Heath- in [place]     Past Surgical History:  Procedure Laterality Date    ABDOMINAL HYSTERECTOMY     BIOPSY  06/08/2018   Procedure: BIOPSY;  Surgeon: Rogene Houston, MD;  [facility];  Service: Endoscopy;;  (colon)   CHOLECYSTECTOMY     COLON SURGERY     for Crohn's in the 80s in [place]   COLONOSCOPY N/A 03/11/2013   Procedure: COLONOSCOPY;  Surgeon: Rogene Houston, MD;  [facility];  Service: Endoscopy;  Laterality: N/A;  1200   COLONOSCOPY N/A 06/08/2018   Procedure: COLONOSCOPY;  Surgeon: Rogene Houston, MD;  [facility];  Service: Endoscopy;  Laterality: N/A;  8:25   fatty tumor removed     HARDWARE REMOVAL Right 08/07/2016   Procedure: HARDWARE REMOVAL;  Surgeon: Gaynelle Arabian, MD;  [facility];  Service: Orthopedics;  Laterality: Right;   Hip replacement      Rt hip in 2010 in [place] ARTHROSCOPY Left 03/29/2015   Procedure: ARTHROSCOPY LEFT KNEE WITH MENSICAL DEBRIDEMENT, chondroplasty;  Surgeon: Gaynelle Arabian, MD;  [facility];  Service: Orthopedics;  Laterality: Left;   ORIF TIBIA PLATEAU Right 02/29/2016   Procedure: OPEN REDUCTION INTERNAL FIXATION (ORIF RIGHT  TIBIAL PLATEAU FRACTURE;  Surgeon: Gaynelle Arabian, MD;  [facility];  Service: Orthopedics;  Laterality: Right;   TEE WITHOUT CARDIOVERSION N/A 03/27/2021   Procedure: TRANSESOPHAGEAL ECHOCARDIOGRAM (TEE);  Surgeon: Arnoldo Lenis, MD;  [facility];  Service: Endoscopy;  Laterality: N/A;   TOTAL KNEE ARTHROPLASTY Right 08/25/2017   Procedure: TOTAL RIGHT KNEE ARTHROPLASTY;  Surgeon: Gaynelle Arabian, MD;  [facility];  Service: Orthopedics;  Laterality: Right;    Family Psychiatric History: See below  Family History:  Family History  Problem Relation Age of Onset   Crohn's disease Brother    Depression Mother    Anxiety disorder Mother    Depression Sister    Aneurysm Father    Colon cancer Neg Hx     Social History:  Social History   Socioeconomic History   Marital status: Married    Spouse name: Not  on file   Number of children: Not on file   Years of education: Not on file   Highest education level: Not on file  Occupational History   Not on file  Tobacco Use   Smoking status: Never   Smokeless tobacco: Never  Vaping Use   Vaping Use: Never used  Substance and Sexual Activity   Alcohol use: No    Alcohol/week: 0.0 standard drinks   Drug use: No   Sexual activity: Not Currently  Other Topics Concern   Not on file  Social History Narrative   Not on file   Social Determinants of Health   Financial Resource Strain: Not on file  Food Insecurity: Not on file  Transportation Needs: Not on file  Physical Activity: Not on file  Stress: Not on file  Social Connections: Not on file    Allergies:  Allergies  Allergen Reactions   Morphine And Related Hives and Itching    May cause blood pressure to drop   Robaxin [Methocarbamol] Itching   Sulfa Antibiotics Itching    ALL SULFA DRUGS    Metabolic Disorder Labs: No results found for: HGBA1C, MPG No results found for: PROLACTIN No results found for: CHOL, TRIG, HDL, CHOLHDL, VLDL, LDLCALC No results found for: TSH  Therapeutic Level Labs: No results found for: LITHIUM No results found for: VALPROATE No components found for:  CBMZ  Current Medications: Current Outpatient Medications  Medication Sig Dispense Refill   alendronate (FOSAMAX) 70 MG tablet Take 70 mg by mouth every Monday.     amLODipine (NORVASC) 5 MG tablet Take 5 mg by mouth daily.     Carboxymethylcellulose Sodium (RETAINE [facility] OP) Place 1 drop into both eyes every 6 (six) hours as needed (dry eyes).     dicyclomine (BENTYL) 10 MG capsule Take 1 capsule (10 mg total) by mouth 2 (two) times daily before a meal. 60 capsule 5   fluticasone (FLONASE) 50 MCG/ACT nasal spray Place 1 spray into the nose daily as needed for allergies or rhinitis.     hydrochlorothiazide (HYDRODIURIL) 12.5 MG tablet Take 12.5 mg by mouth daily.     HYDROcodone-acetaminophen  (NORCO/VICODIN) 5-325 MG tablet Take 1 tablet by mouth at bedtime.     levothyroxine (SYNTHROID) 75 MCG tablet Take 75 mcg by mouth daily before breakfast.     loperamide (IMODIUM) 2 MG capsule TAKE 1 CAPSULE BY MOUTH TWICE DAILY AS NEEDED FOR  DIARRHEA  OR  LOOSE  STOOLS 180 capsule 0   metoprolol succinate (TOPROL-XL) 25 MG 24 hr tablet Take 25 mg by mouth daily.     Multiple Vitamin (MULTIVITAMIN WITH MINERALS) TABS tablet Take 1 tablet by mouth  daily.     nystatin-triamcinolone (MYCOLOG II) cream Apply 1 application topically 2 (two) times daily. (Patient taking differently: Apply 1 application topically 2 (two) times daily as needed (rash).) 30 g 1   pantoprazole (PROTONIX) 40 MG tablet Take 1 tablet (40 mg total) by mouth daily before breakfast. (Patient taking differently: Take 40 mg by mouth daily as needed (acid reflux).) 90 tablet 3   potassium chloride (KLOR-CON) 10 MEQ tablet Take 20 mEq by mouth daily.     rOPINIRole (REQUIP) 0.5 MG tablet Take 0.5 mg by mouth in the morning and at bedtime.     rosuvastatin (CRESTOR) 5 MG tablet Take 5 mg by mouth daily.     traZODone (DESYREL) 50 MG tablet Take 1 tablet (50 mg total) by mouth at bedtime. 30 tablet 2   busPIRone (BUSPAR) 5 MG tablet Take 1 tablet (5 mg total) by mouth 3 (three) times daily. 90 tablet 2   sertraline (ZOLOFT) 100 MG tablet Take 1.5 tablets (150 mg total) by mouth daily. 135 tablet 3   No current facility-administered medications for this visit.     Musculoskeletal: Strength & Muscle Tone: within normal limits Gait & Station: normal Patient leans: N/A  Psychiatric Specialty Exam: Review of Systems  Musculoskeletal:  Positive for back pain.  Neurological:  Positive for dizziness.  Psychiatric/Behavioral:  Positive for sleep disturbance. The patient is nervous/anxious.   All other systems reviewed and are negative.  Blood pressure (!) 146/79, pulse 74, temperature (!) 97.3 F (36.3 C), temperature source  Temporal, height 5' 2"  (1.575 m), weight 164 lb (74.4 kg), SpO2 97 %.Body mass index is 30 kg/m.  General Appearance: Casual and Fairly Groomed  Eye Contact:  Good  Speech:  Clear and Coherent  Volume:  Normal  Mood:  Anxious and Dysphoric  Affect:  Appropriate and Congruent  Thought Process:  Goal Directed  Orientation:  Full (Time, Place, and Person)  Thought Content: Rumination   Suicidal Thoughts:  No  Homicidal Thoughts:  No  Memory:  Immediate;   Good Recent;   Good Remote;   Good  Judgement:  Good  Insight:  Fair  Psychomotor Activity:  Decreased  Concentration:  Concentration: Fair and Attention Span: Fair  Recall:  Good  Fund of Knowledge: Good  Language: Good  Akathisia:  No  Handed:  Right  AIMS (if indicated): not done  Assets:  Communication Skills Desire for Improvement Resilience Social Support Talents/Skills  ADL's:  Intact  Cognition: WNL  Sleep:  Poor   Screenings: GAD-7    Flowsheet Row Office Visit from 08/23/2021 in [facility]  Total GAD-7 Score 14      PHQ2-9    [place] Office Visit from 08/23/2021 in [facility] Video Visit from 12/22/2020 in [facility]  PHQ-2 Total Score 1 2  PHQ-9 Total Score 6 6      [place] Office Visit from 08/23/2021 in [facility] Admission (Discharged) from 03/27/2021 in [facility] from 03/22/2021 in [facility] No Risk No Risk No Risk        Assessment and Plan: This patient is a 71 year old female with a history of depression and anxiety.  She is having more stress dealing with her mother-in-law.  She is agreeable to starting therapy to help with this.  She will continue Zoloft 150 mg daily.  We will continue  the BuSpar as directed by primary care to 5 mg 3  times daily and add trazodone 50 mg as needed for sleep at bedtime.  She will return to see me in 42-month Collaboration of Care: Collaboration of Care: Referral or follow-up with counselor/therapist AEB referral for therapy in our office  Patient/Guardian was advised Release of Information must be obtained prior to any record release in order to collaborate their care with an outside provider. Patient/Guardian was advised if they have not already done so to contact the registration department to sign all necessary forms in order for [facility] release information regarding their care.   Consent: Patient/Guardian gives verbal consent for treatment and assignment of benefits for services provided during this visit. Patient/Guardian expressed understanding and agreed to proceed.    DLevonne Spiller MD 08/23/2021, 11:59 AM

## 2021-08-31 DIAGNOSIS — Z20822 Contact with and (suspected) exposure to covid-19: Secondary | ICD-10-CM | POA: Diagnosis not present

## 2021-09-21 DIAGNOSIS — G319 Degenerative disease of nervous system, unspecified: Secondary | ICD-10-CM | POA: Diagnosis not present

## 2021-09-21 DIAGNOSIS — M5430 Sciatica, unspecified side: Secondary | ICD-10-CM | POA: Diagnosis not present

## 2021-09-21 DIAGNOSIS — I10 Essential (primary) hypertension: Secondary | ICD-10-CM | POA: Diagnosis not present

## 2021-09-21 DIAGNOSIS — Z299 Encounter for prophylactic measures, unspecified: Secondary | ICD-10-CM | POA: Diagnosis not present

## 2021-09-21 DIAGNOSIS — K5010 Crohn's disease of large intestine without complications: Secondary | ICD-10-CM | POA: Diagnosis not present

## 2021-09-26 DIAGNOSIS — M545 Low back pain, unspecified: Secondary | ICD-10-CM | POA: Diagnosis not present

## 2021-09-26 DIAGNOSIS — G2581 Restless legs syndrome: Secondary | ICD-10-CM | POA: Diagnosis not present

## 2021-09-26 DIAGNOSIS — R269 Unspecified abnormalities of gait and mobility: Secondary | ICD-10-CM | POA: Diagnosis not present

## 2021-09-26 DIAGNOSIS — G603 Idiopathic progressive neuropathy: Secondary | ICD-10-CM | POA: Diagnosis not present

## 2021-09-26 DIAGNOSIS — Z79899 Other long term (current) drug therapy: Secondary | ICD-10-CM | POA: Diagnosis not present

## 2021-09-28 DIAGNOSIS — Z20822 Contact with and (suspected) exposure to covid-19: Secondary | ICD-10-CM | POA: Diagnosis not present

## 2021-10-01 DIAGNOSIS — Z20828 Contact with and (suspected) exposure to other viral communicable diseases: Secondary | ICD-10-CM | POA: Diagnosis not present

## 2021-10-03 DIAGNOSIS — Z20822 Contact with and (suspected) exposure to covid-19: Secondary | ICD-10-CM | POA: Diagnosis not present

## 2021-10-09 DIAGNOSIS — S51812A Laceration without foreign body of left forearm, initial encounter: Secondary | ICD-10-CM | POA: Diagnosis not present

## 2021-10-09 DIAGNOSIS — L570 Actinic keratosis: Secondary | ICD-10-CM | POA: Diagnosis not present

## 2021-10-09 DIAGNOSIS — L910 Hypertrophic scar: Secondary | ICD-10-CM | POA: Diagnosis not present

## 2021-10-15 DIAGNOSIS — Z20822 Contact with and (suspected) exposure to covid-19: Secondary | ICD-10-CM | POA: Diagnosis not present

## 2021-10-20 DIAGNOSIS — R079 Chest pain, unspecified: Secondary | ICD-10-CM | POA: Diagnosis not present

## 2021-10-24 DIAGNOSIS — Z299 Encounter for prophylactic measures, unspecified: Secondary | ICD-10-CM | POA: Diagnosis not present

## 2021-10-24 DIAGNOSIS — Z6831 Body mass index (BMI) 31.0-31.9, adult: Secondary | ICD-10-CM | POA: Diagnosis not present

## 2021-10-24 DIAGNOSIS — Z79899 Other long term (current) drug therapy: Secondary | ICD-10-CM | POA: Diagnosis not present

## 2021-10-24 DIAGNOSIS — R0600 Dyspnea, unspecified: Secondary | ICD-10-CM | POA: Diagnosis not present

## 2021-10-24 DIAGNOSIS — I10 Essential (primary) hypertension: Secondary | ICD-10-CM | POA: Diagnosis not present

## 2021-10-24 DIAGNOSIS — R5383 Other fatigue: Secondary | ICD-10-CM | POA: Diagnosis not present

## 2021-10-25 ENCOUNTER — Telehealth: Payer: Self-pay | Admitting: Cardiology

## 2021-10-25 NOTE — Telephone Encounter (Signed)
Pt c/o of Chest Pain: STAT if CP now or developed within 24 hours ? ?1. Are you having CP right now? No ? ?2. Are you experiencing any other symptoms (ex. SOB, nausea, vomiting, sweating)? No ? ?3. How long have you been experiencing CP? 2-3 months ? ?4. Is your CP continuous or coming and going? Coming and going ? ?5. Have you taken Nitroglycerin? No ? ?Pt states that she has a "pounding feeling that has been going on. She says that it's to the point to where it's hard for her to move around. Pt states that she has a heart murmur and a leaky val. Pt's states that PCP told her she need to see her heart doctor asap. Pt has appt with Dr. Harl Bowie on 4/28. Please advise ??  ?

## 2021-10-25 NOTE — Telephone Encounter (Signed)
States that she is having intermittent elevated and pounding heartbeat x 3 months. Is having intermittent fatigue, vomiting, nausea and headaches x 2 weeks ?Denies pain, sob. Taking all medications as prescribed. ?Saw pcp yesterday and was told to reach out to her cardiologist asap because this could be coming from a leaky valve. ?BP was elevated yesterday at PCP, unsure of the actual reading due to problems concentrating. ?Patient advised to have someone take her to ER or call 911 with new or worsening symptoms. ?Verbalized understanding. ?Please advise ?

## 2021-10-29 DIAGNOSIS — Z20822 Contact with and (suspected) exposure to covid-19: Secondary | ICD-10-CM | POA: Diagnosis not present

## 2021-10-29 NOTE — Telephone Encounter (Signed)
Leaky heart valve would not cause any of those symptoms (headache, nausea, vomiting, heart pounding). Something else is going on. Was she seen in the ER, I see she has appt with me later this week if symptoms mild could evaluate at that time ? ?Zandra Abts MD ?

## 2021-10-30 DIAGNOSIS — Z20822 Contact with and (suspected) exposure to covid-19: Secondary | ICD-10-CM | POA: Diagnosis not present

## 2021-10-31 NOTE — Telephone Encounter (Signed)
Patient made aware via MyChart. ?

## 2021-11-02 ENCOUNTER — Ambulatory Visit (INDEPENDENT_AMBULATORY_CARE_PROVIDER_SITE_OTHER): Payer: Medicare Other

## 2021-11-02 ENCOUNTER — Other Ambulatory Visit: Payer: Self-pay | Admitting: Cardiology

## 2021-11-02 ENCOUNTER — Encounter: Payer: Self-pay | Admitting: Cardiology

## 2021-11-02 ENCOUNTER — Encounter: Payer: Self-pay | Admitting: *Deleted

## 2021-11-02 ENCOUNTER — Ambulatory Visit (INDEPENDENT_AMBULATORY_CARE_PROVIDER_SITE_OTHER): Payer: Medicare Other | Admitting: Cardiology

## 2021-11-02 ENCOUNTER — Telehealth: Payer: Self-pay | Admitting: Cardiology

## 2021-11-02 VITALS — BP 138/74 | HR 84 | Ht 62.0 in | Wt 170.0 lb

## 2021-11-02 DIAGNOSIS — R002 Palpitations: Secondary | ICD-10-CM | POA: Diagnosis not present

## 2021-11-02 DIAGNOSIS — I340 Nonrheumatic mitral (valve) insufficiency: Secondary | ICD-10-CM

## 2021-11-02 NOTE — Progress Notes (Signed)
? ? ? ?Clinical Summary ?Jessica [name] is a 71 y.o.female seen today for a focused visit on recent symptoms of palpitations and SOB.  ? ? ?1.Palpitations ?- symptoms started about 2-3 weeks ago ?- can occur at rest or with activity ?- feeling of heart pounding. No other associated symptoms. Symptoms on and off throughout the day. ?- no coffee, rare sodas, occasional tea, no energy drinks, no EtOH ?- recent family stress ? ? ?2. Mitral regurgitation ?- severe MR from pcp note ?  ?02/2021 echo at [facility]: LVEF >55%, severe MR. LVIDs 2.6 ? 03/2021 TEE: moderate MR ?12/2020 TSH 8.96 Hgb 13 Plt 169 TSH 8.96 ?  ?- report some increase in her chronic SOB.  ?  ?2. SOB ?- SOB improving since last visit ?- ongoing generalized fatigue.  ? ?- progression  ?  ? ? ?Other medical issues not addressed this visit ? ? ?3.OSA ?- followed by Dr Elsworth Soho ?- mixed compliance with cpap ?  ?  ?4. CKD ?- followed by Dr Theador Hawthorne ?  ?5.Hypothyroidism ?- last TSH was elevated from 12/2020, perhaps playing a role in her fatigue ?  ?  ?6. Chronic fatigue ? ? 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  ?Past Medical History:  ?Diagnosis Date  ? Anxiety attack   ? Arthritis   ? Back pain, chronic   ? Chronic kidney disease   ? Crohn's colitis ([place])   ? Depression (emotion)   ? Dysrhythmia   ? GERD (gastroesophageal reflux disease)   ? Hypertension   ? Hypothyroid   ? Neuropathy   ? Sleep apnea   ? cpap is not working per patient - needs to find a new doctor - not used since 7/16 per pat   ? Stage 3 chronic kidney disease ([place])   ? Vertigo   ? followed by Dr Wardell Heath- in [place]   ? ? ? ?Allergies  ?Allergen Reactions  ? Morphine And Related Hives and Itching  ?  May cause blood pressure to drop  ? Robaxin [Methocarbamol] Itching  ? Sulfa Antibiotics Itching  ?  ALL SULFA DRUGS  ? ? ? ?Current Outpatient Medications  ?Medication Sig Dispense Refill  ? alendronate (FOSAMAX) 70 MG tablet Take 70 mg by mouth every  Monday.    ? amLODipine (NORVASC) 5 MG tablet Take 5 mg by mouth daily.    ? busPIRone (BUSPAR) 5 MG tablet Take 1 tablet (5 mg total) by mouth 3 (three) times daily. 90 tablet 2  ? Carboxymethylcellulose Sodium (RETAINE CMC OP) Place 1 drop into both eyes every 6 (six) hours as needed (dry eyes).    ? dicyclomine (BENTYL) 10 MG capsule Take 1 capsule (10 mg total) by mouth 2 (two) times daily before a meal. 60 capsule 5  ? fluticasone (FLONASE) 50 MCG/ACT nasal spray Place 1 spray into the nose daily as needed for allergies or rhinitis.    ? hydrochlorothiazide (HYDRODIURIL) 12.5 MG tablet Take 12.5 mg by mouth daily.    ? HYDROcodone-acetaminophen (NORCO/VICODIN) 5-325 MG tablet Take 1 tablet by mouth at bedtime.    ? levothyroxine (SYNTHROID) 75 MCG tablet Take 75 mcg by mouth daily before breakfast.    ? loperamide (IMODIUM) 2 MG capsule TAKE 1 CAPSULE BY MOUTH TWICE DAILY AS NEEDED FOR  DIARRHEA  OR  LOOSE  STOOLS 180 capsule 0  ? metoprolol succinate (TOPROL-XL) 25 MG 24 hr tablet Take 25 mg by mouth daily.    ?  Multiple Vitamin (MULTIVITAMIN WITH MINERALS) TABS tablet Take 1 tablet by mouth daily.    ? nystatin-triamcinolone (MYCOLOG II) cream Apply 1 application topically 2 (two) times daily. (Patient taking differently: Apply 1 application topically 2 (two) times daily as needed (rash).) 30 g 1  ? pantoprazole (PROTONIX) 40 MG tablet Take 1 tablet (40 mg total) by mouth daily before breakfast. (Patient taking differently: Take 40 mg by mouth daily as needed (acid reflux).) 90 tablet 3  ? potassium chloride (KLOR-CON) 10 MEQ tablet Take 20 mEq by mouth daily.    ? rOPINIRole (REQUIP) 0.5 MG tablet Take 0.5 mg by mouth in the morning and at bedtime.    ? rosuvastatin (CRESTOR) 5 MG tablet Take 5 mg by mouth daily.    ? sertraline (ZOLOFT) 100 MG tablet Take 1.5 tablets (150 mg total) by mouth daily. 135 tablet 3  ? traZODone (DESYREL) 50 MG tablet Take 1 tablet (50 mg total) by mouth at bedtime. 30 tablet 2   ? ?No current facility-administered medications for this visit.  ? ? ? ?Past Surgical History:  ?Procedure Laterality Date  ? ABDOMINAL HYSTERECTOMY    ? BIOPSY  06/08/2018  ? Procedure: BIOPSY;  Surgeon: Rogene Houston, MD;  [facility];  Service: Endoscopy;;  (colon)  ? CHOLECYSTECTOMY    ? COLON SURGERY    ? for Crohn's in the 55s in [place]  ? COLONOSCOPY N/A 03/11/2013  ? Procedure: COLONOSCOPY;  Surgeon: Rogene Houston, MD;  [facility];  Service: Endoscopy;  Laterality: N/A;  1200  ? COLONOSCOPY N/A 06/08/2018  ? Procedure: COLONOSCOPY;  Surgeon: Rogene Houston, MD;  [facility];  Service: Endoscopy;  Laterality: N/A;  8:25  ? fatty tumor removed    ? HARDWARE REMOVAL Right 08/07/2016  ? Procedure: HARDWARE REMOVAL;  Surgeon: Gaynelle Arabian, MD;  [facility];  Service: Orthopedics;  Laterality: Right;  ? Hip replacement     ? Rt hip in 2010 in [place]  ? KNEE ARTHROSCOPY Left 03/29/2015  ? Procedure: ARTHROSCOPY LEFT KNEE WITH MENSICAL DEBRIDEMENT, chondroplasty;  Surgeon: Gaynelle Arabian, MD;  [facility];  Service: Orthopedics;  Laterality: Left;  ? ORIF TIBIA PLATEAU Right 02/29/2016  ? Procedure: OPEN REDUCTION INTERNAL FIXATION (ORIF RIGHT  TIBIAL PLATEAU FRACTURE;  Surgeon: Gaynelle Arabian, MD;  [facility];  Service: Orthopedics;  Laterality: Right;  ? TEE WITHOUT CARDIOVERSION N/A 03/27/2021  ? Procedure: TRANSESOPHAGEAL ECHOCARDIOGRAM (TEE);  Surgeon: Arnoldo Lenis, MD;  [facility];  Service: Endoscopy;  Laterality: N/A;  ? TOTAL KNEE ARTHROPLASTY Right 08/25/2017  ? Procedure: TOTAL RIGHT KNEE ARTHROPLASTY;  Surgeon: Gaynelle Arabian, MD;  [facility];  Service: Orthopedics;  Laterality: Right;  ? ? ? ?Allergies  ?Allergen Reactions  ? Morphine And Related Hives and Itching  ?  May cause blood pressure to drop  ? Robaxin [Methocarbamol] Itching  ? Sulfa Antibiotics Itching  ?  ALL SULFA DRUGS  ? ? ? ? ?Family History  ?Problem  Relation Age of Onset  ? Crohn's disease Brother   ? Depression Mother   ? Anxiety disorder Mother   ? Depression Sister   ? Aneurysm Father   ? Colon cancer Neg Hx   ? ? ? ?Social History ?Ms. Cabanilla reports that she has never smoked. She has never used smokeless tobacco. ?Ms. Smelcer reports no history of alcohol use. ? ? ?Review of Systems ?CONSTITUTIONAL: No weight loss, fever, chills, weakness  or fatigue.  ?HEENT: Eyes: No visual loss, blurred vision, double vision or yellow sclerae.No hearing loss, sneezing, congestion, runny nose or sore throat.  ?SKIN: No rash or itching.  ?CARDIOVASCULAR: per hpi ?RESPIRATORY: No shortness of breath, cough or sputum.  ?GASTROINTESTINAL: No anorexia, nausea, vomiting or diarrhea. No abdominal pain or blood.  ?GENITOURINARY: No burning on urination, no polyuria ?NEUROLOGICAL: No headache, dizziness, syncope, paralysis, ataxia, numbness or tingling in the extremities. No change in bowel or bladder control.  ?MUSCULOSKELETAL: No muscle, back pain, joint pain or stiffness.  ?LYMPHATICS: No enlarged nodes. No history of splenectomy.  ?PSYCHIATRIC: No history of depression or anxiety.  ?ENDOCRINOLOGIC: No reports of sweating, cold or heat intolerance. No polyuria or polydipsia.  ?. ? ? ?Physical Examination ?Today's Vitals  ? 11/02/21 1005  ?BP: 138/74  ?Pulse: 84  ?SpO2: 99%  ?Weight: 170 lb (77.1 kg)  ?Height: 5' 2"  (1.575 m)  ? ?Body mass index is 31.09 kg/m?. ? ?Gen: resting comfortably, no acute distress ?HEENT: no scleral icterus, pupils equal round and reactive, no palptable cervical adenopathy,  ?CV: RRR, 2/6 systolic murmur apex, no jvd ?Resp: Clear to auscultation bilaterally ?GI: abdomen is soft, non-tender, non-distended, normal bowel sounds, no hepatosplenomegaly ?MSK: extremities are warm, no edema.  ?Skin: warm, no rash ?Neuro:  no focal deficits ?Psych: appropriate affect ? ? ?Diagnostic Studies ? ?03/2021 echo ?IMPRESSIONS  ? ? ? 1. Left ventricular ejection  fraction, by estimation, is 60 to 65%. The  ?left ventricle has normal function. The left ventricle has no regional  ?wall motion abnormalities.  ? 2. Right ventricular systolic function is normal. The right ventricula

## 2021-11-02 NOTE — Telephone Encounter (Signed)
PERCERT: ? ?LONG TERM MONITOR  ?

## 2021-11-02 NOTE — Patient Instructions (Addendum)
Medication Instructions:  ?Continue all current medications. ? ?Labwork: ?none ? ?Testing/Procedures: ?Your physician has requested that you have an echocardiogram. Echocardiography is a painless test that uses sound waves to create images of your heart. It provides your doctor with information about the size and shape of your heart and how well your heart?s chambers and valves are working. This procedure takes approximately one hour. There are no restrictions for this procedure.  ?Your physician has recommended that you wear a 3 day event monitor. Event monitors are medical devices that record the heart?s electrical activity. Doctors most often [place] these monitors to diagnose arrhythmias. Arrhythmias are problems with the speed or rhythm of the heartbeat. The monitor is a small, portable device. You can wear one while you do your normal daily activities. This is usually used to diagnose what is causing palpitations/syncope (passing out). ?Office will contact with results via phone or letter.    ? ?Follow-Up: ?4 months  ? ?Any Other Special Instructions Will Be Listed Below (If Applicable). ? ? ?If you need a refill on your cardiac medications before your next appointment, please call your pharmacy. ? ?

## 2021-11-09 ENCOUNTER — Other Ambulatory Visit (INDEPENDENT_AMBULATORY_CARE_PROVIDER_SITE_OTHER): Payer: Self-pay | Admitting: Internal Medicine

## 2021-11-09 DIAGNOSIS — R002 Palpitations: Secondary | ICD-10-CM | POA: Diagnosis not present

## 2021-11-12 NOTE — Telephone Encounter (Signed)
01/30/2021 last seen. ?

## 2021-11-19 DIAGNOSIS — R002 Palpitations: Secondary | ICD-10-CM | POA: Diagnosis not present

## 2021-11-20 ENCOUNTER — Ambulatory Visit (HOSPITAL_COMMUNITY): Payer: Medicare Other | Admitting: Psychiatry

## 2021-11-27 DIAGNOSIS — M5412 Radiculopathy, cervical region: Secondary | ICD-10-CM | POA: Diagnosis not present

## 2021-11-27 DIAGNOSIS — G2581 Restless legs syndrome: Secondary | ICD-10-CM | POA: Diagnosis not present

## 2021-11-27 DIAGNOSIS — I7300 Raynaud's syndrome without gangrene: Secondary | ICD-10-CM | POA: Diagnosis not present

## 2021-11-27 DIAGNOSIS — G603 Idiopathic progressive neuropathy: Secondary | ICD-10-CM | POA: Diagnosis not present

## 2021-11-27 DIAGNOSIS — M5417 Radiculopathy, lumbosacral region: Secondary | ICD-10-CM | POA: Diagnosis not present

## 2021-11-27 DIAGNOSIS — Z79899 Other long term (current) drug therapy: Secondary | ICD-10-CM | POA: Diagnosis not present

## 2021-11-28 DIAGNOSIS — E211 Secondary hyperparathyroidism, not elsewhere classified: Secondary | ICD-10-CM | POA: Diagnosis not present

## 2021-11-28 DIAGNOSIS — Z79899 Other long term (current) drug therapy: Secondary | ICD-10-CM | POA: Diagnosis not present

## 2021-11-28 DIAGNOSIS — E559 Vitamin D deficiency, unspecified: Secondary | ICD-10-CM | POA: Diagnosis not present

## 2021-11-28 DIAGNOSIS — I129 Hypertensive chronic kidney disease with stage 1 through stage 4 chronic kidney disease, or unspecified chronic kidney disease: Secondary | ICD-10-CM | POA: Diagnosis not present

## 2021-11-28 DIAGNOSIS — D638 Anemia in other chronic diseases classified elsewhere: Secondary | ICD-10-CM | POA: Diagnosis not present

## 2021-11-28 DIAGNOSIS — E876 Hypokalemia: Secondary | ICD-10-CM | POA: Diagnosis not present

## 2021-11-28 DIAGNOSIS — N1831 Chronic kidney disease, stage 3a: Secondary | ICD-10-CM | POA: Diagnosis not present

## 2021-11-28 DIAGNOSIS — E870 Hyperosmolality and hypernatremia: Secondary | ICD-10-CM | POA: Diagnosis not present

## 2021-11-30 DIAGNOSIS — M7061 Trochanteric bursitis, right hip: Secondary | ICD-10-CM | POA: Diagnosis not present

## 2021-11-30 DIAGNOSIS — Z96651 Presence of right artificial knee joint: Secondary | ICD-10-CM | POA: Diagnosis not present

## 2021-11-30 DIAGNOSIS — Z96641 Presence of right artificial hip joint: Secondary | ICD-10-CM | POA: Diagnosis not present

## 2021-12-04 DIAGNOSIS — I10 Essential (primary) hypertension: Secondary | ICD-10-CM | POA: Diagnosis not present

## 2021-12-08 DIAGNOSIS — R600 Localized edema: Secondary | ICD-10-CM | POA: Diagnosis not present

## 2021-12-08 DIAGNOSIS — D638 Anemia in other chronic diseases classified elsewhere: Secondary | ICD-10-CM | POA: Diagnosis not present

## 2021-12-08 DIAGNOSIS — E211 Secondary hyperparathyroidism, not elsewhere classified: Secondary | ICD-10-CM | POA: Diagnosis not present

## 2021-12-08 DIAGNOSIS — E876 Hypokalemia: Secondary | ICD-10-CM | POA: Diagnosis not present

## 2021-12-08 DIAGNOSIS — I129 Hypertensive chronic kidney disease with stage 1 through stage 4 chronic kidney disease, or unspecified chronic kidney disease: Secondary | ICD-10-CM | POA: Diagnosis not present

## 2021-12-08 DIAGNOSIS — N1831 Chronic kidney disease, stage 3a: Secondary | ICD-10-CM | POA: Diagnosis not present

## 2021-12-11 DIAGNOSIS — I10 Essential (primary) hypertension: Secondary | ICD-10-CM | POA: Diagnosis not present

## 2021-12-11 DIAGNOSIS — Z299 Encounter for prophylactic measures, unspecified: Secondary | ICD-10-CM | POA: Diagnosis not present

## 2021-12-11 DIAGNOSIS — Z7189 Other specified counseling: Secondary | ICD-10-CM | POA: Diagnosis not present

## 2021-12-11 DIAGNOSIS — Z0000 Encounter for general adult medical examination without abnormal findings: Secondary | ICD-10-CM | POA: Diagnosis not present

## 2021-12-11 DIAGNOSIS — Z6831 Body mass index (BMI) 31.0-31.9, adult: Secondary | ICD-10-CM | POA: Diagnosis not present

## 2021-12-11 DIAGNOSIS — E559 Vitamin D deficiency, unspecified: Secondary | ICD-10-CM | POA: Diagnosis not present

## 2021-12-11 DIAGNOSIS — R5383 Other fatigue: Secondary | ICD-10-CM | POA: Diagnosis not present

## 2021-12-11 DIAGNOSIS — Z79899 Other long term (current) drug therapy: Secondary | ICD-10-CM | POA: Diagnosis not present

## 2021-12-11 DIAGNOSIS — Z1339 Encounter for screening examination for other mental health and behavioral disorders: Secondary | ICD-10-CM | POA: Diagnosis not present

## 2021-12-11 DIAGNOSIS — E7800 Pure hypercholesterolemia, unspecified: Secondary | ICD-10-CM | POA: Diagnosis not present

## 2021-12-11 DIAGNOSIS — E039 Hypothyroidism, unspecified: Secondary | ICD-10-CM | POA: Diagnosis not present

## 2021-12-11 DIAGNOSIS — Z1331 Encounter for screening for depression: Secondary | ICD-10-CM | POA: Diagnosis not present

## 2021-12-14 DIAGNOSIS — R609 Edema, unspecified: Secondary | ICD-10-CM | POA: Diagnosis not present

## 2021-12-14 DIAGNOSIS — T07XXXA Unspecified multiple injuries, initial encounter: Secondary | ICD-10-CM | POA: Diagnosis not present

## 2021-12-14 DIAGNOSIS — I10 Essential (primary) hypertension: Secondary | ICD-10-CM | POA: Diagnosis not present

## 2021-12-14 DIAGNOSIS — Z299 Encounter for prophylactic measures, unspecified: Secondary | ICD-10-CM | POA: Diagnosis not present

## 2021-12-14 DIAGNOSIS — Z789 Other specified health status: Secondary | ICD-10-CM | POA: Diagnosis not present

## 2021-12-17 DIAGNOSIS — E876 Hypokalemia: Secondary | ICD-10-CM | POA: Diagnosis not present

## 2021-12-17 DIAGNOSIS — I129 Hypertensive chronic kidney disease with stage 1 through stage 4 chronic kidney disease, or unspecified chronic kidney disease: Secondary | ICD-10-CM | POA: Diagnosis not present

## 2021-12-17 DIAGNOSIS — E211 Secondary hyperparathyroidism, not elsewhere classified: Secondary | ICD-10-CM | POA: Diagnosis not present

## 2021-12-17 DIAGNOSIS — D638 Anemia in other chronic diseases classified elsewhere: Secondary | ICD-10-CM | POA: Diagnosis not present

## 2021-12-17 DIAGNOSIS — N1831 Chronic kidney disease, stage 3a: Secondary | ICD-10-CM | POA: Diagnosis not present

## 2021-12-19 ENCOUNTER — Ambulatory Visit (INDEPENDENT_AMBULATORY_CARE_PROVIDER_SITE_OTHER): Payer: Medicare Other

## 2021-12-19 DIAGNOSIS — I340 Nonrheumatic mitral (valve) insufficiency: Secondary | ICD-10-CM | POA: Diagnosis not present

## 2021-12-19 LAB — ECHOCARDIOGRAM COMPLETE
AR max vel: 1.83 cm2
AV Area VTI: 2.01 cm2
AV Area mean vel: 1.89 cm2
AV Mean grad: 5.1 mmHg
AV Peak grad: 10.6 mmHg
Ao pk vel: 1.63 m/s
Area-P 1/2: 3.88 cm2
Calc EF: 64.1 %
MV M vel: 5.89 m/s
MV Peak grad: 138.9 mmHg
Radius: 0.43 cm
S' Lateral: 3.09 cm
Single Plane A2C EF: 68.8 %
Single Plane A4C EF: 60.8 %

## 2021-12-20 DIAGNOSIS — R2689 Other abnormalities of gait and mobility: Secondary | ICD-10-CM | POA: Diagnosis not present

## 2021-12-20 DIAGNOSIS — Z471 Aftercare following joint replacement surgery: Secondary | ICD-10-CM | POA: Diagnosis not present

## 2021-12-20 DIAGNOSIS — M6281 Muscle weakness (generalized): Secondary | ICD-10-CM | POA: Diagnosis not present

## 2021-12-20 DIAGNOSIS — Z96651 Presence of right artificial knee joint: Secondary | ICD-10-CM | POA: Diagnosis not present

## 2021-12-21 DIAGNOSIS — R2689 Other abnormalities of gait and mobility: Secondary | ICD-10-CM | POA: Diagnosis not present

## 2021-12-21 DIAGNOSIS — Z96651 Presence of right artificial knee joint: Secondary | ICD-10-CM | POA: Diagnosis not present

## 2021-12-21 DIAGNOSIS — M6281 Muscle weakness (generalized): Secondary | ICD-10-CM | POA: Diagnosis not present

## 2021-12-21 DIAGNOSIS — Z471 Aftercare following joint replacement surgery: Secondary | ICD-10-CM | POA: Diagnosis not present

## 2021-12-24 DIAGNOSIS — Z79899 Other long term (current) drug therapy: Secondary | ICD-10-CM | POA: Diagnosis not present

## 2021-12-24 DIAGNOSIS — M6281 Muscle weakness (generalized): Secondary | ICD-10-CM | POA: Diagnosis not present

## 2021-12-24 DIAGNOSIS — R2689 Other abnormalities of gait and mobility: Secondary | ICD-10-CM | POA: Diagnosis not present

## 2021-12-24 DIAGNOSIS — M859 Disorder of bone density and structure, unspecified: Secondary | ICD-10-CM | POA: Diagnosis not present

## 2021-12-24 DIAGNOSIS — E2839 Other primary ovarian failure: Secondary | ICD-10-CM | POA: Diagnosis not present

## 2021-12-24 DIAGNOSIS — M818 Other osteoporosis without current pathological fracture: Secondary | ICD-10-CM | POA: Diagnosis not present

## 2021-12-24 DIAGNOSIS — Z471 Aftercare following joint replacement surgery: Secondary | ICD-10-CM | POA: Diagnosis not present

## 2021-12-24 DIAGNOSIS — Z96651 Presence of right artificial knee joint: Secondary | ICD-10-CM | POA: Diagnosis not present

## 2021-12-26 DIAGNOSIS — Z96651 Presence of right artificial knee joint: Secondary | ICD-10-CM | POA: Diagnosis not present

## 2021-12-26 DIAGNOSIS — Z471 Aftercare following joint replacement surgery: Secondary | ICD-10-CM | POA: Diagnosis not present

## 2021-12-26 DIAGNOSIS — R2689 Other abnormalities of gait and mobility: Secondary | ICD-10-CM | POA: Diagnosis not present

## 2021-12-26 DIAGNOSIS — M6281 Muscle weakness (generalized): Secondary | ICD-10-CM | POA: Diagnosis not present

## 2021-12-28 DIAGNOSIS — H524 Presbyopia: Secondary | ICD-10-CM | POA: Diagnosis not present

## 2021-12-28 DIAGNOSIS — H3540 Unspecified peripheral retinal degeneration: Secondary | ICD-10-CM | POA: Diagnosis not present

## 2021-12-31 DIAGNOSIS — Z471 Aftercare following joint replacement surgery: Secondary | ICD-10-CM | POA: Diagnosis not present

## 2021-12-31 DIAGNOSIS — Z96651 Presence of right artificial knee joint: Secondary | ICD-10-CM | POA: Diagnosis not present

## 2021-12-31 DIAGNOSIS — M6281 Muscle weakness (generalized): Secondary | ICD-10-CM | POA: Diagnosis not present

## 2021-12-31 DIAGNOSIS — R2689 Other abnormalities of gait and mobility: Secondary | ICD-10-CM | POA: Diagnosis not present

## 2022-01-01 DIAGNOSIS — M810 Age-related osteoporosis without current pathological fracture: Secondary | ICD-10-CM | POA: Diagnosis not present

## 2022-01-01 DIAGNOSIS — Z789 Other specified health status: Secondary | ICD-10-CM | POA: Diagnosis not present

## 2022-01-01 DIAGNOSIS — Z299 Encounter for prophylactic measures, unspecified: Secondary | ICD-10-CM | POA: Diagnosis not present

## 2022-01-01 DIAGNOSIS — S41119A Laceration without foreign body of unspecified upper arm, initial encounter: Secondary | ICD-10-CM | POA: Diagnosis not present

## 2022-01-01 DIAGNOSIS — W19XXXA Unspecified fall, initial encounter: Secondary | ICD-10-CM | POA: Diagnosis not present

## 2022-01-01 DIAGNOSIS — I10 Essential (primary) hypertension: Secondary | ICD-10-CM | POA: Diagnosis not present

## 2022-01-02 ENCOUNTER — Ambulatory Visit (INDEPENDENT_AMBULATORY_CARE_PROVIDER_SITE_OTHER): Payer: Medicare Other | Admitting: Pulmonary Disease

## 2022-01-02 ENCOUNTER — Encounter: Payer: Self-pay | Admitting: Pulmonary Disease

## 2022-01-02 DIAGNOSIS — M6281 Muscle weakness (generalized): Secondary | ICD-10-CM | POA: Diagnosis not present

## 2022-01-02 DIAGNOSIS — R0609 Other forms of dyspnea: Secondary | ICD-10-CM

## 2022-01-02 DIAGNOSIS — G4733 Obstructive sleep apnea (adult) (pediatric): Secondary | ICD-10-CM | POA: Diagnosis not present

## 2022-01-02 DIAGNOSIS — R2689 Other abnormalities of gait and mobility: Secondary | ICD-10-CM | POA: Diagnosis not present

## 2022-01-02 DIAGNOSIS — Z471 Aftercare following joint replacement surgery: Secondary | ICD-10-CM | POA: Diagnosis not present

## 2022-01-02 DIAGNOSIS — Z96651 Presence of right artificial knee joint: Secondary | ICD-10-CM | POA: Diagnosis not present

## 2022-01-02 NOTE — Patient Instructions (Signed)
  X Call [phone] for mask fitting appt

## 2022-01-02 NOTE — Assessment & Plan Note (Signed)
Related to MR and deconditioning

## 2022-01-02 NOTE — Assessment & Plan Note (Signed)
CPAP supplies will be renewed for a year.  CPAP download was reviewed which shows good control of events on auto settings 4 to 13 cm.  Leak is better with a fullface mask and the last 2 nights.  I have asked her to schedule mask fitting session to see if we can provide her with a better mask. Weight loss encouraged, compliance with goal of at least 4-6 hrs every night is the expectation. Advised against medications with sedative side effects Cautioned against driving when sleepy - understanding that sleepiness will vary on a day to day basis

## 2022-01-02 NOTE — Progress Notes (Signed)
   Subjective:    Patient ID: Jessica [name], female    DOB: 1951/05/28, 71 y.o.   MRN: [id number]  HPI  71 yo for follow-up of OSA and dyspnea   PMH -Crohn's disease, hypertension, hyperlipidemia, CKD stage III, moderate MR -schwannoma RT ear   Chief Complaint  Patient presents with   Follow-up    Patient has had a hard time with cpap mask    On previous visits, downloads showed average pressure of 12 cm with residual central events 6/hour.  We changed her to auto settings 8 to 15 cm>> she felt pressure was too high and we changed her back to auto 4 to 13 cm She reports a large leak with nasal mask due to which she was unable to use and has not reverted back to full facemask last 2 nights with good results.  She is planning a camping trip for 2 weeks.  She reports loss of balance due to schwannoma in her ear and falls Breathing is stable, repeat echo was reviewed which shows stable MR   Significant tests/ events reviewed  01/2021 HST showed mod  OSA with AHI 28/ hr 03/2021 TEE mod MR   Review of Systems neg for any significant sore throat, dysphagia, itching, sneezing, nasal congestion or excess/ purulent secretions, fever, chills, sweats, unintended wt loss, pleuritic or exertional cp, hempoptysis, orthopnea pnd or change in chronic leg swelling. Also denies presyncope, palpitations, heartburn, abdominal pain, nausea, vomiting, diarrhea or change in bowel or urinary habits, dysuria,hematuria, rash, arthralgias, visual complaints, headache, numbness weakness or ataxia.     Objective:   Physical Exam  Gen. Pleasant, obese, in no distress ENT - no lesions, no post nasal drip, bite ok, tooth enamel worn off  Neck: No JVD, no thyromegaly, no carotid bruits Lungs: no use of accessory muscles, no dullness to percussion, decreased without rales or rhonchi  Cardiovascular: Rhythm regular, heart sounds  normal, no murmurs or gallops, no peripheral edema Musculoskeletal: No deformities,  no cyanosis or clubbing , no tremors       Assessment & Plan:

## 2022-01-03 DIAGNOSIS — I10 Essential (primary) hypertension: Secondary | ICD-10-CM | POA: Diagnosis not present

## 2022-01-03 DIAGNOSIS — D638 Anemia in other chronic diseases classified elsewhere: Secondary | ICD-10-CM | POA: Diagnosis not present

## 2022-01-03 DIAGNOSIS — E876 Hypokalemia: Secondary | ICD-10-CM | POA: Diagnosis not present

## 2022-01-03 DIAGNOSIS — I129 Hypertensive chronic kidney disease with stage 1 through stage 4 chronic kidney disease, or unspecified chronic kidney disease: Secondary | ICD-10-CM | POA: Diagnosis not present

## 2022-01-03 DIAGNOSIS — N1831 Chronic kidney disease, stage 3a: Secondary | ICD-10-CM | POA: Diagnosis not present

## 2022-01-03 DIAGNOSIS — E211 Secondary hyperparathyroidism, not elsewhere classified: Secondary | ICD-10-CM | POA: Diagnosis not present

## 2022-01-07 DIAGNOSIS — M810 Age-related osteoporosis without current pathological fracture: Secondary | ICD-10-CM

## 2022-01-07 DIAGNOSIS — S59902A Unspecified injury of left elbow, initial encounter: Secondary | ICD-10-CM | POA: Diagnosis not present

## 2022-01-07 DIAGNOSIS — S299XXA Unspecified injury of thorax, initial encounter: Secondary | ICD-10-CM | POA: Diagnosis not present

## 2022-01-07 DIAGNOSIS — S0003XA Contusion of scalp, initial encounter: Secondary | ICD-10-CM | POA: Diagnosis not present

## 2022-01-07 DIAGNOSIS — W19XXXA Unspecified fall, initial encounter: Secondary | ICD-10-CM | POA: Diagnosis not present

## 2022-01-07 DIAGNOSIS — M25522 Pain in left elbow: Secondary | ICD-10-CM | POA: Diagnosis not present

## 2022-01-07 DIAGNOSIS — Z885 Allergy status to narcotic agent status: Secondary | ICD-10-CM | POA: Diagnosis not present

## 2022-01-07 DIAGNOSIS — S01412A Laceration without foreign body of left cheek and temporomandibular area, initial encounter: Secondary | ICD-10-CM | POA: Diagnosis not present

## 2022-01-07 DIAGNOSIS — S0181XA Laceration without foreign body of other part of head, initial encounter: Secondary | ICD-10-CM | POA: Diagnosis not present

## 2022-01-07 DIAGNOSIS — R296 Repeated falls: Secondary | ICD-10-CM | POA: Diagnosis not present

## 2022-01-07 DIAGNOSIS — S5002XA Contusion of left elbow, initial encounter: Secondary | ICD-10-CM | POA: Diagnosis not present

## 2022-01-07 DIAGNOSIS — S60222A Contusion of left hand, initial encounter: Secondary | ICD-10-CM | POA: Diagnosis not present

## 2022-01-07 DIAGNOSIS — R079 Chest pain, unspecified: Secondary | ICD-10-CM | POA: Diagnosis not present

## 2022-01-07 DIAGNOSIS — S6992XA Unspecified injury of left wrist, hand and finger(s), initial encounter: Secondary | ICD-10-CM | POA: Diagnosis not present

## 2022-01-07 HISTORY — DX: Age-related osteoporosis without current pathological fracture: M81.0

## 2022-01-10 ENCOUNTER — Other Ambulatory Visit (HOSPITAL_COMMUNITY): Payer: Self-pay | Admitting: Nephrology

## 2022-01-10 ENCOUNTER — Other Ambulatory Visit: Payer: Self-pay | Admitting: Nephrology

## 2022-01-10 DIAGNOSIS — E876 Hypokalemia: Secondary | ICD-10-CM | POA: Diagnosis not present

## 2022-01-10 DIAGNOSIS — N170 Acute kidney failure with tubular necrosis: Secondary | ICD-10-CM

## 2022-01-10 DIAGNOSIS — H832X1 Labyrinthine dysfunction, right ear: Secondary | ICD-10-CM | POA: Diagnosis not present

## 2022-01-10 DIAGNOSIS — R600 Localized edema: Secondary | ICD-10-CM

## 2022-01-10 DIAGNOSIS — D333 Benign neoplasm of cranial nerves: Secondary | ICD-10-CM | POA: Diagnosis not present

## 2022-01-10 DIAGNOSIS — R2689 Other abnormalities of gait and mobility: Secondary | ICD-10-CM | POA: Diagnosis not present

## 2022-01-10 DIAGNOSIS — N1831 Chronic kidney disease, stage 3a: Secondary | ICD-10-CM

## 2022-01-10 DIAGNOSIS — D638 Anemia in other chronic diseases classified elsewhere: Secondary | ICD-10-CM

## 2022-01-10 DIAGNOSIS — N181 Chronic kidney disease, stage 1: Secondary | ICD-10-CM

## 2022-01-10 DIAGNOSIS — H9041 Sensorineural hearing loss, unilateral, right ear, with unrestricted hearing on the contralateral side: Secondary | ICD-10-CM | POA: Diagnosis not present

## 2022-01-10 DIAGNOSIS — N2581 Secondary hyperparathyroidism of renal origin: Secondary | ICD-10-CM

## 2022-01-10 DIAGNOSIS — E211 Secondary hyperparathyroidism, not elsewhere classified: Secondary | ICD-10-CM | POA: Diagnosis not present

## 2022-01-10 DIAGNOSIS — H903 Sensorineural hearing loss, bilateral: Secondary | ICD-10-CM | POA: Diagnosis not present

## 2022-01-10 DIAGNOSIS — I129 Hypertensive chronic kidney disease with stage 1 through stage 4 chronic kidney disease, or unspecified chronic kidney disease: Secondary | ICD-10-CM | POA: Diagnosis not present

## 2022-01-10 DIAGNOSIS — H838X1 Other specified diseases of right inner ear: Secondary | ICD-10-CM | POA: Diagnosis not present

## 2022-01-15 ENCOUNTER — Encounter: Payer: Self-pay | Admitting: *Deleted

## 2022-01-15 DIAGNOSIS — M810 Age-related osteoporosis without current pathological fracture: Secondary | ICD-10-CM | POA: Diagnosis not present

## 2022-01-16 DIAGNOSIS — Z299 Encounter for prophylactic measures, unspecified: Secondary | ICD-10-CM | POA: Diagnosis not present

## 2022-01-16 DIAGNOSIS — L03114 Cellulitis of left upper limb: Secondary | ICD-10-CM | POA: Diagnosis not present

## 2022-01-16 DIAGNOSIS — I10 Essential (primary) hypertension: Secondary | ICD-10-CM | POA: Diagnosis not present

## 2022-01-16 DIAGNOSIS — Z789 Other specified health status: Secondary | ICD-10-CM | POA: Diagnosis not present

## 2022-01-17 DIAGNOSIS — Z299 Encounter for prophylactic measures, unspecified: Secondary | ICD-10-CM | POA: Diagnosis not present

## 2022-01-17 DIAGNOSIS — I10 Essential (primary) hypertension: Secondary | ICD-10-CM | POA: Diagnosis not present

## 2022-01-17 DIAGNOSIS — L03119 Cellulitis of unspecified part of limb: Secondary | ICD-10-CM | POA: Diagnosis not present

## 2022-01-17 DIAGNOSIS — K5010 Crohn's disease of large intestine without complications: Secondary | ICD-10-CM | POA: Diagnosis not present

## 2022-01-18 ENCOUNTER — Telehealth: Payer: Self-pay | Admitting: Cardiology

## 2022-01-18 DIAGNOSIS — L03119 Cellulitis of unspecified part of limb: Secondary | ICD-10-CM | POA: Diagnosis not present

## 2022-01-18 DIAGNOSIS — I10 Essential (primary) hypertension: Secondary | ICD-10-CM | POA: Diagnosis not present

## 2022-01-18 DIAGNOSIS — K5010 Crohn's disease of large intestine without complications: Secondary | ICD-10-CM | POA: Diagnosis not present

## 2022-01-18 DIAGNOSIS — Z299 Encounter for prophylactic measures, unspecified: Secondary | ICD-10-CM | POA: Diagnosis not present

## 2022-01-18 NOTE — Telephone Encounter (Signed)
Pt c/o medication issue:  1. Name of Medication: metoprolol succinate (TOPROL-XL) 25 MG 24 hr tablet  2. How are you currently taking this medication (dosage and times per day)?   3. Are you having a reaction (difficulty breathing--STAT)?   4. What is your medication issue?   Patient is requesting to speak with Dr. Nelly Laurence nurse to clarify medications again. She states she was advised to modify her medication if her heart is out of rhythm, but her kidney doctor put her on  Hydralazine 50 MG and she would like to discuss.

## 2022-01-21 DIAGNOSIS — M25561 Pain in right knee: Secondary | ICD-10-CM | POA: Diagnosis not present

## 2022-01-21 DIAGNOSIS — Z96651 Presence of right artificial knee joint: Secondary | ICD-10-CM | POA: Diagnosis not present

## 2022-01-21 DIAGNOSIS — R2681 Unsteadiness on feet: Secondary | ICD-10-CM | POA: Diagnosis not present

## 2022-01-21 MED ORDER — AMLODIPINE BESYLATE 10 MG PO TABS: 10.0000 mg | ORAL_TABLET | Freq: Every day | ORAL | 0 refills | Status: DC

## 2022-01-21 NOTE — Telephone Encounter (Signed)
Patient made aware and verbalized understanding. Norvasc 10 mg sent to the pharmacy.

## 2022-01-21 NOTE — Telephone Encounter (Signed)
States that she has been having concerns about her BP not going down which she states has been an ongoing issue for the past year. States that her kidney doctor took her off of hctz and started her on hydralazine twice a day. Takes her BP in the same arm, around the same time (which is in the late afternoons) and waits anywhere from 10 min to an hour after sitting down before she takes it. Reading was in the 190's last night and 145/84 while she was on the phone with me. Wants to know what does Dr. Harl Bowie recommend to help with her BP. Denies chest pains and dizziness. Please advise

## 2022-01-21 NOTE — Telephone Encounter (Signed)
Hydralazine is fine, if still having high bp's would increase her norvasc. Clarify she is taking 68m daily of norvasc, if so can increase to 174mdaily.Update usKorean bp's 1 weeks   J Zandra AbtsD

## 2022-01-22 ENCOUNTER — Ambulatory Visit (HOSPITAL_COMMUNITY)
Admission: RE | Admit: 2022-01-22 | Discharge: 2022-01-22 | Disposition: A | Discharge status: Home / Self Care | Payer: Medicare Other | Source: Ambulatory Visit | Attending: Nephrology | Admitting: Nephrology

## 2022-01-22 DIAGNOSIS — N181 Chronic kidney disease, stage 1: Secondary | ICD-10-CM | POA: Insufficient documentation

## 2022-01-22 DIAGNOSIS — N1831 Chronic kidney disease, stage 3a: Secondary | ICD-10-CM | POA: Diagnosis not present

## 2022-01-22 DIAGNOSIS — N170 Acute kidney failure with tubular necrosis: Secondary | ICD-10-CM | POA: Insufficient documentation

## 2022-01-22 DIAGNOSIS — E876 Hypokalemia: Secondary | ICD-10-CM | POA: Diagnosis not present

## 2022-01-22 DIAGNOSIS — N2581 Secondary hyperparathyroidism of renal origin: Secondary | ICD-10-CM | POA: Diagnosis not present

## 2022-01-22 DIAGNOSIS — D638 Anemia in other chronic diseases classified elsewhere: Secondary | ICD-10-CM | POA: Insufficient documentation

## 2022-01-22 DIAGNOSIS — E211 Secondary hyperparathyroidism, not elsewhere classified: Secondary | ICD-10-CM | POA: Diagnosis not present

## 2022-01-22 DIAGNOSIS — I129 Hypertensive chronic kidney disease with stage 1 through stage 4 chronic kidney disease, or unspecified chronic kidney disease: Secondary | ICD-10-CM | POA: Diagnosis not present

## 2022-01-22 DIAGNOSIS — R600 Localized edema: Secondary | ICD-10-CM | POA: Diagnosis not present

## 2022-01-22 DIAGNOSIS — N189 Chronic kidney disease, unspecified: Secondary | ICD-10-CM | POA: Diagnosis not present

## 2022-01-23 DIAGNOSIS — Z96651 Presence of right artificial knee joint: Secondary | ICD-10-CM | POA: Diagnosis not present

## 2022-01-23 DIAGNOSIS — M25561 Pain in right knee: Secondary | ICD-10-CM | POA: Diagnosis not present

## 2022-01-23 DIAGNOSIS — R2681 Unsteadiness on feet: Secondary | ICD-10-CM | POA: Diagnosis not present

## 2022-01-28 DIAGNOSIS — M25561 Pain in right knee: Secondary | ICD-10-CM | POA: Diagnosis not present

## 2022-01-28 DIAGNOSIS — R2681 Unsteadiness on feet: Secondary | ICD-10-CM | POA: Diagnosis not present

## 2022-01-28 DIAGNOSIS — Z96651 Presence of right artificial knee joint: Secondary | ICD-10-CM | POA: Diagnosis not present

## 2022-01-30 DIAGNOSIS — R2681 Unsteadiness on feet: Secondary | ICD-10-CM | POA: Diagnosis not present

## 2022-01-30 DIAGNOSIS — Z96651 Presence of right artificial knee joint: Secondary | ICD-10-CM | POA: Diagnosis not present

## 2022-01-30 DIAGNOSIS — M25561 Pain in right knee: Secondary | ICD-10-CM | POA: Diagnosis not present

## 2022-01-31 DIAGNOSIS — H26493 Other secondary cataract, bilateral: Secondary | ICD-10-CM | POA: Diagnosis not present

## 2022-02-02 DIAGNOSIS — D638 Anemia in other chronic diseases classified elsewhere: Secondary | ICD-10-CM | POA: Diagnosis not present

## 2022-02-02 DIAGNOSIS — I129 Hypertensive chronic kidney disease with stage 1 through stage 4 chronic kidney disease, or unspecified chronic kidney disease: Secondary | ICD-10-CM | POA: Diagnosis not present

## 2022-02-02 DIAGNOSIS — E876 Hypokalemia: Secondary | ICD-10-CM | POA: Diagnosis not present

## 2022-02-02 DIAGNOSIS — N170 Acute kidney failure with tubular necrosis: Secondary | ICD-10-CM | POA: Diagnosis not present

## 2022-02-02 DIAGNOSIS — N1831 Chronic kidney disease, stage 3a: Secondary | ICD-10-CM | POA: Diagnosis not present

## 2022-02-04 DIAGNOSIS — I10 Essential (primary) hypertension: Secondary | ICD-10-CM | POA: Diagnosis not present

## 2022-02-04 DIAGNOSIS — R2681 Unsteadiness on feet: Secondary | ICD-10-CM | POA: Diagnosis not present

## 2022-02-04 DIAGNOSIS — M25561 Pain in right knee: Secondary | ICD-10-CM | POA: Diagnosis not present

## 2022-02-04 DIAGNOSIS — Z96651 Presence of right artificial knee joint: Secondary | ICD-10-CM | POA: Diagnosis not present

## 2022-02-06 DIAGNOSIS — R2681 Unsteadiness on feet: Secondary | ICD-10-CM | POA: Diagnosis not present

## 2022-02-06 DIAGNOSIS — G894 Chronic pain syndrome: Secondary | ICD-10-CM | POA: Diagnosis not present

## 2022-02-06 DIAGNOSIS — N390 Urinary tract infection, site not specified: Secondary | ICD-10-CM | POA: Diagnosis not present

## 2022-02-06 DIAGNOSIS — Z96651 Presence of right artificial knee joint: Secondary | ICD-10-CM | POA: Diagnosis not present

## 2022-02-06 DIAGNOSIS — M25561 Pain in right knee: Secondary | ICD-10-CM | POA: Diagnosis not present

## 2022-02-06 DIAGNOSIS — M545 Low back pain, unspecified: Secondary | ICD-10-CM | POA: Diagnosis not present

## 2022-02-21 ENCOUNTER — Telehealth: Payer: Self-pay

## 2022-02-21 NOTE — Telephone Encounter (Signed)
   Pre-operative Risk Assessment    Patient Name: Jessica [name]  DOB: 1950/11/09 MRN: [id number]      Request for Surgical Clearance    Procedure:   Right knee tibial vs total knee revision; choice  Date of Surgery:  Clearance 06/05/22                             {    Surgeon:  Dr. Gaynelle Arabian Surgeon's Group or Practice Name:  [facility] Phone number:  [phone] Fax number:  [phone]   Type of Clearance Requested:   - Medical    Type of Anesthesia:  Not Indicated   Additional requests/questions:   Requesting pre-operative clearance to assess the surgical risks.  Please let them know if there are any contraindications or recommendations for the patient prior to surgery, during the procedure, or in the post-operative period.  Signed, Sung Amabile   02/21/2022, 8:29 AM

## 2022-02-21 NOTE — Telephone Encounter (Signed)
   Name: Jessica [name]  DOB: 1951-04-22  MRN: [id number]  Primary Cardiologist: [name], MD  Chart reviewed as part of pre-operative protocol coverage. The patient has an upcoming visit scheduled with Dr. Harl Bowie on 03/07/2022 at which time clearance can be addressed in case there are any issues that would impact surgical recommendations.  R knee tibial vs total knee revision is not scheduled until 06/05/2022 as below. I added preop FYI to appointment note so that provider is aware to address at time of outpatient visit.  Per office protocol the cardiology provider should forward their finalized clearance decision and recommendations regarding antiplatelet therapy to the requesting party below.    I will route this message as FYI to requesting party and remove this message from the preop box as separate preop APP input not needed at this time.   Please call with any questions.  [name], NP  02/21/2022, 11:33 AM

## 2022-03-06 DIAGNOSIS — I10 Essential (primary) hypertension: Secondary | ICD-10-CM | POA: Diagnosis not present

## 2022-03-07 ENCOUNTER — Ambulatory Visit: Payer: Medicare Other | Attending: Cardiology | Admitting: Cardiology

## 2022-03-07 ENCOUNTER — Telehealth: Payer: Self-pay | Admitting: Cardiology

## 2022-03-07 ENCOUNTER — Encounter: Payer: Self-pay | Admitting: *Deleted

## 2022-03-07 ENCOUNTER — Encounter: Payer: Self-pay | Admitting: Cardiology

## 2022-03-07 VITALS — BP 120/70 | HR 74 | Ht 62.0 in | Wt 167.8 lb

## 2022-03-07 DIAGNOSIS — E785 Hyperlipidemia, unspecified: Secondary | ICD-10-CM | POA: Diagnosis not present

## 2022-03-07 DIAGNOSIS — Z01810 Encounter for preprocedural cardiovascular examination: Secondary | ICD-10-CM | POA: Diagnosis not present

## 2022-03-07 DIAGNOSIS — I340 Nonrheumatic mitral (valve) insufficiency: Secondary | ICD-10-CM | POA: Diagnosis not present

## 2022-03-07 DIAGNOSIS — R0602 Shortness of breath: Secondary | ICD-10-CM

## 2022-03-07 DIAGNOSIS — R002 Palpitations: Secondary | ICD-10-CM | POA: Diagnosis not present

## 2022-03-07 DIAGNOSIS — I10 Essential (primary) hypertension: Secondary | ICD-10-CM | POA: Diagnosis not present

## 2022-03-07 DIAGNOSIS — H3589 Other specified retinal disorders: Secondary | ICD-10-CM | POA: Diagnosis not present

## 2022-03-07 MED ORDER — METOPROLOL SUCCINATE ER 25 MG PO TB24: 37.5000 mg | ORAL_TABLET | Freq: Every day | ORAL | 6 refills | Status: DC

## 2022-03-07 NOTE — Progress Notes (Signed)
Clinical Summary Jessica [name] is a 71 y.o.female seen today for follow up of the following meidcal problems.   1.Palpitations - can occur at rest or with activity - feeling of heart pounding. No other associated symptoms. Symptoms on and off throughout the day. - no coffee, rare sodas, occasional tea, no energy drinks, no EtOH - recent family stress  11/2021 3 day monitor: rare PACs, one runs SVT x 8 beats. Occasoinal PVCs (1.3%).  - some ongoing symptoms     2. Mitral regurgitation - severe MR from pcp note   02/2021 echo at [facility]: LVEF >55%, severe MR. LVIDs 2.6  03/2021 TEE: moderate MR 12/2020 TSH 8.96 Hgb 13 Plt 169 TSH 8.96   B.  12/2021 echo: LVEF 55-60%, grade I dd, normal RV, mild to mod MR,    3. Jessica [name] with walking just in from parking lot - no chest pains - some recent LE edema x 3-4 months progressing - had been chlothalidone nephrology stopped. Taking aldactone 51m daily.    4.HTN - neprhology stopped thiaizde diuretic, started hydralazine -compliant with meds.       5.OSA - followed by Jessica AElsworth [place]- mixed compliance with cpap     5. CKD - followed by Jessica BTheador Hawthorne  6.Hypothyroidism - last TSH was elevated from 12/2020, perhaps playing a role in her fatigue     7. Chronic fatigue    8. Preop evaluation - considering knee replacement   \ Past Medical History:  Diagnosis Date   Anxiety attack    Arthritis    Back pain, chronic    Chronic kidney disease    [place]'s [place]    Depression (emotion)    Dysrhythmia    GERD (gastroesophageal reflux disease)    Hypertension    Hypothyroid    Neuropathy    Sleep apnea    cpap is not working per patient - needs to find a new doctor - not used since 7/16 per pat    Stage 3 chronic kidney disease [place]    Vertigo    [name]     Allergies  Allergen Reactions   Morphine And Related Hives and Itching    May cause blood pressure to drop   Robaxin  [Methocarbamol] Itching   Sulfa Antibiotics Itching    ALL SULFA DRUGS     Current Outpatient Medications  Medication Sig Dispense Refill   alendronate (FOSAMAX) 70 MG tablet Take 70 mg by mouth every Monday. As needed     amLODipine (NORVASC) 10 MG tablet Take 1 tablet (10 mg total) by mouth daily. 90 tablet 0   busPIRone (BUSPAR) 5 MG tablet Take 1 tablet (5 mg total) by mouth 3 (three) times daily. 90 tablet 2   Carboxymethylcellulose Sodium (RETAINE [facility] OP) Place 1 drop into both eyes every 6 (six) hours as needed (dry eyes).     dicyclomine (BENTYL) 10 MG capsule Take 1 capsule (10 mg total) by mouth 2 (two) times daily before a meal. 60 capsule 5   fluticasone (FLONASE) 50 MCG/ACT nasal spray Place 1 spray into the nose daily as needed for allergies or rhinitis.     hydrochlorothiazide (HYDRODIURIL) 12.5 MG tablet Take 12.5 mg by mouth daily.     HYDROcodone-acetaminophen (NORCO/VICODIN) 5-325 MG tablet Take 1 tablet by mouth at bedtime.     levothyroxine (SYNTHROID) 75 MCG tablet Take 75 mcg by mouth daily before  breakfast.     loperamide (IMODIUM) 2 MG capsule TAKE 1 CAPSULE BY MOUTH TWICE DAILY AS NEEDED FOR LOOSE STOOLS 180 capsule 1   metoprolol succinate (TOPROL-XL) 25 MG 24 hr tablet Take 25 mg by mouth daily.     Multiple Vitamin (MULTIVITAMIN WITH MINERALS) TABS tablet Take 1 tablet by mouth daily.     nystatin-triamcinolone (MYCOLOG II) cream Apply 1 application topically 2 (two) times daily. 30 g 1   pantoprazole (PROTONIX) 40 MG tablet Take 1 tablet (40 mg total) by mouth daily before breakfast. (Patient taking differently: Take 40 mg by mouth daily as needed (acid reflux).) 90 tablet 3   potassium chloride (KLOR-CON) 10 MEQ tablet Take 20 mEq by mouth daily.     rOPINIRole (REQUIP) 0.5 MG tablet Take 0.5 mg by mouth in the morning and at bedtime.     rosuvastatin (CRESTOR) 5 MG tablet Take 5 mg by mouth daily.     sertraline (ZOLOFT) 100 MG tablet Take 1.5 tablets (150 mg  total) by mouth daily. 135 tablet 3   traZODone (DESYREL) 50 MG tablet Take 1 tablet (50 mg total) by mouth at bedtime. 30 tablet 2   No current facility-administered medications for this visit.     Past Surgical History:  Procedure Laterality Date   ABDOMINAL HYSTERECTOMY     BIOPSY  06/08/2018   Procedure: BIOPSY;  Surgeon: Rogene Houston, MD;  [facility]: [facility];  Service: Endoscopy;;  (colon)   CHOLECYSTECTOMY     COLON SURGERY     for Crohn's in the 80s in [place]   COLONOSCOPY N/A 03/11/2013   Procedure: COLONOSCOPY;  Surgeon: Rogene Houston, MD;  Location: [facility];  Service: Endoscopy;  Laterality: N/A;  1200   COLONOSCOPY N/A 06/08/2018   Procedure: COLONOSCOPY;  Surgeon: Rogene Houston, MD;  [facility]: [facility];  Service: Endoscopy;  Laterality: N/A;  8:25   fatty tumor removed     HARDWARE REMOVAL Right 08/07/2016   Procedure: HARDWARE REMOVAL;  Surgeon: Gaynelle Arabian, MD;  Location: [facility];  Service: Orthopedics;  Laterality: Right;   Hip replacement      Rt hip in 2010 in [place] ARTHROSCOPY Left 03/29/2015   Procedure: ARTHROSCOPY LEFT KNEE WITH MENSICAL DEBRIDEMENT, chondroplasty;  Surgeon: Gaynelle Arabian, MD;  Location: [facility];  Service: Orthopedics;  Laterality: Left;   ORIF TIBIA PLATEAU Right 02/29/2016   Procedure: OPEN REDUCTION INTERNAL FIXATION (ORIF RIGHT  TIBIAL PLATEAU FRACTURE;  Surgeon: Gaynelle Arabian, MD;  Location: [facility];  Service: Orthopedics;  Laterality: Right;   TEE WITHOUT CARDIOVERSION N/A 03/27/2021   Procedure: TRANSESOPHAGEAL ECHOCARDIOGRAM (TEE);  Surgeon: Arnoldo Lenis, MD;  [facility];  Service: Endoscopy;  Laterality: N/A;   TOTAL KNEE ARTHROPLASTY Right 08/25/2017   Procedure: TOTAL RIGHT KNEE ARTHROPLASTY;  Surgeon: Gaynelle Arabian, MD;  Location: [facility];  Service: Orthopedics;  Laterality: Right;     Allergies  Allergen Reactions   Morphine And Related Hives and Itching    May cause blood pressure  to drop   Robaxin [Methocarbamol] Itching   Sulfa Antibiotics Itching    ALL SULFA DRUGS      Family History  Problem Relation Age of Onset   Crohn's disease Brother    Depression Mother    Anxiety disorder Mother    Depression Sister    Aneurysm Father    Colon cancer Neg Hx      Social History Ms. Streiff reports  that she has never smoked. She has never used smokeless tobacco. Ms. Macaluso reports no history of alcohol use.   Review of Systems CONSTITUTIONAL: No weight loss, fever, chills, weakness or fatigue.  HEENT: Eyes: No visual loss, blurred vision, double vision or yellow sclerae.No hearing loss, sneezing, congestion, runny nose or sore throat.  SKIN: No rash or itching.  CARDIOVASCULAR: per hpi RESPIRATORY: No shortness of breath, cough or sputum.  GASTROINTESTINAL: No anorexia, nausea, vomiting or diarrhea. No abdominal pain or blood.  GENITOURINARY: No burning on urination, no polyuria NEUROLOGICAL: No headache, dizziness, syncope, paralysis, ataxia, numbness or tingling in the extremities. No change in bowel or bladder control.  MUSCULOSKELETAL: No muscle, back pain, joint pain or stiffness.  LYMPHATICS: No enlarged nodes. No history of splenectomy.  PSYCHIATRIC: No history of depression or anxiety.  ENDOCRINOLOGIC: No reports of sweating, cold or heat intolerance. No polyuria or polydipsia.  Marland Kitchen   Physical Examination Today's Vitals   03/07/22 1342  BP: 120/70  Pulse: 74  SpO2: 98%  Weight: 167 lb 12.8 oz (76.1 kg)  Height: 5' 2"  (1.575 m)   Body mass index is 30.69 kg/m.  Gen: resting comfortably, no acute distress HEENT: no scleral icterus, pupils equal round and reactive, no palptable cervical adenopathy,  CV: RRR, 2/6 systolic murmur apex, no jvd Resp: Clear to auscultation bilaterally GI: abdomen is soft, non-tender, non-distended, normal bowel sounds, no hepatosplenomegaly MSK: extremities are warm, no edema.  Skin: warm, no rash Neuro:  no  focal deficits Psych: appropriate affect   Diagnostic Studies  03/2021 echo IMPRESSIONS     1. Left ventricular ejection fraction, by estimation, is 60 to 65%. The  left ventricle has normal function. The left ventricle has no regional  wall motion abnormalities.   2. Right ventricular systolic function is normal. The right ventricular  size is normal.   3. No left atrial/left atrial appendage thrombus was detected. The LAA  emptying velocity was 54 cm/s.   4. The MR vena contracta is 0.4 cm. . The mitral valve is abnormal.  Moderate mitral valve regurgitation. No evidence of mitral stenosis.   5. The aortic valve is tricuspid. Aortic valve regurgitation is not  visualized. No aortic stenosis is present.     11/2021 monitor 3 day monitor Rare supraventricular ectopy in the form of isolated PACs, couplets, triplets. One run of SVT 8 beats. Occasional ventricular ectopy in the form of isolated PVCs, couplets No reported symptoms   Assessment and Plan   1.Palpitations -heart monitor with primarily benign ectopy. Ongoing symptoms, increase toprol to 37.13m daily   2. Mitral regurgitation - 12/2021 echo mild to moderate MR, not clinically significant. Continue to monitor  3. LE edema - AKI when on diuretic before, neprhology stopped - suspect related to venous insufficiency due to age, weight, prior leg surgeries - defer any consideration to repeat diuretic to neprholgoy  4. DOE - unclear etiology - will plan for lexiscan to further evaluate, particularly given her upcoming knee surgery   5. Preoperative evaluation - considering knee replacement.  - does not tolerate >4METs, will plan for lexiscan for further risk stratification     JArnoldo Lenis M.D

## 2022-03-07 NOTE — Telephone Encounter (Signed)
Checking percert on the following patient for testing scheduled at [facility].    LEXISCAN   03/13/2022

## 2022-03-07 NOTE — Patient Instructions (Addendum)
Medication Instructions:  Increase Toprol to 37.32m daily  Continue all other medications.     Labwork: none  Testing/Procedures: Your physician has requested that you have a lexiscan myoview. For further information please visit [email or web address] Please follow instruction sheet, as given. Office will contact with results via phone, letter or mychart.     Follow-Up: 6 months   Any Other Special Instructions Will Be Listed Below (If Applicable). Compression stocking order given today.   If you need a refill on your cardiac medications before your next appointment, please call your pharmacy.

## 2022-03-13 ENCOUNTER — Ambulatory Visit (HOSPITAL_COMMUNITY)
Admission: RE | Admit: 2022-03-13 | Discharge: 2022-03-13 | Disposition: A | Discharge status: Home / Self Care | Payer: Medicare Other | Source: Ambulatory Visit | Attending: Cardiology | Admitting: Cardiology

## 2022-03-13 ENCOUNTER — Encounter (HOSPITAL_COMMUNITY)
Admission: RE | Admit: 2022-03-13 | Discharge: 2022-03-13 | Disposition: A | Discharge status: Home / Self Care | Payer: Medicare Other | Source: Ambulatory Visit | Attending: Cardiology | Admitting: Cardiology

## 2022-03-13 DIAGNOSIS — R0602 Shortness of breath: Secondary | ICD-10-CM | POA: Diagnosis not present

## 2022-03-13 LAB — NM MYOCAR MULTI W/SPECT W/WALL MOTION / EF
LV dias vol: 95 mL (ref 46–106)
LV sys vol: 37 mL
Nuc Stress EF: 61 %
Peak HR: 93 {beats}/min
RATE: 0.4
Rest HR: 72 {beats}/min
SDS: 1
SRS: 0
SSS: 1
ST Depression (mm): 0 mm
TID: 1.07

## 2022-03-13 MED ORDER — TECHNETIUM TC 99M TETROFOSMIN IV KIT
30.0000 | PACK | Freq: Once | INTRAVENOUS | Status: AC | PRN
  Administered 2022-03-13: 29.2 via INTRAVENOUS

## 2022-03-13 MED ORDER — TECHNETIUM TC 99M TETROFOSMIN IV KIT
10.0000 | PACK | Freq: Once | INTRAVENOUS | Status: AC | PRN
  Administered 2022-03-13: 9.6 via INTRAVENOUS

## 2022-03-13 MED ORDER — SODIUM CHLORIDE FLUSH 0.9 % IV SOLN
INTRAVENOUS | Status: AC
  Administered 2022-03-13: 10 mL via INTRAVENOUS
  Filled 2022-03-13: qty 10

## 2022-03-13 MED ORDER — REGADENOSON 0.4 MG/5ML IV SOLN
INTRAVENOUS | Status: AC
  Administered 2022-03-13: 0.4 mg via INTRAVENOUS
  Filled 2022-03-13: qty 5

## 2022-03-14 DIAGNOSIS — G5603 Carpal tunnel syndrome, bilateral upper limbs: Secondary | ICD-10-CM | POA: Diagnosis not present

## 2022-03-14 DIAGNOSIS — G2581 Restless legs syndrome: Secondary | ICD-10-CM | POA: Diagnosis not present

## 2022-03-14 DIAGNOSIS — M542 Cervicalgia: Secondary | ICD-10-CM | POA: Diagnosis not present

## 2022-03-14 DIAGNOSIS — M545 Low back pain, unspecified: Secondary | ICD-10-CM | POA: Diagnosis not present

## 2022-03-14 DIAGNOSIS — Z79899 Other long term (current) drug therapy: Secondary | ICD-10-CM | POA: Diagnosis not present

## 2022-03-21 ENCOUNTER — Encounter: Payer: Self-pay | Admitting: *Deleted

## 2022-03-25 DIAGNOSIS — I10 Essential (primary) hypertension: Secondary | ICD-10-CM | POA: Diagnosis not present

## 2022-03-25 DIAGNOSIS — D509 Iron deficiency anemia, unspecified: Secondary | ICD-10-CM | POA: Diagnosis not present

## 2022-03-25 DIAGNOSIS — Z6832 Body mass index (BMI) 32.0-32.9, adult: Secondary | ICD-10-CM | POA: Diagnosis not present

## 2022-03-25 DIAGNOSIS — Z299 Encounter for prophylactic measures, unspecified: Secondary | ICD-10-CM | POA: Diagnosis not present

## 2022-03-25 DIAGNOSIS — D692 Other nonthrombocytopenic purpura: Secondary | ICD-10-CM | POA: Diagnosis not present

## 2022-03-25 DIAGNOSIS — Z01818 Encounter for other preprocedural examination: Secondary | ICD-10-CM | POA: Diagnosis not present

## 2022-03-27 ENCOUNTER — Other Ambulatory Visit (HOSPITAL_COMMUNITY)
Admission: RE | Admit: 2022-03-27 | Discharge: 2022-03-27 | Disposition: A | Discharge status: Home / Self Care | Payer: Medicare Other | Source: Ambulatory Visit | Attending: Nephrology | Admitting: Nephrology

## 2022-03-27 DIAGNOSIS — N1831 Chronic kidney disease, stage 3a: Secondary | ICD-10-CM | POA: Insufficient documentation

## 2022-03-27 DIAGNOSIS — E211 Secondary hyperparathyroidism, not elsewhere classified: Secondary | ICD-10-CM | POA: Diagnosis not present

## 2022-03-27 DIAGNOSIS — E876 Hypokalemia: Secondary | ICD-10-CM | POA: Diagnosis not present

## 2022-03-27 DIAGNOSIS — N170 Acute kidney failure with tubular necrosis: Secondary | ICD-10-CM | POA: Insufficient documentation

## 2022-03-27 DIAGNOSIS — E870 Hyperosmolality and hypernatremia: Secondary | ICD-10-CM | POA: Diagnosis not present

## 2022-03-27 DIAGNOSIS — D638 Anemia in other chronic diseases classified elsewhere: Secondary | ICD-10-CM | POA: Diagnosis not present

## 2022-03-27 DIAGNOSIS — I129 Hypertensive chronic kidney disease with stage 1 through stage 4 chronic kidney disease, or unspecified chronic kidney disease: Secondary | ICD-10-CM | POA: Diagnosis not present

## 2022-03-27 LAB — RENAL FUNCTION PANEL
Albumin: 3.9 g/dL (ref 3.5–5.0)
Anion gap: 4 — ABNORMAL LOW (ref 5–15)
BUN: 20 mg/dL (ref 8–23)
CO2: 23 mmol/L (ref 22–32)
Calcium: 8.7 mg/dL — ABNORMAL LOW (ref 8.9–10.3)
Chloride: 112 mmol/L — ABNORMAL HIGH (ref 98–111)
Creatinine, Ser: 1.16 mg/dL — ABNORMAL HIGH (ref 0.44–1.00)
GFR, Estimated: 51 mL/min — ABNORMAL LOW (ref >60)
Glucose, Bld: 94 mg/dL (ref 70–99)
Phosphorus: 3.1 mg/dL (ref 2.5–4.6)
Potassium: 4.3 mmol/L (ref 3.5–5.1)
Sodium: 139 mmol/L (ref 135–145)

## 2022-04-02 ENCOUNTER — Ambulatory Visit: Payer: Medicare Other | Admitting: Podiatry

## 2022-04-02 DIAGNOSIS — M7750 Other enthesopathy of unspecified foot: Secondary | ICD-10-CM

## 2022-04-03 DIAGNOSIS — Z96651 Presence of right artificial knee joint: Secondary | ICD-10-CM | POA: Diagnosis not present

## 2022-04-05 DIAGNOSIS — I10 Essential (primary) hypertension: Secondary | ICD-10-CM | POA: Diagnosis not present

## 2022-04-11 DIAGNOSIS — Z23 Encounter for immunization: Secondary | ICD-10-CM | POA: Diagnosis not present

## 2022-04-12 ENCOUNTER — Other Ambulatory Visit: Payer: Self-pay | Admitting: Cardiology

## 2022-04-15 DIAGNOSIS — Z23 Encounter for immunization: Secondary | ICD-10-CM | POA: Diagnosis not present

## 2022-04-18 DIAGNOSIS — Z96651 Presence of right artificial knee joint: Secondary | ICD-10-CM | POA: Diagnosis not present

## 2022-04-22 ENCOUNTER — Ambulatory Visit (INDEPENDENT_AMBULATORY_CARE_PROVIDER_SITE_OTHER): Payer: Medicare Other | Admitting: Podiatry

## 2022-04-22 ENCOUNTER — Encounter: Payer: Self-pay | Admitting: Podiatry

## 2022-04-22 ENCOUNTER — Ambulatory Visit: Payer: Medicare Other

## 2022-04-22 DIAGNOSIS — M7751 Other enthesopathy of right foot: Secondary | ICD-10-CM | POA: Diagnosis not present

## 2022-04-22 DIAGNOSIS — M778 Other enthesopathies, not elsewhere classified: Secondary | ICD-10-CM | POA: Diagnosis not present

## 2022-04-22 DIAGNOSIS — M7752 Other enthesopathy of left foot: Secondary | ICD-10-CM | POA: Diagnosis not present

## 2022-04-22 MED ORDER — TRIAMCINOLONE ACETONIDE 40 MG/ML IJ SUSP
80.0000 mg | Freq: Once | INTRAMUSCULAR | Status: AC
  Administered 2022-04-22: 80 mg

## 2022-04-22 NOTE — Progress Notes (Signed)
She presents today for follow-up of her bilateral ankles states that they usually get some shots and then because they are painful.  She is referring to her subtalar joint capsulitis.  She states that she has been doing pretty well since she has not been here since over a year now.  Objective: Vital signs stable she alert oriented x3 she has pain on palpation of the sinus tarsi bilateral she has pain on end range of motion of the subtalar joint bilateral.  Pain on palpation to the second metatarsophalangeal joint bilateral.  No erythema edema associated with it.  Pain on end range of motion is present.  Assessment: Subtalar joint capsulitis bilateral. Capsulitis second metatarsophalangeal joint bilateral Plan: I injected 20 mg Kenalog 5 mg Marcaine point of maximal tenderness bilateral heels.  She also has tenderness which I injected around the second metatarsal phalangeal joint bilaterally.

## 2022-04-28 ENCOUNTER — Other Ambulatory Visit (INDEPENDENT_AMBULATORY_CARE_PROVIDER_SITE_OTHER): Payer: Self-pay | Admitting: Internal Medicine

## 2022-04-30 DIAGNOSIS — R2681 Unsteadiness on feet: Secondary | ICD-10-CM | POA: Diagnosis not present

## 2022-04-30 DIAGNOSIS — H903 Sensorineural hearing loss, bilateral: Secondary | ICD-10-CM | POA: Diagnosis not present

## 2022-04-30 DIAGNOSIS — D333 Benign neoplasm of cranial nerves: Secondary | ICD-10-CM | POA: Diagnosis not present

## 2022-04-30 DIAGNOSIS — Z974 Presence of external hearing-aid: Secondary | ICD-10-CM | POA: Diagnosis not present

## 2022-04-30 DIAGNOSIS — H838X1 Other specified diseases of right inner ear: Secondary | ICD-10-CM | POA: Diagnosis not present

## 2022-04-30 DIAGNOSIS — D320 Benign neoplasm of cerebral meninges: Secondary | ICD-10-CM | POA: Diagnosis not present

## 2022-04-30 DIAGNOSIS — I6389 Other cerebral infarction: Secondary | ICD-10-CM | POA: Diagnosis not present

## 2022-04-30 DIAGNOSIS — H832X1 Labyrinthine dysfunction, right ear: Secondary | ICD-10-CM | POA: Diagnosis not present

## 2022-05-02 DIAGNOSIS — N1831 Chronic kidney disease, stage 3a: Secondary | ICD-10-CM | POA: Diagnosis not present

## 2022-05-02 DIAGNOSIS — E876 Hypokalemia: Secondary | ICD-10-CM | POA: Diagnosis not present

## 2022-05-06 DIAGNOSIS — I10 Essential (primary) hypertension: Secondary | ICD-10-CM | POA: Diagnosis not present

## 2022-05-08 DIAGNOSIS — R251 Tremor, unspecified: Secondary | ICD-10-CM | POA: Diagnosis not present

## 2022-05-08 DIAGNOSIS — I10 Essential (primary) hypertension: Secondary | ICD-10-CM | POA: Diagnosis not present

## 2022-05-08 DIAGNOSIS — D333 Benign neoplasm of cranial nerves: Secondary | ICD-10-CM | POA: Diagnosis not present

## 2022-05-08 DIAGNOSIS — Z299 Encounter for prophylactic measures, unspecified: Secondary | ICD-10-CM | POA: Diagnosis not present

## 2022-05-08 DIAGNOSIS — I6381 Other cerebral infarction due to occlusion or stenosis of small artery: Secondary | ICD-10-CM | POA: Diagnosis not present

## 2022-05-08 DIAGNOSIS — Z789 Other specified health status: Secondary | ICD-10-CM | POA: Diagnosis not present

## 2022-05-09 DIAGNOSIS — M5459 Other low back pain: Secondary | ICD-10-CM | POA: Diagnosis not present

## 2022-05-09 DIAGNOSIS — M542 Cervicalgia: Secondary | ICD-10-CM | POA: Diagnosis not present

## 2022-05-09 DIAGNOSIS — I639 Cerebral infarction, unspecified: Secondary | ICD-10-CM | POA: Diagnosis not present

## 2022-05-09 DIAGNOSIS — Z79899 Other long term (current) drug therapy: Secondary | ICD-10-CM | POA: Diagnosis not present

## 2022-05-09 DIAGNOSIS — G2581 Restless legs syndrome: Secondary | ICD-10-CM | POA: Diagnosis not present

## 2022-05-20 DIAGNOSIS — G459 Transient cerebral ischemic attack, unspecified: Secondary | ICD-10-CM | POA: Diagnosis not present

## 2022-05-23 ENCOUNTER — Other Ambulatory Visit (HOSPITAL_COMMUNITY): Payer: Medicare Other

## 2022-06-05 ENCOUNTER — Inpatient Hospital Stay: Admit: 2022-06-05 | Payer: Medicare Other | Admitting: Orthopedic Surgery

## 2022-06-05 DIAGNOSIS — I10 Essential (primary) hypertension: Secondary | ICD-10-CM | POA: Diagnosis not present

## 2022-06-05 SURGERY — TOTAL KNEE REVISION
Anesthesia: Choice | Site: Knee | Laterality: Right

## 2022-07-02 ENCOUNTER — Ambulatory Visit (INDEPENDENT_AMBULATORY_CARE_PROVIDER_SITE_OTHER): Payer: Medicare Other | Admitting: Neurology

## 2022-07-02 ENCOUNTER — Encounter: Payer: Self-pay | Admitting: Neurology

## 2022-07-02 VITALS — BP 125/73 | HR 81 | Ht 62.0 in | Wt 171.0 lb

## 2022-07-02 DIAGNOSIS — G629 Polyneuropathy, unspecified: Secondary | ICD-10-CM | POA: Diagnosis not present

## 2022-07-02 DIAGNOSIS — R413 Other amnesia: Secondary | ICD-10-CM | POA: Diagnosis not present

## 2022-07-02 DIAGNOSIS — H832X1 Labyrinthine dysfunction, right ear: Secondary | ICD-10-CM | POA: Insufficient documentation

## 2022-07-02 DIAGNOSIS — R42 Dizziness and giddiness: Secondary | ICD-10-CM | POA: Insufficient documentation

## 2022-07-02 DIAGNOSIS — I679 Cerebrovascular disease, unspecified: Secondary | ICD-10-CM

## 2022-07-02 DIAGNOSIS — I63313 Cerebral infarction due to thrombosis of bilateral middle cerebral arteries: Secondary | ICD-10-CM

## 2022-07-02 DIAGNOSIS — H9192 Unspecified hearing loss, left ear: Secondary | ICD-10-CM | POA: Insufficient documentation

## 2022-07-02 HISTORY — DX: Labyrinthine dysfunction, right ear: H83.2X1

## 2022-07-02 NOTE — Progress Notes (Signed)
Chief Complaint  Patient presents with   New Patient (Initial Visit)    Rm 20, with husband  NP/Paper/[facility]/Dhruv Vyas MD [phone], multiple lacunar infarcts, tremors/      ASSESSMENT AND PLAN  Jessica [name] is a 71 y.o. female     Small vessel Cerebrovascular disease,  Vascular risk factor of aging, hypertension, hyperlipidemia, history of chron's disease, lost follow-up with her GI doctor, obstructive sleep apnea, noncompliant with her CPAP, sedentary lifestyle  Advise her take aspirin 81 mg daily, increase water intake  Complete evaluation with ultrasound of carotid artery, laboratory evaluation including A1c, LDL  Echocardiogram showed normal ejection fraction, no wall motion abnormality, mild to moderate mitral valve regurgitation, no stenosis  Cognitive impairment    MoCA examination 18/30  Most likely central nervous system degenerative disorder  Laboratory evaluation to rule out treatable etiology  Emphasized the importance of safe driving, moderate exercise,  Return To Clinic for new issues, will inform result to patient, encouraged patient closely follow-up with her primary care physician and GI physician  DIAGNOSTIC DATA (LABS, IMAGING, TESTING) - I reviewed patient records, labs, notes, testing and imaging myself where available. ECHO in June 2023 Left ventricular ejection fraction, by estimation, is 55 to 60%. The left ventricle has normal function. The left ventricle has no regional wall motion abnormalities. Left ventricular diastolic parameters are consistent with Grade I diastolic dysfunction (impaired relaxation). The average left ventricular global longitudinal strain is -19.4 %. The global longitudinal strain is normal. 1. Right ventricular systolic function is normal. The right ventricular size is normal. There is normal pulmonary artery systolic pressure. 2. The mitral valve is abnormal. Mild to moderate mitral valve  regurgitation. No evidence of mitral stenosis. 3. 4. The tricuspid valve is abnormal. The aortic valve is tricuspid. Aortic valve regurgitation is not visualized. No aortic stenosis is present. 5. The inferior vena cava is normal in size with greater than 50% respiratory variability, suggesting right atrial pressure of 3 mmHg. 6. Comparison(s): TEE done 03/27/21 showed an EF of 60-65% with  MEDICAL HISTORY:  Jessica [name] is a 71 year old female, accompanied by her husband, seen in request by her primary care physician at [facility] internal medicine Dr  Woody Seller, Costella Hatcher, memory loss, MRI of the brain showed small vessel stroke, initial evaluation July 02, 2022   I reviewed and summarized the referring note. PMhx. HTN Hypothyrodism Restless leg syndrome Depression,  Crohn's disease, required with colectomy, in remission, lost follow up with her GI doctor. Right tibial fracture, s/p right knee, right hip replacement.  She retired at age 35 from [employer], mother suffered memory loss, she contributed to her manage mini stroke, she was noted to have gradual onset of memory loss since 2020, gradually getting worse, word finding difficulties, got lost while driving, reported 1 episode couple years ago, intended to drive to [place], and up at [facility], got frustrated, even has 2 GPS, she does not know how to forward or direction them, husband has to get her., she still drive locally, husband reported " can be scary sometimes"  She has significant bilateral knee pain, pending knee replacement by Dr. Wynelle Link, wants to have presurgical clearance,  She has been followed up by [facility] for right vestibular schwannoma, has regular MRI of the brain with without contrast, most recent 1 April 30, 2022 showed stable 2 mm enhancing lesion along the posterior aspect of her right IAC likely represent stable vestibular schwannoma, unchanged 8 mm manage  over overlying the right cerebral  hemisphere, there was incidental finding of punctuated focus of restricted diffusion within the cortex of bilateral parietal lobes, suggestive of acute/subacute infarction  Personally reviewed MRI of the brain, also from December 2020, there is evidence of small vessel disease  Echocardiogram in June 2023 showed normal ejection fraction no regional wall motion abnormality, mitral valve mild to moderate regurgitation, no stenosis, she is under the care of cardiologist Dr. Harl Bowie, most recent visit March 07, 2022, rare PACs, 1 runs of SVT, occasionally PVCs, underwent 3 days cardiac monitoring in May 2023 for palpitations     PHYSICAL EXAM:   Vitals:   07/02/22 0910  BP: 125/73  Pulse: 81  Weight: 171 lb (77.6 kg)  Height: 5' 2"  (1.575 m)      Body mass index is 31.28 kg/m.  PHYSICAL EXAMNIATION:  Gen: NAD, conversant, well nourised, well groomed                     Cardiovascular: Regular rate rhythm, no peripheral edema, warm, nontender. Eyes: Conjunctivae clear without exudates or hemorrhage Neck: Supple, no carotid bruits. Pulmonary: Clear to auscultation bilaterally   NEUROLOGICAL EXAM:  MENTAL STATUS: Speech/cognition: Awake, alert, oriented to history taking and casual conversation     07/02/2022    9:17 AM  Montreal Cognitive Assessment   Visuospatial/ Executive (0/5) 3  Naming (0/3) 2  Attention: Read list of digits (0/2) 1  Attention: Read list of letters (0/1) 1  Attention: Serial 7 subtraction starting at 100 (0/3) 1  Language: Repeat phrase (0/2) 1  Language : Fluency (0/1) 1  Abstraction (0/2) 2  Delayed Recall (0/5) 0  Orientation (0/6) 6  Total 18    CRANIAL NERVES: CN II: Visual fields are full to confrontation. Pupils are round equal and briskly reactive to light. CN III, IV, VI: extraocular movement are normal. No ptosis. CN V: Facial sensation is intact to light touch CN VII: Face is symmetric with normal eye closure  CN VIII: Hearing is  normal to causal conversation. CN IX, X: Phonation is normal. CN XI: Head turning and shoulder shrug are intact  MOTOR: There is no pronator drift of out-stretched arms. Muscle bulk and tone are normal. Muscle strength is normal.  REFLEXES: Reflexes are 2+ and symmetric at the biceps, triceps, knees, and ankles. Plantar responses are flexor.  SENSORY: Intact to light touch, pinprick and vibratory sensation are intact in fingers and toes.  COORDINATION: There is no trunk or limb dysmetria noted.  GAIT/STANCE: Need push-up to get up from seated position, mildly antalgic  REVIEW OF SYSTEMS:  Full 14 system review of systems performed and notable only for as above All other review of systems were negative.   ALLERGIES: Allergies  Allergen Reactions   Morphine And Related Hives and Itching    May cause blood pressure to drop   Robaxin [Methocarbamol] Itching   Sulfa Antibiotics Itching    ALL SULFA DRUGS    HOME MEDICATIONS: Current Outpatient Medications  Medication Sig Dispense Refill   amLODipine (NORVASC) 10 MG tablet TAKE 1 TABLET BY MOUTH ONCE DAILY **THIS  IS  A  DOSE  INCREASE** 90 tablet 1   dicyclomine (BENTYL) 10 MG capsule Take 1 capsule (10 mg total) by mouth 2 (two) times daily before a meal. 60 capsule 5   fluticasone (FLONASE) 50 MCG/ACT nasal spray Place 1 spray into the nose daily as needed for allergies or rhinitis.  hydrALAZINE (APRESOLINE) 50 MG tablet Take 50 mg by mouth 2 (two) times daily.     HYDROcodone-acetaminophen (NORCO/VICODIN) 5-325 MG tablet Take 1 tablet by mouth at bedtime.     levothyroxine (SYNTHROID) 100 MCG tablet Take 100 mcg by mouth daily.     loperamide (IMODIUM) 2 MG capsule TAKE 1 CAPSULE BY MOUTH TWICE DAILY AS NEEDED FOR LOOSE STOOLS 180 capsule 1   metoprolol succinate (TOPROL-XL) 25 MG 24 hr tablet Take 1.5 tablets (37.5 mg total) by mouth daily. 45 tablet 6   Multiple Vitamin (MULTIVITAMIN WITH MINERALS) TABS tablet Take 1  tablet by mouth daily.     pantoprazole (PROTONIX) 40 MG tablet Take 1 tablet (40 mg total) by mouth daily before breakfast. (Patient taking differently: Take 40 mg by mouth daily as needed (acid reflux).) 90 tablet 3   rOPINIRole (REQUIP) 0.5 MG tablet Take 0.5 mg by mouth in the morning and at bedtime.     rosuvastatin (CRESTOR) 5 MG tablet Take 5 mg by mouth daily.     sertraline (ZOLOFT) 100 MG tablet Take 1.5 tablets (150 mg total) by mouth daily. 135 tablet 3   spironolactone (ALDACTONE) 50 MG tablet Take 50 mg by mouth daily.     potassium chloride (KLOR-CON) 10 MEQ tablet Take 10 mEq by mouth 2 (two) times daily.     No current facility-administered medications for this visit.    PAST MEDICAL HISTORY: Past Medical History:  Diagnosis Date   Anxiety attack    Arthritis    Back pain, chronic    Chronic kidney disease    Crohn's colitis ([place])    Depression (emotion)    Dysrhythmia    GERD (gastroesophageal reflux disease)    Hypertension    Hypothyroid    Neuropathy    Sleep apnea    cpap is not working per patient - needs to find a new doctor - not used since 7/16 per pat    Stage 3 chronic kidney disease ([place])    Vertigo    followed by Dr Wardell Heath- in [place]: Past Surgical History:  Procedure Laterality Date   ABDOMINAL HYSTERECTOMY     BIOPSY  06/08/2018   Procedure: BIOPSY;  Surgeon: Rogene Houston, MD;  [facility];  Service: Endoscopy;;  (colon)   CHOLECYSTECTOMY     COLON SURGERY     for Crohn's in the 80s in [place]   COLONOSCOPY N/A 03/11/2013   Procedure: COLONOSCOPY;  Surgeon: Rogene Houston, MD;  [facility];  Service: Endoscopy;  Laterality: N/A;  1200   COLONOSCOPY N/A 06/08/2018   Procedure: COLONOSCOPY;  Surgeon: Rogene Houston, MD;  [facility];  Service: Endoscopy;  Laterality: N/A;  8:25   fatty tumor removed     HARDWARE REMOVAL Right 08/07/2016   Procedure: HARDWARE REMOVAL;  Surgeon:  Gaynelle Arabian, MD;  Location: [facility];  Service: Orthopedics;  Laterality: Right;   Hip replacement      Rt hip in 2010 in [place] ARTHROSCOPY Left 03/29/2015   Procedure: ARTHROSCOPY LEFT KNEE WITH MENSICAL DEBRIDEMENT, chondroplasty;  Surgeon: Gaynelle Arabian, MD;  [facility];  Service: Orthopedics;  Laterality: Left;   ORIF TIBIA PLATEAU Right 02/29/2016   Procedure: OPEN REDUCTION INTERNAL FIXATION (ORIF RIGHT  TIBIAL PLATEAU FRACTURE;  Surgeon: Gaynelle Arabian, MD;  [facility];  Service: Orthopedics;  Laterality: Right;   TEE WITHOUT CARDIOVERSION N/A 03/27/2021  Procedure: TRANSESOPHAGEAL ECHOCARDIOGRAM (TEE);  Surgeon: Arnoldo Lenis, MD;  [facility];  Service: Endoscopy;  Laterality: N/A;   TOTAL KNEE ARTHROPLASTY Right 08/25/2017   Procedure: TOTAL RIGHT KNEE ARTHROPLASTY;  Surgeon: Gaynelle Arabian, MD;  [facility];  Service: Orthopedics;  Laterality: Right;    FAMILY HISTORY: Family History  Problem Relation Age of Onset   Crohn's disease Brother    Depression Mother    Anxiety disorder Mother    Depression Sister    Aneurysm Father    Colon cancer Neg Hx     SOCIAL HISTORY: Social History   Socioeconomic History   Marital status: Married    Spouse name: Not on file   Number of children: Not on file   Years of education: Not on file   Highest education level: Not on file  Occupational History   Not on file  Tobacco Use   Smoking status: Never   Smokeless tobacco: Never  Vaping Use   Vaping Use: Never used  Substance and Sexual Activity   Alcohol use: No    Alcohol/week: 0.0 standard drinks of alcohol   Drug use: No   Sexual activity: Not Currently  Other Topics Concern   Not on file  Social History Narrative   Not on file   Social Determinants of Health   Financial Resource Strain: Not on file  Food Insecurity: Not on file  Transportation Needs: Not on file  Physical Activity: Not on file  Stress: Not on file   Social Connections: Not on file  Intimate Partner Violence: Not on file      Marcial Pacas, M.D. Ph.D.  [facility] [address] [place], [place] [address] Ph: [phone] Fax: [phone]  CC:  Glenda Chroman, MD [place],  [place] [phone]  Glenda Chroman, MD

## 2022-07-03 LAB — LIPID PANEL
Chol/HDL Ratio: 3.6 ratio (ref 0.0–4.4)
Cholesterol, Total: 162 mg/dL (ref 100–199)
HDL: 45 mg/dL (ref >39)
LDL Chol Calc (NIH): 97 mg/dL (ref 0–99)
Triglycerides: 110 mg/dL (ref 0–149)
VLDL Cholesterol Cal: 20 mg/dL (ref 5–40)

## 2022-07-03 LAB — VITAMIN B12: Vitamin B-12: 398 pg/mL (ref 232–1245)

## 2022-07-03 LAB — RPR: RPR Ser Ql: NONREACTIVE

## 2022-07-03 LAB — HGB A1C W/O EAG: Hgb A1c MFr Bld: 5.1 % (ref 4.8–5.6)

## 2022-07-03 LAB — TSH: TSH: 1.91 u[IU]/mL (ref 0.450–4.500)

## 2022-07-05 DIAGNOSIS — I10 Essential (primary) hypertension: Secondary | ICD-10-CM | POA: Diagnosis not present

## 2022-07-11 DIAGNOSIS — I10 Essential (primary) hypertension: Secondary | ICD-10-CM | POA: Diagnosis not present

## 2022-07-11 DIAGNOSIS — Z299 Encounter for prophylactic measures, unspecified: Secondary | ICD-10-CM | POA: Diagnosis not present

## 2022-07-11 DIAGNOSIS — M5416 Radiculopathy, lumbar region: Secondary | ICD-10-CM | POA: Diagnosis not present

## 2022-08-05 DIAGNOSIS — I10 Essential (primary) hypertension: Secondary | ICD-10-CM | POA: Diagnosis not present

## 2022-08-15 ENCOUNTER — Other Ambulatory Visit (HOSPITAL_COMMUNITY): Payer: Self-pay | Admitting: Psychiatry

## 2022-08-15 DIAGNOSIS — F331 Major depressive disorder, recurrent, moderate: Secondary | ICD-10-CM

## 2022-08-15 NOTE — Telephone Encounter (Signed)
LMOM

## 2022-08-15 NOTE — Telephone Encounter (Signed)
Call for appt, not seen in one year

## 2022-08-15 NOTE — Telephone Encounter (Signed)
Pt called back in advised of Dr Alan Ripper message she states that she will be out of town for 2 weeks and will call back to schedule when she gets back

## 2022-08-19 DIAGNOSIS — M25562 Pain in left knee: Secondary | ICD-10-CM | POA: Diagnosis not present

## 2022-09-04 DIAGNOSIS — D692 Other nonthrombocytopenic purpura: Secondary | ICD-10-CM | POA: Diagnosis not present

## 2022-09-04 DIAGNOSIS — J3489 Other specified disorders of nose and nasal sinuses: Secondary | ICD-10-CM | POA: Diagnosis not present

## 2022-09-04 DIAGNOSIS — I10 Essential (primary) hypertension: Secondary | ICD-10-CM | POA: Diagnosis not present

## 2022-09-04 DIAGNOSIS — J069 Acute upper respiratory infection, unspecified: Secondary | ICD-10-CM | POA: Diagnosis not present

## 2022-09-17 ENCOUNTER — Telehealth (HOSPITAL_COMMUNITY): Payer: Self-pay

## 2022-09-17 NOTE — Telephone Encounter (Signed)
Yes it's okay.

## 2022-09-17 NOTE — Telephone Encounter (Signed)
Pt called in asking to schedule an appt with Dr Harrington Challenger. Pt last seen 08/23/21. Please advise if ok to schedule.

## 2022-09-17 NOTE — Telephone Encounter (Signed)
PT SCHEDULED

## 2022-09-18 ENCOUNTER — Ambulatory Visit: Payer: Medicare Other | Attending: Cardiology | Admitting: Cardiology

## 2022-09-18 VITALS — BP 114/64 | HR 72 | Ht 62.0 in | Wt 165.2 lb

## 2022-09-18 DIAGNOSIS — R002 Palpitations: Secondary | ICD-10-CM | POA: Diagnosis not present

## 2022-09-18 DIAGNOSIS — I340 Nonrheumatic mitral (valve) insufficiency: Secondary | ICD-10-CM | POA: Insufficient documentation

## 2022-09-18 DIAGNOSIS — Z01810 Encounter for preprocedural cardiovascular examination: Secondary | ICD-10-CM | POA: Insufficient documentation

## 2022-09-18 DIAGNOSIS — R0609 Other forms of dyspnea: Secondary | ICD-10-CM | POA: Insufficient documentation

## 2022-09-18 NOTE — Patient Instructions (Addendum)

## 2022-09-18 NOTE — Progress Notes (Signed)
Clinical Summary Ms. Foister is a 72 y.o.female seen today for follow up of the following meidcal problems.    1.Palpitations - can occur at rest or with activity - feeling of heart pounding. No other associated symptoms. Symptoms on and off throughout the day. - no coffee, rare sodas, occasional tea, no energy drinks, no EtOH - recent family stress   11/2021 3 day monitor: rare PACs, one runs SVT x 8 beats. Occasoinal PVCs (1.3%).  - some racing at times, about 2-3 times per week. Lasts just a few seconds. Overall tolerable     2. Mitral regurgitation - severe MR from pcp note   02/2021 echo at [facility]: LVEF >55%, severe MR. LVIDs 2.6  03/2021 TEE: moderate MR 12/2020 TSH 8.96 Hgb 13 Plt 169 TSH 8.96    12/2021 echo: LVEF 55-60%, grade I dd, normal RV, mild to mod MR,      3. [place] with walking just in from parking lot - no chest pains - some recent LE edema x 3-4 months progressing - had been chlothalidone nephrology stopped. Taking aldactone '25mg'$  daily.    03/2022 nuclear stress: no ischemia   4.HTN - neprhology stopped thiaizde diuretic, started hydralazine - she is compliant with meds - home bp's 110s/60s         5.OSA - followed by Dr Elsworth Soho - mixed compliance with cpap, has f/u next week     5. CKD - followed by Dr Theador Hawthorne   6.Hypothyroidism - last TSH was elevated from 12/2020, perhaps playing a role in her fatigue     7. Chronic fatigue     8. Preop evaluation - considering knee replacement - cardiac testing has been benign    Past Medical History:  Diagnosis Date   Anxiety attack    Arthritis    Back pain, chronic    Chronic kidney disease    Crohn's colitis ([place])    Depression (emotion)    Dysrhythmia    GERD (gastroesophageal reflux disease)    Hypertension    Hypothyroid    Neuropathy    Sleep apnea    cpap is not working per patient - needs to find a new doctor - not used since 7/16 per pat    Stage 3 chronic kidney  disease ([facility])    Vertigo    followed by Dr Wardell Heath- in [place]      Allergies  Allergen Reactions   Morphine And Related Hives and Itching    May cause blood pressure to drop   Robaxin [Methocarbamol] Itching   Sulfa Antibiotics Itching    ALL SULFA DRUGS     Current Outpatient Medications  Medication Sig Dispense Refill   amLODipine (NORVASC) 10 MG tablet TAKE 1 TABLET BY MOUTH ONCE DAILY **THIS  IS  A  DOSE  INCREASE** 90 tablet 1   dicyclomine (BENTYL) 10 MG capsule Take 1 capsule (10 mg total) by mouth 2 (two) times daily before a meal. 60 capsule 5   fluticasone (FLONASE) 50 MCG/ACT nasal spray Place 1 spray into the nose daily as needed for allergies or rhinitis.     hydrALAZINE (APRESOLINE) 50 MG tablet Take 50 mg by mouth 2 (two) times daily.     HYDROcodone-acetaminophen (NORCO/VICODIN) 5-325 MG tablet Take 1 tablet by mouth at bedtime.     levothyroxine (SYNTHROID) 100 MCG tablet Take 100 mcg by mouth daily.     loperamide (IMODIUM) 2 MG capsule TAKE 1  CAPSULE BY MOUTH TWICE DAILY AS NEEDED FOR LOOSE STOOLS 180 capsule 1   metoprolol succinate (TOPROL-XL) 25 MG 24 hr tablet Take 1.5 tablets (37.5 mg total) by mouth daily. 45 tablet 6   Multiple Vitamin (MULTIVITAMIN WITH MINERALS) TABS tablet Take 1 tablet by mouth daily.     pantoprazole (PROTONIX) 40 MG tablet Take 1 tablet (40 mg total) by mouth daily before breakfast. (Patient taking differently: Take 40 mg by mouth daily as needed (acid reflux).) 90 tablet 3   potassium chloride (KLOR-CON) 10 MEQ tablet Take 10 mEq by mouth 2 (two) times daily.     rOPINIRole (REQUIP) 0.5 MG tablet Take 0.5 mg by mouth in the morning and at bedtime.     rosuvastatin (CRESTOR) 5 MG tablet Take 5 mg by mouth daily.     sertraline (ZOLOFT) 100 MG tablet TAKE 1 & 1/2 (ONE & ONE-HALF) TABLETS BY MOUTH ONCE DAILY 135 tablet 0   spironolactone (ALDACTONE) 50 MG tablet Take 50 mg by mouth daily.     No current facility-administered medications  for this visit.     Past Surgical History:  Procedure Laterality Date   ABDOMINAL HYSTERECTOMY     BIOPSY  06/08/2018   Procedure: BIOPSY;  Surgeon: Rogene Houston, MD;  [facility];  Service: Endoscopy;;  (colon)   CHOLECYSTECTOMY     COLON SURGERY     for Crohn's in the 80s in [place]   COLONOSCOPY N/A 03/11/2013   Procedure: COLONOSCOPY;  Surgeon: Rogene Houston, MD;  [facility];  Service: Endoscopy;  Laterality: N/A;  1200   COLONOSCOPY N/A 06/08/2018   Procedure: COLONOSCOPY;  Surgeon: Rogene Houston, MD;  [facility];  Service: Endoscopy;  Laterality: N/A;  8:25   fatty tumor removed     HARDWARE REMOVAL Right 08/07/2016   Procedure: HARDWARE REMOVAL;  Surgeon: Gaynelle Arabian, MD;  [facility];  Service: Orthopedics;  Laterality: Right;   Hip replacement      Rt hip in 2010 in [place] ARTHROSCOPY Left 03/29/2015   Procedure: ARTHROSCOPY LEFT KNEE WITH MENSICAL DEBRIDEMENT, chondroplasty;  Surgeon: Gaynelle Arabian, MD;  [facility];  Service: Orthopedics;  Laterality: Left;   ORIF TIBIA PLATEAU Right 02/29/2016   Procedure: OPEN REDUCTION INTERNAL FIXATION (ORIF RIGHT  TIBIAL PLATEAU FRACTURE;  Surgeon: Gaynelle Arabian, MD;  [facility];  Service: Orthopedics;  Laterality: Right;   TEE WITHOUT CARDIOVERSION N/A 03/27/2021   Procedure: TRANSESOPHAGEAL ECHOCARDIOGRAM (TEE);  Surgeon: Arnoldo Lenis, MD;  [facility];  Service: Endoscopy;  Laterality: N/A;   TOTAL KNEE ARTHROPLASTY Right 08/25/2017   Procedure: TOTAL RIGHT KNEE ARTHROPLASTY;  Surgeon: Gaynelle Arabian, MD;  [facility];  Service: Orthopedics;  Laterality: Right;     Allergies  Allergen Reactions   Morphine And Related Hives and Itching    May cause blood pressure to drop   Robaxin [Methocarbamol] Itching   Sulfa Antibiotics Itching    ALL SULFA DRUGS      Family History  Problem Relation Age of Onset   Crohn's disease Brother     Depression Mother    Anxiety disorder Mother    Depression Sister    Aneurysm Father    Colon cancer Neg Hx      Social History Ms. Ferderer reports that she has never smoked. She has never used smokeless tobacco. Ms. Hephner reports no history of alcohol use.   Review of Systems  CONSTITUTIONAL: No weight loss, fever, chills, weakness or fatigue.  HEENT: Eyes: No visual loss, blurred vision, double vision or yellow sclerae.No hearing loss, sneezing, congestion, runny nose or sore throat.  SKIN: No rash or itching.  CARDIOVASCULAR: per hpi RESPIRATORY: chronic SOB  GASTROINTESTINAL: No anorexia, nausea, vomiting or diarrhea. No abdominal pain or blood.  GENITOURINARY: No burning on urination, no polyuria NEUROLOGICAL: No headache, dizziness, syncope, paralysis, ataxia, numbness or tingling in the extremities. No change in bowel or bladder control.  MUSCULOSKELETAL: No muscle, back pain, joint pain or stiffness.  LYMPHATICS: No enlarged nodes. No history of splenectomy.  PSYCHIATRIC: No history of depression or anxiety.  ENDOCRINOLOGIC: No reports of sweating, cold or heat intolerance. No polyuria or polydipsia.  Marland Kitchen   Physical Examination Today's Vitals   09/18/22 1122  BP: 114/64  Pulse: 72  SpO2: 97%  Weight: 165 lb 3.2 oz (74.9 kg)  Height: '5\' 2"'$  (1.575 m)   Body mass index is 30.22 kg/m.  Gen: resting comfortably, no acute distress HEENT: no scleral icterus, pupils equal round and reactive, no palptable cervical adenopathy,  CV: RRR, 2/6 systolic murmur apex, no jvd Resp: Clear to auscultation bilaterally GI: abdomen is soft, non-tender, non-distended, normal bowel sounds, no hepatosplenomegaly MSK: extremities are warm, no edema.  Skin: warm, no rash Neuro:  no focal deficits Psych: appropriate affect   Diagnostic Studies 03/2021 echo IMPRESSIONS     1. Left ventricular ejection fraction, by estimation, is 60 to 65%. The  left ventricle has normal function. The  left ventricle has no regional  wall motion abnormalities.   2. Right ventricular systolic function is normal. The right ventricular  size is normal.   3. No left atrial/left atrial appendage thrombus was detected. The LAA  emptying velocity was 54 cm/s.   4. The MR vena contracta is 0.4 cm. . The mitral valve is abnormal.  Moderate mitral valve regurgitation. No evidence of mitral stenosis.   5. The aortic valve is tricuspid. Aortic valve regurgitation is not  visualized. No aortic stenosis is present.      11/2021 monitor 3 day monitor Rare supraventricular ectopy in the form of isolated PACs, couplets, triplets. One run of SVT 8 beats. Occasional ventricular ectopy in the form of isolated PVCs, couplets No reported symptoms    03/2022 nuclear stress The study is normal. The study is low risk.   No ST deviation was noted.   LV perfusion is normal. Small apical defect in resting and stress images with normal wall motion consistent with apical thinning.   Left ventricular function is normal. End diastolic cavity size is normal.   Assessment and Plan   1.Palpitations -heart monitor with primarily benign ectopy. - mild symptoms at times are tolerable, continue toprol.    2. Mitral regurgitation - 12/2021 echo mild to moderate MR, not clinically significant. - we will repeat echo later this year after next visit.    3. LE edema - AKI when on diuretic before, neprhology stopped - suspect related to venous insufficiency due to age, weight, prior leg surgeries -no further cardiac workup indicated   4. DOE - unclear etiology - benign echo and stress test, no further cardiac testing is planned     5. Preoperative evaluation - considering knee replacement.  - benign echo and stress testing, recommend proceeding from cardiac standpoint   F/u 6 months  Arnoldo Lenis, M.D.

## 2022-09-24 ENCOUNTER — Encounter: Payer: Self-pay | Admitting: Neurology

## 2022-09-24 ENCOUNTER — Ambulatory Visit (INDEPENDENT_AMBULATORY_CARE_PROVIDER_SITE_OTHER): Payer: Medicare Other | Admitting: Neurology

## 2022-09-24 VITALS — BP 118/66 | HR 79 | Ht 62.0 in | Wt 162.0 lb

## 2022-09-24 DIAGNOSIS — H9311 Tinnitus, right ear: Secondary | ICD-10-CM | POA: Insufficient documentation

## 2022-09-24 DIAGNOSIS — G3184 Mild cognitive impairment, so stated: Secondary | ICD-10-CM

## 2022-09-24 DIAGNOSIS — G2581 Restless legs syndrome: Secondary | ICD-10-CM

## 2022-09-24 DIAGNOSIS — G250 Essential tremor: Secondary | ICD-10-CM | POA: Diagnosis not present

## 2022-09-24 HISTORY — DX: Tinnitus, right ear: H93.11

## 2022-09-24 HISTORY — DX: Mild cognitive impairment of uncertain or unknown etiology: G31.84

## 2022-09-24 HISTORY — DX: Essential tremor: G25.0

## 2022-09-24 MED ORDER — PROPRANOLOL HCL 20 MG PO TABS: 20.0000 mg | ORAL_TABLET | Freq: Three times a day (TID) | ORAL | 11 refills | Status: DC | PRN

## 2022-09-24 NOTE — Progress Notes (Signed)
Chief Complaint  Patient presents with   Follow-up    Rm 15. Alone. Tremors have not improved. She does not report any memory changes.      ASSESSMENT AND PLAN  Jessica [name] is a 72 y.o. female     Small vessel Cerebrovascular disease,  Vascular risk factor of aging, hypertension, hyperlipidemia, history of chron's disease, lost follow-up with her GI doctor, obstructive sleep apnea, noncompliant with her CPAP, sedentary lifestyle  Advise her take aspirin 81 mg daily, increase water intake    Cognitive impairment    MoCA examination 18/30  Most likely central nervous system degenerative disorder  Laboratory evaluation showed normal TSH, B12, RPR, no treatable etiology  Emphasized the importance of safe driving, moderate exercise,  Restless leg syndrome  Improved by Requip 0.5 mg every night,  May consider adding gabapentin as needed  Continue follow-up with primary care physician, return to clinic as needed   DIAGNOSTIC DATA (LABS, IMAGING, TESTING) - I reviewed patient records, labs, notes, testing and imaging myself where available. ECHO in June 2023 Left ventricular ejection fraction, by estimation, is 55 to 60%. The left ventricle has normal function. The left ventricle has no regional wall motion abnormalities. Left ventricular diastolic parameters are consistent with Grade I diastolic dysfunction (impaired relaxation). The average left ventricular global longitudinal strain is -19.4 %. The global longitudinal strain is normal. 1. Right ventricular systolic function is normal. The right ventricular size is normal. There is normal pulmonary artery systolic pressure. 2. The mitral valve is abnormal. Mild to moderate mitral valve regurgitation. No evidence of mitral stenosis. 3. 4. The tricuspid valve is abnormal. The aortic valve is tricuspid. Aortic valve regurgitation is not visualized. No aortic stenosis is present. 5. The inferior vena cava is normal in  size with greater than 50% respiratory variability, suggesting right atrial pressure of 3 mmHg. 6. Comparison(s):   TEE done 03/27/21 showed an EF of 60-65% moderate mitral valve regurgitation, no thrombosis  MEDICAL HISTORY:  Jessica [name] is a 72 year old female, accompanied by her husband, seen in request by her primary care physician at [facility] internal medicine Dr  Woody Seller, Costella Hatcher, memory loss, MRI of the brain showed small vessel stroke, initial evaluation July 02, 2022   I reviewed and summarized the referring note. PMhx. HTN Hypothyrodism Restless leg syndrome Depression,  Crohn's disease, required with colectomy, in remission, lost follow up with her GI doctor. Right tibial fracture, s/p right knee, right hip replacement.  She retired at age 36 from [employer], mother suffered memory loss, she contributed to her manage mini stroke, she was noted to have gradual onset of memory loss since 2020, gradually getting worse, word finding difficulties, got lost while driving, reported 1 episode couple years ago, intended to drive to [place], and up at [facility], got frustrated, even has 2 GPS, she does not know how to forward or direction them, husband has to get her., she still drive locally, husband reported " can be scary sometimes"  She has significant bilateral knee pain, pending knee replacement by Dr. Wynelle Link, wants to have presurgical clearance,  She has been followed up by [facility] ENT for right vestibular schwannoma, has regular MRI of the brain with without contrast, most recent 1 April 30, 2022 showed stable 2 mm enhancing lesion along the posterior aspect of her right IAC likely represent stable vestibular schwannoma, unchanged 8 mm manage over overlying the right cerebral hemisphere, there was incidental finding of punctuated focus of restricted  diffusion within the cortex of bilateral parietal lobes, suggestive of acute/subacute infarction  Personally reviewed  MRI of the brain, also from December 2020, there is evidence of small vessel disease  Echocardiogram in June 2023 showed normal ejection fraction no regional wall motion abnormality, mitral valve mild to moderate regurgitation, no stenosis, she is under the care of cardiologist Dr. Harl Bowie, most recent visit March 07, 2022, rare PACs, 1 runs of SVT, occasionally PVCs, underwent 3 days cardiac monitoring in May 2023 for palpitations  UPDATE September 24 2022: She continue complains of memory loss, bilateral knee pain, difficulty walking due to pain  Personally reviewed MRI again, mild small vessel disease no acute abnormality  Labs in Dec 2023: normal lipid panel, TSH, RPR, B12,  A1C 5.1,   She has a long history of bilateral hand tremor, getting worse with anxiety, more obvious as she ages, mother has tremor,  Restless leg syndrome, improved by taking Requip 0.5 mg every night,  PHYSICAL EXAM:   Vitals:   09/24/22 1332  BP: 118/66  Pulse: 79  Weight: 162 lb (73.5 kg)  Height: 5\' 2"  (1.575 m)    Body mass index is 29.63 kg/m.  PHYSICAL EXAMNIATION:  Gen: NAD, conversant, well nourised, well groomed                     Cardiovascular: Regular rate rhythm, no peripheral edema, warm, nontender. Eyes: Conjunctivae clear without exudates or hemorrhage Neck: Supple, no carotid bruits. Pulmonary: Clear to auscultation bilaterally   NEUROLOGICAL EXAM:  MENTAL STATUS: Speech/cognition: Awake, alert, oriented to history taking and casual conversation     07/02/2022    9:17 AM  Montreal Cognitive Assessment   Visuospatial/ Executive (0/5) 3  Naming (0/3) 2  Attention: Read list of digits (0/2) 1  Attention: Read list of letters (0/1) 1  Attention: Serial 7 subtraction starting at 100 (0/3) 1  Language: Repeat phrase (0/2) 1  Language : Fluency (0/1) 1  Abstraction (0/2) 2  Delayed Recall (0/5) 0  Orientation (0/6) 6  Total 18    CRANIAL NERVES: CN II: Visual fields are full  to confrontation. Pupils are round equal and briskly reactive to light. CN III, IV, VI: extraocular movement are normal. No ptosis. CN V: Facial sensation is intact to light touch CN VII: Face is symmetric with normal eye closure  CN VIII: Hearing is normal to causal conversation. CN IX, X: Phonation is normal. CN XI: Head turning and shoulder shrug are intact  MOTOR: No visible hand posturing tremor, normal strength, no significant rigidity, bradykinesia, mild difficulty drawing spiral circle  REFLEXES: Reflexes are 1 and symmetric at the biceps, triceps, knees, and ankles. Plantar responses are flexor.  SENSORY: Intact to light touch, pinprick and vibratory sensation are intact in fingers and toes.  COORDINATION: There is no trunk or limb dysmetria noted.  GAIT/STANCE: Need push-up to get up from seated position, mildly antalgic  REVIEW OF SYSTEMS:  Full 14 system review of systems performed and notable only for as above All other review of systems were negative.   ALLERGIES: Allergies  Allergen Reactions   Morphine And Related Hives and Itching    May cause blood pressure to drop   Robaxin [Methocarbamol] Itching   Sulfa Antibiotics Itching    ALL SULFA DRUGS    HOME MEDICATIONS: Current Outpatient Medications  Medication Sig Dispense Refill   amLODipine (NORVASC) 10 MG tablet TAKE 1 TABLET BY MOUTH ONCE DAILY **THIS  IS  A  DOSE  INCREASE** 90 tablet 1   dicyclomine (BENTYL) 10 MG capsule Take 1 capsule (10 mg total) by mouth 2 (two) times daily before a meal. 60 capsule 5   fluticasone (FLONASE) 50 MCG/ACT nasal spray Place 1 spray into the nose daily as needed for allergies or rhinitis.     hydrALAZINE (APRESOLINE) 50 MG tablet Take 50 mg by mouth 2 (two) times daily.     HYDROcodone-acetaminophen (NORCO/VICODIN) 5-325 MG tablet Take 1 tablet by mouth at bedtime.     levothyroxine (SYNTHROID) 100 MCG tablet Take 100 mcg by mouth daily.     loperamide (IMODIUM) 2 MG  capsule TAKE 1 CAPSULE BY MOUTH TWICE DAILY AS NEEDED FOR LOOSE STOOLS 180 capsule 1   metoprolol succinate (TOPROL-XL) 25 MG 24 hr tablet Take 1.5 tablets (37.5 mg total) by mouth daily. 45 tablet 6   Multiple Vitamin (MULTIVITAMIN WITH MINERALS) TABS tablet Take 1 tablet by mouth daily.     pantoprazole (PROTONIX) 40 MG tablet Take 1 tablet (40 mg total) by mouth daily before breakfast. (Patient taking differently: Take 40 mg by mouth daily as needed (acid reflux).) 90 tablet 3   rOPINIRole (REQUIP) 0.5 MG tablet Take 0.5 mg by mouth in the morning and at bedtime.     rosuvastatin (CRESTOR) 5 MG tablet Take 5 mg by mouth daily.     sertraline (ZOLOFT) 100 MG tablet TAKE 1 & 1/2 (ONE & ONE-HALF) TABLETS BY MOUTH ONCE DAILY 135 tablet 0   spironolactone (ALDACTONE) 50 MG tablet Take 50 mg by mouth daily.     potassium chloride (KLOR-CON) 10 MEQ tablet Take 10 mEq by mouth 2 (two) times daily.     No current facility-administered medications for this visit.    PAST MEDICAL HISTORY: Past Medical History:  Diagnosis Date   Anxiety attack    Arthritis    Back pain, chronic    Chronic kidney disease    Crohn's colitis ([place])    Depression (emotion)    Dysrhythmia    GERD (gastroesophageal reflux disease)    Hypertension    Hypothyroid    Neuropathy    Sleep apnea    cpap is not working per patient - needs to find a new doctor - not used since 7/16 per pat    Stage 3 chronic kidney disease ([place])    Vertigo    followed by Dr Wardell Heath- in [place]: Past Surgical History:  Procedure Laterality Date   ABDOMINAL HYSTERECTOMY     BIOPSY  06/08/2018   Procedure: BIOPSY;  Surgeon: Rogene Houston, MD;  Location: [facility];  Service: Endoscopy;;  (colon)   CHOLECYSTECTOMY     COLON SURGERY     for Crohn's in the 80s in [place]   COLONOSCOPY N/A 03/11/2013   Procedure: COLONOSCOPY;  Surgeon: Rogene Houston, MD;  Location: [facility];  Service: Endoscopy;   Laterality: N/A;  1200   COLONOSCOPY N/A 06/08/2018   Procedure: COLONOSCOPY;  Surgeon: Rogene Houston, MD;  Location: [facility];  Service: Endoscopy;  Laterality: N/A;  8:25   fatty tumor removed     HARDWARE REMOVAL Right 08/07/2016   Procedure: HARDWARE REMOVAL;  Surgeon: Gaynelle Arabian, MD;  Location: [facility];  Service: Orthopedics;  Laterality: Right;   Hip replacement      Rt hip in 2010 in [place] Left 03/29/2015   Procedure: ARTHROSCOPY LEFT KNEE WITH  MENSICAL DEBRIDEMENT, chondroplasty;  Surgeon: Gaynelle Arabian, MD;  Location: [facility];  Service: Orthopedics;  Laterality: Left;   ORIF TIBIA PLATEAU Right 02/29/2016   Procedure: OPEN REDUCTION INTERNAL FIXATION (ORIF RIGHT  TIBIAL PLATEAU FRACTURE;  Surgeon: Gaynelle Arabian, MD;  Location: [facility];  Service: Orthopedics;  Laterality: Right;   TEE WITHOUT CARDIOVERSION N/A 03/27/2021   Procedure: TRANSESOPHAGEAL ECHOCARDIOGRAM (TEE);  Surgeon: Arnoldo Lenis, MD;  [facility]: [facility];  Service: Endoscopy;  Laterality: N/A;   TOTAL KNEE ARTHROPLASTY Right 08/25/2017   Procedure: TOTAL RIGHT KNEE ARTHROPLASTY;  Surgeon: Gaynelle Arabian, MD;  Location: [facility];  Service: Orthopedics;  Laterality: Right;    FAMILY HISTORY: Family History  Problem Relation Age of Onset   Crohn's disease Brother    Depression Mother    Anxiety disorder Mother    Depression Sister    Aneurysm Father    Colon cancer Neg Hx     SOCIAL HISTORY: Social History   Socioeconomic History   Marital status: Married    Spouse name: Not on file   Number of children: Not on file   Years of education: Not on file   Highest education level: Not on file  Occupational History   Not on file  Tobacco Use   Smoking status: Never   Smokeless tobacco: Never  Vaping Use   Vaping Use: Never used  Substance and Sexual Activity   Alcohol use: No    Alcohol/week: 0.0 standard drinks of alcohol   Drug use: No   Sexual activity: Not Currently   Other Topics Concern   Not on file  Social History Narrative   Not on file   Social Determinants of Health   Financial Resource Strain: Not on file  Food Insecurity: Not on file  Transportation Needs: Not on file  Physical Activity: Not on file  Stress: Not on file  Social Connections: Not on file  Intimate Partner Violence: Not on file      Marcial Pacas, M.D. Ph.D.  [facility] [address], [place], [place] [address] Ph: [phone] Fax: [phone]  CC:  Glenda Chroman, MD [place],  [place] [address]  Glenda Chroman, MD

## 2022-09-26 DIAGNOSIS — M25562 Pain in left knee: Secondary | ICD-10-CM | POA: Diagnosis not present

## 2022-09-27 ENCOUNTER — Encounter (HOSPITAL_BASED_OUTPATIENT_CLINIC_OR_DEPARTMENT_OTHER): Payer: Self-pay | Admitting: Pulmonary Disease

## 2022-09-27 ENCOUNTER — Ambulatory Visit (INDEPENDENT_AMBULATORY_CARE_PROVIDER_SITE_OTHER): Payer: Medicare Other | Admitting: Pulmonary Disease

## 2022-09-27 VITALS — BP 112/60 | HR 87 | Ht 62.0 in | Wt 168.4 lb

## 2022-09-27 DIAGNOSIS — G4733 Obstructive sleep apnea (adult) (pediatric): Secondary | ICD-10-CM | POA: Diagnosis not present

## 2022-09-27 NOTE — Progress Notes (Signed)
   Subjective:    Patient ID: Jessica [name], female    DOB: 09-11-1950, 72 y.o.   MRN: [id number]  HPI  72 yo for follow-up of OSA and dyspnea   PMH -Crohn's disease, hypertension, hyperlipidemia, CKD stage III, moderate MR -schwannoma RT ear  Last office visit 12/2021 we changed her to auto settings 4 to 13 cm.  We also referred her for mask desensitization but she was unable to get that appointment.  She states that she is still struggling with her CPAP machine she feels like the machine is a leak and wonders if the house has a leak if her cat damage it.  She is not able to use her machine all night.  She complains of loud snoring this is embarrassing especially when she goes camping.  She is up and down all night due to her CKD and this bothers her husband She is very distressed about it and wishes she could take care of this problem.   Significant tests/ events reviewed   01/2021 HST showed mod  OSA with AHI 28/ hr 03/2021 TEE mod MR   Review of Systems neg for any significant sore throat, dysphagia, itching, sneezing, nasal congestion or excess/ purulent secretions, fever, chills, sweats, unintended wt loss, pleuritic or exertional cp, hempoptysis, orthopnea pnd or change in chronic leg swelling. Also denies presyncope, palpitations, heartburn, abdominal pain, nausea, vomiting, diarrhea or change in bowel or urinary habits, dysuria,hematuria, rash, arthralgias, visual complaints, headache, numbness weakness or ataxia.     Objective:   Physical Exam Gen. Pleasant, obese, in no distress ENT - no lesions, no post nasal drip, poor dentition 1 mm overbite Neck: No JVD, no thyromegaly, no carotid bruits Lungs: no use of accessory muscles, no dullness to percussion, decreased without rales or rhonchi  Cardiovascular: Rhythm regular, heart sounds  normal, no murmurs or gallops, no peripheral edema Musculoskeletal: No deformities, no cyanosis or clubbing , no tremors        Assessment &  Plan:

## 2022-09-27 NOTE — Patient Instructions (Signed)
X Rx for new CPAP hose to [employer] schedule CPAP titration study  If you are not able to get comfortable on CPAP in spite of her mouth, then we will proceed with evaluation for implant for sleep apnea

## 2022-09-27 NOTE — Assessment & Plan Note (Signed)
She has moderate OSA and is struggled with CPAP.  I am unable to troubleshoot her problem whether this is mask fitting or pressure related.  We will try to get her new CPAP supplies including a new CPAP house. We will also set her up for a formal CPAP titration study to try to troubleshoot her problems and see if we can get her more comfortable mask or lower pressure. We will allow for 3.  To 6 months to achieve these objectives if we are unable then we may have to proceed with hypoglossal nerve stimulator implant I discussed this therapy with her and she would be willing to proceed if needed. Her dentition is very poor oral appliance would not be an option

## 2022-09-30 ENCOUNTER — Ambulatory Visit (INDEPENDENT_AMBULATORY_CARE_PROVIDER_SITE_OTHER): Payer: Medicare Other | Admitting: Psychiatry

## 2022-09-30 ENCOUNTER — Encounter (HOSPITAL_COMMUNITY): Payer: Self-pay | Admitting: Psychiatry

## 2022-09-30 DIAGNOSIS — F331 Major depressive disorder, recurrent, moderate: Secondary | ICD-10-CM

## 2022-09-30 MED ORDER — SERTRALINE HCL 100 MG PO TABS: ORAL_TABLET | ORAL | 0 refills | Status: DC

## 2022-09-30 NOTE — Progress Notes (Signed)
BH MD/PA/NP OP Progress Note  09/30/2022 2:33 PM BILLE SCHWERIN  MRN:  [id number]  Chief Complaint:  Chief Complaint  Patient presents with   Depression   Anxiety   Follow-up   HPI: This patient is a 72 year old married white female who lives with her husband in [place].  She has 2 grown sons.  She retired as a Magazine features editor.  The patient returns for follow-up with her husband for an in person visit.  She was last seen about 13 months ago.  Last time she and her mother-in-law were not getting along.  Her husband moved the mother-in-law onto her own apartment so things have been a lot better.  She denies any current symptoms of depression or anxiety.  She does not sleep well but is mostly due to the pain in her back and legs.  She has a vestibular schwannoma and sometimes has trouble hearing but most of the time she gets by okay.  She denies any thoughts of self-harm or suicide.  She asked if she can cut down the medication but her husband states when she cut the Zoloft from 150 mg to 100 mg daily she became angry and irritable.  I suggested that she stick with 150 mg dose and she reluctantly agreed Visit Diagnosis:    ICD-10-CM   1. Major depressive disorder, recurrent episode, moderate (HCC)  F33.1 sertraline (ZOLOFT) 100 MG tablet      Past Psychiatric History: Previous outpatient treatment  Past Medical History:  Past Medical History:  Diagnosis Date   Anxiety attack    Arthritis    Back pain, chronic    Chronic kidney disease    Crohn's colitis ([place])    Depression (emotion)    Dysrhythmia    GERD (gastroesophageal reflux disease)    Hypertension    Hypothyroid    Neuropathy    Sleep apnea    cpap is not working per patient - needs to find a new doctor - not used since 7/16 per pat    Stage 3 chronic kidney disease ([place])    Vertigo    followed by Dr Wardell Heath- in [place]     Past Surgical History:  Procedure Laterality Date   ABDOMINAL HYSTERECTOMY     BIOPSY   06/08/2018   Procedure: BIOPSY;  Surgeon: Rogene Houston, MD;  [facility];  Service: Endoscopy;;  (colon)   CHOLECYSTECTOMY     COLON SURGERY     for Crohn's in the 80s in [place]   COLONOSCOPY N/A 03/11/2013   Procedure: COLONOSCOPY;  Surgeon: Rogene Houston, MD;  [facility];  Service: Endoscopy;  Laterality: N/A;  1200   COLONOSCOPY N/A 06/08/2018   Procedure: COLONOSCOPY;  Surgeon: Rogene Houston, MD;  [facility];  Service: Endoscopy;  Laterality: N/A;  8:25   fatty tumor removed     HARDWARE REMOVAL Right 08/07/2016   Procedure: HARDWARE REMOVAL;  Surgeon: Gaynelle Arabian, MD;  Location: [facility];  Service: Orthopedics;  Laterality: Right;   Hip replacement      Rt hip in 2010 in [place] ARTHROSCOPY Left 03/29/2015   Procedure: ARTHROSCOPY LEFT KNEE WITH MENSICAL DEBRIDEMENT, chondroplasty;  Surgeon: Gaynelle Arabian, MD;  Location: [facility];  Service: Orthopedics;  Laterality: Left;   ORIF TIBIA PLATEAU Right 02/29/2016   Procedure: OPEN REDUCTION INTERNAL FIXATION (ORIF RIGHT  TIBIAL PLATEAU FRACTURE;  Surgeon: Gaynelle Arabian, MD;  Location: [facility];  Service: Orthopedics;  Laterality:  Right;   TEE WITHOUT CARDIOVERSION N/A 03/27/2021   Procedure: TRANSESOPHAGEAL ECHOCARDIOGRAM (TEE);  Surgeon: Arnoldo Lenis, MD;  [facility];  Service: Endoscopy;  Laterality: N/A;   TOTAL KNEE ARTHROPLASTY Right 08/25/2017   Procedure: TOTAL RIGHT KNEE ARTHROPLASTY;  Surgeon: Gaynelle Arabian, MD;  [facility];  Service: Orthopedics;  Laterality: Right;    Family Psychiatric History: See below  Family History:  Family History  Problem Relation Age of Onset   Crohn's disease Brother    Depression Mother    Anxiety disorder Mother    Depression Sister    Aneurysm Father    Colon cancer Neg Hx     Social History:  Social History   Socioeconomic History   Marital status: Married    Spouse name: Not on file   Number of children: Not  on file   Years of education: Not on file   Highest education level: Not on file  Occupational History   Not on file  Tobacco Use   Smoking status: Never   Smokeless tobacco: Never  Vaping Use   Vaping Use: Never used  Substance and Sexual Activity   Alcohol use: No    Alcohol/week: 0.0 standard drinks of alcohol   Drug use: No   Sexual activity: Not Currently  Other Topics Concern   Not on file  Social History Narrative   Not on file   Social Determinants of Health   Financial Resource Strain: Not on file  Food Insecurity: Not on file  Transportation Needs: Not on file  Physical Activity: Not on file  Stress: Not on file  Social Connections: Not on file    Allergies:  Allergies  Allergen Reactions   Morphine And Related Hives and Itching    May cause blood pressure to drop   Robaxin [Methocarbamol] Itching   Sulfa Antibiotics Itching    ALL SULFA DRUGS    Metabolic Disorder Labs: Lab Results  Component Value Date   HGBA1C 5.1 07/02/2022   No results found for: "PROLACTIN" Lab Results  Component Value Date   CHOL 162 07/02/2022   TRIG 110 07/02/2022   HDL 45 07/02/2022   CHOLHDL 3.6 07/02/2022   LDLCALC 97 07/02/2022   Lab Results  Component Value Date   TSH 1.910 07/02/2022    Therapeutic Level Labs: No results found for: "LITHIUM" No results found for: "VALPROATE" No results found for: "CBMZ"  Current Medications: Current Outpatient Medications  Medication Sig Dispense Refill   amLODipine (NORVASC) 10 MG tablet TAKE 1 TABLET BY MOUTH ONCE DAILY **THIS  IS  A  DOSE  INCREASE** 90 tablet 1   dicyclomine (BENTYL) 10 MG capsule Take 1 capsule (10 mg total) by mouth 2 (two) times daily before a meal. 60 capsule 5   fluticasone (FLONASE) 50 MCG/ACT nasal spray Place 1 spray into the nose daily as needed for allergies or rhinitis.     hydrALAZINE (APRESOLINE) 50 MG tablet Take 50 mg by mouth 2 (two) times daily.     HYDROcodone-acetaminophen  (NORCO/VICODIN) 5-325 MG tablet Take 1 tablet by mouth at bedtime.     levothyroxine (SYNTHROID) 100 MCG tablet Take 100 mcg by mouth daily.     loperamide (IMODIUM) 2 MG capsule TAKE 1 CAPSULE BY MOUTH TWICE DAILY AS NEEDED FOR LOOSE STOOLS 180 capsule 1   metoprolol succinate (TOPROL-XL) 25 MG 24 hr tablet Take 1.5 tablets (37.5 mg total) by mouth daily. 45 tablet 6   Multiple Vitamin (MULTIVITAMIN  WITH MINERALS) TABS tablet Take 1 tablet by mouth daily.     pantoprazole (PROTONIX) 40 MG tablet Take 1 tablet (40 mg total) by mouth daily before breakfast. (Patient taking differently: Take 40 mg by mouth daily as needed (acid reflux).) 90 tablet 3   propranolol (INDERAL) 20 MG tablet Take 1 tablet (20 mg total) by mouth 3 (three) times daily as needed. 90 tablet 11   rOPINIRole (REQUIP) 0.5 MG tablet Take 0.5 mg by mouth in the morning and at bedtime.     rosuvastatin (CRESTOR) 5 MG tablet Take 5 mg by mouth daily.     spironolactone (ALDACTONE) 50 MG tablet Take 50 mg by mouth daily.     potassium chloride (KLOR-CON) 10 MEQ tablet Take 10 mEq by mouth 2 (two) times daily.     sertraline (ZOLOFT) 100 MG tablet TAKE 1 & 1/2 (ONE & ONE-HALF) TABLETS BY MOUTH ONCE DAILY 135 tablet 0   No current facility-administered medications for this visit.     Musculoskeletal: Strength & Muscle Tone: within normal limits Gait & Station: normal Patient leans: N/A  Psychiatric Specialty Exam: Review of Systems  HENT:  Positive for hearing loss.   Musculoskeletal:  Positive for arthralgias and back pain.  Psychiatric/Behavioral:  Positive for sleep disturbance.   All other systems reviewed and are negative.   Blood pressure 134/80, pulse (!) 108, height 5\' 2"  (1.575 m), weight 162 lb 6.4 oz (73.7 kg), SpO2 100 %.Body mass index is 29.7 kg/m.  General Appearance: Casual and Fairly Groomed  Eye Contact:  Good  Speech:  Clear and Coherent  Volume:  Normal  Mood:  Euthymic  Affect:  Congruent  Thought  Process:  Goal Directed  Orientation:  Full (Time, Place, and Person)  Thought Content: WDL   Suicidal Thoughts:  No  Homicidal Thoughts:  No  Memory:  Immediate;   Good Recent;   Good Remote;   NA  Judgement:  Good  Insight:  Fair  Psychomotor Activity:  Normal  Concentration:  Concentration: Good and Attention Span: Good  Recall:  [employer] of Knowledge: Fair  Language: Good  Akathisia:  No  Handed:  Right  AIMS (if indicated): not done  Assets:  Communication Skills Desire for Improvement Resilience Social Support  ADL's:  Intact  Cognition: WNL  Sleep:  Fair   Screenings: GAD-7    [place] Office Visit from 09/30/2022 in [facility] at [place] Visit from 08/23/2021 in [facility] at [facility]  Total GAD-7 Score 4 14      PHQ2-9    [place] Office Visit from 09/30/2022 in [facility] at [place] Visit from 08/23/2021 in [facility] at [place] Video Visit from 12/22/2020 in [place] at [facility] Total Score 0 1 2  PHQ-9 Total Score 3 6 6       [place] Office Visit from 08/23/2021 in [place] at [place] Admission (Discharged) from 03/27/2021 in [facility] from 03/22/2021 in [facility] No Risk No Risk No Risk        Assessment and Plan: This patient is a 72 year old female with a history of depression and anxiety.  She continues to do well on Zoloft 150 mg daily.  She is no longer taking the BuSpar or trazodone.  She will return to see me  in 6 months  Collaboration of Care: Collaboration of Care: Primary Care Provider AEB notes will be shared with PCP at patient's request  Patient/Guardian was advised Release of Information must be obtained prior to any record release  in order to collaborate their care with an outside provider. Patient/Guardian was advised if they have not already done so to contact the registration department to sign all necessary forms in order for [place] to release information regarding their care.   Consent: Patient/Guardian gives verbal consent for treatment and assignment of benefits for services provided during this visit. Patient/Guardian expressed understanding and agreed to proceed.    [name], MD 09/30/2022, 2:33 PM

## 2022-10-03 DIAGNOSIS — M5417 Radiculopathy, lumbosacral region: Secondary | ICD-10-CM | POA: Diagnosis not present

## 2022-10-03 DIAGNOSIS — G2581 Restless legs syndrome: Secondary | ICD-10-CM | POA: Diagnosis not present

## 2022-10-03 DIAGNOSIS — M542 Cervicalgia: Secondary | ICD-10-CM | POA: Diagnosis not present

## 2022-10-05 DIAGNOSIS — I10 Essential (primary) hypertension: Secondary | ICD-10-CM | POA: Diagnosis not present

## 2022-10-15 ENCOUNTER — Other Ambulatory Visit: Payer: Self-pay | Admitting: Cardiology

## 2022-10-23 ENCOUNTER — Ambulatory Visit (HOSPITAL_BASED_OUTPATIENT_CLINIC_OR_DEPARTMENT_OTHER): Payer: Medicare Other | Attending: Pulmonary Disease | Admitting: Pulmonary Disease

## 2022-10-23 DIAGNOSIS — G4733 Obstructive sleep apnea (adult) (pediatric): Secondary | ICD-10-CM | POA: Diagnosis not present

## 2022-10-29 DIAGNOSIS — G4733 Obstructive sleep apnea (adult) (pediatric): Secondary | ICD-10-CM | POA: Diagnosis not present

## 2022-10-29 NOTE — Procedures (Signed)
Patient Name: Jessica [name], Priestly Date: 10/23/2022 Gender: Female D.O.B: 02-16-1951 Age (years): 10 Referring Provider: Cyril Mourning MD, ABSM Height (inches): 62 Interpreting Physician: Cyril Mourning MD, ABSM Weight (lbs): 165 RPSGT: Ulyess Mort BMI: 30 MRN: [id number] Neck Size: 14.00 <br> <br> CLINICAL INFORMATION The patient is referred for a BiPAP titration to treat sleep apnea.  01/2021 HST showed mod OSA with AHI 28/ hr  SLEEP STUDY TECHNIQUE As per the AASM Manual for the Scoring of Sleep and Associated Events v2.3 (April 2016) with a hypopnea requiring 4% desaturations.  The channels recorded and monitored were frontal, central and occipital EEG, electrooculogram (EOG), submentalis EMG (chin), nasal and oral airflow, thoracic and abdominal wall motion, anterior tibialis EMG, snore microphone, electrocardiogram, and pulse oximetry. Bilevel positive airway pressure (BPAP) was initiated at the beginning of the study and titrated to treat sleep-disordered breathing.  MEDICATIONS Medications self-administered by patient taken the night of the study : ASPIRIN, HYDROCODONE W/ACETAMINOPHEN, NASACORT, REQUIP  RESPIRATORY PARAMETERS Optimal IPAP Pressure (cm): 25 AHI at Optimal Pressure (/hr) 0 Optimal EPAP Pressure (cm): 21   Overall Minimal O2 (%): 90.0 Minimal O2 at Optimal Pressure (%): 95.0 SLEEP ARCHITECTURE Start Time: 10:06:56 PM Stop Time: 4:49:31 AM Total Time (min): 402.6 Total Sleep Time (min): 292 Sleep Latency (min): 6.9 Sleep Efficiency (%): 72.5% REM Latency (min): 239.5 WASO (min): 103.7 Stage N1 (%): 10.4% Stage N2 (%): 79.3% Stage N3 (%): 0.0% Stage R (%): 10.3 Supine (%): 83.21 Arousal Index (/hr): 71.7     CARDIAC DATA The 2 lead EKG demonstrated sinus rhythm. The mean heart rate was 65.1 beats per minute. Other EKG findings include: PVCs. LEG MOVEMENT DATA The total Periodic Limb Movements of Sleep (PLMS) were 0. The PLMS index was 0.0. A PLMS index of <15  is considered normal in adults.  IMPRESSIONS - An optimal PAP pressure was selected for this patient ( 25 /21 cm of water) - Central sleep apnea was not noted during this titration (CAI = 4.3/h). - Significant oxygen desaturations were not observed during this titration (min O2 = 90.0%). - The patient snored with moderate snoring volume. - 2-lead EKG demonstrated: PVCs - Clinically significant periodic limb movements were not noted during this study. Arousals associated with PLMs were rare.   DIAGNOSIS - Obstructive Sleep Apnea (G47.33)   RECOMMENDATIONS - Trial of BiPAP therapy on 25/21 cm H2O with a X-Small size Resmed Full Face AirFit F10 for Her mask and heated humidification. Alternatively, trial auto bilevel with IPAP max 25, EPAP min 10 & PS +4 cm - Avoid alcohol, sedatives and other CNS depressants that may worsen sleep apnea and disrupt normal sleep architecture. - Sleep hygiene should be reviewed to assess factors that may improve sleep quality. - Weight management and regular exercise should be initiated or continued. - Return to Sleep Center for re-evaluation after 4 weeks of therapy   Cyril Mourning MD Board Certified in Sleep medicine

## 2022-10-31 DIAGNOSIS — M5417 Radiculopathy, lumbosacral region: Secondary | ICD-10-CM | POA: Diagnosis not present

## 2022-10-31 DIAGNOSIS — G603 Idiopathic progressive neuropathy: Secondary | ICD-10-CM | POA: Diagnosis not present

## 2022-10-31 DIAGNOSIS — M5412 Radiculopathy, cervical region: Secondary | ICD-10-CM | POA: Diagnosis not present

## 2022-11-05 DIAGNOSIS — I10 Essential (primary) hypertension: Secondary | ICD-10-CM | POA: Diagnosis not present

## 2022-11-12 DIAGNOSIS — Z1231 Encounter for screening mammogram for malignant neoplasm of breast: Secondary | ICD-10-CM | POA: Diagnosis not present

## 2022-11-14 ENCOUNTER — Ambulatory Visit (INDEPENDENT_AMBULATORY_CARE_PROVIDER_SITE_OTHER): Payer: Medicare Other | Admitting: Podiatry

## 2022-11-14 ENCOUNTER — Ambulatory Visit (INDEPENDENT_AMBULATORY_CARE_PROVIDER_SITE_OTHER): Payer: Medicare Other

## 2022-11-14 DIAGNOSIS — M778 Other enthesopathies, not elsewhere classified: Secondary | ICD-10-CM

## 2022-11-14 DIAGNOSIS — M19071 Primary osteoarthritis, right ankle and foot: Secondary | ICD-10-CM

## 2022-11-14 DIAGNOSIS — M19072 Primary osteoarthritis, left ankle and foot: Secondary | ICD-10-CM | POA: Diagnosis not present

## 2022-11-14 DIAGNOSIS — M19079 Primary osteoarthritis, unspecified ankle and foot: Secondary | ICD-10-CM

## 2022-11-14 MED ORDER — GABAPENTIN 300 MG PO CAPS: 300.0000 mg | ORAL_CAPSULE | Freq: Three times a day (TID) | ORAL | 2 refills | Status: DC

## 2022-11-14 NOTE — Progress Notes (Signed)
She presents today bilateral foot pain states it hurts right here she points to the sinus tarsi bilaterally she is also complaining of itching in her right arch and pain across the dorsal and plantar aspect each arch.  Objective: Vital signs are stable alert oriented x 3.  Severe pain on palpation sinus tarsi and end range of motion of the subtalar joint.  She has consistent pain on palpation of the tarsometatarsal joints bilaterally.  Radiographs taken today demonstrate osseously mature individual with severe demineralization of the bones bilaterally.  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  Ankle joints appear to be normal.  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  Plan: Starting her on gabapentin 300 mg 3 times a day.  I also injected the subtalar joint today to the sinus tarsi after sterile Betadine skin prep total of 20 mg was injected to each foot.  Tolerated procedure well without complications we will follow-up with her on an as-needed basis or in 1 month for med check.

## 2022-11-18 ENCOUNTER — Other Ambulatory Visit: Payer: Self-pay

## 2022-11-18 ENCOUNTER — Telehealth: Payer: Self-pay | Admitting: Pulmonary Disease

## 2022-11-18 DIAGNOSIS — G4733 Obstructive sleep apnea (adult) (pediatric): Secondary | ICD-10-CM

## 2022-11-18 NOTE — Telephone Encounter (Signed)
ATC pt x 2, left detailed VM (ok per DPR) w/ results per RA. NFN att.

## 2022-11-18 NOTE — Telephone Encounter (Signed)
PT ret a call she assumes is for test results. Pls call @ [phone]

## 2022-12-04 ENCOUNTER — Telehealth: Payer: Self-pay | Admitting: Cardiology

## 2022-12-04 ENCOUNTER — Other Ambulatory Visit: Payer: Self-pay | Admitting: Cardiology

## 2022-12-04 DIAGNOSIS — Z299 Encounter for prophylactic measures, unspecified: Secondary | ICD-10-CM | POA: Diagnosis not present

## 2022-12-04 DIAGNOSIS — R5383 Other fatigue: Secondary | ICD-10-CM | POA: Diagnosis not present

## 2022-12-04 DIAGNOSIS — I10 Essential (primary) hypertension: Secondary | ICD-10-CM | POA: Diagnosis not present

## 2022-12-04 DIAGNOSIS — D509 Iron deficiency anemia, unspecified: Secondary | ICD-10-CM | POA: Diagnosis not present

## 2022-12-04 NOTE — Telephone Encounter (Signed)
Pt c/o Shortness Of Breath: STAT if SOB developed within the last 24 hours or pt is noticeably SOB on the phone  1. Are you currently SOB (can you hear that pt is SOB on the phone)? no  2. How long have you been experiencing SOB? For about 2-3 months   3. Are you SOB when sitting or when up moving around? Patient states both.  4. Are you currently experiencing any other symptoms? She states she is very fatigued, and she is sleeping a lot.

## 2022-12-06 ENCOUNTER — Other Ambulatory Visit: Payer: Self-pay | Admitting: Podiatry

## 2022-12-06 DIAGNOSIS — M778 Other enthesopathies, not elsewhere classified: Secondary | ICD-10-CM

## 2022-12-06 DIAGNOSIS — M19079 Primary osteoarthritis, unspecified ankle and foot: Secondary | ICD-10-CM

## 2022-12-06 DIAGNOSIS — I10 Essential (primary) hypertension: Secondary | ICD-10-CM | POA: Diagnosis not present

## 2022-12-10 NOTE — Telephone Encounter (Signed)
Multiple attempts to reach patient with no success.

## 2022-12-12 ENCOUNTER — Ambulatory Visit (INDEPENDENT_AMBULATORY_CARE_PROVIDER_SITE_OTHER): Payer: Medicare Other | Admitting: Podiatry

## 2022-12-12 ENCOUNTER — Encounter: Payer: Self-pay | Admitting: Podiatry

## 2022-12-12 DIAGNOSIS — M79676 Pain in unspecified toe(s): Secondary | ICD-10-CM | POA: Diagnosis not present

## 2022-12-12 DIAGNOSIS — B351 Tinea unguium: Secondary | ICD-10-CM

## 2022-12-12 NOTE — Progress Notes (Signed)
She presents today for follow-up of her gabapentin and states that her feet may be feeling a little bit better but she is only taking it 300 mg twice a day as opposed to 3 times a day.  She states that she is making me sleepy.  She also complaining of pain to the right hallux nail.  Objective: Vital signs are stable oriented x 3 no erythema Dem salines drainage or odor hallux nail plate right is thick and dystrophic.  No signs of bacterial infection.  No change in physical exam as far as neuropathy goes still exquisitely tender on palpation of the feet hyper sensate.  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  She also has nail dystrophy hallux right.  Plan: Debrided toenail hallux right for her today.  Discussed injecting her subtalar joints once again the next time she comes in in 1 month.  I did recommend that she try 300 mg 3 times a day.  If she is not able to take it at lunch without getting too sleepy for the afternoon that she will double up at nighttime.  Hopefully that will give [place] good information as to whether or not an increase will help her with her neuropathy.

## 2022-12-18 DIAGNOSIS — M810 Age-related osteoporosis without current pathological fracture: Secondary | ICD-10-CM | POA: Diagnosis not present

## 2022-12-18 DIAGNOSIS — E7800 Pure hypercholesterolemia, unspecified: Secondary | ICD-10-CM | POA: Diagnosis not present

## 2022-12-18 DIAGNOSIS — E039 Hypothyroidism, unspecified: Secondary | ICD-10-CM | POA: Diagnosis not present

## 2022-12-18 DIAGNOSIS — Z1339 Encounter for screening examination for other mental health and behavioral disorders: Secondary | ICD-10-CM | POA: Diagnosis not present

## 2022-12-18 DIAGNOSIS — Z299 Encounter for prophylactic measures, unspecified: Secondary | ICD-10-CM | POA: Diagnosis not present

## 2022-12-18 DIAGNOSIS — Z0000 Encounter for general adult medical examination without abnormal findings: Secondary | ICD-10-CM | POA: Diagnosis not present

## 2022-12-18 DIAGNOSIS — F331 Major depressive disorder, recurrent, moderate: Secondary | ICD-10-CM | POA: Diagnosis not present

## 2022-12-18 DIAGNOSIS — R5383 Other fatigue: Secondary | ICD-10-CM | POA: Diagnosis not present

## 2022-12-18 DIAGNOSIS — I10 Essential (primary) hypertension: Secondary | ICD-10-CM | POA: Diagnosis not present

## 2022-12-18 DIAGNOSIS — Z79899 Other long term (current) drug therapy: Secondary | ICD-10-CM | POA: Diagnosis not present

## 2022-12-18 DIAGNOSIS — Z7189 Other specified counseling: Secondary | ICD-10-CM | POA: Diagnosis not present

## 2022-12-18 DIAGNOSIS — Z1331 Encounter for screening for depression: Secondary | ICD-10-CM | POA: Diagnosis not present

## 2022-12-19 DIAGNOSIS — G2581 Restless legs syndrome: Secondary | ICD-10-CM | POA: Diagnosis not present

## 2022-12-19 DIAGNOSIS — M7912 Myalgia of auxiliary muscles, head and neck: Secondary | ICD-10-CM | POA: Diagnosis not present

## 2022-12-19 DIAGNOSIS — M7918 Myalgia, other site: Secondary | ICD-10-CM | POA: Diagnosis not present

## 2022-12-19 DIAGNOSIS — G894 Chronic pain syndrome: Secondary | ICD-10-CM | POA: Diagnosis not present

## 2022-12-24 DIAGNOSIS — N1832 Chronic kidney disease, stage 3b: Secondary | ICD-10-CM | POA: Diagnosis not present

## 2022-12-24 DIAGNOSIS — I10 Essential (primary) hypertension: Secondary | ICD-10-CM | POA: Diagnosis not present

## 2022-12-24 DIAGNOSIS — K5010 Crohn's disease of large intestine without complications: Secondary | ICD-10-CM | POA: Diagnosis not present

## 2022-12-24 DIAGNOSIS — D649 Anemia, unspecified: Secondary | ICD-10-CM | POA: Diagnosis not present

## 2022-12-24 DIAGNOSIS — Z299 Encounter for prophylactic measures, unspecified: Secondary | ICD-10-CM | POA: Diagnosis not present

## 2022-12-24 DIAGNOSIS — G319 Degenerative disease of nervous system, unspecified: Secondary | ICD-10-CM | POA: Diagnosis not present

## 2022-12-25 NOTE — Progress Notes (Addendum)
GI Office Note    Referring Provider: Ignatius Specking, MD Primary Care Physician:  Ignatius Specking, MD Primary Gastroenterologist: Dolores Frame, MD (previously Dr. Karilyn Cota)  Date:  12/26/2022  ID:  Jessica [name], DOB 05/23/1951, MRN [id number]   Chief Complaint   Chief Complaint  Patient presents with   Follow-up    Follow up on crohns. Pt states her PCP states hemoglobin is low   History of Present Illness  Jessica [name] is a 72 y.o. 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  Colonoscopy 06/08/2018: - The examined portion of the ileum was normal.  - Mucosal ulceration. Biopsied.  - Colonic fistula.  - Internal hemorrhoids. -pathology: small reactive changes but no active disease -Repeat colonoscopy in 5 years.  -Patient requested to not go any therapy unless she relapses.  Last office visit 01/30/21.  Doing well.  Heartburn controlled pantoprazole.  Willing to drop her dose in the future due to risk of osteopenia.  Unsure when due for next bone density.  Denied any dysphagia, hoarseness, sore throat, chronic cough.  Has occasional diarrhea and accidents but mostly she has 2 formed stools.  Stop dicyclomine.  Denies any melena or bright BPR.  Appetite good.  Lost 4 pounds since her last visit with Ortho trigones more prior to knee surgery.   Labs 12/04/22: Iron normal (103), Hgb 11.4, Ferritin 83 Labs 12/18/22: Hgb 10.5, MCV 98, plts 159, Cr 1.3, normal LFT. TSH wnl. Vit D 34.9  Today:  Has some incontinence of urine and feces. States she needs her colonoscopy soon as well. Her PCP recommended given her anemia. Is taking an iron supplement daily.   Denies melena or brbpr. States stool are normally on the looser side and has up to 5-6 per day and usually a  moderate amount. Used to take imodium once daily and bentyl 1-2 times daily. Has been out of this for a while. When she takes the iron she gets a little constipated and has to strain.   Denies chest pain. Does get shortness of breath at times (has leaky valve and heart murmur). Is going to reach out to Dr. Wyline Mood. Will also be seeing Dr. Wolfgang Phoenix for her kidneys. Does have fatigue.   Has had some dysphagia with foods being stuck in her mid chest and had been going on for many months and is occurring more often. Eating bread is getting stuck commonly and has a hard time getting it to go down. Usually takes protonix but has been out of it. Has heartburn symptoms even on the pantoprazole once daily.   No side effects from dicyclomine.   Using CPAP at night - having issues with it at night and getting the airflow she needs. Keeps having them adjust it.   Had an EGD years ago in [place].   EIM Joint - none Skin - easy skin tearing Eye - none  Last flu shot: 04/12/22 Last pneumonia shot:has had it Last zoster vaccine: Oct 2020 Last DEXA scan: NM bone scan May 2022. - getting ready to get osteoporosis shot COVID-19 shot: has had all of them (Moderna in 2021/2022)   Current Outpatient Medications  Medication Sig Dispense Refill   amLODipine (NORVASC) 10 MG tablet Take 1 tablet by mouth once daily 90 tablet 1  dicyclomine (BENTYL) 10 MG capsule Take 1 capsule (10 mg total) by mouth 2 (two) times daily before a meal. 60 capsule 5   fluticasone (FLONASE) 50 MCG/ACT nasal spray Place 1 spray into the nose daily as needed for allergies or rhinitis.     gabapentin (NEURONTIN) 300 MG capsule Take 1 capsule (300 mg total) by mouth 3 (three) times daily. 90 capsule 2   hydrALAZINE (APRESOLINE) 50 MG tablet Take 50 mg by mouth 2 (two) times daily.     HYDROcodone-acetaminophen (NORCO/VICODIN) 5-325 MG tablet Take 1 tablet by mouth at bedtime.     levothyroxine (SYNTHROID) 100 MCG tablet Take 100 mcg by  mouth daily.     methocarbamol (ROBAXIN) 500 MG tablet TAKE 1 TABLET BY MOUTH EVERY DAY AT BEDTIME AS NEEDED     metoprolol succinate (TOPROL-XL) 25 MG 24 hr tablet TAKE 1 & 1/2 (ONE & ONE-HALF) TABLETS BY MOUTH ONCE DAILY 45 tablet 3   Multiple Vitamin (MULTIVITAMIN WITH MINERALS) TABS tablet Take 1 tablet by mouth daily.     propranolol (INDERAL) 20 MG tablet Take 1 tablet (20 mg total) by mouth 3 (three) times daily as needed. 90 tablet 11   rOPINIRole (REQUIP) 0.5 MG tablet Take 0.5 mg by mouth in the morning and at bedtime.     rosuvastatin (CRESTOR) 5 MG tablet Take 5 mg by mouth daily.     sertraline (ZOLOFT) 100 MG tablet TAKE 1 & 1/2 (ONE & ONE-HALF) TABLETS BY MOUTH ONCE DAILY 135 tablet 0   loperamide (IMODIUM) 2 MG capsule TAKE 1 CAPSULE BY MOUTH TWICE DAILY AS NEEDED FOR LOOSE STOOLS (Patient not taking: Reported on 12/26/2022) 180 capsule 1   pantoprazole (PROTONIX) 40 MG tablet Take 1 tablet (40 mg total) by mouth daily before breakfast. (Patient not taking: Reported on 12/26/2022) 90 tablet 3   potassium chloride (KLOR-CON) 10 MEQ tablet Take 10 mEq by mouth 2 (two) times daily.     spironolactone (ALDACTONE) 50 MG tablet Take 50 mg by mouth daily. (Patient not taking: Reported on 12/26/2022)     tiZANidine (ZANAFLEX) 4 MG tablet  (Patient not taking: Reported on 12/26/2022)     No current facility-administered medications for this visit.    Past Medical History:  Diagnosis Date   Anxiety attack    Arthritis    Back pain, chronic    Chronic kidney disease    Crohn's colitis (HCC)    Depression (emotion)    Dysrhythmia    GERD (gastroesophageal reflux disease)    Hypertension    Hypothyroid    Neuropathy    Sleep apnea    cpap is not working per patient - needs to find a new doctor - not used since 7/16 per pat    Stage 3 chronic kidney disease (HCC)    Vertigo    followed by Dr Angelena Sole- in [place]     Past Surgical History:  Procedure Laterality Date   ABDOMINAL  HYSTERECTOMY     BIOPSY  06/08/2018   Procedure: BIOPSY;  Surgeon: Malissa Hippo, MD;  [facility];  Service: Endoscopy;;  (colon)   CHOLECYSTECTOMY     COLON SURGERY     for Crohn's in the 80s in [place]   COLONOSCOPY N/A 03/11/2013   Procedure: COLONOSCOPY;  Surgeon: Malissa Hippo, MD;  [facility]: [facility];  Service: Endoscopy;  Laterality: N/A;  1200   COLONOSCOPY N/A 06/08/2018   Procedure: COLONOSCOPY;  Surgeon: Malissa Hippo, MD;  [facility];  Service: Endoscopy;  Laterality: N/A;  8:25   fatty tumor removed     HARDWARE REMOVAL Right 08/07/2016   Procedure: HARDWARE REMOVAL;  Surgeon: Ollen Gross, MD;  Location: [facility];  Service: Orthopedics;  Laterality: Right;   Hip replacement      Rt hip in 2010 in [place]   KNEE ARTHROSCOPY Left 03/29/2015   Procedure: ARTHROSCOPY LEFT KNEE WITH MENSICAL DEBRIDEMENT, chondroplasty;  Surgeon: Ollen Gross, MD;  Location: [facility];  Service: Orthopedics;  Laterality: Left;   ORIF TIBIA PLATEAU Right 02/29/2016   Procedure: OPEN REDUCTION INTERNAL FIXATION (ORIF RIGHT  TIBIAL PLATEAU FRACTURE;  Surgeon: Ollen Gross, MD;  Location[facility];  Service: Orthopedics;  Laterality: Right;   TEE WITHOUT CARDIOVERSION N/A 03/27/2021   Procedure: TRANSESOPHAGEAL ECHOCARDIOGRAM (TEE);  Surgeon: Antoine Poche, MD;  [facility];  Service: Endoscopy;  Laterality: N/A;   TOTAL KNEE ARTHROPLASTY Right 08/25/2017   Procedure: TOTAL RIGHT KNEE ARTHROPLASTY;  Surgeon: Ollen Gross, MD;  [facility];  Service: Orthopedics;  Laterality: Right;    Family History  Problem Relation Age of Onset   Crohn's disease Brother    Depression Mother    Anxiety disorder Mother    Depression Sister    Aneurysm Father    Colon cancer Neg Hx     Allergies as of 12/26/2022 - Review Complete 12/26/2022  Allergen Reaction Noted   Morphine and codeine Hives and Itching 02/10/2013   Robaxin [methocarbamol] Itching  07/29/2016   Sulfa antibiotics Itching 07/29/2016    Social History   Socioeconomic History   Marital status: Married    Spouse name: Not on file   Number of children: Not on file   Years of education: Not on file   Highest education level: Not on file  Occupational History   Not on file  Tobacco Use   Smoking status: Never   Smokeless tobacco: Never  Vaping Use   Vaping Use: Never used  Substance and Sexual Activity   Alcohol use: No    Alcohol/week: 0.0 standard drinks of alcohol   Drug use: No   Sexual activity: Not Currently  Other Topics Concern   Not on file  Social History Narrative   Not on file   Social Determinants of Health   Financial Resource Strain: Not on file  Food Insecurity: Not on file  Transportation Needs: Not on file  Physical Activity: Not on file  Stress: Not on file  Social Connections: Not on file     Review of Systems   Gen: Denies fever, chills, anorexia. Denies fatigue, weakness, weight loss.  CV: Denies chest pain, palpitations, syncope, peripheral edema, and claudication. Resp: Denies dyspnea at rest, cough, wheezing, coughing up blood, and pleurisy. GI: See HPI Derm: Denies rash, itching, dry skin Psych: Denies depression, anxiety, memory loss, confusion. No homicidal or suicidal ideation.  Heme: Denies bruising, bleeding, and enlarged lymph nodes.   Physical Exam   BP (!) 104/57   Pulse 71   Temp (!) 97.5 F (36.4 C)   Ht 5\' 2"  (1.575 m)   Wt 166 lb 12.8 oz (75.7 kg)   BMI 30.51 kg/m   General:   Alert and oriented. No distress noted. Pleasant and cooperative.  Head:  Normocephalic and atraumatic. Eyes:  Conjuctiva clear without scleral icterus. Mouth:  Oral mucosa pink and moist. Good dentition. No lesions. Lungs:  Clear to auscultation bilaterally. No wheezes, rales, or rhonchi. No distress.  Heart:  S1, S2 present without murmurs appreciated.  Abdomen:  +BS, soft,  non-distended.  Mild TTP to epigastrium.  No  rebound or guarding. No HSM or masses noted. Rectal: deferred Msk:  Symmetrical without gross deformities. Normal posture. Extremities:  Without edema. Neurologic:  Alert and  oriented x4 Psych:  Alert and cooperative. Normal mood and affect.   Assessment  Jessica [name] is a 72 y.o. 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  Chron's: History of small bowel Chron's with history of right hemicolectomy and is not maintained on any medical therapy.  Last colonoscopy in 2019 penalize fistula anastomosis but not felt to be clinically significant.  Biopsies were benign.  Has history of chronic diarrhea which could also be secondary to IBS.  Previously maintained well on Bentyl and Imodium as needed which she has been out of for quite some time.  Currently having 5-6 bowel movements daily.  As noted below she has developed some recurrent anemia.  Given she is due for surveillance colonoscopy in December of this year we will perform early interval colonoscopy in the setting of anemia.  Will resume her prior medications including Bentyl and Imodium.  Recent thyroid function normal.  Has a good appetite, denies any weight loss, melena, or BRBPR.  Anemia: Hemoglobin normal at 11.4 in May but with decrease in hemoglobin to 10.5 on 6/12 with normocytic indices.  Anemia is likely multifactorial in setting of CKD and history of Crohn's.  Has been on daily iron therapy.  Iron studies in May with normal iron level and ferritin.  History of remote EGD, report unavailable.  She does have some fatigue and shortness of breath, likely related more to cardiac issues but denies any chest pain.  Will perform early interval colonoscopy will also perform upper endoscopy for  evaluation of anemia as well as her reflux and dysphagia as noted below.  As stated below we will increase PPI to twice daily for patient's reflux.  Given her history of heart murmur and leaky valve we will ask for preoperative cardiac clearance from Dr. Wyline Mood.  GERD, dysphagia: Continues to have some reflux symptoms despite once daily PPI, is currently need a refill.  Will increase to twice daily in hopes to have better control of her symptoms.  Has been experiencing worsening dysphagia over the last several months mostly with solids getting stuck mid sternal region.  Denies any nausea, vomiting.  Patient would like to pursue upper endoscopy with consideration for possible dilation to rule out esophagitis, gastritis, duodenitis, ulcer disease, esophageal ring, esophageal web, or stricture/stenosis.   PLAN   Proceed with upper endoscopy with possible dilation and colonoscopy with propofol by Dr. Levon Hedger in near future: the risks, benefits, and alternatives have been discussed with the patient in detail. The patient states understanding and desires to proceed. ASA 3 Hold iron for 7 days Hold dicyclomine for 5 days Cardiac clearance - pre-op Continue iron therapy Continue PPI, increase to BID Continue bentyl as needed Continue imodium as needed.  Recheck CBC in 4 week.  Follow up 3 months      Brooke Bonito, MSN, FNP-BC, AGACNP-BC [facility]  I have reviewed the note and agree with the APP's assessment as described in this progress note  Patient with history  of Crohn's disease complicated by Need to undergo right hemicolectomy.  Unclear why she had a hemicolectomy performed and if the Crohn's was located only in the small bowel or in the colon.  Last colonoscopy showed presence of some inflammation in the anastomosis and possible fistula but she has been off medication for quite some time.  Currently having diarrhea.  Unclear if this was related to bowel  resection.  Colonoscopy will help [place] determine if there is recurrence of the her disease.  If if normal, I will recommend undergoing cross-sectional abdominal imaging to determine the extension of her disease in other parts of the small bowel.  I am concerned her disease has recurred as she is now presenting anemia. Katrinka Blazing, MD Gastroenterology and Hepatology [facility]

## 2022-12-26 ENCOUNTER — Encounter: Payer: Self-pay | Admitting: Gastroenterology

## 2022-12-26 ENCOUNTER — Telehealth: Payer: Self-pay | Admitting: *Deleted

## 2022-12-26 ENCOUNTER — Ambulatory Visit (INDEPENDENT_AMBULATORY_CARE_PROVIDER_SITE_OTHER): Payer: Medicare Other | Admitting: Gastroenterology

## 2022-12-26 VITALS — BP 104/57 | HR 71 | Temp 97.5°F | Ht 62.0 in | Wt 166.8 lb

## 2022-12-26 DIAGNOSIS — K5000 Crohn's disease of small intestine without complications: Secondary | ICD-10-CM | POA: Diagnosis not present

## 2022-12-26 DIAGNOSIS — K529 Noninfective gastroenteritis and colitis, unspecified: Secondary | ICD-10-CM | POA: Diagnosis not present

## 2022-12-26 DIAGNOSIS — R1310 Dysphagia, unspecified: Secondary | ICD-10-CM

## 2022-12-26 DIAGNOSIS — K219 Gastro-esophageal reflux disease without esophagitis: Secondary | ICD-10-CM

## 2022-12-26 MED ORDER — DICYCLOMINE HCL 10 MG PO CAPS: 10.0000 mg | ORAL_CAPSULE | Freq: Two times a day (BID) | ORAL | 5 refills | Status: DC

## 2022-12-26 MED ORDER — LOPERAMIDE HCL 2 MG PO CAPS: ORAL_CAPSULE | ORAL | 1 refills | Status: DC

## 2022-12-26 MED ORDER — PANTOPRAZOLE SODIUM 40 MG PO TBEC: 40.0000 mg | DELAYED_RELEASE_TABLET | Freq: Every day | ORAL | 3 refills | Status: DC

## 2022-12-26 NOTE — Telephone Encounter (Signed)
   Name: Jessica [name]  DOB: [birth year]  MRN: [id number]  Primary Cardiologist: Dina Rich, MD   Preoperative team, please contact this patient and set up a phone call appointment for further preoperative risk assessment. Please obtain consent and complete medication review. Thank you for your help.  I confirm that guidance regarding antiplatelet and oral anticoagulation therapy has been completed and, if necessary, noted below.  None requested.   Ronney Asters, NP 12/26/2022, 4:15 PM [facility]

## 2022-12-26 NOTE — Patient Instructions (Addendum)
We are scheduling for an upper endoscopy with possible dilation and colonoscopy in the near future with Dr. Levon Hedger.  Continue taking your daily iron therapy.    Please increase your pantoprazole to 40 mg twice daily, 30 minutes prior to breakfast and dinner.  I have refilled your Imodium as well as your Bentyl.  You can take these as needed.  Start with half tablet of Imodium as needed.  You can use Bentyl up to twice daily.  Monitor for blurry vision, dizziness, headaches, and constipation.  I would like to recheck your hemoglobin again in 1 month.  It was a pleasure to see you today. I want to create trusting relationships with patients. If you receive a survey regarding your visit,  I greatly appreciate you taking time to fill this out on paper or through your MyChart. I value your feedback.  Brooke Bonito, MSN, FNP-BC, AGACNP-BC [facility]

## 2022-12-26 NOTE — Telephone Encounter (Signed)
Left message to call back to set up tele pre op appt.  

## 2022-12-26 NOTE — Telephone Encounter (Signed)
  Request for patient is needing cleareance  12/26/22  Jessica [name] Jun 06, 1951  What type of surgery is being performed? Esophagogastroduodenoscopy (EGD) with esophageal dilation  When is surgery scheduled? TBD  What type of clearance is required (medical or pharmacy to hold medication or both? Medical   Patient is needing cardiac clearance prior to being scheduled for procedure.  Name of physician performing surgery?  [facility] at [facility] Phone: [phone] Fax: [phone]  Anethesia type (none, local, MAC, general)? MAC

## 2022-12-27 ENCOUNTER — Other Ambulatory Visit: Payer: Self-pay

## 2022-12-27 DIAGNOSIS — K219 Gastro-esophageal reflux disease without esophagitis: Secondary | ICD-10-CM

## 2022-12-27 DIAGNOSIS — K529 Noninfective gastroenteritis and colitis, unspecified: Secondary | ICD-10-CM

## 2022-12-27 DIAGNOSIS — K5000 Crohn's disease of small intestine without complications: Secondary | ICD-10-CM

## 2022-12-27 NOTE — Telephone Encounter (Signed)
2nd attempt to reach the pt.

## 2022-12-30 ENCOUNTER — Ambulatory Visit (INDEPENDENT_AMBULATORY_CARE_PROVIDER_SITE_OTHER): Payer: Medicare Other | Admitting: Pulmonary Disease

## 2022-12-30 ENCOUNTER — Encounter (HOSPITAL_BASED_OUTPATIENT_CLINIC_OR_DEPARTMENT_OTHER): Payer: Self-pay | Admitting: Pulmonary Disease

## 2022-12-30 VITALS — BP 112/62 | HR 74 | Temp 98.3°F | Ht 62.0 in | Wt 166.6 lb

## 2022-12-30 DIAGNOSIS — D638 Anemia in other chronic diseases classified elsewhere: Secondary | ICD-10-CM | POA: Diagnosis not present

## 2022-12-30 DIAGNOSIS — G4733 Obstructive sleep apnea (adult) (pediatric): Secondary | ICD-10-CM | POA: Diagnosis not present

## 2022-12-30 DIAGNOSIS — N2581 Secondary hyperparathyroidism of renal origin: Secondary | ICD-10-CM | POA: Diagnosis not present

## 2022-12-30 DIAGNOSIS — N1832 Chronic kidney disease, stage 3b: Secondary | ICD-10-CM | POA: Diagnosis not present

## 2022-12-30 DIAGNOSIS — I129 Hypertensive chronic kidney disease with stage 1 through stage 4 chronic kidney disease, or unspecified chronic kidney disease: Secondary | ICD-10-CM | POA: Diagnosis not present

## 2022-12-30 NOTE — Telephone Encounter (Signed)
Thanks for the update. Jessica [name], please let the patient know that we will not be able to schedule her case as she did not answer any of the calls by the preop team to obtain clearance.  If she is still interested in undergoing the procedure, we will resend the request but she needs to be aware that she should answer their call to schedule a preop appointment.

## 2022-12-30 NOTE — Patient Instructions (Signed)
X dc CPAP Trial of BiPAP auto - IPAP max 25, EPAP min 8, PS +4 to [employer]

## 2022-12-30 NOTE — Assessment & Plan Note (Signed)
She has struggled with CPAP therapy.  Titration study showed better control of events with BiPAP 25/21 cm.  We will start her on auto BiPAP settings and see if she can settle down this. If not we will proceed with hypoglossal nerve stimulator implantation referral and sleep endoscopy  Weight loss encouraged, compliance with goal of at least 4-6 hrs every night is the expectation. Advised against medications with sedative side effects Cautioned against driving when sleepy - understanding that sleepiness will vary on a day to day basis

## 2022-12-30 NOTE — Telephone Encounter (Signed)
3rd attempt to reach the pt to schedule a tele pre op appt. I will update all parties involved. Will remove from the pre op call back pool until the pt calls back to set up tele pre op appt.

## 2022-12-30 NOTE — Progress Notes (Signed)
   Subjective:    Patient ID: Jessica [name], female    DOB: December 17, 1950, 72 y.o.   MRN: [id number]  HPI  72 yo for follow-up of OSA and dyspnea   PMH -Crohn's disease, hypertension, hyperlipidemia, CKD stage III, moderate MR -schwannoma RT ear  She continues to struggle with her CPAP machine.  We have tried mask fitting and change pressure settings. We undertook formal CPAP titration study and she is scheduled for BiPAP.  She was given an extra small fullface mask.  She has reverted back to her small mask due to better fit. CKD has gotten worse she reports nocturia she drinks a lot of water in the daytime   Significant tests/ events reviewed   10/2022 CPAP titration >> BiPAP 25/21 01/2021 HST showed mod  OSA with AHI 28/ hr 03/2021 TEE mod MR   Review of Systems neg for any significant sore throat, dysphagia, itching, sneezing, nasal congestion or excess/ purulent secretions, fever, chills, sweats, unintended wt loss, pleuritic or exertional cp, hempoptysis, orthopnea pnd or change in chronic leg swelling. Also denies presyncope, palpitations, heartburn, abdominal pain, nausea, vomiting, diarrhea or change in bowel or urinary habits, dysuria,hematuria, rash, arthralgias, visual complaints, headache, numbness weakness or ataxia.     Objective:   Physical Exam  Gen. Pleasant, obese, in no distress ENT - no lesions, no post nasal drip, upper partials + Neck: No JVD, no thyromegaly, no carotid bruits Lungs: no use of accessory muscles, no dullness to percussion, decreased without rales or rhonchi  Cardiovascular: Rhythm regular, heart sounds  normal, no murmurs or gallops, no peripheral edema Musculoskeletal: No deformities, no cyanosis or clubbing , no tremors        Assessment & Plan:

## 2022-12-31 ENCOUNTER — Telehealth (INDEPENDENT_AMBULATORY_CARE_PROVIDER_SITE_OTHER): Payer: Self-pay | Admitting: Gastroenterology

## 2022-12-31 NOTE — Telephone Encounter (Signed)
Marlowe Shores, LPN      6/57/84  2:27 PM Note Voicemail from patient wanting to schedule procedure.  Pt was seen in office on 12/26/22 at [facility]. EGD +/- ED ASA 3. Needs cardiac clearance. Pt did not returned pre op clearance call from cardiology. Resent clearance and pt states she will call them to get scheduled.

## 2022-12-31 NOTE — Telephone Encounter (Signed)
I left a message for the patient to call our office to schedule a tele visit for pre-op 

## 2022-12-31 NOTE — Telephone Encounter (Signed)
Voicemail from patient wanting to schedule procedure.  Pt was seen in office on 12/26/22 at [facility]. EGD +/- ED ASA 3. Needs cardiac clearance. Pt did not returned pre op clearance call from cardiology. Resent clearance and pt states she will call them to get scheduled.

## 2022-12-31 NOTE — Telephone Encounter (Signed)
   Name: Jessica [name]  DOB: 01-06-1951  MRN: [id number]  Primary Cardiologist: Dina Rich, MD   Preoperative team, please contact this patient and set up a phone call appointment for further preoperative risk assessment. Please obtain consent and complete medication review. Thank you for your help.  I confirm that guidance regarding antiplatelet and oral anticoagulation therapy has been completed and, if necessary, noted below.  None requested    Ronney Asters, NP 12/31/2022, 2:38 PM [facility]

## 2022-12-31 NOTE — Telephone Encounter (Signed)
LMTRC

## 2022-12-31 NOTE — Telephone Encounter (Signed)
What type of surgery is being performed? Esophagogastroduodenoscopy (EGD) with esophageal dilation   When is surgery scheduled? TBD   What type of clearance is required (medical or pharmacy to hold medication or both? Medical    Patient is needing cardiac clearance prior to being scheduled for procedure.   Name of physician performing surgery?  [facility] at [facility] Phone: [phone] Fax: [phone]   Anethesia type (none, local, MAC, general)? MAC  FYI-Pt is supposed to be calling to set up appt.

## 2023-01-01 ENCOUNTER — Telehealth: Payer: Self-pay | Admitting: *Deleted

## 2023-01-01 ENCOUNTER — Telehealth: Payer: Self-pay | Admitting: Cardiology

## 2023-01-01 ENCOUNTER — Other Ambulatory Visit (INDEPENDENT_AMBULATORY_CARE_PROVIDER_SITE_OTHER): Payer: Self-pay | Admitting: Gastroenterology

## 2023-01-01 NOTE — Telephone Encounter (Signed)
Attempted to contact patient- phone went straight to voicemail. Left message for patient to call office back regarding her SOB. (Please see previous note)

## 2023-01-01 NOTE — Telephone Encounter (Signed)
Received refill request for dicyclomine 10mg . Pt last OV 12/26/2022. Routing to you in absence of Brooke Bonito, NP

## 2023-01-01 NOTE — Telephone Encounter (Signed)
Scheduler Jasmine sent a secure chat to me that the pt was calling back to pre op for tele pre op appt. I was with another pt at the time and said I will need to call back.   I then called the pt back and got her vm again. I left message to call back.

## 2023-01-01 NOTE — Telephone Encounter (Signed)
Patient states she is returning a call. 

## 2023-01-01 NOTE — Telephone Encounter (Signed)
Thank you :)

## 2023-01-02 DIAGNOSIS — M810 Age-related osteoporosis without current pathological fracture: Secondary | ICD-10-CM | POA: Diagnosis not present

## 2023-01-02 NOTE — Telephone Encounter (Signed)
I have left a message again for the pt to call back to schedule a tele appt. We left a message 6/25, pt called back 6/26. I left a message 6/26 to call back to set up tele  appt. Left message again today. I will update the requesting office.

## 2023-01-02 NOTE — Telephone Encounter (Signed)
Left message to return call 

## 2023-01-02 NOTE — Telephone Encounter (Signed)
Patient was returning call. Please advise ?

## 2023-01-02 NOTE — Telephone Encounter (Signed)
Thanks for the update

## 2023-01-03 ENCOUNTER — Telehealth: Payer: Self-pay | Admitting: Cardiology

## 2023-01-03 ENCOUNTER — Encounter (INDEPENDENT_AMBULATORY_CARE_PROVIDER_SITE_OTHER): Payer: Self-pay

## 2023-01-03 NOTE — Telephone Encounter (Signed)
Sent pt a my chart message asking her to call Dr.Branch office to set up telephone appt

## 2023-01-03 NOTE — Telephone Encounter (Signed)
Pt returning nurses Santina Evans) call regarding scheduling an appointment. Please advise.

## 2023-01-05 DIAGNOSIS — I10 Essential (primary) hypertension: Secondary | ICD-10-CM | POA: Diagnosis not present

## 2023-01-06 ENCOUNTER — Telehealth: Payer: Self-pay | Admitting: Cardiology

## 2023-01-06 MED ORDER — METOPROLOL SUCCINATE ER 25 MG PO TB24: ORAL_TABLET | ORAL | 3 refills | Status: DC

## 2023-01-06 NOTE — Telephone Encounter (Signed)
Pt has been scheduled to see Sharlene Dory, NP 01/10/23 for SOB and pre op clearance. I will update all parties involved.

## 2023-01-06 NOTE — Telephone Encounter (Signed)
*  STAT* If patient is at the pharmacy, call can be transferred to refill team.   1. Which medications need to be refilled? (please list name of each medication and dose if known) Metoprolol  h pharmacy/location (including street and city if local pharmacy) is medication to be sent to? [facility] [place]  3. Do they need a 30 day or 90 day supply? 90 days and refills-please call today- out of medicine -

## 2023-01-06 NOTE — Telephone Encounter (Signed)
Thanks

## 2023-01-06 NOTE — Telephone Encounter (Signed)
Done

## 2023-01-10 ENCOUNTER — Ambulatory Visit: Payer: Medicare Other | Attending: Nurse Practitioner | Admitting: Nurse Practitioner

## 2023-01-10 ENCOUNTER — Encounter: Payer: Self-pay | Admitting: Nurse Practitioner

## 2023-01-10 VITALS — BP 122/70 | HR 75 | Ht 62.0 in | Wt 163.4 lb

## 2023-01-10 DIAGNOSIS — R5382 Chronic fatigue, unspecified: Secondary | ICD-10-CM | POA: Insufficient documentation

## 2023-01-10 DIAGNOSIS — Z79899 Other long term (current) drug therapy: Secondary | ICD-10-CM | POA: Diagnosis not present

## 2023-01-10 DIAGNOSIS — Z01810 Encounter for preprocedural cardiovascular examination: Secondary | ICD-10-CM | POA: Insufficient documentation

## 2023-01-10 DIAGNOSIS — N183 Chronic kidney disease, stage 3 unspecified: Secondary | ICD-10-CM | POA: Diagnosis not present

## 2023-01-10 DIAGNOSIS — Z01818 Encounter for other preprocedural examination: Secondary | ICD-10-CM | POA: Diagnosis not present

## 2023-01-10 DIAGNOSIS — T148XXA Other injury of unspecified body region, initial encounter: Secondary | ICD-10-CM | POA: Diagnosis not present

## 2023-01-10 DIAGNOSIS — Z8673 Personal history of transient ischemic attack (TIA), and cerebral infarction without residual deficits: Secondary | ICD-10-CM | POA: Insufficient documentation

## 2023-01-10 DIAGNOSIS — G4733 Obstructive sleep apnea (adult) (pediatric): Secondary | ICD-10-CM | POA: Insufficient documentation

## 2023-01-10 DIAGNOSIS — I340 Nonrheumatic mitral (valve) insufficiency: Secondary | ICD-10-CM | POA: Insufficient documentation

## 2023-01-10 DIAGNOSIS — D649 Anemia, unspecified: Secondary | ICD-10-CM | POA: Insufficient documentation

## 2023-01-10 DIAGNOSIS — I10 Essential (primary) hypertension: Secondary | ICD-10-CM | POA: Diagnosis not present

## 2023-01-10 DIAGNOSIS — R0609 Other forms of dyspnea: Secondary | ICD-10-CM | POA: Insufficient documentation

## 2023-01-10 MED ORDER — ROSUVASTATIN CALCIUM 10 MG PO TABS: 10.0000 mg | ORAL_TABLET | Freq: Every day | ORAL | 2 refills | Status: DC

## 2023-01-10 NOTE — Progress Notes (Signed)
Cardiology Office Note:  .   Date:  01/10/2023  ID:  Jessica [name], DOB [birth year], MRN [id number] PCP: Ignatius Specking, MD  [facility] Providers Cardiologist:  Dina Rich, MD    History of Present Illness: .   Jessica [name] is a 72 y.o. female with a PMH of palpitations, mitral valvular regurgitation, OSA, HTN, DOE, leg edema, chronic fatigue, past CVA, hypothyroidism, CKD (followed by Dr. Wolfgang Phoenix), who presents today for follow-up.   Echo and Lexi scan from 2023 benign.  Last seen by Dr. Dina Rich on September 18, 2022.  She noted follow-up palpitations at times, tolerable while on Toprol-XL.  She was considering knee replacement at that time.  Today she presents for follow-up and for pre-op cardiovascular risk assessment.  She states she is pending EGD and colonoscopy d/t low hemoglobin. CC today is chronic DOE/SHOB and fatigue. Will be switching from CPAP to BiPAP soon, follows Dr. Vassie Loll. Denies any chest pain, palpitations, syncope, presyncope, dizziness, orthopnea, PND, swelling or significant weight changes, or claudication. Does admit to easy, significant bruising.   Studies Reviewed: Marland Kitchen   EKG Interpretation Date/Time:  Friday January 10 2023 09:09:49 EDT Ventricular Rate:  75 PR Interval:  172 QRS Duration:  76 QT Interval:  362 QTC Calculation: 404 R Axis:   69  Text Interpretation: Normal sinus rhythm Inferior infarct , age undetermined Anterolateral infarct , age undetermined When compared with ECG of 03-Sep-2016 03:11, PREVIOUS ECG IS PRESENT Confirmed by Sharlene Dory [phone]) on 01/10/2023 9:16:58 AM   Lexiscan 03/2022:    The study is normal. The study is low risk.   No ST deviation was noted.   LV perfusion is normal. Small apical defect in resting and stress images with normal wall motion consistent with apical thinning.   Left ventricular function is normal. End diastolic cavity size is normal.  Echo 12/2021:  1. Left ventricular ejection fraction, by  estimation, is 55 to 60%. The  left ventricle has normal function. The left ventricle has no regional  wall motion abnormalities. Left ventricular diastolic parameters are  consistent with Grade I diastolic  dysfunction (impaired relaxation). The average left ventricular global  longitudinal strain is -19.4 %. The global longitudinal strain is normal.   2. Right ventricular systolic function is normal. The right ventricular  size is normal. There is normal pulmonary artery systolic pressure.   3. The mitral valve is abnormal. Mild to moderate mitral valve  regurgitation. No evidence of mitral stenosis.   4. The tricuspid valve is abnormal.   5. The aortic valve is tricuspid. Aortic valve regurgitation is not  visualized. No aortic stenosis is present.   6. The inferior vena cava is normal in size with greater than 50%  respiratory variability, suggesting right atrial pressure of 3 mmHg.   Comparison(s): TEE done 03/27/21 showed an EF of 60-65% with moderate MR.  Risk Assessment/Calculations:       The 10-year ASCVD risk score (Arnett DK, et al., 2019) is: 20.6%   Values used to calculate the score:     Age: 54 years     Sex: Female     Is Non-Hispanic African American: No     Diabetic: Yes     Tobacco smoker: No     Systolic Blood Pressure: 122 mmHg     Is BP treated: Yes     HDL Cholesterol: 45 mg/dL     Total Cholesterol: 162 mg/dL  Physical Exam:   VS:  BP 122/70   Pulse 75   Ht 5\' 2"  (1.575 m)   Wt 163 lb 6.4 oz (74.1 kg)   SpO2 97%   BMI 29.89 kg/m    Wt Readings from Last 3 Encounters:  01/10/23 163 lb 6.4 oz (74.1 kg)  12/30/22 166 lb 9.6 oz (75.6 kg)  12/26/22 166 lb 12.8 oz (75.7 kg)    GEN: Well nourished, well developed in no acute distress NECK: No JVD; No carotid bruits CARDIAC: S1/S2, RRR, no murmurs, rubs, gallops RESPIRATORY:  Clear to auscultation without rales, wheezing or rhonchi  ABDOMEN: Soft, non-tender, non-distended EXTREMITIES:  No edema,  bruising to bilateral arms; No deformity   ASSESSMENT AND PLAN: .    Pre-operative cardiovascular risk assessment6} Ms. Piacentini's perioperative risk of a major cardiac event is 0.9% according to the Revised Cardiac Risk Index (RCRI).  Therefore, she is at low risk for perioperative complications.   Her functional capacity is good at 5.07 METs according to the Duke Activity Status Index (DASI). Recommendations: According to ACC/AHA guidelines, no further cardiovascular testing needed.  The patient may proceed to surgery at acceptable risk.   Antiplatelet and/or Anticoagulation Recommendations: Not on any aspirin or anticoagulation that needs to be held prior to procedure. She may proceed to surgery at acceptable risk.  HTN BP stable. Discussed to monitor BP at home at least 2 hours after medications and sitting for 5-10 minutes. Continue current medication regimen. Heart healthy diet and regular cardiovascular exercise encouraged.   3. Chronic fatigue, DOE, OSA Etiology multifactorial. Follows Dr. Vassie Loll. Echo and Lexiscan benign last year. No medication changes at this time. Continue to follow with PCP and Pulmonology.   4. Mitral valvular regurgitation   Mild to mod MR noted on Echo last year, plan to update Echo 03/2023. Continue current meds. Heart healthy diet and regular cardiovascular exercise encouraged.   5. Past hx of CVA, medication management Chronic lacunar infarcts noted on MRI in 2020. Will increase Crestor to 10 mg daily and repeat FLP and LFT in 2 months per protocol.   6. CKD stage 3 Most recent labs show sCr at 1.30 eGFR 44. Avoid nephrotoxic agents. Continue to follow with PCP and Nephrology.   7. Anemia, easy bruising Hgb around 11, admits to easy bruising, normal platelet count. Will refer to Hematology for further evaluation. Care precautions discussed.   Dispo: Follow-up with me or APP in 6 months or sooner if anything changes.   Signed, Sharlene Dory, NP

## 2023-01-10 NOTE — Patient Instructions (Addendum)
Medication Instructions:  Your physician has recommended you make the following change in your medication:  Increase Crestor to 10 Mg daily Continue all other medications as prescribed   Labwork: FLP, Hepatic function in September at   Testing/Procedures: Your physician has requested that you have an echocardiogram. Echocardiography is a painless test that uses sound waves to create images of your heart. It provides your doctor with information about the size and shape of your heart and how well your heart's chambers and valves are working. This procedure takes approximately one hour. There are no restrictions for this procedure. Please do NOT wear cologne, perfume, aftershave, or lotions (deodorant is allowed). Please arrive 15 minutes prior to your appointment time.  Follow-Up: Your physician recommends that you schedule a follow-up appointment in: 6 Months with Philis Nettle  Any Other Special Instructions Will Be Listed Below (If Applicable). Referral sent to hematology in [place]   If you need a refill on your cardiac medications before your next appointment, please call your pharmacy.

## 2023-01-14 NOTE — Telephone Encounter (Signed)
Dolores Frame, MD  Sharlene Dory, NP; Marlowe Shores, LPN Thanks, Hi Kenney Houseman, please schedule her for procedure, cleared by cardiology Thanks  Will call pt once I have a room 3 available with [facility]

## 2023-01-20 ENCOUNTER — Telehealth: Payer: Self-pay | Admitting: Nurse Practitioner

## 2023-01-20 NOTE — Telephone Encounter (Signed)
Pt is requesting a callback regarding her being on vacation so she hadn't been able to speak with Philis Nettle. She stated she'd like to speak with her regarding a referral she made. Please advise

## 2023-01-20 NOTE — Telephone Encounter (Signed)
Sent patient a Wellsite geologist to let them know their referral still has the status of pend and they are working to get her in as soon as possible and that unfortunately these things take time. I also provided her with the hematology office's information for her to be able to reach out to them in regards of her referral.

## 2023-01-22 ENCOUNTER — Encounter: Payer: Self-pay | Admitting: *Deleted

## 2023-01-22 ENCOUNTER — Telehealth: Payer: Self-pay | Admitting: *Deleted

## 2023-01-22 DIAGNOSIS — N189 Chronic kidney disease, unspecified: Secondary | ICD-10-CM | POA: Diagnosis not present

## 2023-01-22 DIAGNOSIS — D649 Anemia, unspecified: Secondary | ICD-10-CM | POA: Diagnosis not present

## 2023-01-22 DIAGNOSIS — N2581 Secondary hyperparathyroidism of renal origin: Secondary | ICD-10-CM | POA: Diagnosis not present

## 2023-01-22 DIAGNOSIS — N1832 Chronic kidney disease, stage 3b: Secondary | ICD-10-CM | POA: Diagnosis not present

## 2023-01-22 DIAGNOSIS — D638 Anemia in other chronic diseases classified elsewhere: Secondary | ICD-10-CM | POA: Diagnosis not present

## 2023-01-22 DIAGNOSIS — R600 Localized edema: Secondary | ICD-10-CM | POA: Diagnosis not present

## 2023-01-22 DIAGNOSIS — G4733 Obstructive sleep apnea (adult) (pediatric): Secondary | ICD-10-CM | POA: Diagnosis not present

## 2023-01-22 DIAGNOSIS — E876 Hypokalemia: Secondary | ICD-10-CM | POA: Diagnosis not present

## 2023-01-22 DIAGNOSIS — I129 Hypertensive chronic kidney disease with stage 1 through stage 4 chronic kidney disease, or unspecified chronic kidney disease: Secondary | ICD-10-CM | POA: Diagnosis not present

## 2023-01-22 NOTE — Telephone Encounter (Signed)
Patient last ROV 12/31/22 with Dr. Vassie Loll. Patient states that she has not received her BiPap machine and would like to discuss next steps.  Please call and advise patient. [phone]

## 2023-01-22 NOTE — Telephone Encounter (Signed)
Patient has been advised to continue CPAP use until biPAP set up. Nothing further needed at this time.

## 2023-01-22 NOTE — Telephone Encounter (Signed)
Spoke with [employer], they state the request has been sent to [facility] [place] for approval. Per their system it shows as PENDING.  Spoke with patient and advised. She states she has not been using CPAP, as was told to stop at last OV. She states she "can tell a difference without it".  Please advise if ok for patient to restart CPAP until biPAP set up. Thank you!

## 2023-01-24 DIAGNOSIS — Z299 Encounter for prophylactic measures, unspecified: Secondary | ICD-10-CM | POA: Diagnosis not present

## 2023-01-24 DIAGNOSIS — G319 Degenerative disease of nervous system, unspecified: Secondary | ICD-10-CM | POA: Diagnosis not present

## 2023-01-24 DIAGNOSIS — I10 Essential (primary) hypertension: Secondary | ICD-10-CM | POA: Diagnosis not present

## 2023-01-24 DIAGNOSIS — R52 Pain, unspecified: Secondary | ICD-10-CM | POA: Diagnosis not present

## 2023-01-24 DIAGNOSIS — N1831 Chronic kidney disease, stage 3a: Secondary | ICD-10-CM | POA: Diagnosis not present

## 2023-01-24 DIAGNOSIS — R5383 Other fatigue: Secondary | ICD-10-CM | POA: Diagnosis not present

## 2023-01-28 ENCOUNTER — Ambulatory Visit (INDEPENDENT_AMBULATORY_CARE_PROVIDER_SITE_OTHER): Payer: Medicare Other | Admitting: Podiatry

## 2023-01-28 DIAGNOSIS — M722 Plantar fascial fibromatosis: Secondary | ICD-10-CM

## 2023-01-28 DIAGNOSIS — K529 Noninfective gastroenteritis and colitis, unspecified: Secondary | ICD-10-CM | POA: Diagnosis not present

## 2023-01-28 DIAGNOSIS — K219 Gastro-esophageal reflux disease without esophagitis: Secondary | ICD-10-CM | POA: Diagnosis not present

## 2023-01-28 DIAGNOSIS — K5000 Crohn's disease of small intestine without complications: Secondary | ICD-10-CM | POA: Diagnosis not present

## 2023-01-28 DIAGNOSIS — G629 Polyneuropathy, unspecified: Secondary | ICD-10-CM | POA: Diagnosis not present

## 2023-01-29 LAB — CBC WITH DIFFERENTIAL/PLATELET
Basophils Absolute: 0 10*3/uL (ref 0.0–0.2)
Basos: 1 %
EOS (ABSOLUTE): 0.3 10*3/uL (ref 0.0–0.4)
Eos: 7 %
Hematocrit: 32.7 % — ABNORMAL LOW (ref 34.0–46.6)
Hemoglobin: 10.8 g/dL — ABNORMAL LOW (ref 11.1–15.9)
Immature Grans (Abs): 0 10*3/uL (ref 0.0–0.1)
Immature Granulocytes: 1 %
Lymphocytes Absolute: 1.3 10*3/uL (ref 0.7–3.1)
Lymphs: 28 %
MCH: 32.2 pg (ref 26.6–33.0)
MCHC: 33 g/dL (ref 31.5–35.7)
MCV: 98 fL — ABNORMAL HIGH (ref 79–97)
Monocytes Absolute: 0.4 10*3/uL (ref 0.1–0.9)
Monocytes: 9 %
Neutrophils Absolute: 2.5 10*3/uL (ref 1.4–7.0)
Neutrophils: 54 %
Platelets: 138 10*3/uL — ABNORMAL LOW (ref 150–450)
RBC: 3.35 x10E6/uL — ABNORMAL LOW (ref 3.77–5.28)
RDW: 12.2 % (ref 11.7–15.4)
WBC: 4.5 10*3/uL (ref 3.4–10.8)

## 2023-01-29 NOTE — Progress Notes (Signed)
She presents today for follow-up of her painful feet and neuropathy.  She states that currently she feels that the gabapentin is helping particularly in the evening but not as much during the day states that she still has a lot of pain while she is walking.  She does relate significant morning pain upon ambulation.  Objective: Vital signs are stable she is alert and oriented x 3 she still has tenderness on range of motion of the forefoot at the metatarsal phalangeal joints.  She also has some posterior tibial tenderness on palpation noting a new finding is the pain on palpation of the plantar fascia.  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  Plan: I injected the bilateral heels today with 10 mg Kenalog 5 mg and Marcaine.  She will continue her gabapentin as prescribed and I will follow-up with her in 6 weeks

## 2023-01-30 DIAGNOSIS — G2581 Restless legs syndrome: Secondary | ICD-10-CM | POA: Diagnosis not present

## 2023-01-30 DIAGNOSIS — M7918 Myalgia, other site: Secondary | ICD-10-CM | POA: Diagnosis not present

## 2023-01-30 DIAGNOSIS — G894 Chronic pain syndrome: Secondary | ICD-10-CM | POA: Diagnosis not present

## 2023-01-30 DIAGNOSIS — Z79899 Other long term (current) drug therapy: Secondary | ICD-10-CM | POA: Diagnosis not present

## 2023-02-05 ENCOUNTER — Other Ambulatory Visit: Payer: Self-pay | Admitting: Podiatry

## 2023-02-05 ENCOUNTER — Telehealth: Payer: Self-pay | Admitting: Pulmonary Disease

## 2023-02-05 DIAGNOSIS — I10 Essential (primary) hypertension: Secondary | ICD-10-CM | POA: Diagnosis not present

## 2023-02-05 NOTE — Telephone Encounter (Signed)
I will send this to Jessica [name] since she faxes those for [place] they do not come back here

## 2023-02-05 NOTE — Telephone Encounter (Signed)
Patient called [employer] and they are needing CMN returned before they can send patients BiPAP. Confirmed with Mardella Layman that CMN is in [facility] waiting for Alva's signature. Can we patient the patient when CMN is signed and sent off?

## 2023-02-06 NOTE — Telephone Encounter (Signed)
Noted  

## 2023-02-11 ENCOUNTER — Encounter: Payer: Medicare Other | Admitting: Hematology

## 2023-02-11 NOTE — Progress Notes (Signed)
[facility] [address] [place] [address]   Clinic Day:  02/11/2023  Referring physician: Sharlene Dory, NP  Patient Care Team: Ignatius Specking, MD as PCP - General (Internal Medicine) Wyline Mood Dorothe Pea, MD as PCP - Cardiology (Cardiology) Ollen Gross, MD as Consulting Physician (Orthopedic Surgery)   ASSESSMENT & PLAN:   Assessment: ***  Plan: ***  No orders of the defined types were placed in this encounter.     Jessica [name],acting as a Neurosurgeon for Doreatha Massed, MD.,have documented all relevant documentation on the behalf of Doreatha Massed, MD,as directed by  Doreatha Massed, MD while in the presence of Doreatha Massed, MD.   ***  [place] R [name]   8/6/20249:24 PM  CHIEF COMPLAINT/PURPOSE OF CONSULT:   Diagnosis: ***  Current Therapy:  ***  HISTORY OF PRESENT ILLNESS:   Jessica [name] is a 72 y.o. female presenting to clinic today for evaluation of anemia and easily bruising at the request of Sharlene Dory, NP.  Today, she states that she is doing well overall. Her appetite level is at ***%. Her energy level is at ***%.  She was found to have abnormal CBC from 01/28/23 with decreased RBC at 3.35, decreased hemoglobin 10.8, decreased HCT at 32.7, elevated MCV at 98, and decreased platelets at 138.  Of note, patient used to have [facility]'s.  ***She denies recent chest pain on exertion, shortness of breath on minimal exertion, pre-syncopal episodes, or palpitations. ***She had not noticed any recent bleeding such as epistaxis, hematuria or hematochezia ***The patient denies over the counter NSAID ingestion. She is not *** on antiplatelets agents. Her last colonoscopy was *** ***She had no prior history or diagnosis of cancer. She denies any family history of cancer.  *** Her age appropriate screening programs are up-to-date. ***She denies any pica and eats a variety of diet. ***She never donated blood or received blood  transfusion. ***The patient was prescribed oral iron supplements and she takes ***  PAST MEDICAL HISTORY:   Past Medical History: Past Medical History:  Diagnosis Date   Anxiety attack    Arthritis    Back pain, chronic    Chronic kidney disease    Crohn's colitis (HCC)    Depression (emotion)    Dysrhythmia    GERD (gastroesophageal reflux disease)    Hypertension    Hypothyroid    Neuropathy    Sleep apnea    cpap is not working per patient - needs to find a new doctor - not used since 7/16 per pat    Stage 3 chronic kidney disease (HCC)    Vertigo    followed by Dr Angelena Sole- in [place]     Surgical History: Past Surgical History:  Procedure Laterality Date   ABDOMINAL HYSTERECTOMY     BIOPSY  06/08/2018   Procedure: BIOPSY;  Surgeon: Malissa Hippo, MD;  [facility];  Service: Endoscopy;;  (colon)   CHOLECYSTECTOMY     COLON SURGERY     for Crohn's in the 80s in [place]   COLONOSCOPY N/A 03/11/2013   Procedure: COLONOSCOPY;  Surgeon: Malissa Hippo, MD;  [facility]: [facility];  Service: Endoscopy;  Laterality: N/A;  1200   COLONOSCOPY N/A 06/08/2018   Procedure: COLONOSCOPY;  Surgeon: Malissa Hippo, MD;  Location: [facility];  Service: Endoscopy;  Laterality: N/A;  8:25   fatty tumor removed     HARDWARE REMOVAL Right 08/07/2016   Procedure: HARDWARE REMOVAL;  Surgeon:  Ollen Gross, MD;  Location: [facility];  Service: Orthopedics;  Laterality: Right;   Hip replacement      Rt hip in 2010 in [place]   KNEE ARTHROSCOPY Left 03/29/2015   Procedure: ARTHROSCOPY LEFT KNEE WITH MENSICAL DEBRIDEMENT, chondroplasty;  Surgeon: Ollen Gross, MD;  Location: [facility];  Service: Orthopedics;  Laterality: Left;   ORIF TIBIA PLATEAU Right 02/29/2016   Procedure: OPEN REDUCTION INTERNAL FIXATION (ORIF RIGHT  TIBIAL PLATEAU FRACTURE;  Surgeon: Ollen Gross, MD;  Location: [facility];  Service: Orthopedics;  Laterality: Right;   TEE WITHOUT CARDIOVERSION N/A 03/27/2021    Procedure: TRANSESOPHAGEAL ECHOCARDIOGRAM (TEE);  Surgeon: Antoine Poche, MD;  [facility];  Service: Endoscopy;  Laterality: N/A;   TOTAL KNEE ARTHROPLASTY Right 08/25/2017   Procedure: TOTAL RIGHT KNEE ARTHROPLASTY;  Surgeon: Ollen Gross, MD;  Location: [facility];  Service: Orthopedics;  Laterality: Right;    Social History: Social History   Socioeconomic History   Marital status: Married    Spouse name: Not on file   Number of children: Not on file   Years of education: Not on file   Highest education level: Not on file  Occupational History   Not on file  Tobacco Use   Smoking status: Never   Smokeless tobacco: Never  Vaping Use   Vaping status: Never Used  Substance and Sexual Activity   Alcohol use: No    Alcohol/week: 0.0 standard drinks of alcohol   Drug use: No   Sexual activity: Not Currently  Other Topics Concern   Not on file  Social History Narrative   Not on file   Social Determinants of Health   Financial Resource Strain: Not on file  Food Insecurity: Not on file  Transportation Needs: Not on file  Physical Activity: Not on file  Stress: Not on file  Social Connections: Not on file  Intimate Partner Violence: Not on file    Family History: Family History  Problem Relation Age of Onset   Crohn's disease Brother    Depression Mother    Anxiety disorder Mother    Depression Sister    Aneurysm Father    Colon cancer Neg Hx     Current Medications:  Current Outpatient Medications:    amLODipine (NORVASC) 10 MG tablet, Take 1 tablet by mouth once daily, Disp: 90 tablet, Rfl: 1   dicyclomine (BENTYL) 10 MG capsule, Take 1 capsule (10 mg total) by mouth 2 (two) times daily before a meal., Disp: 60 capsule, Rfl: 5   fluticasone (FLONASE) 50 MCG/ACT nasal spray, Place 1 spray into the nose daily as needed for allergies or rhinitis., Disp: , Rfl:    gabapentin (NEURONTIN) 300 MG capsule, TAKE 1 CAPSULE BY MOUTH THREE TIMES DAILY, Disp: 90  capsule, Rfl: 0   hydrALAZINE (APRESOLINE) 50 MG tablet, Take 50 mg by mouth 2 (two) times daily., Disp: , Rfl:    HYDROcodone-acetaminophen (NORCO/VICODIN) 5-325 MG tablet, Take 1 tablet by mouth at bedtime., Disp: , Rfl:    levothyroxine (SYNTHROID) 100 MCG tablet, Take 100 mcg by mouth daily., Disp: , Rfl:    loperamide (IMODIUM) 2 MG capsule, TAKE 1 CAPSULE BY MOUTH TWICE DAILY AS NEEDED FOR LOOSE STOOLS, Disp: 180 capsule, Rfl: 1   methocarbamol (ROBAXIN) 500 MG tablet, , Disp: , Rfl:    metoprolol succinate (TOPROL-XL) 25 MG 24 hr tablet, TAKE 1 & 1/2 (ONE & ONE-HALF) TABLETS BY MOUTH ONCE DAILY, Disp: 45 tablet, Rfl: 3   Multiple  Vitamin (MULTIVITAMIN WITH MINERALS) TABS tablet, Take 1 tablet by mouth daily., Disp: , Rfl:    pantoprazole (PROTONIX) 40 MG tablet, Take 1 tablet (40 mg total) by mouth daily before breakfast., Disp: 90 tablet, Rfl: 3   potassium chloride (KLOR-CON) 10 MEQ tablet, Take 10 mEq by mouth 2 (two) times daily., Disp: , Rfl:    propranolol (INDERAL) 20 MG tablet, Take 1 tablet (20 mg total) by mouth 3 (three) times daily as needed. (Patient not taking: Reported on 01/10/2023), Disp: 90 tablet, Rfl: 11   rOPINIRole (REQUIP) 0.5 MG tablet, Take 0.5 mg by mouth in the morning and at bedtime., Disp: , Rfl:    rosuvastatin (CRESTOR) 10 MG tablet, Take 1 tablet (10 mg total) by mouth daily., Disp: 90 tablet, Rfl: 2   sertraline (ZOLOFT) 100 MG tablet, TAKE 1 & 1/2 (ONE & ONE-HALF) TABLETS BY MOUTH ONCE DAILY, Disp: 135 tablet, Rfl: 0   spironolactone (ALDACTONE) 50 MG tablet, Take 50 mg by mouth daily., Disp: , Rfl:    tiZANidine (ZANAFLEX) 4 MG tablet, , Disp: , Rfl:    Allergies: Allergies  Allergen Reactions   Morphine And Codeine Hives and Itching    May cause blood pressure to drop   Robaxin [Methocarbamol] Itching   Sulfa Antibiotics Itching    ALL SULFA DRUGS    REVIEW OF SYSTEMS:   Review of Systems  Constitutional:  Negative for chills, fatigue and fever.   HENT:   Negative for lump/mass, mouth sores, nosebleeds, sore throat and trouble swallowing.   Eyes:  Negative for eye problems.  Respiratory:  Negative for cough and shortness of breath.   Cardiovascular:  Negative for chest pain, leg swelling and palpitations.  Gastrointestinal:  Negative for abdominal pain, constipation, diarrhea, nausea and vomiting.  Genitourinary:  Negative for bladder incontinence, difficulty urinating, dysuria, frequency, hematuria and nocturia.   Musculoskeletal:  Negative for arthralgias, back pain, flank pain, myalgias and neck pain.  Skin:  Negative for itching and rash.  Neurological:  Negative for dizziness, headaches and numbness.  Hematological:  Does not bruise/bleed easily.  Psychiatric/Behavioral:  Negative for depression, sleep disturbance and suicidal ideas. The patient is not nervous/anxious.   All other systems reviewed and are negative.    VITALS:   There were no vitals taken for this visit.  Wt Readings from Last 3 Encounters:  01/10/23 163 lb 6.4 oz (74.1 kg)  12/30/22 166 lb 9.6 oz (75.6 kg)  12/26/22 166 lb 12.8 oz (75.7 kg)    There is no height or weight on file to calculate BMI.   PHYSICAL EXAM:   Physical Exam Vitals and nursing note reviewed. Exam conducted with a chaperone present.  Constitutional:      Appearance: Normal appearance.  Cardiovascular:     Rate and Rhythm: Normal rate and regular rhythm.     Pulses: Normal pulses.     Heart sounds: Normal heart sounds.  Pulmonary:     Effort: Pulmonary effort is normal.     Breath sounds: Normal breath sounds.  Abdominal:     Palpations: Abdomen is soft. There is no hepatomegaly, splenomegaly or mass.     Tenderness: There is no abdominal tenderness.  Musculoskeletal:     Right lower leg: No edema.     Left lower leg: No edema.  Lymphadenopathy:     Cervical: No cervical adenopathy.     Right cervical: No superficial, deep or posterior cervical adenopathy.    Left  cervical: No superficial,  deep or posterior cervical adenopathy.     Upper Body:     Right upper body: No supraclavicular or axillary adenopathy.     Left upper body: No supraclavicular or axillary adenopathy.  Neurological:     General: No focal deficit present.     Mental Status: She is alert and oriented to person, place, and time.  Psychiatric:        Mood and Affect: Mood normal.        Behavior: Behavior normal.     LABS:      Latest Ref Rng & Units 01/28/2023   11:53 AM 02/17/2019    3:29 PM 08/28/2017    5:31 AM  CBC  WBC 3.4 - 10.8 x10E3/uL 4.5  4.6  6.0   Hemoglobin 11.1 - 15.9 g/dL 16.1  09.6  8.9   Hematocrit 34.0 - 46.6 % 32.7  35.7  26.2   Platelets 150 - 450 x10E3/uL 138  136  147       Latest Ref Rng & Units 03/27/2022    9:52 AM 03/22/2021    1:32 PM 05/10/2019    2:59 PM  CMP  Glucose 70 - 99 mg/dL 94  98    BUN 8 - 23 mg/dL 20  20    Creatinine 0.45 - 1.00 mg/dL 4.09  8.11  9.14   Sodium 135 - 145 mmol/L 139  135    Potassium 3.5 - 5.1 mmol/L 4.3  3.3    Chloride 98 - 111 mmol/L 112  98    CO2 22 - 32 mmol/L 23  29    Calcium 8.9 - 10.3 mg/dL 8.7  9.1       No results found for: "CEA1", "CEA" / No results found for: "CEA1", "CEA" No results found for: "PSA1" No results found for: "NWG956" No results found for: "CAN125"  No results found for: "TOTALPROTELP", "ALBUMINELP", "A1GS", "A2GS", "BETS", "BETA2SER", "GAMS", "MSPIKE", "SPEI" No results found for: "TIBC", "FERRITIN", "IRONPCTSAT" No results found for: "LDH"   STUDIES:   No results found.

## 2023-02-12 ENCOUNTER — Inpatient Hospital Stay: Payer: Medicare Other | Attending: Hematology | Admitting: Hematology

## 2023-02-12 ENCOUNTER — Inpatient Hospital Stay: Payer: Medicare Other

## 2023-02-12 VITALS — BP 125/77 | HR 95 | Temp 98.1°F | Resp 20 | Ht 62.0 in | Wt 159.7 lb

## 2023-02-12 DIAGNOSIS — Z818 Family history of other mental and behavioral disorders: Secondary | ICD-10-CM | POA: Insufficient documentation

## 2023-02-12 DIAGNOSIS — M7989 Other specified soft tissue disorders: Secondary | ICD-10-CM | POA: Insufficient documentation

## 2023-02-12 DIAGNOSIS — Z885 Allergy status to narcotic agent status: Secondary | ICD-10-CM | POA: Diagnosis not present

## 2023-02-12 DIAGNOSIS — D649 Anemia, unspecified: Secondary | ICD-10-CM | POA: Diagnosis not present

## 2023-02-12 DIAGNOSIS — K219 Gastro-esophageal reflux disease without esophagitis: Secondary | ICD-10-CM | POA: Insufficient documentation

## 2023-02-12 DIAGNOSIS — Z90710 Acquired absence of both cervix and uterus: Secondary | ICD-10-CM | POA: Insufficient documentation

## 2023-02-12 DIAGNOSIS — R233 Spontaneous ecchymoses: Secondary | ICD-10-CM | POA: Insufficient documentation

## 2023-02-12 DIAGNOSIS — K648 Other hemorrhoids: Secondary | ICD-10-CM | POA: Diagnosis not present

## 2023-02-12 DIAGNOSIS — Z882 Allergy status to sulfonamides status: Secondary | ICD-10-CM | POA: Diagnosis not present

## 2023-02-12 DIAGNOSIS — K50911 Crohn's disease, unspecified, with rectal bleeding: Secondary | ICD-10-CM | POA: Insufficient documentation

## 2023-02-12 DIAGNOSIS — Z8379 Family history of other diseases of the digestive system: Secondary | ICD-10-CM | POA: Diagnosis not present

## 2023-02-12 DIAGNOSIS — Z79899 Other long term (current) drug therapy: Secondary | ICD-10-CM | POA: Diagnosis not present

## 2023-02-12 DIAGNOSIS — G4730 Sleep apnea, unspecified: Secondary | ICD-10-CM | POA: Diagnosis not present

## 2023-02-12 DIAGNOSIS — Z8249 Family history of ischemic heart disease and other diseases of the circulatory system: Secondary | ICD-10-CM | POA: Insufficient documentation

## 2023-02-12 DIAGNOSIS — Z9049 Acquired absence of other specified parts of digestive tract: Secondary | ICD-10-CM | POA: Insufficient documentation

## 2023-02-12 HISTORY — DX: Anemia, unspecified: D64.9

## 2023-02-12 LAB — CBC WITH DIFFERENTIAL/PLATELET
Abs Immature Granulocytes: 0.05 10*3/uL (ref 0.00–0.07)
Basophils Absolute: 0 10*3/uL (ref 0.0–0.1)
Basophils Relative: 0 %
Eosinophils Absolute: 0.1 10*3/uL (ref 0.0–0.5)
Eosinophils Relative: 2 %
HCT: 34.9 % — ABNORMAL LOW (ref 36.0–46.0)
Hemoglobin: 11.6 g/dL — ABNORMAL LOW (ref 12.0–15.0)
Immature Granulocytes: 1 %
Lymphocytes Relative: 21 %
Lymphs Abs: 1.4 10*3/uL (ref 0.7–4.0)
MCH: 32.6 pg (ref 26.0–34.0)
MCHC: 33.2 g/dL (ref 30.0–36.0)
MCV: 98 fL (ref 80.0–100.0)
Monocytes Absolute: 0.5 10*3/uL (ref 0.1–1.0)
Monocytes Relative: 8 %
Neutro Abs: 4.6 10*3/uL (ref 1.7–7.7)
Neutrophils Relative %: 68 %
Platelets: 157 10*3/uL (ref 150–400)
RBC: 3.56 MIL/uL — ABNORMAL LOW (ref 3.87–5.11)
RDW: 12.4 % (ref 11.5–15.5)
WBC: 6.8 10*3/uL (ref 4.0–10.5)
nRBC: 0 % (ref 0.0–0.2)

## 2023-02-12 LAB — RETICULOCYTES
Immature Retic Fract: 13.7 % (ref 2.3–15.9)
RBC.: 3.54 MIL/uL — ABNORMAL LOW (ref 3.87–5.11)
Retic Count, Absolute: 73.3 10*3/uL (ref 19.0–186.0)
Retic Ct Pct: 2.1 % (ref 0.4–3.1)

## 2023-02-12 LAB — LACTATE DEHYDROGENASE: LDH: 159 U/L (ref 98–192)

## 2023-02-12 LAB — IRON AND TIBC
Iron: 94 ug/dL (ref 28–170)
Saturation Ratios: 21 % (ref 10.4–31.8)
TIBC: 439 ug/dL (ref 250–450)
UIBC: 345 ug/dL

## 2023-02-12 LAB — FOLATE: Folate: 33.5 ng/mL (ref >5.9)

## 2023-02-12 LAB — HEPATITIS B SURFACE ANTIBODY,QUALITATIVE: Hep B S Ab: NONREACTIVE

## 2023-02-12 LAB — VITAMIN B12: Vitamin B-12: 321 pg/mL (ref 180–914)

## 2023-02-12 LAB — PROTIME-INR
INR: 1 (ref 0.8–1.2)
Prothrombin Time: 13.8 seconds (ref 11.4–15.2)

## 2023-02-12 LAB — HEPATITIS B CORE ANTIBODY, TOTAL: Hep B Core Total Ab: NONREACTIVE

## 2023-02-12 LAB — FIBRINOGEN: Fibrinogen: 266 mg/dL (ref 210–475)

## 2023-02-12 LAB — HEPATITIS B SURFACE ANTIGEN: Hepatitis B Surface Ag: NONREACTIVE

## 2023-02-12 LAB — HEPATITIS C ANTIBODY: HCV Ab: NONREACTIVE

## 2023-02-12 LAB — APTT: aPTT: 24 seconds (ref 24–36)

## 2023-02-12 LAB — FERRITIN: Ferritin: 31 ng/mL (ref 11–307)

## 2023-02-12 NOTE — Patient Instructions (Addendum)
[facility]  Discharge Instructions  You were seen and examined today by Dr. Ellin Saba. Dr. Ellin Saba is a hematologist, meaning that he specializes in blood abnormalities. Dr. Ellin Saba discussed your past medical history, family history of cancers/blood conditions and the events that led to you being here today.  You were referred to Dr. Ellin Saba due to anemia and bruising.  Dr. Ellin Saba has recommended additional labs today for further evaluation.  Follow-up as scheduled.  Thank you for choosing [facility] to provide your oncology and hematology care.   To afford each patient quality time with our provider, please arrive at least 15 minutes before your scheduled appointment time. You may need to reschedule your appointment if you arrive late (10 or more minutes). Arriving late affects you and other patients whose appointments are after yours.  Also, if you miss three or more appointments without notifying the office, you may be dismissed from the clinic at the provider's discretion.    Again, thank you for choosing [facility].  Our hope is that these requests will decrease the amount of time that you wait before being seen by our physicians.   If you have a lab appointment with the Cancer Center - please note that after April 8th, all labs will be drawn in the cancer center.  You do not have to check in or register with the main entrance as you have in the past but will complete your check-in at the cancer center.            _____________________________________________________________  Should you have questions after your visit to [facility], please contact our office at [phone] and follow the prompts.  Our office hours are 8:00 a.m. to 4:30 p.m. Monday - Thursday and 8:00 a.m. to 2:30 p.m. Friday.  Please note that voicemails left after 4:00 p.m. may not be returned until the following business  day.  We are closed weekends and all major holidays.  You do have access to a nurse 24-7, just call the main number to the clinic [phone] and do not press any options, hold on the line and a nurse will answer the phone.    For prescription refill requests, have your pharmacy contact our office and allow 72 hours.    Masks are no longer required in the cancer centers. If you would like for your care team to wear a mask while they are taking care of you, please let them know. You may have one support person who is at least 70 years old accompany you for your appointments.

## 2023-02-14 ENCOUNTER — Telehealth: Payer: Self-pay | Admitting: Cardiology

## 2023-02-14 DIAGNOSIS — I129 Hypertensive chronic kidney disease with stage 1 through stage 4 chronic kidney disease, or unspecified chronic kidney disease: Secondary | ICD-10-CM | POA: Diagnosis not present

## 2023-02-14 DIAGNOSIS — N1832 Chronic kidney disease, stage 3b: Secondary | ICD-10-CM | POA: Diagnosis not present

## 2023-02-14 NOTE — Telephone Encounter (Signed)
Labs ordered by Dr Kirtland Bouchard from heme/onc, several complex specific labs looking into her easy bruising and anemia. Not labs I usually order or would be familiar enough with to translate for her, would have to defer to heme/onc  Dominga Ferry MD

## 2023-02-14 NOTE — Telephone Encounter (Signed)
New Message:       Patient said she had a lot of lab work did at [facility] on 02-12-23. She wants to be sure that Dr Wyline Mood review them for her. He did not order it, but she wants to him review this for her and call her please.a

## 2023-02-14 NOTE — Telephone Encounter (Signed)
I left Dr.Branch's response on her phone.

## 2023-02-14 NOTE — Telephone Encounter (Signed)
I will FYI Dr.Branch ?

## 2023-02-26 ENCOUNTER — Emergency Department (HOSPITAL_COMMUNITY)
Admission: EM | Admit: 2023-02-26 | Discharge: 2023-02-27 | Disposition: A | Discharge status: Home / Self Care | Payer: Medicare Other | Attending: Emergency Medicine | Admitting: Emergency Medicine

## 2023-02-26 ENCOUNTER — Other Ambulatory Visit: Payer: Self-pay

## 2023-02-26 DIAGNOSIS — R112 Nausea with vomiting, unspecified: Secondary | ICD-10-CM | POA: Diagnosis not present

## 2023-02-26 DIAGNOSIS — F419 Anxiety disorder, unspecified: Secondary | ICD-10-CM

## 2023-02-26 DIAGNOSIS — R251 Tremor, unspecified: Secondary | ICD-10-CM | POA: Insufficient documentation

## 2023-02-26 DIAGNOSIS — N183 Chronic kidney disease, stage 3 unspecified: Secondary | ICD-10-CM | POA: Diagnosis not present

## 2023-02-26 DIAGNOSIS — Z79899 Other long term (current) drug therapy: Secondary | ICD-10-CM | POA: Insufficient documentation

## 2023-02-26 DIAGNOSIS — I129 Hypertensive chronic kidney disease with stage 1 through stage 4 chronic kidney disease, or unspecified chronic kidney disease: Secondary | ICD-10-CM | POA: Insufficient documentation

## 2023-02-26 DIAGNOSIS — E039 Hypothyroidism, unspecified: Secondary | ICD-10-CM | POA: Insufficient documentation

## 2023-02-26 DIAGNOSIS — I10 Essential (primary) hypertension: Secondary | ICD-10-CM | POA: Diagnosis not present

## 2023-02-26 DIAGNOSIS — D696 Thrombocytopenia, unspecified: Secondary | ICD-10-CM | POA: Diagnosis not present

## 2023-02-26 DIAGNOSIS — F410 Panic disorder [episodic paroxysmal anxiety] without agoraphobia: Secondary | ICD-10-CM | POA: Insufficient documentation

## 2023-02-26 NOTE — ED Triage Notes (Signed)
Pt states he has tinnitus in her right ear, causing her to have anxiety. Husband at bedside reports that she has been seen by oncology for unexplained bruising, per husband pt has been reading her my chart and "googling" causing her to have an anxiety attack tonight. Pt is very anxious and tearful in triage.

## 2023-02-27 DIAGNOSIS — F410 Panic disorder [episodic paroxysmal anxiety] without agoraphobia: Secondary | ICD-10-CM | POA: Diagnosis not present

## 2023-02-27 LAB — URINALYSIS, ROUTINE W REFLEX MICROSCOPIC
Bilirubin Urine: NEGATIVE
Glucose, UA: NEGATIVE mg/dL
Hgb urine dipstick: NEGATIVE
Ketones, ur: NEGATIVE mg/dL
Leukocytes,Ua: NEGATIVE
Nitrite: NEGATIVE
Protein, ur: NEGATIVE mg/dL
Specific Gravity, Urine: 1.006 (ref 1.005–1.030)
pH: 7 (ref 5.0–8.0)

## 2023-02-27 LAB — CBC WITH DIFFERENTIAL/PLATELET
Abs Immature Granulocytes: 0.02 10*3/uL (ref 0.00–0.07)
Basophils Absolute: 0 10*3/uL (ref 0.0–0.1)
Basophils Relative: 0 %
Eosinophils Absolute: 0.3 10*3/uL (ref 0.0–0.5)
Eosinophils Relative: 6 %
HCT: 30.3 % — ABNORMAL LOW (ref 36.0–46.0)
Hemoglobin: 10.3 g/dL — ABNORMAL LOW (ref 12.0–15.0)
Immature Granulocytes: 0 %
Lymphocytes Relative: 24 %
Lymphs Abs: 1.2 10*3/uL (ref 0.7–4.0)
MCH: 32.3 pg (ref 26.0–34.0)
MCHC: 34 g/dL (ref 30.0–36.0)
MCV: 95 fL (ref 80.0–100.0)
Monocytes Absolute: 0.5 10*3/uL (ref 0.1–1.0)
Monocytes Relative: 9 %
Neutro Abs: 3.2 10*3/uL (ref 1.7–7.7)
Neutrophils Relative %: 61 %
Platelets: 108 10*3/uL — ABNORMAL LOW (ref 150–400)
RBC: 3.19 MIL/uL — ABNORMAL LOW (ref 3.87–5.11)
RDW: 11.9 % (ref 11.5–15.5)
WBC: 5.2 10*3/uL (ref 4.0–10.5)
nRBC: 0 % (ref 0.0–0.2)

## 2023-02-27 LAB — COMPREHENSIVE METABOLIC PANEL
ALT: 14 U/L (ref 0–44)
AST: 23 U/L (ref 15–41)
Albumin: 3.7 g/dL (ref 3.5–5.0)
Alkaline Phosphatase: 74 U/L (ref 38–126)
Anion gap: 9 (ref 5–15)
BUN: 18 mg/dL (ref 8–23)
CO2: 22 mmol/L (ref 22–32)
Calcium: 8.6 mg/dL — ABNORMAL LOW (ref 8.9–10.3)
Chloride: 105 mmol/L (ref 98–111)
Creatinine, Ser: 1.56 mg/dL — ABNORMAL HIGH (ref 0.44–1.00)
GFR, Estimated: 36 mL/min — ABNORMAL LOW (ref >60)
Glucose, Bld: 117 mg/dL — ABNORMAL HIGH (ref 70–99)
Potassium: 3.8 mmol/L (ref 3.5–5.1)
Sodium: 136 mmol/L (ref 135–145)
Total Bilirubin: 0.7 mg/dL (ref 0.3–1.2)
Total Protein: 6.2 g/dL — ABNORMAL LOW (ref 6.5–8.1)

## 2023-02-27 LAB — LIPASE, BLOOD: Lipase: 37 U/L (ref 11–51)

## 2023-02-27 MED ORDER — ONDANSETRON HCL 4 MG/2ML IJ SOLN
4.0000 mg | Freq: Once | INTRAMUSCULAR | Status: AC
  Administered 2023-02-27: 4 mg via INTRAVENOUS
  Filled 2023-02-27: qty 2

## 2023-02-27 MED ORDER — ONDANSETRON 4 MG PO TBDP: 4.0000 mg | ORAL_TABLET | Freq: Three times a day (TID) | ORAL | 0 refills | Status: DC | PRN

## 2023-02-27 MED ORDER — SODIUM CHLORIDE 0.9 % IV BOLUS
1000.0000 mL | Freq: Once | INTRAVENOUS | Status: AC
  Administered 2023-02-27: 1000 mL via INTRAVENOUS

## 2023-02-27 MED ORDER — LORAZEPAM 1 MG PO TABS
1.0000 mg | ORAL_TABLET | Freq: Once | ORAL | Status: AC
  Administered 2023-02-27: 1 mg via ORAL
  Filled 2023-02-27: qty 1

## 2023-02-27 NOTE — Discharge Instructions (Addendum)
You are seen today for nausea and vomiting.  Your workup is reassuring.  Take medications as prescribed.  Make sure to follow-up closely with oncology regarding your recent lab work.

## 2023-02-27 NOTE — ED Provider Notes (Signed)
[place] EMERGENCY DEPARTMENT AT [facility] Provider Note   CSN: [id number] Arrival date & time: 02/26/23  2336     History  Chief Complaint  Patient presents with   Panic Attack    Jessica [name] is a 72 y.o. female.  HPI    This is a 72 year old female who presents with nausea, vomiting, generalized weakness.  She reports last night she was unable to sleep secondary to vomiting most of the night.  Throughout the day she has felt weak and tremulous.  No diarrhea or change in bowel movements.  No known sick contacts.  She and her husband both endorse that she has had increasing anxiety lately related to not feeling well and easy bruising.  She did have some lab work done by hematology but has not been able to follow-up.  She has had some abnormal labs which have concerned her.  She denies any fevers.  She states "I just do not feel well."  Endorses dizziness. Home Medications Prior to Admission medications   Medication Sig Start Date End Date Taking? Authorizing Provider  ondansetron (ZOFRAN-ODT) 4 MG disintegrating tablet Take 1 tablet (4 mg total) by mouth every 8 (eight) hours as needed. 02/27/23  Yes Jameison Haji, Mayer Masker, MD  amLODipine (NORVASC) 10 MG tablet Take 1 tablet by mouth once daily 10/15/22   Antoine Poche, MD  dicyclomine (BENTYL) 10 MG capsule Take 1 capsule (10 mg total) by mouth 2 (two) times daily before a meal. 12/26/22   Mahon, Frederik Schmidt, NP  fluticasone (FLONASE) 50 MCG/ACT nasal spray Place 1 spray into the nose daily as needed for allergies or rhinitis.    [provider]  gabapentin (NEURONTIN) 300 MG capsule TAKE 1 CAPSULE BY MOUTH THREE TIMES DAILY 02/05/23   Hyatt, Max T, DPM  hydrALAZINE (APRESOLINE) 50 MG tablet Take 50 mg by mouth 2 (two) times daily. 01/10/22   [provider]  HYDROcodone-acetaminophen (NORCO/VICODIN) 5-325 MG tablet Take 1 tablet by mouth at bedtime.    [provider]  levothyroxine (SYNTHROID)  100 MCG tablet Take 100 mcg by mouth daily. 01/09/22   [provider]  loperamide (IMODIUM) 2 MG capsule TAKE 1 CAPSULE BY MOUTH TWICE DAILY AS NEEDED FOR LOOSE STOOLS 12/26/22   Aida Raider, NP  metoprolol succinate (TOPROL-XL) 25 MG 24 hr tablet TAKE 1 & 1/2 (ONE & ONE-HALF) TABLETS BY MOUTH ONCE DAILY 01/06/23   Antoine Poche, MD  Multiple Vitamin (MULTIVITAMIN WITH MINERALS) TABS tablet Take 1 tablet by mouth daily.    [provider]  pantoprazole (PROTONIX) 40 MG tablet Take 1 tablet (40 mg total) by mouth daily before breakfast. 12/26/22   Aida Raider, NP  potassium chloride (KLOR-CON) 10 MEQ tablet Take 10 mEq by mouth 2 (two) times daily. 04/26/21 01/10/23  [provider]  rOPINIRole (REQUIP) 0.5 MG tablet Take 0.5 mg by mouth in the morning and at bedtime. 03/08/21   [provider]  rosuvastatin (CRESTOR) 10 MG tablet Take 1 tablet (10 mg total) by mouth daily. 01/10/23   Sharlene Dory, NP  sertraline (ZOLOFT) 100 MG tablet TAKE 1 & 1/2 (ONE & ONE-HALF) TABLETS BY MOUTH ONCE DAILY 09/30/22   Myrlene Broker, MD  spironolactone (ALDACTONE) 50 MG tablet Take 50 mg by mouth daily. 03/27/22   [provider]      Allergies    Morphine and codeine, Robaxin [methocarbamol], and Sulfa antibiotics    Review of Systems  Review of Systems  Constitutional:  Negative for fever.  Respiratory:  Negative for shortness of breath.   Cardiovascular:  Negative for chest pain.  Gastrointestinal:  Positive for nausea and vomiting. Negative for abdominal pain, constipation and diarrhea.  Neurological:  Positive for dizziness.  All other systems reviewed and are negative.   Physical Exam Updated Vital Signs BP (!) 105/45 (BP Location: Right Arm)   Pulse 71   Temp 97.6 F (36.4 C) (Oral)   Resp 20   Ht 1.575 m (5\' 2" )   Wt 75.8 kg   SpO2 97%   BMI 30.54 kg/m  Physical Exam Vitals and nursing note reviewed.  Constitutional:       Appearance: She is well-developed.     Comments: Tearful, nontoxic appearing  HENT:     Head: Normocephalic and atraumatic.     Mouth/Throat:     Mouth: Mucous membranes are moist.  Eyes:     Pupils: Pupils are equal, round, and reactive to light.  Cardiovascular:     Rate and Rhythm: Normal rate and regular rhythm.     Heart sounds: Normal heart sounds.  Pulmonary:     Effort: Pulmonary effort is normal. No respiratory distress.     Breath sounds: No wheezing.  Abdominal:     Palpations: Abdomen is soft.     Tenderness: There is no abdominal tenderness. There is no guarding or rebound.  Musculoskeletal:     Cervical back: Neck supple.  Skin:    General: Skin is warm and dry.  Neurological:     Mental Status: She is alert and oriented to person, place, and time.  Psychiatric:        Mood and Affect: Mood normal.     ED Results / Procedures / Treatments   Labs (all labs ordered are listed, but only abnormal results are displayed) Labs Reviewed  CBC WITH DIFFERENTIAL/PLATELET - Abnormal; Notable for the following components:      Result Value   RBC 3.19 (*)    Hemoglobin 10.3 (*)    HCT 30.3 (*)    Platelets 108 (*)    All other components within normal limits  COMPREHENSIVE METABOLIC PANEL - Abnormal; Notable for the following components:   Glucose, Bld 117 (*)    Creatinine, Ser 1.56 (*)    Calcium 8.6 (*)    Total Protein 6.2 (*)    GFR, Estimated 36 (*)    All other components within normal limits  LIPASE, BLOOD  URINALYSIS, ROUTINE W REFLEX MICROSCOPIC    EKG EKG Interpretation Date/Time:  Thursday February 27 2023 00:36:51 EDT Ventricular Rate:  73 PR Interval:  190 QRS Duration:  91 QT Interval:  409 QTC Calculation: 451 R Axis:   40  Text Interpretation: Sinus rhythm Low voltage, precordial leads Baseline wander in lead(s) V5 Confirmed by Ross Marcus ([id number]) on 02/27/2023 1:01:20 AM  Radiology No results found.  Procedures Procedures     Medications Ordered in ED Medications  LORazepam (ATIVAN) tablet 1 mg (1 mg Oral Given 02/27/23 0038)  sodium chloride 0.9 % bolus 1,000 mL (1,000 mLs Intravenous New Bag/Given 02/27/23 0119)  ondansetron (ZOFRAN) injection 4 mg (4 mg Intravenous Given 02/27/23 0120)    ED Course/ Medical Decision Making/ A&P                                 Medical Decision Making Amount and/or Complexity of Data  Reviewed Labs: ordered.  Risk Prescription drug management.   This patient presents to the ED for concern of nausea, vomiting, this involves an extensive number of treatment options, and is a complaint that carries with it a high risk of complications and morbidity.  I considered the following differential and admission for this acute, potentially life threatening condition.  The differential diagnosis includes gastritis, gastroenteritis, SBO, anxiety  MDM:    This is a 72 year old female who presents with nausea and vomiting.  She appears fairly anxious on my evaluation.  She is overall otherwise nontoxic and vital signs are reassuring.  Her exam is otherwise benign.  Reports 24-hour history of nausea and vomiting and not feeling well.  She was given fluids and nausea medication.  Labs obtained and reviewed.  Notable for slight AKI with creatinine of 1.56.  No significant metabolic derangements.  Lipase and LFTs are normal.  No significant leukocytosis.  She does have slight thrombocytopenia when compared to prior.  Patient seemed quite anxious and was given Ativan.  On recheck, she states she feels much better and is able to tolerate fluids.  Suspect multifactorial.  I did discuss with the patient that she needs to follow-up with oncology regarding her recent blood work.  She is agreeable to plan.  Do not feel imaging is indicated as her exam is benign.  (Labs, imaging, consults)  Labs: I Ordered, and personally interpreted labs.  The pertinent results include: CBC, CMP, lipase,  urinalysis  Imaging Studies ordered: I ordered imaging studies including none I independently visualized and interpreted imaging. I agree with the radiologist interpretation  Additional history obtained from husband at bedside.  External records from outside source obtained and reviewed including prior evaluations  Cardiac Monitoring: The patient was maintained on a cardiac monitor.  If on the cardiac monitor, I personally viewed and interpreted the cardiac monitored which showed an underlying rhythm of: Sinus rhythm  Reevaluation: After the interventions noted above, I reevaluated the patient and found that they have :improved  Social Determinants of Health:  lives independently  Disposition: Discharge  Co morbidities that complicate the patient evaluation  Past Medical History:  Diagnosis Date   Anxiety attack    Arthritis    Back pain, chronic    Chronic kidney disease    Crohn's colitis (HCC)    Depression (emotion)    Dysrhythmia    GERD (gastroesophageal reflux disease)    Hypertension    Hypothyroid    Neuropathy    Sleep apnea    cpap is not working per patient - needs to find a new doctor - not used since 7/16 per pat    Stage 3 chronic kidney disease (HCC)    Vertigo    followed by Dr Angelena Sole- in [place]      Medicines Meds ordered this encounter  Medications   LORazepam (ATIVAN) tablet 1 mg   sodium chloride 0.9 % bolus 1,000 mL   ondansetron (ZOFRAN) injection 4 mg   ondansetron (ZOFRAN-ODT) 4 MG disintegrating tablet    Sig: Take 1 tablet (4 mg total) by mouth every 8 (eight) hours as needed.    Dispense:  20 tablet    Refill:  0    I have reviewed the patients home medicines and have made adjustments as needed  Problem List / ED Course: Problem List Items Addressed This Visit   None Visit Diagnoses     Nausea and vomiting, unspecified vomiting type    -  Primary  Anxiety       Relevant Medications   LORazepam (ATIVAN) tablet 1 mg (Completed)                    Final Clinical Impression(s) / ED Diagnoses Final diagnoses:  Nausea and vomiting, unspecified vomiting type  Anxiety    Rx / DC Orders ED Discharge Orders          Ordered    ondansetron (ZOFRAN-ODT) 4 MG disintegrating tablet  Every 8 hours PRN        02/27/23 0245              Railey Glad, Mayer Masker, MD 02/27/23 [phone]

## 2023-03-03 ENCOUNTER — Other Ambulatory Visit: Payer: Self-pay | Admitting: Podiatry

## 2023-03-06 ENCOUNTER — Encounter: Payer: Self-pay | Admitting: Gastroenterology

## 2023-03-06 NOTE — Telephone Encounter (Signed)
Left message for pt to return call.

## 2023-03-07 NOTE — Telephone Encounter (Signed)
Pt returned call and has scheduled EGD for 04/01/23 @1 :00pm with Dr.Castaneda. Instructions will be mailed to patient. Pre op letter will be mailed to pt also.

## 2023-03-08 DIAGNOSIS — I10 Essential (primary) hypertension: Secondary | ICD-10-CM | POA: Diagnosis not present

## 2023-03-11 ENCOUNTER — Ambulatory Visit: Payer: Medicare Other | Admitting: Podiatry

## 2023-03-12 ENCOUNTER — Encounter (INDEPENDENT_AMBULATORY_CARE_PROVIDER_SITE_OTHER): Payer: Self-pay

## 2023-03-12 NOTE — Progress Notes (Unsigned)
[facility] [address], [place] [address]   Clinic Day:  03/13/23   Referring physician: Ignatius Specking, MD  Patient Care Team: Ignatius Specking, MD as PCP - General (Internal Medicine) Wyline Mood Dorothe Pea, MD as PCP - Cardiology (Cardiology) Ollen Gross, MD as Consulting Physician (Orthopedic Surgery)   ASSESSMENT & PLAN:   Assessment:  1.  Easy bruising and normocytic anemia: - She reports easy bruising for 5 years, worse lately on the upper extremities.  No nosebleeds or other bleeding reported.  She had right knee surgery 2 years ago and did not have any extra bleeding.  Denies any B symptoms.  She has been on Zoloft for 15 years.  Aspirin 81 mg daily was started 1 year ago.  She does get steroid injections for her back and feet almost every 3 months for a long time. - She denies any bleeding per rectum or melena.  No ice pica. - She has Crohn's disease but is not on any active therapy. - Colonoscopy (06/08/2018): 3 mm fistula at the anastomosis.  Normal ileum.  Internal hemorrhoids.  Mucosal ulceration.  2. Social/Family history: -She lives with husband at home.  She retired after working at [employer].  She also did other factory work.  No tobacco use or chemical exposures.  -No family history of bleeding issues. No family history of cancer. Brother has Crohn's.   Plan:  1.  Easy bruising: -Platelet count has been intermittently low since 2019 ranging from 107-196.  - Labs from 02/12/2023 show a normal platelet count at 157.  PT, PTT and fibrinogen level are all WNL.  Von Willebrand panel is slightly elevated but discussed with Dr. Ellin Saba who reports it is an acute phase reactant and likely secondary to chronic inflammation. There is no evidence of hemolysis or infection. -Would consider PFA-100 testing after she stops aspirin for a week which we will do if platelet count remains low following iron infusion. Re-check labs  in 4-6 weeks.   2.  Normocytic anemia: - Normocytic anemia likely multifactorial in the setting of CKD, iron deficiency and history of Crohn's disease. -Has scheduled colonoscopy on 03/28/2023. -Labs from 02/12/2023 show hemoglobin of 11.6 with a normal differential.  Ferritin level is 31 and iron saturation is 21%.  Folate level 33.5 copper, B12 and MMA all WNL.  No evidence of infiltrative process.  No evidence of M spike.  No evidence of hemolysis. -Discussed replacing iron and rechecking labs in 4 to 6 weeks. -She is scheduled to have colonoscopy later this month.   No orders of the defined types were placed in this encounter.  PLAN SUMMARY: >> Recommend 1 g IV iron depending on insurance. >> Return to clinic in 4 to 6 weeks for follow-up with lab work. >> Consider PFA-100 testing after iron repletion if platelet counts remain low.     I spent 20 minutes dedicated to the care of this patient (face-to-face and non-face-to-face) on the date of the encounter to include what is described in the assessment and plan.   [name], NP   9/5/20241:08 PM  CHIEF COMPLAINT/PURPOSE OF CONSULT:   Diagnosis: Normocytic anemia, easy bruising  Current Therapy: Under workup  HISTORY OF PRESENT ILLNESS:   Jessica [name] is a 72 y.o. female presenting to clinic today for follow-up for anemia and easy bruising.  She was last seen by Dr. Ellin Saba on 02/12/2023.    She presents today with her husband.  Reports that she feels "awful".  Reports her energy levels are 0% and appetite is 40%.  States she could fall asleep anywhere.  The other night at dinner, she fell asleep while eating.  Denies any bright red blood per rectum, melena or hematochezia.  Reports she just wants to feel better.  Symptoms have been present for greater than 1 year per husband.  She also has some joint problems especially her right knee which is making her unstable.  Denies any falls.  Uses a cane for ambulation.  Reports  persistent easy bruising and thin skin.  She is currently on oral iron tablets and tolerating well. .  She is currently on aspirin 81 mg daily secondary to a TIA last year.  She is scheduled for a colonoscopy and EGD on 03/28/2023.   PAST MEDICAL HISTORY:   Past Medical History: Past Medical History:  Diagnosis Date   Anxiety attack    Arthritis    Back pain, chronic    Chronic kidney disease    Crohn's colitis (HCC)    Depression (emotion)    Dysrhythmia    GERD (gastroesophageal reflux disease)    Hypertension    Hypothyroid    Neuropathy    Sleep apnea    cpap is not working per patient - needs to find a new doctor - not used since 7/16 per pat    Stage 3 chronic kidney disease (HCC)    Vertigo    followed by Dr Angelena Sole- in [place]     Surgical History: Past Surgical History:  Procedure Laterality Date   ABDOMINAL HYSTERECTOMY     BIOPSY  06/08/2018   Procedure: BIOPSY;  Surgeon: Malissa Hippo, MD;  [facility]: [facility];  Service: Endoscopy;;  (colon)   CHOLECYSTECTOMY     COLON SURGERY     for Crohn's in the 80s in [place]   COLONOSCOPY N/A 03/11/2013   Procedure: COLONOSCOPY;  Surgeon: Malissa Hippo, MD;  [facility]: [facility];  Service: Endoscopy;  Laterality: N/A;  1200   COLONOSCOPY N/A 06/08/2018   Procedure: COLONOSCOPY;  Surgeon: Malissa Hippo, MD;  [facility]: [facility];  Service: Endoscopy;  Laterality: N/A;  8:25   fatty tumor removed     HARDWARE REMOVAL Right 08/07/2016   Procedure: HARDWARE REMOVAL;  Surgeon: Ollen Gross, MD;  Location: [facility];  Service: Orthopedics;  Laterality: Right;   Hip replacement      Rt hip in 2010 in [place]   KNEE ARTHROSCOPY Left 03/29/2015   Procedure: ARTHROSCOPY LEFT KNEE WITH MENSICAL DEBRIDEMENT, chondroplasty;  Surgeon: Ollen Gross, MD;  Location: [facility];  Service: Orthopedics;  Laterality: Left;   ORIF TIBIA PLATEAU Right 02/29/2016   Procedure: OPEN REDUCTION INTERNAL FIXATION (ORIF RIGHT  TIBIAL  PLATEAU FRACTURE;  Surgeon: Ollen Gross, MD;  Location: [facility];  Service: Orthopedics;  Laterality: Right;   TEE WITHOUT CARDIOVERSION N/A 03/27/2021   Procedure: TRANSESOPHAGEAL ECHOCARDIOGRAM (TEE);  Surgeon: Antoine Poche, MD;  [facility]: [facility];  Service: Endoscopy;  Laterality: N/A;   TOTAL KNEE ARTHROPLASTY Right 08/25/2017   Procedure: TOTAL RIGHT KNEE ARTHROPLASTY;  Surgeon: Ollen Gross, MD;  Location: [facility];  Service: Orthopedics;  Laterality: Right;    Social History: Social History   Socioeconomic History   Marital status: Married    Spouse name: Not on file   Number of children: Not on file   Years of education: Not on file   Highest education level: Not on file  Occupational History   Not on file  Tobacco Use   Smoking status: Never   Smokeless tobacco: Never  Vaping Use   Vaping status: Never Used  Substance and Sexual Activity   Alcohol use: No    Alcohol/week: 0.0 standard drinks of alcohol   Drug use: No   Sexual activity: Not Currently  Other Topics Concern   Not on file  Social History Narrative   Not on file   Social Determinants of Health   Financial Resource Strain: Not on file  Food Insecurity: Not on file  Transportation Needs: Not on file  Physical Activity: Not on file  Stress: Not on file  Social Connections: Not on file  Intimate Partner Violence: Not on file    Family History: Family History  Problem Relation Age of Onset   Crohn's disease Brother    Depression Mother    Anxiety disorder Mother    Depression Sister    Aneurysm Father    Colon cancer Neg Hx     Current Medications:  Current Outpatient Medications:    amLODipine (NORVASC) 10 MG tablet, Take 1 tablet by mouth once daily, Disp: 90 tablet, Rfl: 1   dicyclomine (BENTYL) 10 MG capsule, Take 1 capsule (10 mg total) by mouth 2 (two) times daily before a meal., Disp: 60 capsule, Rfl: 5   ferrous sulfate 325 (65 FE) MG EC tablet, Take 1 tablet (325 mg total)  by mouth daily with breakfast., Disp: 90 tablet, Rfl: 3   fluticasone (FLONASE) 50 MCG/ACT nasal spray, Place 1 spray into the nose daily as needed for allergies or rhinitis., Disp: , Rfl:    gabapentin (NEURONTIN) 300 MG capsule, TAKE 1 CAPSULE BY MOUTH THREE TIMES DAILY, Disp: 90 capsule, Rfl: 0   hydrALAZINE (APRESOLINE) 50 MG tablet, Take 50 mg by mouth 2 (two) times daily., Disp: , Rfl:    HYDROcodone-acetaminophen (NORCO/VICODIN) 5-325 MG tablet, Take 1 tablet by mouth at bedtime., Disp: , Rfl:    levothyroxine (SYNTHROID) 100 MCG tablet, Take 100 mcg by mouth daily., Disp: , Rfl:    loperamide (IMODIUM) 2 MG capsule, TAKE 1 CAPSULE BY MOUTH TWICE DAILY AS NEEDED FOR LOOSE STOOLS, Disp: 180 capsule, Rfl: 1   metoprolol succinate (TOPROL-XL) 25 MG 24 hr tablet, TAKE 1 & 1/2 (ONE & ONE-HALF) TABLETS BY MOUTH ONCE DAILY, Disp: 45 tablet, Rfl: 3   Multiple Vitamin (MULTIVITAMIN WITH MINERALS) TABS tablet, Take 1 tablet by mouth daily., Disp: , Rfl:    ondansetron (ZOFRAN-ODT) 4 MG disintegrating tablet, Take 1 tablet (4 mg total) by mouth every 8 (eight) hours as needed., Disp: 20 tablet, Rfl: 0   pantoprazole (PROTONIX) 40 MG tablet, Take 1 tablet (40 mg total) by mouth daily before breakfast., Disp: 90 tablet, Rfl: 3   rOPINIRole (REQUIP) 0.5 MG tablet, Take 0.5 mg by mouth in the morning and at bedtime., Disp: , Rfl:    rosuvastatin (CRESTOR) 10 MG tablet, Take 1 tablet (10 mg total) by mouth daily., Disp: 90 tablet, Rfl: 2   sertraline (ZOLOFT) 100 MG tablet, TAKE 1 & 1/2 (ONE & ONE-HALF) TABLETS BY MOUTH ONCE DAILY, Disp: 135 tablet, Rfl: 0   spironolactone (ALDACTONE) 50 MG tablet, Take 50 mg by mouth daily., Disp: , Rfl:    potassium chloride (KLOR-CON) 10 MEQ tablet, Take 10 mEq by mouth 2 (two) times daily., Disp: , Rfl:    Allergies: Allergies  Allergen Reactions   Morphine And Codeine Hives and Itching  May cause blood pressure to drop   Robaxin [Methocarbamol] Itching   Sulfa  Antibiotics Itching    ALL SULFA DRUGS    REVIEW OF SYSTEMS:   Review of Systems  Constitutional:  Positive for fatigue.  HENT:   Positive for trouble swallowing.   Respiratory:  Positive for shortness of breath.   Gastrointestinal:  Positive for diarrhea, nausea and vomiting.  Neurological:  Positive for dizziness.  Psychiatric/Behavioral:  Positive for sleep disturbance. The patient is nervous/anxious.      VITALS:   Blood pressure (!) 107/58, pulse 65, temperature 98.2 F (36.8 C), temperature source Oral, resp. rate 18, height 5\' 2"  (1.575 m), weight 158 lb 9.6 oz (71.9 kg), SpO2 99%.  Wt Readings from Last 3 Encounters:  03/13/23 158 lb 9.6 oz (71.9 kg)  02/27/23 167 lb (75.8 kg)  02/12/23 159 lb 11.2 oz (72.4 kg)    Body mass index is 29.01 kg/m.   PHYSICAL EXAM:   Physical Exam Constitutional:      Appearance: Normal appearance.  Cardiovascular:     Rate and Rhythm: Normal rate and regular rhythm.  Pulmonary:     Effort: Pulmonary effort is normal.     Breath sounds: Normal breath sounds.  Abdominal:     General: Bowel sounds are normal.     Palpations: Abdomen is soft.  Musculoskeletal:        General: No swelling. Normal range of motion.  Neurological:     Mental Status: She is alert and oriented to person, place, and time. Mental status is at baseline.     LABS:      Latest Ref Rng & Units 02/27/2023   12:18 AM 02/12/2023   11:11 AM 01/28/2023   11:53 AM  CBC  WBC 4.0 - 10.5 K/uL 5.2  6.8  4.5   Hemoglobin 12.0 - 15.0 g/dL 82.4  23.5  36.1   Hematocrit 36.0 - 46.0 % 30.3  34.9  32.7   Platelets 150 - 400 K/uL 108  157  138       Latest Ref Rng & Units 02/27/2023   12:18 AM 03/27/2022    9:52 AM 03/22/2021    1:32 PM  CMP  Glucose 70 - 99 mg/dL 443  94  98   BUN 8 - 23 mg/dL 18  20  20    Creatinine 0.44 - 1.00 mg/dL 1.54  0.08  6.76   Sodium 135 - 145 mmol/L 136  139  135   Potassium 3.5 - 5.1 mmol/L 3.8  4.3  3.3   Chloride 98 - 111 mmol/L  105  112  98   CO2 22 - 32 mmol/L 22  23  29    Calcium 8.9 - 10.3 mg/dL 8.6  8.7  9.1   Total Protein 6.5 - 8.1 g/dL 6.2     Total Bilirubin 0.3 - 1.2 mg/dL 0.7     Alkaline Phos 38 - 126 U/L 74     AST 15 - 41 U/L 23     ALT 0 - 44 U/L 14        No results found for: "CEA1", "CEA" / No results found for: "CEA1", "CEA" No results found for: "PSA1" No results found for: "PPJ093" No results found for: "CAN125"  Lab Results  Component Value Date   TOTALPROTELP 7.0 02/12/2023   TOTALPROTELP 7.0 02/12/2023   ALBUMINELP 4.0 02/12/2023   A1GS 0.2 02/12/2023   A2GS 0.7 02/12/2023   BETS 1.1 02/12/2023  GAMS 1.0 02/12/2023   MSPIKE Not Observed 02/12/2023   SPEI Comment 02/12/2023   Lab Results  Component Value Date   TIBC 439 02/12/2023   FERRITIN 31 02/12/2023   IRONPCTSAT 21 02/12/2023   Lab Results  Component Value Date   LDH 159 02/12/2023     STUDIES:   ECHOCARDIOGRAM COMPLETE  Result Date: 03/13/2023    ECHOCARDIOGRAM REPORT   Patient Name:   Jessica [name] Date of Exam: 03/13/2023 Medical Rec #:  [id number]      Height:       62.0 in Accession #:    [id number]     Weight:       158.6 lb Date of Birth:  11-14-1950      BSA:          1.732 m Patient Age:    72 years       BP:           124/78 mmHg Patient Gender: F              HR:           64 bpm. 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 Chronic kidney disease,                 Signs/Symptoms:Shortness of Breath and Anemia; Risk                 Factors:Dyslipidemia, Hypertension and Sleep Apnea.  Sonographer:    Dominica Severin RCS, RVS Referring Phys: [phone] [facility]  Sonographer Comments: Global longitudinal strain was attempted. IMPRESSIONS  1. Left ventricular ejection fraction, by estimation, is 60 to  65%. The left ventricle has normal function. The left ventricle has no regional wall motion abnormalities. Left ventricular diastolic parameters are consistent with Grade I diastolic dysfunction (impaired relaxation).  2. Right ventricular systolic function is normal. The right ventricular size is normal. There is normal pulmonary artery systolic pressure.  3. The mitral valve is grossly normal. Trivial mitral valve regurgitation. No evidence of mitral stenosis.  4. The aortic valve has an indeterminant number of cusps. Aortic valve regurgitation is not visualized. No aortic stenosis is present.  5. The inferior vena cava is normal in size with greater than 50% respiratory variability, suggesting right atrial pressure of 3 mmHg. Comparison(s): Changes from prior study are noted. MR is trivial now compared to mild to moderate on the prior Echo. FINDINGS  Left Ventricle: Left ventricular ejection fraction, by estimation, is 60 to 65%. The left ventricle has normal function. The left ventricle has no regional wall motion abnormalities. The left ventricular internal cavity size was normal in size. There is  no left ventricular hypertrophy. Left ventricular diastolic parameters are consistent with Grade I diastolic dysfunction (impaired relaxation). Right Ventricle: The right ventricular size is normal. No increase in right ventricular wall thickness. Right ventricular systolic function is normal. There is normal pulmonary artery systolic pressure. The tricuspid regurgitant velocity is 2.29 m/s, and  with an assumed right atrial pressure of 3 mmHg, the estimated right ventricular systolic pressure is 24.0 mmHg. Left Atrium: Left atrial size was normal in size. Right Atrium: Right atrial size was normal in size. Pericardium:  There is no evidence of pericardial effusion. Mitral Valve: The mitral valve is grossly normal. Trivial mitral valve regurgitation. No evidence of mitral valve stenosis. MV peak gradient, 6.2 mmHg.  The mean mitral valve gradient is 2.0 mmHg. Tricuspid Valve: The tricuspid valve is grossly normal. Tricuspid valve regurgitation is trivial. No evidence of tricuspid stenosis. Aortic Valve: The aortic valve has an indeterminant number of cusps. Aortic valve regurgitation is not visualized. No aortic stenosis is present. Aortic valve mean gradient measures 7.0 mmHg. Aortic valve peak gradient measures 13.8 mmHg. Aortic valve area, by VTI measures 1.68 cm. Pulmonic Valve: The pulmonic valve was not well visualized. Pulmonic valve regurgitation is not visualized. No evidence of pulmonic stenosis. Aorta: The aortic root is normal in size and structure. Venous: The inferior vena cava is normal in size with greater than 50% respiratory variability, suggesting right atrial pressure of 3 mmHg. IAS/Shunts: No atrial level shunt detected by color flow Doppler.  LEFT VENTRICLE PLAX 2D LVIDd:         4.90 cm     Diastology LVIDs:         3.00 cm     LV e' medial:    5.98 cm/s LV PW:         0.90 cm     LV E/e' medial:  15.6 LV IVS:        0.90 cm     LV e' lateral:   7.29 cm/s LVOT diam:     1.90 cm     LV E/e' lateral: 12.8 LV SV:         64 LV SV Index:   37 LVOT Area:     2.84 cm                             3D Volume EF: LV Volumes (MOD)           3D EF:        57 % LV vol d, MOD A2C: 60.0 ml LV EDV:       155 ml LV vol d, MOD A4C: 89.3 ml LV ESV:       66 ml LV vol s, MOD A2C: 14.8 ml LV SV:        89 ml LV vol s, MOD A4C: 26.0 ml LV SV MOD A2C:     45.2 ml LV SV MOD A4C:     89.3 ml LV SV MOD BP:      57.7 ml RIGHT VENTRICLE RV Basal diam:  2.80 cm RV Mid diam:    2.60 cm RV S prime:     11.10 cm/s TAPSE (M-mode): 2.3 cm LEFT ATRIUM             Index        RIGHT ATRIUM          Index LA Vol (A2C):   32.5 ml 18.76 ml/m  RA Area:     9.53 cm LA Vol (A4C):   52.1 ml 30.08 ml/m  RA Volume:   17.40 ml 10.04 ml/m LA Biplane Vol: 42.3 ml 24.42 ml/m  AORTIC VALVE                     PULMONIC VALVE AV Area (Vmax):    1.49  cm      PV Vmax:       1.21 m/s AV Area (Vmean):   1.50 cm  PV Peak grad:  5.9 mmHg AV Area (VTI):     1.68 cm AV Vmax:           186.00 cm/s AV Vmean:          125.000 cm/s AV VTI:            0.380 m AV Peak Grad:      13.8 mmHg AV Mean Grad:      7.0 mmHg LVOT Vmax:         97.90 cm/s LVOT Vmean:        66.200 cm/s LVOT VTI:          0.225 m LVOT/AV VTI ratio: 0.59  AORTA Ao Root diam: 3.00 cm Ao Asc diam:  3.70 cm MITRAL VALVE                TRICUSPID VALVE MV Area (PHT): 2.74 cm     TR Peak grad:   21.0 mmHg MV Area VTI:   1.85 cm     TR Vmax:        229.00 cm/s MV Peak grad:  6.2 mmHg MV Mean grad:  2.0 mmHg     SHUNTS MV Vmax:       1.25 m/s     Systemic VTI:  0.22 m MV Vmean:      72.9 cm/s    Systemic Diam: 1.90 cm MV Decel Time: 277 msec MR Peak grad: 111.5 mmHg MR Mean grad: 71.0 mmHg MR Vmax:      528.00 cm/s MR Vmean:     402.0 cm/s MV E velocity: 93.00 cm/s MV A velocity: 110.00 cm/s MV E/A ratio:  0.85 Vishnu Priya Mallipeddi Electronically signed by Winfield Rast Mallipeddi Signature Date/Time: 03/13/2023/12:32:55 PM    Final

## 2023-03-13 ENCOUNTER — Ambulatory Visit (INDEPENDENT_AMBULATORY_CARE_PROVIDER_SITE_OTHER): Payer: Medicare Other

## 2023-03-13 ENCOUNTER — Telehealth (INDEPENDENT_AMBULATORY_CARE_PROVIDER_SITE_OTHER): Payer: Self-pay | Admitting: Gastroenterology

## 2023-03-13 ENCOUNTER — Inpatient Hospital Stay: Payer: Medicare Other | Attending: Hematology | Admitting: Oncology

## 2023-03-13 VITALS — BP 107/58 | HR 65 | Temp 98.2°F | Resp 18 | Ht 62.0 in | Wt 158.6 lb

## 2023-03-13 DIAGNOSIS — K219 Gastro-esophageal reflux disease without esophagitis: Secondary | ICD-10-CM | POA: Diagnosis not present

## 2023-03-13 DIAGNOSIS — K648 Other hemorrhoids: Secondary | ICD-10-CM | POA: Insufficient documentation

## 2023-03-13 DIAGNOSIS — D649 Anemia, unspecified: Secondary | ICD-10-CM | POA: Insufficient documentation

## 2023-03-13 DIAGNOSIS — Z818 Family history of other mental and behavioral disorders: Secondary | ICD-10-CM | POA: Diagnosis not present

## 2023-03-13 DIAGNOSIS — Z79899 Other long term (current) drug therapy: Secondary | ICD-10-CM | POA: Diagnosis not present

## 2023-03-13 DIAGNOSIS — Z882 Allergy status to sulfonamides status: Secondary | ICD-10-CM | POA: Insufficient documentation

## 2023-03-13 DIAGNOSIS — R233 Spontaneous ecchymoses: Secondary | ICD-10-CM | POA: Insufficient documentation

## 2023-03-13 DIAGNOSIS — Z885 Allergy status to narcotic agent status: Secondary | ICD-10-CM | POA: Insufficient documentation

## 2023-03-13 DIAGNOSIS — D509 Iron deficiency anemia, unspecified: Secondary | ICD-10-CM | POA: Diagnosis not present

## 2023-03-13 DIAGNOSIS — I340 Nonrheumatic mitral (valve) insufficiency: Secondary | ICD-10-CM | POA: Diagnosis not present

## 2023-03-13 DIAGNOSIS — Z79890 Hormone replacement therapy (postmenopausal): Secondary | ICD-10-CM | POA: Insufficient documentation

## 2023-03-13 DIAGNOSIS — Z8379 Family history of other diseases of the digestive system: Secondary | ICD-10-CM | POA: Diagnosis not present

## 2023-03-13 DIAGNOSIS — K5090 Crohn's disease, unspecified, without complications: Secondary | ICD-10-CM | POA: Insufficient documentation

## 2023-03-13 DIAGNOSIS — G4730 Sleep apnea, unspecified: Secondary | ICD-10-CM | POA: Diagnosis not present

## 2023-03-13 DIAGNOSIS — Z90710 Acquired absence of both cervix and uterus: Secondary | ICD-10-CM | POA: Diagnosis not present

## 2023-03-13 DIAGNOSIS — Z9049 Acquired absence of other specified parts of digestive tract: Secondary | ICD-10-CM | POA: Insufficient documentation

## 2023-03-13 DIAGNOSIS — Z8673 Personal history of transient ischemic attack (TIA), and cerebral infarction without residual deficits: Secondary | ICD-10-CM | POA: Insufficient documentation

## 2023-03-13 LAB — ECHOCARDIOGRAM COMPLETE
AR max vel: 1.49 cm2
AV Area VTI: 1.68 cm2
AV Area mean vel: 1.5 cm2
AV Mean grad: 7 mmHg
AV Peak grad: 13.8 mmHg
Ao pk vel: 1.86 m/s
Area-P 1/2: 2.74 cm2
Calc EF: 73.7 %
Height: 62 in
MV M vel: 5.28 m/s
MV Peak grad: 111.5 mmHg
MV VTI: 1.85 cm2
S' Lateral: 3 cm
Single Plane A2C EF: 75.3 %
Single Plane A4C EF: 70.9 %
Weight: 2537.6 [oz_av]

## 2023-03-13 MED ORDER — FERROUS SULFATE 325 (65 FE) MG PO TBEC: 325.0000 mg | DELAYED_RELEASE_TABLET | Freq: Every day | ORAL | 3 refills | Status: DC

## 2023-03-13 MED ORDER — PEG 3350-KCL-NA BICARB-NACL 420 G PO SOLR: 4000.0000 mL | Freq: Once | ORAL | 0 refills | Status: AC

## 2023-03-13 NOTE — Telephone Encounter (Signed)
Going through older encounters to get caught up and noticed that pt was on for just EGD and needed TCS. Contacted pt and pt is ok with adding TCS to procedure on 04/01/23. Will send prep and mail instructions. Pre op included in instructions.

## 2023-03-14 ENCOUNTER — Inpatient Hospital Stay: Payer: Medicare Other

## 2023-03-14 VITALS — BP 98/48 | HR 60 | Temp 98.2°F | Resp 18

## 2023-03-14 DIAGNOSIS — K648 Other hemorrhoids: Secondary | ICD-10-CM | POA: Diagnosis not present

## 2023-03-14 DIAGNOSIS — K219 Gastro-esophageal reflux disease without esophagitis: Secondary | ICD-10-CM | POA: Diagnosis not present

## 2023-03-14 DIAGNOSIS — D509 Iron deficiency anemia, unspecified: Secondary | ICD-10-CM

## 2023-03-14 DIAGNOSIS — D649 Anemia, unspecified: Secondary | ICD-10-CM | POA: Diagnosis not present

## 2023-03-14 DIAGNOSIS — R233 Spontaneous ecchymoses: Secondary | ICD-10-CM | POA: Diagnosis not present

## 2023-03-14 DIAGNOSIS — G4730 Sleep apnea, unspecified: Secondary | ICD-10-CM | POA: Diagnosis not present

## 2023-03-14 DIAGNOSIS — K5090 Crohn's disease, unspecified, without complications: Secondary | ICD-10-CM | POA: Diagnosis not present

## 2023-03-14 MED ORDER — SODIUM CHLORIDE 0.9 % IV SOLN
1000.0000 mg | Freq: Once | INTRAVENOUS | Status: AC
  Administered 2023-03-14: 1000 mg via INTRAVENOUS
  Filled 2023-03-14: qty 1000

## 2023-03-14 MED ORDER — ACETAMINOPHEN 325 MG PO TABS
650.0000 mg | ORAL_TABLET | Freq: Once | ORAL | Status: AC
  Administered 2023-03-14: 650 mg via ORAL
  Filled 2023-03-14: qty 2

## 2023-03-14 MED ORDER — CETIRIZINE HCL 10 MG/ML IV SOLN
10.0000 mg | Freq: Once | INTRAVENOUS | Status: AC
  Administered 2023-03-14: 10 mg via INTRAVENOUS
  Filled 2023-03-14: qty 1

## 2023-03-14 MED ORDER — SODIUM CHLORIDE 0.9 % IV SOLN: Freq: Once | INTRAVENOUS | Status: AC

## 2023-03-14 NOTE — Progress Notes (Signed)
Patient tolerated iron infusion with no complaints voiced.  Peripheral IV site clean and dry with good blood return noted before and after infusion.  Band aid applied.  VSS with discharge and left in satisfactory condition with no s/s of distress noted.   

## 2023-03-14 NOTE — Patient Instructions (Signed)

## 2023-03-18 ENCOUNTER — Emergency Department (HOSPITAL_COMMUNITY)
Admission: EM | Admit: 2023-03-18 | Discharge: 2023-03-19 | Disposition: A | Discharge status: Home / Self Care | Payer: Medicare Other | Attending: Emergency Medicine | Admitting: Emergency Medicine

## 2023-03-18 ENCOUNTER — Encounter (HOSPITAL_COMMUNITY): Payer: Self-pay | Admitting: Emergency Medicine

## 2023-03-18 ENCOUNTER — Emergency Department (HOSPITAL_COMMUNITY): Payer: Medicare Other

## 2023-03-18 ENCOUNTER — Ambulatory Visit (HOSPITAL_BASED_OUTPATIENT_CLINIC_OR_DEPARTMENT_OTHER): Payer: Medicare Other | Admitting: Pulmonary Disease

## 2023-03-18 ENCOUNTER — Other Ambulatory Visit: Payer: Self-pay

## 2023-03-18 ENCOUNTER — Telehealth: Payer: Self-pay | Admitting: Cardiology

## 2023-03-18 ENCOUNTER — Telehealth: Payer: Self-pay | Admitting: *Deleted

## 2023-03-18 ENCOUNTER — Encounter (HOSPITAL_BASED_OUTPATIENT_CLINIC_OR_DEPARTMENT_OTHER): Payer: Self-pay | Admitting: Pulmonary Disease

## 2023-03-18 ENCOUNTER — Encounter: Payer: Self-pay | Admitting: *Deleted

## 2023-03-18 VITALS — BP 110/52 | HR 76 | Resp 18 | Ht 62.0 in | Wt 160.3 lb

## 2023-03-18 DIAGNOSIS — R112 Nausea with vomiting, unspecified: Secondary | ICD-10-CM | POA: Insufficient documentation

## 2023-03-18 DIAGNOSIS — R079 Chest pain, unspecified: Secondary | ICD-10-CM | POA: Insufficient documentation

## 2023-03-18 DIAGNOSIS — R161 Splenomegaly, not elsewhere classified: Secondary | ICD-10-CM | POA: Diagnosis not present

## 2023-03-18 DIAGNOSIS — I517 Cardiomegaly: Secondary | ICD-10-CM | POA: Diagnosis not present

## 2023-03-18 DIAGNOSIS — R531 Weakness: Secondary | ICD-10-CM | POA: Diagnosis not present

## 2023-03-18 DIAGNOSIS — G4733 Obstructive sleep apnea (adult) (pediatric): Secondary | ICD-10-CM

## 2023-03-18 DIAGNOSIS — R1084 Generalized abdominal pain: Secondary | ICD-10-CM | POA: Diagnosis not present

## 2023-03-18 DIAGNOSIS — K838 Other specified diseases of biliary tract: Secondary | ICD-10-CM | POA: Diagnosis not present

## 2023-03-18 DIAGNOSIS — K7460 Unspecified cirrhosis of liver: Secondary | ICD-10-CM | POA: Diagnosis not present

## 2023-03-18 DIAGNOSIS — Z96641 Presence of right artificial hip joint: Secondary | ICD-10-CM | POA: Diagnosis not present

## 2023-03-18 DIAGNOSIS — I878 Other specified disorders of veins: Secondary | ICD-10-CM | POA: Diagnosis not present

## 2023-03-18 DIAGNOSIS — K7689 Other specified diseases of liver: Secondary | ICD-10-CM | POA: Diagnosis not present

## 2023-03-18 LAB — CBC WITH DIFFERENTIAL/PLATELET
Abs Immature Granulocytes: 0.03 10*3/uL (ref 0.00–0.07)
Basophils Absolute: 0 10*3/uL (ref 0.0–0.1)
Basophils Relative: 0 %
Eosinophils Absolute: 0.2 10*3/uL (ref 0.0–0.5)
Eosinophils Relative: 3 %
HCT: 32.6 % — ABNORMAL LOW (ref 36.0–46.0)
Hemoglobin: 11 g/dL — ABNORMAL LOW (ref 12.0–15.0)
Immature Granulocytes: 0 %
Lymphocytes Relative: 10 %
Lymphs Abs: 0.8 10*3/uL (ref 0.7–4.0)
MCH: 32.3 pg (ref 26.0–34.0)
MCHC: 33.7 g/dL (ref 30.0–36.0)
MCV: 95.6 fL (ref 80.0–100.0)
Monocytes Absolute: 0.5 10*3/uL (ref 0.1–1.0)
Monocytes Relative: 7 %
Neutro Abs: 6.1 10*3/uL (ref 1.7–7.7)
Neutrophils Relative %: 80 %
Platelets: 123 10*3/uL — ABNORMAL LOW (ref 150–400)
RBC: 3.41 MIL/uL — ABNORMAL LOW (ref 3.87–5.11)
RDW: 12.3 % (ref 11.5–15.5)
WBC: 7.7 10*3/uL (ref 4.0–10.5)
nRBC: 0 % (ref 0.0–0.2)

## 2023-03-18 LAB — COMPREHENSIVE METABOLIC PANEL WITH GFR
ALT: 18 U/L (ref 0–44)
AST: 31 U/L (ref 15–41)
Albumin: 4.1 g/dL (ref 3.5–5.0)
Alkaline Phosphatase: 68 U/L (ref 38–126)
Anion gap: 10 (ref 5–15)
BUN: 21 mg/dL (ref 8–23)
CO2: 17 mmol/L — ABNORMAL LOW (ref 22–32)
Calcium: 9.5 mg/dL (ref 8.9–10.3)
Chloride: 106 mmol/L (ref 98–111)
Creatinine, Ser: 1.48 mg/dL — ABNORMAL HIGH (ref 0.44–1.00)
GFR, Estimated: 38 mL/min — ABNORMAL LOW
Glucose, Bld: 88 mg/dL (ref 70–99)
Potassium: 4.3 mmol/L (ref 3.5–5.1)
Sodium: 133 mmol/L — ABNORMAL LOW (ref 135–145)
Total Bilirubin: 0.9 mg/dL (ref 0.3–1.2)
Total Protein: 6.8 g/dL (ref 6.5–8.1)

## 2023-03-18 LAB — TYPE AND SCREEN
ABO/RH(D): O POS
Antibody Screen: NEGATIVE

## 2023-03-18 LAB — TROPONIN I (HIGH SENSITIVITY): Troponin I (High Sensitivity): 14 ng/L (ref <18)

## 2023-03-18 MED ORDER — HYDROCODONE-ACETAMINOPHEN 5-325 MG PO TABS
1.0000 | ORAL_TABLET | Freq: Once | ORAL | Status: AC
  Administered 2023-03-19: 1 via ORAL
  Filled 2023-03-18: qty 1

## 2023-03-18 MED ORDER — ONDANSETRON 4 MG PO TBDP
4.0000 mg | ORAL_TABLET | Freq: Once | ORAL | Status: AC
  Administered 2023-03-18: 4 mg via ORAL
  Filled 2023-03-18: qty 1

## 2023-03-18 MED ORDER — LACTATED RINGERS IV BOLUS
1000.0000 mL | Freq: Once | INTRAVENOUS | Status: AC
  Administered 2023-03-18: 1000 mL via INTRAVENOUS

## 2023-03-18 MED ORDER — HYDROCODONE-ACETAMINOPHEN 5-325 MG PO TABS
1.0000 | ORAL_TABLET | Freq: Once | ORAL | Status: AC
  Administered 2023-03-18: 1 via ORAL
  Filled 2023-03-18: qty 1

## 2023-03-18 MED ORDER — METOCLOPRAMIDE HCL 5 MG/ML IJ SOLN
10.0000 mg | Freq: Once | INTRAMUSCULAR | Status: AC
  Administered 2023-03-18: 10 mg via INTRAVENOUS
  Filled 2023-03-18: qty 2

## 2023-03-18 MED ORDER — IOHEXOL 300 MG/ML  SOLN
75.0000 mL | Freq: Once | INTRAMUSCULAR | Status: AC | PRN
  Administered 2023-03-18: 75 mL via INTRAVENOUS

## 2023-03-18 MED ORDER — ROPINIROLE HCL 1 MG PO TABS
1.0000 mg | ORAL_TABLET | Freq: Once | ORAL | Status: AC
  Administered 2023-03-18: 1 mg via ORAL
  Filled 2023-03-18: qty 1

## 2023-03-18 NOTE — Telephone Encounter (Signed)
Follow Up:         Patient would like to have Echo results called and explained to her please.

## 2023-03-18 NOTE — Telephone Encounter (Signed)
Notified patient results still on provider desk top awaiting his review.

## 2023-03-18 NOTE — Discharge Instructions (Signed)
Your CT scan questions that you might have a little bit of enlarged spleen and early signs of cirrhosis on your liver.  Follow-up with the gastroenterology specialist, Dr. Levon Hedger.  If you develop worsening, continued, or recurrent abdominal pain, uncontrolled vomiting, fever, chest or back pain, or any other new/concerning symptoms then return to the ER for evaluation.

## 2023-03-18 NOTE — ED Notes (Signed)
Pt ambulated to the bathroom. Doctor is at bedside.

## 2023-03-18 NOTE — ED Notes (Signed)
Patient transported to CT 

## 2023-03-18 NOTE — ED Triage Notes (Addendum)
Pt complains of feeling weakness and vomiting for months. Had iron infusion a few days ago. Feels SOB, no energy, feel like everything she eats gets stuck in throat. Pt also complains of left knee pain x 3 months.

## 2023-03-18 NOTE — Telephone Encounter (Signed)
Patient called to advise that she has been vomiting for several days and cannot keep anything down, associated with difficulty swallowing and profound shortness of breath and weakness.  Advised to seek medical attention in the ER.  Verbalized understanding.

## 2023-03-18 NOTE — ED Notes (Signed)
Patient restless in bed; reports her legs are hurting and asking for medication. MD notified

## 2023-03-18 NOTE — ED Provider Notes (Signed)
[place] EMERGENCY DEPARTMENT AT [facility] Provider Note   CSN: [id number] Arrival date & time: 03/18/23  1656     History  Chief Complaint  Patient presents with   Chest Pain   Weakness    Jessica [name] is a 72 y.o. female.  HPI 72 year old female presents with a chief complaint of vomiting and weakness.  Patient has been having progressive symptoms for months.  She feels diffusely fatigued and has been told her iron levels are low.  She is due to see GI to get a workup for anemia.  She has been getting iron infusions.  She has had vomiting for about a month or 2 though has not noticed any blood.  No blood in her stools.  She has abdominal pain but states this is chronic from her Crohn's.  She has chronic diarrhea.  Denies any fevers, cough.  She has been short of breath, especially with exertion but this is chronic.  Food seems to get stuck in her chest causing chronic chest pain that is unchanged.  Feels like there is something stuck in her chest right now.  Called the cancer center who has been doing her iron infusions and they told her to come get evaluated.  Home Medications Prior to Admission medications   Medication Sig Start Date End Date Taking? Authorizing Provider  amLODipine (NORVASC) 10 MG tablet Take 1 tablet by mouth once daily 10/15/22  Yes Branch, Dorothe Pea, MD  dicyclomine (BENTYL) 10 MG capsule Take 1 capsule (10 mg total) by mouth 2 (two) times daily before a meal. 12/26/22  Yes Aida Raider, NP  ferrous sulfate 325 (65 FE) MG EC tablet Take 1 tablet (325 mg total) by mouth daily with breakfast. 03/13/23  Yes Burns, Renda Rolls, NP  gabapentin (NEURONTIN) 300 MG capsule TAKE 1 CAPSULE BY MOUTH THREE TIMES DAILY 03/03/23  Yes Hyatt, Max T, DPM  hydrALAZINE (APRESOLINE) 50 MG tablet Take 50 mg by mouth 2 (two) times daily. 01/10/22  Yes [provider]  HYDROcodone-acetaminophen (NORCO/VICODIN) 5-325 MG tablet Take 1 tablet by mouth at bedtime.    Yes [provider]  levothyroxine (SYNTHROID) 100 MCG tablet Take 100 mcg by mouth daily. 01/09/22  Yes [provider]  loperamide (IMODIUM) 2 MG capsule TAKE 1 CAPSULE BY MOUTH TWICE DAILY AS NEEDED FOR LOOSE STOOLS 12/26/22  Yes Mahon, Courtney L, NP  metoprolol succinate (TOPROL-XL) 25 MG 24 hr tablet TAKE 1 & 1/2 (ONE & ONE-HALF) TABLETS BY MOUTH ONCE DAILY 01/06/23  Yes Branch, Dorothe Pea, MD  Multiple Vitamin (MULTIVITAMIN WITH MINERALS) TABS tablet Take 1 tablet by mouth daily.   Yes [provider]  ondansetron (ZOFRAN-ODT) 4 MG disintegrating tablet Take 1 tablet (4 mg total) by mouth every 8 (eight) hours as needed. 02/27/23  Yes Horton, Mayer Masker, MD  pantoprazole (PROTONIX) 40 MG tablet Take 1 tablet (40 mg total) by mouth daily before breakfast. 12/26/22  Yes Mahon, Courtney L, NP  potassium chloride (KLOR-CON) 10 MEQ tablet Take 10 mEq by mouth daily. 04/26/21 03/18/23 Yes [provider]  rOPINIRole (REQUIP) 0.5 MG tablet Take 0.5-1 mg by mouth in the morning and at bedtime. One in the morning, and two at night 03/08/21  Yes [provider]  rosuvastatin (CRESTOR) 10 MG tablet Take 1 tablet (10 mg total) by mouth daily. 01/10/23  Yes Sharlene Dory, NP  sertraline (ZOLOFT) 100 MG tablet TAKE 1 & 1/2 (ONE & ONE-HALF) TABLETS BY MOUTH  ONCE DAILY 09/30/22  Yes Myrlene Broker, MD  spironolactone (ALDACTONE) 50 MG tablet Take 50 mg by mouth daily. 03/27/22  Yes [provider]  rOPINIRole (REQUIP) 1 MG tablet Take 1 mg by mouth at bedtime. 03/18/23   [provider]      Allergies    Morphine and codeine, Robaxin [methocarbamol], and Sulfa antibiotics    Review of Systems   Review of Systems  Constitutional:  Positive for fatigue. Negative for fever.  Respiratory:  Positive for shortness of breath. Negative for cough.   Cardiovascular:  Positive for chest pain.  Gastrointestinal:  Positive for abdominal pain, diarrhea, nausea and  vomiting.  Genitourinary:  Negative for dysuria.    Physical Exam Updated Vital Signs BP 126/60 (BP Location: Left Arm)   Pulse 83   Temp 98.6 F (37 C) (Oral)   Resp 19   Ht 5\' 2"  (1.575 m)   Wt 72 kg   SpO2 95%   BMI 29.03 kg/m  Physical Exam Vitals and nursing note reviewed.  Constitutional:      General: She is not in acute distress.    Appearance: She is well-developed. She is not ill-appearing or diaphoretic.  HENT:     Head: Normocephalic and atraumatic.  Cardiovascular:     Rate and Rhythm: Normal rate and regular rhythm.     Heart sounds: Normal heart sounds.  Pulmonary:     Effort: Pulmonary effort is normal.     Breath sounds: Normal breath sounds.  Abdominal:     Palpations: Abdomen is soft.     Tenderness: There is generalized abdominal tenderness (mild).  Skin:    General: Skin is warm and dry.  Neurological:     Mental Status: She is alert.     ED Results / Procedures / Treatments   Labs (all labs ordered are listed, but only abnormal results are displayed) Labs Reviewed  CBC WITH DIFFERENTIAL/PLATELET - Abnormal; Notable for the following components:      Result Value   RBC 3.41 (*)    Hemoglobin 11.0 (*)    HCT 32.6 (*)    Platelets 123 (*)    All other components within normal limits  COMPREHENSIVE METABOLIC PANEL - Abnormal; Notable for the following components:   Sodium 133 (*)    CO2 17 (*)    Creatinine, Ser 1.48 (*)    GFR, Estimated 38 (*)    All other components within normal limits  TYPE AND SCREEN  TROPONIN I (HIGH SENSITIVITY)    EKG EKG Interpretation Date/Time:  Tuesday March 18 2023 20:54:38 EDT Ventricular Rate:  77 PR Interval:  186 QRS Duration:  89 QT Interval:  370 QTC Calculation: 419 R Axis:   100  Text Interpretation: Sinus rhythm Right axis deviation similar to earlier in the day Confirmed by Pricilla Loveless [phone]) on 03/18/2023 9:22:50 PM  Radiology CT ABDOMEN PELVIS W CONTRAST  Result Date:  03/18/2023 CLINICAL DATA:  Chronic weakness, vomiting, recent iron infusion EXAM: CT ABDOMEN AND PELVIS WITH CONTRAST TECHNIQUE: Multidetector CT imaging of the abdomen and pelvis was performed using the standard protocol following bolus administration of intravenous contrast. RADIATION DOSE REDUCTION: This exam was performed according to the departmental dose-optimization program which includes automated exposure control, adjustment of the mA and/or kV according to patient size and/or use of iterative reconstruction technique. CONTRAST:  75mL OMNIPAQUE IOHEXOL 300 MG/ML  SOLN COMPARISON:  05/10/2019 FINDINGS: Lower chest: Mild lingular scarring/atelectasis. Hepatobiliary: Mildly nodular hepatic contour, favoring  early cirrhosis. Riedel's lobe configuration. Calcified hepatic granulomata. Status post cholecystectomy. No intrahepatic ductal dilatation. Mildly dilated common duct, measuring 15 mm, smoothly tapering at the ampulla and favored to be postsurgical. Pancreas: Within normal limits. Spleen: Borderline enlarged, measuring 13.9 cm in maximal craniocaudal dimension. Splenule in the left upper abdomen adjacent to the left kidney (series 2/image 26). Adrenals/Urinary Tract: Adrenal glands are within normal limits. Bilateral simple renal cysts, measuring up to 1.8 cm in the posterior right upper kidney (series 7/image 8), benign (Bosniak I). No follow-up is recommended. Bladder is underdistended and partially obscured by streak artifact. Stomach/Bowel: Stomach is notable for a small hiatal hernia. Status post right hemicolectomy with appendectomy. No evidence of bowel obstruction. No colonic wall thickening or inflammatory changes. Vascular/Lymphatic: No evidence of abdominal aortic aneurysm. Atherosclerotic calcifications of the abdominal aorta and branch vessels, although vessels remain patent. Portal vein is patent. Small upper abdominal and retroperitoneal lymph nodes, likely reactive. Reproductive: Status  post hysterectomy. Bilateral ovaries are within normal limits. Other: No abdominopelvic ascites. Musculoskeletal: Mild degenerative changes of the visualized thoracolumbar spine. Right hip arthroplasty. IMPRESSION: Status post right hemicolectomy with appendectomy. No evidence of bowel obstruction. Suspected early cirrhosis. Borderline splenomegaly. No abdominopelvic ascites. Additional ancillary findings as above. Electronically Signed   By: Charline Bills M.D.   On: 03/18/2023 23:40   DG Neck Soft Tissue  Result Date: 03/18/2023 CLINICAL DATA:  Chest pain weakness, question food in throat EXAM: NECK SOFT TISSUES - 1+ VIEW COMPARISON:  None Available. FINDINGS: Nondiagnostic lateral view neck soft tissues due to motion. Mild degenerative changes of the spine. IMPRESSION: Nondiagnostic lateral view neck soft tissues due to motion. Electronically Signed   By: Jasmine Pang M.D.   On: 03/18/2023 20:04   DG Abdomen 1 View  Result Date: 03/18/2023 CLINICAL DATA:  Nausea vomiting EXAM: ABDOMEN - 1 VIEW COMPARISON:  03/17/2009 FINDINGS: Postsurgical changes in the right mid quadrant. Few air-filled mildly dilated small bowel in the left mid quadrant with scattered colon gas. Phleboliths in the pelvis. Right hip replacement IMPRESSION: Few air-filled mildly dilated small bowel loops in the left mid quadrant with scattered colon gas, overall nonspecific nonobstructed gas pattern. Electronically Signed   By: Jasmine Pang M.D.   On: 03/18/2023 20:03   DG Chest 2 View  Result Date: 03/18/2023 CLINICAL DATA:  Nausea vomiting chest pain EXAM: CHEST - 2 VIEW COMPARISON:  11/15/2020 FINDINGS: Calcified left lower lung granuloma. Borderline cardiac enlargement. No acute airspace disease, pleural effusion or pneumothorax. IMPRESSION: No active cardiopulmonary disease. Electronically Signed   By: Jasmine Pang M.D.   On: 03/18/2023 20:01    Procedures Procedures    Medications Ordered in ED Medications   HYDROcodone-acetaminophen (NORCO/VICODIN) 5-325 MG per tablet 1 tablet (has no administration in time range)  ondansetron (ZOFRAN-ODT) disintegrating tablet 4 mg (4 mg Oral Given 03/18/23 1830)  HYDROcodone-acetaminophen (NORCO/VICODIN) 5-325 MG per tablet 1 tablet (1 tablet Oral Given 03/18/23 1830)  lactated ringers bolus 1,000 mL (1,000 mLs Intravenous New Bag/Given 03/18/23 2138)  metoCLOPramide (REGLAN) injection 10 mg (10 mg Intravenous Given 03/18/23 2138)  rOPINIRole (REQUIP) tablet 1 mg (1 mg Oral Given 03/18/23 2214)  iohexol (OMNIPAQUE) 300 MG/ML solution 75 mL (75 mLs Intravenous Contrast Given 03/18/23 2258)    ED Course/ Medical Decision Making/ A&P                                 Medical  Decision Making Amount and/or Complexity of Data Reviewed Labs: ordered.    Details: Chronic but improving anemia.  CKD seems to be baseline.  Troponin not consistent with ACS. Radiology: ordered and independent interpretation performed.    Details: No bowel obstruction. ECG/medicine tests: ordered and independent interpretation performed.    Details: No ischemia  Risk Prescription drug management.   Patient was given some IV fluids.  She has been having symptoms for several months.  No emergent findings on workup today.  She is feeling better with some treatments including Reglan and fluids.  She was also given Requip for her chronic restless legs and is also complaining of chronic pain in her legs and was given her baseline hydrocodone.  CT did show some possible early cirrhosis and she will need to continue to follow-up with GI.  Otherwise, no signs of bleeding or other emergent condition and appears stable for discharge.        Final Clinical Impression(s) / ED Diagnoses Final diagnoses:  Generalized weakness    Rx / DC Orders ED Discharge Orders     None         Pricilla Loveless, MD 03/18/23 2349

## 2023-03-18 NOTE — Patient Instructions (Signed)
Flu shot  BiPAP is working better

## 2023-03-18 NOTE — ED Notes (Signed)
Pt ambulated to the bathroom. Pt is now resting with call light in reach.

## 2023-03-18 NOTE — ED Provider Triage Note (Signed)
Emergency Medicine Provider Triage Evaluation Note  Jessica [name] , a 72 y.o. female  was evaluated in triage.  Pt complains of intractable nausea, vomiting, this been going on for few days.  She states she just feels very weak and tired, and she got a iron infusion a couple days ago and she still feels bad.  Reports generalized weakness, and shortness of breath that is getting worse.  Denies any chest pain. Denies any abdominal pain.  Review of Systems  Positive: Vomiting Negative: Chest pain, abd pain  Physical Exam  BP 115/60 (BP Location: Left Arm)   Pulse (!) 103   Temp 99.6 F (37.6 C) (Oral)   Resp 18   Wt 72 kg   SpO2 98%   BMI 29.03 kg/m  Gen:   Awake, no distress   Resp:  Normal effort  MSK:   Moves extremities without difficulty  Other:    Medical Decision Making  Medically screening exam initiated at 5:42 PM.  Appropriate orders placed.  Jessica [name] was informed that the remainder of the evaluation will be completed by another provider, this initial triage assessment does not replace that evaluation, and the importance of remaining in the ED until their evaluation is complete.     Pete Pelt, [place] 03/18/23 1743

## 2023-03-18 NOTE — Progress Notes (Signed)
   Subjective:    Patient ID: Jessica [name], female    DOB: July 19, 1950, 72 y.o.   MRN: [id number]  HPI  72 yo for follow-up of OSA and dyspnea   PMH -Crohn's disease, hypertension, hyperlipidemia, CKD stage III, moderate MR -schwannoma RT ear  70month FU visit Last OV >> auto bilevel .  She is tolerating this better than CPAP.  She also likes the new mask she has.  She complains of drying of her air passages.  Has been increased her humidity but that she had condensation within the tubing. She reports that pressure may be too high. She was evaluated by hematology for easy bruising and found to have mild thrombocytopenia and iron deficiency.  She is getting iron infusion, colonoscopy is planned and perhaps a bone marrow biopsy.    Significant tests/ events reviewed   10/2022 CPAP titration >> BiPAP 25/21 01/2021 HST showed mod  OSA with AHI 28/ hr 03/2021 TEE mod MR  Review of Systems neg for any significant sore throat, dysphagia, itching, sneezing, nasal congestion or excess/ purulent secretions, fever, chills, sweats, unintended wt loss, pleuritic or exertional cp, hempoptysis, orthopnea pnd or change in chronic leg swelling. Also denies presyncope, palpitations, heartburn, abdominal pain, nausea, vomiting, diarrhea or change in bowel or urinary habits, dysuria,hematuria, rash, arthralgias, visual complaints, headache, numbness weakness or ataxia.     Objective:   Physical Exam  Gen. Pleasant, well-nourished, in no distress ENT - no thrush, no pallor/icterus,no post nasal drip Neck: No JVD, no thyromegaly, no carotid bruits Lungs: no use of accessory muscles, no dullness to percussion, clear without rales or rhonchi  Cardiovascular: Rhythm regular, heart sounds  normal, no murmurs or gallops, no peripheral edema Musculoskeletal: No deformities, no cyanosis or clubbing  Skin - easy bruising       Assessment & Plan:

## 2023-03-18 NOTE — Assessment & Plan Note (Signed)
BiPAP download was reviewed which shows residual AHI of 19 with predominant central apneas 15.  Compliance is better about 5.5 hours per night on average leak is better controlled. We discussed strategies for using a CPAP hose holder.  She will try to be consistent with CPAP usage and we will reassess residual events in 3 months.  With these her treatment emergent central apneas and I am hopeful that they may subside within that timeframe.  Average pressure is 15/10 cm We have discussed hypoglossal nerve stimulation therapy in the past but we will put that on hold for now  Weight loss encouraged, compliance with goal of at least 4-6 hrs every night is the expectation. Advised against medications with sedative side effects Cautioned against driving when sleepy - understanding that sleepiness will vary on a day to day basis

## 2023-03-19 ENCOUNTER — Telehealth (INDEPENDENT_AMBULATORY_CARE_PROVIDER_SITE_OTHER): Payer: Medicare Other | Admitting: Psychiatry

## 2023-03-19 ENCOUNTER — Encounter (HOSPITAL_COMMUNITY): Payer: Self-pay | Admitting: Psychiatry

## 2023-03-19 DIAGNOSIS — R531 Weakness: Secondary | ICD-10-CM | POA: Diagnosis not present

## 2023-03-19 DIAGNOSIS — F331 Major depressive disorder, recurrent, moderate: Secondary | ICD-10-CM

## 2023-03-19 MED ORDER — SERTRALINE HCL 100 MG PO TABS
100.0000 mg | ORAL_TABLET | Freq: Every day | ORAL | 0 refills | Status: DC
Start: 2023-03-19 — End: 2023-04-24

## 2023-03-19 MED ORDER — BUPROPION HCL ER (XL) 150 MG PO TB24: 150.0000 mg | ORAL_TABLET | ORAL | 2 refills | Status: DC

## 2023-03-19 NOTE — Progress Notes (Signed)
Virtual Visit via Video Note  I connected with Jessica [name] on 03/19/23 at  1:00 PM EDT by a video enabled telemedicine application and verified that I am speaking with the correct person using two identifiers.  Location: Patient: home Provider: office   I discussed the limitations of evaluation and management by telemedicine and the availability of in person appointments. The patient expressed understanding and agreed to proceed.     I discussed the assessment and treatment plan with the patient. The patient was provided an opportunity to ask questions and all were answered. The patient agreed with the plan and demonstrated an understanding of the instructions.   The patient was advised to call back or seek an in-person evaluation if the symptoms worsen or if the condition fails to improve as anticipated.  I provided 20 minutes of non-face-to-face time during this encounter.   Diannia Ruder, MD  [facility] MD/PA/NP OP Progress Note  03/19/2023 1:20 PM Jessica [name]  MRN:  [id number]  Chief Complaint:  Chief Complaint  Patient presents with   Anxiety   Depression   Follow-up   HPI: This patient is a 72 year old married white female lives with her husband in [place].  She has 2 grown sons.  She retired as a [employer] fast streamer.  The patient returns for follow-up after about 6 months.  This is regarding her depression and anxiety.  Since I last saw her she states her health has not been good.  Her hemoglobin and hematocrit and platelets are low.  She is tired all the time.  She has gotten 1 iron infusion and her blood indices came up a little bit but not much.  She is supposed to have endoscopy and colonoscopy coming up in a few days.  She has had a complete workup from hematology/oncology and nothing really want to show note.  They are concerned that the Zoloft may be causing some bleeding and easy bruisability.  I told her that all the SSRIs and SNRIs can cause this and perhaps we should  switch her to another medicine such as Wellbutrin.  Since she is on such a high dose of Zoloft we will have to do this gradually.  She is in agreement.  She is not significantly depressed but just tired and nauseous.  She has lost 10 pounds over the last several months. Visit Diagnosis:    ICD-10-CM   1. Major depressive disorder, recurrent episode, moderate (HCC)  F33.1 sertraline (ZOLOFT) 100 MG tablet      Past Psychiatric History: Previous outpatient treatment  Past Medical History:  Past Medical History:  Diagnosis Date   Anxiety attack    Arthritis    Back pain, chronic    Chronic kidney disease    Crohn's colitis (HCC)    Depression (emotion)    Dysrhythmia    GERD (gastroesophageal reflux disease)    Hypertension    Hypothyroid    Iron deficiency anemia, unspecified    Neuropathy    Sleep apnea    cpap is not working per patient - needs to find a new doctor - not used since 7/16 per pat    Stage 3 chronic kidney disease (HCC)    Vertigo    followed by Dr Angelena Sole- in [place]     Past Surgical History:  Procedure Laterality Date   ABDOMINAL HYSTERECTOMY     BIOPSY  06/08/2018   Procedure: BIOPSY;  Surgeon: Malissa Hippo, MD;  [facility];  Service: Endoscopy;;  (  colon)   CHOLECYSTECTOMY     COLON SURGERY     for Crohn's in the 80s in [place]   COLONOSCOPY N/A 03/11/2013   Procedure: COLONOSCOPY;  Surgeon: Malissa Hippo, MD;  [facility];  Service: Endoscopy;  Laterality: N/A;  1200   COLONOSCOPY N/A 06/08/2018   Procedure: COLONOSCOPY;  Surgeon: Malissa Hippo, MD;  [facility];  Service: Endoscopy;  Laterality: N/A;  8:25   fatty tumor removed     HARDWARE REMOVAL Right 08/07/2016   Procedure: HARDWARE REMOVAL;  Surgeon: Ollen Gross, MD;  Location: [facility];  Service: Orthopedics;  Laterality: Right;   Hip replacement      Rt hip in 2010 in [place]   KNEE ARTHROSCOPY Left 03/29/2015   Procedure: ARTHROSCOPY LEFT KNEE WITH  MENSICAL DEBRIDEMENT, chondroplasty;  Surgeon: Ollen Gross, MD;  Location: [facility];  Service: Orthopedics;  Laterality: Left;   ORIF TIBIA PLATEAU Right 02/29/2016   Procedure: OPEN REDUCTION INTERNAL FIXATION (ORIF RIGHT  TIBIAL PLATEAU FRACTURE;  Surgeon: Ollen Gross, MD;  [facility];  Service: Orthopedics;  Laterality: Right;   TEE WITHOUT CARDIOVERSION N/A 03/27/2021   Procedure: TRANSESOPHAGEAL ECHOCARDIOGRAM (TEE);  Surgeon: Antoine Poche, MD;  [facility];  Service: Endoscopy;  Laterality: N/A;   TOTAL KNEE ARTHROPLASTY Right 08/25/2017   Procedure: TOTAL RIGHT KNEE ARTHROPLASTY;  Surgeon: Ollen Gross, MD;  [facility];  Service: Orthopedics;  Laterality: Right;    Family Psychiatric History: See below  Family History:  Family History  Problem Relation Age of Onset   Crohn's disease Brother    Depression Mother    Anxiety disorder Mother    Depression Sister    Aneurysm Father    Colon cancer Neg Hx     Social History:  Social History   Socioeconomic History   Marital status: Married    Spouse name: Not on file   Number of children: Not on file   Years of education: Not on file   Highest education level: Not on file  Occupational History   Not on file  Tobacco Use   Smoking status: Never   Smokeless tobacco: Never  Vaping Use   Vaping status: Never Used  Substance and Sexual Activity   Alcohol use: No    Alcohol/week: 0.0 standard drinks of alcohol   Drug use: No   Sexual activity: Not Currently  Other Topics Concern   Not on file  Social History Narrative   Not on file   Social Determinants of Health   Financial Resource Strain: Not on file  Food Insecurity: Not on file  Transportation Needs: Not on file  Physical Activity: Not on file  Stress: Not on file  Social Connections: Not on file    Allergies:  Allergies  Allergen Reactions   Morphine And Codeine Hives and Itching    May cause blood pressure to drop    Robaxin [Methocarbamol] Itching   Sulfa Antibiotics Itching    ALL SULFA DRUGS    Metabolic Disorder Labs: Lab Results  Component Value Date   HGBA1C 5.1 07/02/2022   No results found for: "PROLACTIN" Lab Results  Component Value Date   CHOL 162 07/02/2022   TRIG 110 07/02/2022   HDL 45 07/02/2022   CHOLHDL 3.6 07/02/2022   LDLCALC 97 07/02/2022   Lab Results  Component Value Date   TSH 1.910 07/02/2022    Therapeutic Level Labs: No results found for: "LITHIUM" No results found  for: "VALPROATE" No results found for: "CBMZ"  Current Medications: Current Outpatient Medications  Medication Sig Dispense Refill   buPROPion (WELLBUTRIN XL) 150 MG 24 hr tablet Take 1 tablet (150 mg total) by mouth every morning. 30 tablet 2   amLODipine (NORVASC) 10 MG tablet Take 1 tablet by mouth once daily 90 tablet 1   dicyclomine (BENTYL) 10 MG capsule Take 1 capsule (10 mg total) by mouth 2 (two) times daily before a meal. 60 capsule 5   ferrous sulfate 325 (65 FE) MG EC tablet Take 1 tablet (325 mg total) by mouth daily with breakfast. 90 tablet 3   gabapentin (NEURONTIN) 300 MG capsule TAKE 1 CAPSULE BY MOUTH THREE TIMES DAILY 90 capsule 0   hydrALAZINE (APRESOLINE) 50 MG tablet Take 50 mg by mouth 2 (two) times daily.     HYDROcodone-acetaminophen (NORCO/VICODIN) 5-325 MG tablet Take 1 tablet by mouth at bedtime.     levothyroxine (SYNTHROID) 100 MCG tablet Take 100 mcg by mouth daily.     loperamide (IMODIUM) 2 MG capsule TAKE 1 CAPSULE BY MOUTH TWICE DAILY AS NEEDED FOR LOOSE STOOLS 180 capsule 1   metoprolol succinate (TOPROL-XL) 25 MG 24 hr tablet TAKE 1 & 1/2 (ONE & ONE-HALF) TABLETS BY MOUTH ONCE DAILY 45 tablet 3   Multiple Vitamin (MULTIVITAMIN WITH MINERALS) TABS tablet Take 1 tablet by mouth daily.     ondansetron (ZOFRAN-ODT) 4 MG disintegrating tablet Take 1 tablet (4 mg total) by mouth every 8 (eight) hours as needed. 20 tablet 0   pantoprazole (PROTONIX) 40 MG tablet Take 1  tablet (40 mg total) by mouth daily before breakfast. 90 tablet 3   potassium chloride (KLOR-CON) 10 MEQ tablet Take 10 mEq by mouth daily.     rOPINIRole (REQUIP) 0.5 MG tablet Take 0.5-1 mg by mouth in the morning and at bedtime. One in the morning, and two at night     rOPINIRole (REQUIP) 1 MG tablet Take 1 mg by mouth at bedtime.     rosuvastatin (CRESTOR) 10 MG tablet Take 1 tablet (10 mg total) by mouth daily. 90 tablet 2   sertraline (ZOLOFT) 100 MG tablet Take 1 tablet (100 mg total) by mouth daily. 90 tablet 0   spironolactone (ALDACTONE) 50 MG tablet Take 50 mg by mouth daily.     No current facility-administered medications for this visit.     Musculoskeletal: Strength & Muscle Tone: within normal limits Gait & Station: normal Patient leans: N/A  Psychiatric Specialty Exam: Review of Systems  Constitutional:  Positive for fatigue and unexpected weight change.  Gastrointestinal:  Positive for nausea.  Psychiatric/Behavioral:  Positive for sleep disturbance.   All other systems reviewed and are negative.   There were no vitals taken for this visit.There is no height or weight on file to calculate BMI.  General Appearance: Casual and Fairly Groomed  Eye Contact:  Good  Speech:  Clear and Coherent  Volume:  Normal  Mood:  Anxious  Affect:  Flat  Thought Process:  Goal Directed  Orientation:  Full (Time, Place, and Person)  Thought Content: Rumination   Suicidal Thoughts:  No  Homicidal Thoughts:  No  Memory:  Immediate;   Good Recent;   Good Remote;   NA  Judgement:  Good  Insight:  Good  Psychomotor Activity:  Decreased  Concentration:  Concentration: Good and Attention Span: Good  Recall:  Good  Fund of Knowledge: Good  Language: Good  Akathisia:  No  Handed:  Right  AIMS (if indicated): not done  Assets:  Communication Skills Desire for Improvement Resilience Social Support  ADL's:  Intact  Cognition: WNL  Sleep:  Fair   Screenings: GAD-7     Garment/textile technologist Visit from 09/30/2022 in [facility] [place] Office Visit from 08/23/2021 in [facility] [place]  Total GAD-7 Score 4 14      PHQ2-9    Flowsheet Row Office Visit from 09/30/2022 in [facility] [place] Office Visit from 08/23/2021 in [facility] at [place] Video Visit from 12/22/2020 in [facility] at [facility] Total Score 0 1 2  PHQ-9 Total Score 3 6 6       Flowsheet Row ED from 03/18/2023 in [facility] Emergency Department at [facility] ED from 02/26/2023 in [facility] Emergency Department at [facility] Office Visit from 08/23/2021 in [facility] at [place]  C-SSRS RISK CATEGORY No Risk No Risk No Risk        Assessment and Plan:  This patient is a 72 year old female with a history of depression and anxiety.  She is now presenting with decreased hemoglobin hematocrit fatigue and decreased platelets.  I suggested that we slowly try to get her off the Zoloft so we will decrease from 150 to 100 mg daily.  At the same time we will start Wellbutrin XL 150 mg daily.  She will return to see me in 4 weeks Collaboration of Care: Collaboration of Care: Other provider involved in patient's care AEB notes are shared with all specialist on the epic system  Patient/Guardian was advised Release of Information must be obtained prior to any record release in order to collaborate their care with an outside provider. Patient/Guardian was advised if they have not already done so to contact the registration department to sign all necessary forms in order for [place] to release information regarding their care.   Consent: Patient/Guardian gives verbal consent for treatment and assignment of benefits for services provided during this visit. Patient/Guardian expressed  understanding and agreed to proceed.    Diannia Ruder, MD 03/19/2023, 1:20 PM

## 2023-03-20 DIAGNOSIS — I10 Essential (primary) hypertension: Secondary | ICD-10-CM | POA: Diagnosis not present

## 2023-03-20 DIAGNOSIS — R5383 Other fatigue: Secondary | ICD-10-CM | POA: Diagnosis not present

## 2023-03-20 DIAGNOSIS — D333 Benign neoplasm of cranial nerves: Secondary | ICD-10-CM | POA: Diagnosis not present

## 2023-03-20 DIAGNOSIS — Z299 Encounter for prophylactic measures, unspecified: Secondary | ICD-10-CM | POA: Diagnosis not present

## 2023-03-20 DIAGNOSIS — K5010 Crohn's disease of large intestine without complications: Secondary | ICD-10-CM | POA: Diagnosis not present

## 2023-03-21 DIAGNOSIS — R809 Proteinuria, unspecified: Secondary | ICD-10-CM | POA: Diagnosis not present

## 2023-03-21 DIAGNOSIS — Z23 Encounter for immunization: Secondary | ICD-10-CM | POA: Diagnosis not present

## 2023-03-21 DIAGNOSIS — N1832 Chronic kidney disease, stage 3b: Secondary | ICD-10-CM | POA: Diagnosis not present

## 2023-03-21 DIAGNOSIS — D638 Anemia in other chronic diseases classified elsewhere: Secondary | ICD-10-CM | POA: Diagnosis not present

## 2023-03-21 NOTE — Progress Notes (Signed)
Patient called wanting to know how long she should wait to get the flu shot and COVID shot since she just had an iron infusion on 03/14/2023.   Patient made aware that per Durenda Hurt, NP that it doesn't matter when she gets covid or flu related to iron infusion. She is agreeable with being able to go ahead and get the flu and COVID shots.

## 2023-03-25 DIAGNOSIS — D638 Anemia in other chronic diseases classified elsewhere: Secondary | ICD-10-CM | POA: Diagnosis not present

## 2023-03-25 DIAGNOSIS — R809 Proteinuria, unspecified: Secondary | ICD-10-CM | POA: Diagnosis not present

## 2023-03-25 DIAGNOSIS — N1832 Chronic kidney disease, stage 3b: Secondary | ICD-10-CM | POA: Diagnosis not present

## 2023-03-26 ENCOUNTER — Ambulatory Visit: Payer: Medicare Other | Admitting: Cardiology

## 2023-03-28 ENCOUNTER — Other Ambulatory Visit: Payer: Self-pay

## 2023-03-28 ENCOUNTER — Encounter (HOSPITAL_COMMUNITY): Payer: Self-pay

## 2023-03-28 ENCOUNTER — Encounter (HOSPITAL_COMMUNITY)
Admission: RE | Admit: 2023-03-28 | Discharge: 2023-03-28 | Disposition: A | Discharge status: Home / Self Care | Payer: Medicare Other | Source: Ambulatory Visit | Attending: Gastroenterology

## 2023-03-28 DIAGNOSIS — D638 Anemia in other chronic diseases classified elsewhere: Secondary | ICD-10-CM | POA: Diagnosis not present

## 2023-03-28 DIAGNOSIS — I129 Hypertensive chronic kidney disease with stage 1 through stage 4 chronic kidney disease, or unspecified chronic kidney disease: Secondary | ICD-10-CM | POA: Diagnosis not present

## 2023-03-28 DIAGNOSIS — N1832 Chronic kidney disease, stage 3b: Secondary | ICD-10-CM | POA: Diagnosis not present

## 2023-03-28 NOTE — Pre-Procedure Instructions (Signed)
Attempted pre-op phonecall. Left VM for her to call us back. 

## 2023-03-28 NOTE — Patient Instructions (Signed)
Jessica [name]  03/28/2023     @PREFPERIOPPHARMACY @   Your procedure is scheduled on  04/01/2023.   Report to [facility] at  1030  A.M.   Call this number if you have problems the morning of surgery:  [phone]  If you experience any cold or flu symptoms such as cough, fever, chills, shortness of breath, etc. between now and your scheduled surgery, please notify us at the above number.   Remember:  Follow the diet and prep instructions given to you by the office.     Take these medicines the morning of surgery with A SIP OF WATER          amlodipine, bupropion, gabapentin, hydrocodone(if needed), levothyroxine, metoprolol, zofran (if needed), pantoprazole, requip, sertraline.     Do not wear jewelry, make-up or nail polish, including gel polish,  artificial nails, or any other type of covering on natural nails (fingers and  toes).  Do not wear lotions, powders, or perfumes, or deodorant.  Do not shave 48 hours prior to surgery.  Men may shave face and neck.  Do not bring valuables to the hospital.  [facility] is not responsible for any belongings or valuables.  Contacts, dentures or bridgework may not be worn into surgery.  Leave your suitcase in the car.  After surgery it may be brought to your room.  For patients admitted to the hospital, discharge time will be determined by your treatment team.  Patients discharged the day of surgery will not be allowed to drive home and must have someone with them for 24 hours.    Special instructions:   DO NOT smoke tobacco or vape for 24 hours before your procedure.  Please read over the following fact sheets that you were given. Anesthesia Post-op Instructions and Care and Recovery After Surgery        Upper Endoscopy, Adult, Care After After the procedure, it is common to have a sore throat. It is also common to have: Mild stomach pain or discomfort. Bloating. Nausea. Follow these instructions at home: The  instructions below may help you care for yourself at home. Your health care provider may give you more instructions. If you have questions, ask your health care provider. If you were given a sedative during the procedure, it can affect you for several hours. Do not drive or operate machinery until your health care provider says that it is safe. If you will be going home right after the procedure, plan to have a responsible adult: Take you home from the hospital or clinic. You will not be allowed to drive. Care for you for the time you are told. Follow instructions from your health care provider about what you may eat and drink. Return to your normal activities as told by your health care provider. Ask your health care provider what activities are safe for you. Take over-the-counter and prescription medicines only as told by your health care provider. Contact a health care provider if you: Have a sore throat that lasts longer than one day. Have trouble swallowing. Have a fever. Get help right away if you: Vomit blood or your vomit looks like coffee grounds. Have bloody, black, or tarry stools. Have a very bad sore throat or you cannot swallow. Have difficulty breathing or very bad pain in your chest or abdomen. These symptoms may be an emergency. Get help right away. Call 911. Do not wait to see if the symptoms will go  away. Do not drive yourself to the hospital. Summary After the procedure, it is common to have a sore throat, mild stomach discomfort, bloating, and nausea. If you were given a sedative during the procedure, it can affect you for several hours. Do not drive until your health care provider says that it is safe. Follow instructions from your health care provider about what you may eat and drink. Return to your normal activities as told by your health care provider. This information is not intended to replace advice given to you by your health care provider. Make sure you discuss  any questions you have with your health care provider. Document Revised: 10/03/2021 Document Reviewed: 10/03/2021 Elsevier Patient Education  2024 [employer]. Colonoscopy, Adult, Care After The following information offers guidance on how to care for yourself after your procedure. Your health care provider may also give you more specific instructions. If you have problems or questions, contact your health care provider. What can I expect after the procedure? After the procedure, it is common to have: A small amount of blood in your stool for 24 hours after the procedure. Some gas. Mild cramping or bloating of your abdomen. Follow these instructions at home: Eating and drinking  Drink enough fluid to keep your urine pale yellow. Follow instructions from your health care provider about eating or drinking restrictions. Resume your normal diet as told by your health care provider. Avoid heavy or fried foods that are hard to digest. Activity Rest as told by your health care provider. Avoid sitting for a long time without moving. Get up to take short walks every 1-2 hours. This is important to improve blood flow and breathing. Ask for help if you feel weak or unsteady. Return to your normal activities as told by your health care provider. Ask your health care provider what activities are safe for you. Managing cramping and bloating  Try walking around when you have cramps or feel bloated. If directed, apply heat to your abdomen as told by your health care provider. Use the heat source that your health care provider recommends, such as a moist heat pack or a heating pad. Place a towel between your skin and the heat source. Leave the heat on for 20-30 minutes. Remove the heat if your skin turns bright red. This is especially important if you are unable to feel pain, heat, or cold. You have a greater risk of getting burned. General instructions If you were given a sedative during the procedure,  it can affect you for several hours. Do not drive or operate machinery until your health care provider says that it is safe. For the first 24 hours after the procedure: Do not sign important documents. Do not drink alcohol. Do your regular daily activities at a slower pace than normal. Eat soft foods that are easy to digest. Take over-the-counter and prescription medicines only as told by your health care provider. Keep all follow-up visits. This is important. Contact a health care provider if: You have blood in your stool 2-3 days after the procedure. Get help right away if: You have more than a small spotting of blood in your stool. You have large blood clots in your stool. You have swelling of your abdomen. You have nausea or vomiting. You have a fever. You have increasing pain in your abdomen that is not relieved with medicine. These symptoms may be an emergency. Get help right away. Call 911. Do not wait to see if the symptoms will go  away. Do not drive yourself to the hospital. Summary After the procedure, it is common to have a small amount of blood in your stool. You may also have mild cramping and bloating of your abdomen. If you were given a sedative during the procedure, it can affect you for several hours. Do not drive or operate machinery until your health care provider says that it is safe. Get help right away if you have a lot of blood in your stool, nausea or vomiting, a fever, or increased pain in your abdomen. This information is not intended to replace advice given to you by your health care provider. Make sure you discuss any questions you have with your health care provider. Document Revised: 08/06/2022 Document Reviewed: 02/14/2021 Elsevier Patient Education  2024 Elsevier Inc. Monitored Anesthesia Care, Care After The following information offers guidance on how to care for yourself after your procedure. Your health care provider may also give you more specific  instructions. If you have problems or questions, contact your health care provider. What can I expect after the procedure? After the procedure, it is common to have: Tiredness. Little or no memory about what happened during or after the procedure. Impaired judgment when it comes to making decisions. Nausea or vomiting. Some trouble with balance. Follow these instructions at home: For the time period you were told by your health care provider:  Rest. Do not participate in activities where you could fall or become injured. Do not drive or use machinery. Do not drink alcohol. Do not take sleeping pills or medicines that cause drowsiness. Do not make important decisions or sign legal documents. Do not take care of children on your own. Medicines Take over-the-counter and prescription medicines only as told by your health care provider. If you were prescribed antibiotics, take them as told by your health care provider. Do not stop using the antibiotic even if you start to feel better. Eating and drinking Follow instructions from your health care provider about what you may eat and drink. Drink enough fluid to keep your urine pale yellow. If you vomit: Drink clear fluids slowly and in small amounts as you are able. Clear fluids include water, ice chips, low-calorie sports drinks, and fruit juice that has water added to it (diluted fruit juice). Eat light and bland foods in small amounts as you are able. These foods include bananas, applesauce, rice, lean meats, toast, and crackers. General instructions  Have a responsible adult stay with you for the time you are told. It is important to have someone help care for you until you are awake and alert. If you have sleep apnea, surgery and some medicines can increase your risk for breathing problems. Follow instructions from your health care provider about wearing your sleep device: When you are sleeping. This includes during daytime naps. While  taking prescription pain medicines, sleeping medicines, or medicines that make you drowsy. Do not use any products that contain nicotine or tobacco. These products include cigarettes, chewing tobacco, and vaping devices, such as e-cigarettes. If you need help quitting, ask your health care provider. Contact a health care provider if: You feel nauseous or vomit every time you eat or drink. You feel light-headed. You are still sleepy or having trouble with balance after 24 hours. You get a rash. You have a fever. You have redness or swelling around the IV site. Get help right away if: You have trouble breathing. You have new confusion after you get home. These symptoms may be  an emergency. Get help right away. Call 911. Do not wait to see if the symptoms will go away. Do not drive yourself to the hospital. This information is not intended to replace advice given to you by your health care provider. Make sure you discuss any questions you have with your health care provider. Document Revised: 11/19/2021 Document Reviewed: 11/19/2021 Elsevier Patient Education  2024 [employer].

## 2023-04-01 ENCOUNTER — Encounter (HOSPITAL_COMMUNITY)
Admission: RE | Disposition: A | Discharge status: Home / Self Care | Payer: Self-pay | Source: Home / Self Care | Attending: Gastroenterology

## 2023-04-01 ENCOUNTER — Encounter (HOSPITAL_COMMUNITY): Payer: Self-pay | Admitting: Gastroenterology

## 2023-04-01 ENCOUNTER — Ambulatory Visit (HOSPITAL_BASED_OUTPATIENT_CLINIC_OR_DEPARTMENT_OTHER): Payer: Medicare Other | Admitting: Certified Registered"

## 2023-04-01 ENCOUNTER — Ambulatory Visit (HOSPITAL_COMMUNITY)
Admission: RE | Admit: 2023-04-01 | Discharge: 2023-04-01 | Disposition: A | Discharge status: Home / Self Care | Payer: Medicare Other | Attending: Gastroenterology | Admitting: Gastroenterology

## 2023-04-01 ENCOUNTER — Ambulatory Visit (HOSPITAL_COMMUNITY): Payer: Medicare Other | Admitting: Certified Registered"

## 2023-04-01 DIAGNOSIS — K449 Diaphragmatic hernia without obstruction or gangrene: Secondary | ICD-10-CM

## 2023-04-01 DIAGNOSIS — K633 Ulcer of intestine: Secondary | ICD-10-CM | POA: Insufficient documentation

## 2023-04-01 DIAGNOSIS — G4730 Sleep apnea, unspecified: Secondary | ICD-10-CM | POA: Diagnosis not present

## 2023-04-01 DIAGNOSIS — F32A Depression, unspecified: Secondary | ICD-10-CM | POA: Insufficient documentation

## 2023-04-01 DIAGNOSIS — R1319 Other dysphagia: Secondary | ICD-10-CM

## 2023-04-01 DIAGNOSIS — D123 Benign neoplasm of transverse colon: Secondary | ICD-10-CM | POA: Insufficient documentation

## 2023-04-01 DIAGNOSIS — N183 Chronic kidney disease, stage 3 unspecified: Secondary | ICD-10-CM | POA: Insufficient documentation

## 2023-04-01 DIAGNOSIS — I129 Hypertensive chronic kidney disease with stage 1 through stage 4 chronic kidney disease, or unspecified chronic kidney disease: Secondary | ICD-10-CM | POA: Diagnosis not present

## 2023-04-01 DIAGNOSIS — K529 Noninfective gastroenteritis and colitis, unspecified: Secondary | ICD-10-CM | POA: Diagnosis not present

## 2023-04-01 DIAGNOSIS — K2100 Gastro-esophageal reflux disease with esophagitis, without bleeding: Secondary | ICD-10-CM | POA: Diagnosis not present

## 2023-04-01 DIAGNOSIS — Z9049 Acquired absence of other specified parts of digestive tract: Secondary | ICD-10-CM | POA: Insufficient documentation

## 2023-04-01 DIAGNOSIS — K635 Polyp of colon: Secondary | ICD-10-CM | POA: Diagnosis not present

## 2023-04-01 DIAGNOSIS — K648 Other hemorrhoids: Secondary | ICD-10-CM

## 2023-04-01 DIAGNOSIS — R1310 Dysphagia, unspecified: Secondary | ICD-10-CM | POA: Diagnosis not present

## 2023-04-01 DIAGNOSIS — K209 Esophagitis, unspecified without bleeding: Secondary | ICD-10-CM | POA: Diagnosis not present

## 2023-04-01 DIAGNOSIS — E039 Hypothyroidism, unspecified: Secondary | ICD-10-CM | POA: Diagnosis not present

## 2023-04-01 DIAGNOSIS — K5090 Crohn's disease, unspecified, without complications: Secondary | ICD-10-CM | POA: Diagnosis not present

## 2023-04-01 DIAGNOSIS — Z980 Intestinal bypass and anastomosis status: Secondary | ICD-10-CM | POA: Diagnosis not present

## 2023-04-01 DIAGNOSIS — K5000 Crohn's disease of small intestine without complications: Secondary | ICD-10-CM | POA: Diagnosis not present

## 2023-04-01 DIAGNOSIS — K208 Other esophagitis without bleeding: Secondary | ICD-10-CM | POA: Diagnosis not present

## 2023-04-01 HISTORY — PX: COLONOSCOPY WITH PROPOFOL: SHX5780

## 2023-04-01 HISTORY — PX: ESOPHAGEAL BRUSHING: SHX6842

## 2023-04-01 HISTORY — PX: ESOPHAGOGASTRODUODENOSCOPY (EGD) WITH PROPOFOL: SHX5813

## 2023-04-01 HISTORY — PX: POLYPECTOMY: SHX5525

## 2023-04-01 HISTORY — DX: Other dysphagia: R13.19

## 2023-04-01 HISTORY — PX: BIOPSY: SHX5522

## 2023-04-01 LAB — KOH PREP: KOH Prep: NONE SEEN

## 2023-04-01 LAB — HEPATITIS B SURFACE ANTIGEN: Hepatitis B Surface Ag: NONREACTIVE

## 2023-04-01 LAB — HM COLONOSCOPY

## 2023-04-01 SURGERY — ESOPHAGOGASTRODUODENOSCOPY (EGD) WITH PROPOFOL
Anesthesia: General

## 2023-04-01 MED ORDER — PROPOFOL 500 MG/50ML IV EMUL
INTRAVENOUS | Status: DC | PRN
  Administered 2023-04-01: 150 ug/kg/min via INTRAVENOUS

## 2023-04-01 MED ORDER — PROPOFOL 10 MG/ML IV BOLUS
INTRAVENOUS | Status: DC | PRN
  Administered 2023-04-01: 80 mg via INTRAVENOUS
  Administered 2023-04-01: 40 mg via INTRAVENOUS
  Administered 2023-04-01: 20 mg via INTRAVENOUS

## 2023-04-01 MED ORDER — DEXMEDETOMIDINE HCL IN NACL 80 MCG/20ML IV SOLN
INTRAVENOUS | Status: DC | PRN
Start: 2023-04-01 — End: 2023-04-01
  Administered 2023-04-01: 8 ug via INTRAVENOUS
  Administered 2023-04-01: 12 ug via INTRAVENOUS

## 2023-04-01 MED ORDER — LIDOCAINE HCL (CARDIAC) PF 100 MG/5ML IV SOSY
PREFILLED_SYRINGE | INTRAVENOUS | Status: DC | PRN
  Administered 2023-04-01: 60 mg via INTRAVENOUS

## 2023-04-01 MED ORDER — LACTATED RINGERS IV SOLN: INTRAVENOUS | Status: DC

## 2023-04-01 MED ORDER — PHENYLEPHRINE 80 MCG/ML (10ML) SYRINGE FOR IV PUSH (FOR BLOOD PRESSURE SUPPORT)
PREFILLED_SYRINGE | INTRAVENOUS | Status: DC | PRN
Start: 2023-04-01 — End: 2023-04-01
  Administered 2023-04-01 (×2): 160 ug via INTRAVENOUS
  Administered 2023-04-01: 240 ug via INTRAVENOUS

## 2023-04-01 MED ORDER — PANTOPRAZOLE SODIUM 40 MG PO TBEC: 40.0000 mg | DELAYED_RELEASE_TABLET | Freq: Two times a day (BID) | ORAL | 3 refills | Status: DC

## 2023-04-01 MED ORDER — SODIUM CHLORIDE FLUSH 0.9 % IV SOLN
INTRAVENOUS | Status: AC
  Filled 2023-04-01: qty 10

## 2023-04-01 NOTE — Discharge Instructions (Addendum)
You are being discharged to home.  Resume your previous diet.  We are waiting for your pathology results.  Take Protonix (pantoprazole) 40 mg by mouth twice a day.  Your physician has recommended a repeat colonoscopy for surveillance based on pathology results.  Will check TB testing and hepatitis B surface antigen in anticipation of treatment with Stelara/Entyvio/Skyrizi.

## 2023-04-01 NOTE — Op Note (Signed)
[facility] Patient Name: Jessica [name] Procedure Date: 04/01/2023 11:05 AM MRN: [id number] Date of Birth: 1950/07/26 Attending MD: Katrinka Blazing , , [id number] CSN: [id number] Age: 72 Admit Type: Outpatient Procedure:                Upper GI endoscopy Indications:              Dysphagia, Gastro-esophageal reflux disease Providers:                Katrinka Blazing, Buel Ream. Thomasena Edis RN, RN, Elinor Parkinson Referring MD:              Medicines:                Monitored Anesthesia Care Complications:            No immediate complications. Estimated Blood Loss:     Estimated blood loss: none. Procedure:                Pre-Anesthesia Assessment:                           - Prior to the procedure, a History and Physical                            was performed, and patient medications, allergies                            and sensitivities were reviewed. The patient's                            tolerance of previous anesthesia was reviewed.                           - The risks and benefits of the procedure and the                            sedation options and risks were discussed with the                            patient. All questions were answered and informed                            consent was obtained.                           - ASA Grade Assessment: II - A patient with mild                            systemic disease.                           After obtaining informed consent, the endoscope was                            passed under direct vision. Throughout the  procedure, the patient's blood pressure, pulse, and                            oxygen saturations were monitored continuously. The                            [id number]-[id number] ([id number]) scope was introduced through the                            mouth, and advanced to the second part of duodenum.                            The upper GI endoscopy was accomplished  without                            difficulty. The patient tolerated the procedure                            well. Scope In: 11:31:16 AM Scope Out: 11:37:13 AM Total Procedure Duration: 0 hours 5 minutes 57 seconds  Findings:      LA Grade C (one or more mucosal breaks continuous between tops of 2 or       more mucosal folds, less than 75% circumference) esophagitis with no       bleeding was found in the lower third of the esophagus. Biopsies were       taken with a cold forceps for histology. Brushings for KOH prep were       obtained.      A 4 cm hiatal hernia was present.      The stomach was normal.      The examined duodenum was normal. Impression:               - LA Grade C esophagitis with no bleeding.                            Biopsied. Brushings performed.                           - 4 cm hiatal hernia.                           - Normal stomach.                           - Normal examined duodenum. Moderate Sedation:      Per Anesthesia Care Recommendation:           - Discharge patient to home (ambulatory).                           - Resume previous diet.                           - Await pathology results.                           - Use Protonix (pantoprazole) 40 mg PO BID. Procedure Code(s):        ---  Professional ---                           [phone], Esophagogastroduodenoscopy, flexible,                            transoral; with biopsy, single or multiple Diagnosis Code(s):        --- Professional ---                           K20.90, Esophagitis, unspecified without bleeding                           K44.9, Diaphragmatic hernia without obstruction or                            gangrene                           R13.10, Dysphagia, unspecified                           K21.9, Gastro-esophageal reflux disease without                            esophagitis CPT copyright 2022 American [employer]. All rights reserved. The codes documented in this report  are preliminary and upon coder review may  be revised to meet current compliance requirements. Katrinka Blazing, MD Katrinka Blazing,  04/01/2023 11:41:28 AM This report has been signed electronically. Number of Addenda: 0

## 2023-04-01 NOTE — H&P (Signed)
Jessica [name] is an 72 y.o. female.   Chief Complaint: dysphagia, diarrhea and abdominal pain.  HPI: 72 y.o. female with a history of small bowel and colonic Crohn's disease with remote right hemicolectomy (not on any medical therapy) with pinhole fistula on previous colonoscopy, CKD, GERD, depression, hypothyroidism, hypertension, sleep apnea, comes to the hospital for evaluation of dysphagia, diarrhea and abdominal pain.  Patient reports having persistent diarrhea having up to 8 bowel movements per day with only having 6 bowel movements per day in a good day.  States that she usually has watery bowel movements without melena or hematochezia.  Reports having some abdominal pain.  States that she has presenting dysphagia to solids.  Recently underwent a CT of the abdomen and pelvis with IV contrast that showed presence of possible early cirrhosis.  Past Medical History:  Diagnosis Date   Anxiety attack    Arthritis    Back pain, chronic    Chronic kidney disease    Crohn's colitis (HCC)    Depression (emotion)    Dysrhythmia    GERD (gastroesophageal reflux disease)    Hypertension    Hypothyroid    Iron deficiency anemia, unspecified    Neuropathy    Sleep apnea    cpap is not working per patient - needs to find a new doctor - not used since 7/16 per pat    Stage 3 chronic kidney disease (HCC)    Vertigo    followed by Dr Angelena Sole- in [place]     Past Surgical History:  Procedure Laterality Date   ABDOMINAL HYSTERECTOMY     BIOPSY  06/08/2018   Procedure: BIOPSY;  Surgeon: Malissa Hippo, MD;  Location: [facility];  Service: Endoscopy;;  (colon)   CHOLECYSTECTOMY     COLON SURGERY     for Crohn's in the 80s in [place]   COLONOSCOPY N/A 03/11/2013   Procedure: COLONOSCOPY;  Surgeon: Malissa Hippo, MD;  Location: [facility];  Service: Endoscopy;  Laterality: N/A;  1200   COLONOSCOPY N/A 06/08/2018   Procedure: COLONOSCOPY;  Surgeon: Malissa Hippo, MD;  Location: [facility];  Service: Endoscopy;  Laterality: N/A;  8:25   fatty tumor removed     HARDWARE REMOVAL Right 08/07/2016   Procedure: HARDWARE REMOVAL;  Surgeon: Ollen Gross, MD;  Location: [facility];  Service: Orthopedics;  Laterality: Right;   Hip replacement      Rt hip in 2010 in [place]   KNEE ARTHROSCOPY Left 03/29/2015   Procedure: ARTHROSCOPY LEFT KNEE WITH MENSICAL DEBRIDEMENT, chondroplasty;  Surgeon: Ollen Gross, MD;  Location: [facility];  Service: Orthopedics;  Laterality: Left;   ORIF TIBIA PLATEAU Right 02/29/2016   Procedure: OPEN REDUCTION INTERNAL FIXATION (ORIF RIGHT  TIBIAL PLATEAU FRACTURE;  Surgeon: Ollen Gross, MD;  Location: [facility];  Service: Orthopedics;  Laterality: Right;   TEE WITHOUT CARDIOVERSION N/A 03/27/2021   Procedure: TRANSESOPHAGEAL ECHOCARDIOGRAM (TEE);  Surgeon: Antoine Poche, MD;  Location: [facility];  Service: Endoscopy;  Laterality: N/A;   TOTAL KNEE ARTHROPLASTY Right 08/25/2017   Procedure: TOTAL RIGHT KNEE ARTHROPLASTY;  Surgeon: Ollen Gross, MD;  Location: [facility];  Service: Orthopedics;  Laterality: Right;    Family History  Problem Relation Age of Onset   Crohn's disease Brother    Depression Mother    Anxiety disorder Mother    Depression Sister    Aneurysm Father    Colon cancer Neg Hx    Social History:  reports that she has never smoked. She has never used smokeless tobacco. She reports that she does not drink alcohol and does not use drugs.  Allergies:  Allergies  Allergen Reactions   Morphine And Codeine Hives and Itching    May cause blood pressure to drop   Robaxin [Methocarbamol] Itching   Sulfa Antibiotics Itching    ALL SULFA DRUGS    Medications Prior to Admission  Medication Sig Dispense Refill   amLODipine (NORVASC) 10 MG tablet Take 1 tablet by mouth once daily 90 tablet 1   buPROPion (WELLBUTRIN XL) 150 MG 24 hr tablet Take 1 tablet (150 mg total) by mouth every morning. 30 tablet 2   dicyclomine (BENTYL) 10 MG  capsule Take 1 capsule (10 mg total) by mouth 2 (two) times daily before a meal. 60 capsule 5   gabapentin (NEURONTIN) 300 MG capsule TAKE 1 CAPSULE BY MOUTH THREE TIMES DAILY 90 capsule 0   hydrALAZINE (APRESOLINE) 50 MG tablet Take 50 mg by mouth 2 (two) times daily.     HYDROcodone-acetaminophen (NORCO/VICODIN) 5-325 MG tablet Take 1 tablet by mouth at bedtime.     levothyroxine (SYNTHROID) 100 MCG tablet Take 100 mcg by mouth daily.     loperamide (IMODIUM) 2 MG capsule TAKE 1 CAPSULE BY MOUTH TWICE DAILY AS NEEDED FOR LOOSE STOOLS 180 capsule 1   metoprolol succinate (TOPROL-XL) 25 MG 24 hr tablet TAKE 1 & 1/2 (ONE & ONE-HALF) TABLETS BY MOUTH ONCE DAILY 45 tablet 3   Multiple Vitamin (MULTIVITAMIN WITH MINERALS) TABS tablet Take 1 tablet by mouth daily.     ondansetron (ZOFRAN-ODT) 4 MG disintegrating tablet Take 1 tablet (4 mg total) by mouth every 8 (eight) hours as needed. 20 tablet 0   pantoprazole (PROTONIX) 40 MG tablet Take 1 tablet (40 mg total) by mouth daily before breakfast. 90 tablet 3   rOPINIRole (REQUIP) 0.5 MG tablet Take 0.5-1 mg by mouth in the morning and at bedtime. One in the morning, and two at night     rOPINIRole (REQUIP) 1 MG tablet Take 1 mg by mouth at bedtime.     rosuvastatin (CRESTOR) 10 MG tablet Take 1 tablet (10 mg total) by mouth daily. 90 tablet 2   sertraline (ZOLOFT) 100 MG tablet Take 1 tablet (100 mg total) by mouth daily. 90 tablet 0   spironolactone (ALDACTONE) 50 MG tablet Take 50 mg by mouth daily.     ferrous sulfate 325 (65 FE) MG EC tablet Take 1 tablet (325 mg total) by mouth daily with breakfast. 90 tablet 3   potassium chloride (KLOR-CON) 10 MEQ tablet Take 10 mEq by mouth daily.      No results found for this or any previous visit (from the past 48 hour(s)). No results found.  Review of Systems  HENT:  Positive for trouble swallowing.   Gastrointestinal:  Positive for abdominal pain and diarrhea.  All other systems reviewed and are  negative.   Blood pressure (!) 108/59, pulse 73, temperature 98.1 F (36.7 C), temperature source Oral, resp. rate 19, height 5\' 2"  (1.575 m), weight 72 kg, SpO2 100%. Physical Exam  GENERAL: The patient is AO x3, in no acute distress. HEENT: Head is normocephalic and atraumatic. EOMI are intact. Mouth is well hydrated and without lesions. NECK: Supple. No masses LUNGS: Clear to auscultation. No presence of rhonchi/wheezing/rales. Adequate chest expansion HEART: RRR, normal s1 and s2. ABDOMEN: Soft, nontender, no guarding, no peritoneal signs, and nondistended. BS +. No masses.  EXTREMITIES: Without any cyanosis, clubbing, rash, lesions or edema. NEUROLOGIC: AOx3, no focal motor deficit. SKIN: no jaundice, no rashes  Assessment/Plan 72 y.o. female with a history of small bowel and colonic Crohn's disease with remote right hemicolectomy (not on any medical therapy) with pinhole fistula on previous colonoscopy, CKD, GERD, depression, hypothyroidism, hypertension, sleep apnea, comes to the hospital for evaluation of dysphagia, diarrhea and abdominal pain.  Will proceed with EGD and colonoscopy.  Dolores Frame, MD 04/01/2023, 11:12 AM

## 2023-04-01 NOTE — Transfer of Care (Signed)
Immediate Anesthesia Transfer of Care Note  Patient: Jessica [name]  Procedure(s) Performed: ESOPHAGOGASTRODUODENOSCOPY (EGD) WITH PROPOFOL COLONOSCOPY WITH PROPOFOL BIOPSY POLYPECTOMY ESOPHAGEAL BRUSHING  Patient Location: Short Stay  Anesthesia Type:General  Level of Consciousness: drowsy  Airway & Oxygen Therapy: Patient Spontanous Breathing  Post-op Assessment: Report given to RN and Post -op Vital signs reviewed and stable  Post vital signs: Reviewed and stable  Last Vitals:  Vitals Value Taken Time  BP    Temp    Pulse    Resp    SpO2      Last Pain:  Vitals:   04/01/23 1145  TempSrc:   PainSc: 3          Complications: No notable events documented.

## 2023-04-01 NOTE — Op Note (Addendum)
[facility] Patient Name: Jessica [name] Procedure Date: 04/01/2023 11:05 AM MRN: [id number] Date of Birth: 18-May-1951 Attending MD: Katrinka Blazing , , [id number] CSN: [id number] Age: 72 Admit Type: Outpatient Procedure:                Colonoscopy Indications:              Chronic diarrhea Providers:                Katrinka Blazing, Buel Ream. Thomasena Edis RN, RN, Elinor Parkinson Referring MD:              Medicines:                Monitored Anesthesia Care Complications:            No immediate complications. Estimated Blood Loss:     Estimated blood loss: none. Procedure:                Pre-Anesthesia Assessment:                           - Prior to the procedure, a History and Physical                            was performed, and patient medications, allergies                            and sensitivities were reviewed. The patient's                            tolerance of previous anesthesia was reviewed.                           - The risks and benefits of the procedure and the                            sedation options and risks were discussed with the                            patient. All questions were answered and informed                            consent was obtained.                           - ASA Grade Assessment: II - A patient with mild                            systemic disease.                           After obtaining informed consent, the colonoscope                            was passed under direct vision. Throughout the  procedure, the patient's blood pressure, pulse, and                            oxygen saturations were monitored continuously. The                            [id number]-HQ190L ([id number]) scope was introduced through                            the anus and advanced to the the cecum, identified                            by appendiceal orifice and ileocecal valve. The                             colonoscopy was performed without difficulty. The                            patient tolerated the procedure well. The quality                            of the bowel preparation was good. Scope In: 11:43:30 AM Scope Out: 12:20:26 PM Scope Withdrawal Time: 0 hours 32 minutes 29 seconds  Total Procedure Duration: 0 hours 36 minutes 56 seconds  Findings:      The perianal and digital rectal examinations were normal.      Localized inflammation characterized by congestion (edema), erosions and       erythema was found in the neo-terminal Ileum. The inflammation was       graded as Rutgeerts Score i1 (lessthan five aphthous lesions with normal       intervening mucosa or skip areas of larger lesions or lesions confined       to the ileocolonic anastomosis). The scope could be passed by performing       significant maneuvering. Biopsies were taken with a cold forceps for       histology.      There was evidence of a prior end-to-side colo-colonic anastomosis in       the ascending colon. This was patent and was characterized by healthy       appearing mucosa.      Three sessile polyps were found in the transverse colon. The polyps were       4 to 12 mm in size. These polyps were removed with a cold snare.       Resection and retrieval were complete.      Non-bleeding internal hemorrhoids were found during retroflexion. The       hemorrhoids were small. Impression:               - Crohn's disease with ileitis. Inflammation was                            found. This was graded as Rutgeerts Score i1 (less                            than five aphthous lesions with normal intervening  mucosa or skip areas of larger lesions or lesions                            confined to the ileocolonic anastomosis) and with                            assocaited narrowing of the lumen. Biopsied.                           - Patent end-to-side colo-colonic anastomosis,                             characterized by healthy appearing mucosa.                           - Three 4 to 12 mm polyps in the transverse colon,                            removed with a cold snare. Resected and retrieved.                           - Non-bleeding internal hemorrhoids. Moderate Sedation:      Per Anesthesia Care Recommendation:           - Discharge patient to home (ambulatory).                           - Resume previous diet.                           - Await pathology results.                           - Repeat colonoscopy for surveillance based on                            pathology results.                           - Will check TB testing and hepatitis B surface                            antigen in anticipation of treatment with                            Stelara/Entyvio/Skyrizi. Procedure Code(s):        --- Professional ---                           [id number], Colonoscopy, flexible; with removal of                            tumor(s), polyp(s), or other lesion(s) by snare                            technique  96295, 59, Colonoscopy, flexible; with biopsy,                            single or multiple Diagnosis Code(s):        --- Professional ---                           K50.00, Crohn's disease of small intestine without                            complications                           Z98.0, Intestinal bypass and anastomosis status                           D12.3, Benign neoplasm of transverse colon (hepatic                            flexure or splenic flexure)                           K64.8, Other hemorrhoids                           K52.9, Noninfective gastroenteritis and colitis,                            unspecified CPT copyright 2022 American Medical Association. All rights reserved. The codes documented in this report are preliminary and upon coder review may  be revised to meet current compliance requirements. Katrinka Blazing, MD Katrinka Blazing,  04/01/2023 12:31:40 PM This report has been signed electronically. Number of Addenda: 0

## 2023-04-01 NOTE — Anesthesia Preprocedure Evaluation (Signed)
Anesthesia Evaluation  Patient identified by MRN, date of birth, ID band Patient awake    Reviewed: Allergy & Precautions, H&P , NPO status , Patient's Chart, lab work & pertinent test results, reviewed documented beta blocker date and time   Airway Mallampati: II  TM Distance: >3 FB Neck ROM: full    Dental no notable dental hx.    Pulmonary neg pulmonary ROS, sleep apnea    Pulmonary exam normal breath sounds clear to auscultation       Cardiovascular Exercise Tolerance: Good hypertension, negative cardio ROS + dysrhythmias  Rhythm:regular Rate:Normal     Neuro/Psych  PSYCHIATRIC DISORDERS Anxiety Depression     Neuromuscular disease negative neurological ROS  negative psych ROS   GI/Hepatic negative GI ROS, Neg liver ROS,GERD  ,,  Endo/Other  negative endocrine ROSHypothyroidism    Renal/GU Renal diseasenegative Renal ROS  negative genitourinary   Musculoskeletal   Abdominal   Peds  Hematology negative hematology ROS (+) Blood dyscrasia, anemia   Anesthesia Other Findings   Reproductive/Obstetrics negative OB ROS                             Anesthesia Physical Anesthesia Plan  ASA: 3  Anesthesia Plan: General   Post-op Pain Management:    Induction:   PONV Risk Score and Plan: Propofol infusion  Airway Management Planned:   Additional Equipment:   Intra-op Plan:   Post-operative Plan:   Informed Consent: I have reviewed the patients History and Physical, chart, labs and discussed the procedure including the risks, benefits and alternatives for the proposed anesthesia with the patient or authorized representative who has indicated his/her understanding and acceptance.     Dental Advisory Given  Plan Discussed with: CRNA  Anesthesia Plan Comments:        Anesthesia Quick Evaluation

## 2023-04-01 NOTE — Anesthesia Procedure Notes (Signed)
Date/Time: 04/01/2023 11:46 AM  Performed by: Julian Reil, CRNAPre-anesthesia Checklist: Patient identified, Emergency Drugs available, Suction available and Patient being monitored Patient Re-evaluated:Patient Re-evaluated prior to induction Oxygen Delivery Method: Nasal cannula Induction Type: IV induction Placement Confirmation: positive ETCO2

## 2023-04-02 ENCOUNTER — Encounter (INDEPENDENT_AMBULATORY_CARE_PROVIDER_SITE_OTHER): Payer: Self-pay | Admitting: *Deleted

## 2023-04-02 LAB — SURGICAL PATHOLOGY

## 2023-04-02 NOTE — Anesthesia Postprocedure Evaluation (Signed)
Anesthesia Post Note  Patient: [name]  Procedure(s) Performed: ESOPHAGOGASTRODUODENOSCOPY (EGD) WITH PROPOFOL COLONOSCOPY WITH PROPOFOL BIOPSY POLYPECTOMY ESOPHAGEAL BRUSHING  Patient location during evaluation: Phase II Anesthesia Type: General Level of consciousness: awake Pain management: pain level controlled Vital Signs Assessment: post-procedure vital signs reviewed and stable Respiratory status: spontaneous breathing and respiratory function stable Cardiovascular status: blood pressure returned to baseline and stable Postop Assessment: no headache and no apparent nausea or vomiting Anesthetic complications: no Comments: Late entry   No notable events documented.   Last Vitals:  Vitals:   04/01/23 1257 04/01/23 1307  BP: (!) 99/59 108/63  Pulse: 69 68  Resp: 16 16  Temp:    SpO2: 100% 100%    Last Pain:  Vitals:   04/01/23 1307  TempSrc:   PainSc: 0-No pain                 Windell Norfolk

## 2023-04-04 ENCOUNTER — Encounter (INDEPENDENT_AMBULATORY_CARE_PROVIDER_SITE_OTHER): Payer: Self-pay | Admitting: *Deleted

## 2023-04-04 LAB — QUANTIFERON-TB GOLD PLUS (RQFGPL)
QuantiFERON Mitogen Value: >10 [IU]/mL
QuantiFERON Nil Value: 0.01 [IU]/mL
QuantiFERON TB1 Ag Value: 0.01 [IU]/mL
QuantiFERON TB2 Ag Value: 0.01 [IU]/mL

## 2023-04-04 LAB — QUANTIFERON-TB GOLD PLUS: QuantiFERON-TB Gold Plus: NEGATIVE

## 2023-04-07 DIAGNOSIS — I10 Essential (primary) hypertension: Secondary | ICD-10-CM | POA: Diagnosis not present

## 2023-04-09 ENCOUNTER — Encounter (HOSPITAL_COMMUNITY): Payer: Self-pay | Admitting: Gastroenterology

## 2023-04-09 ENCOUNTER — Other Ambulatory Visit: Payer: Self-pay | Admitting: Cardiology

## 2023-04-14 DIAGNOSIS — M25562 Pain in left knee: Secondary | ICD-10-CM | POA: Diagnosis not present

## 2023-04-15 ENCOUNTER — Encounter: Payer: Self-pay | Admitting: Gastroenterology

## 2023-04-15 ENCOUNTER — Ambulatory Visit: Payer: Medicare Other | Admitting: Gastroenterology

## 2023-04-15 VITALS — BP 111/61 | HR 82 | Temp 97.6°F | Ht 62.0 in | Wt 156.8 lb

## 2023-04-15 DIAGNOSIS — K529 Noninfective gastroenteritis and colitis, unspecified: Secondary | ICD-10-CM

## 2023-04-15 DIAGNOSIS — K2100 Gastro-esophageal reflux disease with esophagitis, without bleeding: Secondary | ICD-10-CM

## 2023-04-15 DIAGNOSIS — D508 Other iron deficiency anemias: Secondary | ICD-10-CM

## 2023-04-15 DIAGNOSIS — K5000 Crohn's disease of small intestine without complications: Secondary | ICD-10-CM

## 2023-04-15 DIAGNOSIS — R1310 Dysphagia, unspecified: Secondary | ICD-10-CM | POA: Diagnosis not present

## 2023-04-15 DIAGNOSIS — D649 Anemia, unspecified: Secondary | ICD-10-CM | POA: Diagnosis not present

## 2023-04-15 DIAGNOSIS — K5080 Crohn's disease of both small and large intestine without complications: Secondary | ICD-10-CM

## 2023-04-15 DIAGNOSIS — K219 Gastro-esophageal reflux disease without esophagitis: Secondary | ICD-10-CM

## 2023-04-15 MED ORDER — USTEKINUMAB 130 MG/26ML IV SOLN: 390.0000 mg | Freq: Once | INTRAVENOUS | 0 refills | Status: AC

## 2023-04-15 MED ORDER — STELARA 90 MG/ML ~~LOC~~ SOSY: 90.0000 mg | PREFILLED_SYRINGE | SUBCUTANEOUS | 5 refills | Status: DC

## 2023-04-15 NOTE — Progress Notes (Unsigned)
GI Office Note    Referring Provider: Ignatius Specking, MD Primary Care Physician:  Ignatius Specking, MD Primary Gastroenterologist: Dolores Frame, MD  Date:  04/16/2023  ID:  Jessica [name], DOB Nov 10, 1950, MRN [id number]   Chief Complaint   Chief Complaint  Patient presents with   Follow-up    Follow up. No problems    History of Present Illness  Jessica [name] is a 72 y.o. 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  Colonoscopy 06/08/2018: - The examined portion of the ileum was normal.  - Mucosal ulceration. Biopsied.  - Colonic fistula.  - Internal hemorrhoids. -pathology: small reactive changes but no active disease -Repeat colonoscopy in 5 years.  -Patient requested to not go any therapy unless she relapses.   Office visit 01/30/21.  Doing well.  Heartburn controlled pantoprazole.  Willing to drop her dose in the future due to risk of osteopenia.  Unsure when due for next bone density.  Denied any dysphagia, hoarseness, sore throat, chronic cough.  Has occasional diarrhea and accidents but mostly she has 2 formed stools.  Stop dicyclomine.  Denies any melena or bright BPR.  Appetite good.    Labs 12/04/22: Iron normal (103), Hgb 11.4, Ferritin 83 Labs 12/18/22: Hgb 10.5, MCV 98, plts 159, Cr 1.3, normal LFT. TSH wnl. Vit D 34.9  Last office visit 12/26/2022.  Some incontinence of urine and feces.  PCP recommended she be seen due to anemia.  Taking oral iron supplement daily.  Denied melena or BRBPR.  Stools normal though she is not having 5 to 6/day and usually moderate amount.  Taking Imodium once daily and Bentyl 1-2 times daily.  Gets constipated sometimes with taking iron.   Due to see Dr. Wolfgang Phoenix for her kidneys.  Still having fatigue.  Some mild dysphagia with food being stuck in her mid chest region, especially bread.  Using CPAP at night  Colonoscopy 04/01/23: -Crohn's disease with ileitis, congestion/edema, erosions, erythema found in the neoterminal ileum Rtugeerts Score i1 -Associated narrowing of the lumen s/p biopsy -Patent end-to-side colocolonic anastomosis with healthy-appearing mucosa -Three 4-12 mm polyps in the transverse colon -Nonbleeding internal hemorrhoids -Advised to check TB and hepatitis B surface antigen in anticipation of treatment with Stelara/Entyvio/Skyrizi -Pathology with ileal mucosa with focal erosion and active neutrophilic inflammation (NSAID versus Crohn's).  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    EGD 04/01/23: -LA grade C esophagitis with no bleeding s/p biopsy -KOH brushings obtained -4cm hiatal hernia -Normal stomach -Normal duodenum -GE junction biopsy with inflamed squamous mucosa with features consistent with severe reflux.  No yeast  Hep B surface antigen -neg. 04/01/2023.  TB Gold negative  Recent hemoglobin 11 on 03/18/23  Today:  Anemia - still with some fatigue  GERD - had an episode of emesis after eating [place] food. Ate some salsa that was spicy. Tacking pantoprazole twice daily. Still having some dysphagia with solid food.   Chron's - BM after ever meal. About 3 per day but has been tacking the imodium and dicyclomine. Helping with abdominal pain. No melena or brbpr.   EIM Joint - arthritis Skin - frequent bruising Eye - vision issues   Last flu shot: September 2024 Last pneumonia  shot: has had it Last zoster vaccine: Oct 2020 Last DEXA scan: [place] bone scan May 2022 COVID-19 shot: has had all of them (Moderna in 2021/2022) COVID booster september 2024    Current Outpatient Medications  Medication Sig Dispense Refill   amLODipine (NORVASC) 10 MG tablet Take 1 tablet by mouth  once daily 90 tablet 1   buPROPion (WELLBUTRIN XL) 150 MG 24 hr tablet Take 1 tablet (150 mg total) by mouth every morning. 30 tablet 2   dicyclomine (BENTYL) 10 MG capsule Take 1 capsule (10 mg total) by mouth 2 (two) times daily before a meal. 60 capsule 5   ferrous sulfate 325 (65 FE) MG EC tablet Take 1 tablet (325 mg total) by mouth daily with breakfast. 90 tablet 3   gabapentin (NEURONTIN) 300 MG capsule TAKE 1 CAPSULE BY MOUTH THREE TIMES DAILY 90 capsule 0   levothyroxine (SYNTHROID) 100 MCG tablet Take 100 mcg by mouth daily.     loperamide (IMODIUM) 2 MG capsule TAKE 1 CAPSULE BY MOUTH TWICE DAILY AS NEEDED FOR LOOSE STOOLS 180 capsule 1   metoprolol succinate (TOPROL-XL) 25 MG 24 hr tablet TAKE 1 & 1/2 (ONE & ONE-HALF) TABLETS BY MOUTH ONCE DAILY 45 tablet 3   Multiple Vitamin (MULTIVITAMIN WITH MINERALS) TABS tablet Take 1 tablet by mouth daily.     ondansetron (ZOFRAN-ODT) 4 MG disintegrating tablet Take 1 tablet (4 mg total) by mouth every 8 (eight) hours as needed. 20 tablet 0   pantoprazole (PROTONIX) 40 MG tablet Take 1 tablet (40 mg total) by mouth 2 (two) times daily. 180 tablet 3   potassium chloride (KLOR-CON) 10 MEQ tablet Take 10 mEq by mouth daily.     rOPINIRole (REQUIP) 0.5 MG tablet Take 0.5-1 mg by mouth in the morning and at bedtime. One in the morning, and two at night     rOPINIRole (REQUIP) 1 MG tablet Take 1 mg by mouth at bedtime.     rosuvastatin (CRESTOR) 10 MG tablet Take 1 tablet (10 mg total) by mouth daily. 90 tablet 2   spironolactone (ALDACTONE) 50 MG tablet Take 50 mg by mouth daily.     ustekinumab (STELARA) 90 MG/ML SOSY injection Inject 1 mL (90 mg total) into the skin every 8 (eight) weeks. 1 mL 5   hydrALAZINE (APRESOLINE) 50 MG tablet Take 50 mg by mouth 2 (two) times daily.     HYDROcodone-acetaminophen (NORCO/VICODIN) 5-325 MG tablet Take 1 tablet by mouth at bedtime.     sertraline (ZOLOFT) 100 MG tablet Take 1 tablet (100 mg total) by mouth  daily. (Patient not taking: Reported on 04/15/2023) 90 tablet 0   No current facility-administered medications for this visit.    Past Medical History:  Diagnosis Date   Anxiety attack    Arthritis    Back pain, chronic    Chronic kidney disease    Crohn's colitis (HCC)    Depression (emotion)    Dysrhythmia    GERD (gastroesophageal reflux disease)    Hypertension    Hypothyroid    Iron deficiency anemia, unspecified    Neuropathy    Sleep apnea    cpap is not working per patient - needs to find a new doctor - not used since 7/16 per pat    Stage 3 chronic kidney disease (HCC)    Vertigo    followed by Dr Angelena Sole- in [place]     Past Surgical History:  Procedure Laterality Date   ABDOMINAL HYSTERECTOMY  BIOPSY  06/08/2018   Procedure: BIOPSY;  Surgeon: Malissa Hippo, MD;  [facility];  Service: Endoscopy;;  (colon)   BIOPSY  04/01/2023   Procedure: BIOPSY;  Surgeon: Dolores Frame, MD;  [facility];  Service: Gastroenterology;;   CHOLECYSTECTOMY     COLON SURGERY     for Crohn's in the 47s in [place]   COLONOSCOPY N/A 03/11/2013   Procedure: COLONOSCOPY;  Surgeon: Malissa Hippo, MD;  [facility];  Service: Endoscopy;  Laterality: N/A;  1200   COLONOSCOPY N/A 06/08/2018   Procedure: COLONOSCOPY;  Surgeon: Malissa Hippo, MD;  [facility];  Service: Endoscopy;  Laterality: N/A;  8:25   COLONOSCOPY WITH PROPOFOL N/A 04/01/2023   Procedure: COLONOSCOPY WITH PROPOFOL;  Surgeon: Dolores Frame, MD;  [facility];  Service: Gastroenterology;  Laterality: N/A;   ESOPHAGEAL BRUSHING  04/01/2023   Procedure: ESOPHAGEAL BRUSHING;  Surgeon: Marguerita Merles, Reuel Boom, MD;  [facility];  Service: Gastroenterology;;   ESOPHAGOGASTRODUODENOSCOPY (EGD) WITH PROPOFOL N/A 04/01/2023   Procedure: ESOPHAGOGASTRODUODENOSCOPY (EGD) WITH PROPOFOL;  Surgeon: Dolores Frame, MD;  [facility];  Service: Gastroenterology;  Laterality: N/A;  1:00am;asa 3   fatty tumor removed     HARDWARE REMOVAL Right 08/07/2016   Procedure: HARDWARE REMOVAL;  Surgeon: Ollen Gross, MD;  Location: [facility];  Service: Orthopedics;  Laterality: Right;   Hip replacement      Rt hip in 2010 in [place]   KNEE ARTHROSCOPY Left 03/29/2015   Procedure: ARTHROSCOPY LEFT KNEE WITH MENSICAL DEBRIDEMENT, chondroplasty;  Surgeon: Ollen Gross, MD;  Location: [facility];  Service: Orthopedics;  Laterality: Left;   ORIF TIBIA PLATEAU Right 02/29/2016   Procedure: OPEN REDUCTION INTERNAL FIXATION (ORIF RIGHT  TIBIAL PLATEAU FRACTURE;  Surgeon: Ollen Gross, MD;  Location: [facility];  Service: Orthopedics;  Laterality: Right;   POLYPECTOMY  04/01/2023   Procedure: POLYPECTOMY;  Surgeon: Dolores Frame, MD;  [facility];  Service: Gastroenterology;;   TEE WITHOUT CARDIOVERSION N/A 03/27/2021   Procedure: TRANSESOPHAGEAL ECHOCARDIOGRAM (TEE);  Surgeon: Antoine Poche, MD;  [facility];  Service: Endoscopy;  Laterality: N/A;   TOTAL KNEE ARTHROPLASTY Right 08/25/2017   Procedure: TOTAL RIGHT KNEE ARTHROPLASTY;  Surgeon: Ollen Gross, MD;  [facility];  Service: Orthopedics;  Laterality: Right;    Family History  Problem Relation Age of Onset   Crohn's disease Brother    Depression Mother    Anxiety disorder Mother    Depression Sister    Aneurysm Father    Colon cancer Neg Hx     Allergies as of 04/15/2023 - Review Complete 04/15/2023  Allergen Reaction Noted   Morphine and codeine Hives and Itching 02/10/2013   Robaxin [methocarbamol] Itching 07/29/2016   Sulfa antibiotics Itching 07/29/2016    Social History   Socioeconomic History   Marital status: Married    Spouse name: Not on file   Number of children: Not on file   Years of education: Not on file   Highest education level: Not on file  Occupational History   Not on file  Tobacco Use   Smoking  status: Never   Smokeless tobacco: Never  Vaping Use   Vaping status: Never Used  Substance and Sexual Activity   Alcohol use: No    Alcohol/week: 0.0 standard drinks of alcohol   Drug use: No   Sexual activity: Not Currently  Other Topics  Concern   Not on file  Social History Narrative   Not on file   Social Determinants of Health   Financial Resource Strain: Not on file  Food Insecurity: Not on file  Transportation Needs: Not on file  Physical Activity: Not on file  Stress: Not on file  Social Connections: Not on file    Review of Systems   Gen: Denies fever, chills, anorexia. Denies fatigue, weakness, weight loss.  CV: Denies chest pain, palpitations, syncope, peripheral edema, and claudication. Resp: Denies dyspnea at rest, cough, wheezing, coughing up blood, and pleurisy. GI: See HPI Derm: Denies rash, itching, dry skin Psych: Denies depression, anxiety, memory loss, confusion. No homicidal or suicidal ideation.  Heme: Denies bruising, bleeding, and enlarged lymph nodes. + joint pain  Physical Exam   BP 111/61 (BP Location: Right Arm, Patient Position: Sitting, Cuff Size: Normal)   Pulse 82   Temp 97.6 F (36.4 C) (Temporal)   Ht 5\' 2"  (1.575 m)   Wt 156 lb 12.8 oz (71.1 kg)   SpO2 100%   BMI 28.68 kg/m   General:   Alert and oriented. No distress noted. Pleasant and cooperative.  Head:  Normocephalic and atraumatic. Eyes:  Conjuctiva clear without scleral icterus. Mouth:  Oral mucosa pink and moist. Good dentition. No lesions. Lungs:  Clear to auscultation bilaterally. No wheezes, rales, or rhonchi. No distress.  Heart:  S1, S2 present without murmurs appreciated.  Abdomen:  +BS, soft, non-tender and non-distended. No rebound or guarding. No HSM or masses noted. Rectal: deferred Msk: Brace to left knee.  Mild limp in her gait. Extremities:  Without edema. Neurologic:  Alert and  oriented x4 Psych:  Alert and cooperative. Normal mood and  affect.  Assessment  Jessica [name] is a 72 y.o. female with a history of small bowel Crohn's with right hemicolectomy (previously on Lialda, not currently on any medical therapy) and presence of pinhole fistula on colonoscopy in 2019, HTN, chronic GERD, hypothyroidism, CKD stage III, and OSA presenting today for follow-up post colonoscopy to discuss starting on therapy for active Crohn's.  Chron's ileitis: History of small bowel Crohn's with potential right sided colitis.  Has underwent right hemicolectomy in the past and recently not on any medical therapy.  Has been on Lialda in the past but no medication after hemicolectomy.  Has had looser stools/mild chronic diarrhea since right hemicolectomy and removal of ileocecal valve.  Recently experiencing recurrence of anemia and has continued to have 5-6 looser stools per day on average.  Recently given Bentyl to help with this and taking Imodium as needed which has been helpful.  Colonoscopy recently updated revealing active ileitis with some luminal narrowing but with patent and healthy-appearing end-to-side colocolonic anastomosis.  It was recommended that she resume treatment.  Dr. Levon Hedger recommended considering Sydnee Cabal, or [place].  Discussed in detail the medications today and provided written education regarding monitoring, potential side effects, etc.  Given convenience patient would like to try Stelara therefore we will submit this to insurance.  Labs negative for TB and hep B.  Depending on cost may need to try different alternative.  May need to have discussion with her insurance company regarding affordable treatment option.  Anemia: Hemoglobin was normal earlier this year however has had a decrease in her hemoglobin since June with normocytic indices.  Her anemia is likely multifactorial in the setting of CKD as well as confirmed recurrence of Crohn's.  Colonoscopy recently confirmed active disease within the neoterminal ileum  with some  luminal narrowing.  Recent EGD with reflux esophagitis.  Will continue on pantoprazole twice daily.  GERD, dysphagia: Recent episode of emesis after eating [place] food events also with spicy.  Still having some mild dysphagia with solid food but not frequently.  Otherwise fairly well-controlled with pantoprazole twice daily.  She did have evidence of esophagitis on EGD.  Will need to continue PPI therapy.  Given esophagitis it was recommended for her to have a repeat EGD in 3 months.  Will consider possible dilation as well given reports of dysphagia.  PLAN   Discussed treatment options, patient would like to try stelara if covered by insurance Repeat EGD +/- ED in 3 months with Dr. Levon Hedger.  ASA 3 Start process for Stelara For now can continue imodium and bentyl, wean after starting Stelara. Continue daily iron therapy Check labs in 2-3 months after starting therapy Follow up in 3 months.   Brooke Bonito, MSN, FNP-BC, AGACNP-BC [facility]  I have reviewed the note and agree with the APP's assessment as described in this progress note  Katrinka Blazing, MD Gastroenterology and Hepatology [facility]

## 2023-04-15 NOTE — Patient Instructions (Signed)
We will start the process for Stelara and check to see if this is covered by her insurance.  We will plan to repeat your upper endoscopy at the end of December/early January.  Now continue Imodium and Bentyl as needed.  We will start weaning this after starting your Stelara.  Continue taking oral iron once daily  Will plan to see you in the office in 3 months, sooner if needed.  It was a pleasure to see you today. I want to create trusting relationships with patients. If you receive a survey regarding your visit,  I greatly appreciate you taking time to fill this out on paper or through your MyChart. I value your feedback.  Brooke Bonito, MSN, FNP-BC, AGACNP-BC [facility]

## 2023-04-16 ENCOUNTER — Telehealth: Payer: Self-pay | Admitting: *Deleted

## 2023-04-16 ENCOUNTER — Other Ambulatory Visit: Payer: Self-pay

## 2023-04-16 DIAGNOSIS — D509 Iron deficiency anemia, unspecified: Secondary | ICD-10-CM

## 2023-04-16 NOTE — Progress Notes (Signed)
Lab orders entered

## 2023-04-16 NOTE — Telephone Encounter (Signed)
Stelara was approved, but copay is 3000$. Need to find something cheaper that insurance will cover.

## 2023-04-16 NOTE — Telephone Encounter (Signed)
Received approval for Stelara. Approval letter scanned into chart.

## 2023-04-17 ENCOUNTER — Inpatient Hospital Stay: Payer: Medicare Other | Attending: Hematology

## 2023-04-17 ENCOUNTER — Encounter: Payer: Self-pay | Admitting: Gastroenterology

## 2023-04-17 ENCOUNTER — Telehealth: Payer: Self-pay

## 2023-04-17 DIAGNOSIS — Z818 Family history of other mental and behavioral disorders: Secondary | ICD-10-CM | POA: Diagnosis not present

## 2023-04-17 DIAGNOSIS — D509 Iron deficiency anemia, unspecified: Secondary | ICD-10-CM

## 2023-04-17 DIAGNOSIS — Z8249 Family history of ischemic heart disease and other diseases of the circulatory system: Secondary | ICD-10-CM | POA: Insufficient documentation

## 2023-04-17 DIAGNOSIS — Z90710 Acquired absence of both cervix and uterus: Secondary | ICD-10-CM | POA: Insufficient documentation

## 2023-04-17 DIAGNOSIS — D649 Anemia, unspecified: Secondary | ICD-10-CM | POA: Diagnosis not present

## 2023-04-17 DIAGNOSIS — Z9049 Acquired absence of other specified parts of digestive tract: Secondary | ICD-10-CM | POA: Insufficient documentation

## 2023-04-17 DIAGNOSIS — K2100 Gastro-esophageal reflux disease with esophagitis, without bleeding: Secondary | ICD-10-CM | POA: Insufficient documentation

## 2023-04-17 DIAGNOSIS — K648 Other hemorrhoids: Secondary | ICD-10-CM | POA: Insufficient documentation

## 2023-04-17 DIAGNOSIS — G4730 Sleep apnea, unspecified: Secondary | ICD-10-CM | POA: Insufficient documentation

## 2023-04-17 DIAGNOSIS — Z8673 Personal history of transient ischemic attack (TIA), and cerebral infarction without residual deficits: Secondary | ICD-10-CM | POA: Insufficient documentation

## 2023-04-17 DIAGNOSIS — Z885 Allergy status to narcotic agent status: Secondary | ICD-10-CM | POA: Diagnosis not present

## 2023-04-17 DIAGNOSIS — R079 Chest pain, unspecified: Secondary | ICD-10-CM | POA: Diagnosis not present

## 2023-04-17 DIAGNOSIS — K5090 Crohn's disease, unspecified, without complications: Secondary | ICD-10-CM | POA: Insufficient documentation

## 2023-04-17 DIAGNOSIS — R1310 Dysphagia, unspecified: Secondary | ICD-10-CM | POA: Insufficient documentation

## 2023-04-17 DIAGNOSIS — Z882 Allergy status to sulfonamides status: Secondary | ICD-10-CM | POA: Insufficient documentation

## 2023-04-17 DIAGNOSIS — G8929 Other chronic pain: Secondary | ICD-10-CM | POA: Diagnosis not present

## 2023-04-17 DIAGNOSIS — Z79899 Other long term (current) drug therapy: Secondary | ICD-10-CM | POA: Diagnosis not present

## 2023-04-17 DIAGNOSIS — Z8379 Family history of other diseases of the digestive system: Secondary | ICD-10-CM | POA: Diagnosis not present

## 2023-04-17 DIAGNOSIS — Z79890 Hormone replacement therapy (postmenopausal): Secondary | ICD-10-CM | POA: Insufficient documentation

## 2023-04-17 DIAGNOSIS — M2550 Pain in unspecified joint: Secondary | ICD-10-CM | POA: Diagnosis not present

## 2023-04-17 DIAGNOSIS — R233 Spontaneous ecchymoses: Secondary | ICD-10-CM | POA: Diagnosis not present

## 2023-04-17 LAB — CBC WITH DIFFERENTIAL/PLATELET
Abs Immature Granulocytes: 0.01 10*3/uL (ref 0.00–0.07)
Basophils Absolute: 0 10*3/uL (ref 0.0–0.1)
Basophils Relative: 0 %
Eosinophils Absolute: 0 10*3/uL (ref 0.0–0.5)
Eosinophils Relative: 0 %
HCT: 33.4 % — ABNORMAL LOW (ref 36.0–46.0)
Hemoglobin: 11.2 g/dL — ABNORMAL LOW (ref 12.0–15.0)
Immature Granulocytes: 0 %
Lymphocytes Relative: 13 %
Lymphs Abs: 0.7 10*3/uL (ref 0.7–4.0)
MCH: 32.4 pg (ref 26.0–34.0)
MCHC: 33.5 g/dL (ref 30.0–36.0)
MCV: 96.5 fL (ref 80.0–100.0)
Monocytes Absolute: 0.3 10*3/uL (ref 0.1–1.0)
Monocytes Relative: 6 %
Neutro Abs: 4.5 10*3/uL (ref 1.7–7.7)
Neutrophils Relative %: 81 %
Platelets: 144 10*3/uL — ABNORMAL LOW (ref 150–400)
RBC: 3.46 MIL/uL — ABNORMAL LOW (ref 3.87–5.11)
RDW: 12.2 % (ref 11.5–15.5)
WBC: 5.6 10*3/uL (ref 4.0–10.5)
nRBC: 0 % (ref 0.0–0.2)

## 2023-04-17 LAB — IRON AND TIBC
Iron: 138 ug/dL (ref 28–170)
Saturation Ratios: 52 % — ABNORMAL HIGH (ref 10.4–31.8)
TIBC: 265 ug/dL (ref 250–450)
UIBC: 127 ug/dL

## 2023-04-17 LAB — COMPREHENSIVE METABOLIC PANEL
ALT: 17 U/L (ref 0–44)
AST: 23 U/L (ref 15–41)
Albumin: 4.1 g/dL (ref 3.5–5.0)
Alkaline Phosphatase: 63 U/L (ref 38–126)
Anion gap: 7 (ref 5–15)
BUN: 24 mg/dL — ABNORMAL HIGH (ref 8–23)
CO2: 20 mmol/L — ABNORMAL LOW (ref 22–32)
Calcium: 8.9 mg/dL (ref 8.9–10.3)
Chloride: 107 mmol/L (ref 98–111)
Creatinine, Ser: 1.38 mg/dL — ABNORMAL HIGH (ref 0.44–1.00)
GFR, Estimated: 41 mL/min — ABNORMAL LOW (ref >60)
Glucose, Bld: 101 mg/dL — ABNORMAL HIGH (ref 70–99)
Potassium: 4.8 mmol/L (ref 3.5–5.1)
Sodium: 134 mmol/L — ABNORMAL LOW (ref 135–145)
Total Bilirubin: 0.7 mg/dL (ref 0.3–1.2)
Total Protein: 6.8 g/dL (ref 6.5–8.1)

## 2023-04-17 LAB — FERRITIN: Ferritin: 366 ng/mL — ABNORMAL HIGH (ref 11–307)

## 2023-04-17 NOTE — Telephone Encounter (Signed)
Spoke to pt, informed her of recommendations. She states she will come by later today or tomorrow and sign papers.

## 2023-04-17 NOTE — Addendum Note (Signed)
Addended by: Aida Raider on: 04/17/2023 08:08 AM   Modules accepted: Orders

## 2023-04-17 NOTE — Telephone Encounter (Signed)
Hello,  Patient will be scheduled as soon as possible.  Auth Submission: NO AUTH NEEDED Site of care: Site of care: [facility] Payer: medicare a/b, [employer] [place] supp Medication & CPT/J Code(s) submitted: Stelara Infusion (Ustekinumab) J3358 Route of submission (phone, fax, portal): phone Phone # Fax # Auth type: Buy/Bill HB Units/visits requested: 390mg , 1 dose Reference number:  Approval from: 04/17/23 to 07/08/23   No auth needed for induction dose, Please have your office follow up for self admin maintenance dosing.   Thanks

## 2023-04-17 NOTE — Telephone Encounter (Signed)
Okay, just let me know if and when we need to schedule her. Thanks

## 2023-04-18 ENCOUNTER — Telehealth: Payer: Self-pay | Admitting: *Deleted

## 2023-04-18 NOTE — Telephone Encounter (Signed)
Pt and husband called, states that he checked into medications for Crohn's and there insurance should cover Skyrizi at a cheaper cost then [employer]. They also would like for you to check into Humira to see if that would be cheaper. You can send them a MyChart message.

## 2023-04-21 ENCOUNTER — Other Ambulatory Visit: Payer: Self-pay | Admitting: Gastroenterology

## 2023-04-21 MED ORDER — SKYRIZI 180 MG/1.2ML ~~LOC~~ SOCT: 180.0000 mg | SUBCUTANEOUS | 6 refills | Status: DC

## 2023-04-21 NOTE — Telephone Encounter (Signed)
Spoke to pt, she informed me that husband and her discussed trying the [employer]. Wants to know how much it would be? Informed has to be sent to pharmacy first and then will determine pricing.

## 2023-04-21 NOTE — Telephone Encounter (Signed)
Noted  

## 2023-04-21 NOTE — Telephone Encounter (Signed)
Brooke Bonito, NP, I believe this was meant for you.

## 2023-04-22 NOTE — Telephone Encounter (Signed)
Noted  

## 2023-04-22 NOTE — Telephone Encounter (Signed)
No auth would be needed for patient's Skyrizi infusion. Medicare a/b will cover 80% and her supplement would pick up remaining 20%.

## 2023-04-24 ENCOUNTER — Encounter: Payer: Self-pay | Admitting: *Deleted

## 2023-04-24 ENCOUNTER — Inpatient Hospital Stay: Payer: Medicare Other | Admitting: Oncology

## 2023-04-24 VITALS — BP 113/67 | HR 57 | Temp 98.3°F | Resp 18 | Wt 154.3 lb

## 2023-04-24 DIAGNOSIS — D649 Anemia, unspecified: Secondary | ICD-10-CM | POA: Diagnosis not present

## 2023-04-24 DIAGNOSIS — R233 Spontaneous ecchymoses: Secondary | ICD-10-CM

## 2023-04-24 DIAGNOSIS — K5000 Crohn's disease of small intestine without complications: Secondary | ICD-10-CM | POA: Diagnosis not present

## 2023-04-24 DIAGNOSIS — R1310 Dysphagia, unspecified: Secondary | ICD-10-CM | POA: Diagnosis not present

## 2023-04-24 DIAGNOSIS — D509 Iron deficiency anemia, unspecified: Secondary | ICD-10-CM

## 2023-04-24 DIAGNOSIS — K5090 Crohn's disease, unspecified, without complications: Secondary | ICD-10-CM | POA: Diagnosis not present

## 2023-04-24 DIAGNOSIS — M2550 Pain in unspecified joint: Secondary | ICD-10-CM | POA: Diagnosis not present

## 2023-04-24 DIAGNOSIS — G8929 Other chronic pain: Secondary | ICD-10-CM | POA: Diagnosis not present

## 2023-04-24 NOTE — Progress Notes (Signed)
[name] [address], [place] [address]   Clinic Day:  04/24/23   Referring physician: Ignatius Specking, MD  Patient Care Team: Jessica Specking, MD as PCP - General (Internal Medicine) Jessica [name] Jessica Pea, MD as PCP - Cardiology (Cardiology) Jessica Gross, MD as Consulting Physician (Orthopedic Surgery)   ASSESSMENT & PLAN:   Assessment:  1.  Easy bruising and normocytic anemia: - She reports easy bruising for 5 years, worse lately on the upper extremities.  No nosebleeds or other bleeding reported.  She had right knee surgery 2 years ago and did not have any extra bleeding.  Denies any B symptoms.  She has been on Zoloft for 15 years.  Aspirin 81 mg daily was started 1 year ago.  She does get steroid injections for her back and feet almost every 3 months for a long time. - She denies any bleeding per rectum or melena.  No ice pica. - She has Crohn's disease but is not on any active therapy. - Colonoscopy (06/08/2018): 3 mm fistula at the anastomosis.  Normal ileum.  Internal hemorrhoids.  Mucosal ulceration.  2. Social/Family history: -She lives with husband at home.  She retired after working at [employer].  She also did other factory work.  No tobacco use or chemical exposures.  -No family history of bleeding issues. No family history of cancer. Brother has Crohn's.   Plan:  1.  Easy bruising: -Platelet count has been intermittently low since 2019 ranging from 107-196.  - Labs from 04/17/2023 show a platelet count of 144. -Previous hematology workup included normal PT, PTT and fibrinogen level.  Von Willebrand panel was slightly elevated secondary to chronic inflammation.  No evidence of hemolysis or infection. -Given active Crohn's flare, will hold off on additional PFA-100 testing given platelet counts have somewhat stabilized at 144,000 even with improvement to her iron levels. -Will continue to monitor.   2.  Normocytic  anemia: - Normocytic anemia likely multifactorial in the setting of CKD, iron deficiency and history of Crohn's disease. -Hematology workup from 02/12/2023 did not reveal evidence of infiltrative process with no evidence of M spike or hemolysis. - Colonoscopy/EGD from 04/01/2023 revealed known Crohn's disease with ileitis.  No inflammation was found.  Three 4 to 12 mm polyps in transverse colon that were removed.  Nonbleeding internal hemorrhoids.  EGD showed esophagitis without bleeding, 4 cm hiatal hernia with otherwise normal stomach and duodenum.  She was started on Protonix. -She received 1 g Monoferric on 03/14/2023 with good tolerance. -Labs from 04/17/2023 revealed a ferritin of 366, iron saturation 52%, hemoglobin 11.2 (11.6) and platelet count of 144,000. -No additional IV iron needed at this time.  3.  Crohn's disease: -Has had intermittent flares over the years but not an active flare lasting this long.  Has taken oral medications in the past such as steroids and Lyalda.  Nothing recently. -Most recent colonoscopy from 04/01/2023 showed Crohn's disease with ileitis, congestion/edema, erosion, erythema found in neoterminal ileum with associated narrowing of the lumen status post biopsy.  Pathology showed active neutrophilic inflammation.  -She is scheduled to begin Stelara infusions for active Crohn's disease in the next couple of weeks.  Other medications were too expensive per husband. -She will have repeat EGD/colonoscopy in 3 months with Dr. Levon Hedger.  -She was instructed to continue Imodium and Bentyl and daily iron supplement.  No orders of the defined types were placed in this encounter.  PLAN  SUMMARY: >> Continue follow-up per GI/PCP for Crohn's disease. >> No additional IV iron needed at this time. >> Return to clinic in 8 weeks for follow-up with labs a few days before.     I spent 35 minutes dedicated to the care of this patient (face-to-face and non-face-to-face) on the date  of the encounter to include what is described in the assessment and plan.    Jessica Kaufmann, NP   10/17/202412:55 PM  CHIEF COMPLAINT/PURPOSE OF CONSULT:   Diagnosis: Normocytic anemia, easy bruising  Current Therapy: Under workup  HISTORY OF PRESENT ILLNESS:   Jessica [name] is a 72 y.o. female presenting to clinic today for follow-up for anemia and easy bruising.  She was last seen in clinic on 03/13/2023.  She received 1 dose of Monoferric 1g on 03/14/2023.  She tolerated this well.  Reports feeling better since IV iron with increasing energy levels and appetite although now she has developed diarrhea secondary to active Crohn's flare.  She is having 5-6 loose BMs daily typically after meals.  Reports some abdominal discomfort, nausea and vomiting.  Recently had a colonoscopy and EGD which showed active Crohn's.  She will be starting Stelara infusions in the upcoming weeks.  She continues to take iron supplements.  Has chronic joint pain but this is stable.  Reports she is taking Bentyl and Imodium for diarrhea as needed.  She is closely monitored by her primary care doctor and GI.  In the interim, she was evaluated in the ED on 03/18/2023 for chest pain and dysphagia.  Exam was unremarkable and she was given IV fluids and Reglan.  Denies any bright red blood per rectum, melena or hematochezia.   She is currently on aspirin 81 mg daily secondary to a TIA last year.   PAST MEDICAL HISTORY:   Past Medical History: Past Medical History:  Diagnosis Date   Anxiety attack    Arthritis    Back pain, chronic    Chronic kidney disease    Crohn's colitis (HCC)    Depression (emotion)    Dysrhythmia    GERD (gastroesophageal reflux disease)    Hypertension    Hypothyroid    Iron deficiency anemia, unspecified    Neuropathy    Sleep apnea    cpap is not working per patient - needs to find a new doctor - not used since 7/16 per pat    Stage 3 chronic kidney disease (HCC)    Vertigo     followed by Dr Angelena Sole- in [place]     Surgical History: Past Surgical History:  Procedure Laterality Date   ABDOMINAL HYSTERECTOMY     BIOPSY  06/08/2018   Procedure: BIOPSY;  Surgeon: Malissa Hippo, MD;  Location: [facility];  Service: Endoscopy;;  (colon)   BIOPSY  04/01/2023   Procedure: BIOPSY;  Surgeon: Dolores Frame, MD;  [facility]: [facility];  Service: Gastroenterology;;   CHOLECYSTECTOMY     COLON SURGERY     for Crohn's in the 85s in [place]   COLONOSCOPY N/A 03/11/2013   Procedure: COLONOSCOPY;  Surgeon: Malissa Hippo, MD;  [facility]: [facility];  Service: Endoscopy;  Laterality: N/A;  1200   COLONOSCOPY N/A 06/08/2018   Procedure: COLONOSCOPY;  Surgeon: Malissa Hippo, MD;  [facility]: [facility];  Service: Endoscopy;  Laterality: N/A;  8:25   COLONOSCOPY WITH PROPOFOL N/A 04/01/2023   Procedure: COLONOSCOPY WITH PROPOFOL;  Surgeon: Dolores Frame, MD;  [facility]: [facility];  Service: Gastroenterology;  Laterality: N/A;   ESOPHAGEAL BRUSHING  04/01/2023   Procedure: ESOPHAGEAL BRUSHING;  Surgeon: Marguerita Merles, Reuel Boom, MD;  [facility]: [facility];  Service: Gastroenterology;;   ESOPHAGOGASTRODUODENOSCOPY (EGD) WITH PROPOFOL N/A 04/01/2023   Procedure: ESOPHAGOGASTRODUODENOSCOPY (EGD) WITH PROPOFOL;  Surgeon: Dolores Frame, MD;  [facility]: [facility];  Service: Gastroenterology;  Laterality: N/A;  1:00am;asa 3   fatty tumor removed     HARDWARE REMOVAL Right 08/07/2016   Procedure: HARDWARE REMOVAL;  Surgeon: Jessica Gross, MD;  Location: [facility];  Service: Orthopedics;  Laterality: Right;   Hip replacement      Rt hip in 2010 in [place]   KNEE ARTHROSCOPY Left 03/29/2015   Procedure: ARTHROSCOPY LEFT KNEE WITH MENSICAL DEBRIDEMENT, chondroplasty;  Surgeon: Jessica Gross, MD;  Location: [facility];  Service: Orthopedics;  Laterality: Left;   ORIF TIBIA PLATEAU Right 02/29/2016   Procedure: OPEN REDUCTION INTERNAL FIXATION (ORIF  RIGHT  TIBIAL PLATEAU FRACTURE;  Surgeon: Jessica Gross, MD;  Location: [facility];  Service: Orthopedics;  Laterality: Right;   POLYPECTOMY  04/01/2023   Procedure: POLYPECTOMY;  Surgeon: Dolores Frame, MD;  [facility];  Service: Gastroenterology;;   TEE WITHOUT CARDIOVERSION N/A 03/27/2021   Procedure: TRANSESOPHAGEAL ECHOCARDIOGRAM (TEE);  Surgeon: Antoine Poche, MD;  [facility];  Service: Endoscopy;  Laterality: N/A;   TOTAL KNEE ARTHROPLASTY Right 08/25/2017   Procedure: TOTAL RIGHT KNEE ARTHROPLASTY;  Surgeon: Jessica Gross, MD;  Location: [facility];  Service: Orthopedics;  Laterality: Right;    Social History: Social History   Socioeconomic History   Marital status: Married    Spouse name: Not on file   Number of children: Not on file   Years of education: Not on file   Highest education level: Not on file  Occupational History   Not on file  Tobacco Use   Smoking status: Never   Smokeless tobacco: Never  Vaping Use   Vaping status: Never Used  Substance and Sexual Activity   Alcohol use: No    Alcohol/week: 0.0 standard drinks of alcohol   Drug use: No   Sexual activity: Not Currently  Other Topics Concern   Not on file  Social History Narrative   Not on file   Social Determinants of Health   Financial Resource Strain: Not on file  Food Insecurity: Not on file  Transportation Needs: Not on file  Physical Activity: Not on file  Stress: Not on file  Social Connections: Not on file  Intimate Partner Violence: Not on file    Family History: Family History  Problem Relation Age of Onset   Crohn's disease Brother    Depression Mother    Anxiety disorder Mother    Depression Sister    Aneurysm Father    Colon cancer Neg Hx     Current Medications:  Current Outpatient Medications:    amLODipine (NORVASC) 10 MG tablet, Take 1 tablet by mouth once daily, Disp: 90 tablet, Rfl: 1   buPROPion (WELLBUTRIN XL) 150 MG 24 hr  tablet, Take 1 tablet (150 mg total) by mouth every morning., Disp: 30 tablet, Rfl: 2   dicyclomine (BENTYL) 10 MG capsule, Take 1 capsule (10 mg total) by mouth 2 (two) times daily before a meal., Disp: 60 capsule, Rfl: 5   ferrous sulfate 325 (65 FE) MG EC tablet, Take 1 tablet (325 mg total) by mouth daily with breakfast., Disp: 90 tablet, Rfl: 3   gabapentin (NEURONTIN) 300 MG capsule,  TAKE 1 CAPSULE BY MOUTH THREE TIMES DAILY, Disp: 90 capsule, Rfl: 0   HYDROcodone-acetaminophen (NORCO/VICODIN) 5-325 MG tablet, Take 1 tablet by mouth at bedtime., Disp: , Rfl:    levothyroxine (SYNTHROID) 100 MCG tablet, Take 100 mcg by mouth daily., Disp: , Rfl:    loperamide (IMODIUM) 2 MG capsule, TAKE 1 CAPSULE BY MOUTH TWICE DAILY AS NEEDED FOR LOOSE STOOLS, Disp: 180 capsule, Rfl: 1   metoprolol succinate (TOPROL-XL) 25 MG 24 hr tablet, TAKE 1 & 1/2 (ONE & ONE-HALF) TABLETS BY MOUTH ONCE DAILY, Disp: 45 tablet, Rfl: 3   Multiple Vitamin (MULTIVITAMIN WITH MINERALS) TABS tablet, Take 1 tablet by mouth daily., Disp: , Rfl:    ondansetron (ZOFRAN-ODT) 4 MG disintegrating tablet, Take 1 tablet (4 mg total) by mouth every 8 (eight) hours as needed., Disp: 20 tablet, Rfl: 0   pantoprazole (PROTONIX) 40 MG tablet, Take 1 tablet (40 mg total) by mouth 2 (two) times daily., Disp: 180 tablet, Rfl: 3   potassium chloride (KLOR-CON) 10 MEQ tablet, Take 10 mEq by mouth daily., Disp: , Rfl:    Risankizumab-rzaa (SKYRIZI) 180 MG/1.2ML SOCT, Inject 180 mg into the skin every 8 (eight) weeks. Start 4 weeks after 3rd induction dose., Disp: 1.2 mL, Rfl: 6   rOPINIRole (REQUIP) 0.5 MG tablet, Take 0.5-1 mg by mouth in the morning and at bedtime. One in the morning, and two at night, Disp: , Rfl:    rOPINIRole (REQUIP) 1 MG tablet, Take 1 mg by mouth at bedtime., Disp: , Rfl:    rosuvastatin (CRESTOR) 10 MG tablet, Take 1 tablet (10 mg total) by mouth daily., Disp: 90 tablet, Rfl: 2   spironolactone (ALDACTONE) 50 MG tablet,  Take 50 mg by mouth daily., Disp: , Rfl:    Allergies: Allergies  Allergen Reactions   Morphine And Codeine Hives and Itching    May cause blood pressure to drop   Robaxin [Methocarbamol] Itching   Sulfa Antibiotics Itching    ALL SULFA DRUGS    REVIEW OF SYSTEMS:   Review of Systems  Constitutional:  Positive for fatigue.  HENT:   Positive for trouble swallowing.   Respiratory:  Positive for shortness of breath.   Gastrointestinal:  Positive for diarrhea, nausea and vomiting.  Neurological:  Positive for dizziness.  Psychiatric/Behavioral:  Positive for sleep disturbance. The patient is nervous/anxious.      VITALS:   Blood pressure 113/67, pulse (!) 57, temperature 98.3 F (36.8 C), temperature source Oral, resp. rate 18, weight 154 lb 4.8 oz (70 kg), SpO2 100%.  Wt Readings from Last 3 Encounters:  04/24/23 154 lb 4.8 oz (70 kg)  04/15/23 156 lb 12.8 oz (71.1 kg)  04/01/23 158 lb 11.7 oz (72 kg)    Body mass index is 28.22 kg/m.   PHYSICAL EXAM:   Physical Exam Constitutional:      Appearance: Normal appearance.  Cardiovascular:     Rate and Rhythm: Normal rate and regular rhythm.  Pulmonary:     Effort: Pulmonary effort is normal.     Breath sounds: Normal breath sounds.  Abdominal:     General: Bowel sounds are normal.     Palpations: Abdomen is soft.  Musculoskeletal:        General: No swelling. Normal range of motion.  Neurological:     Mental Status: She is alert and oriented to person, place, and time. Mental status is at baseline.     LABS:      Latest Ref Rng &  Units 04/17/2023   10:40 AM 03/18/2023    6:12 PM 02/27/2023   12:18 AM  CBC  WBC 4.0 - 10.5 K/uL 5.6  7.7  5.2   Hemoglobin 12.0 - 15.0 g/dL 16.1  09.6  04.5   Hematocrit 36.0 - 46.0 % 33.4  32.6  30.3   Platelets 150 - 400 K/uL 144  123  108       Latest Ref Rng & Units 04/17/2023   10:40 AM 03/18/2023    6:12 PM 02/27/2023   12:18 AM  CMP  Glucose 70 - 99 mg/dL 409  88  811    BUN 8 - 23 mg/dL 24  21  18    Creatinine 0.44 - 1.00 mg/dL 9.14  7.82  9.56   Sodium 135 - 145 mmol/L 134  133  136   Potassium 3.5 - 5.1 mmol/L 4.8  4.3  3.8   Chloride 98 - 111 mmol/L 107  106  105   CO2 22 - 32 mmol/L 20  17  22    Calcium 8.9 - 10.3 mg/dL 8.9  9.5  8.6   Total Protein 6.5 - 8.1 g/dL 6.8  6.8  6.2   Total Bilirubin 0.3 - 1.2 mg/dL 0.7  0.9  0.7   Alkaline Phos 38 - 126 U/L 63  68  74   AST 15 - 41 U/L 23  31  23    ALT 0 - 44 U/L 17  18  14       No results found for: "CEA1", "CEA" / No results found for: "CEA1", "CEA" No results found for: "PSA1" No results found for: "CAN199" No results found for: "CAN125"  Lab Results  Component Value Date   TOTALPROTELP 7.0 02/12/2023   TOTALPROTELP 7.0 02/12/2023   ALBUMINELP 4.0 02/12/2023   A1GS 0.2 02/12/2023   A2GS 0.7 02/12/2023   BETS 1.1 02/12/2023   GAMS 1.0 02/12/2023   MSPIKE Not Observed 02/12/2023   SPEI Comment 02/12/2023   Lab Results  Component Value Date   TIBC 265 04/17/2023   TIBC 439 02/12/2023   FERRITIN 366 (H) 04/17/2023   FERRITIN 31 02/12/2023   IRONPCTSAT 52 (H) 04/17/2023   IRONPCTSAT 21 02/12/2023   Lab Results  Component Value Date   LDH 159 02/12/2023     STUDIES:   No results found.

## 2023-04-25 DIAGNOSIS — M25562 Pain in left knee: Secondary | ICD-10-CM | POA: Diagnosis not present

## 2023-04-26 DIAGNOSIS — E663 Overweight: Secondary | ICD-10-CM | POA: Diagnosis not present

## 2023-04-26 DIAGNOSIS — J101 Influenza due to other identified influenza virus with other respiratory manifestations: Secondary | ICD-10-CM | POA: Diagnosis not present

## 2023-04-26 DIAGNOSIS — J019 Acute sinusitis, unspecified: Secondary | ICD-10-CM | POA: Diagnosis not present

## 2023-04-26 DIAGNOSIS — U071 COVID-19: Secondary | ICD-10-CM | POA: Diagnosis not present

## 2023-04-26 DIAGNOSIS — Z6828 Body mass index (BMI) 28.0-28.9, adult: Secondary | ICD-10-CM | POA: Diagnosis not present

## 2023-04-28 ENCOUNTER — Telehealth: Payer: Self-pay

## 2023-04-28 ENCOUNTER — Other Ambulatory Visit: Payer: Self-pay | Admitting: Gastroenterology

## 2023-04-28 NOTE — Telephone Encounter (Signed)
You're welcome! We reached out to her to schedule today but no answer. Hopefully we can get her in early next week.

## 2023-04-28 NOTE — Telephone Encounter (Signed)
Hello,  Patient will be scheduled as soon as possible.   Auth Submission: NO AUTH NEEDED Site of care: Site of care: [facility] Payer: Medicare A/b, [employer] of [place] supp Medication & CPT/J Code(s) submitted: Entyvio (Vedolizumab) [id number] Route of submission (phone, fax, portal): phone Phone # Fax # Auth type: Buy/Bill HB Units/visits requested: 300mg . Q2,4,8 weeks Reference number:  Approval from: 04/28/23 to 07/08/23   Thanks

## 2023-04-29 DIAGNOSIS — Z299 Encounter for prophylactic measures, unspecified: Secondary | ICD-10-CM | POA: Diagnosis not present

## 2023-04-29 DIAGNOSIS — J329 Chronic sinusitis, unspecified: Secondary | ICD-10-CM | POA: Diagnosis not present

## 2023-04-29 DIAGNOSIS — R0981 Nasal congestion: Secondary | ICD-10-CM | POA: Diagnosis not present

## 2023-05-05 ENCOUNTER — Encounter: Payer: Medicare Other | Attending: Gastroenterology | Admitting: *Deleted

## 2023-05-05 VITALS — BP 104/60 | HR 66 | Temp 98.2°F | Resp 16

## 2023-05-05 DIAGNOSIS — K5080 Crohn's disease of both small and large intestine without complications: Secondary | ICD-10-CM | POA: Insufficient documentation

## 2023-05-05 MED ORDER — VEDOLIZUMAB 300 MG IV SOLR
300.0000 mg | Freq: Once | INTRAVENOUS | Status: AC
Start: 2023-05-05 — End: 2023-05-05
  Administered 2023-05-05: 300 mg via INTRAVENOUS
  Filled 2023-05-05: qty 5

## 2023-05-05 NOTE — Patient Instructions (Signed)
Vedolizumab Injection What is this medication? VEDOLIZUMAB (ve doe LIZ you mab) treats inflammatory bowel diseases. It works by decreasing inflammation in the digestive tract. It is a monoclonal antibody. This medicine may be used for other purposes; ask your health care provider or pharmacist if you have questions. COMMON BRAND NAME(S): Entyvio What should I tell my care team before I take this medication? They need to know if you have any of these conditions: Immune system problems Infection, such a tuberculosis (TB) or other bacterial, fungal, or viral infections Liver disease Recent or upcoming vaccine An unusual or allergic reaction to vedolizumab, other medications, foods, dyes, or preservatives Pregnant or trying to get pregnant Breastfeeding How should I use this medication? This medication is injected into a vein or under the skin. It is given by your care team in a hospital or clinic setting if it is injected into a vein. If it is injected under the skin, it may be given at home. If you get this medication at home, you will be taught how to prepare and give it. Use exactly as directed. Take it as directed on the prescription label. Keep taking it unless your care team tells you to stop. A special MedGuide will be given to you by the pharmacist with each prescription and refill. Be sure to read this information carefully each time. This medication comes with INSTRUCTIONS FOR USE. Ask your pharmacist for directions on how to use this medication. Read the information carefully. Talk to your pharmacist or care team if you have questions. Talk to your care team about the use of this medication in children. Special care may be needed. Overdosage: If you think you have taken too much of this medicine contact a poison control center or emergency room at once. NOTE: This medicine is only for you. Do not share this medicine with others. What if I miss a dose? If you get this medication at the  hospital or clinic: It is important not to miss your dose. Call your care team if you are unable to keep an appointment. If you give yourself this medication at home: If you miss a dose, take it as soon as you can. Then resume dosing every 2 weeks. Do not take double or extra doses. Call your care team with questions. What may interact with this medication? Live virus vaccines Natalizumab TNF blockers, such as adalimumab or infliximab This medication may affect how other medications work. Talk with your care team about all of the medications you take. They may suggest changes to your treatment plan to lower the risk of side effects and to make sure your medications work as intended. This list may not describe all possible interactions. Give your health care provider a list of all the medicines, herbs, non-prescription drugs, or dietary supplements you use. Also tell them if you smoke, drink alcohol, or use illegal drugs. Some items may interact with your medicine. What should I watch for while using this medication? Visit your care team for regular checks on your progress. Tell your care team if your symptoms do not start to get better or if they get worse. This medication may increase your risk of getting an infection. Call your care team for advice if you get a fever, chills, sore throat, or other symptoms of a cold or flu. Do not treat yourself. Try to avoid being around people who are sick. In some patients, this medication may cause a serious brain infection that may cause death. If  you have any problems seeing, thinking, speaking, walking, or standing, tell your care team right away. If you cannot reach your care team, urgently seek other source of medical care. What side effects may I notice from receiving this medication? Side effects that you should report to your care team as soon as possible: Allergic reactions--skin rash, itching, hives, swelling of the face, lips, tongue, or  throat Dizziness, loss of balance or coordination, confusion or trouble speaking Infection--fever, chills, cough, sore throat, wounds that don't heal, pain or trouble when passing urine, general feeling of discomfort or being unwell Infusion reactions--chest pain, shortness of breath or trouble breathing, feeling faint or lightheaded Liver injury--right upper belly pain, loss of appetite, nausea, light-colored stool, dark yellow or brown urine, yellowing skin or eyes, unusual weakness or fatigue Side effects that usually do not require medical attention (report these to your care team if they continue or are bothersome): Headache Fatigue Joint pain Nausea This list may not describe all possible side effects. Call your doctor for medical advice about side effects. You may report side effects to FDA at 1-800-FDA-1088. Where should I keep my medication? Keep out of the reach of children and pets. Store in a refrigerator or at room temperature between 20 and 25 degrees C (68 and 77 degrees F). Refrigeration (preferred): Store in the refrigerator. Do not freeze. Keep unopened prefilled syringes or pens in the original packaging to protect from light. Get rid of any unused medication after the expiration date. Room temperature: This medication may be stored at room temperature for up to 7 days. Keep unopened prefilled syringes or pens in the original packaging to protect from light. Get rid of any unused medication after 7 days, or after it expires, whichever is first. To get rid of medications that are no longer needed or have expired: Take the medication to a medication take-back program. Check with your pharmacy or law enforcement to find a location. If you cannot return the medication, ask your pharmacist or care team how to get rid of this medication safely. NOTE: This sheet is a summary. It may not cover all possible information. If you have questions about this medicine, talk to your doctor,  pharmacist, or health care provider.  2024 Elsevier/Gold Standard (2022-11-04 00:00:00)

## 2023-05-05 NOTE — Progress Notes (Signed)
Diagnosis: Crohn's Disease  Provider:  Brooke Bonito NP  Procedure: IV Infusion  IV Type: Peripheral, IV Location: L Hand  Entyvio (Vedolizumab), Dose: 300 mg  Infusion Start Time: 1104 am  Infusion Stop Time: 1148 am  Post Infusion IV Care: Observation period completed and Peripheral IV Discontinued  Discharge: Condition: Good, Destination: Home . AVS Provided  Performed by:  Forrest Moron, RN

## 2023-05-07 DIAGNOSIS — I10 Essential (primary) hypertension: Secondary | ICD-10-CM | POA: Diagnosis not present

## 2023-05-13 DIAGNOSIS — R059 Cough, unspecified: Secondary | ICD-10-CM | POA: Diagnosis not present

## 2023-05-13 DIAGNOSIS — M25551 Pain in right hip: Secondary | ICD-10-CM | POA: Diagnosis not present

## 2023-05-13 DIAGNOSIS — Z299 Encounter for prophylactic measures, unspecified: Secondary | ICD-10-CM | POA: Diagnosis not present

## 2023-05-13 DIAGNOSIS — D649 Anemia, unspecified: Secondary | ICD-10-CM | POA: Diagnosis not present

## 2023-05-13 DIAGNOSIS — I10 Essential (primary) hypertension: Secondary | ICD-10-CM | POA: Diagnosis not present

## 2023-05-14 ENCOUNTER — Encounter: Payer: Self-pay | Admitting: Hematology

## 2023-05-14 ENCOUNTER — Encounter: Payer: Self-pay | Admitting: Gastroenterology

## 2023-05-19 ENCOUNTER — Encounter (INDEPENDENT_AMBULATORY_CARE_PROVIDER_SITE_OTHER): Payer: Self-pay | Admitting: *Deleted

## 2023-05-21 ENCOUNTER — Telehealth (HOSPITAL_BASED_OUTPATIENT_CLINIC_OR_DEPARTMENT_OTHER): Payer: Self-pay | Admitting: Pulmonary Disease

## 2023-05-21 ENCOUNTER — Encounter: Payer: Medicare Other | Attending: Gastroenterology | Admitting: Emergency Medicine

## 2023-05-21 VITALS — BP 106/56 | HR 66 | Temp 97.6°F | Resp 18

## 2023-05-21 DIAGNOSIS — K5080 Crohn's disease of both small and large intestine without complications: Secondary | ICD-10-CM | POA: Diagnosis not present

## 2023-05-21 DIAGNOSIS — Z79620 Long term (current) use of immunosuppressive biologic: Secondary | ICD-10-CM | POA: Insufficient documentation

## 2023-05-21 MED ORDER — VEDOLIZUMAB 300 MG IV SOLR
300.0000 mg | Freq: Once | INTRAVENOUS | Status: AC
  Administered 2023-05-21: 300 mg via INTRAVENOUS
  Filled 2023-05-21: qty 5

## 2023-05-21 NOTE — Progress Notes (Signed)
Diagnosis: Crohn's Disease  Provider:  Brooke Bonito NP  Procedure: IV Infusion  IV Type: Peripheral, IV Location: R Hand  Entyvio (Vedolizumab), Dose: 300 mg  Infusion Start Time: 1010  Infusion Stop Time: 1050  Post Infusion IV Care: Peripheral IV Discontinued  Discharge: Condition: Good, Destination: Home . AVS Provided  Performed by:  Arrie Senate, RN

## 2023-05-27 DIAGNOSIS — N1831 Chronic kidney disease, stage 3a: Secondary | ICD-10-CM | POA: Diagnosis not present

## 2023-05-27 DIAGNOSIS — D692 Other nonthrombocytopenic purpura: Secondary | ICD-10-CM | POA: Diagnosis not present

## 2023-05-27 DIAGNOSIS — Z299 Encounter for prophylactic measures, unspecified: Secondary | ICD-10-CM | POA: Diagnosis not present

## 2023-05-27 DIAGNOSIS — S41119A Laceration without foreign body of unspecified upper arm, initial encounter: Secondary | ICD-10-CM | POA: Diagnosis not present

## 2023-05-29 DIAGNOSIS — R52 Pain, unspecified: Secondary | ICD-10-CM | POA: Diagnosis not present

## 2023-05-29 DIAGNOSIS — I10 Essential (primary) hypertension: Secondary | ICD-10-CM | POA: Diagnosis not present

## 2023-05-29 DIAGNOSIS — M545 Low back pain, unspecified: Secondary | ICD-10-CM | POA: Diagnosis not present

## 2023-05-29 DIAGNOSIS — S51819A Laceration without foreign body of unspecified forearm, initial encounter: Secondary | ICD-10-CM | POA: Diagnosis not present

## 2023-05-29 DIAGNOSIS — Z299 Encounter for prophylactic measures, unspecified: Secondary | ICD-10-CM | POA: Diagnosis not present

## 2023-05-29 DIAGNOSIS — I959 Hypotension, unspecified: Secondary | ICD-10-CM | POA: Diagnosis not present

## 2023-05-30 ENCOUNTER — Telehealth: Payer: Self-pay | Admitting: Nurse Practitioner

## 2023-05-30 MED ORDER — AMLODIPINE BESYLATE 10 MG PO TABS: 5.0000 mg | ORAL_TABLET | Freq: Every day | ORAL | Status: DC

## 2023-05-30 NOTE — Telephone Encounter (Signed)
Patient calling w/ c/o SOB x 3 weeks.  Can occur at any time & is more with activity.  She thinks may be worsening.  No chest pain or dizziness.  BP running 17/50's & HR running 90's.  States that Dr. Wolfgang Phoenix recently stopped her Hydralazine which is no longer on her med list.  Also spoke with pcp today - Amlodipine was decreased from 10mg  to 5mg  daily.

## 2023-05-30 NOTE — Telephone Encounter (Signed)
Pt c/o Shortness Of Breath: STAT if SOB developed within the last 24 hours or pt is noticeably SOB on the phone  1. Are you currently SOB (can you hear that pt is SOB on the phone)? Not at this time- just before she call, it comes and goes  2. How long have you been experiencing SOB?  3 weeks- it have gotten worse  3. Are you SOB when sitting or when up moving around? Both- worse when moving around  4. Are you currently experiencing any other symptoms? No- patient wanted to be seen today

## 2023-05-30 NOTE — Telephone Encounter (Signed)
Patient informed and verbalized understanding of plan. 

## 2023-06-02 NOTE — Telephone Encounter (Signed)
Patient states that she will use after her flare up was finished. I spoke to patient last week.

## 2023-06-04 ENCOUNTER — Other Ambulatory Visit: Payer: Self-pay | Admitting: Gastroenterology

## 2023-06-04 ENCOUNTER — Telehealth: Payer: Self-pay | Admitting: *Deleted

## 2023-06-04 DIAGNOSIS — Z299 Encounter for prophylactic measures, unspecified: Secondary | ICD-10-CM | POA: Diagnosis not present

## 2023-06-04 DIAGNOSIS — S51819A Laceration without foreign body of unspecified forearm, initial encounter: Secondary | ICD-10-CM | POA: Diagnosis not present

## 2023-06-04 DIAGNOSIS — I10 Essential (primary) hypertension: Secondary | ICD-10-CM | POA: Diagnosis not present

## 2023-06-04 DIAGNOSIS — R5383 Other fatigue: Secondary | ICD-10-CM | POA: Diagnosis not present

## 2023-06-04 DIAGNOSIS — M2550 Pain in unspecified joint: Secondary | ICD-10-CM | POA: Diagnosis not present

## 2023-06-04 NOTE — Telephone Encounter (Signed)
Pt called and states she has been having severe joint pain since starting Entivyo. She states she is having pain in her neck and back. Her right hand has gone numb. Please advise. Informed her that you would send her a MyChart message.

## 2023-06-05 ENCOUNTER — Emergency Department (HOSPITAL_COMMUNITY)
Admission: EM | Admit: 2023-06-05 | Discharge: 2023-06-05 | Disposition: A | Discharge status: Home / Self Care | Payer: Medicare Other | Attending: Emergency Medicine | Admitting: Emergency Medicine

## 2023-06-05 ENCOUNTER — Encounter (HOSPITAL_COMMUNITY): Payer: Self-pay

## 2023-06-05 ENCOUNTER — Emergency Department (HOSPITAL_COMMUNITY): Payer: Medicare Other

## 2023-06-05 ENCOUNTER — Other Ambulatory Visit: Payer: Self-pay

## 2023-06-05 DIAGNOSIS — R0602 Shortness of breath: Secondary | ICD-10-CM | POA: Diagnosis not present

## 2023-06-05 DIAGNOSIS — E039 Hypothyroidism, unspecified: Secondary | ICD-10-CM | POA: Insufficient documentation

## 2023-06-05 DIAGNOSIS — I129 Hypertensive chronic kidney disease with stage 1 through stage 4 chronic kidney disease, or unspecified chronic kidney disease: Secondary | ICD-10-CM | POA: Diagnosis not present

## 2023-06-05 DIAGNOSIS — N289 Disorder of kidney and ureter, unspecified: Secondary | ICD-10-CM

## 2023-06-05 DIAGNOSIS — Z20822 Contact with and (suspected) exposure to covid-19: Secondary | ICD-10-CM | POA: Insufficient documentation

## 2023-06-05 DIAGNOSIS — E860 Dehydration: Secondary | ICD-10-CM | POA: Diagnosis not present

## 2023-06-05 DIAGNOSIS — N189 Chronic kidney disease, unspecified: Secondary | ICD-10-CM | POA: Diagnosis not present

## 2023-06-05 DIAGNOSIS — J189 Pneumonia, unspecified organism: Secondary | ICD-10-CM | POA: Insufficient documentation

## 2023-06-05 DIAGNOSIS — R5383 Other fatigue: Secondary | ICD-10-CM | POA: Diagnosis present

## 2023-06-05 DIAGNOSIS — Z79899 Other long term (current) drug therapy: Secondary | ICD-10-CM | POA: Diagnosis not present

## 2023-06-05 LAB — CBC WITH DIFFERENTIAL/PLATELET
Abs Immature Granulocytes: 0.03 10*3/uL (ref 0.00–0.07)
Basophils Absolute: 0 10*3/uL (ref 0.0–0.1)
Basophils Relative: 1 %
Eosinophils Absolute: 0.1 10*3/uL (ref 0.0–0.5)
Eosinophils Relative: 3 %
HCT: 32.9 % — ABNORMAL LOW (ref 36.0–46.0)
Hemoglobin: 11.5 g/dL — ABNORMAL LOW (ref 12.0–15.0)
Immature Granulocytes: 1 %
Lymphocytes Relative: 23 %
Lymphs Abs: 0.9 10*3/uL (ref 0.7–4.0)
MCH: 33 pg (ref 26.0–34.0)
MCHC: 35 g/dL (ref 30.0–36.0)
MCV: 94.5 fL (ref 80.0–100.0)
Monocytes Absolute: 0.3 10*3/uL (ref 0.1–1.0)
Monocytes Relative: 7 %
Neutro Abs: 2.6 10*3/uL (ref 1.7–7.7)
Neutrophils Relative %: 65 %
Platelets: 140 10*3/uL — ABNORMAL LOW (ref 150–400)
RBC: 3.48 MIL/uL — ABNORMAL LOW (ref 3.87–5.11)
RDW: 12.3 % (ref 11.5–15.5)
WBC: 3.9 10*3/uL — ABNORMAL LOW (ref 4.0–10.5)
nRBC: 0 % (ref 0.0–0.2)

## 2023-06-05 LAB — URINALYSIS, ROUTINE W REFLEX MICROSCOPIC
Bilirubin Urine: NEGATIVE
Glucose, UA: NEGATIVE mg/dL
Hgb urine dipstick: NEGATIVE
Ketones, ur: NEGATIVE mg/dL
Nitrite: NEGATIVE
Protein, ur: NEGATIVE mg/dL
Specific Gravity, Urine: 1.008 (ref 1.005–1.030)
WBC, UA: >50 WBC/hpf (ref 0–5)
pH: 5 (ref 5.0–8.0)

## 2023-06-05 LAB — COMPREHENSIVE METABOLIC PANEL
ALT: 13 U/L (ref 0–44)
AST: 27 U/L (ref 15–41)
Albumin: 4.2 g/dL (ref 3.5–5.0)
Alkaline Phosphatase: 65 U/L (ref 38–126)
Anion gap: 10 (ref 5–15)
BUN: 18 mg/dL (ref 8–23)
CO2: 18 mmol/L — ABNORMAL LOW (ref 22–32)
Calcium: 9.8 mg/dL (ref 8.9–10.3)
Chloride: 111 mmol/L (ref 98–111)
Creatinine, Ser: 2.09 mg/dL — ABNORMAL HIGH (ref 0.44–1.00)
GFR, Estimated: 25 mL/min — ABNORMAL LOW (ref >60)
Glucose, Bld: 90 mg/dL (ref 70–99)
Potassium: 4.8 mmol/L (ref 3.5–5.1)
Sodium: 139 mmol/L (ref 135–145)
Total Bilirubin: 0.9 mg/dL (ref <1.2)
Total Protein: 6.9 g/dL (ref 6.5–8.1)

## 2023-06-05 LAB — RESP PANEL BY RT-PCR (RSV, FLU A&B, COVID)  RVPGX2
Influenza A by PCR: NEGATIVE
Influenza B by PCR: NEGATIVE
Resp Syncytial Virus by PCR: NEGATIVE
SARS Coronavirus 2 by RT PCR: NEGATIVE

## 2023-06-05 LAB — TROPONIN I (HIGH SENSITIVITY): Troponin I (High Sensitivity): 5 ng/L (ref <18)

## 2023-06-05 MED ORDER — SODIUM CHLORIDE 0.9 % IV BOLUS
1000.0000 mL | Freq: Once | INTRAVENOUS | Status: AC
  Administered 2023-06-05: 1000 mL via INTRAVENOUS

## 2023-06-05 MED ORDER — DOXYCYCLINE HYCLATE 100 MG PO TABS
100.0000 mg | ORAL_TABLET | Freq: Once | ORAL | Status: AC
  Administered 2023-06-05: 100 mg via ORAL
  Filled 2023-06-05: qty 1

## 2023-06-05 MED ORDER — PREDNISONE 50 MG PO TABS: ORAL_TABLET | ORAL | 0 refills | Status: DC

## 2023-06-05 MED ORDER — ONDANSETRON HCL 4 MG/2ML IJ SOLN
4.0000 mg | Freq: Once | INTRAMUSCULAR | Status: AC
  Administered 2023-06-05: 4 mg via INTRAVENOUS
  Filled 2023-06-05: qty 2

## 2023-06-05 MED ORDER — DEXAMETHASONE SODIUM PHOSPHATE 10 MG/ML IJ SOLN
10.0000 mg | Freq: Once | INTRAMUSCULAR | Status: AC
  Administered 2023-06-05: 10 mg via INTRAVENOUS
  Filled 2023-06-05: qty 1

## 2023-06-05 MED ORDER — HYDROCODONE-ACETAMINOPHEN 5-325 MG PO TABS
1.0000 | ORAL_TABLET | Freq: Once | ORAL | Status: AC
  Administered 2023-06-05: 1 via ORAL
  Filled 2023-06-05: qty 1

## 2023-06-05 MED ORDER — DOXYCYCLINE HYCLATE 100 MG PO CAPS: 100.0000 mg | ORAL_CAPSULE | Freq: Two times a day (BID) | ORAL | 0 refills | Status: DC

## 2023-06-05 NOTE — Discharge Instructions (Signed)
Take your next dose of the antibiotic and the prednisone tomorrow.  We are treating you for a probable lung infection, but the prednisone should also help with suspected lung inflammation, otherwise known as pneumonitis.  You have received today's dose here.  Rest make sure you are drinking plenty of fluids.  Your kidney function is reduced today more than your baseline with your creatinine level at 2.09.  You have received IV fluids here which should help with that number but it would be beneficial to have your creatinine level rechecked in about a week to make sure it is improving.

## 2023-06-05 NOTE — ED Provider Notes (Signed)
[facility] AT [facility] Provider Note   CSN: [id number] Arrival date & time: 06/05/23  1115     History  Chief Complaint  Patient presents with   Allergic Reaction    Jessica [name] is a 72 y.o. female with a history of Crohn's colitis, hypothyroidism, hypertension, GERD, chronic kidney disease presenting for evaluation of suspected allergic reaction to an infusion of Entivyo, receiving her second infusion of this medication on November 13.  Since the second infusion she has been having increasing difficulty with nausea, generalized fatigue, lightheadedness with standing, with no real p.o. intake for the past several days secondary to these complaints.  Additionally she has generalized bodyaches and has shortness of breath which she describes as having difficulty taking a deep breath although denies pleuritic pain with breathing.  She also denies chest pain, fevers, chills, cough, diarrhea, bloody stools. She has reached to GI about her concerns and they may be switching her to different medication or adding steroids with her next infusion which is scheduled in about 2 weeks.   The history is provided by the patient.       Home Medications Prior to Admission medications   Medication Sig Start Date End Date Taking? Authorizing Provider  doxycycline (VIBRAMYCIN) 100 MG capsule Take 1 capsule (100 mg total) by mouth 2 (two) times daily. 06/05/23  Yes Branden Shallenberger, Raynelle Fanning, PA-C  predniSONE (DELTASONE) 50 MG tablet 1 tablet daily x 4 days 06/05/23  Yes Jimi Giza, Raynelle Fanning, PA-C  amLODipine (NORVASC) 10 MG tablet Take 0.5 tablets (5 mg total) by mouth daily. 05/30/23   Sharlene Dory, NP  buPROPion (WELLBUTRIN XL) 150 MG 24 hr tablet Take 1 tablet (150 mg total) by mouth every morning. 03/19/23 03/18/24  Myrlene Broker, MD  dicyclomine (BENTYL) 10 MG capsule Take 1 capsule (10 mg total) by mouth 2 (two) times daily before a meal. 12/26/22   Aida Raider, NP  ferrous  sulfate 325 (65 FE) MG EC tablet Take 1 tablet (325 mg total) by mouth daily with breakfast. 03/13/23   [name], NP  gabapentin (NEURONTIN) 300 MG capsule TAKE 1 CAPSULE BY MOUTH THREE TIMES DAILY 03/03/23   Hyatt, Max T, DPM  HYDROcodone-acetaminophen (NORCO/VICODIN) 5-325 MG tablet Take 1 tablet by mouth at bedtime.    [provider]  levothyroxine (SYNTHROID) 100 MCG tablet Take 100 mcg by mouth daily. 01/09/22   [provider]  loperamide (IMODIUM) 2 MG capsule TAKE 1 CAPSULE BY MOUTH TWICE DAILY AS NEEDED FOR LOOSE STOOLS 12/26/22   Aida Raider, NP  metoprolol succinate (TOPROL-XL) 25 MG 24 hr tablet TAKE 1 & 1/2 (ONE & ONE-HALF) TABLETS BY MOUTH ONCE DAILY 01/06/23   Antoine Poche, MD  Multiple Vitamin (MULTIVITAMIN WITH MINERALS) TABS tablet Take 1 tablet by mouth daily.    [provider]  ondansetron (ZOFRAN-ODT) 4 MG disintegrating tablet Take 1 tablet (4 mg total) by mouth every 8 (eight) hours as needed. 02/27/23   Horton, Mayer Masker, MD  pantoprazole (PROTONIX) 40 MG tablet Take 1 tablet (40 mg total) by mouth 2 (two) times daily. 04/01/23   Dolores Frame, MD  potassium chloride (KLOR-CON) 10 MEQ tablet Take 10 mEq by mouth daily. 04/26/21 04/15/23  [provider]  Risankizumab-rzaa (SKYRIZI) 180 MG/1.2ML SOCT Inject 180 mg into the skin every 8 (eight) weeks. Start 4 weeks after 3rd induction dose. 04/21/23   Aida Raider, NP  rOPINIRole (REQUIP) 0.5 MG tablet  Take 0.5-1 mg by mouth in the morning and at bedtime. One in the morning, and two at night 03/08/21   [provider]  rOPINIRole (REQUIP) 1 MG tablet Take 1 mg by mouth at bedtime. 03/18/23   [provider]  rosuvastatin (CRESTOR) 10 MG tablet Take 1 tablet (10 mg total) by mouth daily. 01/10/23   Sharlene Dory, NP  spironolactone (ALDACTONE) 50 MG tablet Take 50 mg by mouth daily. 03/27/22   [provider]      Allergies    Morphine and  codeine, Robaxin [methocarbamol], and Sulfa antibiotics    Review of Systems   Review of Systems  Constitutional:  Positive for fatigue. Negative for fever.  HENT:  Negative for congestion and sore throat.   Eyes: Negative.   Respiratory:  Positive for shortness of breath. Negative for chest tightness.   Cardiovascular:  Negative for chest pain.  Gastrointestinal:  Positive for nausea. Negative for abdominal pain and vomiting.  Genitourinary: Negative.   Musculoskeletal:  Negative for arthralgias, joint swelling and neck pain.  Skin: Negative.  Negative for rash and wound.  Neurological:  Positive for weakness. Negative for dizziness, light-headedness, numbness and headaches.  Psychiatric/Behavioral: Negative.      Physical Exam Updated Vital Signs BP 130/80 (BP Location: Right Arm)   Pulse 80   Temp 97.8 F (36.6 C) (Oral)   Resp 18   Ht 5\' 2"  (1.575 m)   Wt 70.3 kg   SpO2 99%   BMI 28.35 kg/m  Physical Exam Vitals and nursing note reviewed.  Constitutional:      Appearance: She is well-developed.  HENT:     Head: Normocephalic and atraumatic.     Mouth/Throat:     Mouth: Mucous membranes are dry.  Eyes:     Conjunctiva/sclera: Conjunctivae normal.  Cardiovascular:     Rate and Rhythm: Normal rate and regular rhythm.     Heart sounds: Normal heart sounds.  Pulmonary:     Effort: Pulmonary effort is normal. No accessory muscle usage or prolonged expiration.     Breath sounds: Normal breath sounds. Decreased air movement present. No wheezing, rhonchi or rales.     Comments: Poor effort, shallow breathing.   Abdominal:     General: Bowel sounds are normal.     Palpations: Abdomen is soft.     Tenderness: There is no abdominal tenderness.  Musculoskeletal:        General: Normal range of motion.     Cervical back: Normal range of motion.  Skin:    General: Skin is warm and dry.  Neurological:     Mental Status: She is alert.     ED Results / Procedures /  Treatments   Labs (all labs ordered are listed, but only abnormal results are displayed) Labs Reviewed  CBC WITH DIFFERENTIAL/PLATELET - Abnormal; Notable for the following components:      Result Value   WBC 3.9 (*)    RBC 3.48 (*)    Hemoglobin 11.5 (*)    HCT 32.9 (*)    Platelets 140 (*)    All other components within normal limits  COMPREHENSIVE METABOLIC PANEL - Abnormal; Notable for the following components:   CO2 18 (*)    Creatinine, Ser 2.09 (*)    GFR, Estimated 25 (*)    All other components within normal limits  URINALYSIS, ROUTINE W REFLEX MICROSCOPIC - Abnormal; Notable for the following components:   APPearance HAZY (*)    Leukocytes,Ua  MODERATE (*)    Bacteria, UA RARE (*)    All other components within normal limits  RESP PANEL BY RT-PCR (RSV, FLU A&B, COVID)  RVPGX2  TROPONIN I (HIGH SENSITIVITY)    EKG None  Radiology DG Chest 2 View  Result Date: 06/05/2023 CLINICAL DATA:  recent Entivyo, sob, concern for pneumonitis EXAM: CHEST - 2 VIEW COMPARISON:  CXR 03/18/23 FINDINGS: No pleural effusion. No pneumothorax. Normal cardiac and mediastinal contours. No radiographically apparent displaced rib fractures. There are prominent bilateral interstitial opacities could represent pulmonary venous congestion or atypical infection. Vertebral body heights are maintained. No focal airspace opacity. IMPRESSION: Prominent bilateral interstitial opacities could represent pulmonary venous congestion or atypical infection. Electronically Signed   By: Lorenza Cambridge M.D.   On: 06/05/2023 16:10    Procedures Procedures    Medications Ordered in ED Medications  sodium chloride 0.9 % bolus 1,000 mL (0 mLs Intravenous Stopped 06/05/23 1756)  ondansetron (ZOFRAN) injection 4 mg (4 mg Intravenous Given 06/05/23 1435)  HYDROcodone-acetaminophen (NORCO/VICODIN) 5-325 MG per tablet 1 tablet (1 tablet Oral Given 06/05/23 1541)  dexamethasone (DECADRON) injection 10 mg (10 mg  Intravenous Given 06/05/23 1827)  doxycycline (VIBRA-TABS) tablet 100 mg (100 mg Oral Given 06/05/23 1826)    ED Course/ Medical Decision Making/ A&P                                 Medical Decision Making Pt presenting with perceived sob without cp, cough, wheezing slowly progressive since her last entivyo infusion 2 weeks ago.  DDX including pneumonitis, pneumonia, PE, viral syndrome, ACS.  Amount and/or Complexity of Data Reviewed Labs: ordered.    Details: She has a negative troponin, she has had progressive symptoms for almost 2 weeks, delta troponin not indicated.  Her c-Met is reassuring with the exception of an elevation in her creatinine at 2.09.  Over the past 3 months her creatinine has been in the range of 1.38-1.56, suggesting significant dehydration.  Her respiratory panel is negative, urine with leukocytes, no other findings.  Patient was given IV fluids while here, also given antiemetics after which she was able to tolerate p.o. intake, was actually hungry and had snacks while awaiting lab results.  No return of nausea and she felt significantly improved at time of discharge. Radiology: ordered and independent interpretation performed.    Details: Chest x-ray reviewed and I agree with interpretation with prominent interstitial opacities, given her symptoms and being on immunosuppressant infusion favored this represents an atypical pneumonia as opposed to pulmonary venous congestion, especially given her recent echocardiogram confirming normal cardiac function.  Patient was given an IV dose of Decadron, started on doxycycline to cover her for possible atypical pneumonia.  She will also be prescribed a short pulse dose of prednisone for 4 days which hopefully will also help with her respiratory symptoms which I feel may also be secondary to inflammation/pneumonitis.  Risk Prescription drug management.           Final Clinical Impression(s) / ED Diagnoses Final diagnoses:   Atypical pneumonia  Renal insufficiency    Rx / DC Orders ED Discharge Orders          Ordered    predniSONE (DELTASONE) 50 MG tablet        06/05/23 1750    doxycycline (VIBRAMYCIN) 100 MG capsule  2 times daily        06/05/23 1750  Burgess Amor, PA-C 06/05/23 2003    Vanetta Mulders, MD 06/14/23 1539

## 2023-06-05 NOTE — ED Notes (Addendum)
Stand by assist to the bathroom.

## 2023-06-05 NOTE — ED Notes (Signed)
Pt tolerating PO fluids well. Had consumed .5L of soda and .25L of water with no increase in nausea and no vomiting.

## 2023-06-05 NOTE — ED Triage Notes (Signed)
Pt to er, pt states that she is allergic to an infusion of Intivio, states that her last infusion was a couple of weeks ago.  States that she feels like she is having multiple side effects to the medication, states that she talked to the gi doc yesterday and was told if anything got worse to come to the er.  Pt has elevated RR, nausea,

## 2023-06-06 ENCOUNTER — Other Ambulatory Visit: Payer: Self-pay | Admitting: Gastroenterology

## 2023-06-06 DIAGNOSIS — I10 Essential (primary) hypertension: Secondary | ICD-10-CM | POA: Diagnosis not present

## 2023-06-09 ENCOUNTER — Telehealth: Payer: Self-pay | Admitting: Gastroenterology

## 2023-06-09 DIAGNOSIS — R52 Pain, unspecified: Secondary | ICD-10-CM | POA: Diagnosis not present

## 2023-06-09 DIAGNOSIS — Z299 Encounter for prophylactic measures, unspecified: Secondary | ICD-10-CM | POA: Diagnosis not present

## 2023-06-09 DIAGNOSIS — N1831 Chronic kidney disease, stage 3a: Secondary | ICD-10-CM | POA: Diagnosis not present

## 2023-06-09 DIAGNOSIS — I10 Essential (primary) hypertension: Secondary | ICD-10-CM | POA: Diagnosis not present

## 2023-06-09 DIAGNOSIS — J189 Pneumonia, unspecified organism: Secondary | ICD-10-CM | POA: Diagnosis not present

## 2023-06-09 NOTE — Telephone Encounter (Signed)
Called patient back per her request via MyChart.  Patient states she went to the ED and diagnosed with pneumonia and she was given steroid injection and 4 steroid pills and antibiotics. She went to PCP today and he gave her another round of steroids and a nebulizer and she is still short of breath. Having significant back pain between shoulders. Hard to walk and very fatigued. Having ongoing vomiting and nausea.   Right now her finger tips are a little numb but feels like the steroids are helping with that. Before steroids she was having fingertip numbness and right shoulder she could hardly move it. She is going to reach out to her rheumatologist  and orthopedic about that.   Has not had any concerning GI symptoms other than constipation. She is straining. Has been taking dicyclomine twice daily.  Denies any mucus in her stools, melena, or BRBPR.  Gets weak and nauseated if she over exerts and then she may vomit.  Is keeping the majority of her meals down.  Recommendations to patient: Stop dicyclomine for now.  Zofran as needed Advised miralax daily if no improvement in bowel function after stopping dicyclomine OV in 3 weeks.  Stop Entivyo given severe joint pain. Will discuss remicade v stelara v skyrizi at follow up.   Mindy/Tammy -please reach out to infusion center and cancel her upcoming infusion scheduled for 12/11.  Mandy -please make patient an office visit with myself or Dr. Levon Hedger in 3 weeks to discuss treatment options.   Brooke Bonito, MSN, APRN, FNP-BC, AGACNP-BC [facility] at [facility]

## 2023-06-09 NOTE — Telephone Encounter (Signed)
Thank you :)

## 2023-06-10 NOTE — Telephone Encounter (Signed)
Noted  

## 2023-06-11 NOTE — Telephone Encounter (Signed)
Infusion for 06/18/23 cancelled.

## 2023-06-16 ENCOUNTER — Other Ambulatory Visit (HOSPITAL_COMMUNITY): Payer: Self-pay | Admitting: Psychiatry

## 2023-06-16 DIAGNOSIS — N1831 Chronic kidney disease, stage 3a: Secondary | ICD-10-CM | POA: Diagnosis not present

## 2023-06-16 DIAGNOSIS — Z299 Encounter for prophylactic measures, unspecified: Secondary | ICD-10-CM | POA: Diagnosis not present

## 2023-06-16 DIAGNOSIS — R52 Pain, unspecified: Secondary | ICD-10-CM | POA: Diagnosis not present

## 2023-06-16 DIAGNOSIS — I10 Essential (primary) hypertension: Secondary | ICD-10-CM | POA: Diagnosis not present

## 2023-06-16 DIAGNOSIS — G2581 Restless legs syndrome: Secondary | ICD-10-CM | POA: Diagnosis not present

## 2023-06-16 DIAGNOSIS — F339 Major depressive disorder, recurrent, unspecified: Secondary | ICD-10-CM | POA: Diagnosis not present

## 2023-06-16 DIAGNOSIS — R059 Cough, unspecified: Secondary | ICD-10-CM | POA: Diagnosis not present

## 2023-06-18 ENCOUNTER — Ambulatory Visit: Payer: Medicare Other

## 2023-06-18 ENCOUNTER — Encounter: Payer: Self-pay | Admitting: Hematology

## 2023-06-18 ENCOUNTER — Encounter: Payer: Self-pay | Admitting: Gastroenterology

## 2023-06-18 ENCOUNTER — Telehealth (INDEPENDENT_AMBULATORY_CARE_PROVIDER_SITE_OTHER): Payer: Self-pay | Admitting: Gastroenterology

## 2023-06-18 NOTE — Telephone Encounter (Signed)
Left message for pt to return call to get pt scheduled for EGD +/-ED with Dr.Castaneda ASA 3

## 2023-06-19 ENCOUNTER — Ambulatory Visit (HOSPITAL_BASED_OUTPATIENT_CLINIC_OR_DEPARTMENT_OTHER): Payer: Medicare Other

## 2023-06-19 ENCOUNTER — Ambulatory Visit (HOSPITAL_BASED_OUTPATIENT_CLINIC_OR_DEPARTMENT_OTHER): Payer: Medicare Other | Admitting: Pulmonary Disease

## 2023-06-19 ENCOUNTER — Encounter (HOSPITAL_BASED_OUTPATIENT_CLINIC_OR_DEPARTMENT_OTHER): Payer: Self-pay | Admitting: Pulmonary Disease

## 2023-06-19 ENCOUNTER — Encounter: Payer: Self-pay | Admitting: *Deleted

## 2023-06-19 VITALS — BP 98/62 | HR 61 | Resp 16 | Ht 62.0 in | Wt 155.3 lb

## 2023-06-19 DIAGNOSIS — J189 Pneumonia, unspecified organism: Secondary | ICD-10-CM

## 2023-06-19 DIAGNOSIS — G4733 Obstructive sleep apnea (adult) (pediatric): Secondary | ICD-10-CM | POA: Diagnosis not present

## 2023-06-19 DIAGNOSIS — R059 Cough, unspecified: Secondary | ICD-10-CM | POA: Diagnosis not present

## 2023-06-19 MED ORDER — PREDNISONE 10 MG PO TABS: ORAL_TABLET | ORAL | 0 refills | Status: DC

## 2023-06-19 NOTE — Patient Instructions (Addendum)
X CXR -follow up pneumonia  X Prednisone 10 mg tabs  Take 2 tabs daily with food x 7ds, then 1 tab daily with food x 7ds then STOP  Delsym cough syrup OTC 5ml thrice daily as needed  x drop EPAP minimum to 6 cm

## 2023-06-19 NOTE — Progress Notes (Signed)
Subjective:    Patient ID: Jessica [name], female    DOB: 1950-12-15, 72 y.o.   MRN: [id number]  HPI  72 yo for follow-up of OSA and dyspnea   PMH -Crohn's disease, hypertension, hyperlipidemia, CKD stage III, moderate MR -schwannoma RT ear -easy bruising >>mild thrombocytopenia and iron deficiency   She did not tolerate CPAP well and after a titration study in April 2024, we switched her to BiPAP. On her last visit 03/2023 we noted treatment emergent central apneas 05/2023 she has not been using her BiPAP due to a Crohns flare up   11/28 ED visit >> suspected allergic reaction to an infusion of Entivyo, receiving her second infusion of this medication on 11/13  She has a negative troponin, she had progressive symptoms for almost 2 weeks, elevation in her creatinine at 2.09. Over the past 3 months her creatinine has been in the range of 1.38-1.56, suggesting significant dehydration. Her respiratory panel is negative  Chest x-ray with prominent interstitial opacities  given an IV dose of Decadron, started on doxycycline to cover her for possible atypical pneumonia. She will also be prescribed a short pulse dose of prednisone for 4 days    Discussed the use of AI scribe software for clinical note transcription with the patient, who gave verbal consent to proceed.  History of Present Illness   The patient, with a history of Crohn's disease and sleep apnea, presents with ongoing struggles with her Crohn's disease. She reports experiencing side effects from her new medication, Entyvio, which she takes via infusions. These side effects have been severe enough to prompt discussions with her GI doctors about possibly changing her medication. She also recently had an episode of atypical pneumonia, which she believes was made more likely by her medication.  In addition to her Crohn's disease, the patient has been experiencing a persistent dry cough and shortness of breath. These symptoms have been  severe enough to interfere with her ability to use her BiPAP machine for her sleep apnea. She reports that the cough is dry and nothing is coming up. She also notes that she experiences shortness of breath when walking or doing activities like walking through the mall.  The patient's symptoms have been severe enough to impact her quality of life. She reports feeling very tired and has been taking iron medicine due to low iron levels. She also notes that she has been unable to fly out to meet her family due to her symptoms.      BiPAP download shows poor compliance, average pressure of 14/10 on auto bilevel settings with EPAP minimum of 8 cm  Medications Copy/Paste - Entyvio (infusions) - Prednisone - Iron medicine - Trelegy - Delsym (cough syrup) Significant tests/ events reviewed   10/2022 CPAP titration >> BiPAP 25/21 01/2021 HST showed mod  OSA with AHI 28/ hr 03/2021 TEE mod MR  Review of Systems neg for any significant sore throat, dysphagia, itching, sneezing, nasal congestion or excess/ purulent secretions, fever, chills, sweats, unintended wt loss, pleuritic or exertional cp, hempoptysis, orthopnea pnd or change in chronic leg swelling. Also denies presyncope, palpitations, heartburn, abdominal pain, nausea, vomiting, diarrhea or change in bowel or urinary habits, dysuria,hematuria, rash, arthralgias, visual complaints, headache, numbness weakness or ataxia.     Objective:   Physical Exam  Gen. Pleasant, well-nourished, in no distress ENT - no thrush, no pallor/icterus,no post nasal drip, hard of hearing Neck: No JVD, no thyromegaly, no carotid bruits Lungs: no use of accessory  muscles, no dullness to percussion,Lt basal rales no rhonchi  Cardiovascular: Rhythm regular, heart sounds  normal, no murmurs or gallops, no peripheral edema Musculoskeletal: No deformities, no cyanosis or clubbing        Assessment & Plan:   Assessment and Plan    Atypical Pneumonia Persistent  dry cough, shortness of breath, and fatigue. Recent chest x-ray showed signs of atypical pneumonia. Symptoms not fully resolved despite initial prednisone treatment. No fever. Using nebulizer and Trelegy inhaler; concerned about potential steroid-related throat issues. Advised to rinse mouth after use. - Order repeat chest x-ray - Prescribe longer course of low-dose prednisone - Recommend over-the-counter Delsym for cough - Consider antibiotics if x-ray indicates persistent infection  OSA Unable to use BiPAP due to coughing and vomiting. Reports air pressure feels too strong and causes dry mouth. Discussed adjusting settings to lower air pressure for improved comfort and compliance. - Adjust BiPAP settings to lower air pressure - Document usage issues for insurance purposes - Encourage BiPAP use when coughing is controlled  Crohn's Disease Experiencing flare-up. Currently on Entyvio infusions with significant side effects, including increased infection susceptibility. GI team considering alternative treatments. Low iron levels, taking supplements. Discussed high cost of alternative treatments and need for insurance coverage. - Continue iron supplementation - Follow up with GI team for potential medication change - Document side effects and treatment challenges for insurance purposes  Follow-up - Schedule follow-up appointment after chest x-ray results - Advise to monitor symptoms and avoid travel if not fully recovered.      Drop EPAP minimum to 6 cm to help with compliance  Addendum: Chest x-ray today does not show any new infiltrates

## 2023-06-20 DIAGNOSIS — H524 Presbyopia: Secondary | ICD-10-CM | POA: Diagnosis not present

## 2023-06-20 DIAGNOSIS — H3540 Unspecified peripheral retinal degeneration: Secondary | ICD-10-CM | POA: Diagnosis not present

## 2023-06-23 ENCOUNTER — Telehealth (INDEPENDENT_AMBULATORY_CARE_PROVIDER_SITE_OTHER): Payer: Self-pay | Admitting: *Deleted

## 2023-06-23 NOTE — Telephone Encounter (Signed)
Left message that she was returning a call --ready to schedule those test--It appears that Jessica [name] left message 12/11  [phone]

## 2023-06-23 NOTE — Telephone Encounter (Signed)
Pt returned message on voicemail. Pt states to send her something through my chart. Will placed pt on schedule for 07/18/23 at 10:15am. Will send message through my chart to see if that day works for [place].

## 2023-06-23 NOTE — Telephone Encounter (Signed)
07/10/23 is for an office visit with [facility]. Please reschedule. Thank you

## 2023-06-23 NOTE — Telephone Encounter (Signed)
Patient called back and said 1/2 will not work because her husband has to work but around 1/8 would be better.  [phone].

## 2023-06-23 NOTE — Telephone Encounter (Signed)
Returned call to patient and left message to return call

## 2023-06-23 NOTE — Telephone Encounter (Signed)
Pt left message returning call.  Returned call to patient but had to leave a Engineer, technical sales

## 2023-06-25 ENCOUNTER — Telehealth: Payer: Self-pay | Admitting: Gastroenterology

## 2023-06-25 NOTE — Telephone Encounter (Signed)
Pt scheduled an appt and was asking does she need to get blood work done before that appt.  The appt is 1/6

## 2023-06-26 ENCOUNTER — Other Ambulatory Visit: Payer: Medicare Other

## 2023-06-26 ENCOUNTER — Encounter: Payer: Self-pay | Admitting: Podiatry

## 2023-06-26 ENCOUNTER — Ambulatory Visit (INDEPENDENT_AMBULATORY_CARE_PROVIDER_SITE_OTHER): Payer: Medicare Other

## 2023-06-26 ENCOUNTER — Ambulatory Visit (INDEPENDENT_AMBULATORY_CARE_PROVIDER_SITE_OTHER): Payer: Medicare Other | Admitting: Podiatry

## 2023-06-26 DIAGNOSIS — M8589 Other specified disorders of bone density and structure, multiple sites: Secondary | ICD-10-CM | POA: Diagnosis not present

## 2023-06-26 DIAGNOSIS — M79671 Pain in right foot: Secondary | ICD-10-CM | POA: Diagnosis not present

## 2023-06-26 DIAGNOSIS — S92354A Nondisplaced fracture of fifth metatarsal bone, right foot, initial encounter for closed fracture: Secondary | ICD-10-CM

## 2023-06-26 MED ORDER — HYDROCODONE-ACETAMINOPHEN 5-325 MG PO TABS: 1.0000 | ORAL_TABLET | Freq: Four times a day (QID) | ORAL | 0 refills | Status: DC | PRN

## 2023-06-26 NOTE — Telephone Encounter (Signed)
I believe patient was calling back to schedule her upper endoscopy with Dr. Levon Hedger.  This was discussed at her last office visit for her to schedule an EGD with dilation in January with Dr. Levon Hedger to follow-up on reflux, esophagitis, and dysphagia. ASA 3  She also does need an office visit with me which is scheduled for 1/9.  Brooke Bonito, MSN, APRN, FNP-BC, AGACNP-BC [facility] at [facility]

## 2023-06-26 NOTE — Telephone Encounter (Signed)
Pt has OV with Clemon Chambers 1/9 she does not need blood work. She has procedure on 1/10

## 2023-06-26 NOTE — Progress Notes (Signed)
Chief Complaint  Patient presents with   Foot Pain    Patient states she fell the other day on her right foot and she has a knot on the right side of her foot and she hit it when she fell. Patient will like something to take the pain away so she can go on her vacation. Patient is taking medication for pain but it does not take it away It hurts to wears shoes because it puts pressure against it.    HPI: 72 y.o. female presents today with right foot pain.  Patient states that 3 days ago she got her foot caught in sliding door, fell and twisted.  She has been experiencing intermittent pain and soreness to the lateral midfoot since then with some associated swelling.  Reports pain with shoe gear secondary due to pressure. Does see Dr. Al Corpus for plantar fasciitis, this has been doing okay,  she does take gabapentin for neuropathy.  Past Medical History:  Diagnosis Date   Anxiety attack    Arthritis    Back pain, chronic    Chronic kidney disease    Crohn's colitis (HCC)    Depression (emotion)    Dysrhythmia    GERD (gastroesophageal reflux disease)    Hypertension    Hypothyroid    Iron deficiency anemia, unspecified    Neuropathy    Sleep apnea    cpap is not working per patient - needs to find a new doctor - not used since 7/16 per pat    Stage 3 chronic kidney disease (HCC)    Vertigo    followed by Dr Angelena Sole- in [place]     Past Surgical History:  Procedure Laterality Date   ABDOMINAL HYSTERECTOMY     BIOPSY  06/08/2018   Procedure: BIOPSY;  Surgeon: Malissa Hippo, MD;  [facility];  Service: Endoscopy;;  (colon)   BIOPSY  04/01/2023   Procedure: BIOPSY;  Surgeon: Dolores Frame, MD;  [facility];  Service: Gastroenterology;;   CHOLECYSTECTOMY     COLON SURGERY     for Crohn's in the 87s in [place]   COLONOSCOPY N/A 03/11/2013   Procedure: COLONOSCOPY;  Surgeon: Malissa Hippo, MD;  [facility];  Service: Endoscopy;   Laterality: N/A;  1200   COLONOSCOPY N/A 06/08/2018   Procedure: COLONOSCOPY;  Surgeon: Malissa Hippo, MD;  [facility];  Service: Endoscopy;  Laterality: N/A;  8:25   COLONOSCOPY WITH PROPOFOL N/A 04/01/2023   Procedure: COLONOSCOPY WITH PROPOFOL;  Surgeon: Dolores Frame, MD;  [facility];  Service: Gastroenterology;  Laterality: N/A;   ESOPHAGEAL BRUSHING  04/01/2023   Procedure: ESOPHAGEAL BRUSHING;  Surgeon: Marguerita Merles, Reuel Boom, MD;  [facility];  Service: Gastroenterology;;   ESOPHAGOGASTRODUODENOSCOPY (EGD) WITH PROPOFOL N/A 04/01/2023   Procedure: ESOPHAGOGASTRODUODENOSCOPY (EGD) WITH PROPOFOL;  Surgeon: Dolores Frame, MD;  [facility];  Service: Gastroenterology;  Laterality: N/A;  1:00am;asa 3   fatty tumor removed     HARDWARE REMOVAL Right 08/07/2016   Procedure: HARDWARE REMOVAL;  Surgeon: Ollen Gross, MD;  [facility]: [facility];  Service: Orthopedics;  Laterality: Right;   Hip replacement      Rt hip in 2010 in [place]   KNEE ARTHROSCOPY Left 03/29/2015   Procedure: ARTHROSCOPY LEFT KNEE WITH MENSICAL DEBRIDEMENT, chondroplasty;  Surgeon: Ollen Gross, MD;  [facility];  Service: Orthopedics;  Laterality: Left;   ORIF TIBIA  PLATEAU Right 02/29/2016   Procedure: OPEN REDUCTION INTERNAL FIXATION (ORIF RIGHT  TIBIAL PLATEAU FRACTURE;  Surgeon: Ollen Gross, MD;  [facility];  Service: Orthopedics;  Laterality: Right;   POLYPECTOMY  04/01/2023   Procedure: POLYPECTOMY;  Surgeon: Dolores Frame, MD;  [facility];  Service: Gastroenterology;;   TEE WITHOUT CARDIOVERSION N/A 03/27/2021   Procedure: TRANSESOPHAGEAL ECHOCARDIOGRAM (TEE);  Surgeon: Antoine Poche, MD;  [facility];  Service: Endoscopy;  Laterality: N/A;   TOTAL KNEE ARTHROPLASTY Right 08/25/2017   Procedure: TOTAL RIGHT KNEE ARTHROPLASTY;  Surgeon: Ollen Gross, MD;  [facility];  Service:  Orthopedics;  Laterality: Right;    Allergies  Allergen Reactions   Morphine And Codeine Hives and Itching    May cause blood pressure to drop   Robaxin [Methocarbamol] Itching   Sulfa Antibiotics Itching    ALL SULFA DRUGS    ROS    Physical Exam: There were no vitals filed for this visit.  General: The patient is alert and oriented x3 in no acute distress.  Dermatology: Skin is warm, dry and supple bilateral lower extremities. Interspaces are clear of maceration and debris.  No ecchymosis appreciated.  Vascular: Palpable pedal pulses bilaterally. Capillary refill within normal limits.  Localized edema right lateral midfoot.  Neurological: Light touch sensation grossly intact bilateral feet.   Musculoskeletal Exam: Pain on palpation of right foot fifth met base.  Decreased strength with resisted eversion and dorsiflexion.  Focal edema here.  Radiographic Exam: Right foot 06/26/2023 Decreased osseous mineralization appreciated.  Nondisplaced fracture of fifth met base at metaphyseal junction. Fracture line likely extending into intermetatarsal joint/TMT joint.  Assessment/Plan of Care: 1. Right foot pain   2. Nondisplaced fracture of fifth metatarsal bone, right foot, initial encounter for closed fracture   3. Osteopenia of multiple sites      Meds ordered this encounter  Medications   HYDROcodone-acetaminophen (NORCO/VICODIN) 5-325 MG tablet    Sig: Take 1 tablet by mouth every 6 (six) hours as needed for up to 20 doses for moderate pain (pain score 4-6).    Dispense:  20 tablet    Refill:  0   None  Discussed clinical findings with patient today.  Plan: - Clinical findings and treatment plan discussed with patient - Right foot placed in cam walker boot today.  Advised limited stance and gait. She may benefit from the use of a rollator -Advised patient that these injuries can be managed conservatively or with surgery. Due to her age, bone quality, concurrent  medication use for Crohn's disease, concern for TIA, concern for ability to stay NWB as a fall risk, do think conservative management may be more appropriate -Send prescription for short course of norco for pain, patient cannot take NSAIDs due to kidney disease and takes this medication chronically, she will get this refilled by her regular prescribing doctor -Emphasized the need for limited stance and gait.  Patient does state that she is traveling to family for the holiday. -Check vitamin D levels, plan to supplement vitamin D as needed -Follow-up in about 2 weeks to discuss proceeding with conservative versus surgical management.  Consider rollator and bone stimulator at this time. Follow up xrays at this time to check alignment.   Zyden Suman L. Marchia Bond, AACFAS [facility]     [address]  [place], [place] [address]                Office [phone]  Fax [phone]   Boot, follow up in 2 weeks for re-eval

## 2023-06-26 NOTE — Telephone Encounter (Signed)
Patient called and told that she didn't need blood work and she understood.

## 2023-06-26 NOTE — Telephone Encounter (Signed)
Patient and I had been communication through [employer] and agreed on 07/18/23. Instructions sent via my chart.

## 2023-06-27 ENCOUNTER — Inpatient Hospital Stay: Payer: Medicare Other

## 2023-07-03 ENCOUNTER — Inpatient Hospital Stay: Payer: Medicare Other | Admitting: Oncology

## 2023-07-07 ENCOUNTER — Encounter: Payer: Self-pay | Admitting: Hematology

## 2023-07-07 ENCOUNTER — Other Ambulatory Visit: Payer: Self-pay

## 2023-07-07 ENCOUNTER — Encounter: Payer: Self-pay | Admitting: Gastroenterology

## 2023-07-07 DIAGNOSIS — R0981 Nasal congestion: Secondary | ICD-10-CM | POA: Diagnosis not present

## 2023-07-07 DIAGNOSIS — Z299 Encounter for prophylactic measures, unspecified: Secondary | ICD-10-CM | POA: Diagnosis not present

## 2023-07-07 DIAGNOSIS — K5000 Crohn's disease of small intestine without complications: Secondary | ICD-10-CM

## 2023-07-07 DIAGNOSIS — I10 Essential (primary) hypertension: Secondary | ICD-10-CM | POA: Diagnosis not present

## 2023-07-07 DIAGNOSIS — D509 Iron deficiency anemia, unspecified: Secondary | ICD-10-CM

## 2023-07-07 DIAGNOSIS — J069 Acute upper respiratory infection, unspecified: Secondary | ICD-10-CM | POA: Diagnosis not present

## 2023-07-08 ENCOUNTER — Encounter: Payer: Self-pay | Admitting: Hematology

## 2023-07-08 ENCOUNTER — Inpatient Hospital Stay: Payer: Medicare Other | Attending: Hematology

## 2023-07-08 ENCOUNTER — Encounter: Payer: Self-pay | Admitting: Gastroenterology

## 2023-07-08 DIAGNOSIS — R109 Unspecified abdominal pain: Secondary | ICD-10-CM | POA: Diagnosis not present

## 2023-07-08 DIAGNOSIS — R1110 Vomiting, unspecified: Secondary | ICD-10-CM | POA: Insufficient documentation

## 2023-07-08 DIAGNOSIS — K5900 Constipation, unspecified: Secondary | ICD-10-CM | POA: Diagnosis not present

## 2023-07-08 DIAGNOSIS — K5090 Crohn's disease, unspecified, without complications: Secondary | ICD-10-CM | POA: Insufficient documentation

## 2023-07-08 DIAGNOSIS — D649 Anemia, unspecified: Secondary | ICD-10-CM | POA: Insufficient documentation

## 2023-07-08 DIAGNOSIS — R059 Cough, unspecified: Secondary | ICD-10-CM | POA: Diagnosis not present

## 2023-07-08 DIAGNOSIS — R42 Dizziness and giddiness: Secondary | ICD-10-CM | POA: Diagnosis not present

## 2023-07-08 DIAGNOSIS — Z8249 Family history of ischemic heart disease and other diseases of the circulatory system: Secondary | ICD-10-CM | POA: Diagnosis not present

## 2023-07-08 DIAGNOSIS — Z818 Family history of other mental and behavioral disorders: Secondary | ICD-10-CM | POA: Diagnosis not present

## 2023-07-08 DIAGNOSIS — R0602 Shortness of breath: Secondary | ICD-10-CM | POA: Diagnosis not present

## 2023-07-08 DIAGNOSIS — R233 Spontaneous ecchymoses: Secondary | ICD-10-CM | POA: Insufficient documentation

## 2023-07-08 DIAGNOSIS — G479 Sleep disorder, unspecified: Secondary | ICD-10-CM | POA: Diagnosis not present

## 2023-07-08 DIAGNOSIS — M791 Myalgia, unspecified site: Secondary | ICD-10-CM | POA: Insufficient documentation

## 2023-07-08 DIAGNOSIS — R197 Diarrhea, unspecified: Secondary | ICD-10-CM | POA: Diagnosis not present

## 2023-07-08 DIAGNOSIS — R5383 Other fatigue: Secondary | ICD-10-CM | POA: Diagnosis not present

## 2023-07-08 DIAGNOSIS — M810 Age-related osteoporosis without current pathological fracture: Secondary | ICD-10-CM | POA: Diagnosis not present

## 2023-07-08 DIAGNOSIS — D509 Iron deficiency anemia, unspecified: Secondary | ICD-10-CM

## 2023-07-08 DIAGNOSIS — R110 Nausea: Secondary | ICD-10-CM | POA: Diagnosis not present

## 2023-07-08 DIAGNOSIS — Z79899 Other long term (current) drug therapy: Secondary | ICD-10-CM | POA: Insufficient documentation

## 2023-07-08 DIAGNOSIS — Z8379 Family history of other diseases of the digestive system: Secondary | ICD-10-CM | POA: Diagnosis not present

## 2023-07-08 DIAGNOSIS — K5000 Crohn's disease of small intestine without complications: Secondary | ICD-10-CM

## 2023-07-08 LAB — CBC WITH DIFFERENTIAL/PLATELET
Abs Immature Granulocytes: 0.03 10*3/uL (ref 0.00–0.07)
Basophils Absolute: 0 10*3/uL (ref 0.0–0.1)
Basophils Relative: 0 %
Eosinophils Absolute: 0 10*3/uL (ref 0.0–0.5)
Eosinophils Relative: 0 %
HCT: 31.9 % — ABNORMAL LOW (ref 36.0–46.0)
Hemoglobin: 11 g/dL — ABNORMAL LOW (ref 12.0–15.0)
Immature Granulocytes: 1 %
Lymphocytes Relative: 10 %
Lymphs Abs: 0.5 10*3/uL — ABNORMAL LOW (ref 0.7–4.0)
MCH: 33 pg (ref 26.0–34.0)
MCHC: 34.5 g/dL (ref 30.0–36.0)
MCV: 95.8 fL (ref 80.0–100.0)
Monocytes Absolute: 0.3 10*3/uL (ref 0.1–1.0)
Monocytes Relative: 6 %
Neutro Abs: 4.2 10*3/uL (ref 1.7–7.7)
Neutrophils Relative %: 83 %
Platelets: 154 10*3/uL (ref 150–400)
RBC: 3.33 MIL/uL — ABNORMAL LOW (ref 3.87–5.11)
RDW: 12 % (ref 11.5–15.5)
WBC: 5 10*3/uL (ref 4.0–10.5)
nRBC: 0 % (ref 0.0–0.2)

## 2023-07-08 LAB — COMPREHENSIVE METABOLIC PANEL
ALT: 20 U/L (ref 0–44)
AST: 26 U/L (ref 15–41)
Albumin: 3.7 g/dL (ref 3.5–5.0)
Alkaline Phosphatase: 76 U/L (ref 38–126)
Anion gap: 10 (ref 5–15)
BUN: 24 mg/dL — ABNORMAL HIGH (ref 8–23)
CO2: 17 mmol/L — ABNORMAL LOW (ref 22–32)
Calcium: 9.6 mg/dL (ref 8.9–10.3)
Chloride: 110 mmol/L (ref 98–111)
Creatinine, Ser: 1.79 mg/dL — ABNORMAL HIGH (ref 0.44–1.00)
GFR, Estimated: 30 mL/min — ABNORMAL LOW (ref >60)
Glucose, Bld: 141 mg/dL — ABNORMAL HIGH (ref 70–99)
Potassium: 4.2 mmol/L (ref 3.5–5.1)
Sodium: 137 mmol/L (ref 135–145)
Total Bilirubin: 1 mg/dL (ref 0.0–1.2)
Total Protein: 7.2 g/dL (ref 6.5–8.1)

## 2023-07-08 LAB — IRON AND TIBC
Iron: 133 ug/dL (ref 28–170)
Saturation Ratios: 46 % — ABNORMAL HIGH (ref 10.4–31.8)
TIBC: 287 ug/dL (ref 250–450)
UIBC: 154 ug/dL

## 2023-07-08 LAB — FERRITIN: Ferritin: 337 ng/mL — ABNORMAL HIGH (ref 11–307)

## 2023-07-10 ENCOUNTER — Telehealth: Payer: Self-pay

## 2023-07-10 ENCOUNTER — Inpatient Hospital Stay: Payer: Medicare Other | Attending: Hematology | Admitting: Oncology

## 2023-07-10 ENCOUNTER — Ambulatory Visit: Payer: Medicare Other | Admitting: Gastroenterology

## 2023-07-10 ENCOUNTER — Other Ambulatory Visit: Payer: Self-pay

## 2023-07-10 VITALS — BP 118/65 | HR 80 | Temp 97.6°F | Resp 18 | Wt 151.9 lb

## 2023-07-10 DIAGNOSIS — Z9049 Acquired absence of other specified parts of digestive tract: Secondary | ICD-10-CM | POA: Insufficient documentation

## 2023-07-10 DIAGNOSIS — Z8379 Family history of other diseases of the digestive system: Secondary | ICD-10-CM | POA: Insufficient documentation

## 2023-07-10 DIAGNOSIS — K2100 Gastro-esophageal reflux disease with esophagitis, without bleeding: Secondary | ICD-10-CM | POA: Insufficient documentation

## 2023-07-10 DIAGNOSIS — Z79899 Other long term (current) drug therapy: Secondary | ICD-10-CM | POA: Insufficient documentation

## 2023-07-10 DIAGNOSIS — K5080 Crohn's disease of both small and large intestine without complications: Secondary | ICD-10-CM | POA: Insufficient documentation

## 2023-07-10 DIAGNOSIS — Z882 Allergy status to sulfonamides status: Secondary | ICD-10-CM | POA: Insufficient documentation

## 2023-07-10 DIAGNOSIS — R5383 Other fatigue: Secondary | ICD-10-CM | POA: Insufficient documentation

## 2023-07-10 DIAGNOSIS — K5900 Constipation, unspecified: Secondary | ICD-10-CM | POA: Insufficient documentation

## 2023-07-10 DIAGNOSIS — F32A Depression, unspecified: Secondary | ICD-10-CM | POA: Diagnosis not present

## 2023-07-10 DIAGNOSIS — Z885 Allergy status to narcotic agent status: Secondary | ICD-10-CM | POA: Diagnosis not present

## 2023-07-10 DIAGNOSIS — D649 Anemia, unspecified: Secondary | ICD-10-CM | POA: Insufficient documentation

## 2023-07-10 DIAGNOSIS — K648 Other hemorrhoids: Secondary | ICD-10-CM | POA: Insufficient documentation

## 2023-07-10 DIAGNOSIS — R0602 Shortness of breath: Secondary | ICD-10-CM | POA: Insufficient documentation

## 2023-07-10 DIAGNOSIS — R059 Cough, unspecified: Secondary | ICD-10-CM | POA: Diagnosis not present

## 2023-07-10 DIAGNOSIS — N1832 Chronic kidney disease, stage 3b: Secondary | ICD-10-CM | POA: Insufficient documentation

## 2023-07-10 DIAGNOSIS — Z90710 Acquired absence of both cervix and uterus: Secondary | ICD-10-CM | POA: Insufficient documentation

## 2023-07-10 NOTE — Telephone Encounter (Signed)
 Auth Submission: NO AUTH NEEDED Site of care: Site of care: [facility] Payer: Medicare a/b, mutual of [place] supp Medication & CPT/J Code(s) submitted: Entyvio  (Vedolizumab ) J3380 Route of submission (phone, fax, portal): phone Phone # Fax # Auth type: Buy/Bill HB Units/visits requested: 300mg , initial dose then q8 weeks Reference number:  Approval from: 07/10/23 to 07/07/24

## 2023-07-10 NOTE — Progress Notes (Signed)
 [facility] [address] [place] [address]   Clinic Day:  07/10/23   Referring physician: [name], MD  Patient Care Team: [name], MD as PCP - General (Internal Medicine) Alvan Dorn FALCON, MD as PCP - Cardiology (Cardiology) Melodi Lerner, MD as Consulting Physician (Orthopedic Surgery)   ASSESSMENT & PLAN:   Assessment:  1.  Easy bruising and normocytic anemia: - She reports easy bruising for 5 years, worse lately on the upper extremities.  No nosebleeds or other bleeding reported.  She had right knee surgery 2 years ago and did not have any extra bleeding.  Denies any B symptoms.  She has been on Zoloft  for 15 years.  Aspirin  81 mg daily was started 1 year ago.  She does get steroid injections for her back and feet almost every 3 months for a long time. - She denies any bleeding per rectum or melena.  No ice pica. - She has Crohn's disease and is followed closely by gastroenterology.  Most recently had to Entyvio  infusions but did not tolerate well. - Colonoscopy (06/08/2018): 3 mm fistula at the anastomosis.  Normal ileum.  Internal hemorrhoids.  Mucosal ulceration.  2. Social/Family history: -She lives with husband at home.  She retired after working at [employer].  She also did other factory work.  No tobacco use or chemical exposures.  -No family history of bleeding issues. No family history of cancer. Brother has Crohn's.   Plan:  1.  Easy bruising: -Platelet count has been intermittently low since 2019 ranging from 107-196.  - Labs from 07/08/2023 show her platelets have now normalized at 154,000. -Previous hematology workup included normal PT, PTT and fibrinogen  level.  Von Willebrand panel was slightly elevated secondary to chronic inflammation.  No evidence of hemolysis or infection. -Continue to monitor.  2.  Normocytic anemia: - Normocytic anemia likely multifactorial in the setting of CKD, iron  deficiency and history of Crohn's disease. -Hematology workup from 02/12/2023 did not reveal evidence of infiltrative process with no evidence of M spike or hemolysis. - Colonoscopy/EGD from 04/01/2023 revealed known Crohn's disease with ileitis.  No inflammation was found.  Three 4 to 12 mm polyps in transverse colon that were removed.  Nonbleeding internal hemorrhoids.  EGD showed esophagitis without bleeding, 4 cm hiatal hernia with otherwise normal stomach and duodenum.  She was started on Protonix . -She received 1 g Monoferric  on 03/14/2023 with good tolerance. -Labs from 07/08/2023 show hemoglobin of 11.0 (11.5), MCV 95.8, creatinine 1.79 (2.09), ferritin 337, iron saturations 46% and TIBC 287. - We discussed that although she is very symptomatic with fatigue, weakness and recurrent infections it is likely multifactorial.  Labs suggest adequate iron but she is interested in additional IV iron.  We agreed 1 additional dose of IV iron would be okay followed by repeat labs in approximately 10 weeks.  3.  Crohn's disease: -Has had intermittent flares over the years but not an active flare lasting this long.  Has taken oral medications in the past such as steroids and Lyalda.   -Most recent colonoscopy from 04/01/2023 showed Crohn's disease with ileitis, congestion/edema, erosion, erythema found in neoterminal ileum with associated narrowing of the lumen status post biopsy.  Pathology showed active neutrophilic inflammation.  - She received 2 Entyvio  infusions but was unable to tolerate.  She developed severe fatigue with recurrent infections.  She was hospitalized in November for atypical pneumonia and was recently prescribed Levaquin  for a sinus infection.  She is on day 4 of 7 days of Levaquin.  She is also on steroids. -She has scheduled follow-up for next week to discuss other options for her Crohn's. -Reports overall stable when she takes Bentyl  and Imodium . -She will have repeat EGD/colonoscopy in 3  months with Dr. Eartha.  This is scheduled for 07/19/2022.  4.  Recurrent infections: -Hospitalized for pneumonia in November 2024. -Currently being treated with Levaquin and prednisone  taper for sinus infection. -We discussed recurrent infections are likely secondary to Entyvio  given it does suppress her immune system.  She is also been on several courses of antibiotics and steroids.   No orders of the defined types were placed in this encounter.  PLAN SUMMARY: >> Continue follow-up per GI/PCP for Crohn's disease. >> 1 additional dose of IV iron given severity of symptoms. >> Return to clinic in 8 weeks for follow-up with labs a few days before.     I spent 20 minutes dedicated to the care of this patient (face-to-face and non-face-to-face) on the date of the encounter to include what is described in the assessment and plan.   Delon FORBES Hope, NP   1/2/20259:13 AM  CHIEF COMPLAINT/PURPOSE OF CONSULT:   Diagnosis: Normocytic anemia, easy bruising  Current Therapy: Monoferric   HISTORY OF PRESENT ILLNESS:   Domino is a 73 y.o. female presenting to clinic today for follow-up for anemia and easy bruising.  She was last seen in clinic by me on 04/24/2023.  She received 1 dose of Monoferric  on 03/14/2023.  Developed Crohn's flare following IV iron.  She was started on interview infusions with GI and received 2.  Unfortunately, unable to tolerate secondary to severe fatigue and recurrent infections.  She was hospitalized for atypical pneumonia, renal insufficiency and dehydration.  Chest x-ray showed interstitial opacities.  Given she was on immunosuppressant infusions she was treated with doxycycline  and steroids.  She was evaluated on 07/05/2023 by PCP and started on Levaquin and a steroid taper for sinus infection.  She cannot understand why she feels so poorly.  Reports feeling severely fatigued all the time.  Reports she is feeling slightly better after starting the antibiotics  but continues to be short of breath and weak.  She recently returned from [place]  after spending the holidays with her family and states she was almost unable to make it through the airport.  She continues to deny any bright red blood per rectum, melena or hematochezia.  Reports that she takes her Bentyl  and Imodium , she does not have any significant diarrhea.  Occasionally she has constipation and has to skip a day.  She has follow-up with GI next week and they hope to discuss starting a different medication for Crohn's; possibly Remicade.  Appetite is 80% energy levels are very low.  She has a residual cough and shortness of breath from pneumonia and now sinus infection.  Has rotating diarrhea and constipation with some nausea and vomiting.  Has trouble sleeping.  Feels dizzy when she stands.   PAST MEDICAL HISTORY:   Past Medical History: Past Medical History:  Diagnosis Date   Anxiety attack    Arthritis    Back pain, chronic    Chronic kidney disease    Crohn's colitis (HCC)    Depression (emotion)    Dysrhythmia    GERD (gastroesophageal reflux disease)    Hypertension    Hypothyroid    Iron deficiency anemia, unspecified    Neuropathy    Sleep apnea  cpap is not working per patient - needs to find a new doctor - not used since 7/16 per pat    Stage 3 chronic kidney disease (HCC)    Vertigo    followed by Dr Kievie- in [place]     Surgical History: Past Surgical History:  Procedure Laterality Date   ABDOMINAL HYSTERECTOMY     BIOPSY  06/08/2018   Procedure: BIOPSY;  Surgeon: Golda Claudis PENNER, MD;  [facility];  Service: Endoscopy;;  (colon)   BIOPSY  04/01/2023   Procedure: BIOPSY;  Surgeon: Eartha Angelia Sieving, MD;  [facility];  Service: Gastroenterology;;   CHOLECYSTECTOMY     COLON SURGERY     for Crohn's in the 80s in [place]    COLONOSCOPY N/A 03/11/2013   Procedure: COLONOSCOPY;  Surgeon: Claudis PENNER Golda, MD;  [facility];   Service: Endoscopy;  Laterality: N/A;  1200   COLONOSCOPY N/A 06/08/2018   Procedure: COLONOSCOPY;  Surgeon: Golda Claudis PENNER, MD;  [facility];  Service: Endoscopy;  Laterality: N/A;  8:25   COLONOSCOPY WITH PROPOFOL  N/A 04/01/2023   Procedure: COLONOSCOPY WITH PROPOFOL ;  Surgeon: Eartha Angelia Sieving, MD;  [facility];  Service: Gastroenterology;  Laterality: N/A;   ESOPHAGEAL BRUSHING  04/01/2023   Procedure: ESOPHAGEAL BRUSHING;  Surgeon: Eartha Angelia, Sieving, MD;  [facility];  Service: Gastroenterology;;   ESOPHAGOGASTRODUODENOSCOPY (EGD) WITH PROPOFOL  N/A 04/01/2023   Procedure: ESOPHAGOGASTRODUODENOSCOPY (EGD) WITH PROPOFOL ;  Surgeon: Eartha Angelia Sieving, MD;  [facility];  Service: Gastroenterology;  Laterality: N/A;  1:00am;asa 3   fatty tumor removed     HARDWARE REMOVAL Right 08/07/2016   Procedure: HARDWARE REMOVAL;  Surgeon: Dempsey Moan, MD;  [facility];  Service: Orthopedics;  Laterality: Right;   Hip replacement      Rt hip in 2010 in [place]   KNEE ARTHROSCOPY Left 03/29/2015   Procedure: ARTHROSCOPY LEFT KNEE WITH MENSICAL DEBRIDEMENT, chondroplasty;  Surgeon: Dempsey Moan, MD;  [facility];  Service: Orthopedics;  Laterality: Left;   ORIF TIBIA PLATEAU Right 02/29/2016   Procedure: OPEN REDUCTION INTERNAL FIXATION (ORIF RIGHT  TIBIAL PLATEAU FRACTURE;  Surgeon: Dempsey Moan, MD;  [facility];  Service: Orthopedics;  Laterality: Right;   POLYPECTOMY  04/01/2023   Procedure: POLYPECTOMY;  Surgeon: Eartha Angelia Sieving, MD;  [facility];  Service: Gastroenterology;;   TEE WITHOUT CARDIOVERSION N/A 03/27/2021   Procedure: TRANSESOPHAGEAL ECHOCARDIOGRAM (TEE);  Surgeon: Alvan Dorn FALCON, MD;  [facility];  Service: Endoscopy;  Laterality: N/A;   TOTAL KNEE ARTHROPLASTY Right 08/25/2017   Procedure: TOTAL RIGHT KNEE ARTHROPLASTY;  Surgeon: Moan Dempsey, MD;  [facility];  Service: Orthopedics;  Laterality: Right;    Social History: Social History   Socioeconomic History   Marital status: Married    Spouse name: Not on file   Number of children: Not on file   Years of education: Not on file   Highest education level: Not on file  Occupational History   Not on file  Tobacco Use   Smoking status: Never   Smokeless tobacco: Never  Vaping Use   Vaping status: Never Used  Substance and Sexual Activity   Alcohol  use: No    Alcohol /week: 0.0 standard drinks of alcohol    Drug use: No   Sexual activity: Not Currently  Other Topics Concern   Not on file  Social History Narrative   Not on file  Social Drivers of Corporate Investment Banker Strain: Not on file  Food Insecurity: Not on file  Transportation Needs: Not on file  Physical Activity: Not on file  Stress: Not on file  Social Connections: Not on file  Intimate Partner Violence: Not on file    Family History: Family History  Problem Relation Age of Onset   Crohn's disease Brother    Depression Mother    Anxiety disorder Mother    Depression Sister    Aneurysm Father    Colon cancer Neg Hx     Current Medications:  Current Outpatient Medications:    amLODipine  (NORVASC ) 10 MG tablet, Take 0.5 tablets (5 mg total) by mouth daily., Disp: , Rfl:    buPROPion  (WELLBUTRIN  XL) 150 MG 24 hr tablet, TAKE 1 TABLET BY MOUTH ONCE DAILY IN THE MORNING, Disp: 30 tablet, Rfl: 0   dicyclomine  (BENTYL ) 10 MG capsule, TAKE 1 CAPSULE BY MOUTH TWICE DAILY BEFORE A MEAL, Disp: 60 capsule, Rfl: 0   ferrous sulfate  325 (65 FE) MG EC tablet, Take 1 tablet (325 mg total) by mouth daily with breakfast., Disp: 90 tablet, Rfl: 3   gabapentin  (NEURONTIN ) 300 MG capsule, TAKE 1 CAPSULE BY MOUTH THREE TIMES DAILY, Disp: 90 capsule, Rfl: 0   HYDROcodone -acetaminophen  (NORCO/VICODIN) 5-325 MG tablet, Take 1 tablet by mouth every 6 (six) hours as needed for up to 20 doses for moderate pain (pain score 4-6).,  Disp: 20 tablet, Rfl: 0   levothyroxine  (SYNTHROID ) 100 MCG tablet, Take 100 mcg by mouth daily., Disp: , Rfl:    loperamide  (IMODIUM ) 2 MG capsule, TAKE 1 CAPSULE BY MOUTH TWICE DAILY AS NEEDED FOR LOOSE STOOLS, Disp: 180 capsule, Rfl: 1   metoprolol  succinate (TOPROL -XL) 25 MG 24 hr tablet, TAKE 1 & 1/2 (ONE & ONE-HALF) TABLETS BY MOUTH ONCE DAILY, Disp: 45 tablet, Rfl: 3   Multiple Vitamin (MULTIVITAMIN WITH MINERALS) TABS tablet, Take 1 tablet by mouth daily., Disp: , Rfl:    ondansetron  (ZOFRAN -ODT) 4 MG disintegrating tablet, Take 1 tablet (4 mg total) by mouth every 8 (eight) hours as needed., Disp: 20 tablet, Rfl: 0   pantoprazole  (PROTONIX ) 40 MG tablet, Take 1 tablet (40 mg total) by mouth 2 (two) times daily., Disp: 180 tablet, Rfl: 3   potassium chloride  (KLOR-CON ) 10 MEQ tablet, Take 10 mEq by mouth daily., Disp: , Rfl:    predniSONE  (DELTASONE ) 10 MG tablet, Take 2 tabs daily with food for 7 days and the 1 tab daily with food for 7 days then stop, Disp: 21 tablet, Rfl: 0   rOPINIRole  (REQUIP ) 1 MG tablet, Take 1 mg by mouth at bedtime., Disp: , Rfl:    rosuvastatin  (CRESTOR ) 10 MG tablet, Take 1 tablet (10 mg total) by mouth daily., Disp: 90 tablet, Rfl: 2   spironolactone (ALDACTONE) 50 MG tablet, Take 50 mg by mouth daily., Disp: , Rfl:    Allergies: Allergies  Allergen Reactions   Morphine And Codeine Hives and Itching    May cause blood pressure to drop   Robaxin  [Methocarbamol ] Itching   Sulfa Antibiotics Itching    ALL SULFA DRUGS    REVIEW OF SYSTEMS:   Review of Systems  Constitutional:  Positive for appetite change and fatigue. Negative for fever.  Respiratory:  Positive for cough and shortness of breath.   Gastrointestinal:  Positive for abdominal pain, constipation, diarrhea, nausea and vomiting. Negative for blood in stool.  Musculoskeletal:  Positive for myalgias.  Neurological:  Positive for  dizziness.  Psychiatric/Behavioral:  Positive for sleep  disturbance.      VITALS:   There were no vitals taken for this visit.  Wt Readings from Last 3 Encounters:  06/19/23 155 lb 4.8 oz (70.4 kg)  06/05/23 155 lb (70.3 kg)  04/24/23 154 lb 4.8 oz (70 kg)    There is no height or weight on file to calculate BMI.   PHYSICAL EXAM:   Physical Exam Constitutional:      Appearance: Normal appearance.  Cardiovascular:     Rate and Rhythm: Normal rate and regular rhythm.  Pulmonary:     Effort: Pulmonary effort is normal.     Breath sounds: Normal breath sounds.  Abdominal:     General: Bowel sounds are normal.     Palpations: Abdomen is soft.  Musculoskeletal:        General: No swelling. Normal range of motion.  Neurological:     Mental Status: She is alert and oriented to person, place, and time. Mental status is at baseline.     LABS:      Latest Ref Rng & Units 07/08/2023   10:14 AM 06/05/2023    2:27 PM 04/17/2023   10:40 AM  CBC  WBC 4.0 - 10.5 K/uL 5.0  3.9  5.6   Hemoglobin 12.0 - 15.0 g/dL 88.9  88.4  88.7   Hematocrit 36.0 - 46.0 % 31.9  32.9  33.4   Platelets 150 - 400 K/uL 154  140  144       Latest Ref Rng & Units 07/08/2023   10:14 AM 06/05/2023    2:27 PM 04/17/2023   10:40 AM  CMP  Glucose 70 - 99 mg/dL 858  90  898   BUN 8 - 23 mg/dL 24  18  24    Creatinine 0.44 - 1.00 mg/dL 8.20  7.90  8.61   Sodium 135 - 145 mmol/L 137  139  134   Potassium 3.5 - 5.1 mmol/L 4.2  4.8  4.8   Chloride 98 - 111 mmol/L 110  111  107   CO2 22 - 32 mmol/L 17  18  20    Calcium  8.9 - 10.3 mg/dL 9.6  9.8  8.9   Total Protein 6.5 - 8.1 g/dL 7.2  6.9  6.8   Total Bilirubin 0.0 - 1.2 mg/dL 1.0  0.9  0.7   Alkaline Phos 38 - 126 U/L 76  65  63   AST 15 - 41 U/L 26  27  23    ALT 0 - 44 U/L 20  13  17       No results found for: CEA1, CEA / No results found for: CEA1, CEA No results found for: PSA1 No results found for: CAN199 No results found for: RJW874  Lab Results  Component Value Date    TOTALPROTELP 7.0 02/12/2023   TOTALPROTELP 7.0 02/12/2023   ALBUMINELP 4.0 02/12/2023   A1GS 0.2 02/12/2023   A2GS 0.7 02/12/2023   BETS 1.1 02/12/2023   GAMS 1.0 02/12/2023   MSPIKE Not Observed 02/12/2023   SPEI Comment 02/12/2023   Lab Results  Component Value Date   TIBC 287 07/08/2023   TIBC 265 04/17/2023   TIBC 439 02/12/2023   FERRITIN 337 (H) 07/08/2023   FERRITIN 366 (H) 04/17/2023   FERRITIN 31 02/12/2023   IRONPCTSAT 46 (H) 07/08/2023   IRONPCTSAT 52 (H) 04/17/2023   IRONPCTSAT 21 02/12/2023   Lab Results  Component Value Date   LDH 159  02/12/2023     STUDIES:   DG Foot Complete Right Result Date: 07/07/2023 Please see detailed radiograph report in office note.  DG Chest 2 View Result Date: 06/19/2023 CLINICAL DATA:  Cough for 2 weeks. EXAM: CHEST - 2 VIEW COMPARISON:  June 05, 2023. FINDINGS: The heart size and mediastinal contours are within normal limits. Both lungs are clear. The visualized skeletal structures are unremarkable. IMPRESSION: No active cardiopulmonary disease. Electronically Signed   By: Lynwood Landy Raddle M.D.   On: 06/19/2023 11:56

## 2023-07-11 ENCOUNTER — Other Ambulatory Visit: Payer: Self-pay | Admitting: Gastroenterology

## 2023-07-11 ENCOUNTER — Ambulatory Visit: Payer: Medicare Other | Admitting: Podiatry

## 2023-07-14 ENCOUNTER — Ambulatory Visit: Payer: Medicare Other | Admitting: Gastroenterology

## 2023-07-14 ENCOUNTER — Other Ambulatory Visit: Payer: Self-pay

## 2023-07-14 ENCOUNTER — Emergency Department (HOSPITAL_COMMUNITY): Payer: Medicare Other

## 2023-07-14 ENCOUNTER — Observation Stay (HOSPITAL_COMMUNITY)
Admission: EM | Admit: 2023-07-14 | Discharge: 2023-07-16 | Disposition: A | Discharge status: Home Health | Payer: Medicare Other | Attending: Family Medicine | Admitting: Family Medicine

## 2023-07-14 ENCOUNTER — Encounter (HOSPITAL_COMMUNITY): Payer: Self-pay

## 2023-07-14 DIAGNOSIS — E039 Hypothyroidism, unspecified: Secondary | ICD-10-CM | POA: Diagnosis not present

## 2023-07-14 DIAGNOSIS — G629 Polyneuropathy, unspecified: Secondary | ICD-10-CM | POA: Diagnosis not present

## 2023-07-14 DIAGNOSIS — G4733 Obstructive sleep apnea (adult) (pediatric): Secondary | ICD-10-CM | POA: Diagnosis present

## 2023-07-14 DIAGNOSIS — R1319 Other dysphagia: Secondary | ICD-10-CM | POA: Diagnosis present

## 2023-07-14 DIAGNOSIS — Z96651 Presence of right artificial knee joint: Secondary | ICD-10-CM | POA: Diagnosis not present

## 2023-07-14 DIAGNOSIS — N1832 Chronic kidney disease, stage 3b: Secondary | ICD-10-CM | POA: Diagnosis not present

## 2023-07-14 DIAGNOSIS — R233 Spontaneous ecchymoses: Secondary | ICD-10-CM | POA: Diagnosis present

## 2023-07-14 DIAGNOSIS — K529 Noninfective gastroenteritis and colitis, unspecified: Secondary | ICD-10-CM | POA: Diagnosis not present

## 2023-07-14 DIAGNOSIS — G250 Essential tremor: Secondary | ICD-10-CM | POA: Insufficient documentation

## 2023-07-14 DIAGNOSIS — E871 Hypo-osmolality and hyponatremia: Secondary | ICD-10-CM | POA: Diagnosis not present

## 2023-07-14 DIAGNOSIS — G2581 Restless legs syndrome: Secondary | ICD-10-CM | POA: Insufficient documentation

## 2023-07-14 DIAGNOSIS — D649 Anemia, unspecified: Secondary | ICD-10-CM | POA: Diagnosis not present

## 2023-07-14 DIAGNOSIS — R0602 Shortness of breath: Secondary | ICD-10-CM | POA: Diagnosis not present

## 2023-07-14 DIAGNOSIS — E1165 Type 2 diabetes mellitus with hyperglycemia: Secondary | ICD-10-CM | POA: Diagnosis not present

## 2023-07-14 DIAGNOSIS — I2699 Other pulmonary embolism without acute cor pulmonale: Secondary | ICD-10-CM | POA: Diagnosis not present

## 2023-07-14 DIAGNOSIS — K5000 Crohn's disease of small intestine without complications: Secondary | ICD-10-CM | POA: Diagnosis present

## 2023-07-14 DIAGNOSIS — F419 Anxiety disorder, unspecified: Principal | ICD-10-CM

## 2023-07-14 DIAGNOSIS — Z79899 Other long term (current) drug therapy: Secondary | ICD-10-CM | POA: Diagnosis not present

## 2023-07-14 DIAGNOSIS — G3184 Mild cognitive impairment, so stated: Secondary | ICD-10-CM | POA: Diagnosis present

## 2023-07-14 DIAGNOSIS — I129 Hypertensive chronic kidney disease with stage 1 through stage 4 chronic kidney disease, or unspecified chronic kidney disease: Secondary | ICD-10-CM | POA: Diagnosis not present

## 2023-07-14 DIAGNOSIS — R413 Other amnesia: Secondary | ICD-10-CM | POA: Diagnosis present

## 2023-07-14 DIAGNOSIS — R739 Hyperglycemia, unspecified: Secondary | ICD-10-CM | POA: Insufficient documentation

## 2023-07-14 DIAGNOSIS — E440 Moderate protein-calorie malnutrition: Secondary | ICD-10-CM | POA: Insufficient documentation

## 2023-07-14 DIAGNOSIS — Z96641 Presence of right artificial hip joint: Secondary | ICD-10-CM | POA: Diagnosis not present

## 2023-07-14 DIAGNOSIS — K219 Gastro-esophageal reflux disease without esophagitis: Secondary | ICD-10-CM | POA: Diagnosis present

## 2023-07-14 DIAGNOSIS — H903 Sensorineural hearing loss, bilateral: Secondary | ICD-10-CM | POA: Diagnosis not present

## 2023-07-14 DIAGNOSIS — R0609 Other forms of dyspnea: Secondary | ICD-10-CM | POA: Diagnosis not present

## 2023-07-14 DIAGNOSIS — R1314 Dysphagia, pharyngoesophageal phase: Secondary | ICD-10-CM | POA: Diagnosis not present

## 2023-07-14 DIAGNOSIS — N289 Disorder of kidney and ureter, unspecified: Secondary | ICD-10-CM | POA: Insufficient documentation

## 2023-07-14 DIAGNOSIS — D509 Iron deficiency anemia, unspecified: Secondary | ICD-10-CM | POA: Diagnosis present

## 2023-07-14 HISTORY — DX: Chronic kidney disease, stage 3b: N18.32

## 2023-07-14 LAB — CBC WITH DIFFERENTIAL/PLATELET
Abs Immature Granulocytes: 0 10*3/uL (ref 0.00–0.07)
Basophils Absolute: 0 10*3/uL (ref 0.0–0.1)
Basophils Relative: 0 %
Eosinophils Absolute: 0.1 10*3/uL (ref 0.0–0.5)
Eosinophils Relative: 1 %
HCT: 27.6 % — ABNORMAL LOW (ref 36.0–46.0)
Hemoglobin: 9.8 g/dL — ABNORMAL LOW (ref 12.0–15.0)
Lymphocytes Relative: 20 %
Lymphs Abs: 1.2 10*3/uL (ref 0.7–4.0)
MCH: 34 pg (ref 26.0–34.0)
MCHC: 35.5 g/dL (ref 30.0–36.0)
MCV: 95.8 fL (ref 80.0–100.0)
Monocytes Absolute: 0.6 10*3/uL (ref 0.1–1.0)
Monocytes Relative: 10 %
Neutro Abs: 4 10*3/uL (ref 1.7–7.7)
Neutrophils Relative %: 69 %
Platelets: 180 10*3/uL (ref 150–400)
RBC: 2.88 MIL/uL — ABNORMAL LOW (ref 3.87–5.11)
RDW: 12.5 % (ref 11.5–15.5)
WBC: 5.8 10*3/uL (ref 4.0–10.5)
nRBC: 0 % (ref 0.0–0.2)

## 2023-07-14 LAB — COMPREHENSIVE METABOLIC PANEL
ALT: 17 U/L (ref 0–44)
AST: 20 U/L (ref 15–41)
Albumin: 3.6 g/dL (ref 3.5–5.0)
Alkaline Phosphatase: 69 U/L (ref 38–126)
Anion gap: 7 (ref 5–15)
BUN: 32 mg/dL — ABNORMAL HIGH (ref 8–23)
CO2: 17 mmol/L — ABNORMAL LOW (ref 22–32)
Calcium: 8.6 mg/dL — ABNORMAL LOW (ref 8.9–10.3)
Chloride: 110 mmol/L (ref 98–111)
Creatinine, Ser: 1.85 mg/dL — ABNORMAL HIGH (ref 0.44–1.00)
GFR, Estimated: 29 mL/min — ABNORMAL LOW (ref >60)
Glucose, Bld: 116 mg/dL — ABNORMAL HIGH (ref 70–99)
Potassium: 3.5 mmol/L (ref 3.5–5.1)
Sodium: 134 mmol/L — ABNORMAL LOW (ref 135–145)
Total Bilirubin: 0.6 mg/dL (ref 0.0–1.2)
Total Protein: 6.2 g/dL — ABNORMAL LOW (ref 6.5–8.1)

## 2023-07-14 LAB — I-STAT CHEM 8, ED
BUN: 36 mg/dL — ABNORMAL HIGH (ref 8–23)
Calcium, Ion: 1.21 mmol/L (ref 1.15–1.40)
Chloride: 109 mmol/L (ref 98–111)
Creatinine, Ser: 2 mg/dL — ABNORMAL HIGH (ref 0.44–1.00)
Glucose, Bld: 82 mg/dL (ref 70–99)
HCT: 30 % — ABNORMAL LOW (ref 36.0–46.0)
Hemoglobin: 10.2 g/dL — ABNORMAL LOW (ref 12.0–15.0)
Potassium: 4.3 mmol/L (ref 3.5–5.1)
Sodium: 140 mmol/L (ref 135–145)
TCO2: 20 mmol/L — ABNORMAL LOW (ref 22–32)

## 2023-07-14 LAB — HEPARIN LEVEL (UNFRACTIONATED): Heparin Unfractionated: 0.73 [IU]/mL — ABNORMAL HIGH (ref 0.30–0.70)

## 2023-07-14 LAB — BRAIN NATRIURETIC PEPTIDE: B Natriuretic Peptide: 20 pg/mL (ref 0.0–100.0)

## 2023-07-14 LAB — D-DIMER, QUANTITATIVE: D-Dimer, Quant: 1.18 ug{FEU}/mL — ABNORMAL HIGH (ref 0.00–0.50)

## 2023-07-14 LAB — TROPONIN I (HIGH SENSITIVITY): Troponin I (High Sensitivity): 4 ng/L (ref <18)

## 2023-07-14 MED ORDER — FENTANYL CITRATE PF 50 MCG/ML IJ SOSY: 12.5000 ug | PREFILLED_SYRINGE | INTRAMUSCULAR | Status: DC | PRN

## 2023-07-14 MED ORDER — HEPARIN (PORCINE) 25000 UT/250ML-% IV SOLN
1050.0000 [IU]/h | INTRAVENOUS | Status: AC
  Administered 2023-07-14: 1150 [IU]/h via INTRAVENOUS
  Filled 2023-07-14: qty 250

## 2023-07-14 MED ORDER — ACETAMINOPHEN 325 MG PO TABS
650.0000 mg | ORAL_TABLET | Freq: Four times a day (QID) | ORAL | Status: DC | PRN
  Administered 2023-07-14: 650 mg via ORAL
  Filled 2023-07-14: qty 2

## 2023-07-14 MED ORDER — HEPARIN BOLUS VIA INFUSION
3750.0000 [IU] | Freq: Once | INTRAVENOUS | Status: AC
  Administered 2023-07-14: 3750 [IU] via INTRAVENOUS

## 2023-07-14 MED ORDER — FERROUS SULFATE 325 (65 FE) MG PO TABS
325.0000 mg | ORAL_TABLET | Freq: Every day | ORAL | Status: DC
  Administered 2023-07-15 – 2023-07-16 (×2): 325 mg via ORAL
  Filled 2023-07-14 (×2): qty 1

## 2023-07-14 MED ORDER — PANTOPRAZOLE SODIUM 40 MG PO TBEC
40.0000 mg | DELAYED_RELEASE_TABLET | Freq: Two times a day (BID) | ORAL | Status: DC
  Administered 2023-07-14 – 2023-07-16 (×4): 40 mg via ORAL
  Filled 2023-07-14 (×4): qty 1

## 2023-07-14 MED ORDER — DICYCLOMINE HCL 10 MG PO CAPS
10.0000 mg | ORAL_CAPSULE | Freq: Three times a day (TID) | ORAL | Status: DC
  Administered 2023-07-14 – 2023-07-16 (×5): 10 mg via ORAL
  Filled 2023-07-14 (×5): qty 1

## 2023-07-14 MED ORDER — GABAPENTIN 300 MG PO CAPS
300.0000 mg | ORAL_CAPSULE | Freq: Every day | ORAL | Status: DC | PRN
Start: 2023-07-14 — End: 2023-07-16
  Administered 2023-07-15: 300 mg via ORAL
  Filled 2023-07-14: qty 1

## 2023-07-14 MED ORDER — METOPROLOL SUCCINATE ER 25 MG PO TB24
25.0000 mg | ORAL_TABLET | Freq: Every day | ORAL | Status: DC
Start: 2023-07-14 — End: 2023-07-16
  Administered 2023-07-14 – 2023-07-16 (×2): 25 mg via ORAL
  Filled 2023-07-14 (×3): qty 1

## 2023-07-14 MED ORDER — ROPINIROLE HCL 1 MG PO TABS
1.0000 mg | ORAL_TABLET | Freq: Every day | ORAL | Status: DC
  Administered 2023-07-14 – 2023-07-15 (×2): 1 mg via ORAL
  Filled 2023-07-14 (×2): qty 1

## 2023-07-14 MED ORDER — HYDROCODONE-ACETAMINOPHEN 5-325 MG PO TABS
1.0000 | ORAL_TABLET | Freq: Four times a day (QID) | ORAL | Status: DC | PRN
  Administered 2023-07-14 – 2023-07-16 (×4): 1 via ORAL
  Filled 2023-07-14 (×4): qty 1

## 2023-07-14 MED ORDER — LOPERAMIDE HCL 2 MG PO CAPS: 2.0000 mg | ORAL_CAPSULE | Freq: Two times a day (BID) | ORAL | Status: DC | PRN

## 2023-07-14 MED ORDER — ONDANSETRON HCL 4 MG/2ML IJ SOLN
4.0000 mg | Freq: Four times a day (QID) | INTRAMUSCULAR | Status: DC | PRN
  Administered 2023-07-15 – 2023-07-16 (×2): 4 mg via INTRAVENOUS
  Filled 2023-07-14 (×2): qty 2

## 2023-07-14 MED ORDER — LORAZEPAM 1 MG PO TABS
1.0000 mg | ORAL_TABLET | Freq: Once | ORAL | Status: AC
  Administered 2023-07-14: 1 mg via ORAL
  Filled 2023-07-14: qty 1

## 2023-07-14 MED ORDER — ONDANSETRON HCL 4 MG PO TABS: 4.0000 mg | ORAL_TABLET | Freq: Four times a day (QID) | ORAL | Status: DC | PRN

## 2023-07-14 MED ORDER — AMLODIPINE BESYLATE 5 MG PO TABS
5.0000 mg | ORAL_TABLET | Freq: Every day | ORAL | Status: DC
  Administered 2023-07-14 – 2023-07-16 (×2): 5 mg via ORAL
  Filled 2023-07-14 (×3): qty 1

## 2023-07-14 MED ORDER — SODIUM CHLORIDE 0.9 % IV SOLN: INTRAVENOUS | Status: AC

## 2023-07-14 MED ORDER — LACTATED RINGERS IV BOLUS
1000.0000 mL | Freq: Once | INTRAVENOUS | Status: AC
  Administered 2023-07-14: 1000 mL via INTRAVENOUS

## 2023-07-14 MED ORDER — ROSUVASTATIN CALCIUM 10 MG PO TABS
10.0000 mg | ORAL_TABLET | Freq: Every day | ORAL | Status: DC
  Administered 2023-07-14 – 2023-07-15 (×2): 10 mg via ORAL
  Filled 2023-07-14 (×2): qty 1

## 2023-07-14 MED ORDER — ACETAMINOPHEN 650 MG RE SUPP: 650.0000 mg | Freq: Four times a day (QID) | RECTAL | Status: DC | PRN

## 2023-07-14 MED ORDER — LEVOTHYROXINE SODIUM 100 MCG PO TABS
100.0000 ug | ORAL_TABLET | Freq: Every day | ORAL | Status: DC
  Administered 2023-07-15 – 2023-07-16 (×2): 100 ug via ORAL
  Filled 2023-07-14 (×2): qty 1

## 2023-07-14 MED ORDER — TECHNETIUM TO 99M ALBUMIN AGGREGATED
4.4000 | Freq: Once | INTRAVENOUS | Status: AC | PRN
Start: 2023-07-14 — End: 2023-07-14
  Administered 2023-07-14: 4.4 via INTRAVENOUS

## 2023-07-14 MED ORDER — BUPROPION HCL ER (XL) 150 MG PO TB24
150.0000 mg | ORAL_TABLET | Freq: Every morning | ORAL | Status: DC
  Administered 2023-07-15 – 2023-07-16 (×2): 150 mg via ORAL
  Filled 2023-07-14 (×2): qty 1

## 2023-07-14 NOTE — Hospital Course (Signed)
 73 year old female with Crohn's disease who started having recent flares and was treated with Entyvio  injections which she barely tolerated and was associated with subsequent recurrent infections requiring antibiotics, iron deficiency anemia, easy bruising, generalized anxiety disorder, stage IIIb CKD, hypothyroidism, neuropathy, GERD, chronic diarrhea, OSA, mild cognitive impairment, essential tremor, esophageal dysphagia (scheduled for EGD in 4 days) reports that she has been suffering from shortness of breath especially with exertion, exercise intolerance for past several months.  She sought medical care and was seen in ED and outpatient for several times and it was thought that she had pneumonia and she was treated with steroids and antibiotics but the dyspnea never resolved after she completed the treatment.  She also was told that she was having an anxiety attack and treated with lorazepam .    Today she presented to the ED with severe anxiety and shortness of breath symptoms.  She had been unable to sleep. She denied fever and chills. 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  She is being admitted for anticoagulation and further work up.

## 2023-07-14 NOTE — ED Provider Notes (Signed)
 [place] EMERGENCY DEPARTMENT AT [facility] Provider Note   CSN: [id number] Arrival date & time: 07/14/23  9478     History  Chief Complaint  Patient presents with   Anxiety    Jessica [name] is a 73 y.o. female.  The history is provided by the patient.  She has a history of hypertension, Crohn's disease and feels that she is having side effects of vedolizumab .  She states that she is having extreme anxiety, especially at night.  There has been some shortness of breath but no chest pain.  She denies fever but has had chills and sweats.  She also states that she had been treated for pneumonia and is now being treated for sinus infection.  She does endorse nausea but no vomiting and denies diarrhea.  She is concerned because she is a fall risk and she keeps getting up because of her anxiety.   Home Medications Prior to Admission medications   Medication Sig Start Date End Date Taking? Authorizing Provider  amLODipine  (NORVASC ) 10 MG tablet Take 0.5 tablets (5 mg total) by mouth daily. 05/30/23   Miriam Norris, NP  buPROPion  (WELLBUTRIN  XL) 150 MG 24 hr tablet TAKE 1 TABLET BY MOUTH ONCE DAILY IN THE MORNING 06/16/23   Okey Barnie SAUNDERS, MD  dicyclomine  (BENTYL ) 10 MG capsule TAKE 1 CAPSULE BY MOUTH TWICE DAILY BEFORE A MEAL 07/11/23   Kennedy Charmaine CROME, NP  ferrous sulfate  325 (65 FE) MG EC tablet Take 1 tablet (325 mg total) by mouth daily with breakfast. 03/13/23   Geofm Delon BRAVO, NP  gabapentin  (NEURONTIN ) 300 MG capsule TAKE 1 CAPSULE BY MOUTH THREE TIMES DAILY 03/03/23   Hyatt, Max T, DPM  HYDROcodone -acetaminophen  (NORCO/VICODIN) 5-325 MG tablet Take 1 tablet by mouth every 6 (six) hours as needed for up to 20 doses for moderate pain (pain score 4-6). 06/26/23   Lamount Ethan CROME, DPM  levofloxacin (LEVAQUIN) 500 MG tablet Take 500 mg by mouth daily. 07/07/23   [provider]  levothyroxine  (SYNTHROID ) 100 MCG tablet Take 100 mcg by mouth daily. 01/09/22   [provider]  loperamide  (IMODIUM ) 2 MG capsule TAKE 1 CAPSULE BY MOUTH TWICE DAILY AS NEEDED FOR LOOSE STOOLS 12/26/22   Kennedy Charmaine CROME, NP  metoprolol  succinate (TOPROL -XL) 25 MG 24 hr tablet TAKE 1 & 1/2 (ONE & ONE-HALF) TABLETS BY MOUTH ONCE DAILY 01/06/23   Alvan Dorn FALCON, MD  Multiple Vitamin (MULTIVITAMIN WITH MINERALS) TABS tablet Take 1 tablet by mouth daily.    [provider]  ondansetron  (ZOFRAN -ODT) 4 MG disintegrating tablet Take 1 tablet (4 mg total) by mouth every 8 (eight) hours as needed. 02/27/23   Horton, Charmaine FALCON, MD  pantoprazole  (PROTONIX ) 40 MG tablet Take 1 tablet (40 mg total) by mouth 2 (two) times daily. 04/01/23   Eartha Angelia Sieving, MD  potassium chloride  (KLOR-CON ) 10 MEQ tablet Take 10 mEq by mouth daily. 04/26/21 06/19/23  [provider]  predniSONE  (STERAPRED UNI-PAK 21 TAB) 5 MG (21) TBPK tablet Take 5 mg by mouth as directed. 07/07/23   [provider]  rOPINIRole  (REQUIP ) 1 MG tablet Take 1 mg by mouth at bedtime. 03/18/23   [provider]  rosuvastatin  (CRESTOR ) 10 MG tablet Take 1 tablet (10 mg total) by mouth daily. 01/10/23   Miriam Norris, NP  spironolactone (ALDACTONE) 50 MG tablet Take 50 mg by mouth daily. 03/27/22   [provider]      Allergies  Morphine and codeine, Robaxin  [methocarbamol ], and Sulfa antibiotics    Review of Systems   Review of Systems  All other systems reviewed and are negative.   Physical Exam Updated Vital Signs BP 91/65   Pulse 70   Temp 98.1 F (36.7 C)   Resp 16   Ht 5' 2 (1.575 m)   Wt 68.5 kg   SpO2 100%   BMI 27.62 kg/m  Physical Exam Vitals and nursing note reviewed.   73 year old female, resting comfortably and in no acute distress. Vital signs are significant for borderline low blood pressure. Oxygen saturation is 100%, which is normal. Head is normocephalic and atraumatic. PERRLA, EOMI. Oropharynx is clear. Neck is nontender and supple  without adenopathy. Lungs are clear without rales, wheezes, or rhonchi. Chest is nontender. Heart has regular rate and rhythm without murmur. Abdomen is soft, flat, nontender. Extremities have no cyanosis or edema, full range of motion is present. Skin is warm and dry without rash. Neurologic: Mental status is normal, moves all extremities equally.  ED Results / Procedures / Treatments   Labs (all labs ordered are listed, but only abnormal results are displayed) Labs Reviewed  COMPREHENSIVE METABOLIC PANEL - Abnormal; Notable for the following components:      Result Value   Sodium 134 (*)    CO2 17 (*)    Glucose, Bld 116 (*)    BUN 32 (*)    Creatinine, Ser 1.85 (*)    Calcium  8.6 (*)    Total Protein 6.2 (*)    GFR, Estimated 29 (*)    All other components within normal limits  CBC WITH DIFFERENTIAL/PLATELET - Abnormal; Notable for the following components:   RBC 2.88 (*)    Hemoglobin 9.8 (*)    HCT 27.6 (*)    All other components within normal limits  D-DIMER, QUANTITATIVE - Abnormal; Notable for the following components:   D-Dimer, Quant 1.18 (*)    All other components within normal limits    EKG EKG Interpretation Date/Time:  Monday July 14 2023 05:36:22 EST Ventricular Rate:  71 PR Interval:  196 QRS Duration:  96 QT Interval:  382 QTC Calculation: 416 R Axis:   58  Text Interpretation: Sinus rhythm Low voltage, extremity and precordial leads When compared with ECG of 03/18/2023, No significant change was found Confirmed by Raford Lenis ([id number]) on 07/14/2023 5:45:23 AM  Radiology DG Chest Port 1 View Result Date: 07/14/2023 CLINICAL DATA:  Shortness of breath. EXAM: PORTABLE CHEST 1 VIEW COMPARISON:  06/19/2023.  11/15/2020. FINDINGS: The lungs are clear without focal pneumonia, edema, pneumothorax or pleural effusion. Tiny granuloma lateral left lower lung stable since the 2022 exam. The cardio pericardial silhouette is enlarged. No acute bony abnormality.  Telemetry leads overlie the chest. IMPRESSION: Enlargement of the cardiopericardial silhouette without acute cardiopulmonary findings. Electronically Signed   By: Camellia Candle M.D.   On: 07/14/2023 06:11    Procedures Procedures    Medications Ordered in ED Medications  LORazepam  (ATIVAN ) tablet 1 mg (1 mg Oral Given 07/14/23 9393)    ED Course/ Medical Decision Making/ A&P                                 Medical Decision Making Amount and/or Complexity of Data Reviewed Labs: ordered. Radiology: ordered.  Risk Prescription drug management.   Anxiety in the setting of vedolizumab  treatment for Crohn's disease.  I have  reviewed her electrocardiogram, and my interpretation is low voltage and otherwise normal ECG and unchanged from prior.  I have ordered laboratory workup with CBC and comprehensive metabolic panel.  I have also ordered D-dimer as vedolizumab  is associated with abnormal blood clotting.  Chest x-ray shows no evidence of pneumonia.  Have independently viewed the image, and agree with radiologist's interpretation.  I have given her a dose of lorazepam  for her anxiety.  I have reviewed her laboratory tests, and my interpretation is mild hyponatremia which is not felt to be clinically significant, stable metabolic acidosis, elevated random glucose level which will need to be followed as an outpatient, stable renal insufficiency, anemia which is slightly worse than recent but within the range that she has been at in the past.  D-dimer is elevated, I have ordered CT angiogram of the chest to rule out pulmonary embolism.  Case is signed out to Dr. Albertina.  Of note, she feels much better following lorazepam .  If CT angiogram is negative, anticipate discharge with prescription for lorazepam  as needed.  Final Clinical Impression(s) / ED Diagnoses Final diagnoses:  Anxiety  Renal insufficiency  Normochromic normocytic anemia  Hyponatremia  Elevated random blood glucose level     Rx / DC Orders ED Discharge Orders     None         Raford Lenis, MD 07/14/23 [phone]

## 2023-07-14 NOTE — Progress Notes (Signed)
 PHARMACY - ANTICOAGULATION CONSULT NOTE  Pharmacy Consult for Heparin  Indication: pulmonary embolus  Allergies  Allergen Reactions   Morphine And Codeine Hives and Itching    May cause blood pressure to drop   Robaxin  [Methocarbamol ] Itching   Sulfa Antibiotics Itching    ALL SULFA DRUGS    Patient Measurements: Height: 5' 2 (157.5 cm) Weight: 68.5 kg (151 lb) IBW/kg (Calculated) : 50.1 HEPARIN  DW (KG): 64.4   Vital Signs: Temp: 98.1 F (36.7 C) (01/06 0530) BP: 119/65 (01/06 1015) Pulse Rate: 72 (01/06 1015)  Labs: Recent Labs    07/14/23 0615 07/14/23 0619 07/14/23 0914  HGB 9.8*  --  10.2*  HCT 27.6*  --  30.0*  PLT 180  --   --   CREATININE 1.85*  --  2.00*  TROPONINIHS  --  4  --     Estimated Creatinine Clearance: 23.8 mL/min (A) (by C-G formula based on SCr of 2 mg/dL (H)).   Medical History: Past Medical History:  Diagnosis Date   Anxiety attack    Arthritis    Back pain, chronic    Chronic kidney disease    Crohn's colitis (HCC)    Depression (emotion)    Dysrhythmia    GERD (gastroesophageal reflux disease)    Hypertension    Hypothyroid    Iron deficiency anemia, unspecified    Neuropathy    Sleep apnea    cpap is not working per patient - needs to find a new doctor - not used since 7/16 per pat    Stage 3 chronic kidney disease (HCC)    Vertigo    followed by Dr Kievie- in [place]     Medications:  See med rec  Assessment: Patient presented to ED with some SOB. Exhibiting anxiety in the setting of vedolizumab  treatment for Crohn's disease that her issues maybe related to the injection. Chest x-ray shows no evidence of pneumonia. Some SOB,d-Dimer elevated=> unable to do CTA, plan for VQ scan. Not on any oral anticoagulants. Pharmacy asked to start heparin   Goal of Therapy:  Heparin  level 0.3-0.7 units/ml Monitor platelets by anticoagulation protocol: Yes   Plan:  Give 3750 units bolus x 1 Start heparin  infusion at 1150  units/hr Check anti-Xa level in ~8 hours and daily while on heparin  Continue to monitor H&H and platelets  Drequan Ironside, BS Pharm D, BCPS Clinical Pharmacist 07/14/2023,1:10 PM

## 2023-07-14 NOTE — ED Notes (Signed)
 Pt will be going to [address] to [place], but she is still getting change of shift report. I'll try to call her back in a few minutes.

## 2023-07-14 NOTE — Progress Notes (Signed)
 PHARMACY - ANTICOAGULATION CONSULT NOTE  Pharmacy Consult for Heparin  Indication: pulmonary embolus  Allergies  Allergen Reactions   Morphine And Codeine Hives and Itching    May cause blood pressure to drop   Robaxin  [Methocarbamol ] Itching   Sulfa Antibiotics Itching    ALL SULFA DRUGS    Patient Measurements: Height: 5' 2 (157.5 cm) Weight: 68.5 kg (151 lb) IBW/kg (Calculated) : 50.1 HEPARIN  DW (KG): 64.4   Vital Signs: Temp: 97.7 F (36.5 C) (01/06 2033) Temp Source: Oral (01/06 2033) BP: 105/59 (01/06 2033) Pulse Rate: 67 (01/06 2033)  Labs: Recent Labs    07/14/23 0615 07/14/23 0619 07/14/23 0914 07/14/23 2133  HGB 9.8*  --  10.2*  --   HCT 27.6*  --  30.0*  --   PLT 180  --   --   --   HEPARINUNFRC  --   --   --  0.73*  CREATININE 1.85*  --  2.00*  --   TROPONINIHS  --  4  --   --     Estimated Creatinine Clearance: 23.8 mL/min (A) (by C-G formula based on SCr of 2 mg/dL (H)).  Assessment: Patient presented to ED with some SOB. Exhibiting anxiety in the setting of vedolizumab  treatment for Crohn's disease that her issues maybe related to the injection. Chest x-ray shows no evidence of pneumonia. Some SOB,d-Dimer elevated=> unable to do CTA, plan for VQ scan. Not on any oral anticoagulants. Pharmacy asked to start heparin   1/6 PM:heparin  level returned at 0.73 on 1150 units/hr. Per RN, no issues with the heparin  infusion running continuously or signs/symptoms of bleeding   Goal of Therapy:  Heparin  level 0.3-0.7 units/ml Monitor platelets by anticoagulation protocol: Yes   Plan:  Decrease heparin  infusion to 1050 units/hr Check anti-Xa level in ~8 hours and daily while on heparin  Continue to monitor H&H and platelets  Lynwood Poplar, PharmD. Clinical Pharmacist 07/14/2023 10:24 PM

## 2023-07-14 NOTE — ED Triage Notes (Signed)
 Pt to ED cc inability to sleep due to anxiety and shortness of breath that has been ongoing for months. Denies chest pain. States she believes its from starting a Entivyo to treat her Chrons. Has been evaluated for same in November.

## 2023-07-14 NOTE — H&P (Signed)
 History and Physical  [facility]  Jessica [name] FMW:[id number] DOB: 03-11-1951 DOA: 07/14/2023  PCP: [name], MD  Patient coming from: Home  Level of care: Telemetry  I have personally briefly reviewed patient's old medical records in [facility]  Chief Complaint: shortness of breath   HPI: Jessica [name] is a 73 year old female with Crohn's disease who started having recent flares and was treated with Entyvio  injections which she barely tolerated and was associated with subsequent recurrent infections requiring antibiotics, iron deficiency anemia, easy bruising, generalized anxiety disorder, stage IIIb CKD, hypothyroidism, neuropathy, GERD, chronic diarrhea, OSA, mild cognitive impairment, essential tremor, esophageal dysphagia (scheduled for EGD in 4 days) reports that she has been suffering from shortness of breath especially with exertion, exercise intolerance for past several months.  She sought medical care and was seen in ED and outpatient for several times and it was thought that she had pneumonia and she was treated with steroids and antibiotics but the dyspnea never resolved after she completed the treatment.  She also was told that she was having an anxiety attack and treated with lorazepam .    Today she presented to the ED with severe anxiety and shortness of breath symptoms.  She had been unable to sleep. She denied fever and chills. 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  She is being admitted for anticoagulation and further work up.     Past Medical History:  Diagnosis Date   Anxiety attack    Arthritis    Back pain, chronic    Chronic kidney disease    Crohn's colitis (HCC)    Depression (emotion)    Dysrhythmia    GERD (gastroesophageal reflux disease)    Hypertension    Hypothyroid    Iron deficiency anemia, unspecified    Neuropathy    Sleep apnea    cpap is not  working per patient - needs to find a new doctor - not used since 7/16 per pat    Stage 3 chronic kidney disease (HCC)    Vertigo    followed by Dr Kievie- in [place]     Past Surgical History:  Procedure Laterality Date   ABDOMINAL HYSTERECTOMY     BIOPSY  06/08/2018   Procedure: BIOPSY;  Surgeon: Golda Claudis PENNER, MD;  [facility];  Service: Endoscopy;;  (colon)   BIOPSY  04/01/2023   Procedure: BIOPSY;  Surgeon: Eartha Flavors, Toribio, MD;  [facility];  Service: Gastroenterology;;   CHOLECYSTECTOMY     COLON SURGERY     for Crohn's in the 80s in [place]    COLONOSCOPY N/A 03/11/2013   Procedure: COLONOSCOPY;  Surgeon: Claudis PENNER Golda, MD;  [facility];  Service: Endoscopy;  Laterality: N/A;  1200   COLONOSCOPY N/A 06/08/2018   Procedure: COLONOSCOPY;  Surgeon: Golda Claudis PENNER, MD;  [facility];  Service: Endoscopy;  Laterality: N/A;  8:25   COLONOSCOPY WITH PROPOFOL  N/A 04/01/2023   Procedure: COLONOSCOPY WITH PROPOFOL ;  Surgeon: Eartha Flavors Toribio, MD;  [facility];  Service: Gastroenterology;  Laterality: N/A;   ESOPHAGEAL BRUSHING  04/01/2023   Procedure: ESOPHAGEAL BRUSHING;  Surgeon: Eartha Flavors Toribio, MD;  [facility];  Service: Gastroenterology;;   ESOPHAGOGASTRODUODENOSCOPY (EGD) WITH PROPOFOL  N/A 04/01/2023   Procedure: ESOPHAGOGASTRODUODENOSCOPY (EGD) WITH PROPOFOL ;  Surgeon: Eartha Flavors Toribio, MD;  [facility];  Service: Gastroenterology;  Laterality: N/A;  1:00am;asa 3   fatty tumor removed     HARDWARE REMOVAL Right 08/07/2016   Procedure: HARDWARE REMOVAL;  Surgeon: Dempsey Moan, MD;  Location: [facility];  Service: Orthopedics;  Laterality: Right;   Hip replacement      Rt hip in 2010 in [place]   KNEE ARTHROSCOPY Left 03/29/2015   Procedure: ARTHROSCOPY LEFT KNEE WITH MENSICAL DEBRIDEMENT, chondroplasty;  Surgeon: Dempsey Moan, MD;  Location: [facility];  Service:  Orthopedics;  Laterality: Left;   ORIF TIBIA PLATEAU Right 02/29/2016   Procedure: OPEN REDUCTION INTERNAL FIXATION (ORIF RIGHT  TIBIAL PLATEAU FRACTURE;  Surgeon: Dempsey Moan, MD;  [facility]: [facility];  Service: Orthopedics;  Laterality: Right;   POLYPECTOMY  04/01/2023   Procedure: POLYPECTOMY;  Surgeon: Eartha Angelia Sieving, MD;  [facility];  Service: Gastroenterology;;   TEE WITHOUT CARDIOVERSION N/A 03/27/2021   Procedure: TRANSESOPHAGEAL ECHOCARDIOGRAM (TEE);  Surgeon: Alvan Dorn FALCON, MD;  [facility];  Service: Endoscopy;  Laterality: N/A;   TOTAL KNEE ARTHROPLASTY Right 08/25/2017   Procedure: TOTAL RIGHT KNEE ARTHROPLASTY;  Surgeon: Moan Dempsey, MD;  [facility]: [facility];  Service: Orthopedics;  Laterality: Right;     reports that she has never smoked. She has never used smokeless tobacco. She reports that she does not drink alcohol  and does not use drugs.  Allergies  Allergen Reactions   Morphine And Codeine Hives and Itching    May cause blood pressure to drop   Robaxin  [Methocarbamol ] Itching   Sulfa Antibiotics Itching    ALL SULFA DRUGS    Family History  Problem Relation Age of Onset   Crohn's disease Brother    Depression Mother    Anxiety disorder Mother    Depression Sister    Aneurysm Father    Colon cancer Neg Hx     Prior to Admission medications   Medication Sig Start Date End Date Taking? Authorizing Provider  amLODipine  (NORVASC ) 10 MG tablet Take 0.5 tablets (5 mg total) by mouth daily. 05/30/23  Yes Miriam Norris, NP  buPROPion  (WELLBUTRIN  XL) 150 MG 24 hr tablet TAKE 1 TABLET BY MOUTH ONCE DAILY IN THE MORNING 06/16/23  Yes Okey Barnie SAUNDERS, MD  dicyclomine  (BENTYL ) 10 MG capsule TAKE 1 CAPSULE BY MOUTH TWICE DAILY BEFORE A MEAL 07/11/23  Yes Kennedy Charmaine CROME, NP  ferrous sulfate  325 (65 FE) MG EC tablet Take 1 tablet (325 mg total) by mouth daily with breakfast. 03/13/23  Yes Burns, Delon BRAVO, NP  gabapentin  (NEURONTIN ) 300  MG capsule TAKE 1 CAPSULE BY MOUTH THREE TIMES DAILY 03/03/23  Yes Hyatt, Max T, DPM  HYDROcodone -acetaminophen  (NORCO/VICODIN) 5-325 MG tablet Take 1 tablet by mouth every 6 (six) hours as needed for up to 20 doses for moderate pain (pain score 4-6). 06/26/23  Yes Semon, Jake L, DPM  levothyroxine  (SYNTHROID ) 100 MCG tablet Take 100 mcg by mouth daily. 01/09/22  Yes [provider]  loperamide  (IMODIUM ) 2 MG capsule TAKE 1 CAPSULE BY MOUTH TWICE DAILY AS NEEDED FOR LOOSE STOOLS 12/26/22  Yes Mahon, Courtney L, NP  metoprolol  succinate (TOPROL -XL) 25 MG 24 hr tablet TAKE 1 & 1/2 (ONE & ONE-HALF) TABLETS BY MOUTH ONCE DAILY Patient taking differently: Take 25 mg by mouth daily. TAKE 1 & 1/2 (ONE & ONE-HALF) TABLETS BY MOUTH ONCE DAILY 01/06/23  Yes Branch, Dorn FALCON, MD  Multiple Vitamin (MULTIVITAMIN WITH MINERALS) TABS tablet Take 1 tablet by mouth daily.  Yes [provider]  pantoprazole  (PROTONIX ) 40 MG tablet Take 1 tablet (40 mg total) by mouth 2 (two) times daily. 04/01/23  Yes Eartha Angelia Sieving, MD  rOPINIRole  (REQUIP ) 1 MG tablet Take 1 mg by mouth at bedtime. 03/18/23  Yes [provider]  rosuvastatin  (CRESTOR ) 10 MG tablet Take 1 tablet (10 mg total) by mouth daily. 01/10/23  Yes Miriam Norris, NP  spironolactone (ALDACTONE) 50 MG tablet Take 50 mg by mouth daily. 03/27/22  Yes [provider]  potassium chloride  (KLOR-CON ) 10 MEQ tablet Take 10 mEq by mouth daily. Patient not taking: Reported on 07/14/2023 04/26/21 06/19/23  [provider]   Physical Exam: Vitals:   07/14/23 0615 07/14/23 0645 07/14/23 0845 07/14/23 1015  BP: 112/60 (!) 107/56 (!) 98/48 119/65  Pulse: 70 66 70 72  Resp: 16 13 17 15   Temp:      SpO2: 100% 97% 99% 94%  Weight:      Height:       Constitutional: NAD, calm, comfortable Eyes: PERRL, lids and conjunctivae normal ENMT: Mucous membranes are moist. Posterior pharynx clear of any exudate or lesions.  Neck:  normal, supple, no masses, no thyromegaly Respiratory: clear to auscultation bilaterally, no wheezing, no crackles. Normal respiratory effort. No accessory muscle use.  Cardiovascular: normal s1, s2 sounds, no murmurs / rubs / gallops. No extremity edema. 2+ pedal pulses. No carotid bruits.  Abdomen: no tenderness, no masses palpated. No hepatosplenomegaly. Bowel sounds positive.  Musculoskeletal: no clubbing / cyanosis. No joint deformity upper and lower extremities. Good ROM, no contractures. Normal muscle tone.  Skin: thin skin with multiple areas of bruising on legs seen.  Neurologic: CN 2-12 grossly intact. Sensation intact, DTR normal. Strength 5/5 in all 4.  Psychiatric: Normal judgment and insight. Alert and oriented x 3. Normal mood.   Labs on Admission: I have personally reviewed following labs and imaging studies  CBC: Recent Labs  Lab 07/08/23 1014 07/14/23 0615 07/14/23 0914  WBC 5.0 5.8  --   NEUTROABS 4.2 4.0  --   HGB 11.0* 9.8* 10.2*  HCT 31.9* 27.6* 30.0*  MCV 95.8 95.8  --   PLT 154 180  --    Basic Metabolic Panel: Recent Labs  Lab 07/08/23 1014 07/14/23 0615 07/14/23 0914  NA 137 134* 140  K 4.2 3.5 4.3  CL 110 110 109  CO2 17* 17*  --   GLUCOSE 141* 116* 82  BUN 24* 32* 36*  CREATININE 1.79* 1.85* 2.00*  CALCIUM  9.6 8.6*  --    GFR: Estimated Creatinine Clearance: 23.8 mL/min (A) (by C-G formula based on SCr of 2 mg/dL (H)). Liver Function Tests: Recent Labs  Lab 07/08/23 1014 07/14/23 0615  AST 26 20  ALT 20 17  ALKPHOS 76 69  BILITOT 1.0 0.6  PROT 7.2 6.2*  ALBUMIN  3.7 3.6   No results for input(s): LIPASE, AMYLASE in the last 168 hours. No results for input(s): AMMONIA in the last 168 hours. Coagulation Profile: No results for input(s): INR, PROTIME in the last 168 hours. Cardiac Enzymes: No results for input(s): CKTOTAL, CKMB, CKMBINDEX, TROPONINI in the last 168 hours. BNP (last 3 results) No results for  input(s): PROBNP in the last 8760 hours. HbA1C: No results for input(s): HGBA1C in the last 72 hours. CBG: No results for input(s): GLUCAP in the last 168 hours. Lipid Profile: No results for input(s): CHOL, HDL, LDLCALC, TRIG, CHOLHDL, LDLDIRECT in the last 72 hours. Thyroid  Function Tests: No  results for input(s): TSH, T4TOTAL, FREET4, T3FREE, THYROIDAB in the last 72 hours. Anemia Panel: No results for input(s): VITAMINB12, FOLATE, FERRITIN, TIBC, IRON, RETICCTPCT in the last 72 hours. Urine analysis:    Component Value Date/Time   COLORURINE YELLOW 06/05/2023 1404   APPEARANCEUR HAZY (A) 06/05/2023 1404   LABSPEC 1.008 06/05/2023 1404   PHURINE 5.0 06/05/2023 1404   GLUCOSEU NEGATIVE 06/05/2023 1404   HGBUR NEGATIVE 06/05/2023 1404   BILIRUBINUR NEGATIVE 06/05/2023 1404   KETONESUR NEGATIVE 06/05/2023 1404   PROTEINUR NEGATIVE 06/05/2023 1404   UROBILINOGEN 0.2 06/19/2014 0452   NITRITE NEGATIVE 06/05/2023 1404   LEUKOCYTESUR MODERATE (A) 06/05/2023 1404    Radiological Exams on Admission: NM Pulmonary Perfusion Result Date: 07/14/2023 CLINICAL DATA:  Several month history of shortness of breath EXAM: NUCLEAR MEDICINE PERFUSION LUNG SCAN TECHNIQUE: Perfusion images were obtained in multiple projections after intravenous injection of radiopharmaceutical. Ventilation scans intentionally deferred if perfusion scan and chest x-ray adequate for interpretation during COVID 19 epidemic. RADIOPHARMACEUTICALS:  4.4 mCi Tc-53m MAA IV COMPARISON:  Same day chest radiograph FINDINGS: Peripheral, wedge-shaped perfusion defect along the anterior right middle lobe. IMPRESSION: Pulmonary embolism present. Critical Value/emergent results were called by telephone at the time of interpretation on 07/14/2023 at 12:59 pm to provider [facility] , who verbally acknowledged these results. Electronically Signed   By: Limin  Xu M.D.   On: 07/14/2023 12:59   DG Chest  Port 1 View Result Date: 07/14/2023 CLINICAL DATA:  Shortness of breath. EXAM: PORTABLE CHEST 1 VIEW COMPARISON:  06/19/2023.  11/15/2020. FINDINGS: The lungs are clear without focal pneumonia, edema, pneumothorax or pleural effusion. Tiny granuloma lateral left lower lung stable since the 2022 exam. The cardio pericardial silhouette is enlarged. No acute bony abnormality. Telemetry leads overlie the chest. IMPRESSION: Enlargement of the cardiopericardial silhouette without acute cardiopulmonary findings. Electronically Signed   By: Camellia Candle M.D.   On: 07/14/2023 06:11   EKG: Independently reviewed. NSR   Assessment/Plan Principal Problem:   Pulmonary embolism (HCC) Active Problems:   Hypothyroidism   Neuropathy   GERD (gastroesophageal reflux disease)   Chronic diarrhea   Crohn's disease of small intestine (HCC)   Dyspnea on exertion   OSA (obstructive sleep apnea)   Sensorineural hearing loss (SNHL), bilateral   Memory loss   Mild cognitive impairment   Essential tremor   Restless leg syndrome   Easy bruising   IDA (iron deficiency anemia)   Esophageal dysphagia   Stage 3b chronic kidney disease (CKD) (HCC)   Pulmonary Embolism  - not sure how long this has been present as she reports having symptoms for months - pt started on IV heparin  infusion dosed by pharm D - check 2D echocardiogram to evaluate for right heart strain  - supplemental oxygen and supportive measures ordered - transition to DOAC prior to discharge, request benefits check from pharmacy  IDA - followed by hematology - receiving iron infusion therapies - follow CBC  Crohn's Disease - followed by [facility]  - pt says she did not tolerate entyvio  infusions - resumed home antidiarrheals - resumed home GI medications   RLS - resumed home requip  at bedtime  OSA - will offer CPAP therapy in hospital with auto-titrate feature  Stage 3b CKD - appears dehydrated today, gentle hydration and recheck  BMP in AM  Esophageal dysphagia - dysphagia 3 diet  - EGD planned for Friday (outpatient) - asked patient to notify GI regarding PE diagnosis  - may need to reschedule -  resumed home pantoprazole    Hypothyroidism  - resume home levothyroxine  daily   Neuropathy - gabapentin  ordered PRN only  DVT prophylaxis: IV heparin  infusion   Code Status: Full   Family Communication: husband at bedside   Disposition Plan: anticipate home   Consults called: pharm D   Admission status: INP   Level of care: Telemetry Afton Louder MD [facility] How to contact the [facility] Attending or Consulting provider 7A - 7P or covering provider during after hours 7P -7A, for this patient?  Check the care team in [facility] and look for a) attending/consulting [facility] provider listed and b) the [facility] team listed Log into [email or web address] and use [facility] universal password to access. If you do not have the password, please contact the hospital operator. Locate the [facility] provider you are looking for under [facility] and page to a number that you can be directly reached. If you still have difficulty reaching the provider, please page the [facility] (Director on Call) for the Hospitalists listed on amion for assistance.   If 7PM-7AM, please contact night-coverage [email or web address] Password [email or web address]  07/14/2023, 1:46 PM

## 2023-07-14 NOTE — ED Provider Notes (Signed)
  Physical Exam  BP (!) 107/56 (BP Location: Right Arm)   Pulse 70   Temp 98.1 F (36.7 C) (Oral)   Resp 15   Ht 5' 2 (1.575 m)   Wt 68.5 kg   SpO2 97%   BMI 27.62 kg/m   Physical Exam Vitals and nursing note reviewed.  Constitutional:      General: She is not in acute distress.    Appearance: She is well-developed.  HENT:     Head: Normocephalic and atraumatic.  Eyes:     Conjunctiva/sclera: Conjunctivae normal.  Cardiovascular:     Rate and Rhythm: Normal rate and regular rhythm.     Heart sounds: No murmur heard. Pulmonary:     Effort: Pulmonary effort is normal. No respiratory distress.     Breath sounds: Normal breath sounds.  Abdominal:     Palpations: Abdomen is soft.     Tenderness: There is no abdominal tenderness.  Musculoskeletal:        General: No swelling.     Cervical back: Neck supple.  Skin:    General: Skin is warm and dry.     Capillary Refill: Capillary refill takes less than 2 seconds.  Neurological:     Mental Status: She is alert.  Psychiatric:        Mood and Affect: Mood normal.     Procedures  .Critical Care  Performed by: Albertina Dixon, MD Authorized by: Albertina Dixon, MD   Critical care provider statement:    Critical care time (minutes):  30   Critical care was necessary to treat or prevent imminent or life-threatening deterioration of the following conditions:  Cardiac failure (Pulmonary emboli requiring heparin  drip)   Critical care was time spent personally by me on the following activities:  Development of treatment plan with patient or surrogate, discussions with consultants, evaluation of patient's response to treatment, examination of patient, ordering and review of laboratory studies, ordering and review of radiographic studies, ordering and performing treatments and interventions, pulse oximetry, re-evaluation of patient's condition and review of old charts   ED Course / MDM    Medical Decision Making Amount and/or  Complexity of Data Reviewed Labs: ordered. Radiology: ordered.  Risk Prescription drug management. Decision regarding hospitalization.   Patient received in handoff.  Orthopnea and nighttime anxiety pending PE study.  Patient's GFR is less than 30 and unable to obtain CT PE.  Attempted fluid resuscitation and recheck of GFR but patient unfortunately unable to have the study performed.  A VQ scan was performed that is positive for pulmonary emboli.  Patient started on heparin  and patient admitted.       Albertina Dixon, MD 07/14/23 [phone]

## 2023-07-15 ENCOUNTER — Other Ambulatory Visit (HOSPITAL_COMMUNITY): Payer: Self-pay

## 2023-07-15 ENCOUNTER — Inpatient Hospital Stay (HOSPITAL_COMMUNITY): Payer: Medicare Other

## 2023-07-15 ENCOUNTER — Encounter: Payer: Self-pay | Admitting: Gastroenterology

## 2023-07-15 ENCOUNTER — Telehealth (HOSPITAL_COMMUNITY): Payer: Self-pay | Admitting: Pharmacy Technician

## 2023-07-15 ENCOUNTER — Inpatient Hospital Stay (HOSPITAL_BASED_OUTPATIENT_CLINIC_OR_DEPARTMENT_OTHER): Payer: Medicare Other

## 2023-07-15 ENCOUNTER — Encounter: Payer: Self-pay | Admitting: Hematology

## 2023-07-15 ENCOUNTER — Telehealth: Payer: Self-pay | Admitting: *Deleted

## 2023-07-15 DIAGNOSIS — Z0389 Encounter for observation for other suspected diseases and conditions ruled out: Secondary | ICD-10-CM | POA: Diagnosis not present

## 2023-07-15 DIAGNOSIS — I2699 Other pulmonary embolism without acute cor pulmonale: Secondary | ICD-10-CM | POA: Diagnosis not present

## 2023-07-15 DIAGNOSIS — D509 Iron deficiency anemia, unspecified: Secondary | ICD-10-CM | POA: Diagnosis not present

## 2023-07-15 DIAGNOSIS — E440 Moderate protein-calorie malnutrition: Secondary | ICD-10-CM | POA: Insufficient documentation

## 2023-07-15 DIAGNOSIS — R0602 Shortness of breath: Secondary | ICD-10-CM

## 2023-07-15 DIAGNOSIS — E039 Hypothyroidism, unspecified: Secondary | ICD-10-CM | POA: Diagnosis not present

## 2023-07-15 DIAGNOSIS — G3184 Mild cognitive impairment, so stated: Secondary | ICD-10-CM | POA: Diagnosis not present

## 2023-07-15 DIAGNOSIS — G4733 Obstructive sleep apnea (adult) (pediatric): Secondary | ICD-10-CM | POA: Diagnosis not present

## 2023-07-15 DIAGNOSIS — R1319 Other dysphagia: Secondary | ICD-10-CM | POA: Diagnosis not present

## 2023-07-15 HISTORY — DX: Moderate protein-calorie malnutrition: E44.0

## 2023-07-15 LAB — ECHOCARDIOGRAM COMPLETE
AR max vel: 1.65 cm2
AV Area VTI: 1.94 cm2
AV Area mean vel: 1.6 cm2
AV Mean grad: 4 mm[Hg]
AV Peak grad: 10 mm[Hg]
Ao pk vel: 1.58 m/s
Area-P 1/2: 4.68 cm2
Calc EF: 56.7 %
Height: 62 in
MV VTI: 2.16 cm2
S' Lateral: 3 cm
Single Plane A2C EF: 51.5 %
Single Plane A4C EF: 59.8 %
Weight: 2416 [oz_av]

## 2023-07-15 LAB — BASIC METABOLIC PANEL
Anion gap: 7 (ref 5–15)
BUN: 21 mg/dL (ref 8–23)
CO2: 21 mmol/L — ABNORMAL LOW (ref 22–32)
Calcium: 8.8 mg/dL — ABNORMAL LOW (ref 8.9–10.3)
Chloride: 108 mmol/L (ref 98–111)
Creatinine, Ser: 1.45 mg/dL — ABNORMAL HIGH (ref 0.44–1.00)
GFR, Estimated: 39 mL/min — ABNORMAL LOW (ref >60)
Glucose, Bld: 81 mg/dL (ref 70–99)
Potassium: 3.8 mmol/L (ref 3.5–5.1)
Sodium: 136 mmol/L (ref 135–145)

## 2023-07-15 LAB — CBC
HCT: 29.6 % — ABNORMAL LOW (ref 36.0–46.0)
Hemoglobin: 10.2 g/dL — ABNORMAL LOW (ref 12.0–15.0)
MCH: 33.3 pg (ref 26.0–34.0)
MCHC: 34.5 g/dL (ref 30.0–36.0)
MCV: 96.7 fL (ref 80.0–100.0)
Platelets: 179 10*3/uL (ref 150–400)
RBC: 3.06 MIL/uL — ABNORMAL LOW (ref 3.87–5.11)
RDW: 12.8 % (ref 11.5–15.5)
WBC: 5.3 10*3/uL (ref 4.0–10.5)
nRBC: 0 % (ref 0.0–0.2)

## 2023-07-15 LAB — HIV ANTIBODY (ROUTINE TESTING W REFLEX): HIV Screen 4th Generation wRfx: NONREACTIVE

## 2023-07-15 LAB — HEPARIN LEVEL (UNFRACTIONATED): Heparin Unfractionated: 0.6 [IU]/mL (ref 0.30–0.70)

## 2023-07-15 LAB — MAGNESIUM: Magnesium: 1.8 mg/dL (ref 1.7–2.4)

## 2023-07-15 MED ORDER — APIXABAN 5 MG PO TABS
10.0000 mg | ORAL_TABLET | Freq: Two times a day (BID) | ORAL | Status: DC
  Administered 2023-07-15 – 2023-07-16 (×3): 10 mg via ORAL
  Filled 2023-07-15 (×3): qty 2

## 2023-07-15 MED ORDER — BOOST / RESOURCE BREEZE PO LIQD CUSTOM
1.0000 | ORAL | Status: DC
  Administered 2023-07-15: 1 via ORAL

## 2023-07-15 MED ORDER — ADULT MULTIVITAMIN W/MINERALS CH
1.0000 | ORAL_TABLET | Freq: Every day | ORAL | Status: DC
  Administered 2023-07-15 – 2023-07-16 (×2): 1 via ORAL
  Filled 2023-07-15 (×2): qty 1

## 2023-07-15 MED ORDER — GUAIFENESIN-DM 100-10 MG/5ML PO SYRP
5.0000 mL | ORAL_SOLUTION | ORAL | Status: DC | PRN
  Administered 2023-07-15 (×2): 5 mL via ORAL
  Filled 2023-07-15 (×2): qty 5

## 2023-07-15 MED ORDER — APIXABAN 5 MG PO TABS: 5.0000 mg | ORAL_TABLET | Freq: Two times a day (BID) | ORAL | Status: DC

## 2023-07-15 MED ORDER — BOOST PLUS PO LIQD
237.0000 mL | ORAL | Status: DC
  Filled 2023-07-15: qty 237

## 2023-07-15 MED ORDER — ENSURE ENLIVE PO LIQD
237.0000 mL | ORAL | Status: DC
  Administered 2023-07-16: 237 mL via ORAL

## 2023-07-15 NOTE — Discharge Instructions (Addendum)
 1)Avoid ibuprofen /Advil /Aleve/Motrin Josefine Powders/Naproxen/BC powders/Meloxicam /Diclofenac/Indomethacin and other Nonsteroidal anti-inflammatory medications as these will make you more likely to bleed and can cause stomach ulcers, can also cause Kidney problems.   2)follow up with [name], MD (Primary Care Physician) in 1 to 2 weeks for recheck  3)Please note that there has been changes to your medications  Information on my medicine - ELIQUIS  (apixaban )  Why was Eliquis  prescribed for you? Eliquis  was prescribed to treat blood clots that may have been found in the veins of your legs (deep vein thrombosis) or in your lungs (pulmonary embolism) and to reduce the risk of them occurring again.  What do You need to know about Eliquis  ? The starting dose is 10 mg (two 5 mg tablets) taken TWICE daily for the FIRST SEVEN (7) DAYS, then on (enter date)  07/22/23  the dose is reduced to ONE 5 mg tablet taken TWICE daily.  Eliquis  may be taken with or without food.   Try to take the dose about the same time in the morning and in the evening. If you have difficulty swallowing the tablet whole please discuss with your pharmacist how to take the medication safely.  Take Eliquis  exactly as prescribed and DO NOT stop taking Eliquis  without talking to the doctor who prescribed the medication.  Stopping may increase your risk of developing a new blood clot.  Refill your prescription before you run out.  After discharge, you should have regular check-up appointments with your healthcare provider that is prescribing your Eliquis .    What do you do if you miss a dose? If a dose of ELIQUIS  is not taken at the scheduled time, take it as soon as possible on the same day and twice-daily administration should be resumed. The dose should not be doubled to make up for a missed dose.  Important Safety Information A possible side effect of Eliquis  is bleeding. You should call your healthcare provider  right away if you experience any of the following: Bleeding from an injury or your nose that does not stop. Unusual colored urine (red or dark brown) or unusual colored stools (red or black). Unusual bruising for unknown reasons. A serious fall or if you hit your head (even if there is no bleeding).  Some medicines may interact with Eliquis  and might increase your risk of bleeding or clotting while on Eliquis . To help avoid this, consult your healthcare provider or pharmacist prior to using any new prescription or non-prescription medications, including herbals, vitamins, non-steroidal anti-inflammatory drugs (NSAIDs) and supplements.  This website has more information on Eliquis  (apixaban ): http://www.eliquis .com/eliquis dena

## 2023-07-15 NOTE — Telephone Encounter (Signed)
 Pt's spouse Clide Cliff informed of providers message, verbalized understanding. Transferred to front desk to schedule office visit.

## 2023-07-15 NOTE — Telephone Encounter (Signed)
 Patient Product/process Development Scientist completed.    The patient is insured through [employer]. Patient has Medicare and is not eligible for a copay card, but may be able to apply for patient assistance or Medicare RX Payment Plan (Patient Must reach out to their plan, if eligible for payment plan), if available.    Ran test claim for Eliquis  5 mg and the current 30 day co-pay is $565.42 due to a deductible.   This test claim was processed through  [facility]- copay amounts may vary at other pharmacies due to pharmacy/plan contracts, or as the patient moves through the different stages of their insurance plan.     Reyes Sharps, CPHT Pharmacy Technician III Certified Patient Advocate [facility] Patient Advocate Team Direct Number: [phone]  Fax: [phone]

## 2023-07-15 NOTE — Telephone Encounter (Signed)
 Pt's spouse Clide Cliff (on dpr) called and left vm stating pt needed to cancel her procedure on 07/18/23 due to being in the hospital with blood clot in lung. Message sent to endo to cancel procedure.

## 2023-07-15 NOTE — Plan of Care (Signed)
   Problem: Education: Goal: Knowledge of General Education information will improve Description Including pain rating scale, medication(s)/side effects and non-pharmacologic comfort measures Outcome: Progressing

## 2023-07-15 NOTE — Telephone Encounter (Signed)
Please schedule pt for OV. Thank you

## 2023-07-15 NOTE — Care Management Obs Status (Signed)
 MEDICARE OBSERVATION STATUS NOTIFICATION   Patient Details  Name: Jessica [name] MRN: [id number] Date of Birth: Dec 03, 1950   Medicare Observation Status Notification Given:  Yes    Karn Cassis, LCSW 07/15/2023, 10:48 AM

## 2023-07-15 NOTE — Progress Notes (Signed)
 PROGRESS NOTE   Jessica [name]  FMW:[id number] DOB: [birth year] DOA: 07/14/2023 PCP: [name], MD   Chief Complaint  Patient presents with   Anxiety   Level of care: Telemetry  Brief Admission History:  73 year old female with Crohn's disease who started having recent flares and was treated with Entyvio  injections which she barely tolerated and was associated with subsequent recurrent infections requiring antibiotics, iron deficiency anemia, easy bruising, generalized anxiety disorder, stage IIIb CKD, hypothyroidism, neuropathy, GERD, chronic diarrhea, OSA, mild cognitive impairment, essential tremor, esophageal dysphagia (scheduled for EGD in 4 days) reports that she has been suffering from shortness of breath especially with exertion, exercise intolerance for past several months.  She sought medical care and was seen in ED and outpatient for several times and it was thought that she had pneumonia and she was treated with steroids and antibiotics but the dyspnea never resolved after she completed the treatment.  She also was told that she was having an anxiety attack and treated with lorazepam .    Today she presented to the ED with severe anxiety and shortness of breath symptoms.  She had been unable to sleep. She denied fever and chills. 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  She is being admitted for anticoagulation and further work up.     Assessment and Plan:  Pulmonary Embolism  - not sure how long this has been present as she reports having symptoms for months - pt started on IV heparin  infusion dosed by pharm D for 24 hours - check 2D echocardiogram to evaluate for right heart strain  - supplemental oxygen and supportive measures ordered - transition to DOAC prior to discharge, requested benefits check from pharmacy - following CBC    IDA - followed by hematology - receiving iron infusion  therapies - follow CBC   Crohn's Disease - followed by [facility]  - pt says she did not tolerate entyvio  infusions - resumed home antidiarrheals - resumed home GI medications    RLS - resumed home requip  at bedtime   OSA - will offer CPAP therapy in hospital with auto-titrate feature   Stage 3b CKD - completed gentle hydration and creatinine trending down   Esophageal dysphagia - dysphagia 3 diet  - EGD planned for Friday (outpatient) has been cancelled by husband - asked patient to notify GI regarding PE diagnosis  - Dr. Eartha requested pt be seen in office before rescheduling EGD - resumed home pantoprazole     Hypothyroidism  - resumed home levothyroxine  daily    Neuropathy - gabapentin  ordered PRN only  DVT prophylaxis: IV heparin -->apixaban  Code Status: Full  Family Communication: husband 1/6 Disposition: anticipate home tomorrow  Consultants:  Pharm D Procedures:   Antimicrobials:    Subjective: Pt still having shortness of breath but no chest pain or palpitations.  Says she saw no black stool or blood in stool.    Objective: Vitals:   07/15/23 0258 07/15/23 0435 07/15/23 0732 07/15/23 1009  BP: 118/62 106/65 111/74 (!) 101/58  Pulse: 65 64 62 69  Resp: 20 20 16    Temp: 97.8 F (36.6 C) 97.8 F (36.6 C) 97.8 F (36.6 C)   TempSrc: Oral Oral Oral   SpO2: 100% 100% 99%   Weight:      Height:        Intake/Output Summary (Last 24 hours) at 07/15/2023 1255 Last data filed at  07/15/2023 0435 Gross per 24 hour  Intake 656.05 ml  Output 2480 ml  Net -1823.95 ml   Filed Weights   07/14/23 0536  Weight: 68.5 kg   Examination:  General exam: Appears calm and comfortable  Respiratory system: Clear to auscultation. Respiratory effort normal. Cardiovascular system: normal S1 & S2 heard. No JVD, murmurs, rubs, gallops or clicks. No pedal edema. Gastrointestinal system: Abdomen is nondistended, soft and nontender. No organomegaly or masses felt.  Normal bowel sounds heard. Central nervous system: Alert and oriented. No focal neurological deficits. Extremities: Symmetric 5 x 5 power. Skin: thin skin with multiple areas of bruising seen; no ulcers seen. Psychiatry: Judgement and insight appear normal. Mood & affect appropriate.   Data Reviewed: I have personally reviewed following labs and imaging studies  CBC: Recent Labs  Lab 07/14/23 0615 07/14/23 0914 07/15/23 0423  WBC 5.8  --  5.3  NEUTROABS 4.0  --   --   HGB 9.8* 10.2* 10.2*  HCT 27.6* 30.0* 29.6*  MCV 95.8  --  96.7  PLT 180  --  179    Basic Metabolic Panel: Recent Labs  Lab 07/14/23 0615 07/14/23 0914 07/15/23 0423  NA 134* 140 136  K 3.5 4.3 3.8  CL 110 109 108  CO2 17*  --  21*  GLUCOSE 116* 82 81  BUN 32* 36* 21  CREATININE 1.85* 2.00* 1.45*  CALCIUM  8.6*  --  8.8*  MG  --   --  1.8    CBG: No results for input(s): GLUCAP in the last 168 hours.  No results found for this or any previous visit (from the past 240 hours).   Radiology Studies: ECHOCARDIOGRAM COMPLETE Result Date: 07/15/2023    ECHOCARDIOGRAM REPORT   Patient Name:   Jessica [name] Date of Exam: 07/15/2023 Medical Rec #:  [id number]      Height:       62.0 in Accession #:    [id number]     Weight:       151.0 lb Date of Birth:  [birth year]      BSA:          1.696 m Patient Age:    72 years       BP:           111/74 mmHg Patient Gender: F              HR:           61 bpm. Exam Location:  [facility] Procedure: 2D Echo, Cardiac Doppler and Color Doppler Indications:    Pulmonary embolus  History:        Patient has prior history of Echocardiogram examinations, most                 recent 03/13/2023. Signs/Symptoms:Shortness of Breath and Dyspnea;                 Risk Factors:Dyslipidemia, Hypertension and Sleep Apnea. CKD.  Sonographer:    Sabrina Gentry Referring Phys: [id number] L Laurance Heide IMPRESSIONS  1. Left ventricular ejection fraction, by estimation, is 55 to 60%. The left ventricle has  normal function. The left ventricle has no regional wall motion abnormalities. Left ventricular diastolic parameters were normal.  2. Right ventricular systolic function is normal. The right ventricular size is normal.  3. The mitral valve is abnormal. Mild mitral valve regurgitation. No evidence of mitral stenosis.  4. The aortic valve is tricuspid. There is moderate calcification of the  aortic valve. There is moderate thickening of the aortic valve. Aortic valve regurgitation is not visualized. No aortic stenosis is present.  5. The inferior vena cava is normal in size with greater than 50% respiratory variability, suggesting right atrial pressure of 3 mmHg. FINDINGS  Left Ventricle: Left ventricular ejection fraction, by estimation, is 55 to 60%. The left ventricle has normal function. The left ventricle has no regional wall motion abnormalities. The left ventricular internal cavity size was normal in size. There is  no left ventricular hypertrophy. Left ventricular diastolic parameters were normal. Right Ventricle: The right ventricular size is normal. Right vetricular wall thickness was not well visualized. Right ventricular systolic function is normal. Left Atrium: Left atrial size was normal in size. Right Atrium: Right atrial size was normal in size. Pericardium: There is no evidence of pericardial effusion. Mitral Valve: The mitral valve is abnormal. Mild mitral valve regurgitation. No evidence of mitral valve stenosis. MV peak gradient, 1.9 mmHg. The mean mitral valve gradient is 1.0 mmHg. Tricuspid Valve: The tricuspid valve is normal in structure. Tricuspid valve regurgitation is not demonstrated. No evidence of tricuspid stenosis. Aortic Valve: The aortic valve is tricuspid. There is moderate calcification of the aortic valve. There is moderate thickening of the aortic valve. There is moderate aortic valve annular calcification. Aortic valve regurgitation is not visualized. No aortic stenosis is  present. Aortic valve mean gradient measures 4.0 mmHg. Aortic valve peak gradient measures 10.0 mmHg. Aortic valve area, by VTI measures 1.94 cm. Pulmonic Valve: The pulmonic valve was not well visualized. Pulmonic valve regurgitation is not visualized. No evidence of pulmonic stenosis. Aorta: The aortic root and ascending aorta are structurally normal, with no evidence of dilitation. Venous: The inferior vena cava is normal in size with greater than 50% respiratory variability, suggesting right atrial pressure of 3 mmHg. IAS/Shunts: No atrial level shunt detected by color flow Doppler.  LEFT VENTRICLE PLAX 2D LVIDd:         4.20 cm      Diastology LVIDs:         3.00 cm      LV e' medial:    10.10 cm/s LV PW:         0.70 cm      LV E/e' medial:  7.8 LV IVS:        0.80 cm      LV e' lateral:   6.74 cm/s LVOT diam:     1.90 cm      LV E/e' lateral: 11.7 LV SV:         49 LV SV Index:   29 LVOT Area:     2.84 cm  LV Volumes (MOD) LV vol d, MOD A2C: 129.0 ml LV vol d, MOD A4C: 151.0 ml LV vol s, MOD A2C: 62.6 ml LV vol s, MOD A4C: 60.7 ml LV SV MOD A2C:     66.4 ml LV SV MOD A4C:     151.0 ml LV SV MOD BP:      80.2 ml RIGHT VENTRICLE             IVC RV Basal diam:  3.30 cm     IVC diam: 1.50 cm RV Mid diam:    2.40 cm RV S prime:     10.20 cm/s TAPSE (M-mode): 2.9 cm LEFT ATRIUM             Index        RIGHT ATRIUM  Index LA diam:        3.10 cm 1.83 cm/m   RA Area:     12.10 cm LA Vol (A2C):   67.6 ml 39.85 ml/m  RA Volume:   27.60 ml  16.27 ml/m LA Vol (A4C):   37.1 ml 21.87 ml/m LA Biplane Vol: 55.0 ml 32.42 ml/m  AORTIC VALVE                    PULMONIC VALVE AV Area (Vmax):    1.65 cm     PV Vmax:       0.91 m/s AV Area (Vmean):   1.60 cm     PV Peak grad:  3.3 mmHg AV Area (VTI):     1.94 cm AV Vmax:           158.00 cm/s AV Vmean:          95.600 cm/s AV VTI:            0.251 m AV Peak Grad:      10.0 mmHg AV Mean Grad:      4.0 mmHg LVOT Vmax:         91.70 cm/s LVOT Vmean:        54.000  cm/s LVOT VTI:          0.172 m LVOT/AV VTI ratio: 0.69  AORTA Ao Root diam: 3.10 cm Ao Asc diam:  3.30 cm MITRAL VALVE MV Area (PHT): 4.68 cm    SHUNTS MV Area VTI:   2.16 cm    Systemic VTI:  0.17 m MV Peak grad:  1.9 mmHg    Systemic Diam: 1.90 cm MV Mean grad:  1.0 mmHg MV Vmax:       0.68 m/s MV Vmean:      49.5 cm/s MV Decel Time: 162 msec MV E velocity: 78.60 cm/s MV A velocity: 82.30 cm/s MV E/A ratio:  0.96 Dorn Ross MD Electronically signed by Dorn Ross MD Signature Date/Time: 07/15/2023/11:20:41 AM    Final    US  Venous Img Lower Bilateral (DVT) Result Date: 07/15/2023 CLINICAL DATA:  Nuclear medicine study suspicious for pulmonary embolism. EXAM: BILATERAL LOWER EXTREMITY VENOUS DOPPLER ULTRASOUND TECHNIQUE: Gray-scale sonography with graded compression, as well as color Doppler and duplex ultrasound were performed to evaluate the lower extremity deep venous systems from the level of the common femoral vein and including the common femoral, femoral, profunda femoral, popliteal and calf veins including the posterior tibial, peroneal and gastrocnemius veins when visible. The superficial great saphenous vein was also interrogated. Spectral Doppler was utilized to evaluate flow at rest and with distal augmentation maneuvers in the common femoral, femoral and popliteal veins. COMPARISON:  None Available. FINDINGS: RIGHT LOWER EXTREMITY Common Femoral Vein: No evidence of thrombus. Normal compressibility, respiratory phasicity and response to augmentation. Saphenofemoral Junction: No evidence of thrombus. Normal compressibility and flow on color Doppler imaging. Profunda Femoral Vein: No evidence of thrombus. Normal compressibility and flow on color Doppler imaging. Femoral Vein: No evidence of thrombus. Normal compressibility, respiratory phasicity and response to augmentation. Popliteal Vein: No evidence of thrombus. Normal compressibility, respiratory phasicity and response to augmentation.  Calf Veins: No evidence of thrombus. Normal compressibility and flow on color Doppler imaging. Superficial Great Saphenous Vein: No evidence of thrombus. Normal compressibility. Venous Reflux:  None. Other Findings: No evidence of superficial thrombophlebitis or abnormal fluid collection. LEFT LOWER EXTREMITY Common Femoral Vein: No evidence of thrombus. Normal compressibility, respiratory phasicity and response to augmentation. Saphenofemoral  Junction: No evidence of thrombus. Normal compressibility and flow on color Doppler imaging. Profunda Femoral Vein: No evidence of thrombus. Normal compressibility and flow on color Doppler imaging. Femoral Vein: No evidence of thrombus. Normal compressibility, respiratory phasicity and response to augmentation. Popliteal Vein: No evidence of thrombus. Normal compressibility, respiratory phasicity and response to augmentation. Calf Veins: No evidence of thrombus. Normal compressibility and flow on color Doppler imaging. Superficial Great Saphenous Vein: No evidence of thrombus. Normal compressibility. Venous Reflux:  None. Other Findings: No evidence of superficial thrombophlebitis or abnormal fluid collection. IMPRESSION: No evidence of deep venous thrombosis in either lower extremity. Electronically Signed   By: Marcey Moan M.D.   On: 07/15/2023 10:52   NM Pulmonary Perfusion Result Date: 07/14/2023 CLINICAL DATA:  Several month history of shortness of breath EXAM: NUCLEAR MEDICINE PERFUSION LUNG SCAN TECHNIQUE: Perfusion images were obtained in multiple projections after intravenous injection of radiopharmaceutical. Ventilation scans intentionally deferred if perfusion scan and chest x-ray adequate for interpretation during COVID 19 epidemic. RADIOPHARMACEUTICALS:  4.4 mCi Tc-70m MAA IV COMPARISON:  Same day chest radiograph FINDINGS: Peripheral, wedge-shaped perfusion defect along the anterior right middle lobe. IMPRESSION: Pulmonary embolism present. Critical  Value/emergent results were called by telephone at the time of interpretation on 07/14/2023 at 12:59 pm to provider [facility] , who verbally acknowledged these results. Electronically Signed   By: Limin  Xu M.D.   On: 07/14/2023 12:59   DG Chest Port 1 View Result Date: 07/14/2023 CLINICAL DATA:  Shortness of breath. EXAM: PORTABLE CHEST 1 VIEW COMPARISON:  06/19/2023.  11/15/2020. FINDINGS: The lungs are clear without focal pneumonia, edema, pneumothorax or pleural effusion. Tiny granuloma lateral left lower lung stable since the 2022 exam. The cardio pericardial silhouette is enlarged. No acute bony abnormality. Telemetry leads overlie the chest. IMPRESSION: Enlargement of the cardiopericardial silhouette without acute cardiopulmonary findings. Electronically Signed   By: Camellia Candle M.D.   On: 07/14/2023 06:11   Scheduled Meds:  amLODipine   5 mg Oral Daily   apixaban   10 mg Oral BID   Followed by   NOREEN ON 07/22/2023] apixaban   5 mg Oral BID   buPROPion   150 mg Oral q morning   dicyclomine   10 mg Oral TID AC   feeding supplement  1 Container Oral Q24H   [START ON 07/16/2023] feeding supplement  237 mL Oral Q24H   ferrous sulfate   325 mg Oral Q breakfast   lactose free nutrition  237 mL Oral Q24H   levothyroxine   100 mcg Oral Q0600   metoprolol  succinate  25 mg Oral Daily   multivitamin with minerals  1 tablet Oral Daily   pantoprazole   40 mg Oral BID   rOPINIRole   1 mg Oral QHS   rosuvastatin   10 mg Oral QHS   Continuous Infusions:  sodium chloride  50 mL/hr at 07/15/23 0308    LOS: 1 day   Time spent: 57 mins  Tadeo Besecker Vicci, MD How to contact the [facility] Attending or Consulting provider 7A - 7P or covering provider during after hours 7P -7A, for this patient?  Check the care team in [facility] and look for a) attending/consulting [facility] provider listed and b) the [facility] team listed Log into [email or web address] to find provider on call.  Locate the [facility] provider you are looking for under [facility] and page to a number that you can be directly reached. If you still have difficulty reaching the provider, please page the [facility] (Director on Call) for the Hospitalists listed  on amion for assistance.  07/15/2023, 12:55 PM

## 2023-07-15 NOTE — Progress Notes (Addendum)
 PHARMACY - ANTICOAGULATION CONSULT NOTE  Pharmacy Consult for Heparin  Indication: pulmonary embolus  Allergies  Allergen Reactions   Morphine And Codeine Hives and Itching    May cause blood pressure to drop   Robaxin  [Methocarbamol ] Itching   Sulfa Antibiotics Itching    ALL SULFA DRUGS    Patient Measurements: Height: 5' 2 (157.5 cm) Weight: 68.5 kg (151 lb) IBW/kg (Calculated) : 50.1 HEPARIN  DW (KG): 64.4   Vital Signs: Temp: 97.8 F (36.6 C) (01/07 0732) Temp Source: Oral (01/07 0732) BP: 101/58 (01/07 1009) Pulse Rate: 69 (01/07 1009)  Labs: Recent Labs    07/14/23 0615 07/14/23 0619 07/14/23 0914 07/14/23 2133 07/15/23 0423  HGB 9.8*  --  10.2*  --  10.2*  HCT 27.6*  --  30.0*  --  29.6*  PLT 180  --   --   --  179  HEPARINUNFRC  --   --   --  0.73* 0.60  CREATININE 1.85*  --  2.00*  --  1.45*  TROPONINIHS  --  4  --   --   --     Estimated Creatinine Clearance: 32.8 mL/min (A) (by C-G formula based on SCr of 1.45 mg/dL (H)).  Assessment: Patient presented to ED with some SOB. Exhibiting anxiety in the setting of vedolizumab  treatment for Crohn's disease that her issues maybe related to the injection. Chest x-ray shows no evidence of pneumonia. Some SOB,d-Dimer elevated=> unable to do CTA, plan for VQ scan. Not on any oral anticoagulants. Pharmacy asked to start heparin .  VQ scan shows PE present.   Heparin  infusion adjusted overnight, level now within goal at 0.6. CBC stable. No bleeding issues noted.   Goal of Therapy:  Heparin  level 0.3-0.7 units/ml Monitor platelets by anticoagulation protocol: Yes   Plan:  Continue heparin  infusion at 1050 units/hr Confirmatory heparin  level this afternoon Continue to monitor H&H and platelets  Addendum: Discussed anticoagulation plan with primary team, will change to apixaban  this afternoon after 24 hours of IV heparin  are complete.  Apixaban  10mg  bid x 7 days then 5mg  bid  Dempsey Blush PharmD.,  BCPS Clinical Pharmacist 07/15/2023 11:09 AM

## 2023-07-15 NOTE — Telephone Encounter (Signed)
 Thanks Will need to see her in the office prior to rescheduling

## 2023-07-15 NOTE — Progress Notes (Signed)
 Initial Nutrition Assessment  DOCUMENTATION CODES:   Non-severe (moderate) malnutrition in context of chronic illness  INTERVENTION:   Ensure Enlive po once daily, each supplement provides 350 kcal and 20 grams of protein. Boost Breeze po once daily, each supplement provides 250 kcal and 9 grams of protein. Boost Plus po once daily, each supplement provides 360 kcal and 14 gm protein. MVI with minerals daily.  NUTRITION DIAGNOSIS:   Moderate Malnutrition related to chronic illness (Crohn's disease) as evidenced by mild muscle depletion, mild fat depletion.  GOAL:   Patient will meet greater than or equal to 90% of their needs  MONITOR:   PO intake, Supplement acceptance  REASON FOR ASSESSMENT:   Malnutrition Screening Tool    ASSESSMENT:   73 yo female admitted with pulmonary embolism. PMH includes Crohn's disease, neuropathy, hypothyroidism, HTN, back pain, GERD, vertigo, CKD, iron deficiency anemia.  Patient reports multiple recent Crohn's flares over the past few months with a lot of loose stools and GI distress. She has been eating poorly and has lost weight. She agreed to drink PO supplements between meals. We decided to try Boost Breeze, Boost Plus, and Ensure Plus High Protein to see which on she tolerates best.   Labs reviewed.  Medications reviewed and include ferrous sulfate , protonix .  Weight history reviewed. Patient has had 4% weight loss within the past 3 months.   Patient meets criteria for moderate malnutrition, given mild depletion of muscle and subcutaneous fat mass.  NUTRITION - FOCUSED PHYSICAL EXAM:  Flowsheet Row Most Recent Value  Orbital Region Mild depletion  Upper Arm Region Mild depletion  Thoracic and Lumbar Region Mild depletion  Buccal Region Mild depletion  Temple Region Mild depletion  Clavicle Bone Region Mild depletion  Clavicle and Acromion Bone Region Mild depletion  Scapular Bone Region Mild depletion  Dorsal Hand Mild  depletion  Patellar Region Mild depletion  Anterior Thigh Region Mild depletion  Posterior Calf Region Mild depletion  Edema (RD Assessment) None  Hair Reviewed  Eyes Reviewed  Mouth Reviewed  Skin Reviewed  [thin, lots of bruising]  Nails Reviewed       Diet Order:   Diet Order             DIET SOFT Room service appropriate? Yes; Fluid consistency: Thin  Diet effective now                   EDUCATION NEEDS:   No education needs have been identified at this time  Skin:  Skin Assessment: Reviewed RN Assessment  Last BM:  no BM documented  Height:   Ht Readings from Last 1 Encounters:  07/14/23 5' 2 (1.575 m)    Weight:   Wt Readings from Last 1 Encounters:  07/14/23 68.5 kg    Ideal Body Weight:  50 kg  BMI:  Body mass index is 27.62 kg/m.  Estimated Nutritional Needs:   Kcal:  1600-1800  Protein:  80-90 gm  Fluid:  1.6-1.8 L   Suzen HUNT RD, LDN, CNSC Contact Inpatient RD using Secure Chat. If unavailable, use group chat RD Inpatient via Secure Chat in EPIC.

## 2023-07-15 NOTE — Progress Notes (Signed)
  Echocardiogram 2D Echocardiogram has been performed.  Ocie Doyne RDCS 07/15/2023, 9:34 AM

## 2023-07-15 NOTE — Progress Notes (Signed)
 Transition of Care Department [facility]) has reviewed patient and no other TOC needs have been identified at this time. We will continue to monitor patient advancement through interdisciplinary progression rounds. If new patient transition needs arise, please place a TOC consult.   07/15/23 1511  TOC Brief Assessment  Insurance and Status Reviewed  Patient has primary care physician Yes  Home environment has been reviewed Lives with husband.  Prior level of function: Fairly independent.  Prior/Current Home Services No current home services  Social Drivers of Health Review SDOH reviewed no interventions necessary  Readmission risk has been reviewed Yes  Transition of care needs no transition of care needs at this time

## 2023-07-15 NOTE — Plan of Care (Signed)

## 2023-07-15 NOTE — Progress Notes (Signed)
 Mobility Specialist Progress Note:    07/15/23 1513  Mobility  Activity Transferred to/from [facility]  Level of Assistance Standby assist, set-up cues, supervision of patient - no hands on  Assistive Device None  Distance Ambulated (ft) 3 ft  Range of Motion/Exercises Active;All extremities  Activity Response Tolerated well  Mobility Referral Yes  Mobility visit 1 Mobility  Mobility Specialist Start Time (ACUTE ONLY) 1435  Mobility Specialist Stop Time (ACUTE ONLY) 1445  Mobility Specialist Time Calculation (min) (ACUTE ONLY) 10 min   Pt received requesting assistance to [facility]. Required SBA to stand and transfer with no AD. Tolerated well, asx throughout. Left pt on BSC with call bell, notified NT. All needs met.   Sherrilee Ditty Mobility Specialist Please contact via Special Educational Needs Teacher or  Rehab office at [phone]

## 2023-07-15 NOTE — Care Management CC44 (Signed)
 Condition Code 44 Documentation Completed  Patient Details  Name: Jessica [name] MRN: [id number] Date of Birth: December 22, 1950   Condition Code 44 given:  Yes Patient signature on Condition Code 44 notice:  Yes Documentation of 2 MD's agreement:  Yes Code 44 added to claim:  Yes    [place]arthur Saddie Kim, LCSW 07/15/2023, 10:48 AM

## 2023-07-16 ENCOUNTER — Inpatient Hospital Stay: Payer: Medicare Other

## 2023-07-16 ENCOUNTER — Encounter (HOSPITAL_COMMUNITY): Payer: Medicare Other

## 2023-07-16 DIAGNOSIS — G4733 Obstructive sleep apnea (adult) (pediatric): Secondary | ICD-10-CM | POA: Diagnosis not present

## 2023-07-16 DIAGNOSIS — E038 Other specified hypothyroidism: Secondary | ICD-10-CM | POA: Diagnosis not present

## 2023-07-16 DIAGNOSIS — I2699 Other pulmonary embolism without acute cor pulmonale: Secondary | ICD-10-CM | POA: Diagnosis not present

## 2023-07-16 DIAGNOSIS — D500 Iron deficiency anemia secondary to blood loss (chronic): Secondary | ICD-10-CM

## 2023-07-16 DIAGNOSIS — N1832 Chronic kidney disease, stage 3b: Secondary | ICD-10-CM

## 2023-07-16 LAB — CBC
HCT: 30.9 % — ABNORMAL LOW (ref 36.0–46.0)
Hemoglobin: 10.7 g/dL — ABNORMAL LOW (ref 12.0–15.0)
MCH: 33.6 pg (ref 26.0–34.0)
MCHC: 34.6 g/dL (ref 30.0–36.0)
MCV: 97.2 fL (ref 80.0–100.0)
Platelets: 166 10*3/uL (ref 150–400)
RBC: 3.18 MIL/uL — ABNORMAL LOW (ref 3.87–5.11)
RDW: 12.6 % (ref 11.5–15.5)
WBC: 4.7 10*3/uL (ref 4.0–10.5)
nRBC: 0 % (ref 0.0–0.2)

## 2023-07-16 LAB — BASIC METABOLIC PANEL
Anion gap: 8 (ref 5–15)
BUN: 21 mg/dL (ref 8–23)
CO2: 22 mmol/L (ref 22–32)
Calcium: 9.2 mg/dL (ref 8.9–10.3)
Chloride: 104 mmol/L (ref 98–111)
Creatinine, Ser: 1.55 mg/dL — ABNORMAL HIGH (ref 0.44–1.00)
GFR, Estimated: 36 mL/min — ABNORMAL LOW (ref >60)
Glucose, Bld: 100 mg/dL — ABNORMAL HIGH (ref 70–99)
Potassium: 4.3 mmol/L (ref 3.5–5.1)
Sodium: 134 mmol/L — ABNORMAL LOW (ref 135–145)

## 2023-07-16 MED ORDER — APIXABAN 5 MG PO TABS: 5.0000 mg | ORAL_TABLET | Freq: Two times a day (BID) | ORAL | 4 refills | Status: AC

## 2023-07-16 MED ORDER — METOPROLOL SUCCINATE ER 25 MG PO TB24: 37.5000 mg | ORAL_TABLET | Freq: Every day | ORAL | 3 refills | Status: DC

## 2023-07-16 MED ORDER — ELIQUIS DVT/PE STARTER PACK 5 MG PO TBPK: ORAL_TABLET | ORAL | 0 refills | Status: DC

## 2023-07-16 MED ORDER — PANTOPRAZOLE SODIUM 40 MG PO TBEC: 40.0000 mg | DELAYED_RELEASE_TABLET | Freq: Two times a day (BID) | ORAL | 3 refills | Status: DC

## 2023-07-16 MED ORDER — FERROUS SULFATE 325 (65 FE) MG PO TBEC: 325.0000 mg | DELAYED_RELEASE_TABLET | Freq: Every day | ORAL | 3 refills | Status: DC

## 2023-07-16 MED ORDER — ACETAMINOPHEN 325 MG PO TABS: 650.0000 mg | ORAL_TABLET | Freq: Four times a day (QID) | ORAL | Status: AC | PRN

## 2023-07-16 NOTE — Plan of Care (Signed)
   Problem: Education: Goal: Knowledge of General Education information will improve Description Including pain rating scale, medication(s)/side effects and non-pharmacologic comfort measures Outcome: Progressing

## 2023-07-16 NOTE — Discharge Summary (Addendum)
 Jessica [name], is a 73 y.o. female  DOB [birth year]  MRN [id number].  Admission date:  07/14/2023  Admitting Physician  Afton LITTIE Louder, MD  Discharge Date:  07/16/2023   Primary MD  [name], MD  Recommendations for primary care physician for things to follow:  1)Avoid ibuprofen /Advil /Aleve/Motrin Josefine Powders/Naproxen/BC powders/Meloxicam /Diclofenac/Indomethacin and other Nonsteroidal anti-inflammatory medications as these will make you more likely to bleed and can cause stomach ulcers, can also cause Kidney problems.   2)follow up with [name], MD (Primary Care Physician) in 1 to 2 weeks for recheck  3)Please note that there has been changes to your medications  Admission Diagnosis  Anxiety [F41.9] Hyponatremia [E87.1] Pulmonary embolism (HCC) [I26.99] Renal insufficiency [N28.9] Normochromic normocytic anemia [D64.9] Elevated random blood glucose level [R73.9] Other pulmonary embolism without acute cor pulmonale, unspecified chronicity (HCC) [I26.99]   Discharge Diagnosis  Anxiety [F41.9] Hyponatremia [E87.1] Pulmonary embolism (HCC) [I26.99] Renal insufficiency [N28.9] Normochromic normocytic anemia [D64.9] Elevated random blood glucose level [R73.9] Other pulmonary embolism without acute cor pulmonale, unspecified chronicity (HCC) [I26.99]    Principal Problem:   Pulmonary embolism (HCC) Active Problems:   Hypothyroidism   Neuropathy   GERD (gastroesophageal reflux disease)   Chronic diarrhea   Crohn's disease of small intestine (HCC)   Dyspnea on exertion   OSA (obstructive sleep apnea)   Sensorineural hearing loss (SNHL), bilateral   Memory loss   Mild cognitive impairment   Essential tremor   Restless leg syndrome   Easy bruising   IDA (iron deficiency anemia)   Esophageal dysphagia   Stage 3b chronic kidney disease (CKD) (HCC)   Malnutrition of moderate degree       Past Medical History:  Diagnosis Date   Anxiety attack    Arthritis    Back pain, chronic    Chronic kidney disease    Crohn's colitis (HCC)    Depression (emotion)    Dysrhythmia    GERD (gastroesophageal reflux disease)    Hypertension    Hypothyroid    Iron deficiency anemia, unspecified    Neuropathy    Sleep apnea    cpap is not working per patient - needs to find a new doctor - not used since 7/16 per pat    Stage 3 chronic kidney disease (HCC)    Vertigo    followed by Dr Kievie- in [place]     Past Surgical History:  Procedure Laterality Date   ABDOMINAL HYSTERECTOMY     BIOPSY  06/08/2018   Procedure: BIOPSY;  Surgeon: Golda Claudis PENNER, MD;  [facility];  Service: Endoscopy;;  (colon)   BIOPSY  04/01/2023   Procedure: BIOPSY;  Surgeon: Eartha Angelia Sieving, MD;  [facility];  Service: Gastroenterology;;   CHOLECYSTECTOMY     COLON SURGERY     for Crohn's in the 80s in [place]    COLONOSCOPY N/A 03/11/2013   Procedure: COLONOSCOPY;  Surgeon: Claudis PENNER Golda, MD;  [facility];  Service: Endoscopy;  Laterality: N/A;  1200  COLONOSCOPY N/A 06/08/2018   Procedure: COLONOSCOPY;  Surgeon: Golda Claudis PENNER, MD;  [facility];  Service: Endoscopy;  Laterality: N/A;  8:25   COLONOSCOPY WITH PROPOFOL  N/A 04/01/2023   Procedure: COLONOSCOPY WITH PROPOFOL ;  Surgeon: Eartha Angelia Sieving, MD;  [facility];  Service: Gastroenterology;  Laterality: N/A;   ESOPHAGEAL BRUSHING  04/01/2023   Procedure: ESOPHAGEAL BRUSHING;  Surgeon: Eartha Angelia, Sieving, MD;  [facility];  Service: Gastroenterology;;   ESOPHAGOGASTRODUODENOSCOPY (EGD) WITH PROPOFOL  N/A 04/01/2023   Procedure: ESOPHAGOGASTRODUODENOSCOPY (EGD) WITH PROPOFOL ;  Surgeon: Eartha Angelia Sieving, MD;  [facility];  Service: Gastroenterology;  Laterality: N/A;  1:00am;asa 3   fatty tumor removed     HARDWARE REMOVAL Right 08/07/2016    Procedure: HARDWARE REMOVAL;  Surgeon: Dempsey Moan, MD;  Location: [facility];  Service: Orthopedics;  Laterality: Right;   Hip replacement      Rt hip in 2010 in [place]   KNEE ARTHROSCOPY Left 03/29/2015   Procedure: ARTHROSCOPY LEFT KNEE WITH MENSICAL DEBRIDEMENT, chondroplasty;  Surgeon: Dempsey Moan, MD;  Location: [facility];  Service: Orthopedics;  Laterality: Left;   ORIF TIBIA PLATEAU Right 02/29/2016   Procedure: OPEN REDUCTION INTERNAL FIXATION (ORIF RIGHT  TIBIAL PLATEAU FRACTURE;  Surgeon: Dempsey Moan, MD;  Location: [facility];  Service: Orthopedics;  Laterality: Right;   POLYPECTOMY  04/01/2023   Procedure: POLYPECTOMY;  Surgeon: Eartha Angelia Sieving, MD;  [facility];  Service: Gastroenterology;;   TEE WITHOUT CARDIOVERSION N/A 03/27/2021   Procedure: TRANSESOPHAGEAL ECHOCARDIOGRAM (TEE);  Surgeon: Alvan Dorn FALCON, MD;  [facility];  Service: Endoscopy;  Laterality: N/A;   TOTAL KNEE ARTHROPLASTY Right 08/25/2017   Procedure: TOTAL RIGHT KNEE ARTHROPLASTY;  Surgeon: Moan Dempsey, MD;  Location[facility];  Service: Orthopedics;  Laterality: Right;     HPI  from the history and physical done on the day of admission:     Chief Complaint: shortness of breath    HPI: Jessica [name] is a 73 year old female with Crohn's disease who started having recent flares and was treated with Entyvio  injections which she barely tolerated and was associated with subsequent recurrent infections requiring antibiotics, iron deficiency anemia, easy bruising, generalized anxiety disorder, stage IIIb CKD, hypothyroidism, neuropathy, GERD, chronic diarrhea, OSA, mild cognitive impairment, essential tremor, esophageal dysphagia (scheduled for EGD in 4 days) reports that she has been suffering from shortness of breath especially with exertion, exercise intolerance for past several months.  She sought medical care and was seen in ED and outpatient for several times and it was thought  that she had pneumonia and she was treated with steroids and antibiotics but the dyspnea never resolved after she completed the treatment.  She also was told that she was having an anxiety attack and treated with lorazepam .     Today she presented to the ED with severe anxiety and shortness of breath symptoms.  She had been unable to sleep. She denied fever and chills. 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  She is being admitted for anticoagulation and further work up.     Hospital Course:    Assessment and Plan: 1)Acute Pulmonary Embolism ---V/Q scan consistent with pulmonary embolism - ?? If provoked----patient took a trip to [place]  from 06/27/2023 through 07/05/2023 including a 24-hour layover at the airport due to weather delay --Lower extremity Dopplers without DVT -Echo  with EF of 55 to 60%, without right heart strain, no diastolic dysfunction, no wall motion abnormalities, no aortic stenosis no mitral stenosis -Treated with IV heparin  -Okay to discharge on Eliquis  for at least 6 months -Avoid NSAIDs -No hypoxia -   2) chronic IDA - receiving iron infusion therapies as outpatient prior to admission -Hgb currently stable above 10 --Continue ongoing follow-up with hematology as outpatient   3)Crohn's Disease - followed by [facility]  - pt says she did not tolerate entyvio  infusions - c/n  home antidiarrheals C/n  home GI medications    4)RLS -c/n home requip  at bedtime   5)OSA - will offer CPAP therapy in hospital with auto-titrate feature   6)Stage 3b CKD -Creatinine is down to 1.55 from 2.0 --Renal function stable and appears back to baseline - renally adjust medications, avoid nephrotoxic agents / dehydration  / hypotension    7)Esophageal dysphagia - dysphagia 3 diet  - EGD planned for Friday (outpatient) has been cancelled by husband - asked patient to notify GI regarding PE  diagnosis  - Dr. Eartha requested pt be seen in office before rescheduling EGD - c/n  home pantoprazole     8)Hypothyroidism  - resumed home levothyroxine  daily    9)Neuropathy - c/n gabapentin      Discharge Condition: stable  Follow UP   Follow-up Information     [facility] Follow up.   Why: Will contact you to schedule home health visits.        [name], MD. Schedule an appointment as soon as possible for a visit in 1 week(s).   Specialty: Internal Medicine Contact information: [address] [place] [address] [phone]                 Diet and Activity recommendation:  As advised  Discharge Instructions    Discharge Instructions     Call MD for:  difficulty breathing, headache or visual disturbances   Complete by: As directed    Call MD for:  persistant dizziness or light-headedness   Complete by: As directed    Call MD for:  persistant nausea and vomiting   Complete by: As directed    Call MD for:  temperature >100.4   Complete by: As directed    Diet - low sodium heart healthy   Complete by: As directed    Discharge instructions   Complete by: As directed    1)Avoid ibuprofen /Advil /Aleve/Motrin Josefine Powders/Naproxen/BC powders/Meloxicam /Diclofenac/Indomethacin and other Nonsteroidal anti-inflammatory medications as these will make you more likely to bleed and can cause stomach ulcers, can also cause Kidney problems.   2)follow up with [name], MD (Primary Care Physician) in 1 to 2 weeks for recheck  3)Please note that there has been changes to your medications   Increase activity slowly   Complete by: As directed         Discharge Medications     Allergies as of 07/16/2023       Reactions   Morphine And Codeine Hives, Itching   May cause blood pressure to drop   Robaxin  [methocarbamol ] Itching   Sulfa Antibiotics Itching   ALL SULFA DRUGS        Medication List     STOP taking these medications     potassium chloride  10 MEQ tablet Commonly known as: KLOR-CON  M       TAKE these medications    acetaminophen  325 MG tablet Commonly known as: TYLENOL  Take 2 tablets (650 mg  total) by mouth every 6 (six) hours as needed for mild pain (pain score 1-3), fever or headache (or Fever >/= 101).   amLODipine  10 MG tablet Commonly known as: NORVASC  Take 0.5 tablets (5 mg total) by mouth daily.   buPROPion  150 MG 24 hr tablet Commonly known as: WELLBUTRIN  XL TAKE 1 TABLET BY MOUTH ONCE DAILY IN THE MORNING   dicyclomine  10 MG capsule Commonly known as: BENTYL  TAKE 1 CAPSULE BY MOUTH TWICE DAILY BEFORE A MEAL   Eliquis  DVT/PE Starter Pack Generic drug: Apixaban  Starter Pack (10mg  and 5mg ) Take as directed on package: start with two-5mg  tablets twice daily for 7 days. On day 8, switch to one-5mg  tablet twice daily.   apixaban  5 MG Tabs tablet Commonly known as: ELIQUIS  Take 1 tablet (5 mg total) by mouth 2 (two) times daily. Start taking on: August 13, 2023   ferrous sulfate  325 (65 FE) MG EC tablet Take 1 tablet (325 mg total) by mouth daily with breakfast.   gabapentin  300 MG capsule Commonly known as: NEURONTIN  TAKE 1 CAPSULE BY MOUTH THREE TIMES DAILY   HYDROcodone -acetaminophen  5-325 MG tablet Commonly known as: NORCO/VICODIN Take 1 tablet by mouth every 6 (six) hours as needed for up to 20 doses for moderate pain (pain score 4-6).   levothyroxine  100 MCG tablet Commonly known as: SYNTHROID  Take 100 mcg by mouth daily.   loperamide  2 MG capsule Commonly known as: IMODIUM  TAKE 1 CAPSULE BY MOUTH TWICE DAILY AS NEEDED FOR LOOSE STOOLS   metoprolol  succinate 25 MG 24 hr tablet Commonly known as: TOPROL -XL Take 1.5 tablets (37.5 mg total) by mouth daily. TAKE 1 & 1/2 (ONE & ONE-HALF) TABLETS BY MOUTH ONCE DAILY What changed:  how much to take how to take this when to take this   multivitamin with minerals Tabs tablet Take 1 tablet by mouth daily.    pantoprazole  40 MG tablet Commonly known as: PROTONIX  Take 1 tablet (40 mg total) by mouth 2 (two) times daily.   rOPINIRole  1 MG tablet Commonly known as: REQUIP  Take 1 mg by mouth at bedtime.   rosuvastatin  10 MG tablet Commonly known as: Crestor  Take 1 tablet (10 mg total) by mouth daily.   spironolactone 50 MG tablet Commonly known as: ALDACTONE Take 50 mg by mouth daily.       Major procedures and Radiology Reports - PLEASE review detailed and final reports for all details, in brief -   ECHOCARDIOGRAM COMPLETE Result Date: 07/15/2023    ECHOCARDIOGRAM REPORT   Patient Name:   [name] Date of Exam: 07/15/2023 Medical Rec #:  [id number]      Height:       62.0 in Accession #:    [id number]     Weight:       151.0 lb Date of Birth:  [birth year]      BSA:          1.696 m Patient Age:    72 years       BP:           111/74 mmHg Patient Gender: F              HR:           61 bpm. Exam Location:  [facility] Procedure: 2D Echo, Cardiac Doppler and Color Doppler Indications:    Pulmonary embolus  History:        Patient has prior history of Echocardiogram examinations, most  recent 03/13/2023. Signs/Symptoms:Shortness of Breath and Dyspnea;                 Risk Factors:Dyslipidemia, Hypertension and Sleep Apnea. CKD.  Sonographer:    Sabrina Gentry Referring Phys: [id number] CLANFORD L JOHNSON IMPRESSIONS  1. Left ventricular ejection fraction, by estimation, is 55 to 60%. The left ventricle has normal function. The left ventricle has no regional wall motion abnormalities. Left ventricular diastolic parameters were normal.  2. Right ventricular systolic function is normal. The right ventricular size is normal.  3. The mitral valve is abnormal. Mild mitral valve regurgitation. No evidence of mitral stenosis.  4. The aortic valve is tricuspid. There is moderate calcification of the aortic valve. There is moderate thickening of the aortic valve. Aortic valve regurgitation is not visualized.  No aortic stenosis is present.  5. The inferior vena cava is normal in size with greater than 50% respiratory variability, suggesting right atrial pressure of 3 mmHg. FINDINGS  Left Ventricle: Left ventricular ejection fraction, by estimation, is 55 to 60%. The left ventricle has normal function. The left ventricle has no regional wall motion abnormalities. The left ventricular internal cavity size was normal in size. There is  no left ventricular hypertrophy. Left ventricular diastolic parameters were normal. Right Ventricle: The right ventricular size is normal. Right vetricular wall thickness was not well visualized. Right ventricular systolic function is normal. Left Atrium: Left atrial size was normal in size. Right Atrium: Right atrial size was normal in size. Pericardium: There is no evidence of pericardial effusion. Mitral Valve: The mitral valve is abnormal. Mild mitral valve regurgitation. No evidence of mitral valve stenosis. MV peak gradient, 1.9 mmHg. The mean mitral valve gradient is 1.0 mmHg. Tricuspid Valve: The tricuspid valve is normal in structure. Tricuspid valve regurgitation is not demonstrated. No evidence of tricuspid stenosis. Aortic Valve: The aortic valve is tricuspid. There is moderate calcification of the aortic valve. There is moderate thickening of the aortic valve. There is moderate aortic valve annular calcification. Aortic valve regurgitation is not visualized. No aortic stenosis is present. Aortic valve mean gradient measures 4.0 mmHg. Aortic valve peak gradient measures 10.0 mmHg. Aortic valve area, by VTI measures 1.94 cm. Pulmonic Valve: The pulmonic valve was not well visualized. Pulmonic valve regurgitation is not visualized. No evidence of pulmonic stenosis. Aorta: The aortic root and ascending aorta are structurally normal, with no evidence of dilitation. Venous: The inferior vena cava is normal in size with greater than 50% respiratory variability, suggesting right atrial  pressure of 3 mmHg. IAS/Shunts: No atrial level shunt detected by color flow Doppler.  LEFT VENTRICLE PLAX 2D LVIDd:         4.20 cm      Diastology LVIDs:         3.00 cm      LV e' medial:    10.10 cm/s LV PW:         0.70 cm      LV E/e' medial:  7.8 LV IVS:        0.80 cm      LV e' lateral:   6.74 cm/s LVOT diam:     1.90 cm      LV E/e' lateral: 11.7 LV SV:         49 LV SV Index:   29 LVOT Area:     2.84 cm  LV Volumes (MOD) LV vol d, MOD A2C: 129.0 ml LV vol d, MOD A4C: 151.0 ml LV vol s,  MOD A2C: 62.6 ml LV vol s, MOD A4C: 60.7 ml LV SV MOD A2C:     66.4 ml LV SV MOD A4C:     151.0 ml LV SV MOD BP:      80.2 ml RIGHT VENTRICLE             IVC RV Basal diam:  3.30 cm     IVC diam: 1.50 cm RV Mid diam:    2.40 cm RV S prime:     10.20 cm/s TAPSE (M-mode): 2.9 cm LEFT ATRIUM             Index        RIGHT ATRIUM           Index LA diam:        3.10 cm 1.83 cm/m   RA Area:     12.10 cm LA Vol (A2C):   67.6 ml 39.85 ml/m  RA Volume:   27.60 ml  16.27 ml/m LA Vol (A4C):   37.1 ml 21.87 ml/m LA Biplane Vol: 55.0 ml 32.42 ml/m  AORTIC VALVE                    PULMONIC VALVE AV Area (Vmax):    1.65 cm     PV Vmax:       0.91 m/s AV Area (Vmean):   1.60 cm     PV Peak grad:  3.3 mmHg AV Area (VTI):     1.94 cm AV Vmax:           158.00 cm/s AV Vmean:          95.600 cm/s AV VTI:            0.251 m AV Peak Grad:      10.0 mmHg AV Mean Grad:      4.0 mmHg LVOT Vmax:         91.70 cm/s LVOT Vmean:        54.000 cm/s LVOT VTI:          0.172 m LVOT/AV VTI ratio: 0.69  AORTA Ao Root diam: 3.10 cm Ao Asc diam:  3.30 cm MITRAL VALVE MV Area (PHT): 4.68 cm    SHUNTS MV Area VTI:   2.16 cm    Systemic VTI:  0.17 m MV Peak grad:  1.9 mmHg    Systemic Diam: 1.90 cm MV Mean grad:  1.0 mmHg MV Vmax:       0.68 m/s MV Vmean:      49.5 cm/s MV Decel Time: 162 msec MV E velocity: 78.60 cm/s MV A velocity: 82.30 cm/s MV E/A ratio:  0.96 Dorn Ross MD Electronically signed by Dorn Ross MD Signature Date/Time:  07/15/2023/11:20:41 AM    Final    US  Venous Img Lower Bilateral (DVT) Result Date: 07/15/2023 CLINICAL DATA:  Nuclear medicine study suspicious for pulmonary embolism. EXAM: BILATERAL LOWER EXTREMITY VENOUS DOPPLER ULTRASOUND TECHNIQUE: Gray-scale sonography with graded compression, as well as color Doppler and duplex ultrasound were performed to evaluate the lower extremity deep venous systems from the level of the common femoral vein and including the common femoral, femoral, profunda femoral, popliteal and calf veins including the posterior tibial, peroneal and gastrocnemius veins when visible. The superficial great saphenous vein was also interrogated. Spectral Doppler was utilized to evaluate flow at rest and with distal augmentation maneuvers in the common femoral, femoral and popliteal veins. COMPARISON:  None Available. FINDINGS: RIGHT LOWER EXTREMITY Common Femoral Vein: No evidence  of thrombus. Normal compressibility, respiratory phasicity and response to augmentation. Saphenofemoral Junction: No evidence of thrombus. Normal compressibility and flow on color Doppler imaging. Profunda Femoral Vein: No evidence of thrombus. Normal compressibility and flow on color Doppler imaging. Femoral Vein: No evidence of thrombus. Normal compressibility, respiratory phasicity and response to augmentation. Popliteal Vein: No evidence of thrombus. Normal compressibility, respiratory phasicity and response to augmentation. Calf Veins: No evidence of thrombus. Normal compressibility and flow on color Doppler imaging. Superficial Great Saphenous Vein: No evidence of thrombus. Normal compressibility. Venous Reflux:  None. Other Findings: No evidence of superficial thrombophlebitis or abnormal fluid collection. LEFT LOWER EXTREMITY Common Femoral Vein: No evidence of thrombus. Normal compressibility, respiratory phasicity and response to augmentation. Saphenofemoral Junction: No evidence of thrombus. Normal compressibility  and flow on color Doppler imaging. Profunda Femoral Vein: No evidence of thrombus. Normal compressibility and flow on color Doppler imaging. Femoral Vein: No evidence of thrombus. Normal compressibility, respiratory phasicity and response to augmentation. Popliteal Vein: No evidence of thrombus. Normal compressibility, respiratory phasicity and response to augmentation. Calf Veins: No evidence of thrombus. Normal compressibility and flow on color Doppler imaging. Superficial Great Saphenous Vein: No evidence of thrombus. Normal compressibility. Venous Reflux:  None. Other Findings: No evidence of superficial thrombophlebitis or abnormal fluid collection. IMPRESSION: No evidence of deep venous thrombosis in either lower extremity. Electronically Signed   By: Marcey Moan M.D.   On: 07/15/2023 10:52   NM Pulmonary Perfusion Result Date: 07/14/2023 CLINICAL DATA:  Several month history of shortness of breath EXAM: NUCLEAR MEDICINE PERFUSION LUNG SCAN TECHNIQUE: Perfusion images were obtained in multiple projections after intravenous injection of radiopharmaceutical. Ventilation scans intentionally deferred if perfusion scan and chest x-ray adequate for interpretation during COVID 19 epidemic. RADIOPHARMACEUTICALS:  4.4 mCi Tc-61m MAA IV COMPARISON:  Same day chest radiograph FINDINGS: Peripheral, wedge-shaped perfusion defect along the anterior right middle lobe. IMPRESSION: Pulmonary embolism present. Critical Value/emergent results were called by telephone at the time of interpretation on 07/14/2023 at 12:59 pm to provider [facility] , who verbally acknowledged these results. Electronically Signed   By: Limin  Xu M.D.   On: 07/14/2023 12:59   DG Chest Port 1 View Result Date: 07/14/2023 CLINICAL DATA:  Shortness of breath. EXAM: PORTABLE CHEST 1 VIEW COMPARISON:  06/19/2023.  11/15/2020. FINDINGS: The lungs are clear without focal pneumonia, edema, pneumothorax or pleural effusion. Tiny granuloma lateral  left lower lung stable since the 2022 exam. The cardio pericardial silhouette is enlarged. No acute bony abnormality. Telemetry leads overlie the chest. IMPRESSION: Enlargement of the cardiopericardial silhouette without acute cardiopulmonary findings. Electronically Signed   By: Camellia Candle M.D.   On: 07/14/2023 06:11   DG Foot Complete Right Result Date: 07/07/2023 Please see detailed radiograph report in office note.  DG Chest 2 View Result Date: 06/19/2023 CLINICAL DATA:  Cough for 2 weeks. EXAM: CHEST - 2 VIEW COMPARISON:  June 05, 2023. FINDINGS: The heart size and mediastinal contours are within normal limits. Both lungs are clear. The visualized skeletal structures are unremarkable. IMPRESSION: No active cardiopulmonary disease. Electronically Signed   By: Lynwood Landy Raddle M.D.   On: 06/19/2023 11:56    Today   Subjective    Abigaile Kosta today has no new complaints -Husband and mother-in-law at bedside, questions answered No fever  Or chills   No Nausea, Vomiting or Diarrhea -No chest pains no palpitations no dizziness- -post ambulation O2 sats 97% on room air   Patient has been seen and  examined prior to discharge   Objective   Blood pressure 105/70, pulse 80, temperature 98.7 F (37.1 C), temperature source Oral, resp. rate 20, height 5' 2 (1.575 m), weight 68.5 kg, SpO2 100%.   Intake/Output Summary (Last 24 hours) at 07/16/2023 1138 Last data filed at 07/16/2023 0942 Gross per 24 hour  Intake 480 ml  Output 900 ml  Net -420 ml    Exam Gen:- Awake Alert, no acute distress  HEENT:- [place].AT, No sclera icterus Ears--HOH especially on the Rt Neck-Supple Neck,No JVD,.  Lungs-  CTAB , good air movement bilaterally CV- S1, S2 normal, regular Abd-  +ve B.Sounds, Abd Soft, No tenderness,    Extremity/Skin:- No  edema,   good pulses Psych-affect is appropriate, oriented x3 Neuro-generalized weakness, no new focal deficits, no tremors    Data Review   CBC w Diff:   Lab Results  Component Value Date   WBC 4.7 07/16/2023   HGB 10.7 (L) 07/16/2023   HGB 10.8 (L) 01/28/2023   HCT 30.9 (L) 07/16/2023   HCT 32.7 (L) 01/28/2023   PLT 166 07/16/2023   PLT 138 (L) 01/28/2023   LYMPHOPCT 20 07/14/2023   MONOPCT 10 07/14/2023   EOSPCT 1 07/14/2023   BASOPCT 0 07/14/2023    CMP:  Lab Results  Component Value Date   NA 134 (L) 07/16/2023   K 4.3 07/16/2023   CL 104 07/16/2023   CO2 22 07/16/2023   BUN 21 07/16/2023   CREATININE 1.55 (H) 07/16/2023   CREATININE 1.27 (H) 02/17/2019   PROT 6.2 (L) 07/14/2023   ALBUMIN  3.6 07/14/2023   BILITOT 0.6 07/14/2023   ALKPHOS 69 07/14/2023   AST 20 07/14/2023   ALT 17 07/14/2023   Total Discharge time is about 33 minutes  Rendall Carwin M.D on 07/16/2023 at 11:38 AM  Go to [email or web address] -  for contact info  [facility] - Office  [phone]

## 2023-07-16 NOTE — Progress Notes (Signed)
 Mobility Specialist Progress Note:    07/16/23 0931  Mobility  Activity Ambulated with assistance in hallway  Level of Assistance Independent  Assistive Device None  Distance Ambulated (ft) 150 ft  Range of Motion/Exercises Active;All extremities  Activity Response Tolerated well  Mobility Referral Yes  Mobility visit 1 Mobility  Mobility Specialist Start Time (ACUTE ONLY) 0915  Mobility Specialist Stop Time (ACUTE ONLY) 0935  Mobility Specialist Time Calculation (min) (ACUTE ONLY) 20 min   Pt received in chair, agreeable to mobility. Independently able to stand and ambulate with no AD. Tolerated well, SpO2 97% on RA before and after ambulation. C/o SOB when ambulating long distances. Returned to chair, all needs met.   Sherrilee Ditty Mobility Specialist Please contact via Special Educational Needs Teacher or  Rehab office at [phone]

## 2023-07-16 NOTE — Telephone Encounter (Signed)
 noted

## 2023-07-16 NOTE — Evaluation (Signed)
 Physical Therapy Brief Evaluation and Discharge Note Patient Details Name: Jessica [name] MRN: [id number] DOB: 05/21/1951 Today's Date: 07/16/2023   History of Present Illness  73 year old female with Crohn's disease who started having recent flares and was treated with Entyvio  injections which she barely tolerated and was associated with subsequent recurrent infections requiring antibiotics, iron deficiency anemia, easy bruising, generalized anxiety disorder, stage IIIb CKD, hypothyroidism, neuropathy, GERD, chronic diarrhea, OSA, mild cognitive impairment, essential tremor, esophageal dysphagia (scheduled for EGD in 4 days) reports that she has been suffering from shortness of breath especially with exertion, exercise intolerance for past several months.  She sought medical care and was seen in ED and outpatient for several times and it was thought that she had pneumonia and she was treated with steroids and antibiotics but the dyspnea never resolved after she completed the treatment.  She also was told that she was having an anxiety attack and treated with lorazepam .  Clinical Impression  Pt tolerated evaluation well with independence for all mobility. Did show minor balance deficits which are not due to acute onset of weakness, this is likely chronic in nature due to PMH and history of falls. Tolerated 82ft total of ambulation without AD. Patient is performing at functional baseline, no safety concerns noted throughout evaluation.  Patient is safe to DC home with appropriate services, DME, and any other needs indicated.  PT to sign off at this time.  Please reconsult acute PT services if functional changes occur while admitted.  Thank you.       PT Assessment    Assistance Needed at Discharge       Equipment Recommendations None recommended by PT  Recommendations for Other Services       Precautions/Restrictions Precautions Precautions: None Restrictions Weight Bearing Restrictions Per  Provider Order: No        Mobility  Bed Mobility          Transfers Overall transfer level: Independent                 General transfer comment: power to stand from EOB without AD. Independent sit/stand in restroom with use of grab bars    Ambulation/Gait Ambulation/Gait assistance: Independent Gait Distance (Feet): 80 Feet Assistive device: None Gait Pattern/deviations: WFL(Within Functional Limits), Decreased weight shift to left   General Gait Details: 16ft initially within room and 72ft ambulated in hallway without AD, increased R lateral trunk lean during stance phase, slow and mild LOB turning.  Home Activity Instructions    Stairs            Modified Rankin (Stroke Patients Only)        Balance   Sitting-balance support: No upper extremity supported, Feet unsupported Sitting balance-Leahy Scale: Normal Sitting balance - Comments: EOB, toilet, recliner   Standing balance support: No upper extremity supported, During functional activity Standing balance-Leahy Scale: Good Standing balance comment: with increased lateral weight shift during previous surgery on RLE.          Pertinent Vitals/Pain   Pain Assessment Pain Assessment: No/denies pain     Home Living   Living Arrangements: Children       Home Equipment: Agricultural Consultant (2 wheels);Rollator (4 wheels);Wheelchair - manual;Cane - single point;BSC/3in1        Prior Function        UE/LE Assessment               Communication   Communication Communication: No apparent difficulties  Cognition         General Comments      Exercises     Assessment/Plan    PT Problem List         PT Visit Diagnosis Unsteadiness on feet (R26.81);Muscle weakness (generalized) (M62.81)    No Skilled PT Patient at baseline level of functioning   Co-evaluation                AMPAC 6 Clicks Help needed turning from your back to your side while in a flat bed  without using bedrails?: None Help needed moving from lying on your back to sitting on the side of a flat bed without using bedrails?: None Help needed moving to and from a bed to a chair (including a wheelchair)?: None Help needed standing up from a chair using your arms (e.g., wheelchair or bedside chair)?: None Help needed to walk in hospital room?: None Help needed climbing 3-5 steps with a railing? : None 6 Click Score: 24      End of Session   Activity Tolerance: No increased pain Patient left: in chair;with call bell/phone within reach Nurse Communication: Mobility status PT Visit Diagnosis: Unsteadiness on feet (R26.81);Muscle weakness (generalized) (M62.81)     Time: 9177-9165 PT Time Calculation (min) (ACUTE ONLY): 12 min  Charges:   PT Evaluation $PT Eval Low Complexity: 1 Low      Omega JONETTA Donna ALMETA, DPT [facility] [place] [phone] office  Omega JONETTA Donna  07/16/2023, 8:45 AM

## 2023-07-16 NOTE — TOC Transition Note (Signed)
 Transition of Care [facility]) - Discharge Note   Patient Details  Name: Jessica [name] MRN: [id number] Date of Birth: 1951/03/16  Transition of Care [facility]) CM/SW Contact:  Mcarthur Saddie Kim, LCSW Phone Number: 07/16/2023, 10:58 AM   Clinical Narrative: Pt agreeable to HHPT with no preference on agency. Referred and accepted by Dorothe with [facility]. MD notified for order. Dorothe aware of anticipated d/c today.       Final next level of care: Home w Home Health Services Barriers to Discharge: Barriers Resolved   Patient Goals and CMS Choice Patient states their goals for this hospitalization and ongoing recovery are:: return home   Choice offered to / list presented to : Patient [place] ownership interest in [facility].provided to::  (n/a)    Discharge Placement                       Discharge Plan and Services Additional resources added to the After Visit Summary for                            [facility] Arranged: PT [facility] Agency: [facility] Date [facility] Agency Contacted: 07/16/23 Time HH Agency Contacted: 1058 Representative spoke with at [facility] Agency: Dorothe  Social Drivers of Health (SDOH) Interventions SDOH Screenings   Food Insecurity: No Food Insecurity (07/14/2023)  Housing: Unknown (07/14/2023)  Transportation Needs: No Transportation Needs (07/14/2023)  Utilities: Not At Risk (07/14/2023)  Depression (PHQ2-9): Low Risk  (05/21/2023)  Social Connections: Socially Integrated (07/14/2023)  Tobacco Use: Low Risk  (07/14/2023)     Readmission Risk Interventions     No data to display

## 2023-07-17 ENCOUNTER — Ambulatory Visit: Payer: Medicare Other | Admitting: Nurse Practitioner

## 2023-07-17 ENCOUNTER — Ambulatory Visit: Payer: Medicare Other | Admitting: Gastroenterology

## 2023-07-17 DIAGNOSIS — K219 Gastro-esophageal reflux disease without esophagitis: Secondary | ICD-10-CM | POA: Diagnosis not present

## 2023-07-17 DIAGNOSIS — G3184 Mild cognitive impairment, so stated: Secondary | ICD-10-CM | POA: Diagnosis not present

## 2023-07-17 DIAGNOSIS — J189 Pneumonia, unspecified organism: Secondary | ICD-10-CM | POA: Diagnosis not present

## 2023-07-17 DIAGNOSIS — R1314 Dysphagia, pharyngoesophageal phase: Secondary | ICD-10-CM | POA: Diagnosis not present

## 2023-07-17 DIAGNOSIS — E440 Moderate protein-calorie malnutrition: Secondary | ICD-10-CM | POA: Diagnosis not present

## 2023-07-17 DIAGNOSIS — D509 Iron deficiency anemia, unspecified: Secondary | ICD-10-CM | POA: Diagnosis not present

## 2023-07-17 DIAGNOSIS — I2699 Other pulmonary embolism without acute cor pulmonale: Secondary | ICD-10-CM | POA: Diagnosis not present

## 2023-07-17 DIAGNOSIS — G609 Hereditary and idiopathic neuropathy, unspecified: Secondary | ICD-10-CM | POA: Diagnosis not present

## 2023-07-17 DIAGNOSIS — K591 Functional diarrhea: Secondary | ICD-10-CM | POA: Diagnosis not present

## 2023-07-17 DIAGNOSIS — Z299 Encounter for prophylactic measures, unspecified: Secondary | ICD-10-CM | POA: Diagnosis not present

## 2023-07-17 DIAGNOSIS — N1831 Chronic kidney disease, stage 3a: Secondary | ICD-10-CM | POA: Diagnosis not present

## 2023-07-17 DIAGNOSIS — Z7901 Long term (current) use of anticoagulants: Secondary | ICD-10-CM | POA: Diagnosis not present

## 2023-07-17 DIAGNOSIS — Z79891 Long term (current) use of opiate analgesic: Secondary | ICD-10-CM | POA: Diagnosis not present

## 2023-07-17 DIAGNOSIS — R52 Pain, unspecified: Secondary | ICD-10-CM | POA: Diagnosis not present

## 2023-07-17 DIAGNOSIS — F419 Anxiety disorder, unspecified: Secondary | ICD-10-CM | POA: Diagnosis not present

## 2023-07-17 DIAGNOSIS — G4733 Obstructive sleep apnea (adult) (pediatric): Secondary | ICD-10-CM | POA: Diagnosis not present

## 2023-07-17 DIAGNOSIS — K5090 Crohn's disease, unspecified, without complications: Secondary | ICD-10-CM | POA: Diagnosis not present

## 2023-07-17 DIAGNOSIS — E871 Hypo-osmolality and hyponatremia: Secondary | ICD-10-CM | POA: Diagnosis not present

## 2023-07-17 DIAGNOSIS — Z9989 Dependence on other enabling machines and devices: Secondary | ICD-10-CM | POA: Diagnosis not present

## 2023-07-17 DIAGNOSIS — K5000 Crohn's disease of small intestine without complications: Secondary | ICD-10-CM | POA: Diagnosis not present

## 2023-07-17 DIAGNOSIS — E039 Hypothyroidism, unspecified: Secondary | ICD-10-CM | POA: Diagnosis not present

## 2023-07-17 DIAGNOSIS — I10 Essential (primary) hypertension: Secondary | ICD-10-CM | POA: Diagnosis not present

## 2023-07-17 DIAGNOSIS — N1832 Chronic kidney disease, stage 3b: Secondary | ICD-10-CM | POA: Diagnosis not present

## 2023-07-18 ENCOUNTER — Encounter (HOSPITAL_COMMUNITY): Payer: Self-pay

## 2023-07-18 ENCOUNTER — Ambulatory Visit (HOSPITAL_COMMUNITY): Admit: 2023-07-18 | Payer: Medicare Other | Admitting: Gastroenterology

## 2023-07-18 SURGERY — ESOPHAGOGASTRODUODENOSCOPY (EGD) WITH PROPOFOL
Anesthesia: Monitor Anesthesia Care

## 2023-07-21 ENCOUNTER — Inpatient Hospital Stay: Payer: Medicare Other

## 2023-07-21 DIAGNOSIS — R059 Cough, unspecified: Secondary | ICD-10-CM | POA: Diagnosis not present

## 2023-07-21 DIAGNOSIS — M810 Age-related osteoporosis without current pathological fracture: Secondary | ICD-10-CM | POA: Diagnosis not present

## 2023-07-21 DIAGNOSIS — Z299 Encounter for prophylactic measures, unspecified: Secondary | ICD-10-CM | POA: Diagnosis not present

## 2023-07-21 DIAGNOSIS — I2699 Other pulmonary embolism without acute cor pulmonale: Secondary | ICD-10-CM | POA: Diagnosis not present

## 2023-07-21 DIAGNOSIS — I10 Essential (primary) hypertension: Secondary | ICD-10-CM | POA: Diagnosis not present

## 2023-07-22 ENCOUNTER — Ambulatory Visit (INDEPENDENT_AMBULATORY_CARE_PROVIDER_SITE_OTHER): Payer: Medicare Other | Admitting: Pulmonary Disease

## 2023-07-22 ENCOUNTER — Encounter (HOSPITAL_BASED_OUTPATIENT_CLINIC_OR_DEPARTMENT_OTHER): Payer: Self-pay | Admitting: Pulmonary Disease

## 2023-07-22 ENCOUNTER — Encounter: Payer: Self-pay | Admitting: Hematology

## 2023-07-22 ENCOUNTER — Encounter: Payer: Self-pay | Admitting: Gastroenterology

## 2023-07-22 VITALS — BP 110/62 | HR 80 | Resp 18 | Ht 62.0 in | Wt 149.6 lb

## 2023-07-22 DIAGNOSIS — R053 Chronic cough: Secondary | ICD-10-CM

## 2023-07-22 DIAGNOSIS — G4730 Sleep apnea, unspecified: Secondary | ICD-10-CM

## 2023-07-22 DIAGNOSIS — I2699 Other pulmonary embolism without acute cor pulmonale: Secondary | ICD-10-CM

## 2023-07-22 MED ORDER — PREDNISONE 10 MG PO TABS: ORAL_TABLET | ORAL | 0 refills | Status: DC

## 2023-07-22 MED ORDER — AIRSUPRA 90-80 MCG/ACT IN AERO: 2.0000 | INHALATION_SPRAY | Freq: Three times a day (TID) | RESPIRATORY_TRACT | Status: AC

## 2023-07-22 NOTE — Progress Notes (Signed)
 Subjective:    Patient ID: Jessica [name], female    DOB: 1951/01/24, 73 y.o.   MRN: [id number]  HPI  73 yo for follow-up of OSA and dyspnea   PMH -Crohn's disease, hypertension, hyperlipidemia, CKD stage III, moderate MR Suspected early cirrhosis  -right hemicolectomy with appendectomy  -schwannoma RT ear -easy bruising >>mild thrombocytopenia and iron deficiency    She did not tolerate CPAP well and after a titration study in April 2024, we switched her to BiPAP. On her last visit 03/2023 we noted treatment emergent central apneas 05/2023 she has not been using her BiPAP due to a Crohns flare up    11/28 ED visit >> suspected allergic reaction to an infusion of Entivyo, receiving her second infusion of this medication on 11/13  She has a negative troponin, she had progressive symptoms for almost 2 weeks, elevation in her creatinine at 2.09. Over the past 3 months her creatinine has been in the range of 1.38-1.56, suggesting significant dehydration. Her respiratory panel is negative  Chest x-ray with prominent interstitial opacities  given an IV dose of Decadron , started on doxycycline  to cover her for possible atypical pneumonia. She will also be prescribed a short pulse dose of prednisone  for 4 days   Chief Complaint  Patient presents with   Follow-up    FU Blood clot in lungs- OSA- Not good, has a dry cough- has it all day every day. She hasn't been able to wear CPAP for a couple months. She had pneumonia, sinus infection and now has a cough.    Admitted 07/14/23 with severe anxiety and shortness of breath  VQ showed Peripheral, wedge-shaped perfusion defect along the anterior right middle lobe. -Lower extremity Dopplers without DVT -Echo with EF of 55 to 60%, without right heart strain,  Discussed the use of AI scribe software for clinical note transcription with the patient, who gave verbal consent to proceed.  History of Present Illness   The patient, with a history of  sleep apnea, Crohn's disease, and a recent pulmonary embolism, presents with a persistent cough that has been ongoing for over a month. She reports that the cough is dry and non-productive, and it has not responded to over-the-counter cough syrups or a prescribed inhaler. The cough is severe enough that it has interfered with her ability to use her CPAP machine for sleep apnea. She also reports a runny nose.  The patient was recently hospitalized multiple times and was diagnosed with a pulmonary embolism. She was prescribed Eliquis  for the blood clot. She also has Crohn's disease and was on Entyvio , an immunosuppressive medication, but had to stop due to adverse reactions. She reports that she has been off the medication since September. Despite stopping the medication, she continues to have diarrhea, a common symptom of Crohn's disease.  The patient also mentions a recent trip to [place]  where she felt very ill, with severe coughing and a runny nose. She expresses concern that her immune system is compromised due to the Entyvio , making her more susceptible to infections.        Significant tests/ events reviewed   10/2022 CPAP titration >> BiPAP 25/21 01/2021 HST showed mod  OSA with AHI 28/ hr 03/2021 TEE mod MR  Review of Systems neg for any significant sore throat, dysphagia, itching, sneezing, nasal congestion or excess/ purulent secretions, fever, chills, sweats, unintended wt loss, pleuritic or exertional cp, hempoptysis, orthopnea pnd or change in chronic leg swelling. Also denies presyncope, palpitations, heartburn,  abdominal pain, nausea, vomiting, diarrhea or change in bowel or urinary habits, dysuria,hematuria, rash, arthralgias, visual complaints, headache, numbness weakness or ataxia.     Objective:   Physical Exam   Gen. Pleasant, obese, in no distress ENT - no lesions, no post nasal drip Neck: No JVD, no thyromegaly, no carotid bruits Lungs: no use of accessory muscles, no  dullness to percussion, decreased without rales scattered  rhonchi  Cardiovascular: Rhythm regular, heart sounds  normal, no murmurs or gallops, no peripheral edema Musculoskeletal: No deformities, no cyanosis or clubbing , no tremors      Assessment & Plan:    Assessment and Plan    Chronic Cough Persistent dry cough for over a month, unresponsive to OTC medications and previous prednisone  treatment. No productive cough. Recent x-ray negative for pneumonia. Possible causes include post-infectious cough, medication side effect (Entyvio  for Crohn's), or other pulmonary pathology. Discussed risks of continued cough and potential respiratory complications. Prednisone  may provide symptomatic relief but could lower immune response. Need for chest CT scan to rule out other pulmonary issues. - Prescribe prednisone  - Order chest CT scan in [place] - Provide sample inhaler - Air supra- for symptomatic relief  Pulmonary Embolism Diagnosed with pulmonary embolism in January. Currently on Eliquis . Persistent dyspnea noted. Emphasized importance of continuing anticoagulation therapy to prevent further embolic events. Discussed risks of Eliquis , including bleeding. - Continue Eliquis   Sleep Apnea Unable to use CPAP due to persistent cough. Advised to resume CPAP once cough subsides. Discussed importance of CPAP in managing sleep apnea and potential cardiovascular risks of untreated sleep apnea. - Resume CPAP use once cough subsides  Crohn's Disease Currently experiencing diarrhea, no abdominal pain. Previously on Entyvio , discontinued due to adverse reactions. Scheduled for iron infusion and awaiting new treatment plan from GI specialist. Discussed that new treatment may suppress immune system, increasing infection risk. - Continue with scheduled iron infusion - Follow up with GI specialist for new treatment plan  General Health Maintenance Advised to wear a mask in public due to  immunosuppression from previous Entyvio  treatment. Discussed importance of preventing infections given compromised immune system. - Wear mask in public  Follow-up - Schedule follow-up appointment within one month.

## 2023-07-22 NOTE — Patient Instructions (Signed)
 X Trial of air supra - 2 puffs every 8h as needed  X CT chest wo con  X Prednisone 10 mg tabs  Take 2 tabs daily with food x 10ds, then 1 tab daily with food x 10ds then STOP

## 2023-07-23 ENCOUNTER — Inpatient Hospital Stay: Payer: Medicare Other

## 2023-07-23 VITALS — BP 96/66 | HR 70 | Temp 97.4°F | Resp 18

## 2023-07-23 DIAGNOSIS — D509 Iron deficiency anemia, unspecified: Secondary | ICD-10-CM

## 2023-07-23 DIAGNOSIS — F32A Depression, unspecified: Secondary | ICD-10-CM | POA: Diagnosis not present

## 2023-07-23 DIAGNOSIS — K648 Other hemorrhoids: Secondary | ICD-10-CM | POA: Diagnosis not present

## 2023-07-23 DIAGNOSIS — R0602 Shortness of breath: Secondary | ICD-10-CM | POA: Diagnosis not present

## 2023-07-23 DIAGNOSIS — K5080 Crohn's disease of both small and large intestine without complications: Secondary | ICD-10-CM | POA: Diagnosis not present

## 2023-07-23 DIAGNOSIS — R5383 Other fatigue: Secondary | ICD-10-CM | POA: Diagnosis not present

## 2023-07-23 DIAGNOSIS — D649 Anemia, unspecified: Secondary | ICD-10-CM | POA: Diagnosis not present

## 2023-07-23 MED ORDER — SODIUM CHLORIDE 0.9 % IV SOLN
1000.0000 mg | Freq: Once | INTRAVENOUS | Status: AC
  Administered 2023-07-23: 1000 mg via INTRAVENOUS
  Filled 2023-07-23: qty 1000

## 2023-07-23 MED ORDER — CETIRIZINE HCL 10 MG/ML IV SOLN
10.0000 mg | Freq: Once | INTRAVENOUS | Status: AC
  Administered 2023-07-23: 10 mg via INTRAVENOUS
  Filled 2023-07-23: qty 1

## 2023-07-23 MED ORDER — ACETAMINOPHEN 325 MG PO TABS
650.0000 mg | ORAL_TABLET | Freq: Once | ORAL | Status: AC
Start: 2023-07-23 — End: 2023-07-23
  Administered 2023-07-23: 650 mg via ORAL
  Filled 2023-07-23: qty 2

## 2023-07-23 MED ORDER — SODIUM CHLORIDE 0.9 % IV SOLN: Freq: Once | INTRAVENOUS | Status: AC

## 2023-07-23 NOTE — Progress Notes (Signed)
 Patient presents today for Monoferric  infusion per providers order.  Vital signs WNL.  Patient has no new complaints at this time.    Peripheral IV started and blood return noted pre and post infusion.  Treatment given today per MD orders.  Stable during infusion without adverse affects.  Vital signs stable.  No complaints at this time.  Discharge from clinic ambulatory in stable condition.  Alert and oriented X 3.  Follow up with [facility] as scheduled.

## 2023-07-23 NOTE — Patient Instructions (Signed)
 [facility] [place] - A DEPT OF [facility]  Discharge Instructions: Thank you for choosing [facility] to provide your oncology and hematology care.  If you have a lab appointment with the Cancer Center - please note that after April 8th, 2024, all labs will be drawn in the cancer center.  You do not have to check in or register with the main entrance as you have in the past but will complete your check-in in the cancer center.  Wear comfortable clothing and clothing appropriate for easy access to any Portacath or PICC line.   We strive to give you quality time with your provider. You may need to reschedule your appointment if you arrive late (15 or more minutes).  Arriving late affects you and other patients whose appointments are after yours.  Also, if you miss three or more appointments without notifying the office, you may be dismissed from the clinic at the provider's discretion.      For prescription refill requests, have your pharmacy contact our office and allow 72 hours for refills to be completed.    Today you received the following chemotherapy and/or immunotherapy agents Monoferric      To help prevent nausea and vomiting after your treatment, we encourage you to take your nausea medication as directed.  BELOW ARE SYMPTOMS THAT SHOULD BE REPORTED IMMEDIATELY: *FEVER GREATER THAN 100.4 F (38 C) OR HIGHER *CHILLS OR SWEATING *NAUSEA AND VOMITING THAT IS NOT CONTROLLED WITH YOUR NAUSEA MEDICATION *UNUSUAL SHORTNESS OF BREATH *UNUSUAL BRUISING OR BLEEDING *URINARY PROBLEMS (pain or burning when urinating, or frequent urination) *BOWEL PROBLEMS (unusual diarrhea, constipation, pain near the anus) TENDERNESS IN MOUTH AND THROAT WITH OR WITHOUT PRESENCE OF ULCERS (sore throat, sores in mouth, or a toothache) UNUSUAL RASH, SWELLING OR PAIN  UNUSUAL VAGINAL DISCHARGE OR ITCHING   Items with * indicate a potential emergency and should be followed  up as soon as possible or go to the Emergency Department if any problems should occur.  Please show the CHEMOTHERAPY ALERT CARD or IMMUNOTHERAPY ALERT CARD at check-in to the Emergency Department and triage nurse.  Should you have questions after your visit or need to cancel or reschedule your appointment, please contact [facility] [place] - A DEPT OF Eligha [facility] [phone]  and follow the prompts.  Office hours are 8:00 a.m. to 4:30 p.m. Monday - Friday. Please note that voicemails left after 4:00 p.m. may not be returned until the following business day.  We are closed weekends and major holidays. You have access to a nurse at all times for urgent questions. Please call the main number to the clinic [phone] and follow the prompts.  For any non-urgent questions, you may also contact your provider using MyChart. We now offer e-Visits for anyone 54 and older to request care online for non-urgent symptoms. For details visit [email or web address].   Also download the MyChart app! Go to the app store, search "MyChart", open the app, select [place], and log in with your MyChart username and password.

## 2023-07-24 DIAGNOSIS — I2699 Other pulmonary embolism without acute cor pulmonale: Secondary | ICD-10-CM | POA: Diagnosis not present

## 2023-07-24 DIAGNOSIS — N1832 Chronic kidney disease, stage 3b: Secondary | ICD-10-CM | POA: Diagnosis not present

## 2023-07-24 DIAGNOSIS — G609 Hereditary and idiopathic neuropathy, unspecified: Secondary | ICD-10-CM | POA: Diagnosis not present

## 2023-07-24 DIAGNOSIS — E440 Moderate protein-calorie malnutrition: Secondary | ICD-10-CM | POA: Diagnosis not present

## 2023-07-24 DIAGNOSIS — E871 Hypo-osmolality and hyponatremia: Secondary | ICD-10-CM | POA: Diagnosis not present

## 2023-07-24 DIAGNOSIS — D509 Iron deficiency anemia, unspecified: Secondary | ICD-10-CM | POA: Diagnosis not present

## 2023-07-24 DIAGNOSIS — E781 Pure hyperglyceridemia: Secondary | ICD-10-CM | POA: Diagnosis not present

## 2023-07-25 DIAGNOSIS — E871 Hypo-osmolality and hyponatremia: Secondary | ICD-10-CM | POA: Diagnosis not present

## 2023-07-25 DIAGNOSIS — N1832 Chronic kidney disease, stage 3b: Secondary | ICD-10-CM | POA: Diagnosis not present

## 2023-07-25 DIAGNOSIS — I2699 Other pulmonary embolism without acute cor pulmonale: Secondary | ICD-10-CM | POA: Diagnosis not present

## 2023-07-25 DIAGNOSIS — G609 Hereditary and idiopathic neuropathy, unspecified: Secondary | ICD-10-CM | POA: Diagnosis not present

## 2023-07-25 DIAGNOSIS — D509 Iron deficiency anemia, unspecified: Secondary | ICD-10-CM | POA: Diagnosis not present

## 2023-07-25 DIAGNOSIS — E440 Moderate protein-calorie malnutrition: Secondary | ICD-10-CM | POA: Diagnosis not present

## 2023-07-28 DIAGNOSIS — D509 Iron deficiency anemia, unspecified: Secondary | ICD-10-CM | POA: Diagnosis not present

## 2023-07-28 DIAGNOSIS — G609 Hereditary and idiopathic neuropathy, unspecified: Secondary | ICD-10-CM | POA: Diagnosis not present

## 2023-07-28 DIAGNOSIS — E440 Moderate protein-calorie malnutrition: Secondary | ICD-10-CM | POA: Diagnosis not present

## 2023-07-28 DIAGNOSIS — I959 Hypotension, unspecified: Secondary | ICD-10-CM | POA: Diagnosis not present

## 2023-07-28 DIAGNOSIS — I129 Hypertensive chronic kidney disease with stage 1 through stage 4 chronic kidney disease, or unspecified chronic kidney disease: Secondary | ICD-10-CM | POA: Diagnosis not present

## 2023-07-28 DIAGNOSIS — N189 Chronic kidney disease, unspecified: Secondary | ICD-10-CM | POA: Diagnosis not present

## 2023-07-28 DIAGNOSIS — I2699 Other pulmonary embolism without acute cor pulmonale: Secondary | ICD-10-CM | POA: Diagnosis not present

## 2023-07-28 DIAGNOSIS — N2581 Secondary hyperparathyroidism of renal origin: Secondary | ICD-10-CM | POA: Diagnosis not present

## 2023-07-28 DIAGNOSIS — D638 Anemia in other chronic diseases classified elsewhere: Secondary | ICD-10-CM | POA: Diagnosis not present

## 2023-07-28 DIAGNOSIS — N1832 Chronic kidney disease, stage 3b: Secondary | ICD-10-CM | POA: Diagnosis not present

## 2023-07-28 DIAGNOSIS — E871 Hypo-osmolality and hyponatremia: Secondary | ICD-10-CM | POA: Diagnosis not present

## 2023-07-31 DIAGNOSIS — N1831 Chronic kidney disease, stage 3a: Secondary | ICD-10-CM | POA: Diagnosis not present

## 2023-07-31 DIAGNOSIS — I10 Essential (primary) hypertension: Secondary | ICD-10-CM | POA: Diagnosis not present

## 2023-07-31 DIAGNOSIS — I2699 Other pulmonary embolism without acute cor pulmonale: Secondary | ICD-10-CM | POA: Diagnosis not present

## 2023-07-31 DIAGNOSIS — Z299 Encounter for prophylactic measures, unspecified: Secondary | ICD-10-CM | POA: Diagnosis not present

## 2023-07-31 DIAGNOSIS — F339 Major depressive disorder, recurrent, unspecified: Secondary | ICD-10-CM | POA: Diagnosis not present

## 2023-08-02 DIAGNOSIS — D638 Anemia in other chronic diseases classified elsewhere: Secondary | ICD-10-CM | POA: Diagnosis not present

## 2023-08-02 DIAGNOSIS — I2699 Other pulmonary embolism without acute cor pulmonale: Secondary | ICD-10-CM | POA: Diagnosis not present

## 2023-08-02 DIAGNOSIS — N1832 Chronic kidney disease, stage 3b: Secondary | ICD-10-CM | POA: Diagnosis not present

## 2023-08-02 DIAGNOSIS — I129 Hypertensive chronic kidney disease with stage 1 through stage 4 chronic kidney disease, or unspecified chronic kidney disease: Secondary | ICD-10-CM | POA: Diagnosis not present

## 2023-08-05 DIAGNOSIS — E440 Moderate protein-calorie malnutrition: Secondary | ICD-10-CM | POA: Diagnosis not present

## 2023-08-05 DIAGNOSIS — D692 Other nonthrombocytopenic purpura: Secondary | ICD-10-CM | POA: Diagnosis not present

## 2023-08-05 DIAGNOSIS — G609 Hereditary and idiopathic neuropathy, unspecified: Secondary | ICD-10-CM | POA: Diagnosis not present

## 2023-08-05 DIAGNOSIS — R600 Localized edema: Secondary | ICD-10-CM | POA: Diagnosis not present

## 2023-08-05 DIAGNOSIS — I6381 Other cerebral infarction due to occlusion or stenosis of small artery: Secondary | ICD-10-CM | POA: Diagnosis not present

## 2023-08-05 DIAGNOSIS — E871 Hypo-osmolality and hyponatremia: Secondary | ICD-10-CM | POA: Diagnosis not present

## 2023-08-05 DIAGNOSIS — I2699 Other pulmonary embolism without acute cor pulmonale: Secondary | ICD-10-CM | POA: Diagnosis not present

## 2023-08-05 DIAGNOSIS — M461 Sacroiliitis, not elsewhere classified: Secondary | ICD-10-CM | POA: Diagnosis not present

## 2023-08-05 DIAGNOSIS — I10 Essential (primary) hypertension: Secondary | ICD-10-CM | POA: Diagnosis not present

## 2023-08-05 DIAGNOSIS — N1832 Chronic kidney disease, stage 3b: Secondary | ICD-10-CM | POA: Diagnosis not present

## 2023-08-05 DIAGNOSIS — D509 Iron deficiency anemia, unspecified: Secondary | ICD-10-CM | POA: Diagnosis not present

## 2023-08-05 DIAGNOSIS — Z299 Encounter for prophylactic measures, unspecified: Secondary | ICD-10-CM | POA: Diagnosis not present

## 2023-08-06 ENCOUNTER — Other Ambulatory Visit: Payer: Self-pay | Admitting: Gastroenterology

## 2023-08-06 ENCOUNTER — Telehealth: Payer: Self-pay | Admitting: Gastroenterology

## 2023-08-06 DIAGNOSIS — I10 Essential (primary) hypertension: Secondary | ICD-10-CM | POA: Diagnosis not present

## 2023-08-06 NOTE — Telephone Encounter (Signed)
Jessica [name] - Please reach out to patient to schedule an office visit with Dr. Levon Hedger or myself within the next few weeks to discuss her treatment options for Crohn's disease.

## 2023-08-12 ENCOUNTER — Ambulatory Visit: Payer: Medicare Other | Admitting: Gastroenterology

## 2023-08-12 DIAGNOSIS — R809 Proteinuria, unspecified: Secondary | ICD-10-CM | POA: Diagnosis not present

## 2023-08-12 DIAGNOSIS — E211 Secondary hyperparathyroidism, not elsewhere classified: Secondary | ICD-10-CM | POA: Diagnosis not present

## 2023-08-12 DIAGNOSIS — D631 Anemia in chronic kidney disease: Secondary | ICD-10-CM | POA: Diagnosis not present

## 2023-08-12 DIAGNOSIS — N189 Chronic kidney disease, unspecified: Secondary | ICD-10-CM | POA: Diagnosis not present

## 2023-08-12 NOTE — Progress Notes (Addendum)
 GI Office Note    Referring Provider: [name], MD Primary Care Physician:  [name], MD Primary Gastroenterologist: Toribio Rubins, MD  Date:  08/14/2023  ID:  Jessica [name], DOB [birth year], MRN [id number]   Chief Complaint   Chief Complaint  Patient presents with   Follow-up    Follow up. Need to know about treatment of Crohn's    History of Present Illness  Jessica [name] is a 73 y.o. female with a history of small bowel and colonic Crohn's with right hemicolectomy (previously on Lialda ), history of pinhole fistula in 2019, HTN, chronic GERD, hypothyroidism, CKD stage III, and OSA presenting today to discuss options for treatment of Chron's in light of her recent health complications.  Unable to tolerate Entyvio  secondary to extreme joint pain.  Colonoscopy 06/08/2018: - The examined portion of the ileum was normal.  - Mucosal ulceration. Biopsied.  - Colonic fistula.  - Internal hemorrhoids. -pathology: small reactive changes but no active disease -Repeat colonoscopy in 5 years.  -Patient requested to not go any therapy unless she relapses.   Office visit 01/30/21.  Doing well.  Heartburn controlled pantoprazole .  Willing to drop her dose in the future due to risk of osteopenia.  Unsure when due for next bone density.  Denied any dysphagia, hoarseness, sore throat, chronic cough.  Has occasional diarrhea and accidents but mostly she has 2 formed stools.  Stop dicyclomine .  Denied any melena or bright BPR.  Appetite good.    Last office visit 12/26/2022.  Some incontinence of urine and feces.  PCP recommended she be seen due to anemia.  Taking oral iron supplement daily.  Denied melena or BRBPR.  Stools normal though she is not having 5 to 6/day and usually moderate amount.  Taking Imodium  once daily and Bentyl  1-2 times daily.  Gets constipated sometimes with taking iron.  Due to see Dr. Rachele for her kidneys.  Still having fatigue.  Some mild dysphagia with  food being stuck in her mid chest region, especially bread.  Using CPAP at night   Colonoscopy 04/01/23: -Crohn's disease with ileitis, congestion/edema, erosions, erythema found in the neoterminal ileum Rtugeerts Score i1 -Associated narrowing of the lumen s/p biopsy -Patent end-to-side colocolonic anastomosis with healthy-appearing mucosa -Three 4-12 mm polyps in the transverse colon -Nonbleeding internal hemorrhoids -Advised to check TB and hepatitis B surface antigen in anticipation of treatment with Stelara /Entyvio /Skyrizi  -Pathology with ileal mucosa with focal erosion and active neutrophilic inflammation (NSAID versus Crohn's).  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     EGD 04/01/23: -LA grade C esophagitis with no bleeding s/p biopsy -KOH brushings obtained -4cm hiatal hernia -Normal stomach -Normal duodenum -GE junction biopsy with inflamed squamous mucosa with features consistent with severe reflux.  No yeast   Hep B surface antigen -neg. 04/01/2023.  TB Gold negative  Last office visit 04/15/23.  Still having some fatigue related to anemia.  Had an episode of emesis after eating Mexican food, also with spicy.  She is taking her pantoprazole  twice daily.  Having some mild dysphagia with solid food still.  Having bowel movements after every meal, usually 3/day.  Taking Imodium  and dicyclomine  which helps with her abdominal pain.  Does have associated arthritis.  Admits to frequently using and some vision issues.  Up-to-date on COVID-vaccine and boosters as well as flu shot.  Shingles vaccine in October 2020.  Stelara  and Skyrizi  were sent in as options for treatment of Crohn's however  given large co-pay for both of these, there was discussion about Entyvio  versus Remicade, given the higher risk of malignancy with Remicade, a shared decision was made with the patient to proceed with Entyvio .  After receiving a couple doses of Entyvio  patient developed severe joint  pain therefore this was discontinued.  Since her last visit she has had 2 visits to [facility] with 1 admission.  Had an ED visit for pneumonia as well as a hospitalization secondary to pulmonary embolism.   EGD was scheduled for 07/18/2023, this was canceled and need to ensure healing/management of PE prior to rescheduling EGD.  Seen by pulmonology 07/22/2023.  Having chronic cough, chest CT ordered.  Advised to continue Eliquis  for PE. Will follow up in 1 month.   Received Monoferric  infusion 07/23/2023.    Today:  Anemia - Still has some fatigue. Having joint pain at baseline. Posibile some muscle pain. NO emlene or brbrp.      Latest Ref Rng & Units 07/16/2023    4:24 AM 07/15/2023    4:23 AM 07/14/2023    9:14 AM 07/14/2023    6:15 AM 07/08/2023   10:14 AM 06/05/2023    2:27 PM  CBC  WBC 4.0 - 10.5 K/uL 4.7  5.3   5.8  5.0  3.9   Hemoglobin 12.0 - 15.0 g/dL 89.2  89.7  89.7  9.8  11.0  11.5   Hematocrit 36.0 - 46.0 % 30.9  29.6  30.0  27.6  31.9  32.9   Platelets 150 - 400 K/uL 166  179   180  154  140    Iron/TIBC/Ferritin/ %Sat    Component Value Date/Time   IRON 133 07/08/2023 1013   TIBC 287 07/08/2023 1013   FERRITIN 337 (H) 07/08/2023 1013   IRONPCTSAT 46 (H) 07/08/2023 1013     Chron's, diarrhea - Only having 1 BM daily . Has not been taking imodium . Has been more constipated recently - just before the hospitalization. Having to strain quite a bit. Not too much abdominal pain. After the second dose abut 3-4 weeks afterwards. Took some prednisone  for a little while recently.   GERD, dysphagia - Had some reflux issues last night but not having frequent issues. Tries to take pantoprazole  - every now and then may miss a dose.   PE - Has been on her Eliquis . Still have DOE, not much with sitting. Having issues with cost.   Last flu shot: September 2024 Last pneumonia shot: Unsure timing, has had it since age 86. Last zoster vaccine: October 2020 Last DEXA scan: May 2022 COVID-19  shot: as had all of them (Moderna in 2021/2022) COVID booster september 2024   Has plans to sign up for new medicare    Wt Readings from Last 3 Encounters:  08/14/23 156 lb (70.8 kg)  07/22/23 149 lb 9.6 oz (67.9 kg)  07/14/23 151 lb (68.5 kg)    Current Outpatient Medications  Medication Sig Dispense Refill   amLODipine  (NORVASC ) 10 MG tablet Take 0.5 tablets (5 mg total) by mouth daily.     apixaban  (ELIQUIS ) 5 MG TABS tablet Take 1 tablet (5 mg total) by mouth 2 (two) times daily. 60 tablet 4   Apixaban  Starter Pack, 10mg  and 5mg , (ELIQUIS  DVT/PE STARTER PACK) Take as directed on package: start with two-5mg  tablets twice daily for 7 days. On day 8, switch to one-5mg  tablet twice daily. 1 each 0   buPROPion  (WELLBUTRIN  XL) 150 MG 24 hr tablet TAKE 1  TABLET BY MOUTH ONCE DAILY IN THE MORNING 30 tablet 0   dicyclomine  (BENTYL ) 10 MG capsule TAKE 1 CAPSULE BY MOUTH TWICE DAILY BEFORE A MEAL 60 capsule 0   ferrous sulfate  325 (65 FE) MG EC tablet Take 1 tablet (325 mg total) by mouth daily with breakfast. 90 tablet 3   gabapentin  (NEURONTIN ) 300 MG capsule TAKE 1 CAPSULE BY MOUTH THREE TIMES DAILY 90 capsule 0   HYDROcodone -acetaminophen  (NORCO/VICODIN) 5-325 MG tablet Take 1 tablet by mouth every 6 (six) hours as needed for up to 20 doses for moderate pain (pain score 4-6). 20 tablet 0   levothyroxine  (SYNTHROID ) 100 MCG tablet Take 100 mcg by mouth daily.     loperamide  (IMODIUM ) 2 MG capsule TAKE 1 CAPSULE BY MOUTH TWICE DAILY AS NEEDED FOR LOOSE STOOLS 180 capsule 1   metoprolol  succinate (TOPROL -XL) 25 MG 24 hr tablet Take 1.5 tablets (37.5 mg total) by mouth daily. TAKE 1 & 1/2 (ONE & ONE-HALF) TABLETS BY MOUTH ONCE DAILY 45 tablet 3   Multiple Vitamin (MULTIVITAMIN WITH MINERALS) TABS tablet Take 1 tablet by mouth daily.     pantoprazole  (PROTONIX ) 40 MG tablet Take 1 tablet (40 mg total) by mouth 2 (two) times daily. 180 tablet 3   predniSONE  (DELTASONE ) 10 MG tablet Take 2 tabs daily  with food x 10ds, then 1 tab daily with food x 10ds then STOP 40 tablet 0   rOPINIRole  (REQUIP ) 1 MG tablet Take 1 mg by mouth at bedtime.     rosuvastatin  (CRESTOR ) 10 MG tablet Take 1 tablet (10 mg total) by mouth daily. 90 tablet 2   spironolactone (ALDACTONE) 50 MG tablet Take 50 mg by mouth daily.     acetaminophen  (TYLENOL ) 325 MG tablet Take 2 tablets (650 mg total) by mouth every 6 (six) hours as needed for mild pain (pain score 1-3), fever or headache (or Fever >/= 101).     Albuterol -Budesonide (AIRSUPRA ) 90-80 MCG/ACT AERO Inhale 2 puffs into the lungs every 8 (eight) hours.     No current facility-administered medications for this visit.    Past Medical History:  Diagnosis Date   Anxiety attack    Arthritis    Back pain, chronic    Chronic kidney disease    Crohn's colitis (HCC)    Depression (emotion)    Dysrhythmia    GERD (gastroesophageal reflux disease)    Hypertension    Hypothyroid    Iron deficiency anemia, unspecified    Neuropathy    Sleep apnea    cpap is not working per patient - needs to find a new doctor - not used since 7/16 per pat    Stage 3 chronic kidney disease (HCC)    Vertigo    followed by Dr Kievie- in [place]     Past Surgical History:  Procedure Laterality Date   ABDOMINAL HYSTERECTOMY     BIOPSY  06/08/2018   Procedure: BIOPSY;  Surgeon: Golda Claudis PENNER, MD;  [facility];  Service: Endoscopy;;  (colon)   BIOPSY  04/01/2023   Procedure: BIOPSY;  Surgeon: Eartha Angelia Sieving, MD;  [facility];  Service: Gastroenterology;;   CHOLECYSTECTOMY     COLON SURGERY     for Crohn's in the 80s in [place]    COLONOSCOPY N/A 03/11/2013   Procedure: COLONOSCOPY;  Surgeon: Claudis PENNER Golda, MD;  [facility];  Service: Endoscopy;  Laterality: N/A;  1200   COLONOSCOPY N/A 06/08/2018   Procedure: COLONOSCOPY;  Surgeon: Golda Claudis PENNER, MD;  [facility];  Service: Endoscopy;  Laterality: N/A;  8:25    COLONOSCOPY WITH PROPOFOL  N/A 04/01/2023   Procedure: COLONOSCOPY WITH PROPOFOL ;  Surgeon: Eartha Angelia Sieving, MD;  [facility];  Service: Gastroenterology;  Laterality: N/A;   ESOPHAGEAL BRUSHING  04/01/2023   Procedure: ESOPHAGEAL BRUSHING;  Surgeon: Eartha Angelia, Sieving, MD;  [facility];  Service: Gastroenterology;;   ESOPHAGOGASTRODUODENOSCOPY (EGD) WITH PROPOFOL  N/A 04/01/2023   Procedure: ESOPHAGOGASTRODUODENOSCOPY (EGD) WITH PROPOFOL ;  Surgeon: Eartha Angelia Sieving, MD;  [facility];  Service: Gastroenterology;  Laterality: N/A;  1:00am;asa 3   fatty tumor removed     HARDWARE REMOVAL Right 08/07/2016   Procedure: HARDWARE REMOVAL;  Surgeon: Dempsey Moan, MD;  [facility]: [facility];  Service: Orthopedics;  Laterality: Right;   Hip replacement      Rt hip in 2010 in [place]   KNEE ARTHROSCOPY Left 03/29/2015   Procedure: ARTHROSCOPY LEFT KNEE WITH MENSICAL DEBRIDEMENT, chondroplasty;  Surgeon: Dempsey Moan, MD;  Location: [facility];  Service: Orthopedics;  Laterality: Left;   ORIF TIBIA PLATEAU Right 02/29/2016   Procedure: OPEN REDUCTION INTERNAL FIXATION (ORIF RIGHT  TIBIAL PLATEAU FRACTURE;  Surgeon: Dempsey Moan, MD;  [facility];  Service: Orthopedics;  Laterality: Right;   POLYPECTOMY  04/01/2023   Procedure: POLYPECTOMY;  Surgeon: Eartha Angelia Sieving, MD;  [facility];  Service: Gastroenterology;;   TEE WITHOUT CARDIOVERSION N/A 03/27/2021   Procedure: TRANSESOPHAGEAL ECHOCARDIOGRAM (TEE);  Surgeon: Alvan Dorn FALCON, MD;  [facility];  Service: Endoscopy;  Laterality: N/A;   TOTAL KNEE ARTHROPLASTY Right 08/25/2017   Procedure: TOTAL RIGHT KNEE ARTHROPLASTY;  Surgeon: Moan Dempsey, MD;  [facility]: [facility];  Service: Orthopedics;  Laterality: Right;    Family History  Problem Relation Age of Onset   Crohn's disease Brother    Depression Mother    Anxiety disorder Mother    Depression Sister     Aneurysm Father    Colon cancer Neg Hx     Allergies as of 08/14/2023 - Review Complete 08/14/2023  Allergen Reaction Noted   Morphine and codeine Hives and Itching 02/10/2013   Robaxin  [methocarbamol ] Itching 07/29/2016   Sulfa antibiotics Itching 07/29/2016    Social History   Socioeconomic History   Marital status: Married    Spouse name: Not on file   Number of children: Not on file   Years of education: Not on file   Highest education level: Not on file  Occupational History   Not on file  Tobacco Use   Smoking status: Never   Smokeless tobacco: Never  Vaping Use   Vaping status: Never Used  Substance and Sexual Activity   Alcohol  use: No    Alcohol /week: 0.0 standard drinks of alcohol    Drug use: No   Sexual activity: Not Currently  Other Topics Concern   Not on file  Social History Narrative   Not on file   Social Drivers of Health   Financial Resource Strain: Not on file  Food Insecurity: No Food Insecurity (07/14/2023)   Hunger Vital Sign    Worried About Running Out of Food in the Last Year: Never true    Ran Out of Food in the Last Year: Never true  Transportation Needs: No Transportation Needs (07/14/2023)   PRAPARE - Administrator, Civil Service (Medical): No    Lack of Transportation (Non-Medical): No  Physical Activity: Not on file  Stress: Not on file  Social Connections: Socially Integrated (07/14/2023)   Social Connection and Isolation Panel [NHANES]    Frequency of Communication with Friends and Family: More than three times a week    Frequency of Social Gatherings with Friends and Family: More than three times a week    Attends Religious Services: More than 4 times per year    Active Member of [employer] or Organizations: Not on file    Attends Engineer, Structural: More than 4 times per year    Marital Status: Married     Review of Systems   Gen: Denies fever, chills, anorexia. Denies fatigue, weakness, weight loss.  CV:  Denies chest pain, palpitations, syncope, peripheral edema, and claudication. Resp: Denies dyspnea at rest, cough, wheezing, coughing up blood, and pleurisy. GI: See HPI Derm: Denies rash, itching, dry skin Psych: Denies depression, anxiety, memory loss, confusion. No homicidal or suicidal ideation.  Heme: Denies bruising, bleeding, and enlarged lymph nodes.  Physical Exam   BP 116/70 (BP Location: Right Arm, Patient Position: Sitting, Cuff Size: Normal)   Pulse 68   Temp (!) 97.4 F (36.3 C) (Temporal)   Ht 5' 2 (1.575 m)   Wt 156 lb (70.8 kg)   BMI 28.53 kg/m   General:   Alert and oriented. No distress noted. Pleasant and cooperative.  Head:  Normocephalic and atraumatic. Eyes:  Conjuctiva clear without scleral icterus. Mouth:  Oral mucosa pink and moist. Good dentition. No lesions. Abdomen:  +BS, soft non-distended.  Mild TTP to lower abdomen and left upper quadrant.  No rebound or guarding. No HSM or masses noted. Rectal: deferred Msk:  Symmetrical without gross deformities. Normal posture. Extremities:  Without edema. Neurologic:  Alert and  oriented x4 Psych:  Alert and cooperative. Normal mood and affect.  Assessment  Jessica [name] is a 73 y.o. female with a history of small bowel and colonic Crohn's with right hemicolectomy (previously on Lialda ), history of pinhole fistula in 2019, HTN, chronic GERD, hypothyroidism, CKD stage III, and OSA presenting today to discuss further therapy for active Crohn's given inability to tolerate Entyvio .  Crohn's ileitis, constipation: History of small bowel Crohn's also with some potential right sided colitis.  Underwent right-sided hemicolectomy in the past.  Previously was not on any medications.  In the past she had been on Lialda .  Had recurrence of diarrhea as well as some anemia recently and underwent colonoscopy September 2024 with evidence of active ileitis and luminal narrowing but still patent.  Healthy colocolonic anastomosis  seen.  After discussion of IBD therapies to start, she ultimately decided on Stelara  which was too expensive with insurance coverage.  She made the decision to start on Entyvio , after her second induction dose she began having worsening joint pain.  Also after this second dose she developed pneumonia and also was hospitalized for a PE has been started on Eliquis .  She recently has not been experiencing any diarrhea, has actually been more constipated.  No evidence of any GI bleeding.  Recommended starting Miralax  for constipation, I suspect this is the cause of her lower abdominal tenderness given she has been straining and decreased bowel movements.  We had an extensive discussion today on her further options of treatment for Crohn's.  Anemia, PE: Continues to have some mild anemia although currently stable.  Still having fatigue, this is likely in the setting of her shortness of breath and PE as well as her baseline anemia.  Currently doing well with  Eliquis  without any signs of GI bleeding.  Colonoscopy and EGD as outlined in HPI.  May need to consider following with hematology given no identified cause for PE as she had peripheral Dopplers that were negative.  Advised her to continue to take her daily iron.  GERD, dysphagia, esophagitis: Has had intermittent reflux however GERD symptoms are fairly well-controlled with pantoprazole  40 mg twice daily.  Previously with some complaints of dysphagia, nothing overt today.  Originally scheduled for repeat EGD for evaluation of esophagitis in early January however this was canceled given her PE.  We will plan to reschedule EGD with possible dilation in the future after receiving medical clearance from cardiology and pulmonology as well as clearance to hold her Eliquis  for 2 days, she may likely need to stay on therapy for 3 to 6 months uninterrupted.  PLAN   Continue daily iron Offered options of continuing Entyvio  versus Remicade versus resubmitting for  Stelara  -patient will continue to think about this and let me know what she is decides. Start miralax  17g daily. Hold on imodium  Proceed with upper endoscopy with with propofol  by Dr. Eartha in near future: the risks, benefits, and alternatives have been discussed with the patient in detail. The patient states understanding and desires to proceed. ASA 3 Clearance from Pulmonology and cardiology Clearance to hold Eliquis  -may need to postpone if unable to interrupt therapy Hold iron for 7 days. Continue pantoprazole  40 mg twice daily Follow up 3 months     Charmaine Melia, MSN, FNP-BC, AGACNP-BC [facility]  I have reviewed the note and agree with the APP's assessment as described in this progress note  Toribio Eartha, MD Gastroenterology and Hepatology [facility]

## 2023-08-14 ENCOUNTER — Telehealth: Payer: Self-pay | Admitting: *Deleted

## 2023-08-14 ENCOUNTER — Ambulatory Visit: Payer: Medicare Other | Admitting: Gastroenterology

## 2023-08-14 ENCOUNTER — Encounter: Payer: Self-pay | Admitting: Gastroenterology

## 2023-08-14 VITALS — BP 116/70 | HR 68 | Temp 97.4°F | Ht 62.0 in | Wt 156.0 lb

## 2023-08-14 DIAGNOSIS — D508 Other iron deficiency anemias: Secondary | ICD-10-CM

## 2023-08-14 DIAGNOSIS — K2100 Gastro-esophageal reflux disease with esophagitis, without bleeding: Secondary | ICD-10-CM | POA: Diagnosis not present

## 2023-08-14 DIAGNOSIS — K219 Gastro-esophageal reflux disease without esophagitis: Secondary | ICD-10-CM

## 2023-08-14 DIAGNOSIS — I2699 Other pulmonary embolism without acute cor pulmonale: Secondary | ICD-10-CM | POA: Diagnosis not present

## 2023-08-14 DIAGNOSIS — D649 Anemia, unspecified: Secondary | ICD-10-CM

## 2023-08-14 DIAGNOSIS — K5900 Constipation, unspecified: Secondary | ICD-10-CM

## 2023-08-14 DIAGNOSIS — R1310 Dysphagia, unspecified: Secondary | ICD-10-CM | POA: Diagnosis not present

## 2023-08-14 DIAGNOSIS — K5080 Crohn's disease of both small and large intestine without complications: Secondary | ICD-10-CM

## 2023-08-14 NOTE — Telephone Encounter (Signed)
 Patient is needing Pulmonary Clearance  What type of surgery is being performed? EGD/DILATION  When is surgery scheduled? TBD  Name of physician performing surgery?  Dr. Toribio Fortune [facility] Phone: [phone] Fax: [phone]  Anethesia type (none, local, MAC, general)? MAC

## 2023-08-14 NOTE — Patient Instructions (Addendum)
 Will reach out to cardiology and pulmonology for clearance to proceed with an upper endoscopy for follow-up of your esophagitis.  Continue pantoprazole  40 mg twice daily  Continue to hold on any Imodium  or dicyclomine . For constipation I want you to start MiraLAX  17 g once daily.  Continue taking your iron once daily.  Continue to look over the pamphlet provided for you today.  Please let me know if you would like to proceed with Entyvio  and monitoring for other side effects/worsening joint pain versus starting on Remicade or resubmitting for Stelara  given you are signing up for the payment plan.  Follow-up in 3 months  It was a pleasure to see you today. I want to create trusting relationships with patients. If you receive a survey regarding your visit,  I greatly appreciate you taking time to fill this out on paper or through your MyChart. I value your feedback.  Charmaine Melia, MSN, FNP-BC, AGACNP-BC [facility]

## 2023-08-14 NOTE — Telephone Encounter (Signed)
  Request for patient to stop medication prior to procedure or is needing cleareance  08/14/23  Jessica [name] 08-17-1950  What type of surgery is being performed? EGD/DILATION  When is surgery scheduled? TBD  What type of clearance is required (medical or pharmacy to hold medication or both? BOTH  Are there any medications that need to be held prior to surgery and how long? ELIQUIS  X 2 DAYS  Name of physician performing surgery?  Dr. Eartha Rouse [facility]enterology at [facility] Phone: [phone] Fax: [phone]  Anethesia type (none, local, MAC, general)? MAC

## 2023-08-15 DIAGNOSIS — N1832 Chronic kidney disease, stage 3b: Secondary | ICD-10-CM | POA: Diagnosis not present

## 2023-08-15 DIAGNOSIS — G609 Hereditary and idiopathic neuropathy, unspecified: Secondary | ICD-10-CM | POA: Diagnosis not present

## 2023-08-15 DIAGNOSIS — E871 Hypo-osmolality and hyponatremia: Secondary | ICD-10-CM | POA: Diagnosis not present

## 2023-08-15 DIAGNOSIS — I2699 Other pulmonary embolism without acute cor pulmonale: Secondary | ICD-10-CM | POA: Diagnosis not present

## 2023-08-15 DIAGNOSIS — D509 Iron deficiency anemia, unspecified: Secondary | ICD-10-CM | POA: Diagnosis not present

## 2023-08-15 DIAGNOSIS — E440 Moderate protein-calorie malnutrition: Secondary | ICD-10-CM | POA: Diagnosis not present

## 2023-08-18 NOTE — Telephone Encounter (Signed)
 Primary Cardiologist:Branch, Arlyce Lambert, MD  Chart reviewed as part of pre-operative protocol coverage. Because of Cristine A Moret's past medical history and time since last visit, he/she will require a follow-up visit in order to better assess preoperative cardiovascular risk.  Pre-op covering staff: - Patient has upcoming appointment on 08/25/23 at which time clearance will be addressed.  - Please contact requesting surgeon's office via preferred method (i.e, phone, fax) to inform them of need for appointment prior to surgery.  Anticoag requested has been addressed by pulmonology.   Gerldine Koch, NP-C  08/18/2023, 10:58 AM [address] Office [phone] Fax [phone]

## 2023-08-18 NOTE — Telephone Encounter (Signed)
 Thanks for the update.  Tammy, she will be seeing you on 08/26/2023.  Please update us  if she is cleared to hold her Eliquis  for 48 hours.  Thanks

## 2023-08-18 NOTE — Telephone Encounter (Signed)
 Patient on anticoagulation for PE in Jan. I will defer to pulmonology.

## 2023-08-19 ENCOUNTER — Other Ambulatory Visit: Payer: Self-pay | Admitting: Gastroenterology

## 2023-08-19 DIAGNOSIS — K5000 Crohn's disease of small intestine without complications: Secondary | ICD-10-CM

## 2023-08-19 DIAGNOSIS — K5080 Crohn's disease of both small and large intestine without complications: Secondary | ICD-10-CM

## 2023-08-19 MED ORDER — RINVOQ 15 MG PO TB24: 15.0000 mg | ORAL_TABLET | Freq: Every day | ORAL | 5 refills | Status: DC

## 2023-08-19 MED ORDER — RINVOQ 45 MG PO TB24: 45.0000 mg | ORAL_TABLET | Freq: Every day | ORAL | 0 refills | Status: DC

## 2023-08-20 ENCOUNTER — Encounter: Payer: Medicare Other | Attending: Gastroenterology | Admitting: Nurse Practitioner

## 2023-08-20 ENCOUNTER — Encounter: Payer: Self-pay | Admitting: Nurse Practitioner

## 2023-08-20 VITALS — BP 120/64 | HR 75 | Ht 62.0 in | Wt 157.2 lb

## 2023-08-20 DIAGNOSIS — T148XXA Other injury of unspecified body region, initial encounter: Secondary | ICD-10-CM | POA: Insufficient documentation

## 2023-08-20 DIAGNOSIS — I340 Nonrheumatic mitral (valve) insufficiency: Secondary | ICD-10-CM | POA: Insufficient documentation

## 2023-08-20 DIAGNOSIS — N183 Chronic kidney disease, stage 3 unspecified: Secondary | ICD-10-CM | POA: Insufficient documentation

## 2023-08-20 DIAGNOSIS — T148XXD Other injury of unspecified body region, subsequent encounter: Secondary | ICD-10-CM

## 2023-08-20 DIAGNOSIS — R0609 Other forms of dyspnea: Secondary | ICD-10-CM | POA: Diagnosis not present

## 2023-08-20 DIAGNOSIS — Z8673 Personal history of transient ischemic attack (TIA), and cerebral infarction without residual deficits: Secondary | ICD-10-CM | POA: Insufficient documentation

## 2023-08-20 DIAGNOSIS — I2699 Other pulmonary embolism without acute cor pulmonale: Secondary | ICD-10-CM | POA: Insufficient documentation

## 2023-08-20 DIAGNOSIS — I10 Essential (primary) hypertension: Secondary | ICD-10-CM | POA: Insufficient documentation

## 2023-08-20 DIAGNOSIS — D649 Anemia, unspecified: Secondary | ICD-10-CM | POA: Diagnosis not present

## 2023-08-20 NOTE — Patient Instructions (Addendum)
Medication Instructions:  Your physician recommends that you continue on your current medications as directed. Please refer to the Current Medication list given to you today.  *If you need a refill on your cardiac medications before your next appointment, please call your pharmacy*   Lab Work: None If you have labs (blood work) drawn today and your tests are completely normal, you will receive your results only by: MyChart Message (if you have MyChart) OR A paper copy in the mail If you have any lab test that is abnormal or we need to change your treatment, we will call you to review the results.   Testing/Procedures: None   Follow-Up: At [facility], you and your health needs are our priority.  As part of our continuing mission to provide you with exceptional heart care, we have created designated Provider Care Teams.  These Care Teams include your primary Cardiologist (physician) and Advanced Practice Providers (APPs -  Physician Assistants and Nurse Practitioners) who all work together to provide you with the care you need, when you need it.  We recommend signing up for the patient portal called "MyChart".  Sign up information is provided on this After Visit Summary.  MyChart is used to connect with patients for Virtual Visits (Telemedicine).  Patients are able to view lab/test results, encounter notes, upcoming appointments, etc.  Non-urgent messages can be sent to your provider as well.   To learn more about what you can do with MyChart, go to [email or web address].    Your next appointment:   3-4 month(s)  Provider:   You will see one of the following Advanced Practice Providers on your designated Care Team:   Sharlene Dory, NP  Other Instructions

## 2023-08-20 NOTE — Progress Notes (Signed)
Cardiology Office Note:  .   Date:  08/20/2023 ID:  Jessica [name], DOB October 13, 1950, MRN [id number] PCP: Ignatius Specking, MD  [facility] Providers Cardiologist:  Dina Rich, MD    History of Present Illness: .   Jessica [name] is a 73 y.o. female with a PMH of palpitations, mitral valvular regurgitation, OSA, HTN, DOE, leg edema, chronic fatigue, past CVA, hypothyroidism, CKD (followed by Dr. Wolfgang Phoenix), who presents today for follow-up.   Echo and Lexi scan from 2023 benign.  Last seen by Dr. Dina Rich on September 18, 2022.  She noted follow-up palpitations at times, tolerable while on Toprol-XL.  She was considering knee replacement at that time.  July 2024-today she presents for follow-up and for pre-op cardiovascular risk assessment.  She states she is pending EGD and colonoscopy d/t low hemoglobin. CC today is chronic DOE/SHOB and fatigue. Will be switching from CPAP to BiPAP soon, follows Dr. Vassie Loll. Denies any chest pain, palpitations, syncope, presyncope, dizziness, orthopnea, PND, swelling or significant weight changes, or claudication. Does admit to easy, significant bruising.   In the interim, hospitalized in January 2025 for pulmonary embolism.  Was dealing with shortness of breath with exertion, exercise intolerance for past several months.  Patient states she sought medical care within the ED and outpatient for several times and it was thought that she had pneumonia and was treated for pneumonia however symptoms never resolved.  VQ scan was consistent with PE, was questionable if it was provoked as she took a trip to [place] during the end of December.  Lower extremity Dopplers negative for DVT.  Echocardiogram revealed EF 55 to 60%, no right heart strain, no significant valvular abnormalities.  Was discharged on Eliquis for at least 6 months.  Today she presents for scheduled follow-up.  She states she continues to note stable and chronic intermittent shortness of breath for  the last 7 months.  Typically this is noted with exertion while she is walking.  She wants to know what caused her pulmonary embolism, says she is unsure regarding this. Denies any chest pain, palpitations, syncope, presyncope, dizziness, orthopnea, PND, swelling or significant weight changes, acute bleeding, or claudication.  Studies Reviewed: Marland Kitchen    Ultrasound venous lower extremity bilateral (DVT) 1/25: No evidence of DVT in either lower extremity.  Echocardiogram 07/2023: 1. Left ventricular ejection fraction, by estimation, is 55 to 60%. The  left ventricle has normal function. The left ventricle has no regional  wall motion abnormalities. Left ventricular diastolic parameters were  normal.   2. Right ventricular systolic function is normal. The right ventricular  size is normal.   3. The mitral valve is abnormal. Mild mitral valve regurgitation. No  evidence of mitral stenosis.   4. The aortic valve is tricuspid. There is moderate calcification of the  aortic valve. There is moderate thickening of the aortic valve. Aortic  valve regurgitation is not visualized. No aortic stenosis is present.   5. The inferior vena cava is normal in size with greater than 50%  respiratory variability, suggesting right atrial pressure of 3 mmHg.  Lexiscan 03/2022:    The study is normal. The study is low risk.   No ST deviation was noted.   LV perfusion is normal. Small apical defect in resting and stress images with normal wall motion consistent with apical thinning.   Left ventricular function is normal. End diastolic cavity size is normal.  Echo 12/2021:  1. Left ventricular ejection fraction, by estimation, is  55 to 60%. The  left ventricle has normal function. The left ventricle has no regional  wall motion abnormalities. Left ventricular diastolic parameters are  consistent with Grade I diastolic  dysfunction (impaired relaxation). The average left ventricular global  longitudinal strain is  -19.4 %. The global longitudinal strain is normal.   2. Right ventricular systolic function is normal. The right ventricular  size is normal. There is normal pulmonary artery systolic pressure.   3. The mitral valve is abnormal. Mild to moderate mitral valve  regurgitation. No evidence of mitral stenosis.   4. The tricuspid valve is abnormal.   5. The aortic valve is tricuspid. Aortic valve regurgitation is not  visualized. No aortic stenosis is present.   6. The inferior vena cava is normal in size with greater than 50%  respiratory variability, suggesting right atrial pressure of 3 mmHg.   Comparison(s): TEE done 03/27/21 showed an EF of 60-65% with moderate MR.  Risk Assessment/Calculations:       The 10-year ASCVD risk score (Arnett DK, et al., 2019) is: 12.1%   Values used to calculate the score:     Age: 83 years     Sex: Female     Is Non-Hispanic African American: No     Diabetic: No     Tobacco smoker: No     Systolic Blood Pressure: 120 mmHg     Is BP treated: Yes     HDL Cholesterol: 45 mg/dL     Total Cholesterol: 162 mg/dL  Physical Exam:   VS:  BP 120/64   Pulse 75   Ht 5\' 2"  (1.575 m)   Wt 157 lb 3.2 oz (71.3 kg)   SpO2 95%   BMI 28.75 kg/m    Wt Readings from Last 3 Encounters:  08/20/23 157 lb 3.2 oz (71.3 kg)  08/14/23 156 lb (70.8 kg)  07/22/23 149 lb 9.6 oz (67.9 kg)    GEN: Well nourished, well developed in no acute distress NECK: No JVD; No carotid bruits CARDIAC: S1/S2, RRR, no murmurs, rubs, gallops RESPIRATORY:  Clear to auscultation without rales, wheezing or rhonchi  ABDOMEN: Soft, non-tender, non-distended EXTREMITIES:  No edema, bruising to bilateral arms; No deformity   ASSESSMENT AND PLAN: .    Acute pulmonary embolism, shortness of breath with exertion/DOE Recently diagnosed in January 2025.  Difficult to determine if there was provoked or unprovoked.  Lower extremity Dopplers negative for DVT.  No evidence of heart strain noted on  echocardiogram.  Normal LVEF.  NST in 2023 was low risk.  Continues to admit to stable and chronic shortness of breath intermittently over the past 7 months.  Will arrange follow-up with hematology/oncology for further management and evaluation.  Continue Eliquis, denies any bleeding issues but continues to mid to easy bruising-see below.  No medication changes at this time.  Continue follow-up with PCP.  Care and ED precautions discussed.  Continue follow-up with Dr. Vassie Loll.  HTN BP stable. Discussed to monitor BP at home at least 2 hours after medications and sitting for 5-10 minutes. Continue current medication regimen. Heart healthy diet and regular cardiovascular exercise encouraged.   4. Mitral valvular regurgitation   Mild MR noted on recent echo, plan to update Echo within the next 2 to 3 years. Continue current meds. Heart healthy diet and regular cardiovascular exercise encouraged.   5. Past hx of CVA Chronic lacunar infarcts noted on MRI in 2020.  Continue current medication regimen.  She defers labs at this  time.  Continue follow-up with PCP.  At next office visit, plan to request labs from PCP.  6. CKD stage 3 Most recent labs show sCr at 1.55 eGFR 46. Avoid nephrotoxic agents. Continue to follow with PCP and Nephrology.   7. Anemia, easy bruising Most recent Hgb around 10.7, admits to easy bruising, normal platelet count. Follow-up with Hematology. Care precautions discussed.   Dispo: Follow-up with me or APP in 3-4 months or sooner if anything changes.   Signed, Sharlene Dory, NP

## 2023-08-21 NOTE — Telephone Encounter (Signed)
Of course, I'll keep you updated.

## 2023-08-24 NOTE — Progress Notes (Signed)
[facility] [address] [place] [address]   Clinic Day:  08/25/2023  Referring physician: Ignatius Specking, MD  Patient Care Team: Jessica Specking, MD as PCP - General (Internal Medicine) Jessica [name] Jessica Pea, MD as PCP - Cardiology (Cardiology) Jessica Gross, MD as Consulting Physician (Orthopedic Surgery) Jessica Massed, MD as Medical Oncologist (Hematology)   ASSESSMENT & PLAN:   Assessment:  1.  Weakly provoked pulmonary embolism: - Worsening shortness of breath over the last 1 year.  3-hour car ride from [place] to [place] and a 2-hour flight from [place] to [place] on 07/03/2023. - Crohn's disease since 1983, was on Entyvio prior to the pulmonary embolism. - NM perfusion scan (07/14/2023): Peripheral wedge-shaped perfusion defect along the anterior right middle lobe, suggestive of pulmonary embolism - Lower extremity Doppler (07/15/2023): No evidence of DVT. - Started on Eliquis with some improvement in shortness of breath.  2.  Social/family history: - Lives at home with her husband.  Retired after working at [employer].  She also did factory work.  Non-smoker.  No chemical exposure. - No family history of DVT/PE.  No family history of miscarriages.  Maternal aunt had cancer.  Brother has Crohn's disease.  Plan:  1.  Weakly provoked pulmonary embolism: - She has a hypercoagulable state from Crohn's disease.  Will likely err on the side of long-term anticoagulation. - Will check lupus anticoagulant, anticardiolipin antibodies, antibeta 2 glycoprotein antibodies, factor V Leiden, prothrombin gene mutation.  Will also check protein C and S, and Antithrombin III levels. - RTC 4 weeks for follow-up.  2.  Normocytic anemia: - Has history of normocytic anemia, last Monoferric on 07/23/2023. - Will check CBC, ferritin, iron panel, B12 and folic acid levels today.   Orders Placed This Encounter  Procedures   CBC with  Differential    Standing Status:   Future    Number of Occurrences:   1    Expected Date:   08/25/2023    Expiration Date:   08/24/2024   Ferritin    Standing Status:   Future    Number of Occurrences:   1    Expected Date:   08/25/2023    Expiration Date:   08/24/2024   Iron and TIBC ([facility] ONLY)    Standing Status:   Future    Number of Occurrences:   1    Expected Date:   08/25/2023    Expiration Date:   08/24/2024   Vitamin B12    Standing Status:   Future    Number of Occurrences:   1    Expected Date:   08/25/2023    Expiration Date:   08/24/2024   Folate    Standing Status:   Future    Number of Occurrences:   1    Expected Date:   08/25/2023    Expiration Date:   08/24/2024   Lupus anticoagulant panel    Standing Status:   Future    Number of Occurrences:   1    Expected Date:   08/25/2023    Expiration Date:   08/24/2024   Cardiolipin antibodies, IgG, IgM, IgA    Standing Status:   Future    Number of Occurrences:   1    Expected Date:   08/25/2023    Expiration Date:   08/24/2024   Beta-2-glycoprotein i abs, IgG/M/A    Standing Status:   Future    Number of  Occurrences:   1    Expected Date:   08/25/2023    Expiration Date:   08/24/2024   Prothrombin gene mutation    Standing Status:   Future    Number of Occurrences:   1    Expected Date:   08/25/2023    Expiration Date:   08/24/2024   Protein C activity    Standing Status:   Future    Number of Occurrences:   1    Expected Date:   08/25/2023    Expiration Date:   08/24/2024   Protein C, total    Standing Status:   Future    Number of Occurrences:   1    Expected Date:   08/25/2023    Expiration Date:   08/24/2024   Protein S, total    Standing Status:   Future    Number of Occurrences:   1    Expected Date:   08/25/2023    Expiration Date:   08/24/2024   Protein S activity    Standing Status:   Future    Number of Occurrences:   1    Expected Date:   08/25/2023    Expiration Date:   08/24/2024    Antithrombin III    Standing Status:   Future    Number of Occurrences:   1    Expected Date:   08/25/2023    Expiration Date:   08/24/2024   Factor 5 leiden    Standing Status:   Future    Number of Occurrences:   1    Expected Date:   08/25/2023    Expiration Date:   08/24/2024      I,Jessica [name],acting as a scribe for Jessica Massed, MD.,have documented all relevant documentation on the behalf of Jessica Massed, MD,as directed by  Jessica Massed, MD while in the presence of Jessica Massed, MD.   I, Jessica Massed MD, have reviewed the above documentation for accuracy and completeness, and I agree with the above.   Jessica Massed, MD   2/17/202510:30 AM  CHIEF COMPLAINT/PURPOSE OF CONSULT:   Diagnosis: PE  Current Therapy:  Eliquis  HISTORY OF PRESENT ILLNESS:   Jessica [name] is a 73 y.o. female presenting to clinic today for evaluation of PE at the request of Jessica [name].  She presented with worsening shortness of breath over several months. She sough medical attention and was treated for pneumonia, as well as anxiety. Most recently, she presented back to the ED on 07/14/23. Initial work up showed an elevated D-dimer. This prompted a perfusion lung scan, which revealed a pulmonary embolism. There was question if this was provoked, given a recent trip she took at the end of December. She was subsequently admitted for anticoagulation. Fortunately, a bilateral LE doppler performed the following day was negative for DVT. She was discharged on Eliquis.  Today, she states that she is doing well overall. Her appetite level is at 100%. Her energy level is at 25%.  PAST MEDICAL HISTORY:   Past Medical History: Past Medical History:  Diagnosis Date   Anxiety attack    Arthritis    Back pain, chronic    Chronic kidney disease    Crohn's colitis (HCC)    Depression (emotion)    Dysrhythmia    GERD (gastroesophageal reflux disease)    Hypertension     Hypothyroid    Iron deficiency anemia, unspecified    Neuropathy    Sleep apnea    cpap is not  working per patient - needs to find a new doctor - not used since 7/16 per pat    Stage 3 chronic kidney disease (HCC)    Vertigo    followed by Dr Jessica [name]- in [place]     Surgical History: Past Surgical History:  Procedure Laterality Date   ABDOMINAL HYSTERECTOMY     BIOPSY  06/08/2018   Procedure: BIOPSY;  Surgeon: Malissa Hippo, MD;  [facility];  Service: Endoscopy;;  (colon)   BIOPSY  04/01/2023   Procedure: BIOPSY;  Surgeon: Dolores Frame, MD;  [facility];  Service: Gastroenterology;;   CHOLECYSTECTOMY     COLON SURGERY     for Crohn's in the 71s in [place]   COLONOSCOPY N/A 03/11/2013   Procedure: COLONOSCOPY;  Surgeon: Malissa Hippo, MD;  [facility];  Service: Endoscopy;  Laterality: N/A;  1200   COLONOSCOPY N/A 06/08/2018   Procedure: COLONOSCOPY;  Surgeon: Malissa Hippo, MD;  [facility];  Service: Endoscopy;  Laterality: N/A;  8:25   COLONOSCOPY WITH PROPOFOL N/A 04/01/2023   Procedure: COLONOSCOPY WITH PROPOFOL;  Surgeon: Dolores Frame, MD;  [facility];  Service: Gastroenterology;  Laterality: N/A;   ESOPHAGEAL BRUSHING  04/01/2023   Procedure: ESOPHAGEAL BRUSHING;  Surgeon: Marguerita Merles, Reuel Boom, MD;  [facility];  Service: Gastroenterology;;   ESOPHAGOGASTRODUODENOSCOPY (EGD) WITH PROPOFOL N/A 04/01/2023   Procedure: ESOPHAGOGASTRODUODENOSCOPY (EGD) WITH PROPOFOL;  Surgeon: Dolores Frame, MD;  [facility];  Service: Gastroenterology;  Laterality: N/A;  1:00am;asa 3   fatty tumor removed     HARDWARE REMOVAL Right 08/07/2016   Procedure: HARDWARE REMOVAL;  Surgeon: Jessica Gross, MD;  [facility]: [facility];  Service: Orthopedics;  Laterality: Right;   Hip replacement      Rt hip in 2010 in [place]   KNEE ARTHROSCOPY Left 03/29/2015   Procedure: ARTHROSCOPY  LEFT KNEE WITH MENSICAL DEBRIDEMENT, chondroplasty;  Surgeon: Jessica Gross, MD;  Location: [facility];  Service: Orthopedics;  Laterality: Left;   ORIF TIBIA PLATEAU Right 02/29/2016   Procedure: OPEN REDUCTION INTERNAL FIXATION (ORIF RIGHT  TIBIAL PLATEAU FRACTURE;  Surgeon: Jessica Gross, MD;  [facility]: [facility];  Service: Orthopedics;  Laterality: Right;   POLYPECTOMY  04/01/2023   Procedure: POLYPECTOMY;  Surgeon: Dolores Frame, MD;  [facility];  Service: Gastroenterology;;   TEE WITHOUT CARDIOVERSION N/A 03/27/2021   Procedure: TRANSESOPHAGEAL ECHOCARDIOGRAM (TEE);  Surgeon: Antoine Poche, MD;  [facility];  Service: Endoscopy;  Laterality: N/A;   TOTAL KNEE ARTHROPLASTY Right 08/25/2017   Procedure: TOTAL RIGHT KNEE ARTHROPLASTY;  Surgeon: Jessica Gross, MD;  Location: [facility];  Service: Orthopedics;  Laterality: Right;    Social History: Social History   Socioeconomic History   Marital status: Married    Spouse name: Not on file   Number of children: Not on file   Years of education: Not on file   Highest education level: Not on file  Occupational History   Not on file  Tobacco Use   Smoking status: Never   Smokeless tobacco: Never  Vaping Use   Vaping status: Never Used  Substance and Sexual Activity   Alcohol use: No    Alcohol/week: 0.0 standard drinks of alcohol   Drug use: No   Sexual activity: Not Currently  Other Topics Concern   Not on file  Social History Narrative   Not on file   Social Drivers of  Health   Financial Resource Strain: Not on file  Food Insecurity: No Food Insecurity (07/14/2023)   Hunger Vital Sign    Worried About Running Out of Food in the Last Year: Never true    Ran Out of Food in the Last Year: Never true  Transportation Needs: No Transportation Needs (07/14/2023)   PRAPARE - Administrator, Civil Service (Medical): No    Lack of Transportation (Non-Medical): No  Physical Activity: Not on file   Stress: Not on file  Social Connections: Socially Integrated (07/14/2023)   Social Connection and Isolation Panel [NHANES]    Frequency of Communication with Friends and Family: More than three times a week    Frequency of Social Gatherings with Friends and Family: More than three times a week    Attends Religious Services: More than 4 times per year    Active Member of [employer] or Organizations: Not on file    Attends Banker Meetings: More than 4 times per year    Marital Status: Married  Catering manager Violence: Not At Risk (07/14/2023)   Humiliation, Afraid, Rape, and Kick questionnaire    Fear of Current or Ex-Partner: No    Emotionally Abused: No    Physically Abused: No    Sexually Abused: No    Family History: Family History  Problem Relation Age of Onset   Crohn's disease Brother    Depression Mother    Anxiety disorder Mother    Depression Sister    Aneurysm Father    Colon cancer Neg Hx     Current Medications:  Current Outpatient Medications:    acetaminophen (TYLENOL) 325 MG tablet, Take 2 tablets (650 mg total) by mouth every 6 (six) hours as needed for mild pain (pain score 1-3), fever or headache (or Fever >/= 101)., Disp: , Rfl:    Albuterol-Budesonide (AIRSUPRA) 90-80 MCG/ACT AERO, Inhale 2 puffs into the lungs every 8 (eight) hours., Disp: , Rfl:    amLODipine (NORVASC) 10 MG tablet, Take 0.5 tablets (5 mg total) by mouth daily., Disp: , Rfl:    apixaban (ELIQUIS) 5 MG TABS tablet, Take 1 tablet (5 mg total) by mouth 2 (two) times daily., Disp: 60 tablet, Rfl: 4   Apixaban Starter Pack, 10mg  and 5mg , (ELIQUIS DVT/PE STARTER PACK), Take as directed on package: start with two-5mg  tablets twice daily for 7 days. On day 8, switch to one-5mg  tablet twice daily., Disp: 1 each, Rfl: 0   buPROPion (WELLBUTRIN XL) 150 MG 24 hr tablet, TAKE 1 TABLET BY MOUTH ONCE DAILY IN THE MORNING, Disp: 30 tablet, Rfl: 0   dicyclomine (BENTYL) 10 MG capsule, TAKE 1 CAPSULE  BY MOUTH TWICE DAILY BEFORE A MEAL, Disp: 60 capsule, Rfl: 0   ferrous sulfate 325 (65 FE) MG EC tablet, Take 1 tablet (325 mg total) by mouth daily with breakfast., Disp: 90 tablet, Rfl: 3   gabapentin (NEURONTIN) 300 MG capsule, TAKE 1 CAPSULE BY MOUTH THREE TIMES DAILY, Disp: 90 capsule, Rfl: 0   HYDROcodone-acetaminophen (NORCO/VICODIN) 5-325 MG tablet, Take 1 tablet by mouth every 6 (six) hours as needed for up to 20 doses for moderate pain (pain score 4-6)., Disp: 20 tablet, Rfl: 0   levothyroxine (SYNTHROID) 100 MCG tablet, Take 100 mcg by mouth daily., Disp: , Rfl:    loperamide (IMODIUM) 2 MG capsule, TAKE 1 CAPSULE BY MOUTH TWICE DAILY AS NEEDED FOR LOOSE STOOLS, Disp: 180 capsule, Rfl: 1   metoprolol succinate (TOPROL-XL) 25 MG  24 hr tablet, Take 1.5 tablets (37.5 mg total) by mouth daily. TAKE 1 & 1/2 (ONE & ONE-HALF) TABLETS BY MOUTH ONCE DAILY, Disp: 45 tablet, Rfl: 3   Multiple Vitamin (MULTIVITAMIN WITH MINERALS) TABS tablet, Take 1 tablet by mouth daily., Disp: , Rfl:    pantoprazole (PROTONIX) 40 MG tablet, Take 1 tablet (40 mg total) by mouth 2 (two) times daily., Disp: 180 tablet, Rfl: 3   predniSONE (DELTASONE) 10 MG tablet, Take 2 tabs daily with food x 10ds, then 1 tab daily with food x 10ds then STOP, Disp: 40 tablet, Rfl: 0   rOPINIRole (REQUIP) 1 MG tablet, Take 1 mg by mouth at bedtime., Disp: , Rfl:    rosuvastatin (CRESTOR) 10 MG tablet, Take 1 tablet (10 mg total) by mouth daily., Disp: 90 tablet, Rfl: 2   spironolactone (ALDACTONE) 50 MG tablet, Take 50 mg by mouth daily., Disp: , Rfl:    [START ON 11/11/2023] Upadacitinib ER (RINVOQ) 15 MG TB24, Take 1 tablet (15 mg total) by mouth daily. Start after completing 12 weeks of Rinvoq 45 mg., Disp: 30 tablet, Rfl: 5   Upadacitinib ER (RINVOQ) 45 MG TB24, Take 1 tablet (45 mg total) by mouth daily. Take for 12 weeks then reduce to 15 mg once daily., Disp: 84 tablet, Rfl: 0   Allergies: Allergies  Allergen Reactions    Morphine And Codeine Hives and Itching    May cause blood pressure to drop   Robaxin [Methocarbamol] Itching   Sulfa Antibiotics Itching    ALL SULFA DRUGS    REVIEW OF SYSTEMS:   Review of Systems  Constitutional:  Negative for chills, fatigue and fever.  HENT:   Negative for lump/mass, mouth sores, nosebleeds, sore throat and trouble swallowing.   Eyes:  Negative for eye problems.  Respiratory:  Negative for cough and shortness of breath.   Cardiovascular:  Negative for chest pain, leg swelling and palpitations.  Gastrointestinal:  Positive for diarrhea. Negative for abdominal pain, constipation, nausea and vomiting.  Genitourinary:  Negative for bladder incontinence, difficulty urinating, dysuria, frequency, hematuria and nocturia.   Musculoskeletal:  Positive for arthralgias. Negative for back pain, flank pain, myalgias and neck pain.  Skin:  Negative for itching and rash.  Neurological:  Negative for dizziness, headaches and numbness.  Hematological:  Does not bruise/bleed easily.  Psychiatric/Behavioral:  Negative for depression, sleep disturbance and suicidal ideas. The patient is not nervous/anxious.   All other systems reviewed and are negative.    VITALS:   Blood pressure 98/71, pulse 71, temperature 98 F (36.7 C), temperature source Oral, resp. rate 17, height 5\' 2"  (1.575 m), weight 150 lb 11.2 oz (68.4 kg), SpO2 100%.  Wt Readings from Last 3 Encounters:  08/25/23 150 lb 11.2 oz (68.4 kg)  08/20/23 157 lb 3.2 oz (71.3 kg)  08/14/23 156 lb (70.8 kg)    Body mass index is 27.56 kg/m.   PHYSICAL EXAM:   Physical Exam Vitals and nursing note reviewed. Exam conducted with a chaperone present.  Constitutional:      Appearance: Normal appearance.  Cardiovascular:     Rate and Rhythm: Normal rate and regular rhythm.     Pulses: Normal pulses.     Heart sounds: Normal heart sounds.  Pulmonary:     Effort: Pulmonary effort is normal.     Breath sounds: Normal  breath sounds.  Abdominal:     Palpations: Abdomen is soft. There is no hepatomegaly, splenomegaly or mass.  Tenderness: There is no abdominal tenderness.  Musculoskeletal:     Right lower leg: No edema.     Left lower leg: No edema.  Lymphadenopathy:     Cervical: No cervical adenopathy.     Right cervical: No superficial, deep or posterior cervical adenopathy.    Left cervical: No superficial, deep or posterior cervical adenopathy.     Upper Body:     Right upper body: No supraclavicular or axillary adenopathy.     Left upper body: No supraclavicular or axillary adenopathy.  Neurological:     General: No focal deficit present.     Mental Status: She is alert and oriented to person, place, and time.  Psychiatric:        [name] and Affect: [name] normal.        Behavior: Behavior normal.    LABS:   CBC    Component Value Date/Time   WBC 4.8 08/25/2023 0847   RBC 3.98 08/25/2023 0847   HGB 13.5 08/25/2023 0847   HGB 10.8 (L) 01/28/2023 1153   HCT 38.1 08/25/2023 0847   HCT 32.7 (L) 01/28/2023 1153   PLT 212 08/25/2023 0847   PLT 138 (L) 01/28/2023 1153   MCV 95.7 08/25/2023 0847   MCV 98 (H) 01/28/2023 1153   MCH 33.9 08/25/2023 0847   MCHC 35.4 08/25/2023 0847   RDW 12.5 08/25/2023 0847   RDW 12.2 01/28/2023 1153   LYMPHSABS 1.6 08/25/2023 0847   LYMPHSABS 1.3 01/28/2023 1153   MONOABS 0.4 08/25/2023 0847   EOSABS 0.3 08/25/2023 0847   EOSABS 0.3 01/28/2023 1153   BASOSABS 0.0 08/25/2023 0847   BASOSABS 0.0 01/28/2023 1153    CMP    Component Value Date/Time   NA 134 (L) 07/16/2023 0424   K 4.3 07/16/2023 0424   CL 104 07/16/2023 0424   CO2 22 07/16/2023 0424   GLUCOSE 100 (H) 07/16/2023 0424   BUN 21 07/16/2023 0424   CREATININE 1.55 (H) 07/16/2023 0424   CREATININE 1.27 (H) 02/17/2019 1529   CALCIUM 9.2 07/16/2023 0424   PROT 6.2 (L) 07/14/2023 0615   ALBUMIN 3.6 07/14/2023 0615   AST 20 07/14/2023 0615   ALT 17 07/14/2023 0615   ALKPHOS 69  07/14/2023 0615   BILITOT 0.6 07/14/2023 0615   GFRNONAA 36 (L) 07/16/2023 0424   GFRNONAA 44 (L) 02/17/2019 1529   GFRAA 51 (L) 02/17/2019 1529    No results found for: "CEA1", "CEA" / No results found for: "CEA1", "CEA" No results found for: "PSA1" No results found for: "CAN199" No results found for: "CAN125"  Lab Results  Component Value Date   TOTALPROTELP 7.0 02/12/2023   TOTALPROTELP 7.0 02/12/2023   ALBUMINELP 4.0 02/12/2023   A1GS 0.2 02/12/2023   A2GS 0.7 02/12/2023   BETS 1.1 02/12/2023   GAMS 1.0 02/12/2023   MSPIKE Not Observed 02/12/2023   SPEI Comment 02/12/2023   Lab Results  Component Value Date   TIBC 347 08/25/2023   TIBC 287 07/08/2023   TIBC 265 04/17/2023   FERRITIN 745 (H) 08/25/2023   FERRITIN 337 (H) 07/08/2023   FERRITIN 366 (H) 04/17/2023   IRONPCTSAT 30 08/25/2023   IRONPCTSAT 46 (H) 07/08/2023   IRONPCTSAT 52 (H) 04/17/2023   Lab Results  Component Value Date   LDH 159 02/12/2023     STUDIES:   No results found.

## 2023-08-25 ENCOUNTER — Ambulatory Visit: Payer: Medicare Other | Admitting: Nurse Practitioner

## 2023-08-25 ENCOUNTER — Inpatient Hospital Stay: Payer: Medicare Other | Attending: Hematology | Admitting: Hematology

## 2023-08-25 ENCOUNTER — Inpatient Hospital Stay: Payer: Medicare Other

## 2023-08-25 VITALS — BP 98/71 | HR 71 | Temp 98.0°F | Resp 17 | Ht 62.0 in | Wt 150.7 lb

## 2023-08-25 DIAGNOSIS — Z7901 Long term (current) use of anticoagulants: Secondary | ICD-10-CM | POA: Diagnosis not present

## 2023-08-25 DIAGNOSIS — R197 Diarrhea, unspecified: Secondary | ICD-10-CM

## 2023-08-25 DIAGNOSIS — Z8379 Family history of other diseases of the digestive system: Secondary | ICD-10-CM | POA: Diagnosis not present

## 2023-08-25 DIAGNOSIS — R7989 Other specified abnormal findings of blood chemistry: Secondary | ICD-10-CM | POA: Insufficient documentation

## 2023-08-25 DIAGNOSIS — Z86711 Personal history of pulmonary embolism: Secondary | ICD-10-CM | POA: Insufficient documentation

## 2023-08-25 DIAGNOSIS — N1832 Chronic kidney disease, stage 3b: Secondary | ICD-10-CM | POA: Diagnosis not present

## 2023-08-25 DIAGNOSIS — Z882 Allergy status to sulfonamides status: Secondary | ICD-10-CM | POA: Insufficient documentation

## 2023-08-25 DIAGNOSIS — Z818 Family history of other mental and behavioral disorders: Secondary | ICD-10-CM | POA: Diagnosis not present

## 2023-08-25 DIAGNOSIS — G4730 Sleep apnea, unspecified: Secondary | ICD-10-CM | POA: Diagnosis not present

## 2023-08-25 DIAGNOSIS — Z90710 Acquired absence of both cervix and uterus: Secondary | ICD-10-CM | POA: Insufficient documentation

## 2023-08-25 DIAGNOSIS — M2550 Pain in unspecified joint: Secondary | ICD-10-CM

## 2023-08-25 DIAGNOSIS — D649 Anemia, unspecified: Secondary | ICD-10-CM | POA: Insufficient documentation

## 2023-08-25 DIAGNOSIS — Z79899 Other long term (current) drug therapy: Secondary | ICD-10-CM | POA: Diagnosis not present

## 2023-08-25 DIAGNOSIS — D6859 Other primary thrombophilia: Secondary | ICD-10-CM | POA: Insufficient documentation

## 2023-08-25 DIAGNOSIS — Z8249 Family history of ischemic heart disease and other diseases of the circulatory system: Secondary | ICD-10-CM | POA: Insufficient documentation

## 2023-08-25 DIAGNOSIS — R233 Spontaneous ecchymoses: Secondary | ICD-10-CM | POA: Diagnosis not present

## 2023-08-25 DIAGNOSIS — K219 Gastro-esophageal reflux disease without esophagitis: Secondary | ICD-10-CM | POA: Diagnosis not present

## 2023-08-25 DIAGNOSIS — F419 Anxiety disorder, unspecified: Secondary | ICD-10-CM | POA: Diagnosis not present

## 2023-08-25 DIAGNOSIS — Z885 Allergy status to narcotic agent status: Secondary | ICD-10-CM | POA: Insufficient documentation

## 2023-08-25 DIAGNOSIS — E039 Hypothyroidism, unspecified: Secondary | ICD-10-CM | POA: Insufficient documentation

## 2023-08-25 DIAGNOSIS — I2699 Other pulmonary embolism without acute cor pulmonale: Secondary | ICD-10-CM | POA: Diagnosis not present

## 2023-08-25 DIAGNOSIS — D509 Iron deficiency anemia, unspecified: Secondary | ICD-10-CM

## 2023-08-25 DIAGNOSIS — K5090 Crohn's disease, unspecified, without complications: Secondary | ICD-10-CM | POA: Diagnosis not present

## 2023-08-25 DIAGNOSIS — Z9049 Acquired absence of other specified parts of digestive tract: Secondary | ICD-10-CM | POA: Diagnosis not present

## 2023-08-25 LAB — CBC WITH DIFFERENTIAL/PLATELET
Abs Immature Granulocytes: 0.04 10*3/uL (ref 0.00–0.07)
Basophils Absolute: 0 10*3/uL (ref 0.0–0.1)
Basophils Relative: 1 %
Eosinophils Absolute: 0.3 10*3/uL (ref 0.0–0.5)
Eosinophils Relative: 5 %
HCT: 38.1 % (ref 36.0–46.0)
Hemoglobin: 13.5 g/dL (ref 12.0–15.0)
Immature Granulocytes: 1 %
Lymphocytes Relative: 34 %
Lymphs Abs: 1.6 10*3/uL (ref 0.7–4.0)
MCH: 33.9 pg (ref 26.0–34.0)
MCHC: 35.4 g/dL (ref 30.0–36.0)
MCV: 95.7 fL (ref 80.0–100.0)
Monocytes Absolute: 0.4 10*3/uL (ref 0.1–1.0)
Monocytes Relative: 9 %
Neutro Abs: 2.4 10*3/uL (ref 1.7–7.7)
Neutrophils Relative %: 50 %
Platelets: 212 10*3/uL (ref 150–400)
RBC: 3.98 MIL/uL (ref 3.87–5.11)
RDW: 12.5 % (ref 11.5–15.5)
WBC: 4.8 10*3/uL (ref 4.0–10.5)
nRBC: 0 % (ref 0.0–0.2)

## 2023-08-25 LAB — IRON AND TIBC
Iron: 105 ug/dL (ref 28–170)
Saturation Ratios: 30 % (ref 10.4–31.8)
TIBC: 347 ug/dL (ref 250–450)
UIBC: 242 ug/dL

## 2023-08-25 LAB — FOLATE: Folate: 34.1 ng/mL (ref >5.9)

## 2023-08-25 LAB — ANTITHROMBIN III: AntiThromb III Func: 95 % (ref 75–120)

## 2023-08-25 LAB — VITAMIN B12: Vitamin B-12: 392 pg/mL (ref 180–914)

## 2023-08-25 LAB — FERRITIN: Ferritin: 745 ng/mL — ABNORMAL HIGH (ref 11–307)

## 2023-08-25 NOTE — Patient Instructions (Signed)
[facility]  Discharge Instructions  You were seen and examined today by Dr. Ellin Saba. Dr. Ellin Saba is a hematologist, meaning that he specializes in blood abnormalities. Dr. Ellin Saba discussed your past medical history, family history of cancers/blood conditions and the events that led to you being here today.  You were referred to Dr. Ellin Saba due to a new found pulmonary embolism (blood clot in the lung).  Dr. Ellin Saba has recommended additional labs today for further evaluation.  Continue Eliquis as prescribed.  Follow-up as scheduled.  Thank you for choosing [facility] to provide your oncology and hematology care.   To afford each patient quality time with our provider, please arrive at least 15 minutes before your scheduled appointment time. You may need to reschedule your appointment if you arrive late (10 or more minutes). Arriving late affects you and other patients whose appointments are after yours.  Also, if you miss three or more appointments without notifying the office, you may be dismissed from the clinic at the provider's discretion.    Again, thank you for choosing [facility].  Our hope is that these requests will decrease the amount of time that you wait before being seen by our physicians.   If you have a lab appointment with the Cancer Center - please note that after April 8th, all labs will be drawn in the cancer center.  You do not have to check in or register with the main entrance as you have in the past but will complete your check-in at the cancer center.            _____________________________________________________________  Should you have questions after your visit to [facility], please contact our office at [phone] and follow the prompts.  Our office hours are 8:00 a.m. to 4:30 p.m. Monday - Thursday and 8:00 a.m. to 2:30 p.m. Friday.  Please note that  voicemails left after 4:00 p.m. may not be returned until the following business day.  We are closed weekends and all major holidays.  You do have access to a nurse 24-7, just call the main number to the clinic [phone] and do not press any options, hold on the line and a nurse will answer the phone.    For prescription refill requests, have your pharmacy contact our office and allow 72 hours.    Masks are no longer required in the cancer centers. If you would like for your care team to wear a mask while they are taking care of you, please let them know. You may have one support person who is at least 71 years old accompany you for your appointments.

## 2023-08-26 ENCOUNTER — Encounter: Payer: Self-pay | Admitting: Adult Health

## 2023-08-26 ENCOUNTER — Encounter: Payer: Self-pay | Admitting: Hematology

## 2023-08-26 ENCOUNTER — Ambulatory Visit (INDEPENDENT_AMBULATORY_CARE_PROVIDER_SITE_OTHER): Payer: Medicare Other | Admitting: Adult Health

## 2023-08-26 VITALS — BP 103/62 | HR 71 | Ht 62.0 in | Wt 152.0 lb

## 2023-08-26 DIAGNOSIS — I2699 Other pulmonary embolism without acute cor pulmonale: Secondary | ICD-10-CM | POA: Diagnosis not present

## 2023-08-26 DIAGNOSIS — R0609 Other forms of dyspnea: Secondary | ICD-10-CM

## 2023-08-26 DIAGNOSIS — K50019 Crohn's disease of small intestine with unspecified complications: Secondary | ICD-10-CM | POA: Diagnosis not present

## 2023-08-26 DIAGNOSIS — G4733 Obstructive sleep apnea (adult) (pediatric): Secondary | ICD-10-CM

## 2023-08-26 LAB — CARDIOLIPIN ANTIBODIES, IGG, IGM, IGA
Anticardiolipin IgA: <9 [APL'U]/mL (ref 0–11)
Anticardiolipin IgG: <9 [GPL'U]/mL (ref 0–14)
Anticardiolipin IgM: <9 [MPL'U]/mL (ref 0–12)

## 2023-08-26 LAB — PROTEIN S ACTIVITY: Protein S Activity: 113 % (ref 63–140)

## 2023-08-26 LAB — BETA-2-GLYCOPROTEIN I ABS, IGG/M/A
Beta-2 Glyco I IgG: <9 GPI IgG units (ref 0–20)
Beta-2-Glycoprotein I IgA: <9 GPI IgA units (ref 0–25)
Beta-2-Glycoprotein I IgM: <9 GPI IgM units (ref 0–32)

## 2023-08-26 LAB — DRVVT CONFIRM: dRVVT Confirm: 1.1 {ratio} (ref 0.8–1.2)

## 2023-08-26 LAB — PTT-LA MIX: PTT-LA Mix: 45.1 s — ABNORMAL HIGH (ref 0.0–40.5)

## 2023-08-26 LAB — PROTEIN C ACTIVITY: Protein C Activity: 116 % (ref 73–180)

## 2023-08-26 LAB — PROTEIN S, TOTAL: Protein S Ag, Total: 94 % (ref 60–150)

## 2023-08-26 LAB — LUPUS ANTICOAGULANT PANEL
DRVVT: 96 s — ABNORMAL HIGH (ref 0.0–47.0)
PTT Lupus Anticoagulant: 49.5 s — ABNORMAL HIGH (ref 0.0–43.5)

## 2023-08-26 LAB — DRVVT MIX: dRVVT Mix: 62.4 s — ABNORMAL HIGH (ref 0.0–40.4)

## 2023-08-26 LAB — HEXAGONAL PHASE PHOSPHOLIPID: Hexagonal Phase Phospholipid: 8 s (ref 0–11)

## 2023-08-26 NOTE — Patient Instructions (Addendum)
May try Dreamwear full face mask.  Restart BIPAP At bedtime, wear all night long  Saline nasal spray Twice daily   Saline nasal gel At bedtime    CT chest as planned.  Airsupra inhaler as needed.   Continue Eliquis Twice daily  Avoid all NSAIDs -Ibuprofen, aleve, motrin, excedrin.  Follow up with 6-8 weeks with PFT and As needed   Please contact office for sooner follow up if symptoms do not improve or worsen or seek emergency care

## 2023-08-26 NOTE — Progress Notes (Signed)
@Patient  ID: Jessica [name], female    DOB: Feb 01, 1951, 73 y.o.   MRN: [id number]  Chief Complaint  Patient presents with   Sleep Apnea    Has not been using CPAP ~11mos d/t illness and mult  hosp admits    Referring provider: Ignatius Specking, MD  HPI: 73 year old female followed for sleep apnea and dyspnea Diagnosed with acute PE January 2025 Medical history significant for Crohn's disease, early cirrhosis, chronic kidney disease stage III, moderate mitral valve regurg, previous right hemicolectomy with appendectomy   TEST/EVENTS :  10/2022 CPAP titration >> BiPAP 25/21 01/2021 HST showed mod  OSA with AHI 28/ hr 03/2021 TEE mod MR  08/26/2023 Follow up : OSA, PE , Dyspnea  Patient presents for a 1 month follow-up.  Over the last 3 months patient has had significant medical issues.  Initially seen in the emergency room in November 2020 for with suspected allergic reaction to [facility].  This was stopped.  She continued to have ongoing symptoms with nausea, fatigue, dizziness, body aches and shortness of breath.  Chest x-ray showed bilateral interstitial opacities.  She was treated for an atypical pneumonia with doxycycline and prednisone.  She then traveled in December to [place] via air travel and then an extended car travel.  She presented to the emergency room in early January with acute shortness of breath.  VQ scan was consistent with pulmonary embolism.  Felt to be possibly provoked due to her extensive air and road travel.  Echo showed EF preserved without right heart strain.  She was treated with IV heparin and discharged on Eliquis.  She has been referred to hematology and is undergoing a ongoing workup.  She says she is taking Eliquis without any known difficulties.  She does tell me that she does take nonsteroidals on occasion.  Patient education given to avoid all nonsteroidals. She is currently being evaluated by gastroenterology and is off Entyvio.  She says she will be going on another  medication in the near future for Crohn's. Since last visit cough is improved.  She continues to feel very tired and gets short of breath easily. Patient does have sleep apnea says that she has not been wearing her BiPAP recently says has been very uncomfortable for her.  Feels like the pressure is way too high.  She is currently on auto BiPAP IPAP 25 EPAP 8 and pressure support of 4. Patient was recommend to have a CT chest.  This is still pending   Allergies  Allergen Reactions   Morphine And Codeine Hives and Itching    May cause blood pressure to drop   Robaxin [Methocarbamol] Itching   Sulfa Antibiotics Itching    ALL SULFA DRUGS    Immunization History  Administered Date(s) Administered   Fluad Quad(high Dose 65+) 03/05/2021   Influenza,inj,quad, With Preservative 04/23/2017   Influenza-Unspecified 03/18/2019   Moderna SARS-COV2 Booster Vaccination 05/01/2020, 10/18/2020   Moderna Sars-Covid-2 Vaccination 08/25/2019, 09/22/2019, 03/28/2021   Zoster Recombinant(Shingrix) 04/14/2019    Past Medical History:  Diagnosis Date   Anxiety attack    Arthritis    Back pain, chronic    Chronic kidney disease    Crohn's colitis (HCC)    Depression (emotion)    Dysrhythmia    GERD (gastroesophageal reflux disease)    Hypertension    Hypothyroid    Iron deficiency anemia, unspecified    Neuropathy    Sleep apnea    cpap is not working per patient - needs  to find a new doctor - not used since 7/16 per pat    Stage 3 chronic kidney disease (HCC)    Vertigo    followed by Dr Angelena Sole- in [place]     Tobacco History: Social History   Tobacco Use  Smoking Status Never  Smokeless Tobacco Never   Counseling given: Not Answered   Outpatient Medications Prior to Visit  Medication Sig Dispense Refill   acetaminophen (TYLENOL) 325 MG tablet Take 2 tablets (650 mg total) by mouth every 6 (six) hours as needed for mild pain (pain score 1-3), fever or headache (or Fever >/= 101).      Albuterol-Budesonide (AIRSUPRA) 90-80 MCG/ACT AERO Inhale 2 puffs into the lungs every 8 (eight) hours.     amLODipine (NORVASC) 10 MG tablet Take 0.5 tablets (5 mg total) by mouth daily.     apixaban (ELIQUIS) 5 MG TABS tablet Take 1 tablet (5 mg total) by mouth 2 (two) times daily. 60 tablet 4   Azelastine HCl 137 MCG/SPRAY SOLN Place 2 sprays into both nostrils 2 (two) times daily.     buPROPion (WELLBUTRIN XL) 150 MG 24 hr tablet TAKE 1 TABLET BY MOUTH ONCE DAILY IN THE MORNING 30 tablet 0   dicyclomine (BENTYL) 10 MG capsule TAKE 1 CAPSULE BY MOUTH TWICE DAILY BEFORE A MEAL 60 capsule 0   ferrous sulfate 325 (65 FE) MG EC tablet Take 1 tablet (325 mg total) by mouth daily with breakfast. 90 tablet 3   gabapentin (NEURONTIN) 300 MG capsule TAKE 1 CAPSULE BY MOUTH THREE TIMES DAILY 90 capsule 0   HYDROcodone-acetaminophen (NORCO/VICODIN) 5-325 MG tablet Take 1 tablet by mouth every 6 (six) hours as needed for up to 20 doses for moderate pain (pain score 4-6). 20 tablet 0   levothyroxine (SYNTHROID) 100 MCG tablet Take 100 mcg by mouth daily.     loperamide (IMODIUM) 2 MG capsule TAKE 1 CAPSULE BY MOUTH TWICE DAILY AS NEEDED FOR LOOSE STOOLS 180 capsule 1   metoprolol succinate (TOPROL-XL) 25 MG 24 hr tablet Take 1.5 tablets (37.5 mg total) by mouth daily. TAKE 1 & 1/2 (ONE & ONE-HALF) TABLETS BY MOUTH ONCE DAILY 45 tablet 3   Multiple Vitamin (MULTIVITAMIN WITH MINERALS) TABS tablet Take 1 tablet by mouth daily.     pantoprazole (PROTONIX) 40 MG tablet Take 1 tablet (40 mg total) by mouth 2 (two) times daily. 180 tablet 3   rOPINIRole (REQUIP) 1 MG tablet Take 1 mg by mouth at bedtime.     rosuvastatin (CRESTOR) 10 MG tablet Take 1 tablet (10 mg total) by mouth daily. 90 tablet 2   spironolactone (ALDACTONE) 50 MG tablet Take 50 mg by mouth daily.     [START ON 11/11/2023] Upadacitinib ER (RINVOQ) 15 MG TB24 Take 1 tablet (15 mg total) by mouth daily. Start after completing 12 weeks of Rinvoq 45  mg. (Patient not taking: Reported on 08/26/2023) 30 tablet 5   Upadacitinib ER (RINVOQ) 45 MG TB24 Take 1 tablet (45 mg total) by mouth daily. Take for 12 weeks then reduce to 15 mg once daily. (Patient not taking: Reported on 08/26/2023) 84 tablet 0   Apixaban Starter Pack, 10mg  and 5mg , (ELIQUIS DVT/PE STARTER PACK) Take as directed on package: start with two-5mg  tablets twice daily for 7 days. On day 8, switch to one-5mg  tablet twice daily. 1 each 0   predniSONE (DELTASONE) 10 MG tablet Take 2 tabs daily with food x 10ds, then 1 tab daily with food  x 10ds then STOP 40 tablet 0   No facility-administered medications prior to visit.     Review of Systems:   Constitutional:   No  weight loss, night sweats,  Fevers, chills, + fatigue, or  lassitude.  HEENT:   No headaches,  Difficulty swallowing,  Tooth/dental problems, or  Sore throat,                No sneezing, itching, ear ache, nasal congestion, post nasal drip,   CV:  No chest pain,  Orthopnea, PND, swelling in lower extremities, anasarca, dizziness, palpitations, syncope.   GI  No heartburn, indigestion, abdominal pain, nausea, vomiting, diarrhea, change in bowel habits, loss of appetite, bloody stools.   Resp:  No wheezing.  No chest wall deformity  Skin: no rash or lesions.  GU: no dysuria, change in color of urine, no urgency or frequency.  No flank pain, no hematuria   MS:  No joint pain or swelling.  No decreased range of motion.  No back pain.    Physical Exam  BP 103/62   Pulse 71   Ht 5\' 2"  (1.575 m)   Wt 152 lb (68.9 kg)   SpO2 96%   BMI 27.80 kg/m   GEN: A/Ox3; pleasant , NAD, well nourished    HEENT:  [place]/AT,   NOSE-clear, THROAT-clear, no lesions, no postnasal drip or exudate noted.   NECK:  Supple w/ fair ROM; no JVD; normal carotid impulses w/o bruits; no thyromegaly or nodules palpated; no lymphadenopathy.    RESP  Clear  P & A; w/o, wheezes/ rales/ or rhonchi. no accessory muscle use, no dullness to  percussion  CARD:  RRR, no m/r/g, no peripheral edema, pulses intact, no cyanosis or clubbing.  GI:   Soft & nt; nml bowel sounds; no organomegaly or masses detected.   Musco: Warm bil, no deformities or joint swelling noted.   Neuro: alert, no focal deficits noted.    Skin: Warm, no lesions or rashes    Lab Results:  CBC    Component Value Date/Time   WBC 4.8 08/25/2023 0847   RBC 3.98 08/25/2023 0847   HGB 13.5 08/25/2023 0847   HGB 10.8 (L) 01/28/2023 1153   HCT 38.1 08/25/2023 0847   HCT 32.7 (L) 01/28/2023 1153   PLT 212 08/25/2023 0847   PLT 138 (L) 01/28/2023 1153   MCV 95.7 08/25/2023 0847   MCV 98 (H) 01/28/2023 1153   MCH 33.9 08/25/2023 0847   MCHC 35.4 08/25/2023 0847   RDW 12.5 08/25/2023 0847   RDW 12.2 01/28/2023 1153   LYMPHSABS 1.6 08/25/2023 0847   LYMPHSABS 1.3 01/28/2023 1153   MONOABS 0.4 08/25/2023 0847   EOSABS 0.3 08/25/2023 0847   EOSABS 0.3 01/28/2023 1153   BASOSABS 0.0 08/25/2023 0847   BASOSABS 0.0 01/28/2023 1153    BMET    Component Value Date/Time   NA 134 (L) 07/16/2023 0424   K 4.3 07/16/2023 0424   CL 104 07/16/2023 0424   CO2 22 07/16/2023 0424   GLUCOSE 100 (H) 07/16/2023 0424   BUN 21 07/16/2023 0424   CREATININE 1.55 (H) 07/16/2023 0424   CREATININE 1.27 (H) 02/17/2019 1529   CALCIUM 9.2 07/16/2023 0424   GFRNONAA 36 (L) 07/16/2023 0424   GFRNONAA 44 (L) 02/17/2019 1529   GFRAA 51 (L) 02/17/2019 1529    BNP    Component Value Date/Time   BNP 20.0 07/14/2023 0615    ProBNP No results found for: "PROBNP"  Imaging: No results found.  acetaminophen (TYLENOL) tablet 650 mg     Date Action Dose Route User   07/23/2023 1150 Given 650 mg Oral Presnell, Brandy D, RN      cetirizine (QUZYTTIR) injection 10 mg     Date Action Dose Route User   07/23/2023 1151 Given 10 mg Intravenous Presnell, Brandy D, RN      ferric derisomaltose (MONOFERRIC) 1,000 mg in sodium chloride 0.9 % 100 mL infusion     Date  Action Dose Route User   07/23/2023 1243 Infusion Verify (none) Intravenous Lala Lund, RN   07/23/2023 1242 Infusion Verify (none) Intravenous Lala Lund, RN   07/23/2023 1225 New Bag/Given 1,000 mg Intravenous Toniann Fail B, RN      0.9 %  sodium chloride infusion     Date Action Dose Route User   07/23/2023 1302 Rate/Dose Change (none) Intravenous Lala Lund, RN   07/23/2023 1224 Rate/Dose Change (none) Intravenous Lala Lund, RN   07/23/2023 1154 Infusion Verify (none) Intravenous Lala Lund, RN   07/23/2023 1148 Rate/Dose Change (none) Intravenous Lala Lund, RN   07/23/2023 1147 Rate/Dose Change (none) Intravenous Handy, Tandra C, RN           No data to display          No results found for: "NITRICOXIDE"      Assessment & Plan:   OSA (obstructive sleep apnea) Moderate obstructive sleep apnea on BiPAP.  Patient's been having trouble tolerating BiPAP.  Will adjust BiPAP pressure for comfort.  Changed to auto BiPAP decrease IPAP max to 20 cm H2O.  Continue EPAP minimum 8 cm H2O.  Repeat download on return visit.  Pulmonary embolism (HCC) Recently diagnosed PE in January 2025 after prolonged travel and acute illness.  Continue on Eliquis.  Continue follow-up with hematology and recommendation for length of therapy.  Advised on avoidance of all nonsteroidals medications  Crohn's disease of small intestine (HCC) Continue follow-up with GI  Dyspnea on exertion Dyspnea questionable etiology suspect is multifactorial in nature with recent PE, and resolving pneumonia.-Will check PFTs on return visit.  CT chest is pending.  O2 saturations are adequate today in the office.  Previous echo last month during hospitalization for PE showed no right heart strain. Questionable component of reactive airways.  Airsupra as needed  Plan  Patient Instructions  May try Dreamwear full face mask.  Restart BIPAP At bedtime, wear all night long  Saline nasal  spray Twice daily   Saline nasal gel At bedtime    CT chest as planned.  Airsupra inhaler as needed.   Continue Eliquis Twice daily  Avoid all NSAIDs -Ibuprofen, aleve, motrin, excedrin.  Follow up with 6-8 weeks with PFT and As needed   Please contact office for sooner follow up if symptoms do not improve or worsen or seek emergency care            Rubye Oaks, NP 08/26/2023

## 2023-08-26 NOTE — Assessment & Plan Note (Signed)
 Continue follow up with GI

## 2023-08-26 NOTE — Assessment & Plan Note (Signed)
Recently diagnosed PE in January 2025 after prolonged travel and acute illness.  Continue on Eliquis.  Continue follow-up with hematology and recommendation for length of therapy.  Advised on avoidance of all nonsteroidals medications

## 2023-08-26 NOTE — Assessment & Plan Note (Addendum)
Dyspnea questionable etiology suspect is multifactorial in nature with recent PE, and resolving pneumonia.-Will check PFTs on return visit.  CT chest is pending.  O2 saturations are adequate today in the office.  Previous echo last month during hospitalization for PE showed no right heart strain. Questionable component of reactive airways.  Airsupra as needed  Plan  Patient Instructions  May try Dreamwear full face mask.  Restart BIPAP At bedtime, wear all night long  Saline nasal spray Twice daily   Saline nasal gel At bedtime    CT chest as planned.  Airsupra inhaler as needed.   Continue Eliquis Twice daily  Avoid all NSAIDs -Ibuprofen, aleve, motrin, excedrin.  Follow up with 6-8 weeks with PFT and As needed   Please contact office for sooner follow up if symptoms do not improve or worsen or seek emergency care

## 2023-08-26 NOTE — Assessment & Plan Note (Signed)
Moderate obstructive sleep apnea on BiPAP.  Patient's been having trouble tolerating BiPAP.  Will adjust BiPAP pressure for comfort.  Changed to auto BiPAP decrease IPAP max to 20 cm H2O.  Continue EPAP minimum 8 cm H2O.  Repeat download on return visit.

## 2023-08-27 DIAGNOSIS — Z471 Aftercare following joint replacement surgery: Secondary | ICD-10-CM | POA: Diagnosis not present

## 2023-08-27 DIAGNOSIS — Z96652 Presence of left artificial knee joint: Secondary | ICD-10-CM | POA: Diagnosis not present

## 2023-08-27 DIAGNOSIS — Z96651 Presence of right artificial knee joint: Secondary | ICD-10-CM | POA: Diagnosis not present

## 2023-08-27 LAB — PROTEIN C, TOTAL: Protein C, Total: 108 % (ref 60–150)

## 2023-08-28 DIAGNOSIS — N1832 Chronic kidney disease, stage 3b: Secondary | ICD-10-CM | POA: Diagnosis not present

## 2023-08-28 DIAGNOSIS — I129 Hypertensive chronic kidney disease with stage 1 through stage 4 chronic kidney disease, or unspecified chronic kidney disease: Secondary | ICD-10-CM | POA: Diagnosis not present

## 2023-08-28 DIAGNOSIS — D638 Anemia in other chronic diseases classified elsewhere: Secondary | ICD-10-CM | POA: Diagnosis not present

## 2023-08-28 DIAGNOSIS — N2581 Secondary hyperparathyroidism of renal origin: Secondary | ICD-10-CM | POA: Diagnosis not present

## 2023-09-01 LAB — PROTHROMBIN GENE MUTATION

## 2023-09-01 LAB — FACTOR 5 LEIDEN

## 2023-09-02 ENCOUNTER — Other Ambulatory Visit: Payer: Self-pay | Admitting: Gastroenterology

## 2023-09-02 ENCOUNTER — Ambulatory Visit (HOSPITAL_COMMUNITY)
Admission: RE | Admit: 2023-09-02 | Discharge: 2023-09-02 | Disposition: A | Discharge status: Home / Self Care | Payer: Medicare Other | Source: Ambulatory Visit | Attending: Pulmonary Disease | Admitting: Pulmonary Disease

## 2023-09-02 DIAGNOSIS — R053 Chronic cough: Secondary | ICD-10-CM | POA: Insufficient documentation

## 2023-09-03 NOTE — Telephone Encounter (Signed)
 Will defer to [facility] when she returns. Appears Courtney recommended holding dicyclomine at last visit.

## 2023-09-04 ENCOUNTER — Other Ambulatory Visit (HOSPITAL_COMMUNITY): Payer: Self-pay | Admitting: Psychiatry

## 2023-09-04 ENCOUNTER — Inpatient Hospital Stay: Payer: Medicare Other

## 2023-09-05 DIAGNOSIS — I10 Essential (primary) hypertension: Secondary | ICD-10-CM | POA: Diagnosis not present

## 2023-09-11 ENCOUNTER — Inpatient Hospital Stay: Payer: Medicare Other | Admitting: Oncology

## 2023-09-11 DIAGNOSIS — G894 Chronic pain syndrome: Secondary | ICD-10-CM | POA: Diagnosis not present

## 2023-09-11 DIAGNOSIS — M7912 Myalgia of auxiliary muscles, head and neck: Secondary | ICD-10-CM | POA: Diagnosis not present

## 2023-09-11 DIAGNOSIS — M7918 Myalgia, other site: Secondary | ICD-10-CM | POA: Diagnosis not present

## 2023-09-11 DIAGNOSIS — Z8673 Personal history of transient ischemic attack (TIA), and cerebral infarction without residual deficits: Secondary | ICD-10-CM | POA: Diagnosis not present

## 2023-09-11 DIAGNOSIS — G2581 Restless legs syndrome: Secondary | ICD-10-CM | POA: Diagnosis not present

## 2023-09-12 DIAGNOSIS — Z299 Encounter for prophylactic measures, unspecified: Secondary | ICD-10-CM | POA: Diagnosis not present

## 2023-09-12 DIAGNOSIS — K5090 Crohn's disease, unspecified, without complications: Secondary | ICD-10-CM | POA: Diagnosis not present

## 2023-09-12 DIAGNOSIS — G319 Degenerative disease of nervous system, unspecified: Secondary | ICD-10-CM | POA: Diagnosis not present

## 2023-09-12 DIAGNOSIS — J309 Allergic rhinitis, unspecified: Secondary | ICD-10-CM | POA: Diagnosis not present

## 2023-09-12 DIAGNOSIS — I10 Essential (primary) hypertension: Secondary | ICD-10-CM | POA: Diagnosis not present

## 2023-09-15 DIAGNOSIS — I10 Essential (primary) hypertension: Secondary | ICD-10-CM | POA: Diagnosis not present

## 2023-09-15 DIAGNOSIS — N1831 Chronic kidney disease, stage 3a: Secondary | ICD-10-CM | POA: Diagnosis not present

## 2023-09-15 DIAGNOSIS — Z299 Encounter for prophylactic measures, unspecified: Secondary | ICD-10-CM | POA: Diagnosis not present

## 2023-09-15 DIAGNOSIS — K5010 Crohn's disease of large intestine without complications: Secondary | ICD-10-CM | POA: Diagnosis not present

## 2023-09-15 DIAGNOSIS — I2699 Other pulmonary embolism without acute cor pulmonale: Secondary | ICD-10-CM | POA: Diagnosis not present

## 2023-09-16 NOTE — Telephone Encounter (Signed)
 I have sent request to Dr. Ellin Saba

## 2023-09-16 NOTE — Telephone Encounter (Signed)
   Name: SRAH AKE  DOB: 1951/02/07  MRN: [id number]   Primary Cardiologist: Dina Rich, MD  Chart reviewed as part of pre-operative protocol coverage.   Please see notes from Sharlene Dory, NP.  Patient may proceed at acceptable cardiovascular risk.  Recommendations regarding anticoagulation should come from hematology as noted.  Please call with questions.  Tereso Newcomer, PA-C 09/16/2023, 10:59 AM

## 2023-09-16 NOTE — Telephone Encounter (Addendum)
 Notes sent from Sharlene Dory, NP   Ms. Hutt's perioperative risk of a major cardiac event is 0.9% according to the Revised Cardiac Risk Index (RCRI).  Therefore, she is at low risk for perioperative complications.   Her functional capacity is good at 5.07 METs according to the Duke Activity Status Index (DASI).  Recommendations: According to ACC/AHA guidelines, no further cardiovascular testing needed.  The patient may proceed to surgery at acceptable risk.    Antiplatelet and/or Anticoagulation Recommendations: Eliquis is being managed by Hematology. Recommend recs for holding Eliquis pre-operatively come from her Hematologist. I will forward this to Hem/Onc for further recs.   Sharlene Dory, NP

## 2023-09-16 NOTE — Telephone Encounter (Signed)
 Thanks, did not see this, Ok to proceed then

## 2023-09-16 NOTE — Telephone Encounter (Signed)
 Please advise regarding the above request.

## 2023-09-16 NOTE — Telephone Encounter (Signed)
 I may have missed this in your note Tammy, but I do not see any mention regarding clearance to hold Eliquis for 48 hours. Please comment about this so we can proceed with procedure.  Thanks

## 2023-09-16 NOTE — Telephone Encounter (Addendum)
 Notes sent to surgeon. This phone note will be removed from the preop pool. Tereso Newcomer, PA-C  09/16/2023 11:01 AM

## 2023-09-16 NOTE — Telephone Encounter (Signed)
 I cannot find that note, if you found it please copy and past the paragraph and date in this thread. Thanks

## 2023-09-16 NOTE — Progress Notes (Signed)
 Ms. Cortinas perioperative risk of a major cardiac event is 0.9% according to the Revised Cardiac Risk Index (RCRI).  Therefore, she is at low risk for perioperative complications.   Her functional capacity is good at 5.07 METs according to the Duke Activity Status Index (DASI). Recommendations: According to ACC/AHA guidelines, no further cardiovascular testing needed.  The patient may proceed to surgery at acceptable risk.   Antiplatelet and/or Anticoagulation Recommendations: Eliquis is being managed by Hematology. Recommend recs for holding Eliquis pre-operatively come from her Hematologist.   Sharlene Dory, NP

## 2023-09-22 ENCOUNTER — Encounter: Payer: Self-pay | Admitting: *Deleted

## 2023-09-22 ENCOUNTER — Encounter (HOSPITAL_BASED_OUTPATIENT_CLINIC_OR_DEPARTMENT_OTHER): Payer: Self-pay | Admitting: Pulmonary Disease

## 2023-09-23 ENCOUNTER — Ambulatory Visit: Payer: Medicare Other | Admitting: Adult Health

## 2023-09-24 DIAGNOSIS — I10 Essential (primary) hypertension: Secondary | ICD-10-CM | POA: Diagnosis not present

## 2023-09-24 DIAGNOSIS — J029 Acute pharyngitis, unspecified: Secondary | ICD-10-CM | POA: Diagnosis not present

## 2023-09-24 DIAGNOSIS — Z299 Encounter for prophylactic measures, unspecified: Secondary | ICD-10-CM | POA: Diagnosis not present

## 2023-09-25 ENCOUNTER — Inpatient Hospital Stay: Payer: Medicare Other | Admitting: Oncology

## 2023-09-29 ENCOUNTER — Other Ambulatory Visit: Payer: Self-pay

## 2023-09-29 ENCOUNTER — Encounter (HOSPITAL_COMMUNITY): Payer: Self-pay

## 2023-09-29 DIAGNOSIS — D509 Iron deficiency anemia, unspecified: Secondary | ICD-10-CM

## 2023-09-29 DIAGNOSIS — D649 Anemia, unspecified: Secondary | ICD-10-CM

## 2023-09-29 DIAGNOSIS — I2699 Other pulmonary embolism without acute cor pulmonale: Secondary | ICD-10-CM

## 2023-09-30 ENCOUNTER — Other Ambulatory Visit (HOSPITAL_COMMUNITY): Payer: Self-pay | Admitting: Psychiatry

## 2023-09-30 ENCOUNTER — Inpatient Hospital Stay: Attending: Hematology

## 2023-09-30 ENCOUNTER — Encounter (HOSPITAL_COMMUNITY)
Admission: RE | Admit: 2023-09-30 | Discharge: 2023-09-30 | Disposition: A | Discharge status: Home / Self Care | Source: Ambulatory Visit | Attending: Gastroenterology | Admitting: Gastroenterology

## 2023-09-30 NOTE — Telephone Encounter (Signed)
 Call for appt, last seen 9/24

## 2023-10-01 ENCOUNTER — Other Ambulatory Visit: Payer: Self-pay | Admitting: Gastroenterology

## 2023-10-02 ENCOUNTER — Ambulatory Visit (HOSPITAL_COMMUNITY)
Admission: RE | Admit: 2023-10-02 | Discharge: 2023-10-02 | Disposition: A | Discharge status: Home / Self Care | Payer: Medicare Other | Source: Ambulatory Visit | Attending: Adult Health | Admitting: Adult Health

## 2023-10-02 DIAGNOSIS — I2699 Other pulmonary embolism without acute cor pulmonale: Secondary | ICD-10-CM | POA: Insufficient documentation

## 2023-10-02 DIAGNOSIS — R0609 Other forms of dyspnea: Secondary | ICD-10-CM | POA: Insufficient documentation

## 2023-10-02 LAB — PULMONARY FUNCTION TEST
DL/VA % pred: 96 %
DL/VA: 4.09 ml/min/mmHg/L
DLCO unc % pred: 72 %
DLCO unc: 12.91 ml/min/mmHg
FEF 25-75 Post: 1.75 L/s
FEF 25-75 Pre: 1.8 L/s
FEF2575-%Change-Post: -3 %
FEF2575-%Pred-Post: 100 %
FEF2575-%Pred-Pre: 103 %
FEV1-%Change-Post: -2 %
FEV1-%Pred-Post: 84 %
FEV1-%Pred-Pre: 86 %
FEV1-Post: 1.73 L
FEV1-Pre: 1.78 L
FEV1FVC-%Change-Post: 7 %
FEV1FVC-%Pred-Pre: 108 %
FEV6-%Change-Post: -9 %
FEV6-%Pred-Post: 75 %
FEV6-%Pred-Pre: 83 %
FEV6-Post: 1.96 L
FEV6-Pre: 2.16 L
FEV6FVC-%Change-Post: 0 %
FEV6FVC-%Pred-Post: 104 %
FEV6FVC-%Pred-Pre: 104 %
FVC-%Change-Post: -9 %
FVC-%Pred-Post: 72 %
FVC-%Pred-Pre: 79 %
FVC-Post: 1.96 L
FVC-Pre: 2.16 L
Post FEV1/FVC ratio: 88 %
Post FEV6/FVC ratio: 100 %
Pre FEV1/FVC ratio: 82 %
Pre FEV6/FVC Ratio: 100 %
RV % pred: 81 %
RV: 1.73 L
TLC % pred: 86 %
TLC: 4.13 L

## 2023-10-02 MED ORDER — ALBUTEROL SULFATE (2.5 MG/3ML) 0.083% IN NEBU
2.5000 mg | INHALATION_SOLUTION | Freq: Once | RESPIRATORY_TRACT | Status: AC
  Administered 2023-10-02: 2.5 mg via RESPIRATORY_TRACT

## 2023-10-05 ENCOUNTER — Other Ambulatory Visit: Payer: Self-pay | Admitting: Cardiology

## 2023-10-05 DIAGNOSIS — I10 Essential (primary) hypertension: Secondary | ICD-10-CM | POA: Diagnosis not present

## 2023-10-06 NOTE — Anesthesia Preprocedure Evaluation (Signed)
 Anesthesia Evaluation  Patient identified by MRN, date of birth, ID band Patient awake    Reviewed: Allergy & Precautions, H&P , NPO status , Patient's Chart, lab work & pertinent test results, reviewed documented beta blocker date and time   Airway Mallampati: II  TM Distance: >3 FB Neck ROM: full    Dental no notable dental hx. (+) Dental Advisory Given, Teeth Intact   Pulmonary sleep apnea    Pulmonary exam normal breath sounds clear to auscultation       Cardiovascular Exercise Tolerance: Good hypertension, negative cardio ROS Normal cardiovascular exam+ dysrhythmias  Rhythm:regular Rate:Normal     Neuro/Psych  PSYCHIATRIC DISORDERS Anxiety Depression     Neuromuscular disease  negative psych ROS   GI/Hepatic Neg liver ROS,GERD  ,,Crohn's   Endo/Other  Hypothyroidism    Renal/GU Renal diseaseStage 3 CKD  negative genitourinary   Musculoskeletal   Abdominal   Peds  Hematology  (+) Blood dyscrasia, anemia   Anesthesia Other Findings   Reproductive/Obstetrics negative OB ROS                             Anesthesia Physical Anesthesia Plan  ASA: 3  Anesthesia Plan: General   Post-op Pain Management: Minimal or no pain anticipated   Induction: Intravenous  PONV Risk Score and Plan: Propofol infusion  Airway Management Planned: Nasal Cannula and Natural Airway  Additional Equipment: None  Intra-op Plan:   Post-operative Plan:   Informed Consent: I have reviewed the patients History and Physical, chart, labs and discussed the procedure including the risks, benefits and alternatives for the proposed anesthesia with the patient or authorized representative who has indicated his/her understanding and acceptance.     Dental Advisory Given  Plan Discussed with: CRNA  Anesthesia Plan Comments:        Anesthesia Quick Evaluation

## 2023-10-07 ENCOUNTER — Ambulatory Visit (HOSPITAL_COMMUNITY): Payer: Self-pay | Admitting: Anesthesiology

## 2023-10-07 ENCOUNTER — Ambulatory Visit (HOSPITAL_COMMUNITY)
Admission: RE | Admit: 2023-10-07 | Discharge: 2023-10-07 | Disposition: A | Discharge status: Home / Self Care | Attending: Gastroenterology | Admitting: Gastroenterology

## 2023-10-07 ENCOUNTER — Encounter (HOSPITAL_COMMUNITY)
Admission: RE | Disposition: A | Discharge status: Home / Self Care | Payer: Self-pay | Source: Home / Self Care | Attending: Gastroenterology

## 2023-10-07 ENCOUNTER — Ambulatory Visit (HOSPITAL_BASED_OUTPATIENT_CLINIC_OR_DEPARTMENT_OTHER): Payer: Self-pay | Admitting: Anesthesiology

## 2023-10-07 DIAGNOSIS — D631 Anemia in chronic kidney disease: Secondary | ICD-10-CM | POA: Insufficient documentation

## 2023-10-07 DIAGNOSIS — G4733 Obstructive sleep apnea (adult) (pediatric): Secondary | ICD-10-CM | POA: Insufficient documentation

## 2023-10-07 DIAGNOSIS — G4730 Sleep apnea, unspecified: Secondary | ICD-10-CM | POA: Diagnosis not present

## 2023-10-07 DIAGNOSIS — E039 Hypothyroidism, unspecified: Secondary | ICD-10-CM | POA: Diagnosis not present

## 2023-10-07 DIAGNOSIS — Z79899 Other long term (current) drug therapy: Secondary | ICD-10-CM | POA: Diagnosis not present

## 2023-10-07 DIAGNOSIS — K2289 Other specified disease of esophagus: Secondary | ICD-10-CM

## 2023-10-07 DIAGNOSIS — K5080 Crohn's disease of both small and large intestine without complications: Secondary | ICD-10-CM | POA: Insufficient documentation

## 2023-10-07 DIAGNOSIS — N183 Chronic kidney disease, stage 3 unspecified: Secondary | ICD-10-CM | POA: Diagnosis not present

## 2023-10-07 DIAGNOSIS — F32A Depression, unspecified: Secondary | ICD-10-CM | POA: Diagnosis not present

## 2023-10-07 DIAGNOSIS — K2100 Gastro-esophageal reflux disease with esophagitis, without bleeding: Secondary | ICD-10-CM | POA: Insufficient documentation

## 2023-10-07 DIAGNOSIS — T182XXA Foreign body in stomach, initial encounter: Secondary | ICD-10-CM

## 2023-10-07 DIAGNOSIS — K219 Gastro-esophageal reflux disease without esophagitis: Secondary | ICD-10-CM | POA: Diagnosis not present

## 2023-10-07 DIAGNOSIS — R12 Heartburn: Secondary | ICD-10-CM | POA: Insufficient documentation

## 2023-10-07 DIAGNOSIS — R1310 Dysphagia, unspecified: Secondary | ICD-10-CM

## 2023-10-07 DIAGNOSIS — F419 Anxiety disorder, unspecified: Secondary | ICD-10-CM | POA: Insufficient documentation

## 2023-10-07 DIAGNOSIS — I129 Hypertensive chronic kidney disease with stage 1 through stage 4 chronic kidney disease, or unspecified chronic kidney disease: Secondary | ICD-10-CM | POA: Insufficient documentation

## 2023-10-07 DIAGNOSIS — Z79890 Hormone replacement therapy (postmenopausal): Secondary | ICD-10-CM | POA: Insufficient documentation

## 2023-10-07 DIAGNOSIS — Z7901 Long term (current) use of anticoagulants: Secondary | ICD-10-CM | POA: Insufficient documentation

## 2023-10-07 HISTORY — PX: ESOPHAGOGASTRODUODENOSCOPY: SHX5428

## 2023-10-07 LAB — COMPREHENSIVE METABOLIC PANEL WITH GFR
ALT: 54 U/L — ABNORMAL HIGH (ref 0–44)
AST: 58 U/L — ABNORMAL HIGH (ref 15–41)
Albumin: 3.5 g/dL (ref 3.5–5.0)
Alkaline Phosphatase: 65 U/L (ref 38–126)
Anion gap: 5 (ref 5–15)
BUN: 21 mg/dL (ref 8–23)
CO2: 19 mmol/L — ABNORMAL LOW (ref 22–32)
Calcium: 8.3 mg/dL — ABNORMAL LOW (ref 8.9–10.3)
Chloride: 107 mmol/L (ref 98–111)
Creatinine, Ser: 1.29 mg/dL — ABNORMAL HIGH (ref 0.44–1.00)
GFR, Estimated: 44 mL/min — ABNORMAL LOW
Glucose, Bld: 110 mg/dL — ABNORMAL HIGH (ref 70–99)
Potassium: 3.6 mmol/L (ref 3.5–5.1)
Sodium: 131 mmol/L — ABNORMAL LOW (ref 135–145)
Total Bilirubin: 1 mg/dL (ref 0.0–1.2)
Total Protein: 5.8 g/dL — ABNORMAL LOW (ref 6.5–8.1)

## 2023-10-07 LAB — CBC
HCT: 23.4 % — ABNORMAL LOW (ref 36.0–46.0)
Hemoglobin: 8.3 g/dL — ABNORMAL LOW (ref 12.0–15.0)
MCH: 33.2 pg (ref 26.0–34.0)
MCHC: 35.5 g/dL (ref 30.0–36.0)
MCV: 93.6 fL (ref 80.0–100.0)
Platelets: 99 10*3/uL — ABNORMAL LOW (ref 150–400)
RBC: 2.5 MIL/uL — ABNORMAL LOW (ref 3.87–5.11)
RDW: 12.4 % (ref 11.5–15.5)
WBC: 3.7 10*3/uL — ABNORMAL LOW (ref 4.0–10.5)
nRBC: 0 % (ref 0.0–0.2)

## 2023-10-07 LAB — LIPID PANEL
Cholesterol: 109 mg/dL (ref 0–200)
HDL: 42 mg/dL (ref >40)
LDL Cholesterol: 43 mg/dL (ref 0–99)
Total CHOL/HDL Ratio: 2.6 ratio
Triglycerides: 120 mg/dL (ref <150)
VLDL: 24 mg/dL (ref 0–40)

## 2023-10-07 SURGERY — EGD (ESOPHAGOGASTRODUODENOSCOPY)
Anesthesia: General

## 2023-10-07 MED ORDER — LACTATED RINGERS IV SOLN: INTRAVENOUS | Status: DC | PRN

## 2023-10-07 MED ORDER — PROPOFOL 10 MG/ML IV BOLUS
INTRAVENOUS | Status: DC | PRN
  Administered 2023-10-07: 100 mg via INTRAVENOUS

## 2023-10-07 MED ORDER — PROPOFOL 500 MG/50ML IV EMUL
INTRAVENOUS | Status: DC | PRN
  Administered 2023-10-07: 150 ug/kg/min via INTRAVENOUS

## 2023-10-07 MED ORDER — LIDOCAINE HCL (PF) 2 % IJ SOLN
INTRAMUSCULAR | Status: DC | PRN
  Administered 2023-10-07: 100 mg via INTRADERMAL

## 2023-10-07 MED ORDER — OMEPRAZOLE 40 MG PO CPDR: 40.0000 mg | DELAYED_RELEASE_CAPSULE | Freq: Every day | ORAL | 3 refills | Status: DC

## 2023-10-07 NOTE — Anesthesia Postprocedure Evaluation (Signed)
 Anesthesia Post Note  Patient: Jessica [name]  Procedure(s) Performed: EGD (ESOPHAGOGASTRODUODENOSCOPY)  Patient location during evaluation: PACU Anesthesia Type: General Level of consciousness: awake and alert Pain management: pain level controlled Vital Signs Assessment: post-procedure vital signs reviewed and stable Respiratory status: spontaneous breathing, nonlabored ventilation, respiratory function stable and patient connected to nasal cannula oxygen Cardiovascular status: blood pressure returned to baseline and stable Postop Assessment: no apparent nausea or vomiting Anesthetic complications: no   There were no known notable events for this encounter.   Last Vitals:  Vitals:   10/07/23 0709 10/07/23 0750  BP: 128/66 (!) 103/50  Pulse: 89 91  Resp: 18 16  Temp: 37.1 C (!) 38.1 C  SpO2: 100% 100%    Last Pain:  Vitals:   10/07/23 0750  TempSrc: Oral  PainSc: 0-No pain                 Fadel Clason L Riven Mabile

## 2023-10-07 NOTE — Anesthesia Procedure Notes (Signed)
 Date/Time: 10/07/2023 7:34 AM  Performed by: Julian Reil, CRNAPre-anesthesia Checklist: Patient identified, Emergency Drugs available, Suction available and Patient being monitored Patient Re-evaluated:Patient Re-evaluated prior to induction Oxygen Delivery Method: Nasal cannula Induction Type: IV induction Placement Confirmation: positive ETCO2 Comments: Optiflow High Flow [place] O2 used.

## 2023-10-07 NOTE — H&P (Signed)
 Jessica [name] is an 73 y.o. female.   Chief Complaint: heartburn and dysphagia HPI: Jessica [name] is a 73 y.o. female with a history of small bowel and colonic Crohn's with right hemicolectomy (previously on Lialda), history of pinhole fistula in 2019, HTN, chronic GERD, hypothyroidism, CKD stage III, and OSA coming for evaluation of heartburn and dysphagia.   States having frequent heartburn 2-3 times a week despite taking pantoprazole compliantly twice a day.  Also reports having intermittent dysphagia to solids.  No fever, chills, nausea or vomiting.  Past Medical History:  Diagnosis Date   Anxiety attack    Arthritis    Back pain, chronic    Chronic kidney disease    Crohn's colitis (HCC)    Depression (emotion)    Dysrhythmia    GERD (gastroesophageal reflux disease)    Hypertension    Hypothyroid    Iron deficiency anemia, unspecified    Neuropathy    Sleep apnea    cpap is not working per patient - needs to find a new doctor - not used since 7/16 per pat    Stage 3 chronic kidney disease (HCC)    Vertigo    followed by Dr Angelena Sole- in [place]     Past Surgical History:  Procedure Laterality Date   ABDOMINAL HYSTERECTOMY     BIOPSY  06/08/2018   Procedure: BIOPSY;  Surgeon: Malissa Hippo, MD;  Location: [facility];  Service: Endoscopy;;  (colon)   BIOPSY  04/01/2023   Procedure: BIOPSY;  Surgeon: Dolores Frame, MD;  Location: [facility];  Service: Gastroenterology;;   CHOLECYSTECTOMY     COLON SURGERY     for Crohn's in the 75s in [place]   COLONOSCOPY N/A 03/11/2013   Procedure: COLONOSCOPY;  Surgeon: Malissa Hippo, MD;  [facility]: [facility];  Service: Endoscopy;  Laterality: N/A;  1200   COLONOSCOPY N/A 06/08/2018   Procedure: COLONOSCOPY;  Surgeon: Malissa Hippo, MD;  [facility]: [facility];  Service: Endoscopy;  Laterality: N/A;  8:25   COLONOSCOPY WITH PROPOFOL N/A 04/01/2023   Procedure: COLONOSCOPY WITH PROPOFOL;  Surgeon: Dolores Frame, MD;  [facility]: [facility];  Service: Gastroenterology;  Laterality: N/A;   ESOPHAGEAL BRUSHING  04/01/2023   Procedure: ESOPHAGEAL BRUSHING;  Surgeon: Marguerita Merles, Reuel Boom, MD;  [facility]: [facility];  Service: Gastroenterology;;   ESOPHAGOGASTRODUODENOSCOPY (EGD) WITH PROPOFOL N/A 04/01/2023   Procedure: ESOPHAGOGASTRODUODENOSCOPY (EGD) WITH PROPOFOL;  Surgeon: Dolores Frame, MD;  [facility]: [facility];  Service: Gastroenterology;  Laterality: N/A;  1:00am;asa 3   fatty tumor removed     HARDWARE REMOVAL Right 08/07/2016   Procedure: HARDWARE REMOVAL;  Surgeon: Ollen Gross, MD;  Location: [facility];  Service: Orthopedics;  Laterality: Right;   Hip replacement      Rt hip in 2010 in [place]   KNEE ARTHROSCOPY Left 03/29/2015   Procedure: ARTHROSCOPY LEFT KNEE WITH MENSICAL DEBRIDEMENT, chondroplasty;  Surgeon: Ollen Gross, MD;  Location: [facility];  Service: Orthopedics;  Laterality: Left;   ORIF TIBIA PLATEAU Right 02/29/2016   Procedure: OPEN REDUCTION INTERNAL FIXATION (ORIF RIGHT  TIBIAL PLATEAU FRACTURE;  Surgeon: Ollen Gross, MD;  Location: [facility];  Service: Orthopedics;  Laterality: Right;   POLYPECTOMY  04/01/2023   Procedure: POLYPECTOMY;  Surgeon: Dolores Frame, MD;  [facility];  Service: Gastroenterology;;   TEE WITHOUT CARDIOVERSION N/A 03/27/2021   Procedure: TRANSESOPHAGEAL ECHOCARDIOGRAM (TEE);  Surgeon: Antoine Poche, MD;  [facility];  Service: Endoscopy;  Laterality: N/A;   TOTAL KNEE ARTHROPLASTY Right 08/25/2017   Procedure: TOTAL RIGHT KNEE ARTHROPLASTY;  Surgeon: Ollen Gross, MD;  Location: [facility];  Service: Orthopedics;  Laterality: Right;    Family History  Problem Relation Age of Onset   Crohn's disease Brother    Depression Mother    Anxiety disorder Mother    Depression Sister    Aneurysm Father    Colon cancer Neg Hx    Social History:  reports that she has never smoked. She has  never used smokeless tobacco. She reports that she does not drink alcohol and does not use drugs.  Allergies:  Allergies  Allergen Reactions   Morphine And Codeine Hives and Itching    May cause blood pressure to drop   Robaxin [Methocarbamol] Itching   Sulfa Antibiotics Itching    ALL SULFA DRUGS    Medications Prior to Admission  Medication Sig Dispense Refill   acetaminophen (TYLENOL) 325 MG tablet Take 2 tablets (650 mg total) by mouth every 6 (six) hours as needed for mild pain (pain score 1-3), fever or headache (or Fever >/= 101).     Albuterol-Budesonide (AIRSUPRA) 90-80 MCG/ACT AERO Inhale 2 puffs into the lungs every 8 (eight) hours.     amLODipine (NORVASC) 10 MG tablet Take 1 tablet by mouth once daily 90 tablet 0   Azelastine HCl 137 MCG/SPRAY SOLN Place 2 sprays into both nostrils 2 (two) times daily.     buPROPion (WELLBUTRIN XL) 150 MG 24 hr tablet TAKE 1 TABLET BY MOUTH ONCE DAILY IN THE MORNING 30 tablet 0   calcitRIOL (ROCALTROL) 0.25 MCG capsule Take 0.25 mcg by mouth daily.     dicyclomine (BENTYL) 10 MG capsule TAKE 1 CAPSULE BY MOUTH TWICE DAILY BEFORE A MEAL 60 capsule 0   ferrous sulfate 325 (65 FE) MG EC tablet Take 1 tablet (325 mg total) by mouth daily with breakfast. 90 tablet 3   gabapentin (NEURONTIN) 300 MG capsule TAKE 1 CAPSULE BY MOUTH THREE TIMES DAILY 90 capsule 0   HYDROcodone-acetaminophen (NORCO/VICODIN) 5-325 MG tablet Take 1 tablet by mouth every 6 (six) hours as needed for up to 20 doses for moderate pain (pain score 4-6). 20 tablet 0   levothyroxine (SYNTHROID) 100 MCG tablet Take 100 mcg by mouth daily.     loperamide (IMODIUM) 2 MG capsule TAKE 1 CAPSULE BY MOUTH TWICE DAILY AS NEEDED FOR LOOSE STOOLS 180 capsule 1   metoprolol succinate (TOPROL-XL) 25 MG 24 hr tablet Take 1.5 tablets (37.5 mg total) by mouth daily. TAKE 1 & 1/2 (ONE & ONE-HALF) TABLETS BY MOUTH ONCE DAILY (Patient taking differently: Take 25 mg by mouth daily. TAKE 1 & 1/2 (ONE  & ONE-HALF) TABLETS BY MOUTH ONCE DAILY) 45 tablet 3   Multiple Vitamin (MULTIVITAMIN WITH MINERALS) TABS tablet Take 1 tablet by mouth daily.     pantoprazole (PROTONIX) 40 MG tablet Take 1 tablet (40 mg total) by mouth 2 (two) times daily. 180 tablet 3   rOPINIRole (REQUIP) 1 MG tablet Take 1 mg by mouth at bedtime.     rosuvastatin (CRESTOR) 10 MG tablet Take 1 tablet (10 mg total) by mouth daily. 90 tablet 2   sodium bicarbonate 650 MG tablet Take 650 mg by mouth 2 (two) times daily.     spironolactone (ALDACTONE) 50 MG tablet Take 50 mg by mouth daily.     [START ON 11/11/2023] Upadacitinib ER (RINVOQ) 15 MG  TB24 Take 1 tablet (15 mg total) by mouth daily. Start after completing 12 weeks of Rinvoq 45 mg. 30 tablet 5   Upadacitinib ER (RINVOQ) 45 MG TB24 Take 1 tablet (45 mg total) by mouth daily. Take for 12 weeks then reduce to 15 mg once daily. 84 tablet 0   apixaban (ELIQUIS) 5 MG TABS tablet Take 1 tablet (5 mg total) by mouth 2 (two) times daily. 60 tablet 4    No results found for this or any previous visit (from the past 48 hours). No results found.  Review of Systems  HENT:  Positive for trouble swallowing.     Blood pressure 128/66, pulse 89, temperature 98.8 F (37.1 C), temperature source Oral, resp. rate 18, height 5\' 2"  (1.575 m), weight 70.3 kg, SpO2 100%. Physical Exam  GENERAL: The patient is AO x3, in no acute distress. HEENT: Head is normocephalic and atraumatic. EOMI are intact. Mouth is well hydrated and without lesions. NECK: Supple. No masses LUNGS: Clear to auscultation. No presence of rhonchi/wheezing/rales. Adequate chest expansion HEART: RRR, normal s1 and s2. ABDOMEN: Soft, nontender, no guarding, no peritoneal signs, and nondistended. BS +. No masses. EXTREMITIES: Without any cyanosis, clubbing, rash, lesions or edema. NEUROLOGIC: AOx3, no focal motor deficit. SKIN: no jaundice, no rashes  Assessment/Plan Jessica [name] is a 73 y.o. female with a  history of small bowel and colonic Crohn's with right hemicolectomy (previously on Lialda), history of pinhole fistula in 2019, HTN, chronic GERD, hypothyroidism, CKD stage III, and OSA coming for evaluation of heartburn and dysphagia.  Will proceed with EGD.  Dolores Frame, MD 10/07/2023, 7:29 AM

## 2023-10-07 NOTE — Transfer of Care (Signed)
 Immediate Anesthesia Transfer of Care Note  Patient: Jessica [name]  Procedure(s) Performed: EGD (ESOPHAGOGASTRODUODENOSCOPY)  Patient Location: Short Stay  Anesthesia Type:General  Level of Consciousness: awake, alert , and oriented  Airway & Oxygen Therapy: Patient Spontanous Breathing  Post-op Assessment: Report given to RN and Post -op Vital signs reviewed and stable  Post vital signs: Reviewed and stable  Last Vitals:  Vitals Value Taken Time  BP    Temp    Pulse    Resp    SpO2      Last Pain:  Vitals:   10/07/23 0735  TempSrc:   PainSc: 0-No pain      Patients Stated Pain Goal: 5 (10/07/23 0709)  Complications: No notable events documented.

## 2023-10-07 NOTE — Op Note (Addendum)
 [facility] Patient Name: Jessica [name] Procedure Date: 10/07/2023 7:06 AM MRN: [id number] Date of Birth: 1951-03-30 Attending MD: Katrinka Blazing , , [id number] CSN: [id number] Age: 73 Admit Type: Outpatient Procedure:                Upper GI endoscopy Indications:              Dysphagia, Heartburn Providers:                Katrinka Blazing, Angelica Ran, Elinor Parkinson Referring MD:              Medicines:                Monitored Anesthesia Care Complications:            No immediate complications. Estimated Blood Loss:     Estimated blood loss: none. Procedure:                Pre-Anesthesia Assessment:                           - Prior to the procedure, a History and Physical                            was performed, and patient medications, allergies                            and sensitivities were reviewed. The patient's                            tolerance of previous anesthesia was reviewed.                           - The risks and benefits of the procedure and the                            sedation options and risks were discussed with the                            patient. All questions were answered and informed                            consent was obtained.                           - ASA Grade Assessment: III - A patient with severe                            systemic disease.                           After obtaining informed consent, the endoscope was                            passed under direct vision. Throughout the                            procedure, the patient's blood pressure, pulse,  and                            oxygen saturations were monitored continuously. The                            GIF-H190 ([id number]) scope was introduced through the                            mouth, and advanced to the second part of duodenum.                            The upper GI endoscopy was accomplished without                            difficulty. The patient  tolerated the procedure                            well. Scope In: 7:40:39 AM Scope Out: 7:44:26 AM Total Procedure Duration: 0 hours 3 minutes 47 seconds  Findings:      White nummular lesions were noted in the entire esophagus. Biopsies were       taken with a cold forceps for histology.      A medium amount of food (residue) was found in the gastric body.      The examined duodenum was normal. Impression:               - White nummular lesions in esophageal mucosa.                            Biopsied.                           - A medium amount of food (residue) in the stomach.                           - Normal examined duodenum. Moderate Sedation:      Per Anesthesia Care Recommendation:           - Discharge patient to home (ambulatory).                           - Resume previous diet.                           - Await pathology results.                           - Use Prilosec (omeprazole) 40 mg PO daily. Stop                            pantoprazole.                           - Will change Rinvoq maintenance dose to 30 mg qday. Procedure Code(s):        --- Professional ---  40981, Esophagogastroduodenoscopy, flexible,                            transoral; with biopsy, single or multiple Diagnosis Code(s):        --- Professional ---                           K22.89, Other specified disease of esophagus                           R13.10, Dysphagia, unspecified                           R12, Heartburn CPT copyright 2022 [employer]. All rights reserved. The codes documented in this report are preliminary and upon coder review may  be revised to meet current compliance requirements. Katrinka Blazing, MD Katrinka Blazing,  10/07/2023 7:55:40 AM This report has been signed electronically. Number of Addenda: 0

## 2023-10-07 NOTE — Discharge Instructions (Signed)
 You are being discharged to home.  Resume your previous diet.  We are waiting for your pathology results.  Take Prilosec (omeprazole) 40 mg by mouth once a day. Stop pantoprazole. Will change Rinvoq maintenance dose to 30 mg qday.

## 2023-10-08 ENCOUNTER — Encounter (INDEPENDENT_AMBULATORY_CARE_PROVIDER_SITE_OTHER): Payer: Self-pay

## 2023-10-08 ENCOUNTER — Inpatient Hospital Stay: Admitting: Oncology

## 2023-10-08 ENCOUNTER — Telehealth: Payer: Self-pay | Admitting: *Deleted

## 2023-10-08 ENCOUNTER — Encounter (HOSPITAL_COMMUNITY): Payer: Self-pay | Admitting: Gastroenterology

## 2023-10-08 ENCOUNTER — Other Ambulatory Visit (INDEPENDENT_AMBULATORY_CARE_PROVIDER_SITE_OTHER): Payer: Self-pay

## 2023-10-08 ENCOUNTER — Inpatient Hospital Stay: Attending: Hematology

## 2023-10-08 ENCOUNTER — Other Ambulatory Visit (INDEPENDENT_AMBULATORY_CARE_PROVIDER_SITE_OTHER): Payer: Self-pay | Admitting: Gastroenterology

## 2023-10-08 DIAGNOSIS — N1831 Chronic kidney disease, stage 3a: Secondary | ICD-10-CM | POA: Diagnosis not present

## 2023-10-08 DIAGNOSIS — K5000 Crohn's disease of small intestine without complications: Secondary | ICD-10-CM | POA: Insufficient documentation

## 2023-10-08 DIAGNOSIS — N189 Chronic kidney disease, unspecified: Secondary | ICD-10-CM | POA: Diagnosis not present

## 2023-10-08 DIAGNOSIS — K5090 Crohn's disease, unspecified, without complications: Secondary | ICD-10-CM | POA: Diagnosis not present

## 2023-10-08 DIAGNOSIS — D509 Iron deficiency anemia, unspecified: Secondary | ICD-10-CM | POA: Diagnosis not present

## 2023-10-08 DIAGNOSIS — D61818 Other pancytopenia: Secondary | ICD-10-CM

## 2023-10-08 DIAGNOSIS — Z86711 Personal history of pulmonary embolism: Secondary | ICD-10-CM | POA: Diagnosis not present

## 2023-10-08 DIAGNOSIS — Z7901 Long term (current) use of anticoagulants: Secondary | ICD-10-CM | POA: Insufficient documentation

## 2023-10-08 DIAGNOSIS — D72819 Decreased white blood cell count, unspecified: Secondary | ICD-10-CM | POA: Insufficient documentation

## 2023-10-08 DIAGNOSIS — I2699 Other pulmonary embolism without acute cor pulmonale: Secondary | ICD-10-CM

## 2023-10-08 DIAGNOSIS — D692 Other nonthrombocytopenic purpura: Secondary | ICD-10-CM | POA: Diagnosis not present

## 2023-10-08 DIAGNOSIS — I10 Essential (primary) hypertension: Secondary | ICD-10-CM | POA: Diagnosis not present

## 2023-10-08 DIAGNOSIS — Z299 Encounter for prophylactic measures, unspecified: Secondary | ICD-10-CM | POA: Diagnosis not present

## 2023-10-08 DIAGNOSIS — R059 Cough, unspecified: Secondary | ICD-10-CM | POA: Diagnosis not present

## 2023-10-08 DIAGNOSIS — D649 Anemia, unspecified: Secondary | ICD-10-CM

## 2023-10-08 LAB — IRON AND TIBC
Iron: 121 ug/dL (ref 28–170)
Saturation Ratios: 40 % — ABNORMAL HIGH (ref 10.4–31.8)
TIBC: 304 ug/dL (ref 250–450)
UIBC: 183 ug/dL

## 2023-10-08 LAB — CBC WITH DIFFERENTIAL/PLATELET
Abs Immature Granulocytes: 0.22 10*3/uL — ABNORMAL HIGH (ref 0.00–0.07)
Basophils Absolute: 0 10*3/uL (ref 0.0–0.1)
Basophils Relative: 1 %
Eosinophils Absolute: 0.1 10*3/uL (ref 0.0–0.5)
Eosinophils Relative: 1 %
HCT: 26.5 % — ABNORMAL LOW (ref 36.0–46.0)
Hemoglobin: 9.3 g/dL — ABNORMAL LOW (ref 12.0–15.0)
Immature Granulocytes: 5 %
Lymphocytes Relative: 41 %
Lymphs Abs: 1.7 10*3/uL (ref 0.7–4.0)
MCH: 32.9 pg (ref 26.0–34.0)
MCHC: 35.1 g/dL (ref 30.0–36.0)
MCV: 93.6 fL (ref 80.0–100.0)
Monocytes Absolute: 0.3 10*3/uL (ref 0.1–1.0)
Monocytes Relative: 7 %
Neutro Abs: 1.9 10*3/uL (ref 1.7–7.7)
Neutrophils Relative %: 45 %
Platelets: 142 10*3/uL — ABNORMAL LOW (ref 150–400)
RBC: 2.83 MIL/uL — ABNORMAL LOW (ref 3.87–5.11)
RDW: 12.7 % (ref 11.5–15.5)
WBC: 4.2 10*3/uL (ref 4.0–10.5)
nRBC: 0 % (ref 0.0–0.2)

## 2023-10-08 LAB — SURGICAL PATHOLOGY

## 2023-10-08 LAB — COMPREHENSIVE METABOLIC PANEL WITH GFR
ALT: 50 U/L — ABNORMAL HIGH (ref 0–44)
AST: 43 U/L — ABNORMAL HIGH (ref 15–41)
Albumin: 3.9 g/dL (ref 3.5–5.0)
Alkaline Phosphatase: 60 U/L (ref 38–126)
Anion gap: 9 (ref 5–15)
BUN: 19 mg/dL (ref 8–23)
CO2: 19 mmol/L — ABNORMAL LOW (ref 22–32)
Calcium: 8.6 mg/dL — ABNORMAL LOW (ref 8.9–10.3)
Chloride: 104 mmol/L (ref 98–111)
Creatinine, Ser: 1.52 mg/dL — ABNORMAL HIGH (ref 0.44–1.00)
GFR, Estimated: 36 mL/min — ABNORMAL LOW (ref >60)
Glucose, Bld: 92 mg/dL (ref 70–99)
Potassium: 3.9 mmol/L (ref 3.5–5.1)
Sodium: 132 mmol/L — ABNORMAL LOW (ref 135–145)
Total Bilirubin: 1.2 mg/dL (ref 0.0–1.2)
Total Protein: 6.6 g/dL (ref 6.5–8.1)

## 2023-10-08 LAB — VITAMIN B12: Vitamin B-12: 283 pg/mL (ref 180–914)

## 2023-10-08 LAB — FERRITIN: Ferritin: 1253 ng/mL — ABNORMAL HIGH (ref 11–307)

## 2023-10-08 LAB — HIGH SENSITIVITY CRP: CRP, High Sensitivity: 0.46 mg/L (ref 0.00–3.00)

## 2023-10-08 MED ORDER — RINVOQ 30 MG PO TB24: 30.0000 mg | ORAL_TABLET | Freq: Every day | ORAL | 3 refills | Status: DC

## 2023-10-08 NOTE — Telephone Encounter (Signed)
error 

## 2023-10-09 DIAGNOSIS — Z96652 Presence of left artificial knee joint: Secondary | ICD-10-CM | POA: Diagnosis not present

## 2023-10-09 MED ORDER — AMOXICILLIN-POT CLAVULANATE 875-125 MG PO TABS: 1.0000 | ORAL_TABLET | Freq: Two times a day (BID) | ORAL | 0 refills | Status: AC

## 2023-10-10 ENCOUNTER — Encounter: Payer: Self-pay | Admitting: Student

## 2023-10-15 ENCOUNTER — Emergency Department (HOSPITAL_COMMUNITY)

## 2023-10-15 ENCOUNTER — Emergency Department (HOSPITAL_COMMUNITY)
Admission: EM | Admit: 2023-10-15 | Discharge: 2023-10-16 | Disposition: A | Discharge status: Home / Self Care | Attending: Emergency Medicine | Admitting: Emergency Medicine

## 2023-10-15 ENCOUNTER — Other Ambulatory Visit: Payer: Self-pay

## 2023-10-15 ENCOUNTER — Other Ambulatory Visit (HOSPITAL_COMMUNITY): Payer: Self-pay | Admitting: Student

## 2023-10-15 DIAGNOSIS — I129 Hypertensive chronic kidney disease with stage 1 through stage 4 chronic kidney disease, or unspecified chronic kidney disease: Secondary | ICD-10-CM | POA: Diagnosis not present

## 2023-10-15 DIAGNOSIS — N189 Chronic kidney disease, unspecified: Secondary | ICD-10-CM | POA: Diagnosis not present

## 2023-10-15 DIAGNOSIS — K8020 Calculus of gallbladder without cholecystitis without obstruction: Secondary | ICD-10-CM | POA: Diagnosis not present

## 2023-10-15 DIAGNOSIS — R509 Fever, unspecified: Secondary | ICD-10-CM | POA: Diagnosis not present

## 2023-10-15 DIAGNOSIS — Z7901 Long term (current) use of anticoagulants: Secondary | ICD-10-CM | POA: Diagnosis not present

## 2023-10-15 DIAGNOSIS — D631 Anemia in chronic kidney disease: Secondary | ICD-10-CM | POA: Diagnosis not present

## 2023-10-15 DIAGNOSIS — E860 Dehydration: Secondary | ICD-10-CM | POA: Diagnosis not present

## 2023-10-15 DIAGNOSIS — Z79890 Hormone replacement therapy (postmenopausal): Secondary | ICD-10-CM | POA: Insufficient documentation

## 2023-10-15 DIAGNOSIS — R059 Cough, unspecified: Secondary | ICD-10-CM | POA: Diagnosis not present

## 2023-10-15 DIAGNOSIS — J40 Bronchitis, not specified as acute or chronic: Secondary | ICD-10-CM | POA: Diagnosis not present

## 2023-10-15 DIAGNOSIS — E039 Hypothyroidism, unspecified: Secondary | ICD-10-CM | POA: Insufficient documentation

## 2023-10-15 DIAGNOSIS — D649 Anemia, unspecified: Secondary | ICD-10-CM | POA: Diagnosis not present

## 2023-10-15 DIAGNOSIS — Z79899 Other long term (current) drug therapy: Secondary | ICD-10-CM | POA: Insufficient documentation

## 2023-10-15 DIAGNOSIS — R911 Solitary pulmonary nodule: Secondary | ICD-10-CM | POA: Diagnosis not present

## 2023-10-15 DIAGNOSIS — R112 Nausea with vomiting, unspecified: Secondary | ICD-10-CM

## 2023-10-15 DIAGNOSIS — Z96652 Presence of left artificial knee joint: Secondary | ICD-10-CM

## 2023-10-15 DIAGNOSIS — K7460 Unspecified cirrhosis of liver: Secondary | ICD-10-CM | POA: Diagnosis not present

## 2023-10-15 DIAGNOSIS — I2510 Atherosclerotic heart disease of native coronary artery without angina pectoris: Secondary | ICD-10-CM | POA: Diagnosis not present

## 2023-10-15 LAB — COMPREHENSIVE METABOLIC PANEL WITH GFR
ALT: 29 U/L (ref 0–44)
AST: 40 U/L (ref 15–41)
Albumin: 4 g/dL (ref 3.5–5.0)
Alkaline Phosphatase: 54 U/L (ref 38–126)
Anion gap: 10 (ref 5–15)
BUN: 13 mg/dL (ref 8–23)
CO2: 19 mmol/L — ABNORMAL LOW (ref 22–32)
Calcium: 9 mg/dL (ref 8.9–10.3)
Chloride: 102 mmol/L (ref 98–111)
Creatinine, Ser: 1.47 mg/dL — ABNORMAL HIGH (ref 0.44–1.00)
GFR, Estimated: 38 mL/min — ABNORMAL LOW (ref >60)
Glucose, Bld: 85 mg/dL (ref 70–99)
Potassium: 4 mmol/L (ref 3.5–5.1)
Sodium: 131 mmol/L — ABNORMAL LOW (ref 135–145)
Total Bilirubin: 1.1 mg/dL (ref 0.0–1.2)
Total Protein: 6.7 g/dL (ref 6.5–8.1)

## 2023-10-15 LAB — LACTIC ACID, PLASMA
Lactic Acid, Venous: 1.3 mmol/L (ref 0.5–1.9)
Lactic Acid, Venous: 0.9 mmol/L (ref 0.5–1.9)

## 2023-10-15 LAB — URINALYSIS, ROUTINE W REFLEX MICROSCOPIC
Bilirubin Urine: NEGATIVE
Glucose, UA: NEGATIVE mg/dL
Hgb urine dipstick: NEGATIVE
Ketones, ur: NEGATIVE mg/dL
Leukocytes,Ua: NEGATIVE
Nitrite: NEGATIVE
Protein, ur: NEGATIVE mg/dL
Specific Gravity, Urine: 1.018 (ref 1.005–1.030)
pH: 5 (ref 5.0–8.0)

## 2023-10-15 LAB — CBC WITH DIFFERENTIAL/PLATELET
Abs Immature Granulocytes: 0.18 10*3/uL — ABNORMAL HIGH (ref 0.00–0.07)
Basophils Absolute: 0 10*3/uL (ref 0.0–0.1)
Basophils Relative: 0 %
Eosinophils Absolute: 0.1 10*3/uL (ref 0.0–0.5)
Eosinophils Relative: 1 %
HCT: 24.7 % — ABNORMAL LOW (ref 36.0–46.0)
Hemoglobin: 8.7 g/dL — ABNORMAL LOW (ref 12.0–15.0)
Immature Granulocytes: 5 %
Lymphocytes Relative: 31 %
Lymphs Abs: 1.1 10*3/uL (ref 0.7–4.0)
MCH: 33 pg (ref 26.0–34.0)
MCHC: 35.2 g/dL (ref 30.0–36.0)
MCV: 93.6 fL (ref 80.0–100.0)
Monocytes Absolute: 0.3 10*3/uL (ref 0.1–1.0)
Monocytes Relative: 8 %
Neutro Abs: 2.1 10*3/uL (ref 1.7–7.7)
Neutrophils Relative %: 55 %
Platelets: 185 10*3/uL (ref 150–400)
RBC: 2.64 MIL/uL — ABNORMAL LOW (ref 3.87–5.11)
RDW: 12.9 % (ref 11.5–15.5)
WBC: 3.7 10*3/uL — ABNORMAL LOW (ref 4.0–10.5)
nRBC: 0 % (ref 0.0–0.2)

## 2023-10-15 LAB — TROPONIN I (HIGH SENSITIVITY)
Troponin I (High Sensitivity): 7 ng/L (ref <18)
Troponin I (High Sensitivity): 7 ng/L (ref <18)

## 2023-10-15 LAB — RESP PANEL BY RT-PCR (RSV, FLU A&B, COVID)  RVPGX2
Influenza A by PCR: NEGATIVE
Influenza B by PCR: NEGATIVE
Resp Syncytial Virus by PCR: NEGATIVE
SARS Coronavirus 2 by RT PCR: NEGATIVE

## 2023-10-15 MED ORDER — ONDANSETRON HCL 4 MG/2ML IJ SOLN
4.0000 mg | Freq: Once | INTRAMUSCULAR | Status: AC
  Administered 2023-10-15: 4 mg via INTRAVENOUS
  Filled 2023-10-15: qty 2

## 2023-10-15 MED ORDER — FENTANYL CITRATE PF 50 MCG/ML IJ SOSY
50.0000 ug | PREFILLED_SYRINGE | Freq: Once | INTRAMUSCULAR | Status: AC
  Administered 2023-10-15: 50 ug via INTRAVENOUS
  Filled 2023-10-15: qty 1

## 2023-10-15 MED ORDER — SODIUM CHLORIDE 0.9 % IV BOLUS
1000.0000 mL | Freq: Once | INTRAVENOUS | Status: AC
  Administered 2023-10-15: 1000 mL via INTRAVENOUS

## 2023-10-15 MED ORDER — IOHEXOL 300 MG/ML  SOLN
80.0000 mL | Freq: Once | INTRAMUSCULAR | Status: AC | PRN
  Administered 2023-10-15: 80 mL via INTRAVENOUS

## 2023-10-15 NOTE — ED Provider Triage Note (Signed)
 Emergency Medicine Provider Triage Evaluation Note  Jessica [name] , a 73 y.o. female  was evaluated in triage.  Pt complains of cough, congestion, fever, nausea, vomiting, diarrhea.  Review of Systems  Positive: Cough, congestion, fever, nausea, vomiting, diarrhea Negative: Abdominal pain, chest pain, shortness of breath  Physical Exam  BP 132/65   Pulse 100   Temp 99.5 F (37.5 C) (Oral)   Resp 17   Ht 5\' 2"  (1.575 m)   Wt 70.3 kg   SpO2 100%   BMI 28.35 kg/m  Gen:   Awake, no distress   Resp:  Normal effort  MSK:   Moves extremities without difficulty  Other:  No focal tenderness to abdomen  Medical Decision Making  Medically screening exam initiated at 5:25 PM.  Appropriate orders placed.  SUMAIYA ARRUDA was informed that the remainder of the evaluation will be completed by another provider, this initial triage assessment does not replace that evaluation, and the importance of remaining in the ED until their evaluation is complete.  Labs and imaging have been ordered.  Awaiting bed in the back at this time   Lelon Perla, Cordelia Poche 10/15/23 1725

## 2023-10-15 NOTE — ED Triage Notes (Signed)
 Pt arrived POV with c/o vomiting , cough, diarrhea, fever and chills x 1 week.

## 2023-10-15 NOTE — ED Provider Notes (Signed)
 [place] EMERGENCY DEPARTMENT AT [facility] Provider Note   CSN: [id number] Arrival date & time: 10/15/23  1616     History {Add pertinent medical, surgical, social history, OB history to HPI:1} Chief Complaint  Patient presents with   Cough   Fever    Jessica [name] is a 73 y.o. female.  Pt is a 73 yo female with pmhx significant for crohn's disease, neuropathy, hypothyroidism, htn, chronic back pain, gerd, anxiety, depression, ckd, and anemia.  Pt has been sick for about a week.  Pt had an EGD on 4/1 and has had problems with coughing and n/v since then.  She did call GI (Dr. Levon Hedger) and tell them about the cough and they put her on Augmentin in case she had some aspiration with the EGD.  Pt took those pills, but is no better.  Pt was recently started on Rinqoq for her Crohn's.        Home Medications Prior to Admission medications   Medication Sig Start Date End Date Taking? Authorizing Provider  acetaminophen (TYLENOL) 325 MG tablet Take 2 tablets (650 mg total) by mouth every 6 (six) hours as needed for mild pain (pain score 1-3), fever or headache (or Fever >/= 101). 07/16/23   Shon Hale, MD  Albuterol-Budesonide (AIRSUPRA) 90-80 MCG/ACT AERO Inhale 2 puffs into the lungs every 8 (eight) hours. 07/22/23   Oretha Milch, MD  amLODipine (NORVASC) 10 MG tablet Take 1 tablet by mouth once daily 10/06/23   Antoine Poche, MD  apixaban (ELIQUIS) 5 MG TABS tablet Take 1 tablet (5 mg total) by mouth 2 (two) times daily. 08/13/23   Shon Hale, MD  Azelastine HCl 137 MCG/SPRAY SOLN Place 2 sprays into both nostrils 2 (two) times daily. 07/17/23   [provider]  buPROPion (WELLBUTRIN XL) 150 MG 24 hr tablet TAKE 1 TABLET BY MOUTH ONCE DAILY IN THE MORNING 09/30/23   Myrlene Broker, MD  calcitRIOL (ROCALTROL) 0.25 MCG capsule Take 0.25 mcg by mouth daily.    [provider]  dicyclomine (BENTYL) 10 MG capsule TAKE 1 CAPSULE BY MOUTH TWICE  DAILY BEFORE A MEAL 10/01/23   Aida Raider, NP  ferrous sulfate 325 (65 FE) MG EC tablet Take 1 tablet (325 mg total) by mouth daily with breakfast. 07/16/23   Shon Hale, MD  gabapentin (NEURONTIN) 300 MG capsule TAKE 1 CAPSULE BY MOUTH THREE TIMES DAILY 03/03/23   Hyatt, Max T, DPM  HYDROcodone-acetaminophen (NORCO/VICODIN) 5-325 MG tablet Take 1 tablet by mouth every 6 (six) hours as needed for up to 20 doses for moderate pain (pain score 4-6). 06/26/23   Barbaraann Share, DPM  levothyroxine (SYNTHROID) 100 MCG tablet Take 100 mcg by mouth daily. 01/09/22   [provider]  loperamide (IMODIUM) 2 MG capsule TAKE 1 CAPSULE BY MOUTH TWICE DAILY AS NEEDED FOR LOOSE STOOLS 12/26/22   Aida Raider, NP  metoprolol succinate (TOPROL-XL) 25 MG 24 hr tablet Take 1.5 tablets (37.5 mg total) by mouth daily. TAKE 1 & 1/2 (ONE & ONE-HALF) TABLETS BY MOUTH ONCE DAILY Patient taking differently: Take 25 mg by mouth daily. TAKE 1 & 1/2 (ONE & ONE-HALF) TABLETS BY MOUTH ONCE DAILY 07/16/23   Shon Hale, MD  Multiple Vitamin (MULTIVITAMIN WITH MINERALS) TABS tablet Take 1 tablet by mouth daily.    [provider]  omeprazole (PRILOSEC) 40 MG capsule Take 1 capsule (40 mg total) by mouth daily. 10/07/23   Marguerita Merles,  Reuel Boom, MD  rOPINIRole (REQUIP) 1 MG tablet Take 1 mg by mouth at bedtime. 03/18/23   [provider]  rosuvastatin (CRESTOR) 10 MG tablet Take 1 tablet (10 mg total) by mouth daily. 01/10/23   Sharlene Dory, NP  sodium bicarbonate 650 MG tablet Take 650 mg by mouth 2 (two) times daily.    [provider]  spironolactone (ALDACTONE) 50 MG tablet Take 50 mg by mouth daily. 03/27/22   [provider]  Upadacitinib ER (RINVOQ) 30 MG TB24 Take 1 tablet (30 mg total) by mouth daily. Take daily after running out of the 45 mg dosing 10/08/23   Marguerita Merles, Reuel Boom, MD  Upadacitinib ER [facility]) 45 MG TB24 Take 1 tablet (45 mg total) by mouth daily.  Take for 12 weeks then reduce to 15 mg once daily. 08/19/23 11/11/23  Aida Raider, NP      Allergies    Morphine and codeine, Robaxin [methocarbamol], and Sulfa antibiotics    Review of Systems   Review of Systems  Constitutional:  Positive for fever.  Respiratory:  Positive for cough.   Gastrointestinal:  Positive for nausea and vomiting.  All other systems reviewed and are negative.   Physical Exam Updated Vital Signs BP 96/60   Pulse 82   Temp 99.5 F (37.5 C) (Oral)   Resp 15   Ht 5\' 2"  (1.575 m)   Wt 70.3 kg   SpO2 97%   BMI 28.35 kg/m  Physical Exam Vitals and nursing note reviewed.  Constitutional:      Appearance: Normal appearance.  HENT:     Head: Normocephalic and atraumatic.     Right Ear: External ear normal.     Left Ear: External ear normal.     Nose: Nose normal.     Mouth/Throat:     Mouth: Mucous membranes are dry.  Eyes:     Extraocular Movements: Extraocular movements intact.     Conjunctiva/sclera: Conjunctivae normal.     Pupils: Pupils are equal, round, and reactive to light.  Cardiovascular:     Rate and Rhythm: Normal rate and regular rhythm.     Pulses: Normal pulses.     Heart sounds: Normal heart sounds.  Pulmonary:     Effort: Pulmonary effort is normal.     Breath sounds: Normal breath sounds.  Abdominal:     General: Abdomen is flat. Bowel sounds are normal.     Palpations: Abdomen is soft.  Musculoskeletal:        General: Normal range of motion.     Cervical back: Normal range of motion and neck supple.  Skin:    General: Skin is warm.     Capillary Refill: Capillary refill takes less than 2 seconds.  Neurological:     General: No focal deficit present.     Mental Status: She is alert and oriented to person, place, and time.  Psychiatric:        Mood and Affect: Mood normal.        Behavior: Behavior normal.     ED Results / Procedures / Treatments   Labs (all labs ordered are listed, but only abnormal results are  displayed) Labs Reviewed  COMPREHENSIVE METABOLIC PANEL WITH GFR - Abnormal; Notable for the following components:      Result Value   Sodium 131 (*)    CO2 19 (*)    Creatinine, Ser 1.47 (*)    GFR, Estimated 38 (*)    All other components  within normal limits  CBC WITH DIFFERENTIAL/PLATELET - Abnormal; Notable for the following components:   WBC 3.7 (*)    RBC 2.64 (*)    Hemoglobin 8.7 (*)    HCT 24.7 (*)    Abs Immature Granulocytes 0.18 (*)    All other components within normal limits  RESP PANEL BY RT-PCR (RSV, FLU A&B, COVID)  RVPGX2  LACTIC ACID, PLASMA  LACTIC ACID, PLASMA  URINALYSIS, ROUTINE W REFLEX MICROSCOPIC  TROPONIN I (HIGH SENSITIVITY)  TROPONIN I (HIGH SENSITIVITY)    EKG None  Radiology DG Chest 2 View Result Date: 10/15/2023 CLINICAL DATA:  Cough EXAM: CHEST - 2 VIEW COMPARISON:  07/14/2023 FINDINGS: Calcified granuloma in the left lower lung. No acute airspace disease or effusion. Normal cardiac size. No pneumothorax IMPRESSION: No active cardiopulmonary disease. Electronically Signed   By: Jasmine Pang M.D.   On: 10/15/2023 20:06    Procedures Procedures  {Document cardiac monitor, telemetry assessment procedure when appropriate:1}  Medications Ordered in ED Medications  sodium chloride 0.9 % bolus 1,000 mL (1,000 mLs Intravenous New Bag/Given 10/15/23 2049)  ondansetron (ZOFRAN) injection 4 mg (4 mg Intravenous Given 10/15/23 2047)  fentaNYL (SUBLIMAZE) injection 50 mcg (50 mcg Intravenous Given 10/15/23 2054)  iohexol (OMNIPAQUE) 300 MG/ML solution 80 mL (80 mLs Intravenous Contrast Given 10/15/23 2112)    ED Course/ Medical Decision Making/ A&P   {   Click here for ABCD2, HEART and other calculatorsREFRESH Note before signing :1}                              Medical Decision Making Amount and/or Complexity of Data Reviewed Radiology: ordered.  Risk Prescription drug management.   This patient presents to the ED for concern of cough, this  involves an extensive number of treatment options, and is a complaint that carries with it a high risk of complications and morbidity.  The differential diagnosis includes covid/flu/rsv, pna, bronchitis   Co morbidities that complicate the patient evaluation  crohn's disease, neuropathy, hypothyroidism, htn, chronic back pain, gerd, anxiety, depression, ckd, and anemia   Additional history obtained:  Additional history obtained from epic chart review External records from outside source obtained and reviewed including husband   Lab Tests:  I Ordered, and personally interpreted labs.  The pertinent results include:  covid/flu/rsv neg, cbc with wbc low at 3.7, hgb low at 8.7 (hgb 9.3 on 4/2 and 8.3 on 4/1, but it was 12.5 in Feb); cmp with CO2 19 and cr 1.47 (1.52 on 4/2); lactic nl, trop nl   Imaging Studies ordered:  I ordered imaging studies including cxr and ct chest/abd/pelvis  I independently visualized and interpreted imaging which showed  CXR: No active cardiopulmonary disease.  CT I agree with the radiologist interpretation   Cardiac Monitoring:  The patient was maintained on a cardiac monitor.  I personally viewed and interpreted the cardiac monitored which showed an underlying rhythm of: nsr   Medicines ordered and prescription drug management:  I ordered medication including ivfs/zofran/fentanyl  for sx  Reevaluation of the patient after these medicines showed that the patient improved I have reviewed the patients home medicines and have made adjustments as needed   Test Considered:  ct   Critical Interventions:  ***   Consultations Obtained:  I requested consultation with the ***,  and discussed lab and imaging findings as well as pertinent plan - they recommend: ***   Problem List /  ED Course:  ***   Reevaluation:  After the interventions noted above, I reevaluated the patient and found that they have :improved   Social Determinants of  Health:  Lives at home   Dispostion:  After consideration of the diagnostic results and the patients response to treatment, I feel that the patent would benefit from ***.    {Document critical care time when appropriate:1} {Document review of labs and clinical decision tools ie heart score, Chads2Vasc2 etc:1}  {Document your independent review of radiology images, and any outside records:1} {Document your discussion with family members, caretakers, and with consultants:1} {Document social determinants of health affecting pt's care:1} {Document your decision making why or why not admission, treatments were needed:1} Final Clinical Impression(s) / ED Diagnoses Final diagnoses:  None    Rx / DC Orders ED Discharge Orders     None

## 2023-10-16 ENCOUNTER — Inpatient Hospital Stay (HOSPITAL_BASED_OUTPATIENT_CLINIC_OR_DEPARTMENT_OTHER): Admitting: Oncology

## 2023-10-16 ENCOUNTER — Encounter: Payer: Self-pay | Admitting: *Deleted

## 2023-10-16 ENCOUNTER — Encounter: Payer: Self-pay | Admitting: Oncology

## 2023-10-16 DIAGNOSIS — Z1152 Encounter for screening for COVID-19: Secondary | ICD-10-CM | POA: Diagnosis not present

## 2023-10-16 DIAGNOSIS — E860 Dehydration: Secondary | ICD-10-CM | POA: Diagnosis not present

## 2023-10-16 DIAGNOSIS — K8020 Calculus of gallbladder without cholecystitis without obstruction: Secondary | ICD-10-CM | POA: Diagnosis not present

## 2023-10-16 DIAGNOSIS — Z9049 Acquired absence of other specified parts of digestive tract: Secondary | ICD-10-CM | POA: Diagnosis not present

## 2023-10-16 DIAGNOSIS — K2100 Gastro-esophageal reflux disease with esophagitis, without bleeding: Secondary | ICD-10-CM | POA: Diagnosis not present

## 2023-10-16 DIAGNOSIS — M4807 Spinal stenosis, lumbosacral region: Secondary | ICD-10-CM | POA: Diagnosis not present

## 2023-10-16 DIAGNOSIS — Z90710 Acquired absence of both cervix and uterus: Secondary | ICD-10-CM | POA: Diagnosis not present

## 2023-10-16 DIAGNOSIS — K529 Noninfective gastroenteritis and colitis, unspecified: Secondary | ICD-10-CM | POA: Diagnosis not present

## 2023-10-16 DIAGNOSIS — K5080 Crohn's disease of both small and large intestine without complications: Secondary | ICD-10-CM | POA: Diagnosis not present

## 2023-10-16 DIAGNOSIS — R509 Fever, unspecified: Secondary | ICD-10-CM | POA: Diagnosis not present

## 2023-10-16 DIAGNOSIS — K449 Diaphragmatic hernia without obstruction or gangrene: Secondary | ICD-10-CM | POA: Diagnosis not present

## 2023-10-16 DIAGNOSIS — N1832 Chronic kidney disease, stage 3b: Secondary | ICD-10-CM | POA: Diagnosis present

## 2023-10-16 DIAGNOSIS — Z79890 Hormone replacement therapy (postmenopausal): Secondary | ICD-10-CM | POA: Diagnosis not present

## 2023-10-16 DIAGNOSIS — Z86711 Personal history of pulmonary embolism: Secondary | ICD-10-CM | POA: Diagnosis not present

## 2023-10-16 DIAGNOSIS — D61818 Other pancytopenia: Secondary | ICD-10-CM | POA: Diagnosis not present

## 2023-10-16 DIAGNOSIS — J40 Bronchitis, not specified as acute or chronic: Secondary | ICD-10-CM | POA: Diagnosis not present

## 2023-10-16 DIAGNOSIS — M549 Dorsalgia, unspecified: Secondary | ICD-10-CM | POA: Diagnosis present

## 2023-10-16 DIAGNOSIS — G2581 Restless legs syndrome: Secondary | ICD-10-CM | POA: Diagnosis present

## 2023-10-16 DIAGNOSIS — E039 Hypothyroidism, unspecified: Secondary | ICD-10-CM | POA: Diagnosis not present

## 2023-10-16 DIAGNOSIS — K2900 Acute gastritis without bleeding: Secondary | ICD-10-CM | POA: Diagnosis not present

## 2023-10-16 DIAGNOSIS — D509 Iron deficiency anemia, unspecified: Secondary | ICD-10-CM

## 2023-10-16 DIAGNOSIS — F32A Depression, unspecified: Secondary | ICD-10-CM | POA: Diagnosis present

## 2023-10-16 DIAGNOSIS — M5137 Other intervertebral disc degeneration, lumbosacral region with discogenic back pain only: Secondary | ICD-10-CM | POA: Diagnosis not present

## 2023-10-16 DIAGNOSIS — G4733 Obstructive sleep apnea (adult) (pediatric): Secondary | ICD-10-CM | POA: Diagnosis present

## 2023-10-16 DIAGNOSIS — I2699 Other pulmonary embolism without acute cor pulmonale: Secondary | ICD-10-CM | POA: Diagnosis not present

## 2023-10-16 DIAGNOSIS — K5010 Crohn's disease of large intestine without complications: Secondary | ICD-10-CM | POA: Diagnosis present

## 2023-10-16 DIAGNOSIS — D849 Immunodeficiency, unspecified: Secondary | ICD-10-CM | POA: Diagnosis present

## 2023-10-16 DIAGNOSIS — G8929 Other chronic pain: Secondary | ICD-10-CM | POA: Diagnosis present

## 2023-10-16 DIAGNOSIS — K7460 Unspecified cirrhosis of liver: Secondary | ICD-10-CM | POA: Diagnosis present

## 2023-10-16 DIAGNOSIS — Z96651 Presence of right artificial knee joint: Secondary | ICD-10-CM | POA: Diagnosis present

## 2023-10-16 DIAGNOSIS — K3189 Other diseases of stomach and duodenum: Secondary | ICD-10-CM | POA: Diagnosis not present

## 2023-10-16 DIAGNOSIS — D649 Anemia, unspecified: Secondary | ICD-10-CM | POA: Diagnosis not present

## 2023-10-16 DIAGNOSIS — A419 Sepsis, unspecified organism: Secondary | ICD-10-CM | POA: Diagnosis not present

## 2023-10-16 DIAGNOSIS — K5090 Crohn's disease, unspecified, without complications: Secondary | ICD-10-CM | POA: Diagnosis not present

## 2023-10-16 DIAGNOSIS — Z79899 Other long term (current) drug therapy: Secondary | ICD-10-CM | POA: Diagnosis not present

## 2023-10-16 DIAGNOSIS — I129 Hypertensive chronic kidney disease with stage 1 through stage 4 chronic kidney disease, or unspecified chronic kidney disease: Secondary | ICD-10-CM | POA: Diagnosis present

## 2023-10-16 DIAGNOSIS — M4808 Spinal stenosis, sacral and sacrococcygeal region: Secondary | ICD-10-CM | POA: Diagnosis not present

## 2023-10-16 DIAGNOSIS — I10 Essential (primary) hypertension: Secondary | ICD-10-CM | POA: Diagnosis not present

## 2023-10-16 DIAGNOSIS — R112 Nausea with vomiting, unspecified: Secondary | ICD-10-CM | POA: Diagnosis not present

## 2023-10-16 DIAGNOSIS — F39 Unspecified mood [affective] disorder: Secondary | ICD-10-CM | POA: Diagnosis present

## 2023-10-16 MED ORDER — ONDANSETRON 4 MG PO TBDP: 4.0000 mg | ORAL_TABLET | Freq: Three times a day (TID) | ORAL | 0 refills | Status: DC | PRN

## 2023-10-16 MED ORDER — DOXYCYCLINE HYCLATE 100 MG PO TABS
100.0000 mg | ORAL_TABLET | Freq: Once | ORAL | Status: AC
  Administered 2023-10-16: 100 mg via ORAL
  Filled 2023-10-16: qty 1

## 2023-10-16 MED ORDER — DOXYCYCLINE HYCLATE 100 MG PO CAPS: 100.0000 mg | ORAL_CAPSULE | Freq: Two times a day (BID) | ORAL | 0 refills | Status: DC

## 2023-10-16 NOTE — Progress Notes (Signed)
 Virtual Visit via Telephone Note  I connected with Jessica [name] on 10/16/23 at 10:30 AM EDT by telephone and verified that I am speaking with the correct person using two identifiers.  Location: Patient: Home Provider: Clinic   I discussed the limitations, risks, security and privacy concerns of performing an evaluation and management service by telephone and the availability of in person appointments. I also discussed with the patient that there may be a patient responsible charge related to this service. The patient expressed understanding and agreed to proceed.   History of Present Illness: Jessica [name] is a 73 year old female who is followed by [place] for weakly provoked pulmonary embolus after traveling 3 hours from [place] to [place] followed by a 2-hour flight from [place] to [place] on 07/03/2023.  She met with Dr. Ellin Saba on 08/25/2023.  She was hospitalized on 07/14/2023 for shortness of breath and found to have a elevated D-dimer.  Imaging revealed a PE.  He was discharged on Eliquis.  She had recently driven 3 hours in a car and taken a 3-hour plane ride; weakly provoked. PE thought to be secondary to hypercoagulable state from Crohn's disease.  Long-term anticoagulation was recommended.  Patient was evaluated in an ED yesterday for persistent coughing.  Had EGD on 10/07/2023 and called Dr. Wilburt Finlay office who put her on Augmentin for possible aspiration after EGD.  She took full course of treatment but did not feel any better.  Workup did not reveal any evidence of pneumonia but given she was not improving doxycycline was added.  Reports she generally does not feel well.  Has no appetite and very low energy levels.  She has chronic but stable fatigue.  Has trouble sleeping.  Has nausea vomiting and diarrhea secondary to Crohn's disease and acid reflux.  She is followed by gastroenterology for these problems. She is on Rinvoq for Crohn's disease.    Observations/Objective: Review of  Systems  Constitutional:  Positive for malaise/fatigue.  Respiratory:  Positive for cough and shortness of breath.   Cardiovascular:  Positive for chest pain.  Gastrointestinal:  Positive for diarrhea, nausea and vomiting.  Psychiatric/Behavioral:  The patient has insomnia.    Physical Exam Neurological:     Mental Status: She is alert.  Psychiatric:        Behavior: Behavior normal.    Assessment and Plan: 1. Iron deficiency anemia, unspecified iron deficiency anemia type (Primary) -Receives intermittent Monoferric for IDA last given on 07/23/2023. -Labs from 10/15/2023 show a hemoglobin of 8.7 (9.3).  WBC low at 3.7.  Platelets WNL.  Differential is unremarkable.  Ferritin elevated at 1253, B12 283.  -Reports she is taking iron supplements daily. -Denies any bleeding per rectum.  -She does not need any additional IV iron at this time. -Anemia likely secondary to chronic inflammation from Crohn's.  2. Pulmonary embolism, unspecified chronicity, unspecified pulmonary embolism type, unspecified whether acute cor pulmonale present [facility]) -Recently diagnosed with PE in January 2025. -Hypercoagulable workup from February 2025 was essentially unremarkable. -She will likely be on anticoagulation long-term given inflammation secondary to Crohn's disease. -Denies any problems with Eliquis at this time.  3.  CKD -She is followed by Dr. Wolfgang Phoenix. -Creatinine has been elevated since January 2018 ranging from normal to 2.09. -May eventually need erythropoietin stimulating agent.   4.  Crohn's disease: -Followed by Dr. Levon Hedger. -Anemia likely secondary to chronic inflammation. -Most recent colonoscopy showed Crohn's disease with ileitis.  Nonbleeding internal hemorrhoids. -She is currently on Rinvoq.  -Will  reach out to Dr. Levon Hedger.    5.  Leukopenia: -White count 3.7 on 10/07/2023.  Differential unremarkable. -Will continue to monitor.  Likely secondary to immunosuppression medication for  Crohn's. -Will continue to monitor.  Follow Up Instructions: Return to clinic in 3 months with labs a few days before.    I discussed the assessment and treatment plan with the patient. The patient was provided an opportunity to ask questions and all were answered. The patient agreed with the plan and demonstrated an understanding of the instructions.   The patient was advised to call back or seek an in-person evaluation if the symptoms worsen or if the condition fails to improve as anticipated.  I provided 22 minutes of non-face-to-face time during this encounter.   [name], NP

## 2023-10-18 ENCOUNTER — Other Ambulatory Visit: Payer: Self-pay

## 2023-10-18 ENCOUNTER — Inpatient Hospital Stay (HOSPITAL_BASED_OUTPATIENT_CLINIC_OR_DEPARTMENT_OTHER)
Admission: EM | Admit: 2023-10-18 | Discharge: 2023-10-25 | DRG: 392 | Disposition: A | Discharge status: Home Health | Attending: Internal Medicine | Admitting: Internal Medicine

## 2023-10-18 ENCOUNTER — Emergency Department (HOSPITAL_BASED_OUTPATIENT_CLINIC_OR_DEPARTMENT_OTHER)

## 2023-10-18 ENCOUNTER — Encounter (HOSPITAL_BASED_OUTPATIENT_CLINIC_OR_DEPARTMENT_OTHER): Payer: Self-pay | Admitting: Emergency Medicine

## 2023-10-18 DIAGNOSIS — R112 Nausea with vomiting, unspecified: Secondary | ICD-10-CM

## 2023-10-18 DIAGNOSIS — F39 Unspecified mood [affective] disorder: Secondary | ICD-10-CM | POA: Diagnosis present

## 2023-10-18 DIAGNOSIS — G8929 Other chronic pain: Secondary | ICD-10-CM | POA: Diagnosis present

## 2023-10-18 DIAGNOSIS — K5010 Crohn's disease of large intestine without complications: Secondary | ICD-10-CM | POA: Diagnosis present

## 2023-10-18 DIAGNOSIS — D61818 Other pancytopenia: Secondary | ICD-10-CM

## 2023-10-18 DIAGNOSIS — R0682 Tachypnea, not elsewhere classified: Secondary | ICD-10-CM | POA: Diagnosis present

## 2023-10-18 DIAGNOSIS — Z86711 Personal history of pulmonary embolism: Secondary | ICD-10-CM

## 2023-10-18 DIAGNOSIS — K449 Diaphragmatic hernia without obstruction or gangrene: Secondary | ICD-10-CM | POA: Diagnosis present

## 2023-10-18 DIAGNOSIS — G4733 Obstructive sleep apnea (adult) (pediatric): Secondary | ICD-10-CM | POA: Diagnosis present

## 2023-10-18 DIAGNOSIS — K2900 Acute gastritis without bleeding: Principal | ICD-10-CM | POA: Diagnosis present

## 2023-10-18 DIAGNOSIS — E860 Dehydration: Secondary | ICD-10-CM | POA: Diagnosis not present

## 2023-10-18 DIAGNOSIS — K30 Functional dyspepsia: Secondary | ICD-10-CM | POA: Diagnosis present

## 2023-10-18 DIAGNOSIS — Z96651 Presence of right artificial knee joint: Secondary | ICD-10-CM | POA: Diagnosis present

## 2023-10-18 DIAGNOSIS — M1990 Unspecified osteoarthritis, unspecified site: Secondary | ICD-10-CM | POA: Diagnosis present

## 2023-10-18 DIAGNOSIS — Z96641 Presence of right artificial hip joint: Secondary | ICD-10-CM | POA: Diagnosis present

## 2023-10-18 DIAGNOSIS — Z90710 Acquired absence of both cervix and uterus: Secondary | ICD-10-CM

## 2023-10-18 DIAGNOSIS — G2581 Restless legs syndrome: Secondary | ICD-10-CM | POA: Diagnosis present

## 2023-10-18 DIAGNOSIS — K7460 Unspecified cirrhosis of liver: Secondary | ICD-10-CM | POA: Diagnosis present

## 2023-10-18 DIAGNOSIS — Z79890 Hormone replacement therapy (postmenopausal): Secondary | ICD-10-CM

## 2023-10-18 DIAGNOSIS — A419 Sepsis, unspecified organism: Secondary | ICD-10-CM | POA: Diagnosis not present

## 2023-10-18 DIAGNOSIS — N1832 Chronic kidney disease, stage 3b: Secondary | ICD-10-CM | POA: Diagnosis present

## 2023-10-18 DIAGNOSIS — F32A Depression, unspecified: Secondary | ICD-10-CM | POA: Diagnosis present

## 2023-10-18 DIAGNOSIS — I10 Essential (primary) hypertension: Secondary | ICD-10-CM | POA: Diagnosis not present

## 2023-10-18 DIAGNOSIS — Z818 Family history of other mental and behavioral disorders: Secondary | ICD-10-CM

## 2023-10-18 DIAGNOSIS — Z888 Allergy status to other drugs, medicaments and biological substances status: Secondary | ICD-10-CM

## 2023-10-18 DIAGNOSIS — Z7901 Long term (current) use of anticoagulants: Secondary | ICD-10-CM

## 2023-10-18 DIAGNOSIS — D849 Immunodeficiency, unspecified: Secondary | ICD-10-CM | POA: Diagnosis present

## 2023-10-18 DIAGNOSIS — I129 Hypertensive chronic kidney disease with stage 1 through stage 4 chronic kidney disease, or unspecified chronic kidney disease: Secondary | ICD-10-CM | POA: Diagnosis present

## 2023-10-18 DIAGNOSIS — K2100 Gastro-esophageal reflux disease with esophagitis, without bleeding: Secondary | ICD-10-CM | POA: Diagnosis present

## 2023-10-18 DIAGNOSIS — R509 Fever, unspecified: Principal | ICD-10-CM | POA: Diagnosis present

## 2023-10-18 DIAGNOSIS — Z1152 Encounter for screening for COVID-19: Secondary | ICD-10-CM

## 2023-10-18 DIAGNOSIS — Z79899 Other long term (current) drug therapy: Secondary | ICD-10-CM

## 2023-10-18 DIAGNOSIS — Z885 Allergy status to narcotic agent status: Secondary | ICD-10-CM

## 2023-10-18 DIAGNOSIS — F419 Anxiety disorder, unspecified: Secondary | ICD-10-CM | POA: Diagnosis present

## 2023-10-18 DIAGNOSIS — D509 Iron deficiency anemia, unspecified: Secondary | ICD-10-CM | POA: Diagnosis present

## 2023-10-18 DIAGNOSIS — I959 Hypotension, unspecified: Secondary | ICD-10-CM | POA: Diagnosis present

## 2023-10-18 DIAGNOSIS — Z9049 Acquired absence of other specified parts of digestive tract: Secondary | ICD-10-CM

## 2023-10-18 DIAGNOSIS — Z882 Allergy status to sulfonamides status: Secondary | ICD-10-CM

## 2023-10-18 DIAGNOSIS — M549 Dorsalgia, unspecified: Secondary | ICD-10-CM | POA: Diagnosis present

## 2023-10-18 DIAGNOSIS — E039 Hypothyroidism, unspecified: Secondary | ICD-10-CM | POA: Diagnosis present

## 2023-10-18 LAB — COMPREHENSIVE METABOLIC PANEL WITH GFR
ALT: 19 U/L (ref 0–44)
AST: 30 U/L (ref 15–41)
Albumin: 4.1 g/dL (ref 3.5–5.0)
Alkaline Phosphatase: 49 U/L (ref 38–126)
Anion gap: 8 (ref 5–15)
BUN: 15 mg/dL (ref 8–23)
CO2: 21 mmol/L — ABNORMAL LOW (ref 22–32)
Calcium: 9.4 mg/dL (ref 8.9–10.3)
Chloride: 105 mmol/L (ref 98–111)
Creatinine, Ser: 1.67 mg/dL — ABNORMAL HIGH (ref 0.44–1.00)
GFR, Estimated: 33 mL/min — ABNORMAL LOW (ref >60)
Glucose, Bld: 83 mg/dL (ref 70–99)
Potassium: 4.3 mmol/L (ref 3.5–5.1)
Sodium: 134 mmol/L — ABNORMAL LOW (ref 135–145)
Total Bilirubin: 0.9 mg/dL (ref 0.0–1.2)
Total Protein: 6.4 g/dL — ABNORMAL LOW (ref 6.5–8.1)

## 2023-10-18 LAB — CBC WITH DIFFERENTIAL/PLATELET
Abs Immature Granulocytes: 0.07 10*3/uL (ref 0.00–0.07)
Basophils Absolute: 0 10*3/uL (ref 0.0–0.1)
Basophils Relative: 0 %
Eosinophils Absolute: 0 10*3/uL (ref 0.0–0.5)
Eosinophils Relative: 0 %
HCT: 23.6 % — ABNORMAL LOW (ref 36.0–46.0)
Hemoglobin: 8.5 g/dL — ABNORMAL LOW (ref 12.0–15.0)
Immature Granulocytes: 2 %
Lymphocytes Relative: 15 %
Lymphs Abs: 0.5 10*3/uL — ABNORMAL LOW (ref 0.7–4.0)
MCH: 33.3 pg (ref 26.0–34.0)
MCHC: 36 g/dL (ref 30.0–36.0)
MCV: 92.5 fL (ref 80.0–100.0)
Monocytes Absolute: 0.3 10*3/uL (ref 0.1–1.0)
Monocytes Relative: 9 %
Neutro Abs: 2.4 10*3/uL (ref 1.7–7.7)
Neutrophils Relative %: 74 %
Platelets: 176 10*3/uL (ref 150–400)
RBC: 2.55 MIL/uL — ABNORMAL LOW (ref 3.87–5.11)
RDW: 13.2 % (ref 11.5–15.5)
WBC: 3.2 10*3/uL — ABNORMAL LOW (ref 4.0–10.5)
nRBC: 0 % (ref 0.0–0.2)

## 2023-10-18 LAB — URINALYSIS, W/ REFLEX TO CULTURE (INFECTION SUSPECTED)
Bacteria, UA: NONE SEEN
Bilirubin Urine: NEGATIVE
Glucose, UA: NEGATIVE mg/dL
Hgb urine dipstick: NEGATIVE
Leukocytes,Ua: NEGATIVE
Nitrite: NEGATIVE
Protein, ur: NEGATIVE mg/dL
Specific Gravity, Urine: 1.016 (ref 1.005–1.030)
pH: 6.5 (ref 5.0–8.0)

## 2023-10-18 LAB — PROTIME-INR
INR: 1.4 — ABNORMAL HIGH (ref 0.8–1.2)
Prothrombin Time: 17.2 s — ABNORMAL HIGH (ref 11.4–15.2)

## 2023-10-18 LAB — LACTIC ACID, PLASMA: Lactic Acid, Venous: 0.8 mmol/L (ref 0.5–1.9)

## 2023-10-18 MED ORDER — ACETAMINOPHEN 325 MG PO TABS
650.0000 mg | ORAL_TABLET | Freq: Once | ORAL | Status: AC
  Administered 2023-10-18: 650 mg via ORAL
  Filled 2023-10-18: qty 2

## 2023-10-18 MED ORDER — VANCOMYCIN HCL IN DEXTROSE 1-5 GM/200ML-% IV SOLN
1000.0000 mg | Freq: Once | INTRAVENOUS | Status: AC
  Administered 2023-10-18: 1000 mg via INTRAVENOUS
  Filled 2023-10-18: qty 200

## 2023-10-18 MED ORDER — METRONIDAZOLE 500 MG/100ML IV SOLN
500.0000 mg | Freq: Once | INTRAVENOUS | Status: AC
  Administered 2023-10-18: 500 mg via INTRAVENOUS
  Filled 2023-10-18: qty 100

## 2023-10-18 MED ORDER — LACTATED RINGERS IV BOLUS
1000.0000 mL | Freq: Once | INTRAVENOUS | Status: AC
  Administered 2023-10-18: 1000 mL via INTRAVENOUS

## 2023-10-18 MED ORDER — FENTANYL CITRATE PF 50 MCG/ML IJ SOSY
25.0000 ug | PREFILLED_SYRINGE | Freq: Once | INTRAMUSCULAR | Status: AC
  Administered 2023-10-18: 25 ug via INTRAVENOUS
  Filled 2023-10-18: qty 1

## 2023-10-18 MED ORDER — LACTATED RINGERS IV BOLUS (SEPSIS)
1000.0000 mL | Freq: Once | INTRAVENOUS | Status: AC
  Administered 2023-10-18: 1000 mL via INTRAVENOUS

## 2023-10-18 MED ORDER — SODIUM CHLORIDE 0.9 % IV SOLN
2.0000 g | Freq: Once | INTRAVENOUS | Status: AC
  Administered 2023-10-18: 2 g via INTRAVENOUS
  Filled 2023-10-18: qty 12.5

## 2023-10-18 MED ORDER — LACTATED RINGERS IV BOLUS
500.0000 mL | Freq: Once | INTRAVENOUS | Status: AC
  Administered 2023-10-18: 500 mL via INTRAVENOUS

## 2023-10-18 NOTE — ED Notes (Signed)
 Pt ambulatory to RR w assistance from family. Even, steady gait. Requesting update on imaging "because I am so ready to go."

## 2023-10-18 NOTE — ED Notes (Signed)
 X2 sets of blood cultures obtained before antibiotics started

## 2023-10-18 NOTE — ED Triage Notes (Signed)
 Pt states she has been coughing,diarrhea, fever and chills for 1 week after her endoscopy. She has been worked up at [place], primary and "no diagnosis". Not eating/drinking.

## 2023-10-18 NOTE — ED Provider Notes (Signed)
 [place] EMERGENCY DEPARTMENT AT [facility] Provider Note   CSN: [id number] Arrival date & time: 10/18/23  1233     History  Chief Complaint  Patient presents with   Fever    Jessica [name] is a 73 y.o. female.  HPI 73 year old female history of Crohn's disease, on Rinvoq, history of hypertension, CKD, anemia, on Eliquis presents today complaining of cough, fever, nausea, vomiting, diarrhea.  She states the symptoms began after she had upper endoscopy on April 1.  She reports nonproductive cough    Home Medications Prior to Admission medications   Medication Sig Start Date End Date Taking? Authorizing Provider  acetaminophen (TYLENOL) 325 MG tablet Take 2 tablets (650 mg total) by mouth every 6 (six) hours as needed for mild pain (pain score 1-3), fever or headache (or Fever >/= 101). 07/16/23   Colin Dawley, MD  Albuterol-Budesonide (AIRSUPRA) 90-80 MCG/ACT AERO Inhale 2 puffs into the lungs every 8 (eight) hours. 07/22/23   Lind Repine, MD  amLODipine (NORVASC) 10 MG tablet Take 1 tablet by mouth once daily 10/06/23   Laurann Pollock, MD  apixaban (ELIQUIS) 5 MG TABS tablet Take 1 tablet (5 mg total) by mouth 2 (two) times daily. 08/13/23   Colin Dawley, MD  Azelastine HCl 137 MCG/SPRAY SOLN Place 2 sprays into both nostrils 2 (two) times daily. 07/17/23   [provider]  buPROPion (WELLBUTRIN XL) 150 MG 24 hr tablet TAKE 1 TABLET BY MOUTH ONCE DAILY IN THE MORNING 09/30/23   Alysia Bachelor, MD  calcitRIOL (ROCALTROL) 0.25 MCG capsule Take 0.25 mcg by mouth daily.    [provider]  dicyclomine (BENTYL) 10 MG capsule TAKE 1 CAPSULE BY MOUTH TWICE DAILY BEFORE A MEAL 10/01/23   Eustacio Highman, NP  doxycycline (VIBRAMYCIN) 100 MG capsule Take 1 capsule (100 mg total) by mouth 2 (two) times daily. 10/16/23   Haviland, Julie, MD  ferrous sulfate 325 (65 FE) MG EC tablet Take 1 tablet (325 mg total) by mouth daily with breakfast. 07/16/23   Colin Dawley, MD  gabapentin (NEURONTIN) 300 MG capsule TAKE 1 CAPSULE BY MOUTH THREE TIMES DAILY 03/03/23   Hyatt, Max T, DPM  HYDROcodone-acetaminophen (NORCO/VICODIN) 5-325 MG tablet Take 1 tablet by mouth every 6 (six) hours as needed for up to 20 doses for moderate pain (pain score 4-6). 06/26/23   Reina Cara, DPM  levothyroxine (SYNTHROID) 100 MCG tablet Take 100 mcg by mouth daily. 01/09/22   [provider]  loperamide (IMODIUM) 2 MG capsule TAKE 1 CAPSULE BY MOUTH TWICE DAILY AS NEEDED FOR LOOSE STOOLS 12/26/22   Eustacio Highman, NP  metoprolol succinate (TOPROL-XL) 25 MG 24 hr tablet Take 1.5 tablets (37.5 mg total) by mouth daily. TAKE 1 & 1/2 (ONE & ONE-HALF) TABLETS BY MOUTH ONCE DAILY Patient taking differently: Take 25 mg by mouth daily. TAKE 1 & 1/2 (ONE & ONE-HALF) TABLETS BY MOUTH ONCE DAILY 07/16/23   Colin Dawley, MD  Multiple Vitamin (MULTIVITAMIN WITH MINERALS) TABS tablet Take 1 tablet by mouth daily.    [provider]  omeprazole (PRILOSEC) 40 MG capsule Take 1 capsule (40 mg total) by mouth daily. 10/07/23   Urban Garden, MD  ondansetron (ZOFRAN-ODT) 4 MG disintegrating tablet Take 1 tablet (4 mg total) by mouth every 8 (eight) hours as needed. 10/16/23   Haviland, Julie, MD  rOPINIRole (REQUIP) 1 MG tablet Take 1 mg by mouth at bedtime. 03/18/23   [provider]  rosuvastatin (CRESTOR) 10 MG tablet Take 1 tablet (10 mg total) by mouth daily. 01/10/23   Lasalle Pointer, NP  sodium bicarbonate 650 MG tablet Take 650 mg by mouth 2 (two) times daily.    [provider]  spironolactone (ALDACTONE) 50 MG tablet Take 50 mg by mouth daily. 03/27/22   [provider]  Upadacitinib ER (RINVOQ) 30 MG TB24 Take 1 tablet (30 mg total) by mouth daily. Take daily after running out of the 45 mg dosing 10/08/23   Umberto Ganong, Bearl Limes, MD  Upadacitinib ER [facility]) 45 MG TB24 Take 1 tablet (45 mg total) by mouth daily. Take for 12 weeks then  reduce to 15 mg once daily. 08/19/23 11/11/23  Eustacio Highman, NP      Allergies    Morphine and codeine, Robaxin [methocarbamol], and Sulfa antibiotics    Review of Systems   Review of Systems  Physical Exam Updated Vital Signs BP 99/63 (BP Location: Left Arm)   Pulse 81   Temp (!) 102.1 F (38.9 C) (Rectal)   Resp 15   SpO2 98%  Physical Exam  ED Results / Procedures / Treatments   Labs (all labs ordered are listed, but only abnormal results are displayed) Labs Reviewed  CBC WITH DIFFERENTIAL/PLATELET - Abnormal; Notable for the following components:      Result Value   WBC 3.2 (*)    RBC 2.55 (*)    Hemoglobin 8.5 (*)    HCT 23.6 (*)    Lymphs Abs 0.5 (*)    All other components within normal limits  PROTIME-INR - Abnormal; Notable for the following components:   Prothrombin Time 17.2 (*)    INR 1.4 (*)    All other components within normal limits  URINALYSIS, W/ REFLEX TO CULTURE (INFECTION SUSPECTED) - Abnormal; Notable for the following components:   Ketones, ur TRACE (*)    All other components within normal limits  CULTURE, BLOOD (ROUTINE X 2)  CULTURE, BLOOD (ROUTINE X 2)  RESP PANEL BY RT-PCR (RSV, FLU A&B, COVID)  [id number]  LACTIC ACID, PLASMA  LACTIC ACID, PLASMA  COMPREHENSIVE METABOLIC PANEL WITH GFR    EKG EKG Interpretation Date/Time:  Saturday October 18 2023 14:29:17 EDT Ventricular Rate:  84 PR Interval:  144 QRS Duration:  102 QT Interval:  384 QTC Calculation: 454 R Axis:   69  Text Interpretation: Sinus rhythm No significant change since last tracing Confirmed by Auston Blush [phone]) on 10/18/2023 2:50:01 PM  Radiology DG Chest Port 1 View Result Date: 10/18/2023 CLINICAL DATA:  Sepsis EXAM: PORTABLE CHEST 1 VIEW COMPARISON:  None Available. FINDINGS: No definite evidence of lobar infiltrate. Heart mediastinum are not significantly changed. IMPRESSION: 1. No definite evidence of lobar infiltrate, pleural effusion, or pneumothorax.  Electronically Signed   By: Reagan Camera M.D.   On: 10/18/2023 14:09    Procedures .Critical Care  Performed by: Auston Blush, MD Authorized by: Auston Blush, MD   Critical care provider statement:    Critical care time (minutes):  30   Critical care end time:  10/18/2023 3:28 PM   Critical care time was exclusive of:  Separately billable procedures and treating other patients and teaching time   Critical care was necessary to treat or prevent imminent or life-threatening deterioration of the following conditions:  Sepsis   Critical care was time spent personally by me on the following activities:  Development of treatment plan with patient or surrogate, discussions with consultants, evaluation  of patient's response to treatment, examination of patient, ordering and review of laboratory studies, ordering and review of radiographic studies, ordering and performing treatments and interventions, pulse oximetry, re-evaluation of patient's condition and review of old charts     Medications Ordered in ED Medications  ceFEPIme (MAXIPIME) 2 g in sodium chloride 0.9 % 100 mL IVPB (2 g Intravenous New Bag/Given 10/18/23 1526)  metroNIDAZOLE (FLAGYL) IVPB 500 mg (has no administration in time range)  vancomycin (VANCOCIN) IVPB 1000 mg/200 mL premix (has no administration in time range)  acetaminophen (TYLENOL) tablet 650 mg (has no administration in time range)  lactated ringers bolus 1,000 mL (1,000 mLs Intravenous New Bag/Given 10/18/23 1455)    ED Course/ Medical Decision Making/ A&P Clinical Course as of 10/18/23 1528  Sat Oct 18, 2023  1456 CBC reviewed interpreted significant for stable leukopenia and anemia [DR]  1456 Chest x-Melinda Gwinner reviewed interpreted no evidence of acute infiltrate noted radiologist interpretation concurs [DR]    Clinical Course User Index [DR] Auston Blush, MD                                 Medical Decision Making Amount and/or Complexity of Data Reviewed Labs:  ordered. Radiology: ordered.  Risk Prescription drug management.   73 year old female history of Crohn's disease, on chronic immunosuppression, presents today with cough, fever, chills, nausea, vomiting, diarrhea.  Symptoms have been present for approximately 10 days.  He was seen on Crist[facility] Done in the emergency department on 11 9 and evaluated there with CBC with leukopenia, white cell count 3700 and anemia with hemoglobin of 8.7, complete metabolic panel with some mild hyponatremia at 131 and creatinine at 1.47 which has trended up from 20 19 through 20 25 into the mid 1.5 range in general, urinalysis did not show any acute abnormalities and CT of abdomen pelvis did not show any acute abnormalities.  She reports that she has continued to feel poorly since that time and presents today with fever of 102. Patient is being evaluated today for her fever to 102 with blood cultures, lactic acid, labs, COVID, flu.  Her blood pressure is 99/63 and she is receiving IV fluids and broad-spectrum antibiotics-cefepime Flagyl and vancomycin Urine reviewed and negative Patient's care signed out to Dr. Charlee Conine who accepts patient care and will facilitate workup and disposition       Final Clinical Impression(s) / ED Diagnoses Final diagnoses:  Febrile illness    Rx / DC Orders ED Discharge Orders     None         Auston Blush, MD 10/18/23 1528

## 2023-10-19 DIAGNOSIS — D61818 Other pancytopenia: Secondary | ICD-10-CM | POA: Diagnosis present

## 2023-10-19 DIAGNOSIS — K5090 Crohn's disease, unspecified, without complications: Secondary | ICD-10-CM | POA: Diagnosis not present

## 2023-10-19 DIAGNOSIS — G8929 Other chronic pain: Secondary | ICD-10-CM | POA: Diagnosis present

## 2023-10-19 DIAGNOSIS — N1832 Chronic kidney disease, stage 3b: Secondary | ICD-10-CM | POA: Diagnosis present

## 2023-10-19 DIAGNOSIS — K449 Diaphragmatic hernia without obstruction or gangrene: Secondary | ICD-10-CM | POA: Diagnosis present

## 2023-10-19 DIAGNOSIS — K3189 Other diseases of stomach and duodenum: Secondary | ICD-10-CM | POA: Diagnosis not present

## 2023-10-19 DIAGNOSIS — D849 Immunodeficiency, unspecified: Secondary | ICD-10-CM | POA: Diagnosis present

## 2023-10-19 DIAGNOSIS — D509 Iron deficiency anemia, unspecified: Secondary | ICD-10-CM | POA: Diagnosis present

## 2023-10-19 DIAGNOSIS — Z79899 Other long term (current) drug therapy: Secondary | ICD-10-CM | POA: Diagnosis not present

## 2023-10-19 DIAGNOSIS — Z86711 Personal history of pulmonary embolism: Secondary | ICD-10-CM | POA: Diagnosis not present

## 2023-10-19 DIAGNOSIS — K5010 Crohn's disease of large intestine without complications: Secondary | ICD-10-CM | POA: Diagnosis present

## 2023-10-19 DIAGNOSIS — F39 Unspecified mood [affective] disorder: Secondary | ICD-10-CM | POA: Diagnosis present

## 2023-10-19 DIAGNOSIS — A419 Sepsis, unspecified organism: Secondary | ICD-10-CM | POA: Diagnosis not present

## 2023-10-19 DIAGNOSIS — R509 Fever, unspecified: Principal | ICD-10-CM | POA: Diagnosis present

## 2023-10-19 DIAGNOSIS — K2100 Gastro-esophageal reflux disease with esophagitis, without bleeding: Secondary | ICD-10-CM | POA: Diagnosis present

## 2023-10-19 DIAGNOSIS — M549 Dorsalgia, unspecified: Secondary | ICD-10-CM | POA: Diagnosis present

## 2023-10-19 DIAGNOSIS — E039 Hypothyroidism, unspecified: Secondary | ICD-10-CM | POA: Diagnosis present

## 2023-10-19 DIAGNOSIS — M5137 Other intervertebral disc degeneration, lumbosacral region with discogenic back pain only: Secondary | ICD-10-CM | POA: Diagnosis not present

## 2023-10-19 DIAGNOSIS — M4808 Spinal stenosis, sacral and sacrococcygeal region: Secondary | ICD-10-CM | POA: Diagnosis not present

## 2023-10-19 DIAGNOSIS — K529 Noninfective gastroenteritis and colitis, unspecified: Secondary | ICD-10-CM

## 2023-10-19 DIAGNOSIS — F32A Depression, unspecified: Secondary | ICD-10-CM | POA: Diagnosis present

## 2023-10-19 DIAGNOSIS — I10 Essential (primary) hypertension: Secondary | ICD-10-CM | POA: Diagnosis not present

## 2023-10-19 DIAGNOSIS — Z1152 Encounter for screening for COVID-19: Secondary | ICD-10-CM | POA: Diagnosis not present

## 2023-10-19 DIAGNOSIS — K5080 Crohn's disease of both small and large intestine without complications: Secondary | ICD-10-CM | POA: Diagnosis not present

## 2023-10-19 DIAGNOSIS — R112 Nausea with vomiting, unspecified: Secondary | ICD-10-CM | POA: Diagnosis not present

## 2023-10-19 DIAGNOSIS — G4733 Obstructive sleep apnea (adult) (pediatric): Secondary | ICD-10-CM | POA: Diagnosis present

## 2023-10-19 DIAGNOSIS — G2581 Restless legs syndrome: Secondary | ICD-10-CM | POA: Diagnosis present

## 2023-10-19 DIAGNOSIS — K7460 Unspecified cirrhosis of liver: Secondary | ICD-10-CM | POA: Diagnosis present

## 2023-10-19 DIAGNOSIS — Z79890 Hormone replacement therapy (postmenopausal): Secondary | ICD-10-CM | POA: Diagnosis not present

## 2023-10-19 DIAGNOSIS — Z9049 Acquired absence of other specified parts of digestive tract: Secondary | ICD-10-CM | POA: Diagnosis not present

## 2023-10-19 DIAGNOSIS — I129 Hypertensive chronic kidney disease with stage 1 through stage 4 chronic kidney disease, or unspecified chronic kidney disease: Secondary | ICD-10-CM | POA: Diagnosis present

## 2023-10-19 DIAGNOSIS — M4807 Spinal stenosis, lumbosacral region: Secondary | ICD-10-CM | POA: Diagnosis not present

## 2023-10-19 DIAGNOSIS — K2900 Acute gastritis without bleeding: Secondary | ICD-10-CM | POA: Diagnosis present

## 2023-10-19 DIAGNOSIS — Z96651 Presence of right artificial knee joint: Secondary | ICD-10-CM | POA: Diagnosis present

## 2023-10-19 LAB — BASIC METABOLIC PANEL WITH GFR
Anion gap: 10 (ref 5–15)
BUN: 11 mg/dL (ref 8–23)
CO2: 17 mmol/L — ABNORMAL LOW (ref 22–32)
Calcium: 9 mg/dL (ref 8.9–10.3)
Chloride: 108 mmol/L (ref 98–111)
Creatinine, Ser: 1.38 mg/dL — ABNORMAL HIGH (ref 0.44–1.00)
GFR, Estimated: 41 mL/min — ABNORMAL LOW (ref >60)
Glucose, Bld: 88 mg/dL (ref 70–99)
Potassium: 3.7 mmol/L (ref 3.5–5.1)
Sodium: 135 mmol/L (ref 135–145)

## 2023-10-19 LAB — CBC
HCT: 23.7 % — ABNORMAL LOW (ref 36.0–46.0)
Hemoglobin: 8.1 g/dL — ABNORMAL LOW (ref 12.0–15.0)
MCH: 33.1 pg (ref 26.0–34.0)
MCHC: 34.2 g/dL (ref 30.0–36.0)
MCV: 96.7 fL (ref 80.0–100.0)
Platelets: 183 10*3/uL (ref 150–400)
RBC: 2.45 MIL/uL — ABNORMAL LOW (ref 3.87–5.11)
RDW: 13.2 % (ref 11.5–15.5)
WBC: 3.4 10*3/uL — ABNORMAL LOW (ref 4.0–10.5)
nRBC: 0 % (ref 0.0–0.2)

## 2023-10-19 LAB — C DIFFICILE QUICK SCREEN W PCR REFLEX
C Diff antigen: NEGATIVE
C Diff interpretation: NOT DETECTED
C Diff toxin: NEGATIVE

## 2023-10-19 LAB — RESPIRATORY PANEL BY PCR

## 2023-10-19 LAB — PHOSPHORUS: Phosphorus: 2.8 mg/dL (ref 2.5–4.6)

## 2023-10-19 LAB — MAGNESIUM: Magnesium: 1.5 mg/dL — ABNORMAL LOW (ref 1.7–2.4)

## 2023-10-19 MED ORDER — SODIUM CHLORIDE 0.9 % IV SOLN: 2.0000 g | Freq: Two times a day (BID) | INTRAVENOUS | Status: DC

## 2023-10-19 MED ORDER — BUPROPION HCL ER (XL) 150 MG PO TB24
150.0000 mg | ORAL_TABLET | Freq: Every morning | ORAL | Status: DC
  Administered 2023-10-19 – 2023-10-25 (×7): 150 mg via ORAL
  Filled 2023-10-19 (×7): qty 1

## 2023-10-19 MED ORDER — VANCOMYCIN HCL 1250 MG/250ML IV SOLN
1250.0000 mg | INTRAVENOUS | Status: DC
  Administered 2023-10-20: 1250 mg via INTRAVENOUS
  Filled 2023-10-19: qty 250

## 2023-10-19 MED ORDER — MELATONIN 3 MG PO TABS
6.0000 mg | ORAL_TABLET | Freq: Every evening | ORAL | Status: DC | PRN
  Administered 2023-10-19 – 2023-10-23 (×3): 6 mg via ORAL
  Filled 2023-10-19 (×3): qty 2

## 2023-10-19 MED ORDER — FENTANYL CITRATE PF 50 MCG/ML IJ SOSY
50.0000 ug | PREFILLED_SYRINGE | Freq: Once | INTRAMUSCULAR | Status: AC
  Administered 2023-10-19: 50 ug via INTRAVENOUS
  Filled 2023-10-19: qty 1

## 2023-10-19 MED ORDER — ACETAMINOPHEN 325 MG PO TABS
650.0000 mg | ORAL_TABLET | Freq: Once | ORAL | Status: AC
  Administered 2023-10-19: 650 mg via ORAL
  Filled 2023-10-19: qty 2

## 2023-10-19 MED ORDER — CEFEPIME HCL 2 G IV SOLR: 2.0000 g | INTRAVENOUS | Status: DC

## 2023-10-19 MED ORDER — MAGNESIUM SULFATE 2 GM/50ML IV SOLN
2.0000 g | Freq: Once | INTRAVENOUS | Status: AC
  Administered 2023-10-19: 2 g via INTRAVENOUS
  Filled 2023-10-19: qty 50

## 2023-10-19 MED ORDER — HYDROCODONE-ACETAMINOPHEN 5-325 MG PO TABS
1.0000 | ORAL_TABLET | Freq: Four times a day (QID) | ORAL | Status: DC | PRN
  Administered 2023-10-19: 1 via ORAL
  Filled 2023-10-19: qty 1

## 2023-10-19 MED ORDER — ONDANSETRON HCL 4 MG/2ML IJ SOLN
4.0000 mg | Freq: Four times a day (QID) | INTRAMUSCULAR | Status: DC | PRN
  Administered 2023-10-19 – 2023-10-22 (×3): 4 mg via INTRAVENOUS
  Filled 2023-10-19 (×3): qty 2

## 2023-10-19 MED ORDER — LEVOTHYROXINE SODIUM 100 MCG PO TABS
100.0000 ug | ORAL_TABLET | Freq: Every day | ORAL | Status: DC
  Administered 2023-10-19 – 2023-10-25 (×7): 100 ug via ORAL
  Filled 2023-10-19 (×7): qty 1

## 2023-10-19 MED ORDER — ROPINIROLE HCL 0.25 MG PO TABS
0.5000 mg | ORAL_TABLET | Freq: Three times a day (TID) | ORAL | Status: DC
  Administered 2023-10-19 – 2023-10-25 (×19): 0.5 mg via ORAL
  Filled 2023-10-19 (×19): qty 2

## 2023-10-19 MED ORDER — ALPRAZOLAM 0.5 MG PO TABS
0.5000 mg | ORAL_TABLET | Freq: Two times a day (BID) | ORAL | Status: DC | PRN
  Administered 2023-10-19 – 2023-10-24 (×6): 0.5 mg via ORAL
  Filled 2023-10-19 (×7): qty 1

## 2023-10-19 MED ORDER — LACTATED RINGERS IV SOLN: INTRAVENOUS | Status: AC

## 2023-10-19 MED ORDER — SODIUM CHLORIDE 0.9 % IV SOLN
2.0000 g | Freq: Two times a day (BID) | INTRAVENOUS | Status: DC
  Administered 2023-10-19 – 2023-10-21 (×5): 2 g via INTRAVENOUS
  Filled 2023-10-19 (×5): qty 12.5

## 2023-10-19 MED ORDER — ALBUTEROL SULFATE (2.5 MG/3ML) 0.083% IN NEBU: 2.5000 mg | INHALATION_SOLUTION | RESPIRATORY_TRACT | Status: DC | PRN

## 2023-10-19 MED ORDER — ROSUVASTATIN CALCIUM 5 MG PO TABS
5.0000 mg | ORAL_TABLET | Freq: Every day | ORAL | Status: DC
  Administered 2023-10-19 – 2023-10-25 (×7): 5 mg via ORAL
  Filled 2023-10-19 (×7): qty 1

## 2023-10-19 MED ORDER — DICYCLOMINE HCL 10 MG PO CAPS
10.0000 mg | ORAL_CAPSULE | Freq: Three times a day (TID) | ORAL | Status: DC
  Administered 2023-10-19 – 2023-10-25 (×19): 10 mg via ORAL
  Filled 2023-10-19 (×18): qty 1

## 2023-10-19 MED ORDER — METOPROLOL SUCCINATE ER 25 MG PO TB24
12.5000 mg | ORAL_TABLET | Freq: Every day | ORAL | Status: DC
  Administered 2023-10-19 – 2023-10-20 (×2): 12.5 mg via ORAL
  Filled 2023-10-19 (×4): qty 1

## 2023-10-19 MED ORDER — HYDROCODONE-ACETAMINOPHEN 5-325 MG PO TABS
1.0000 | ORAL_TABLET | ORAL | Status: DC | PRN
  Administered 2023-10-19 – 2023-10-24 (×15): 1 via ORAL
  Filled 2023-10-19 (×15): qty 1

## 2023-10-19 MED ORDER — SODIUM CHLORIDE 0.9% FLUSH
3.0000 mL | Freq: Two times a day (BID) | INTRAVENOUS | Status: DC
  Administered 2023-10-19 – 2023-10-25 (×12): 3 mL via INTRAVENOUS

## 2023-10-19 MED ORDER — METRONIDAZOLE 500 MG/100ML IV SOLN
500.0000 mg | Freq: Two times a day (BID) | INTRAVENOUS | Status: DC
  Administered 2023-10-19 – 2023-10-21 (×5): 500 mg via INTRAVENOUS
  Filled 2023-10-19 (×5): qty 100

## 2023-10-19 MED ORDER — ACETAMINOPHEN 500 MG PO TABS
1000.0000 mg | ORAL_TABLET | Freq: Four times a day (QID) | ORAL | Status: DC | PRN
  Administered 2023-10-19 – 2023-10-25 (×4): 1000 mg via ORAL
  Filled 2023-10-19 (×5): qty 2

## 2023-10-19 MED ORDER — APIXABAN 5 MG PO TABS
5.0000 mg | ORAL_TABLET | Freq: Two times a day (BID) | ORAL | Status: DC
  Administered 2023-10-19 – 2023-10-22 (×7): 5 mg via ORAL
  Filled 2023-10-19 (×7): qty 1

## 2023-10-19 NOTE — H&P (Signed)
 History and Physical    Jessica [name] WFU:[id number] DOB: December 25, 1950 DOA: 10/18/2023  PCP: Orlena Bitters, MD   Patient coming from:  Transfer from [facility] ED   Chief Complaint:  Chief Complaint  Patient presents with   Fever    HPI:  Jessica [name] is a 73 y.o. female with hx of chron's disease with prior right hemicolectomy, immunosuppressed, leukopenia, PE on AC, cirrhosis by imaging, hypertension, CKD stage III, hypothyroidism, IDA, mood disorder, who was transferred from [facility] ED due to sepsis with ? GI / respiratory source.   Patient reports after having endoscopy on 4/1 she began to develop a nonproductive cough.  She was prescribed a course of Augmentin by her GI doctor for possible aspiration associated with the procedure.  However she tells me she did not finish the course due to how sick she has been feeling. Cough has continued, with rhinorrhea, diffuse myalgias. Seen in the ED on 4/9 and discharged, Rxd course of Doxycycline but unclear if she took this.   Over the past week she has developed persistent nausea and vomiting, diarrhea and tolerating minimal p.o.'s.  Having about 2 episodes of vomiting per day described as clear/yellow.  Having about 5-6 episodes of diarrhea per day.  Denies any hematemesis/coffee-ground emesis/melena/hematochezia.  Has mild lower abdominal pain.  She has had subjective fevers and chills at home.  Denies any sick contacts.   Review of Systems:  ROS complete and negative except as marked above   Allergies  Allergen Reactions   Morphine And Codeine Hives and Itching    May cause blood pressure to drop   Robaxin [Methocarbamol] Itching   Sulfa Antibiotics Itching    ALL SULFA DRUGS    Prior to Admission medications   Medication Sig Start Date End Date Taking? Authorizing Provider  acetaminophen (TYLENOL) 325 MG tablet Take 2 tablets (650 mg total) by mouth every 6 (six) hours as needed for mild pain (pain score 1-3), fever or headache (or  Fever >/= 101). 07/16/23   Colin Dawley, MD  Albuterol-Budesonide (AIRSUPRA) 90-80 MCG/ACT AERO Inhale 2 puffs into the lungs every 8 (eight) hours. 07/22/23   Lind Repine, MD  amLODipine (NORVASC) 10 MG tablet Take 1 tablet by mouth once daily 10/06/23   Laurann Pollock, MD  apixaban (ELIQUIS) 5 MG TABS tablet Take 1 tablet (5 mg total) by mouth 2 (two) times daily. 08/13/23   Colin Dawley, MD  Azelastine HCl 137 MCG/SPRAY SOLN Place 2 sprays into both nostrils 2 (two) times daily. 07/17/23   [provider]  buPROPion (WELLBUTRIN XL) 150 MG 24 hr tablet TAKE 1 TABLET BY MOUTH ONCE DAILY IN THE MORNING 09/30/23   Alysia Bachelor, MD  calcitRIOL (ROCALTROL) 0.25 MCG capsule Take 0.25 mcg by mouth daily.    [provider]  dicyclomine (BENTYL) 10 MG capsule TAKE 1 CAPSULE BY MOUTH TWICE DAILY BEFORE A MEAL 10/01/23   Eustacio Highman, NP  doxycycline (VIBRAMYCIN) 100 MG capsule Take 1 capsule (100 mg total) by mouth 2 (two) times daily. 10/16/23   Haviland, Julie, MD  ferrous sulfate 325 (65 FE) MG EC tablet Take 1 tablet (325 mg total) by mouth daily with breakfast. 07/16/23   Emokpae, Courage, MD  gabapentin (NEURONTIN) 300 MG capsule TAKE 1 CAPSULE BY MOUTH THREE TIMES DAILY 03/03/23   Hyatt, Max T, DPM  HYDROcodone-acetaminophen (NORCO/VICODIN) 5-325 MG tablet Take 1 tablet by mouth every 6 (six) hours as needed for up to  20 doses for moderate pain (pain score 4-6). 06/26/23   Reina Cara, DPM  levothyroxine (SYNTHROID) 100 MCG tablet Take 100 mcg by mouth daily. 01/09/22   [provider]  loperamide (IMODIUM) 2 MG capsule TAKE 1 CAPSULE BY MOUTH TWICE DAILY AS NEEDED FOR LOOSE STOOLS 12/26/22   Eustacio Highman, NP  metoprolol succinate (TOPROL-XL) 25 MG 24 hr tablet Take 1.5 tablets (37.5 mg total) by mouth daily. TAKE 1 & 1/2 (ONE & ONE-HALF) TABLETS BY MOUTH ONCE DAILY Patient taking differently: Take 25 mg by mouth daily. TAKE 1 & 1/2 (ONE & ONE-HALF) TABLETS BY  MOUTH ONCE DAILY 07/16/23   Colin Dawley, MD  Multiple Vitamin (MULTIVITAMIN WITH MINERALS) TABS tablet Take 1 tablet by mouth daily.    [provider]  omeprazole (PRILOSEC) 40 MG capsule Take 1 capsule (40 mg total) by mouth daily. 10/07/23   Urban Garden, MD  ondansetron (ZOFRAN-ODT) 4 MG disintegrating tablet Take 1 tablet (4 mg total) by mouth every 8 (eight) hours as needed. 10/16/23   Haviland, Julie, MD  rOPINIRole (REQUIP) 1 MG tablet Take 1 mg by mouth at bedtime. 03/18/23   [provider]  rosuvastatin (CRESTOR) 10 MG tablet Take 1 tablet (10 mg total) by mouth daily. 01/10/23   Lasalle Pointer, NP  sodium bicarbonate 650 MG tablet Take 650 mg by mouth 2 (two) times daily.    [provider]  spironolactone (ALDACTONE) 50 MG tablet Take 50 mg by mouth daily. 03/27/22   [provider]  Upadacitinib ER (RINVOQ) 30 MG TB24 Take 1 tablet (30 mg total) by mouth daily. Take daily after running out of the 45 mg dosing 10/08/23   Umberto Ganong, Bearl Limes, MD  Upadacitinib ER [facility]) 45 MG TB24 Take 1 tablet (45 mg total) by mouth daily. Take for 12 weeks then reduce to 15 mg once daily. 08/19/23 11/11/23  Eustacio Highman, NP    Past Medical History:  Diagnosis Date   Anxiety attack    Arthritis    Back pain, chronic    Chronic kidney disease    Crohn's colitis (HCC)    Depression (emotion)    Dysrhythmia    GERD (gastroesophageal reflux disease)    Hypertension    Hypothyroid    Iron deficiency anemia, unspecified    Neuropathy    Sleep apnea    cpap is not working per patient - needs to find a new doctor - not used since 7/16 per pat    Stage 3 chronic kidney disease (HCC)    Vertigo    followed by Dr Kievie- in [place]     Past Surgical History:  Procedure Laterality Date   ABDOMINAL HYSTERECTOMY     BIOPSY  06/08/2018   Procedure: BIOPSY;  Surgeon: Ruby Corporal, MD;  [facility];  Service: Endoscopy;;  (colon)    BIOPSY  04/01/2023   Procedure: BIOPSY;  Surgeon: Urban Garden, MD;  [facility];  Service: Gastroenterology;;   CHOLECYSTECTOMY     COLON SURGERY     for Crohn's in the 80s in [place]    COLONOSCOPY N/A 03/11/2013   Procedure: COLONOSCOPY;  Surgeon: Ruby Corporal, MD;  [facility];  Service: Endoscopy;  Laterality: N/A;  1200   COLONOSCOPY N/A 06/08/2018   Procedure: COLONOSCOPY;  Surgeon: Ruby Corporal, MD;  [facility];  Service: Endoscopy;  Laterality: N/A;  8:25   COLONOSCOPY WITH PROPOFOL N/A 04/01/2023  Procedure: COLONOSCOPY WITH PROPOFOL;  Surgeon: Urban Garden, MD;  [facility];  Service: Gastroenterology;  Laterality: N/A;   ESOPHAGEAL BRUSHING  04/01/2023   Procedure: ESOPHAGEAL BRUSHING;  Surgeon: Urban Garden, MD;  [facility];  Service: Gastroenterology;;   ESOPHAGOGASTRODUODENOSCOPY N/A 10/07/2023   Procedure: EGD (ESOPHAGOGASTRODUODENOSCOPY);  Surgeon: Umberto Ganong, Bearl Limes, MD;  [facility];  Service: Gastroenterology;  Laterality: N/A;  215[PM, ASA 3, pt knows to arrive at 6:15   ESOPHAGOGASTRODUODENOSCOPY (EGD) WITH PROPOFOL N/A 04/01/2023   Procedure: ESOPHAGOGASTRODUODENOSCOPY (EGD) WITH PROPOFOL;  Surgeon: Urban Garden, MD;  [facility];  Service: Gastroenterology;  Laterality: N/A;  1:00am;asa 3   fatty tumor removed     HARDWARE REMOVAL Right 08/07/2016   Procedure: HARDWARE REMOVAL;  Surgeon: Liliane Rei, MD;  Location: [facility];  Service: Orthopedics;  Laterality: Right;   Hip replacement      Rt hip in 2010 in [place]   KNEE ARTHROSCOPY Left 03/29/2015   Procedure: ARTHROSCOPY LEFT KNEE WITH MENSICAL DEBRIDEMENT, chondroplasty;  Surgeon: Liliane Rei, MD;  Location: [facility];  Service: Orthopedics;  Laterality: Left;   ORIF TIBIA PLATEAU Right 02/29/2016   Procedure: OPEN REDUCTION INTERNAL FIXATION (ORIF RIGHT  TIBIAL PLATEAU  FRACTURE;  Surgeon: Liliane Rei, MD;  Location: [facility];  Service: Orthopedics;  Laterality: Right;   POLYPECTOMY  04/01/2023   Procedure: POLYPECTOMY;  Surgeon: Urban Garden, MD;  [facility];  Service: Gastroenterology;;   TEE WITHOUT CARDIOVERSION N/A 03/27/2021   Procedure: TRANSESOPHAGEAL ECHOCARDIOGRAM (TEE);  Surgeon: Laurann Pollock, MD;  [facility];  Service: Endoscopy;  Laterality: N/A;   TOTAL KNEE ARTHROPLASTY Right 08/25/2017   Procedure: TOTAL RIGHT KNEE ARTHROPLASTY;  Surgeon: Liliane Rei, MD;  Location: [facility];  Service: Orthopedics;  Laterality: Right;     reports that she has never smoked. She has never used smokeless tobacco. She reports that she does not drink alcohol and does not use drugs.  Family History  Problem Relation Age of Onset   Crohn's disease Brother    Depression Mother    Anxiety disorder Mother    Depression Sister    Aneurysm Father    Colon cancer Neg Hx      Physical Exam: Vitals:   10/19/23 0100 10/19/23 0130 10/19/23 0209 10/19/23 0300  BP: (!) 100/53 (!) 116/54 99/63   Pulse: 90 89 85   Resp: (!) 25 16 18    Temp:  99.4 F (37.4 C) 98.6 F (37 C)   TempSrc:  Oral Oral   SpO2: 92% 93% 98%   Weight:    68.2 kg  Height:    5\' 2"  (1.575 m)    Gen: Awake, alert, NAD   CV: Regular, normal S1, S2, no murmurs  Resp: Normal WOB, CTAB  Abd: Flat, hyperactive, nontender MSK: Symmetric, no edema  Skin: No rashes or lesions to exposed skin  Neuro: Alert and interactive  Psych: euthymic, appropriate    Data review:   Labs reviewed, notable for:   Lactate 0.8 Bicarb 21 Creatinine 1.6 (baseline 1.3-1.4) WBC 3.2, ANC 2.4, lymphocytes 0.5 Hemoglobin 8.5 UA trace ketones  Micro:  Results for orders placed or performed during the hospital encounter of 10/15/23  Resp panel by RT-PCR (RSV, Flu A&B, Covid) Anterior Nasal Swab     Status: None   Collection Time: 10/15/23  5:00 PM   Specimen:  Anterior Nasal Swab  Result Value Ref Range Status  SARS Coronavirus 2 by RT PCR NEGATIVE NEGATIVE Final    Comment: (NOTE) SARS-CoV-2 target nucleic acids are NOT DETECTED.  The SARS-CoV-2 RNA is generally detectable in upper respiratory specimens during the acute phase of infection. The lowest concentration of SARS-CoV-2 viral copies this assay can detect is 138 copies/mL. A negative result does not preclude SARS-Cov-2 infection and should not be used as the sole basis for treatment or other patient management decisions. A negative result may occur with  improper specimen collection/handling, submission of specimen other than nasopharyngeal swab, presence of viral mutation(s) within the areas targeted by this assay, and inadequate number of viral copies(<138 copies/mL). A negative result must be combined with clinical observations, patient history, and epidemiological information. The expected result is Negative.  Fact Sheet for Patients:  BloggerCourse.com  Fact Sheet for Healthcare Providers:  SeriousBroker.it  This test is no t yet approved or cleared by the [place]  FDA and  has been authorized for detection and/or diagnosis of SARS-CoV-2 by FDA under an Emergency Use Authorization (EUA). This EUA will remain  in effect (meaning this test can be used) for the duration of the COVID-19 declaration under Section 564(b)(1) of the Act, 21 U.S.C.section 360bbb-3(b)(1), unless the authorization is terminated  or revoked sooner.       Influenza A by PCR NEGATIVE NEGATIVE Final   Influenza B by PCR NEGATIVE NEGATIVE Final    Comment: (NOTE) The Xpert Xpress SARS-CoV-2/FLU/RSV plus assay is intended as an aid in the diagnosis of influenza from Nasopharyngeal swab specimens and should not be used as a sole basis for treatment. Nasal washings and aspirates are unacceptable for Xpert Xpress SARS-CoV-2/FLU/RSV testing.  Fact  Sheet for Patients: BloggerCourse.com  Fact Sheet for Healthcare Providers: SeriousBroker.it  This test is not yet approved or cleared by the [place]  FDA and has been authorized for detection and/or diagnosis of SARS-CoV-2 by FDA under an Emergency Use Authorization (EUA). This EUA will remain in effect (meaning this test can be used) for the duration of the COVID-19 declaration under Section 564(b)(1) of the Act, 21 U.S.C. section 360bbb-3(b)(1), unless the authorization is terminated or revoked.     Resp Syncytial Virus by PCR NEGATIVE NEGATIVE Final    Comment: (NOTE) Fact Sheet for Patients: BloggerCourse.com  Fact Sheet for Healthcare Providers: SeriousBroker.it  This test is not yet approved or cleared by the [place]  FDA and has been authorized for detection and/or diagnosis of SARS-CoV-2 by FDA under an Emergency Use Authorization (EUA). This EUA will remain in effect (meaning this test can be used) for the duration of the COVID-19 declaration under Section 564(b)(1) of the Act, 21 U.S.C. section 360bbb-3(b)(1), unless the authorization is terminated or revoked.  Performed at [facility], [address], [place], [place] [address]     Imaging reviewed:  CT ABDOMEN PELVIS WO CONTRAST Result Date: 10/19/2023 CLINICAL DATA:  Fever and diarrhea for 1 week EXAM: CT ABDOMEN AND PELVIS WITHOUT CONTRAST TECHNIQUE: Multidetector CT imaging of the abdomen and pelvis was performed following the standard protocol without IV contrast. RADIATION DOSE REDUCTION: This exam was performed according to the departmental dose-optimization program which includes automated exposure control, adjustment of the mA and/or kV according to patient size and/or use of iterative reconstruction technique. COMPARISON:  10/15/2023 FINDINGS: Lower chest: Lung bases show no focal infiltrate or sizable  effusion. Hepatobiliary: Scattered calcifications are noted within the liver stable from the prior exam. Mild nodularity of the liver is noted stable from the prior  study. Gallbladder has been surgically removed. Pancreas: Unremarkable. No pancreatic ductal dilatation or surrounding inflammatory changes. Spleen: Scattered calcified granulomas are noted within the spleen. Small accessory splenule is noted adjacent to the left kidney. Adrenals/Urinary Tract: Adrenal glands are within normal limits. Small cyst is noted within the right kidney stable from the prior exam. No further follow-up is recommended. No renal calculi or obstructive changes are noted. The bladder is well distended. Stomach/Bowel: No obstructive or inflammatory changes of the colon are seen. No significant colonic fluid is noted to correspond with the given clinical history. Postsurgical changes in the right colon consistent with prior resection are noted and stable. Small bowel shows no obstructive change. Stomach is within normal limits. Vascular/Lymphatic: Aortic atherosclerosis. No enlarged abdominal or pelvic lymph nodes. Reproductive: Status post hysterectomy. No adnexal masses. Other: No abdominal wall hernia or abnormality. No abdominopelvic ascites. Musculoskeletal: Right hip replacement is noted. Degenerative changes of the lumbar spine are seen. IMPRESSION: Chronic changes as described above similar to that seen on prior exam. No acute abnormality is noted to correspond with the given clinical history. Emergent results were called by telephone at the time of interpretation on 10/19/2023 at 12:44 am to Dr. Debria Fang TEE , who verbally acknowledged these results. Electronically Signed   By: Violeta Grey M.D.   On: 10/19/2023 00:47   DG Chest Port 1 View Result Date: 10/18/2023 CLINICAL DATA:  Sepsis EXAM: PORTABLE CHEST 1 VIEW COMPARISON:  None Available. FINDINGS: No definite evidence of lobar infiltrate. Heart mediastinum are not  significantly changed. IMPRESSION: 1. No definite evidence of lobar infiltrate, pleural effusion, or pneumothorax. Electronically Signed   By: Reagan Camera M.D.   On: 10/18/2023 14:09     ED Course:  Treated with vancomycin, cefepime, Flagyl, 2.5 L IV fluid.  Otherwise given fentanyl, Tylenol.  Transferred from [facility] ED for further care of sepsis   Assessment/Plan:  73 y.o. female with hx hx of chron's disease with prior right hemicolectomy, immunosuppressed, leukopenia, PE on AC, cirrhosis by imaging, hypertension, CKD stage III, hypothyroidism, IDA, mood disorder, who was transferred from [facility] ED due to sepsis with ? GI / respiratory source.   Sepsis, suspect viral GI/respiratory source 2 weeks of URI/LRI symptoms followed by 1 week of GI symptoms with N/V/D.  Is immunosuppressed currently on Rinvoq for Crohn's disease. On initial evaluation febrile to Tmax 38.9C, tachypneic, borderline hypotensive.  Leukopenic which is chronic WBC 3.2, with prominent lymphopenia 0.5.  Lactate 0.8.  Flu/COVID/RSV pending.  UA not consistent with infection. LFT wnl.  CT abdomen pelvis with no acute abnormalities.  Suspect that she has a viral source with a constellation of URI/LRI/GI symptoms, however with her immunosuppression will cover broadly for potential bacterial sepsis. - Continue vancomycin pharmacy to dose, cefepime 2 g IV every 12 hours (renally dosed), Flagyl 500 mg IV every 12 hours for now - S/p 2.5 L IV fluid.  Started on maintenance IV fluid with LR at 75 cc an hour until taking adequate p.o.'s.  Continue oral hydration. - Follow-up blood cultures.  Follow-up flu/COVID/RSV. check RVP, sputum culture, C. difficile, GI pathogen panel. - Hold her home Rinvoq in the in the setting of infection.  Chronic medical problems: History of Crohn's disease, prior right hemicolectomy, immunosuppressed: Holding home Rinvoq.  Continue home Bentyl as needed.  Outpatient f/u with GI  Leukopenia: Suspect related to  immunosuppressive agent, follow-up blood counts outpatient. History of PE: Continue home apixaban Cirrhosis by imaging: Follow-up GI outpatient. Hypertension: Currently  with borderline hypotension.  Hold her home amlodipine, spironolactone for now. CKD stage IIIb: Baseline creatinine 1.3-1.4.  Near baseline at 1.6 on admission. Hypothyroidism: Continue home levothyroxine IDA: Hold home iron given GI upset Mood disorder: Continue her home bupropion ?  RLS: Continue ropinirole, reduced to 3 times daily Chronic pain: Continue home hydrocodone-Tylenol TID prn  OSA: Not currently using CPAP   Body mass index is 27.5 kg/m.    DVT prophylaxis:  Eliquis Code Status:  Full Code Diet:  Diet Orders (From admission, onward)     Start     Ordered   10/19/23 0513  Diet clear liquid Fluid consistency: Thin  Diet effective now       Question:  Fluid consistency:  Answer:  Thin   10/19/23 0513           Family Communication:  No   Consults:  None   Admission status:   Observation, Telemetry bed  Severity of Illness: The appropriate patient status for this patient is OBSERVATION. Observation status is judged to be reasonable and necessary in order to provide the required intensity of service to ensure the patient's safety. The patient's presenting symptoms, physical exam findings, and initial radiographic and laboratory data in the context of their medical condition is felt to place them at decreased risk for further clinical deterioration. Furthermore, it is anticipated that the patient will be medically stable for discharge from the hospital within 2 midnights of admission.    Arnulfo Larch, MD [facility]  How to contact the [facility] Attending or Consulting provider 7A - 7P or covering provider during after hours 7P -7A, for this patient.  Check the care team in [facility] and look for a) attending/consulting [facility] provider listed and b) the [facility] team listed Log into [email or web address] and use  [place]'s universal password to access. If you do not have the password, please contact the hospital operator. Locate the [facility] provider you are looking for under [facility] and page to a number that you can be directly reached. If you still have difficulty reaching the provider, please page the [facility] (Director on Call) for the Hospitalists listed on amion for assistance.  10/19/2023, 6:27 AM

## 2023-10-19 NOTE — Plan of Care (Signed)

## 2023-10-19 NOTE — Plan of Care (Signed)
  Problem: Education: Goal: Knowledge of General Education information will improve Description: Including pain rating scale, medication(s)/side effects and non-pharmacologic comfort measures Outcome: Progressing   Problem: Health Behavior/Discharge Planning: Goal: Ability to manage health-related needs will improve Outcome: Progressing   Problem: Clinical Measurements: Goal: Ability to maintain clinical measurements within normal limits will improve Outcome: Progressing Goal: Will remain free from infection Outcome: Progressing Goal: Diagnostic test results will improve Outcome: Progressing Goal: Respiratory complications will improve Outcome: Progressing Goal: Cardiovascular complication will be avoided Outcome: Progressing   Problem: Activity: Goal: Risk for activity intolerance will decrease Outcome: Progressing   Problem: Coping: Goal: Level of anxiety will decrease Outcome: Progressing   Problem: Elimination: Goal: Will not experience complications related to bowel motility Outcome: Progressing Goal: Will not experience complications related to urinary retention Outcome: Progressing   Problem: Safety: Goal: Ability to remain free from injury will improve Outcome: Progressing   Problem: Skin Integrity: Goal: Risk for impaired skin integrity will decrease Outcome: Progressing   Problem: Nutrition: Goal: Adequate nutrition will be maintained Outcome: Not Progressing   Problem: Pain Managment: Goal: General experience of comfort will improve and/or be controlled Outcome: Not Progressing

## 2023-10-19 NOTE — Care Management Obs Status (Signed)
 MEDICARE OBSERVATION STATUS NOTIFICATION   Patient Details  Name: Jessica [name] MRN: [id number] Date of Birth: [birth year]   Medicare Observation Status Notification Given:  Yes    Jerusha Reising Liane Redman, LCSW 10/19/2023, 10:36 AM

## 2023-10-19 NOTE — Progress Notes (Signed)
   10/19/23 1037  TOC Brief Assessment  Insurance and Status Reviewed  Patient has primary care physician Yes  Home environment has been reviewed Home with spouse  Prior level of function: Independent  Prior/Current Home Services No current home services  Social Drivers of Health Review SDOH reviewed no interventions necessary  Readmission risk has been reviewed Yes  Transition of care needs no transition of care needs at this time

## 2023-10-19 NOTE — ED Provider Notes (Signed)
 Presents to follow-up on the patient's labs with plan to admit after labs return.  The patient's x-rays were negative and labs were overall reassuring.  She was still having nausea and vomiting and was complaining of some abdominal pain so I did obtain a CT scan of her abdomen without contrast.  I did not appreciate anything on my read but it does appear that she may have some lower lobe infiltrates.  The patient's temperature is elevated and she is still febrile here so she will require admission for further evaluation and management.  Blood pressures have improved and I have treated the patient pain with fentanyl after IV fluid bolus.  Physical Exam  BP (!) 131/56 (BP Location: Right Arm)   Pulse 87   Temp (!) 100.8 F (38.2 C) (Oral)   Resp (!) 22   SpO2 96%   Physical Exam  Procedures  Procedures  ED Course / MDM   Clinical Course as of 10/19/23 0024  Sat Oct 18, 2023  1456 CBC reviewed interpreted significant for stable leukopenia and anemia [DR]  1456 Chest x-ray reviewed interpreted no evidence of acute infiltrate noted radiologist interpretation concurs [DR]    Clinical Course User Index [DR] Auston Blush, MD   Medical Decision Making Amount and/or Complexity of Data Reviewed Labs: ordered. Radiology: ordered.  Risk OTC drugs. Prescription drug management.          Carin Charleston, MD 10/19/23 [phone]

## 2023-10-19 NOTE — Care Plan (Addendum)
 This 73 years old female with PMH significant for Crohn's disease with prior right hemicolectomy, immunocompromised, leukopenia, PE on anticoagulation, cirrhosis by imaging, hypertension, CKD stage IIIb, hypothyroidism, iron deficiency anemia, mood disorder presented in the ED with persistent nausea and vomiting and diarrhea.  Patient reports after having endoscopy on 4/1 she began to develop nonproductive cough.  She was prescribed a course of Augmentin by GI for possible aspiration associated with the procedure.  She could not finish the antibiotics because she has developed nausea and vomiting.  She came to the ED on 4/9 and was discharged, she was also prescribed a course of doxycycline but is unclear if she has completed it.  She was found to be septic on clinical and lab work.  So far the cause has been unknown.  Patient is admitted and started on empiric antibiotics.  Assessment and plan: Sepsis , due to unknown etiology. Suspect viral GI /respiratory infection. Patient presented with 2 weeks history of URI /LRI symptoms followed by GI symptoms. She is immunocompromised and currently on Rinvoq for Crohn's disease. In ED She was febrile, tachypneic, tachycardic, hypotensive, leukopenic with WBC 3.2.  Lactic acid 0.8.  UA unremarkable.  LFTs normal, CT abdomen and pelvis no acute abnormalities found.  Suspect viral source but due to immunocompromise,  She is started on empiric antibiotics. Continue vancomycin cefepime and Flagyl and de-escalate as able to. Continue IV fluid resuscitation. Follow-up blood and urine cultures.  GI panel. Hold Rinvoq in the setting of infection.  History of Crohn's disease, prior right hemicolectomy, immunosuppressed:  Holding home Rinvoq.  Continue home Bentyl as needed.  Outpatient f/u with GI   Leukopenia:  Suspect related to immunosuppressive agent, follow-up blood counts outpatient.  History of PE:  Continue apixaban.  Cirrhosis by imaging:  Follow-up GI  outpatient.  Hypertension:  Currently with borderline hypotension.  Hold her home amlodipine, spironolactone for now.  CKD stage IIIb:  Baseline creatinine 1.3-1.4.  Near baseline at 1.6 on admission.  Hypothyroidism:  Continue  levothyroxine.  IDA: Hold home iron given GI upset.  Mood disorder:  Continue her home bupropion.  RLS:  Continue ropinirole, reduced to 3 times daily.  Chronic pain:  Continue home hydrocodone-Tylenol TID prn.  OSA: Not currently using CPAP.  Hypomagnesemia: Replaced.  Continue to monitor.  Patient was seen and examined at bedside.  Overnight events noted.  Patient appears anxious and states she wants anxiety medication.  Started on low-dose Xanax.

## 2023-10-19 NOTE — Progress Notes (Signed)
   10/19/23 1945  Assess: MEWS Score  Temp (!) 102.6 F (39.2 C)  BP 126/63  MAP (mmHg) 81  Pulse Rate 92  Resp 15  Level of Consciousness Alert  SpO2 96 %  O2 Device Room Air  Assess: MEWS Score  MEWS Temp 2  MEWS Systolic 0  MEWS Pulse 0  MEWS RR 0  MEWS LOC 0  MEWS Score 2  MEWS Score Color Yellow  Assess: if the MEWS score is Yellow or Red  Were vital signs accurate and taken at a resting state? Yes  Does the patient meet 2 or more of the SIRS criteria? Yes  Does the patient have a confirmed or suspected source of infection? No  MEWS guidelines implemented  Yes, yellow  Treat  MEWS Interventions Considered administering scheduled or prn medications/treatments as ordered  Take Vital Signs  Increase Vital Sign Frequency  Yellow: Q2hr x1, continue Q4hrs until patient remains green for 12hrs  Escalate  MEWS: Escalate Yellow: Discuss with charge nurse and consider notifying provider and/or RRT  Notify: Charge Nurse/RN  Name of Charge Nurse/RN Notified Ariel Rn  Provider Notification  Provider Name/Title J.Sharion Davidson  Date Provider Notified 10/19/23  Time Provider Notified 2000  Method of Notification Page  Notification Reason Other (Comment) (Yellow MEWS)  Provider response No new orders  Date of Provider Response 10/19/23  Time of Provider Response 2003  Notify: Rapid Response  Name of Rapid Response RN Notified NA  Assess: SIRS CRITERIA  SIRS Temperature  1  SIRS Respirations  0  SIRS Pulse 1  SIRS WBC 0  SIRS Score Sum  2   Gave PRN Tylenol, room temp turned down, removed heavy blankets. Pt refused ice c/o freezing and shivering. NP made aware continue to monitor.

## 2023-10-19 NOTE — Progress Notes (Signed)
 PHARMACY NOTE:  ANTIMICROBIAL RENAL DOSAGE ADJUSTMENT  Current antimicrobial regimen includes a mismatch between antimicrobial dosage and estimated renal function.  As per policy approved by the Pharmacy & Therapeutics and Medical Executive Committees, the antimicrobial dosage will be adjusted accordingly.  Current antimicrobial dosage:  cefepime 2 g IV q24h  Indication: sepsis with GI / respiratory source suspected  Renal Function:  Estimated Creatinine Clearance: 33.8 mL/min (A) (by C-G formula based on SCr of 1.38 mg/dL (H)). []      On intermittent HD, scheduled: []      On CRRT    Antimicrobial dosage has been changed to:  cefepime 2 g IV q12h  Additional comments:   Thank you for allowing pharmacy to be a part of this patient's care.  Delpha Fickle, [facility] 10/19/2023 11:05 AM

## 2023-10-19 NOTE — Progress Notes (Signed)
 Pharmacy Antibiotic Note  Jessica [name] is a 73 y.o. female admitted on 10/18/2023 with sepsis.  Patient received Vancomycin 1gm IV x 1 dose in the ED.  Pharmacy has been consulted for Vancomycin dosing.  Plan: Vancomycin 1250 mg IV Q 48 hrs. Goal AUC 400-550.  Expected AUC: 500.3  SCr used: 1.67 Cefepime dose adjusted to 2g IV q24h per renal dosing protocol (CrCl ~ 28 ml/min) Metronidazole per MD Follow renal function F/u culture results & sensitivities  Height: 5\' 2"  (157.5 cm) IBW/kg (Calculated) : 50.1  Temp (24hrs), Avg:99.7 F (37.6 C), Min:98.3 F (36.8 C), Max:102.1 F (38.9 C)  Recent Labs  Lab 10/15/23 1828 10/15/23 2022 10/18/23 1430  WBC 3.7*  --  3.2*  CREATININE 1.47*  --  1.67*  LATICACIDVEN 1.3 0.9 0.8    Estimated Creatinine Clearance: 28.4 mL/min (A) (by C-G formula based on SCr of 1.67 mg/dL (H)).    Allergies  Allergen Reactions   Morphine And Codeine Hives and Itching    May cause blood pressure to drop   Robaxin [Methocarbamol] Itching   Sulfa Antibiotics Itching    ALL SULFA DRUGS    Antimicrobials this admission: 4/12 Metronidazole >>   4/12 Cefepime >>   4/12 Vancomycin >>  Dose adjustments this admission:    Microbiology results: 4/12 BCx:      Thank you for allowing pharmacy to be a part of this patient's care.  Rulon Councilman, PharmD 10/19/2023 3:59 AM

## 2023-10-20 DIAGNOSIS — R509 Fever, unspecified: Secondary | ICD-10-CM | POA: Diagnosis not present

## 2023-10-20 LAB — MAGNESIUM: Magnesium: 2.3 mg/dL (ref 1.7–2.4)

## 2023-10-20 LAB — BASIC METABOLIC PANEL WITH GFR
Anion gap: 8 (ref 5–15)
BUN: 11 mg/dL (ref 8–23)
CO2: 18 mmol/L — ABNORMAL LOW (ref 22–32)
Calcium: 8.6 mg/dL — ABNORMAL LOW (ref 8.9–10.3)
Chloride: 107 mmol/L (ref 98–111)
Creatinine, Ser: 1.41 mg/dL — ABNORMAL HIGH (ref 0.44–1.00)
GFR, Estimated: 40 mL/min — ABNORMAL LOW (ref >60)
Glucose, Bld: 86 mg/dL (ref 70–99)
Potassium: 4.2 mmol/L (ref 3.5–5.1)
Sodium: 133 mmol/L — ABNORMAL LOW (ref 135–145)

## 2023-10-20 LAB — PHOSPHORUS: Phosphorus: 3.7 mg/dL (ref 2.5–4.6)

## 2023-10-20 MED ORDER — BOOST / RESOURCE BREEZE PO LIQD CUSTOM
1.0000 | Freq: Three times a day (TID) | ORAL | Status: DC
  Administered 2023-10-21 – 2023-10-25 (×7): 1 via ORAL

## 2023-10-20 MED ORDER — BOOST / RESOURCE BREEZE PO LIQD CUSTOM
1.0000 | Freq: Three times a day (TID) | ORAL | Status: DC
  Administered 2023-10-20: 1 via ORAL

## 2023-10-20 NOTE — Plan of Care (Signed)
  Problem: Education: Goal: Knowledge of General Education information will improve Description: Including pain rating scale, medication(s)/side effects and non-pharmacologic comfort measures Outcome: Progressing   Problem: Health Behavior/Discharge Planning: Goal: Ability to manage health-related needs will improve Outcome: Progressing   Problem: Clinical Measurements: Goal: Ability to maintain clinical measurements within normal limits will improve Outcome: Progressing Goal: Will remain free from infection Outcome: Progressing Goal: Diagnostic test results will improve Outcome: Progressing Goal: Respiratory complications will improve Outcome: Progressing Goal: Cardiovascular complication will be avoided Outcome: Progressing   Problem: Elimination: Goal: Will not experience complications related to bowel motility Outcome: Progressing Goal: Will not experience complications related to urinary retention Outcome: Progressing   Problem: Safety: Goal: Ability to remain free from injury will improve Outcome: Progressing   Problem: Skin Integrity: Goal: Risk for impaired skin integrity will decrease Outcome: Progressing   Problem: Activity: Goal: Risk for activity intolerance will decrease Outcome: Not Progressing   Problem: Nutrition: Goal: Adequate nutrition will be maintained Outcome: Not Progressing   Problem: Coping: Goal: Level of anxiety will decrease Outcome: Not Progressing   Problem: Pain Managment: Goal: General experience of comfort will improve and/or be controlled Outcome: Not Progressing

## 2023-10-20 NOTE — Plan of Care (Signed)

## 2023-10-20 NOTE — Progress Notes (Signed)
 PROGRESS NOTE    Jessica [name]  ZOX:[id number] DOB: Nov 16, 1950 DOA: 10/18/2023 PCP: Orlena Bitters, MD   Brief Narrative:  This 73 years old female with PMH significant for Crohn's disease with prior right hemicolectomy, immunocompromised, leukopenia, PE on anticoagulation, cirrhosis by imaging, hypertension, CKD stage IIIb, hypothyroidism, iron deficiency anemia, mood disorder presented in the ED with persistent nausea and vomiting and diarrhea.  Patient reports after having endoscopy on 4/1 . She began to develop nonproductive cough.  She was prescribed a course of Augmentin by GI for possible aspiration associated with the procedure.  She could not finish the antibiotics because she has developed nausea and vomiting.  She came to the ED on 4/9 and was discharged, she was also prescribed a course of doxycycline but is unclear if she has completed it.  She was found to be septic on clinical and lab work.  So far the cause has been unknown.  Patient is admitted and started on empiric antibiotics.  Assessment & Plan:   Principal Problem:   Fever   Sepsis , due to unknown etiology: Suspect viral GI /respiratory infection. Patient presented with 2 weeks history of URI /LRI symptoms followed by GI symptoms. She is immunocompromised and currently on Rinvoq for Crohn's disease. In ED She was febrile, tachypneic, tachycardic, hypotensive, leukopenic with WBC 3.2.  Lactic acid 0.8.  UA unremarkable.  LFTs normal, CT abdomen and pelvis no acute abnormalities found.  Suspect viral source but due to immunocompromise,  She is started on empiric antibiotics.  Stool for C. difficile negative. Continue Vancomycin,  cefepime and Flagyl and de-escalate as able to. Continue IV fluid resuscitation. Follow-up blood and urine cultures.  GI panel. Hold Rinvoq in the setting of infection.   History of Crohn's disease, prior right hemicolectomy, immunosuppressed:  Holding home Rinvoq.  Continue home Bentyl as  needed.  Outpatient f/u with GI.   Leukopenia:  Suspect related to immunosuppressive agent, follow-up blood counts outpatient.   History of PE:  Continue apixaban.   Cirrhosis by imaging:  Follow-up GI outpatient.   Hypertension:  Currently with borderline hypotension.   Hold her home amlodipine, spironolactone for now.   CKD stage IIIb:  Baseline creatinine 1.3-1.4.  Near baseline at 1.6 on admission.   Hypothyroidism:  Continue  levothyroxine.   IDA: Hold home iron given GI upset.   Mood disorder:  Continue her home bupropion.   RLS:  Continue ropinirole, reduced to 3 times daily.   Chronic pain:  Continue home hydrocodone-Tylenol TID prn.   OSA: Not currently using CPAP.   Hypomagnesemia: Replaced.  Continue to monitor.     DVT prophylaxis: Eliquis Code Status: Full code Family Communication: No family at bed side Disposition Plan:    Status is: Inpatient Remains inpatient appropriate because: Severity of illness likely sepsis from viral infection.   Consultants:  None  Procedures: CT A/P  Antimicrobials:  Anti-infectives (From admission, onward)    Start     Dose/Rate Route Frequency Ordered Stop   10/20/23 1500  vancomycin (VANCOREADY) IVPB 1250 mg/250 mL        1,250 mg 166.7 mL/hr over 90 Minutes Intravenous Every 48 hours 10/19/23 0402     10/19/23 1500  ceFEPIme (MAXIPIME) 2 g in sodium chloride 0.9 % 100 mL IVPB  Status:  Discontinued        2 g 200 mL/hr over 30 Minutes Intravenous Every 24 hours 10/19/23 0359 10/19/23 1105   10/19/23 1200  ceFEPIme (MAXIPIME) 2  g in sodium chloride 0.9 % 100 mL IVPB        2 g 200 mL/hr over 30 Minutes Intravenous Every 12 hours 10/19/23 1105     10/19/23 0430  ceFEPIme (MAXIPIME) 2 g in sodium chloride 0.9 % 100 mL IVPB  Status:  Discontinued        2 g 200 mL/hr over 30 Minutes Intravenous Every 12 hours 10/19/23 0340 10/19/23 0359   10/19/23 0400  metroNIDAZOLE (FLAGYL) IVPB 500 mg        500 mg 100  mL/hr over 60 Minutes Intravenous Every 12 hours 10/19/23 0340     10/18/23 1500  ceFEPIme (MAXIPIME) 2 g in sodium chloride 0.9 % 100 mL IVPB        2 g 200 mL/hr over 30 Minutes Intravenous  Once 10/18/23 1455 10/18/23 1658   10/18/23 1500  metroNIDAZOLE (FLAGYL) IVPB 500 mg        500 mg 100 mL/hr over 60 Minutes Intravenous  Once 10/18/23 1455 10/18/23 1658   10/18/23 1500  vancomycin (VANCOCIN) IVPB 1000 mg/200 mL premix        1,000 mg 200 mL/hr over 60 Minutes Intravenous  Once 10/18/23 1455 10/18/23 1853       Subjective: Seen and examined at bedside. Overnight events noted. Patient reports feeling better,  She had fever last night.   She reports still feeling nauseous,  throwing up and has diarrhea.  Objective: Vitals:   10/19/23 1700 10/19/23 1945 10/19/23 2115 10/20/23 0443  BP: 108/68 126/63  (!) 111/56  Pulse: 80 92  78  Resp: 20 15  16   Temp: 98 F (36.7 C) (!) 102.6 F (39.2 C) 100.3 F (37.9 C) 98.5 F (36.9 C)  TempSrc: Oral Oral Oral Oral  SpO2: 97% 96%  100%  Weight:      Height:       No intake or output data in the 24 hours ending 10/20/23 1206  Filed Weights   10/19/23 0300  Weight: 68.2 kg    Examination:  General exam: Appears calm and comfortable, not in any acute distress. Respiratory system: CTA Bilaterally . Respiratory effort normal.  RR 16 Cardiovascular system: S1 & S2 heard, RRR. No JVD, murmurs, rubs, gallops or clicks.  Gastrointestinal system: Abdomen is non distended, soft and non tender.  Normal bowel sounds heard. Central nervous system: Alert and oriented X 3. No focal neurological deficits. Extremities: No edema, no cyanosis, no clubbing. Skin: No rashes, lesions or ulcers Psychiatry: Judgement and insight appear normal. Mood & affect appropriate.     Data Reviewed: I have personally reviewed following labs and imaging studies  CBC: Recent Labs  Lab 10/15/23 1828 10/18/23 1430 10/19/23 0751  WBC 3.7* 3.2* 3.4*   NEUTROABS 2.1 2.4  --   HGB 8.7* 8.5* 8.1*  HCT 24.7* 23.6* 23.7*  MCV 93.6 92.5 96.7  PLT 185 176 183   Basic Metabolic Panel: Recent Labs  Lab 10/15/23 1828 10/18/23 1430 10/19/23 0751 10/20/23 0512  NA 131* 134* 135 133*  K 4.0 4.3 3.7 4.2  CL 102 105 108 107  CO2 19* 21* 17* 18*  GLUCOSE 85 83 88 86  BUN 13 15 11 11   CREATININE 1.47* 1.67* 1.38* 1.41*  CALCIUM 9.0 9.4 9.0 8.6*  MG  --   --  1.5* 2.3  PHOS  --   --  2.8 3.7   GFR: Estimated Creatinine Clearance: 33.1 mL/min (A) (by C-G formula based on SCr of  1.41 mg/dL (H)). Liver Function Tests: Recent Labs  Lab 10/15/23 1828 10/18/23 1430  AST 40 30  ALT 29 19  ALKPHOS 54 49  BILITOT 1.1 0.9  PROT 6.7 6.4*  ALBUMIN 4.0 4.1   No results for input(s): "LIPASE", "AMYLASE" in the last 168 hours. No results for input(s): "AMMONIA" in the last 168 hours. Coagulation Profile: Recent Labs  Lab 10/18/23 1430  INR 1.4*   Cardiac Enzymes: No results for input(s): "CKTOTAL", "CKMB", "CKMBINDEX", "TROPONINI" in the last 168 hours. BNP (last 3 results) No results for input(s): "PROBNP" in the last 8760 hours. HbA1C: No results for input(s): "HGBA1C" in the last 72 hours. CBG: No results for input(s): "GLUCAP" in the last 168 hours. Lipid Profile: No results for input(s): "CHOL", "HDL", "LDLCALC", "TRIG", "CHOLHDL", "LDLDIRECT" in the last 72 hours. Thyroid Function Tests: No results for input(s): "TSH", "T4TOTAL", "FREET4", "T3FREE", "THYROIDAB" in the last 72 hours. Anemia Panel: No results for input(s): "VITAMINB12", "FOLATE", "FERRITIN", "TIBC", "IRON", "RETICCTPCT" in the last 72 hours. Sepsis Labs: Recent Labs  Lab 10/15/23 1828 10/15/23 2022 10/18/23 1430  LATICACIDVEN 1.3 0.9 0.8    Recent Results (from the past 240 hours)  Resp panel by RT-PCR (RSV, Flu A&B, Covid) Anterior Nasal Swab     Status: None   Collection Time: 10/15/23  5:00 PM   Specimen: Anterior Nasal Swab  Result Value Ref Range  Status   SARS Coronavirus 2 by RT PCR NEGATIVE NEGATIVE Final    Comment: (NOTE) SARS-CoV-2 target nucleic acids are NOT DETECTED.  The SARS-CoV-2 RNA is generally detectable in upper respiratory specimens during the acute phase of infection. The lowest concentration of SARS-CoV-2 viral copies this assay can detect is 138 copies/mL. A negative result does not preclude SARS-Cov-2 infection and should not be used as the sole basis for treatment or other patient management decisions. A negative result may occur with  improper specimen collection/handling, submission of specimen other than nasopharyngeal swab, presence of viral mutation(s) within the areas targeted by this assay, and inadequate number of viral copies(<138 copies/mL). A negative result must be combined with clinical observations, patient history, and epidemiological information. The expected result is Negative.  Fact Sheet for Patients:  BloggerCourse.com  Fact Sheet for Healthcare Providers:  SeriousBroker.it  This test is no t yet approved or cleared by the [place] FDA and  has been authorized for detection and/or diagnosis of SARS-CoV-2 by FDA under an Emergency Use Authorization (EUA). This EUA will remain  in effect (meaning this test can be used) for the duration of the COVID-19 declaration under Section 564(b)(1) of the Act, 21 U.S.C.section 360bbb-3(b)(1), unless the authorization is terminated  or revoked sooner.       Influenza A by PCR NEGATIVE NEGATIVE Final   Influenza B by PCR NEGATIVE NEGATIVE Final    Comment: (NOTE) The Xpert Xpress SARS-CoV-2/FLU/RSV plus assay is intended as an aid in the diagnosis of influenza from Nasopharyngeal swab specimens and should not be used as a sole basis for treatment. Nasal washings and aspirates are unacceptable for Xpert Xpress SARS-CoV-2/FLU/RSV testing.  Fact Sheet for  Patients: BloggerCourse.com  Fact Sheet for Healthcare Providers: SeriousBroker.it  This test is not yet approved or cleared by the [place] FDA and has been authorized for detection and/or diagnosis of SARS-CoV-2 by FDA under an Emergency Use Authorization (EUA). This EUA will remain in effect (meaning this test can be used) for the duration of the COVID-19 declaration under Section 564(b)(1) of the  Act, 21 U.S.C. section 360bbb-3(b)(1), unless the authorization is terminated or revoked.     Resp Syncytial Virus by PCR NEGATIVE NEGATIVE Final    Comment: (NOTE) Fact Sheet for Patients: BloggerCourse.com  Fact Sheet for Healthcare Providers: SeriousBroker.it  This test is not yet approved or cleared by the [place]  FDA and has been authorized for detection and/or diagnosis of SARS-CoV-2 by FDA under an Emergency Use Authorization (EUA). This EUA will remain in effect (meaning this test can be used) for the duration of the COVID-19 declaration under Section 564(b)(1) of the Act, 21 U.S.C. section 360bbb-3(b)(1), unless the authorization is terminated or revoked.  Performed at [facility], [address], [place], [place] [address]   Blood Culture (routine x 2)     Status: None (Preliminary result)   Collection Time: 10/18/23  2:31 PM   Specimen: BLOOD  Result Value Ref Range Status   Specimen Description   Final    BLOOD RIGHT ANTECUBITAL Performed at [facility], [address] [place], [place] [address]    Special Requests   Final    BOTTLES DRAWN AEROBIC AND ANAEROBIC Blood Culture adequate volume Performed at [facility], [address] [place], [place] [address]    Culture   Final    NO GROWTH 2 DAYS Performed at [facility], [address], [place], [place] [address]    Report Status PENDING  Incomplete   Blood Culture (routine x 2)     Status: None (Preliminary result)   Collection Time: 10/18/23  2:32 PM   Specimen: BLOOD LEFT ARM  Result Value Ref Range Status   Specimen Description   Final    BLOOD LEFT ARM Performed at [facility], [address], [place], [place] [address]    Special Requests   Final    BOTTLES DRAWN AEROBIC AND ANAEROBIC Blood Culture adequate volume Performed at [facility], [address] [place], [place] [address]    Culture   Final    NO GROWTH 2 DAYS Performed at [facility], [address], [place], [place] [address]    Report Status PENDING  Incomplete  Respiratory (~20 pathogens) panel by PCR     Status: None   Collection Time: 10/19/23  4:57 AM   Specimen: Nasopharyngeal Swab; Respiratory  Result Value Ref Range Status   Adenovirus NOT DETECTED NOT DETECTED Final   Coronavirus 229E NOT DETECTED NOT DETECTED Final    Comment: (NOTE) The Coronavirus on the Respiratory Panel, DOES NOT test for the novel  Coronavirus (2019 nCoV)    Coronavirus HKU1 NOT DETECTED NOT DETECTED Final   Coronavirus NL63 NOT DETECTED NOT DETECTED Final   Coronavirus OC43 NOT DETECTED NOT DETECTED Final   Metapneumovirus NOT DETECTED NOT DETECTED Final   Rhinovirus / Enterovirus NOT DETECTED NOT DETECTED Final   Influenza A NOT DETECTED NOT DETECTED Final   Influenza B NOT DETECTED NOT DETECTED Final   Parainfluenza Virus 1 NOT DETECTED NOT DETECTED Final   Parainfluenza Virus 2 NOT DETECTED NOT DETECTED Final   Parainfluenza Virus 3 NOT DETECTED NOT DETECTED Final   Parainfluenza Virus 4 NOT DETECTED NOT DETECTED Final   Respiratory Syncytial Virus NOT DETECTED NOT DETECTED Final   Bordetella pertussis NOT DETECTED NOT DETECTED Final   Bordetella Parapertussis NOT DETECTED NOT DETECTED Final   Chlamydophila pneumoniae NOT DETECTED NOT DETECTED Final   Mycoplasma pneumoniae NOT DETECTED NOT DETECTED Final    Comment:  Performed  at [facility], [address] [place], [place] [address]  C Difficile Quick Screen w PCR reflex     Status: None   Collection Time: 10/19/23 11:55 AM   Specimen: Stool  Result Value Ref Range Status   C Diff antigen NEGATIVE NEGATIVE Final   C Diff toxin NEGATIVE NEGATIVE Final   C Diff interpretation No C. difficile detected.  Final    Comment: Performed at [facility], [address], [place], [place] [address]    Radiology Studies: CT ABDOMEN PELVIS WO CONTRAST Result Date: 10/19/2023 CLINICAL DATA:  Fever and diarrhea for 1 week EXAM: CT ABDOMEN AND PELVIS WITHOUT CONTRAST TECHNIQUE: Multidetector CT imaging of the abdomen and pelvis was performed following the standard protocol without IV contrast. RADIATION DOSE REDUCTION: This exam was performed according to the departmental dose-optimization program which includes automated exposure control, adjustment of the mA and/or kV according to patient size and/or use of iterative reconstruction technique. COMPARISON:  10/15/2023 FINDINGS: Lower chest: Lung bases show no focal infiltrate or sizable effusion. Hepatobiliary: Scattered calcifications are noted within the liver stable from the prior exam. Mild nodularity of the liver is noted stable from the prior study. Gallbladder has been surgically removed. Pancreas: Unremarkable. No pancreatic ductal dilatation or surrounding inflammatory changes. Spleen: Scattered calcified granulomas are noted within the spleen. Small accessory splenule is noted adjacent to the left kidney. Adrenals/Urinary Tract: Adrenal glands are within normal limits. Small cyst is noted within the right kidney stable from the prior exam. No further follow-up is recommended. No renal calculi or obstructive changes are noted. The bladder is well distended. Stomach/Bowel: No obstructive or inflammatory changes of the colon are seen. No significant colonic fluid is noted to correspond with the  given clinical history. Postsurgical changes in the right colon consistent with prior resection are noted and stable. Small bowel shows no obstructive change. Stomach is within normal limits. Vascular/Lymphatic: Aortic atherosclerosis. No enlarged abdominal or pelvic lymph nodes. Reproductive: Status post hysterectomy. No adnexal masses. Other: No abdominal wall hernia or abnormality. No abdominopelvic ascites. Musculoskeletal: Right hip replacement is noted. Degenerative changes of the lumbar spine are seen. IMPRESSION: Chronic changes as described above similar to that seen on prior exam. No acute abnormality is noted to correspond with the given clinical history. Emergent results were called by telephone at the time of interpretation on 10/19/2023 at 12:44 am to Dr. Debria Fang TEE , who verbally acknowledged these results. Electronically Signed   By: Violeta Grey M.D.   On: 10/19/2023 00:47   DG Chest Port 1 View Result Date: 10/18/2023 CLINICAL DATA:  Sepsis EXAM: PORTABLE CHEST 1 VIEW COMPARISON:  None Available. FINDINGS: No definite evidence of lobar infiltrate. Heart mediastinum are not significantly changed. IMPRESSION: 1. No definite evidence of lobar infiltrate, pleural effusion, or pneumothorax. Electronically Signed   By: Reagan Camera M.D.   On: 10/18/2023 14:09   Scheduled Meds:  apixaban  5 mg Oral BID   buPROPion  150 mg Oral q morning   dicyclomine  10 mg Oral TID AC   feeding supplement  1 Container Oral TID BM   levothyroxine  100 mcg Oral Daily   metoprolol succinate  12.5 mg Oral Daily   rOPINIRole  0.5 mg Oral TID   rosuvastatin  5 mg Oral Daily   sodium chloride flush  3 mL Intravenous Q12H   Continuous Infusions:  ceFEPime (MAXIPIME) IV 2 g (10/20/23 1018)   metronidazole 500 mg (10/20/23 0519)  vancomycin       LOS: 1 day    Time spent: 50 mins    Magdalene School, MD [facility]   If 7PM-7AM, please contact night-coverage

## 2023-10-20 NOTE — Progress Notes (Signed)
 PCR check to assess Crohn's disease activity

## 2023-10-21 DIAGNOSIS — R509 Fever, unspecified: Secondary | ICD-10-CM | POA: Diagnosis not present

## 2023-10-21 LAB — RESPIRATORY PANEL BY PCR

## 2023-10-21 LAB — GASTROINTESTINAL PANEL BY PCR, STOOL (REPLACES STOOL CULTURE)

## 2023-10-21 LAB — MRSA NEXT GEN BY PCR, NASAL: MRSA by PCR Next Gen: NOT DETECTED

## 2023-10-21 MED ORDER — BENZONATATE 100 MG PO CAPS
200.0000 mg | ORAL_CAPSULE | Freq: Three times a day (TID) | ORAL | Status: DC | PRN
  Administered 2023-10-21 – 2023-10-25 (×8): 200 mg via ORAL
  Filled 2023-10-21 (×10): qty 2

## 2023-10-21 NOTE — TOC Progression Note (Signed)
 Transition of Care [facility]) - Progression Note    Patient Details  Name: Jessica [name] MRN: [id number] Date of Birth: [birth year]  [facility] CM/SW Contact  Marty Sleet, LCSW Phone Number: 10/21/2023, 2:57 PM  Clinical Narrative:    Met with pt at bedside and confirmed plan for HHPT. Pt shares she has had HH in the past and does not have a preference for HHA. HHPT has been arranged with [facility]. HH order will need to be placed prior to discharge.    Expected Discharge Plan: Home/Self Care Barriers to Discharge: Continued Medical Work up  Expected Discharge Plan and Services                                   HH Arranged: PT [facility] Agency: [facility] Date [facility] Agency Contacted: 10/21/23 Time HH Agency Contacted: 1457 Representative spoke with at [facility] Agency: Cindie   Social Determinants of Health (SDOH) Interventions SDOH Screenings   Food Insecurity: No Food Insecurity (10/19/2023)  Housing: Low Risk  (10/19/2023)  Transportation Needs: No Transportation Needs (10/19/2023)  Utilities: Not At Risk (10/19/2023)  Depression (PHQ2-9): Low Risk  (05/21/2023)  Social Connections: Socially Integrated (07/14/2023)  Tobacco Use: Low Risk  (10/18/2023)    Readmission Risk Interventions    10/21/2023    2:56 PM  Readmission Risk Prevention Plan  Transportation Screening Complete  PCP or Specialist Appt within 3-5 Days Complete  HRI or Home Care Consult Complete  Social Work Consult for Recovery Care Planning/Counseling Complete  Palliative Care Screening Not Applicable  Medication Review [employer]) Complete

## 2023-10-21 NOTE — Plan of Care (Signed)
  Problem: Education: Goal: Knowledge of General Education information will improve Description: Including pain rating scale, medication(s)/side effects and non-pharmacologic comfort measures Outcome: Progressing   Problem: Clinical Measurements: Goal: Will remain free from infection Outcome: Progressing Goal: Diagnostic test results will improve Outcome: Progressing   Problem: Activity: Goal: Risk for activity intolerance will decrease Outcome: Progressing   Problem: Nutrition: Goal: Adequate nutrition will be maintained Outcome: Progressing   Problem: Elimination: Goal: Will not experience complications related to urinary retention Outcome: Progressing   Problem: Pain Managment: Goal: General experience of comfort will improve and/or be controlled Outcome: Progressing

## 2023-10-21 NOTE — Evaluation (Signed)
 Physical Therapy Evaluation Patient Details Name: Jessica [name] MRN: [id number] DOB: 1950-11-10 Today's Date: 10/21/2023  History of Present Illness  73 yo female admitted with N/V, fever, sepsis-unknown etiology. Hx including but not limited to: Crohn's disease, immunocompromised, PE, cirrhosis, hypothyroidism, TKA, vertigo, falls, neuropathy, chronic pain  Clinical Impression  On eval, pt was Min A for mobility. She walked ~40 feet with a RW. Pt presents with general weakness, decreased activity tolerance, and impaired gait and balance. Will plan to follow pt acutely. Recommend HHPT f/u after discharge.        If plan is discharge home, recommend the following: A little help with walking and/or transfers;A little help with bathing/dressing/bathroom;Assistance with cooking/housework;Assist for transportation;Help with stairs or ramp for entrance   Can travel by private vehicle        Equipment Recommendations None recommended by PT  Recommendations for Other Services       Functional Status Assessment Patient has had a recent decline in their functional status and demonstrates the ability to make significant improvements in function in a reasonable and predictable amount of time.     Precautions / Restrictions Precautions Precautions: Fall Precaution/Restrictions Comments: monitor BP Restrictions Weight Bearing Restrictions Per Provider Order: No      Mobility  Bed Mobility Overal bed mobility: Needs Assistance Bed Mobility: Supine to Sit, Sit to Supine     Supine to sit: Supervision, HOB elevated Sit to supine: Supervision, HOB elevated   General bed mobility comments: Increased time. Supv for safety, lines.    Transfers Overall transfer level: Needs assistance Equipment used: None, Rolling walker (2 wheels) Transfers: Sit to/from Stand Sit to Stand: Contact guard assist           General transfer comment: Increased time. Unsteady     Ambulation/Gait Ambulation/Gait assistance: Min Chemical engineer (Feet): 40 Feet Assistive device: Rolling walker (2 wheels) Gait Pattern/deviations: Step-through pattern, Decreased stride length       General Gait Details: Unsteady. Weak. Lightheaded. Intermittent assist to steady.  Stairs            Wheelchair Mobility     Tilt Bed    Modified Rankin (Stroke Patients Only)       Balance Overall balance assessment: Needs assistance         Standing balance support: Bilateral upper extremity supported, During functional activity, Reliant on assistive device for balance Standing balance-Leahy Scale: Fair                               Pertinent Vitals/Pain Pain Assessment Pain Assessment: No/denies pain    Home Living Family/patient expects to be discharged to:: Private residence Living Arrangements: Spouse/significant other Available Help at Discharge: Family Type of Home: House Home Access: Stairs to enter Entrance Stairs-Rails: Can reach both;Right;Left Entrance Stairs-Number of Steps: 6   Home Layout: One level Home Equipment: Agricultural consultant (2 wheels);Rollator (4 wheels);Wheelchair - manual;Cane - single point;BSC/3in1      Prior Function Prior Level of Function : Independent/Modified Independent             Mobility Comments: independent with PRN use of SPC or RW ADLs Comments: Independent     Extremity/Trunk Assessment   Upper Extremity Assessment Upper Extremity Assessment: Overall WFL for tasks assessed    Lower Extremity Assessment Lower Extremity Assessment: Generalized weakness    Cervical / Trunk Assessment Cervical / Trunk Assessment: Normal  Communication   Communication  Communication: No apparent difficulties    Cognition Arousal: Alert Behavior During Therapy: WFL for tasks assessed/performed   PT - Cognitive impairments: No apparent impairments                       PT - Cognition  Comments: a bit frustrated Following commands: Intact       Cueing Cueing Techniques: Verbal cues     General Comments      Exercises     Assessment/Plan    PT Assessment Patient needs continued PT services  PT Problem List Decreased strength;Decreased range of motion;Decreased activity tolerance;Decreased balance;Decreased mobility;Decreased knowledge of use of DME       PT Treatment Interventions DME instruction;Gait training;Functional mobility training;Therapeutic activities;Therapeutic exercise;Patient/family education;Balance training    PT Goals (Current goals can be found in the Care Plan section)  Acute Rehab PT Goals Patient Stated Goal: to feel better PT Goal Formulation: With patient/family Time For Goal Achievement: 11/04/23 Potential to Achieve Goals: Good    Frequency Min 3X/week     Co-evaluation               AM-PAC PT "6 Clicks" Mobility  Outcome Measure Help needed turning from your back to your side while in a flat bed without using bedrails?: None Help needed moving from lying on your back to sitting on the side of a flat bed without using bedrails?: None Help needed moving to and from a bed to a chair (including a wheelchair)?: A Little Help needed standing up from a chair using your arms (e.g., wheelchair or bedside chair)?: A Little Help needed to walk in hospital room?: A Little Help needed climbing 3-5 steps with a railing? : A Little 6 Click Score: 20    End of Session Equipment Utilized During Treatment: Gait belt Activity Tolerance: Patient tolerated treatment well;Patient limited by fatigue Patient left: in bed;with call bell/phone within reach;with family/visitor present   PT Visit Diagnosis: Muscle weakness (generalized) (M62.81);Difficulty in walking, not elsewhere classified (R26.2)    Time: 1610-9604 PT Time Calculation (min) (ACUTE ONLY): 18 min   Charges:   PT Evaluation $PT Eval Low Complexity: 1 Low   PT General  Charges $$ ACUTE PT VISIT: 1 Visit            Tanda Falter, PT Acute Rehabilitation  Office: [phone]

## 2023-10-21 NOTE — Progress Notes (Signed)
 Progress Note   Patient: Jessica [name] ZOX:[id number] DOB: 12-Apr-1951 DOA: 10/18/2023     2 DOS: the patient was seen and examined on 10/21/2023   Brief hospital course: "73 years old female with PMH significant for Crohn's disease with prior right hemicolectomy, immunocompromised, leukopenia, PE on anticoagulation, cirrhosis by imaging, hypertension, CKD stage IIIb, hypothyroidism, iron deficiency anemia, mood disorder presented in the ED with persistent nausea and vomiting and diarrhea.  Patient reports after having endoscopy on 4/1 . She began to develop nonproductive cough.  She was prescribed a course of Augmentin by GI for possible aspiration associated with the procedure.  She could not finish the antibiotics because she has developed nausea and vomiting.  She came to the ED on 4/9 and was discharged, she was also prescribed a course of doxycycline but is unclear if she has completed it.  She was found to be septic on clinical and lab work.  So far the cause has been unknown.  Patient is admitted and started on empiric antibiotics. "  Further hospital course and management as outlined below.  I assumed care 10/21/23. Pt still feels poorly, but no fevers since 4/13.  Discussed with patient and husband and will monitor clinically off antibiotics at this point.  Chest full respiratory viral panel that appears was ordered but got cancelled.   Assessment and Plan:  Sepsis , due to unknown etiology: Suspect viral GI /respiratory infection. Patient presented with 2 weeks history of URI /LRI symptoms followed by GI symptoms. She is immunocompromised and currently on Rinvoq for Crohn's disease. In ED She was febrile, tachypneic, tachycardic, hypotensive, leukopenic with WBC 3.2.  Lactic acid 0.8.  UA unremarkable.  LFTs normal, CT abdomen and pelvis no acute abnormalities found.   Suspect viral source but due to immunocompromised status, pt was started on empiric broad spectrum IV antibiotics  (Vancomycin, Cefepime, Flagyl).  , Stool for C. difficile negative.  Full GI panel also negative. Completed sepsis IV fluids. Pt has completed 3 days Vanc/Cefepime/Flagyl --Blood cultures negative -- follow to final --Check MRSA -- negative -- d/c Vancomycin --Repeat CT abd/pelvis unremarkable -- d/c Cefepime and Flagyl --Check 20+ respiratory viral panel (appears got cancelled) --Monitor clinically off antibiotics with plan to d/c in 24-48 hours if stable & improving  Generalized weakness --PT evaluation   History of Crohn's disease, prior right hemicolectomy, immunosuppressed:  Holding home Rinvoq.  Continue home Bentyl as needed.  Outpatient f/u with GI.   Leukopenia:  Suspect related to immunosuppressive agent, follow-up blood counts outpatient.  Hypomagnesemia: Mg 1.5 on 4/13.  Resolved with replacement --Monitor Mg level and replace PRN  History of PE:  --Continue apixaban.   Cirrhosis by imaging:  --Follow-up GI outpatient.   Hypertension: currently with soft BP's, stable MAP's --Continue holding home amlodipine, spironolactone  --Resume regimen gradually as BP allows   CKD stage IIIb: Stable. Baseline creatinine 1.3-1.4, Cr was 1.6 on admission. --Monitor BMP --Renally dose meds and avoid nephrotoxins   Hypothyroidism:  --Continue  levothyroxine.   IDA:  --Hold home iron given GI upset.   Mood disorder:  --Continue home bupropion   RLS:  --Continue ropinirole, reduced to 3 times daily.   Chronic pain:  --Continue home hydrocodone-Tylenol TID prn.   OSA: Not currently using CPAP.         Subjective: Pt seen with husband at bedside this AM.  She reports not feeling any better.  Has ongoing dry cough, feels very weak and tired.  Denies abdominal  complaints today.  States she does not want to go back on Rinvoq or any of the new biologic drugs for her Crohn's disease.  We had in depth discussion about risks/benefits of continuing antibiotics, pt and  husband agreeable to monitor off antibiotics to hopefully avoid complications like C diff. We discussed having low threshold to re-start antibiotics if needed.   Physical Exam: Vitals:   10/20/23 1938 10/21/23 0517 10/21/23 0930 10/21/23 1309  BP: (!) 95/47 (!) 85/47 (!) 95/48 (!) 100/55  Pulse: 91 75 81 76  Resp: 18 18  15   Temp: 99.8 F (37.7 C) 98.5 F (36.9 C)  98.5 F (36.9 C)  TempSrc: Oral Oral    SpO2: 93% 97% 95% 99%  Weight:      Height:       General exam: awake, alert, no acute distress, mildly ill-appearing  HEENT: atraumatic, clear conjunctiva, anicteric sclera, moist mucus membranes, hearing grossly normal  Respiratory system: CTAB, no wheezes, rales or rhonchi, normal respiratory effort. Cardiovascular system: normal S1/S2, RRR, no JVD, murmurs, rubs, gallops, no pedal edema.   Gastrointestinal system: soft, NT, ND, no HSM felt, +bowel sounds. Central nervous system: A&O x 4. no gross focal neurologic deficits, normal speech Extremities: moves all , no edema, normal tone Skin: dry, intact, normal temperature, patchy ecchymosis of BUE's Psychiatry: depressed mood, congruent affect, judgement and insight appear normal   Data Reviewed:  Notable labs --   Na 133, bicarb 18, Cr 1.41 stable Ca 8.6  Last CBC WBC 3.4, Hbg 8.1  Family Communication: husband at bedside on rounds  Disposition: Status is: Inpatient Remains inpatient appropriate because: monitoring closely off antibiotics, still evaluating cause of sepsis, viral panel pending that was previously cancelled    Planned Discharge Destination: Home    Time spent: 52 minutes including time at bedside and in coordination of care with staff and consultants  Author: Montey Apa, DO 10/21/2023 2:25 PM  For on call review [email or web address].

## 2023-10-22 ENCOUNTER — Inpatient Hospital Stay (HOSPITAL_COMMUNITY)

## 2023-10-22 ENCOUNTER — Encounter (HOSPITAL_COMMUNITY): Payer: Self-pay | Admitting: Internal Medicine

## 2023-10-22 DIAGNOSIS — R112 Nausea with vomiting, unspecified: Secondary | ICD-10-CM

## 2023-10-22 HISTORY — DX: Nausea with vomiting, unspecified: R11.2

## 2023-10-22 LAB — BASIC METABOLIC PANEL WITH GFR
Anion gap: 7 (ref 5–15)
BUN: 9 mg/dL (ref 8–23)
CO2: 17 mmol/L — ABNORMAL LOW (ref 22–32)
Calcium: 8.4 mg/dL — ABNORMAL LOW (ref 8.9–10.3)
Chloride: 110 mmol/L (ref 98–111)
Creatinine, Ser: 1.22 mg/dL — ABNORMAL HIGH (ref 0.44–1.00)
GFR, Estimated: 47 mL/min — ABNORMAL LOW (ref >60)
Glucose, Bld: 79 mg/dL (ref 70–99)
Potassium: 3.6 mmol/L (ref 3.5–5.1)
Sodium: 134 mmol/L — ABNORMAL LOW (ref 135–145)

## 2023-10-22 LAB — OCCULT BLOOD X 1 CARD TO LAB, STOOL: Fecal Occult Bld: NEGATIVE

## 2023-10-22 LAB — CBC
HCT: 22.7 % — ABNORMAL LOW (ref 36.0–46.0)
Hemoglobin: 7.6 g/dL — ABNORMAL LOW (ref 12.0–15.0)
MCH: 33 pg (ref 26.0–34.0)
MCHC: 33.5 g/dL (ref 30.0–36.0)
MCV: 98.7 fL (ref 80.0–100.0)
Platelets: 119 10*3/uL — ABNORMAL LOW (ref 150–400)
RBC: 2.3 MIL/uL — ABNORMAL LOW (ref 3.87–5.11)
RDW: 13.8 % (ref 11.5–15.5)
WBC: 2.2 10*3/uL — ABNORMAL LOW (ref 4.0–10.5)
nRBC: 0 % (ref 0.0–0.2)

## 2023-10-22 LAB — TSH: TSH: 0.331 u[IU]/mL — ABNORMAL LOW (ref 0.350–4.500)

## 2023-10-22 MED ORDER — ALUM & MAG HYDROXIDE-SIMETH 200-200-20 MG/5ML PO SUSP
15.0000 mL | Freq: Four times a day (QID) | ORAL | Status: DC | PRN
  Administered 2023-10-22 – 2023-10-24 (×2): 15 mL via ORAL
  Filled 2023-10-22 (×2): qty 30

## 2023-10-22 MED ORDER — PANTOPRAZOLE SODIUM 40 MG IV SOLR
40.0000 mg | INTRAVENOUS | Status: DC
  Administered 2023-10-22: 40 mg via INTRAVENOUS
  Filled 2023-10-22 (×2): qty 10

## 2023-10-22 MED ORDER — HEPARIN (PORCINE) 25000 UT/250ML-% IV SOLN
1000.0000 [IU]/h | INTRAVENOUS | Status: AC
  Administered 2023-10-22: 1000 [IU]/h via INTRAVENOUS
  Filled 2023-10-22: qty 250

## 2023-10-22 MED ORDER — SODIUM CHLORIDE 0.9 % IV SOLN: INTRAVENOUS | Status: DC

## 2023-10-22 MED ORDER — GADOBUTROL 1 MMOL/ML IV SOLN
7.0000 mL | Freq: Once | INTRAVENOUS | Status: AC | PRN
  Administered 2023-10-22: 7 mL via INTRAVENOUS

## 2023-10-22 NOTE — Progress Notes (Signed)
 Physical Therapy Treatment Patient Details Name: Jessica [name] MRN: [id number] DOB: 1950-09-13 Today's Date: 10/22/2023   History of Present Illness 73 yo female admitted with N/V, fever, sepsis-unknown etiology. Hx including but not limited to: Crohn's disease, immunocompromised, PE, cirrhosis, hypothyroidism, TKA, vertigo, falls, neuropathy, chronic pain    PT Comments  Pt continues to report feeling bad but is agreeable to PT; tol incr amb distance today, continues to experience dry cough which appeared exacerbated with mobility; SpO2=99% on RA and HR 85. Continue PT in acute setting.    If plan is discharge home, recommend the following: A little help with walking and/or transfers;A little help with bathing/dressing/bathroom;Assistance with cooking/housework;Assist for transportation;Help with stairs or ramp for entrance   Can travel by private vehicle        Equipment Recommendations  None recommended by PT    Recommendations for Other Services       Precautions / Restrictions Precautions Precautions: Fall Recall of Precautions/Restrictions: Intact Restrictions Weight Bearing Restrictions Per Provider Order: No     Mobility  Bed Mobility   Bed Mobility: Supine to Sit, Sit to Supine     Supine to sit: Supervision Sit to supine: Supervision   General bed mobility comments: Increased time. Supv for safety, lines, bed flat    Transfers Overall transfer level: Needs assistance Equipment used: Rolling walker (2 wheels) Transfers: Sit to/from Stand Sit to Stand: Contact guard assist           General transfer comment: increased time, cues for hand placement and safety    Ambulation/Gait Ambulation/Gait assistance: Min assist Gait Distance (Feet): 150 Feet Assistive device: Rolling walker (2 wheels) Gait Pattern/deviations: Step-through pattern, Decreased stride length       General Gait Details: Unsteady. reports feeling Weak. Intermittent assist to steady  and maintain RW position   Stairs             Wheelchair Mobility     Tilt Bed    Modified Rankin (Stroke Patients Only)       Balance           Standing balance support: Bilateral upper extremity supported, During functional activity, Reliant on assistive device for balance Standing balance-Leahy Scale: Fair                              Hotel manager: No apparent difficulties  Cognition Arousal: Alert Behavior During Therapy: WFL for tasks assessed/performed   PT - Cognitive impairments: No apparent impairments                       PT - Cognition Comments: frustrated d/t ongoing medical issues/not feeling well Following commands: Intact      Cueing Cueing Techniques: Verbal cues  Exercises      General Comments General comments (skin integrity, edema, etc.): (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 and endorses multiple falls at home )      Pertinent Vitals/Pain Pain Assessment Pain Assessment: No/denies pain    Home Living                          Prior Function            PT Goals (current goals can now be found in the care plan section) Acute Rehab PT Goals Patient Stated Goal: to feel better PT Goal Formulation: With patient/family Time For Goal Achievement: 11/04/23  Potential to Achieve Goals: Good Progress towards PT goals: Progressing toward goals    Frequency    Min 3X/week      PT Plan      Co-evaluation              AM-PAC PT "6 Clicks" Mobility   Outcome Measure  Help needed turning from your back to your side while in a flat bed without using bedrails?: None Help needed moving from lying on your back to sitting on the side of a flat bed without using bedrails?: None Help needed moving to and from a bed to a chair (including a wheelchair)?: A Little Help needed standing up from a chair using your arms (e.g., wheelchair or bedside chair)?: A Little Help needed to  walk in hospital room?: A Little Help needed climbing 3-5 steps with a railing? : A Little 6 Click Score: 20    End of Session Equipment Utilized During Treatment: Gait belt Activity Tolerance: Patient tolerated treatment well Patient left: in bed;with call bell/phone within reach;with bed alarm set   PT Visit Diagnosis: Muscle weakness (generalized) (M62.81);Difficulty in walking, not elsewhere classified (R26.2)     Time: 1610-9604 PT Time Calculation (min) (ACUTE ONLY): 16 min  Charges:    $Gait Training: 8-22 mins PT General Charges $$ ACUTE PT VISIT: 1 Visit                     Teriah Muela, PT  Acute Rehab Dept ([facility]) [phone]  10/22/2023    [facility] 10/22/2023, 2:14 PM

## 2023-10-22 NOTE — Progress Notes (Signed)
 PHARMACY - ANTICOAGULATION CONSULT NOTE  Pharmacy Consult for heparin Indication: history of PE, holding apixaban  Allergies  Allergen Reactions   Robaxin [Methocarbamol] Itching   Sulfa Antibiotics Itching    ALL SULFA DRUGS    Patient Measurements: Height: 5\' 2"  (157.5 cm) Weight: 68.2 kg (150 lb 5.7 oz) IBW/kg (Calculated) : 50.1 HEPARIN DW (KG): 64.3  Vital Signs: Temp: 98.7 F (37.1 C) (04/16 1212) BP: 95/52 (04/16 1212) Pulse Rate: 84 (04/16 1212)  Labs: Recent Labs    10/20/23 0512 10/22/23 0523  HGB  --  7.6*  HCT  --  22.7*  PLT  --  119*  CREATININE 1.41* 1.22*    Estimated Creatinine Clearance: 38.3 mL/min (A) (by C-G formula based on SCr of 1.22 mg/dL (H)).   Medical History: Past Medical History:  Diagnosis Date   Anxiety attack    Arthritis    Back pain, chronic    Chronic kidney disease    Crohn's colitis (HCC)    Depression (emotion)    Dysrhythmia    GERD (gastroesophageal reflux disease)    Hypertension    Hypothyroid    Iron deficiency anemia, unspecified    Nausea and vomiting 10/22/2023   Neuropathy    Sleep apnea    cpap is not working per patient - needs to find a new doctor - not used since 7/16 per pat    Stage 3 chronic kidney disease (HCC)    Vertigo    followed by Dr Kievie- in [place]     Medications: Apixaban 5 mg PO BID for history of PE -Last dose: 4/16 @ 09:33   Assessment: Pt is a 59 yoF with PMH significant for PE diagnosed in Jan 2025 for which she is prescribed apixaban. PMH also significant for IDA, Chron's disease.  Pt to undergo EGD, pharmacy consulted to transition anticoagulation to heparin drip.   Today, 10/22/23 CBC: Hgb 7.6, Plt 119 - both low Last dose of apixaban this morning @ 09:33.  Heparin level expected to be falsely elevated due to DOAC  Goal of Therapy:  Heparin level 0.3-0.7 units/ml aPTT 66-102 seconds Monitor platelets by anticoagulation protocol: Yes   Plan:  No bolus since currently  anticoagulated with apixaban. Start heparin drip 12 hours after last dose of apixaban. Initiate heparin infusion at 1000 units/hr Instructions to STOP heparin drip @ 06:00 tomorrow morning prior to EGD.  Monitoring of aPTT/heparin level would be 8 hours after initiation of infusion, but since infusion will be stopping at 8 hours - no levels ordered.  CBC with AM labs tomorrow Follow for post-EGD anticoagulation plan  Shireen Dory, PharmD 10/22/2023,6:29 PM

## 2023-10-22 NOTE — Progress Notes (Addendum)
 PROGRESS NOTE    Jessica [name]  VQQ:[id number] DOB: May 08, 1951 DOA: 10/18/2023 PCP: Ignatius Specking, MD  Brief Narrative:  73 years old female with PMH significant for Crohn's disease with prior right hemicolectomy, immunocompromised, leukopenia, PE on anticoagulation, cirrhosis by imaging, hypertension, CKD stage IIIb, hypothyroidism, iron deficiency anemia, mood disorder presented in the ED with persistent nausea and vomiting and diarrhea.  Patient reports after having endoscopy on 4/1 . She began to develop nonproductive cough.  She was prescribed a course of Augmentin by GI for possible aspiration associated with the procedure.  She could not finish the antibiotics because she has developed nausea and vomiting.  She came to the ED on 4/9 and was discharged, she was also prescribed a course of doxycycline but is unclear if she has completed it.  She was found to be septic on clinical and lab work.  So far the cause has been unknown.  Patient is admitted and started on empiric antibiotics.   chest xray nad Mri lumbar spine nad  Assessment & Plan:   Principal Problem:   Fever   Sepsis , due to unknown etiology: Suspect viral GI /respiratory infection. Patient presented with 2 weeks history of URI /LRI symptoms followed by GI symptoms. She is immunocompromised and currently on Rinvoq for Crohn's disease. In ED She was febrile, tachypneic, tachycardic, hypotensive, leukopenic with WBC 3.2.  Lactic acid 0.8.  UA unremarkable.  LFTs normal, CT abdomen and pelvis no acute abnormalities found.   Suspect viral source but due to immunocompromised status, pt was started on empiric broad spectrum IV antibiotics (Vancomycin, Cefepime, Flagyl).  , Stool for C. difficile negative.  Full GI panel also negative. Completed sepsis IV fluids. Pt has completed 3 days Vanc/Cefepime/Flagyl --Blood cultures negative  Nausea with vomiting?  (Chronic diarrhea )possible side effects from all the antibiotics she has  taken for the last 10 days to 2 weeks including doxycycline and Augmentin  Chart review noted with EGD was attempted at [facility] however stomach was covered with food they were unable to do any biopsy or see the stomach I will place her on PPI and consult GI here Continue treatments with Zofran   Generalized weakness --PT evaluation    History of Crohn's disease, prior right hemicolectomy, immunosuppressed:  Holding home Rinvoq.  Continue home Bentyl as needed.  Outpatient f/u with GI.   Leukopenia/pancytopenia-  Suspect related to immunosuppressive agent, follow-up blood counts outpatient.   Hypomagnesemia: resolved   History of PE:  -Holding apixaban   Cirrhosis by imaging:  --Follow-up GI outpatient.   Hypertension: currently with soft BP's, stable MAP's --Continue holding home amlodipine, spironolactone  --Resume regimen gradually as BP allows -hold metoprolol    CKD stage IIIb: Stable. Baseline creatinine 1.3-1.4, Cr was 1.6 on admission. --Monitor BMP --Renally dose meds and avoid nephrotoxins   Hypothyroidism:  --Continue  levothyroxine.   IDA:  --Hold home iron given GI upset.   Mood disorder:  --Continue home bupropion   RLS:  --Continue ropinirole, reduced to 3 times daily.   Chronic pain:  --Continue home hydrocodone-Tylenol TID prn.   OSA: Not currently using CPAP    Estimated body mass index is 27.5 kg/m as calculated from the following:   Height as of this encounter: 5\' 2"  (1.575 m).   Weight as of this encounter: 68.2 kg.  DVT prophylaxis:  Eliquis  code Status: Full code Family Communication: None at bedside Disposition Plan:  Status is: Inpatient Remains inpatient appropriate because:  Acute illness   Consultants:  GI  Procedures: None Antimicrobials: None  Subjective: Patient is very anxious says nobody knows what is going on with her continues to be nauseous unable to keep food down has chronic diarrhea has Crohn's disease has  been on multiple antibiotics recently starting with Augmentin, doxycycline, and Vanco cefepime and Flagyl in the hospital  Objective: Vitals:   10/22/23 0616 10/22/23 0937 10/22/23 1102 10/22/23 1212  BP: (!) 105/55 100/61 100/61 (!) 95/52  Pulse: 90 97  84  Resp: 18 16  18   Temp: 98.3 F (36.8 C)   98.7 F (37.1 C)  TempSrc:      SpO2: 97% 95%  98%  Weight:      Height:       No intake or output data in the 24 hours ending 10/22/23 1430 Filed Weights   10/19/23 0300  Weight: 68.2 kg    Examination:  General exam: Appears calm and comfortable  Respiratory system: Clear to auscultation. Respiratory effort normal. Cardiovascular system: S1 & S2 heard, RRR. No JVD, murmurs, rubs, gallops or clicks. No pedal edema. Gastrointestinal system: Abdomen is nondistended, soft and nontender. No organomegaly or masses felt. Normal bowel sounds heard. Central nervous system: Alert and oriented. No focal neurological deficits. Extremities: Symmetric 5 x 5 power. Skin: No rashes, lesions or ulcers Psychiatry: Judgement and insight appear normal. Mood & affect appropriate.     Data Reviewed: I have personally reviewed following labs and imaging studies  CBC: Recent Labs  Lab 10/15/23 1828 10/18/23 1430 10/19/23 0751 10/22/23 0523  WBC 3.7* 3.2* 3.4* 2.2*  NEUTROABS 2.1 2.4  --   --   HGB 8.7* 8.5* 8.1* 7.6*  HCT 24.7* 23.6* 23.7* 22.7*  MCV 93.6 92.5 96.7 98.7  PLT 185 176 183 119*   Basic Metabolic Panel: Recent Labs  Lab 10/15/23 1828 10/18/23 1430 10/19/23 0751 10/20/23 0512 10/22/23 0523  NA 131* 134* 135 133* 134*  K 4.0 4.3 3.7 4.2 3.6  CL 102 105 108 107 110  CO2 19* 21* 17* 18* 17*  GLUCOSE 85 83 88 86 79  BUN 13 15 11 11 9   CREATININE 1.47* 1.67* 1.38* 1.41* 1.22*  CALCIUM 9.0 9.4 9.0 8.6* 8.4*  MG  --   --  1.5* 2.3  --   PHOS  --   --  2.8 3.7  --    GFR: Estimated Creatinine Clearance: 38.3 mL/min (A) (by C-G formula based on SCr of 1.22 mg/dL  (H)). Liver Function Tests: Recent Labs  Lab 10/15/23 1828 10/18/23 1430  AST 40 30  ALT 29 19  ALKPHOS 54 49  BILITOT 1.1 0.9  PROT 6.7 6.4*  ALBUMIN 4.0 4.1   No results for input(s): "LIPASE", "AMYLASE" in the last 168 hours. No results for input(s): "AMMONIA" in the last 168 hours. Coagulation Profile: Recent Labs  Lab 10/18/23 1430  INR 1.4*   Cardiac Enzymes: No results for input(s): "CKTOTAL", "CKMB", "CKMBINDEX", "TROPONINI" in the last 168 hours. BNP (last 3 results) No results for input(s): "PROBNP" in the last 8760 hours. HbA1C: No results for input(s): "HGBA1C" in the last 72 hours. CBG: No results for input(s): "GLUCAP" in the last 168 hours. Lipid Profile: No results for input(s): "CHOL", "HDL", "LDLCALC", "TRIG", "CHOLHDL", "LDLDIRECT" in the last 72 hours. Thyroid Function Tests: No results for input(s): "TSH", "T4TOTAL", "FREET4", "T3FREE", "THYROIDAB" in the last 72 hours. Anemia Panel: No results for input(s): "VITAMINB12", "FOLATE", "FERRITIN", "TIBC", "IRON", "RETICCTPCT" in the  last 72 hours. Sepsis Labs: Recent Labs  Lab 10/15/23 1828 10/15/23 2022 10/18/23 1430  LATICACIDVEN 1.3 0.9 0.8    Recent Results (from the past 240 hours)  Resp panel by RT-PCR (RSV, Flu A&B, Covid) Anterior Nasal Swab     Status: None   Collection Time: 10/15/23  5:00 PM   Specimen: Anterior Nasal Swab  Result Value Ref Range Status   SARS Coronavirus 2 by RT PCR NEGATIVE NEGATIVE Final    Comment: (NOTE) SARS-CoV-2 target nucleic acids are NOT DETECTED.  The SARS-CoV-2 RNA is generally detectable in upper respiratory specimens during the acute phase of infection. The lowest concentration of SARS-CoV-2 viral copies this assay can detect is 138 copies/mL. A negative result does not preclude SARS-Cov-2 infection and should not be used as the sole basis for treatment or other patient management decisions. A negative result may occur with  improper specimen  collection/handling, submission of specimen other than nasopharyngeal swab, presence of viral mutation(s) within the areas targeted by this assay, and inadequate number of viral copies(<138 copies/mL). A negative result must be combined with clinical observations, patient history, and epidemiological information. The expected result is Negative.  Fact Sheet for Patients:  BloggerCourse.com  Fact Sheet for Healthcare Providers:  SeriousBroker.it  This test is no t yet approved or cleared by the [place] FDA and  has been authorized for detection and/or diagnosis of SARS-CoV-2 by FDA under an Emergency Use Authorization (EUA). This EUA will remain  in effect (meaning this test can be used) for the duration of the COVID-19 declaration under Section 564(b)(1) of the Act, 21 U.S.C.section 360bbb-3(b)(1), unless the authorization is terminated  or revoked sooner.       Influenza A by PCR NEGATIVE NEGATIVE Final   Influenza B by PCR NEGATIVE NEGATIVE Final    Comment: (NOTE) The Xpert Xpress SARS-CoV-2/FLU/RSV plus assay is intended as an aid in the diagnosis of influenza from Nasopharyngeal swab specimens and should not be used as a sole basis for treatment. Nasal washings and aspirates are unacceptable for Xpert Xpress SARS-CoV-2/FLU/RSV testing.  Fact Sheet for Patients: BloggerCourse.com  Fact Sheet for Healthcare Providers: SeriousBroker.it  This test is not yet approved or cleared by the [place] FDA and has been authorized for detection and/or diagnosis of SARS-CoV-2 by FDA under an Emergency Use Authorization (EUA). This EUA will remain in effect (meaning this test can be used) for the duration of the COVID-19 declaration under Section 564(b)(1) of the Act, 21 U.S.C. section 360bbb-3(b)(1), unless the authorization is terminated or revoked.     Resp Syncytial  Virus by PCR NEGATIVE NEGATIVE Final    Comment: (NOTE) Fact Sheet for Patients: BloggerCourse.com  Fact Sheet for Healthcare Providers: SeriousBroker.it  This test is not yet approved or cleared by the [place] FDA and has been authorized for detection and/or diagnosis of SARS-CoV-2 by FDA under an Emergency Use Authorization (EUA). This EUA will remain in effect (meaning this test can be used) for the duration of the COVID-19 declaration under Section 564(b)(1) of the Act, 21 U.S.C. section 360bbb-3(b)(1), unless the authorization is terminated or revoked.  Performed at [facility], [address] [place], [place] [address]   Blood Culture (routine x 2)     Status: None (Preliminary result)   Collection Time: 10/18/23  2:31 PM   Specimen: BLOOD  Result Value Ref Range Status   Specimen Description   Final    BLOOD RIGHT ANTECUBITAL Performed at [facility],  [address] [place], [place] [address]    Special Requests   Final    BOTTLES DRAWN AEROBIC AND ANAEROBIC Blood Culture adequate volume Performed at [facility], [address], [place], [place] [address]    Culture   Final    NO GROWTH 4 DAYS Performed at [facility], [address], [place], [place] [address]    Report Status PENDING  Incomplete  Blood Culture (routine x 2)     Status: None (Preliminary result)   Collection Time: 10/18/23  2:32 PM   Specimen: BLOOD LEFT ARM  Result Value Ref Range Status   Specimen Description   Final    BLOOD LEFT ARM Performed at [facility], [address], [place], [place] [address]    Special Requests   Final    BOTTLES DRAWN AEROBIC AND ANAEROBIC Blood Culture adequate volume Performed at [facility], [address], [place], [place] [address]    Culture   Final    NO GROWTH 4 DAYS Performed at [facility], [address], [place], [place] [address]    Report Status PENDING  Incomplete  Respiratory (~20 pathogens) panel by PCR     Status: None   Collection Time: 10/19/23  4:57 AM   Specimen: Nasopharyngeal Swab; Respiratory  Result Value Ref Range Status   Adenovirus NOT DETECTED NOT DETECTED Final   Coronavirus 229E NOT DETECTED NOT DETECTED Final    Comment: (NOTE) The Coronavirus on the Respiratory Panel, DOES NOT test for the novel  Coronavirus (2019 nCoV)    Coronavirus HKU1 NOT DETECTED NOT DETECTED Final   Coronavirus NL63 NOT DETECTED NOT DETECTED Final   Coronavirus OC43 NOT DETECTED NOT DETECTED Final   Metapneumovirus NOT DETECTED NOT DETECTED Final   Rhinovirus / Enterovirus NOT DETECTED NOT DETECTED Final   Influenza A NOT DETECTED NOT DETECTED Final   Influenza B NOT DETECTED NOT DETECTED Final   Parainfluenza Virus 1 NOT DETECTED NOT DETECTED Final   Parainfluenza Virus 2 NOT DETECTED NOT DETECTED Final   Parainfluenza Virus 3 NOT DETECTED NOT DETECTED Final   Parainfluenza Virus 4 NOT DETECTED NOT DETECTED Final   Respiratory Syncytial Virus NOT DETECTED NOT DETECTED Final   Bordetella pertussis NOT DETECTED NOT DETECTED Final   Bordetella Parapertussis NOT DETECTED NOT DETECTED Final   Chlamydophila pneumoniae NOT DETECTED NOT DETECTED Final   Mycoplasma pneumoniae NOT DETECTED NOT DETECTED Final    Comment: Performed at [facility], [address], [place], [place] [address]  Gastrointestinal Panel by PCR , Stool     Status: None   Collection Time: 10/19/23 11:55 AM   Specimen: Stool  Result Value Ref Range Status   Campylobacter species NOT DETECTED NOT DETECTED Final   Plesimonas shigelloides NOT DETECTED NOT DETECTED Final   Salmonella species NOT DETECTED NOT DETECTED Final   Yersinia enterocolitica NOT DETECTED NOT DETECTED Final   Vibrio species NOT DETECTED NOT DETECTED Final   Vibrio cholerae NOT DETECTED NOT DETECTED Final   Enteroaggregative E coli (EAEC)  NOT DETECTED NOT DETECTED Final   Enteropathogenic E coli (EPEC) NOT DETECTED NOT DETECTED Final   Enterotoxigenic E coli (ETEC) NOT DETECTED NOT DETECTED Final   Shiga like toxin producing E coli (STEC) NOT DETECTED NOT DETECTED Final   Shigella/Enteroinvasive E coli (EIEC) NOT DETECTED NOT DETECTED Final   Cryptosporidium NOT DETECTED NOT DETECTED Final   Cyclospora cayetanensis NOT DETECTED NOT DETECTED Final  Entamoeba histolytica NOT DETECTED NOT DETECTED Final   Giardia lamblia NOT DETECTED NOT DETECTED Final   Adenovirus F40/41 NOT DETECTED NOT DETECTED Final   Astrovirus NOT DETECTED NOT DETECTED Final   Norovirus GI/GII NOT DETECTED NOT DETECTED Final   Rotavirus A NOT DETECTED NOT DETECTED Final   Sapovirus (I, II, IV, and V) NOT DETECTED NOT DETECTED Final    Comment: Performed at [facility], [address] [place], [place] [address]  C Difficile Quick Screen w PCR reflex     Status: None   Collection Time: 10/19/23 11:55 AM   Specimen: Stool  Result Value Ref Range Status   C Diff antigen NEGATIVE NEGATIVE Final   C Diff toxin NEGATIVE NEGATIVE Final   C Diff interpretation No C. difficile detected.  Final    Comment: Performed at [facility], [address], [place], [place] [address]  MRSA Next Gen by PCR, Nasal     Status: None   Collection Time: 10/21/23  8:32 AM   Specimen: Nasal Mucosa; Nasal Swab  Result Value Ref Range Status   MRSA by PCR Next Gen NOT DETECTED NOT DETECTED Final    Comment: (NOTE) The GeneXpert MRSA Assay (FDA approved for NASAL specimens only), is one component of a comprehensive MRSA colonization surveillance program. It is not intended to diagnose MRSA infection nor to guide or monitor treatment for MRSA infections. Test performance is not FDA approved in patients less than 62 years old. Performed at [facility], [address] [place], [place] [address]   Respiratory (~20 pathogens)  panel by PCR     Status: None   Collection Time: 10/21/23 10:43 AM   Specimen: Nasopharyngeal Swab; Respiratory  Result Value Ref Range Status   Adenovirus NOT DETECTED NOT DETECTED Final   Coronavirus 229E NOT DETECTED NOT DETECTED Final    Comment: (NOTE) The Coronavirus on the Respiratory Panel, DOES NOT test for the novel  Coronavirus (2019 nCoV)    Coronavirus HKU1 NOT DETECTED NOT DETECTED Final   Coronavirus NL63 NOT DETECTED NOT DETECTED Final   Coronavirus OC43 NOT DETECTED NOT DETECTED Final   Metapneumovirus NOT DETECTED NOT DETECTED Final   Rhinovirus / Enterovirus NOT DETECTED NOT DETECTED Final   Influenza A NOT DETECTED NOT DETECTED Final   Influenza B NOT DETECTED NOT DETECTED Final   Parainfluenza Virus 1 NOT DETECTED NOT DETECTED Final   Parainfluenza Virus 2 NOT DETECTED NOT DETECTED Final   Parainfluenza Virus 3 NOT DETECTED NOT DETECTED Final   Parainfluenza Virus 4 NOT DETECTED NOT DETECTED Final   Respiratory Syncytial Virus NOT DETECTED NOT DETECTED Final   Bordetella pertussis NOT DETECTED NOT DETECTED Final   Bordetella Parapertussis NOT DETECTED NOT DETECTED Final   Chlamydophila pneumoniae NOT DETECTED NOT DETECTED Final   Mycoplasma pneumoniae NOT DETECTED NOT DETECTED Final    Comment: Performed at [facility], [address], [place], [place] [address]         Radiology Studies: MR Lumbar Spine W Wo Contrast Result Date: 10/22/2023 CLINICAL DATA:  73 year old female with low back pain and weakness. Fever and diarrhea. EXAM: MRI LUMBAR SPINE WITHOUT AND WITH CONTRAST TECHNIQUE: Multiplanar and multiecho pulse sequences of the lumbar spine were obtained without and with intravenous contrast. CONTRAST:  7mL GADAVIST GADOBUTROL 1 MMOL/ML IV SOLN COMPARISON:  CT Abdomen and Pelvis 10/18/2023. CT Chest, Abdomen, and Pelvis 10/15/2023. FINDINGS: Segmentation: Transitional anatomy demonstrated on the 10/15/2023 CT with partially lumbarized  S1 level and  nearly full size S1-S2 disc space. Correlation with radiographs is recommended prior to any operative intervention. Alignment: Stable lumbar lordosis. No significant spondylolisthesis or scoliosis. Vertebrae: Normal background bone marrow signal. Mostly chronic degenerative endplate marrow signal changes in the lower thoracic spine at T11-T12, at the lumbosacral junction L5-S1. Faint if any degenerative endplate marrow edema at those levels. Intact visible sacrum and SI joints. Partially visible right hip arthroplasty artifact. Conus medullaris and cauda equina: Conus extends to the L1 level. No lower spinal cord or conus signal abnormality. No abnormal intradural enhancement. Normal cauda equina nerve roots. Capacious lumbar spinal canal. Paraspinal and other soft tissues: Questionable pleural effusions at the costophrenic angles. Stable and negative visible abdominal viscera. Negative visualized posterior paraspinal soft tissues. Disc levels: Advanced T11-T12 disc degeneration with vacuum disc there on the recent CT. Disc bulging and endplate spurring. Mild to moderate facet hypertrophy greater on the left. No significant stenosis. T12-L1: Through L3-L4: Normal for age disc spaces. Mild to moderate facet and ligament flavum hypertrophy. No spinal or lateral recess stenosis. L4-L5: Negative disc. Moderate to severe facet and ligament flavum hypertrophy. No significant stenosis. L5-S1: Advanced disc degeneration and disc space loss. Vacuum disc here on the recent CT. Chronic laminectomy changes. Moderate to severe residual facet hypertrophy. No spinal stenosis. Mild to moderate lateral recess stenosis (descending S1 nerve levels) and bilateral L5 neural foraminal stenosis, greater on the left. S1-S2: Partially lumbarized and otherwise negative. IMPRESSION: 1. Transitional anatomy with partially lumbarized S1. Correlation with radiographs is recommended prior to any operative intervention. 2. No acute or inflammatory  abnormality in the lumbar spine. And mild for age spine degeneration at most levels. No lumbar spinal stenosis. Multifactorial mild to moderate bilateral neural foraminal and lateral recess stenosis at L5-S1. Query L5 and/or S1 radiculitis. Electronically Signed   By: Marlise Simpers M.D.   On: 10/22/2023 13:36     Scheduled Meds:  apixaban  5 mg Oral BID   buPROPion  150 mg Oral q morning   dicyclomine  10 mg Oral TID AC   feeding supplement  1 Container Oral TID BM   levothyroxine  100 mcg Oral Daily   metoprolol succinate  12.5 mg Oral Daily   rOPINIRole  0.5 mg Oral TID   rosuvastatin  5 mg Oral Daily   sodium chloride flush  3 mL Intravenous Q12H   Continuous Infusions:  sodium chloride 100 mL/hr at 10/22/23 1244     LOS: 3 days    Time spent: 38 min Barbee Lew, MD  10/22/2023, 2:30 PM

## 2023-10-22 NOTE — Plan of Care (Signed)
  Problem: Education: Goal: Knowledge of General Education information will improve Description: Including pain rating scale, medication(s)/side effects and non-pharmacologic comfort measures Outcome: Progressing   Problem: Clinical Measurements: Goal: Will remain free from infection Outcome: Progressing   Problem: Elimination: Goal: Will not experience complications related to bowel motility Outcome: Progressing   Problem: Safety: Goal: Ability to remain free from injury will improve Outcome: Progressing   Problem: Skin Integrity: Goal: Risk for impaired skin integrity will decrease Outcome: Progressing

## 2023-10-23 ENCOUNTER — Encounter (HOSPITAL_COMMUNITY)
Admission: EM | Disposition: A | Discharge status: Home Health | Payer: Self-pay | Source: Home / Self Care | Attending: Internal Medicine

## 2023-10-23 ENCOUNTER — Encounter (HOSPITAL_COMMUNITY): Payer: Self-pay | Admitting: Internal Medicine

## 2023-10-23 ENCOUNTER — Inpatient Hospital Stay (HOSPITAL_COMMUNITY)

## 2023-10-23 ENCOUNTER — Encounter: Payer: Self-pay | Admitting: Gastroenterology

## 2023-10-23 DIAGNOSIS — K449 Diaphragmatic hernia without obstruction or gangrene: Secondary | ICD-10-CM

## 2023-10-23 DIAGNOSIS — D61818 Other pancytopenia: Secondary | ICD-10-CM

## 2023-10-23 DIAGNOSIS — K2900 Acute gastritis without bleeding: Secondary | ICD-10-CM

## 2023-10-23 DIAGNOSIS — K2100 Gastro-esophageal reflux disease with esophagitis, without bleeding: Secondary | ICD-10-CM

## 2023-10-23 HISTORY — PX: ESOPHAGOGASTRODUODENOSCOPY: SHX5428

## 2023-10-23 LAB — CBC
HCT: 19.9 % — ABNORMAL LOW (ref 36.0–46.0)
Hemoglobin: 6.7 g/dL — CL (ref 12.0–15.0)
MCH: 33.5 pg (ref 26.0–34.0)
MCHC: 33.7 g/dL (ref 30.0–36.0)
MCV: 99.5 fL (ref 80.0–100.0)
Platelets: 100 10*3/uL — ABNORMAL LOW (ref 150–400)
RBC: 2 MIL/uL — ABNORMAL LOW (ref 3.87–5.11)
RDW: 14.1 % (ref 11.5–15.5)
WBC: 1.7 10*3/uL — ABNORMAL LOW (ref 4.0–10.5)
nRBC: 0 % (ref 0.0–0.2)

## 2023-10-23 LAB — COMPREHENSIVE METABOLIC PANEL WITH GFR
ALT: 20 U/L (ref 0–44)
AST: 32 U/L (ref 15–41)
Albumin: 2.8 g/dL — ABNORMAL LOW (ref 3.5–5.0)
Alkaline Phosphatase: 63 U/L (ref 38–126)
Anion gap: 6 (ref 5–15)
BUN: 9 mg/dL (ref 8–23)
CO2: 18 mmol/L — ABNORMAL LOW (ref 22–32)
Calcium: 7.9 mg/dL — ABNORMAL LOW (ref 8.9–10.3)
Chloride: 111 mmol/L (ref 98–111)
Creatinine, Ser: 1.15 mg/dL — ABNORMAL HIGH (ref 0.44–1.00)
GFR, Estimated: 51 mL/min — ABNORMAL LOW (ref >60)
Glucose, Bld: 79 mg/dL (ref 70–99)
Potassium: 3.6 mmol/L (ref 3.5–5.1)
Sodium: 135 mmol/L (ref 135–145)
Total Bilirubin: 1 mg/dL (ref 0.0–1.2)
Total Protein: 4.9 g/dL — ABNORMAL LOW (ref 6.5–8.1)

## 2023-10-23 LAB — HEMOGLOBIN AND HEMATOCRIT, BLOOD
HCT: 23.9 % — ABNORMAL LOW (ref 36.0–46.0)
Hemoglobin: 7.8 g/dL — ABNORMAL LOW (ref 12.0–15.0)

## 2023-10-23 LAB — CULTURE, BLOOD (ROUTINE X 2)
Culture: NO GROWTH
Culture: NO GROWTH
Special Requests: ADEQUATE
Special Requests: ADEQUATE

## 2023-10-23 LAB — C-REACTIVE PROTEIN: CRP: 3.7 mg/dL — ABNORMAL HIGH (ref <1.0)

## 2023-10-23 LAB — TSH: TSH: 0.438 u[IU]/mL (ref 0.350–4.500)

## 2023-10-23 LAB — PREPARE RBC (CROSSMATCH)

## 2023-10-23 LAB — CORTISOL-AM, BLOOD: Cortisol - AM: 9.1 ug/dL (ref 6.7–22.6)

## 2023-10-23 LAB — FERRITIN: Ferritin: 1296 ng/mL — ABNORMAL HIGH (ref 11–307)

## 2023-10-23 SURGERY — EGD (ESOPHAGOGASTRODUODENOSCOPY)
Anesthesia: General

## 2023-10-23 MED ORDER — SODIUM CHLORIDE 0.9 % IV SOLN: INTRAVENOUS | Status: DC

## 2023-10-23 MED ORDER — SODIUM CHLORIDE 0.9% IV SOLUTION: Freq: Once | INTRAVENOUS | Status: DC

## 2023-10-23 MED ORDER — LIDOCAINE 2% (20 MG/ML) 5 ML SYRINGE
INTRAMUSCULAR | Status: DC | PRN
  Administered 2023-10-23: 70 mg via INTRAVENOUS

## 2023-10-23 MED ORDER — HEPARIN (PORCINE) 25000 UT/250ML-% IV SOLN
1000.0000 [IU]/h | INTRAVENOUS | Status: DC
  Administered 2023-10-23: 1000 [IU]/h via INTRAVENOUS
  Filled 2023-10-23: qty 250

## 2023-10-23 MED ORDER — PROPOFOL 500 MG/50ML IV EMUL
INTRAVENOUS | Status: DC | PRN
  Administered 2023-10-23 (×2): 30 mg via INTRAVENOUS
  Administered 2023-10-23: 80 mg via INTRAVENOUS

## 2023-10-23 MED ORDER — PANTOPRAZOLE SODIUM 40 MG IV SOLR
40.0000 mg | Freq: Two times a day (BID) | INTRAVENOUS | Status: DC
  Administered 2023-10-23 – 2023-10-24 (×2): 40 mg via INTRAVENOUS
  Filled 2023-10-23 (×2): qty 10

## 2023-10-23 NOTE — Op Note (Signed)
 [facility] Patient Name: Jessica [name] Procedure Date: 10/23/2023 MRN: [id number] Attending MD: Shirley Friar , MD, [id number] Date of Birth: 1951-06-18 CSN: [id number] Age: 73 Admit Type: Outpatient Procedure:                Upper GI endoscopy Indications:              Nausea with vomiting Providers:                Shirley Friar, MD, Martha Clan, RN,                            Geoffery Lyons, Technician Referring MD:             hospital team Medicines:                Propofol per Anesthesia, Monitored Anesthesia Care Complications:            No immediate complications. Estimated Blood Loss:     Estimated blood loss was minimal. Procedure:                Pre-Anesthesia Assessment:                           - Prior to the procedure, a History and Physical                            was performed, and patient medications and                            allergies were reviewed. The patient's tolerance of                            previous anesthesia was also reviewed. The risks                            and benefits of the procedure and the sedation                            options and risks were discussed with the patient.                            All questions were answered, and informed consent                            was obtained. Prior Anticoagulants: The patient has                            taken heparin which was held at 0600. ASA Grade                            Assessment: III - A patient with severe systemic                            disease. After reviewing the risks and benefits,  the patient was deemed in satisfactory condition to                            undergo the procedure.                           After obtaining informed consent, the endoscope was                            passed under direct vision. Throughout the                            procedure, the patient's blood pressure, pulse, and                             oxygen saturations were monitored continuously. The                            [id number]-H190 ([id number] Olympus endoscope was introduced                            through the mouth, and advanced to the second part                            of duodenum. The upper GI endoscopy was                            accomplished without difficulty. The patient                            tolerated the procedure fairly well. Scope In: Scope Out: Findings:      The Z-line was found 36 cm from the incisors.      Localized mild inflammation characterized by congestion (edema),       erosions and erythema was found in the gastric antrum. Biopsies were       taken with a cold forceps for histology. Estimated blood loss was       minimal.      A medium-sized hiatal hernia was present.      The examined duodenum was normal.      LA Grade A (one or more mucosal breaks less than 5 mm, not extending       between tops of 2 mucosal folds) esophagitis with no bleeding was found       at the gastroesophageal junction. Impression:               - Z-line, 36 cm from the incisors.                           - Acute gastritis. Biopsied.                           - Medium-sized hiatal hernia.                           - Normal examined duodenum.                           -  LA Grade A reflux esophagitis with no bleeding. Moderate Sedation:      N/A - MAC procedure Recommendation:           - Clear liquid diet.                           - Await pathology results. Procedure Code(s):        --- Professional ---                           [phone], Esophagogastroduodenoscopy, flexible,                            transoral; with biopsy, single or multiple Diagnosis Code(s):        --- Professional ---                           R11.2, Nausea with vomiting, unspecified                           K29.00, Acute gastritis without bleeding                           K21.00, Gastro-esophageal reflux disease  with                            esophagitis, without bleeding                           K44.9, Diaphragmatic hernia without obstruction or                            gangrene CPT copyright 2022 [employer]. All rights reserved. The codes documented in this report are preliminary and upon coder review may  be revised to meet current compliance requirements. Jessica Herbert, MD 10/23/2023 2:44:49 PM This report has been signed electronically. Number of Addenda: 0

## 2023-10-23 NOTE — Interval H&P Note (Signed)
 History and Physical Interval Note:  10/23/2023 11:06 AM  Jessica [name]  has presented today for surgery, with the diagnosis of Nausea/Vomiting.  The various methods of treatment have been discussed with the patient and family. After consideration of risks, benefits and other options for treatment, the patient has consented to  Procedure(s): EGD (ESOPHAGOGASTRODUODENOSCOPY) (N/A) as a surgical intervention.  The patient's history has been reviewed, patient examined, no change in status, stable for surgery.  I have reviewed the patient's chart and labs.  Questions were answered to the patient's satisfaction.     Yvetta Herbert

## 2023-10-23 NOTE — Anesthesia Postprocedure Evaluation (Signed)
 Anesthesia Post Note  Patient: Jessica [name]  Procedure(s) Performed: EGD (ESOPHAGOGASTRODUODENOSCOPY)     Patient location during evaluation: PACU Anesthesia Type: MAC Level of consciousness: awake and alert Pain management: pain level controlled Vital Signs Assessment: post-procedure vital signs reviewed and stable Respiratory status: spontaneous breathing, nonlabored ventilation, respiratory function stable and patient connected to nasal cannula oxygen Cardiovascular status: stable and blood pressure returned to baseline Postop Assessment: no apparent nausea or vomiting Anesthetic complications: no   No notable events documented.  Last Vitals:  Vitals:   10/23/23 1449 10/23/23 1459  BP: (!) 93/43 (!) 112/53  Pulse: 87 87  Resp: 14 17  Temp:    SpO2: 98% 95%    Last Pain:  Vitals:   10/23/23 1459  TempSrc:   PainSc: 0-No pain                 Lethaniel Rave

## 2023-10-23 NOTE — Anesthesia Preprocedure Evaluation (Signed)
 Anesthesia Evaluation  Patient identified by MRN, date of birth, ID band Patient awake    Reviewed: Allergy & Precautions, H&P , NPO status , Patient's Chart, lab work & pertinent test results, reviewed documented beta blocker date and time   Airway Mallampati: II  TM Distance: >3 FB Neck ROM: full    Dental  (+) Dental Advisory Given, Missing,    Pulmonary sleep apnea    Pulmonary exam normal breath sounds clear to auscultation       Cardiovascular Exercise Tolerance: Good hypertension, Normal cardiovascular exam+ dysrhythmias  Rhythm:regular Rate:Normal     Neuro/Psych  PSYCHIATRIC DISORDERS Anxiety Depression    Hx of vestibular schwannoma  negative psych ROS   GI/Hepatic Neg liver ROS,GERD  ,,Crohn's   Endo/Other  Hypothyroidism    Renal/GU Renal diseaseStage 3 CKD  negative genitourinary   Musculoskeletal  (+) Arthritis ,    Abdominal   Peds  Hematology  (+) Blood dyscrasia, anemia   Anesthesia Other Findings   Reproductive/Obstetrics negative OB ROS                              Anesthesia Physical Anesthesia Plan  ASA: 3  Anesthesia Plan: General   Post-op Pain Management: Minimal or no pain anticipated   Induction: Intravenous  PONV Risk Score and Plan: 3 and Propofol infusion  Airway Management Planned: Nasal Cannula and Natural Airway  Additional Equipment: None  Intra-op Plan:   Post-operative Plan:   Informed Consent: I have reviewed the patients History and Physical, chart, labs and discussed the procedure including the risks, benefits and alternatives for the proposed anesthesia with the patient or authorized representative who has indicated his/her understanding and acceptance.     Dental Advisory Given  Plan Discussed with: CRNA  Anesthesia Plan Comments:         Anesthesia Quick Evaluation

## 2023-10-23 NOTE — Interval H&P Note (Signed)
 History and Physical Interval Note:  10/23/2023 2:19 PM  Jessica [name]  has presented today for surgery, with the diagnosis of Nausea/Vomiting.  The various methods of treatment have been discussed with the patient and family. After consideration of risks, benefits and other options for treatment, the patient has consented to  Procedure(s): EGD (ESOPHAGOGASTRODUODENOSCOPY) (N/A) as a surgical intervention.  The patient's history has been reviewed, patient examined, no change in status, stable for surgery.  I have reviewed the patient's chart and labs.  Questions were answered to the patient's satisfaction.     Yvetta Herbert

## 2023-10-23 NOTE — Progress Notes (Addendum)
 PROGRESS NOTE    Jessica [name]  BJY:[id number] DOB: 1951/01/06 DOA: 10/18/2023 PCP: Orlena Bitters, MD  Brief Narrative:  73 years old female with PMH significant for Crohn's disease with prior right hemicolectomy, immunocompromised, leukopenia, PE on anticoagulation, cirrhosis by imaging, hypertension, CKD stage IIIb, hypothyroidism, iron deficiency anemia, mood disorder presented in the ED with persistent nausea and vomiting and diarrhea.  Patient reports after having endoscopy on 4/1 . She began to develop nonproductive cough.  She was prescribed a course of Augmentin by GI for possible aspiration associated with the procedure.  She could not finish the antibiotics because she has developed nausea and vomiting.  She came to the ED on 4/9 and was discharged, she was also prescribed a course of doxycycline but is unclear if she has completed it.  She was found to be septic on clinical and lab work.  So far the cause has been unknown.  Patient is admitted and started on empiric antibiotics.   chest xray nad Mri lumbar spine nad  Assessment & Plan:   Principal Problem:   Fever Active Problems:   Nausea and vomiting   Sepsis-ruled out patient was initially admitted with fever tachypnea tachycardia and hypotension with leukopenia however cultures and workup did not reveal any evidence of acute infection.  Workup included unremarkable UA, CT abdomen pelvis no acute abnormalities.  She was initially started on vancomycin cefepime and Flagyl due to immunocompromise status Rinvoq for Crohn's disease.  Stool C. difficile was negative(she has chronic diarrhea) complete GI panel was negative she was treated with sepsis protocol IV fluids.  She completed 3 days of Vanco cefepime and Flagyl then it was stopped. She continues to feel sick nauseated unable to keep anything down patient had EGD done on 10/23/2023 which showed gastritis and esophagitis.  Biopsies pending. She was started on a PPI.  Nausea with  vomiting?  (Chronic diarrhea )possible side effects from all the antibiotics she has taken for the last 10 days to 2 weeks including doxycycline and Augmentin  Chart review noted with EGD was attempted at [facility] however stomach was covered with food they were unable to do any biopsy or see the stomach GI consulted here and patient had EGD done on 10/23/2023.  Continue symptomatic treatments.    History of Crohn's disease, prior right hemicolectomy, immunosuppressed:  Holding home Rinvoq.  Continue home Bentyl as needed.  Outpatient f/u with GI.   Leukopenia/pancytopenia-  Suspect related to immunosuppressive agent, follow-up blood counts outpatient.   Hypomagnesemia: resolved   History of PE:  -Holding apixaban  -On heparin drip  Cirrhosis by imaging:  --Follow-up GI outpatient.   Hypertension: currently with soft BP's, stable MAP's --Continue holding home amlodipine, spironolactone  --Resume regimen gradually as BP allows -hold metoprolol    CKD stage IIIb: Stable. Baseline creatinine 1.3-1.4, Cr was 1.6 on admission. --Monitor BMP --Renally dose meds and avoid nephrotoxins   Hypothyroidism:  --Continue  levothyroxine.   IDA:  --Hold home iron given GI upset.   Mood disorder:  --Continue home bupropion   RLS:  --Continue ropinirole, reduced to 3 times daily.   Chronic pain:  --Continue home hydrocodone-Tylenol TID prn.   OSA: Not currently using CPAP  -Progressive pancytopenia unclear etiology rule out MDS/CMV/EBV/parvo hepatitis panel abdominal ultrasound will follow-up levels in a.m. if still trending down will consult hematology S/p 1 unit prbc 4/17  Estimated body mass index is 27.5 kg/m as calculated from the following:   Height as of  this encounter: 5\' 2"  (1.575 m).   Weight as of this encounter: 68.2 kg.  DVT prophylaxis: Heparin drip code Status: Full code Family Communication: None at bedside Disposition Plan:  Status is: Inpatient Remains  inpatient appropriate because: Acute illness   Consultants:  GI  Procedures: None Antimicrobials: None  Subjective: She continues to feel sick  Objective: Vitals:   10/23/23 1401 10/23/23 1439 10/23/23 1449 10/23/23 1459  BP: (!) 112/52 (!) 81/40 (!) 93/43 (!) 112/53  Pulse: 82 88 87 87  Resp: (!) 22 13 14 17   Temp:  98.2 F (36.8 C)    TempSrc:  Tympanic    SpO2: 98% 92% 98% 95%  Weight:      Height:        Intake/Output Summary (Last 24 hours) at 10/23/2023 1515 Last data filed at 10/23/2023 1441 Gross per 24 hour  Intake 576.48 ml  Output --  Net 576.48 ml   Filed Weights   10/19/23 0300  Weight: 68.2 kg    Examination:  General exam: Appears calm and comfortable  Respiratory system: Clear to auscultation. Respiratory effort normal. Cardiovascular system: S1 & S2 heard, RRR. No JVD, murmurs, rubs, gallops or clicks. No pedal edema. Gastrointestinal system: Abdomen is nondistended, soft and nontender. No organomegaly or masses felt. Normal bowel sounds heard. Central nervous system: Alert and oriented. No focal neurological deficits. Extremities: Symmetric 5 x 5 power. Skin: No rashes, lesions or ulcers Psychiatry: Judgement and insight appear normal. Mood & affect appropriate.     Data Reviewed: I have personally reviewed following labs and imaging studies  CBC: Recent Labs  Lab 10/18/23 1430 10/19/23 0751 10/22/23 0523 10/23/23 0849  WBC 3.2* 3.4* 2.2* 1.7*  NEUTROABS 2.4  --   --   --   HGB 8.5* 8.1* 7.6* 6.7*  HCT 23.6* 23.7* 22.7* 19.9*  MCV 92.5 96.7 98.7 99.5  PLT 176 183 119* 100*   Basic Metabolic Panel: Recent Labs  Lab 10/18/23 1430 10/19/23 0751 10/20/23 0512 10/22/23 0523 10/23/23 0548  NA 134* 135 133* 134* 135  K 4.3 3.7 4.2 3.6 3.6  CL 105 108 107 110 111  CO2 21* 17* 18* 17* 18*  GLUCOSE 83 88 86 79 79  BUN 15 11 11 9 9   CREATININE 1.67* 1.38* 1.41* 1.22* 1.15*  CALCIUM 9.4 9.0 8.6* 8.4* 7.9*  MG  --  1.5* 2.3  --   --    PHOS  --  2.8 3.7  --   --    GFR: Estimated Creatinine Clearance: 40.6 mL/min (A) (by C-G formula based on SCr of 1.15 mg/dL (H)). Liver Function Tests: Recent Labs  Lab 10/18/23 1430 10/23/23 0548  AST 30 32  ALT 19 20  ALKPHOS 49 63  BILITOT 0.9 1.0  PROT 6.4* 4.9*  ALBUMIN 4.1 2.8*   No results for input(s): "LIPASE", "AMYLASE" in the last 168 hours. No results for input(s): "AMMONIA" in the last 168 hours. Coagulation Profile: Recent Labs  Lab 10/18/23 1430  INR 1.4*   Cardiac Enzymes: No results for input(s): "CKTOTAL", "CKMB", "CKMBINDEX", "TROPONINI" in the last 168 hours. BNP (last 3 results) No results for input(s): "PROBNP" in the last 8760 hours. HbA1C: No results for input(s): "HGBA1C" in the last 72 hours. CBG: No results for input(s): "GLUCAP" in the last 168 hours. Lipid Profile: No results for input(s): "CHOL", "HDL", "LDLCALC", "TRIG", "CHOLHDL", "LDLDIRECT" in the last 72 hours. Thyroid Function Tests: Recent Labs    10/23/23 0548  TSH 0.438   Anemia Panel: Recent Labs    10/23/23 0548  FERRITIN 1,296*   Sepsis Labs: Recent Labs  Lab 10/18/23 1430  LATICACIDVEN 0.8    Recent Results (from the past 240 hours)  Resp panel by RT-PCR (RSV, Flu A&B, Covid) Anterior Nasal Swab     Status: None   Collection Time: 10/15/23  5:00 PM   Specimen: Anterior Nasal Swab  Result Value Ref Range Status   SARS Coronavirus 2 by RT PCR NEGATIVE NEGATIVE Final    Comment: (NOTE) SARS-CoV-2 target nucleic acids are NOT DETECTED.  The SARS-CoV-2 RNA is generally detectable in upper respiratory specimens during the acute phase of infection. The lowest concentration of SARS-CoV-2 viral copies this assay can detect is 138 copies/mL. A negative result does not preclude SARS-Cov-2 infection and should not be used as the sole basis for treatment or other patient management decisions. A negative result may occur with  improper specimen collection/handling,  submission of specimen other than nasopharyngeal swab, presence of viral mutation(s) within the areas targeted by this assay, and inadequate number of viral copies(<138 copies/mL). A negative result must be combined with clinical observations, patient history, and epidemiological information. The expected result is Negative.  Fact Sheet for Patients:  BloggerCourse.com  Fact Sheet for Healthcare Providers:  SeriousBroker.it  This test is no t yet approved or cleared by the [place]  FDA and  has been authorized for detection and/or diagnosis of SARS-CoV-2 by FDA under an Emergency Use Authorization (EUA). This EUA will remain  in effect (meaning this test can be used) for the duration of the COVID-19 declaration under Section 564(b)(1) of the Act, 21 U.S.C.section 360bbb-3(b)(1), unless the authorization is terminated  or revoked sooner.       Influenza A by PCR NEGATIVE NEGATIVE Final   Influenza B by PCR NEGATIVE NEGATIVE Final    Comment: (NOTE) The Xpert Xpress SARS-CoV-2/FLU/RSV plus assay is intended as an aid in the diagnosis of influenza from Nasopharyngeal swab specimens and should not be used as a sole basis for treatment. Nasal washings and aspirates are unacceptable for Xpert Xpress SARS-CoV-2/FLU/RSV testing.  Fact Sheet for Patients: BloggerCourse.com  Fact Sheet for Healthcare Providers: SeriousBroker.it  This test is not yet approved or cleared by the [place]  FDA and has been authorized for detection and/or diagnosis of SARS-CoV-2 by FDA under an Emergency Use Authorization (EUA). This EUA will remain in effect (meaning this test can be used) for the duration of the COVID-19 declaration under Section 564(b)(1) of the Act, 21 U.S.C. section 360bbb-3(b)(1), unless the authorization is terminated or revoked.     Resp Syncytial Virus by PCR NEGATIVE  NEGATIVE Final    Comment: (NOTE) Fact Sheet for Patients: BloggerCourse.com  Fact Sheet for Healthcare Providers: SeriousBroker.it  This test is not yet approved or cleared by the [place]  FDA and has been authorized for detection and/or diagnosis of SARS-CoV-2 by FDA under an Emergency Use Authorization (EUA). This EUA will remain in effect (meaning this test can be used) for the duration of the COVID-19 declaration under Section 564(b)(1) of the Act, 21 U.S.C. section 360bbb-3(b)(1), unless the authorization is terminated or revoked.  Performed at [facility], [address] [place], [place] [address]   Blood Culture (routine x 2)     Status: None   Collection Time: 10/18/23  2:31 PM   Specimen: BLOOD  Result Value Ref Range Status   Specimen Description   Final    BLOOD RIGHT  ANTECUBITAL Performed at [facility], [address], [place], [place] [address]    Special Requests   Final    BOTTLES DRAWN AEROBIC AND ANAEROBIC Blood Culture adequate volume Performed at [facility], [address], [place], [place] [address]    Culture   Final    NO GROWTH 5 DAYS Performed at [facility], [address] [place], [place] [address]    Report Status 10/23/2023 FINAL  Final  Blood Culture (routine x 2)     Status: None   Collection Time: 10/18/23  2:32 PM   Specimen: BLOOD LEFT ARM  Result Value Ref Range Status   Specimen Description   Final    BLOOD LEFT ARM Performed at [facility], [address], [place], [place] [address]    Special Requests   Final    BOTTLES DRAWN AEROBIC AND ANAEROBIC Blood Culture adequate volume Performed at [facility], [address], [place], [place] [address]    Culture   Final    NO GROWTH 5 DAYS Performed at [facility], [address], [place], [place] [address]    Report Status  10/23/2023 FINAL  Final  Respiratory (~20 pathogens) panel by PCR     Status: None   Collection Time: 10/19/23  4:57 AM   Specimen: Nasopharyngeal Swab; Respiratory  Result Value Ref Range Status   Adenovirus NOT DETECTED NOT DETECTED Final   Coronavirus 229E NOT DETECTED NOT DETECTED Final    Comment: (NOTE) The Coronavirus on the Respiratory Panel, DOES NOT test for the novel  Coronavirus (2019 nCoV)    Coronavirus HKU1 NOT DETECTED NOT DETECTED Final   Coronavirus NL63 NOT DETECTED NOT DETECTED Final   Coronavirus OC43 NOT DETECTED NOT DETECTED Final   Metapneumovirus NOT DETECTED NOT DETECTED Final   Rhinovirus / Enterovirus NOT DETECTED NOT DETECTED Final   Influenza A NOT DETECTED NOT DETECTED Final   Influenza B NOT DETECTED NOT DETECTED Final   Parainfluenza Virus 1 NOT DETECTED NOT DETECTED Final   Parainfluenza Virus 2 NOT DETECTED NOT DETECTED Final   Parainfluenza Virus 3 NOT DETECTED NOT DETECTED Final   Parainfluenza Virus 4 NOT DETECTED NOT DETECTED Final   Respiratory Syncytial Virus NOT DETECTED NOT DETECTED Final   Bordetella pertussis NOT DETECTED NOT DETECTED Final   Bordetella Parapertussis NOT DETECTED NOT DETECTED Final   Chlamydophila pneumoniae NOT DETECTED NOT DETECTED Final   Mycoplasma pneumoniae NOT DETECTED NOT DETECTED Final    Comment: Performed at [facility], [address], [place], [place] [address]  Gastrointestinal Panel by PCR , Stool     Status: None   Collection Time: 10/19/23 11:55 AM   Specimen: Stool  Result Value Ref Range Status   Campylobacter species NOT DETECTED NOT DETECTED Final   Plesimonas shigelloides NOT DETECTED NOT DETECTED Final   Salmonella species NOT DETECTED NOT DETECTED Final   Yersinia enterocolitica NOT DETECTED NOT DETECTED Final   Vibrio species NOT DETECTED NOT DETECTED Final   Vibrio cholerae NOT DETECTED NOT DETECTED Final   Enteroaggregative E coli (EAEC) NOT DETECTED NOT DETECTED Final    Enteropathogenic E coli (EPEC) NOT DETECTED NOT DETECTED Final   Enterotoxigenic E coli (ETEC) NOT DETECTED NOT DETECTED Final   Shiga like toxin producing E coli (STEC) NOT DETECTED NOT DETECTED Final   Shigella/Enteroinvasive E coli (EIEC) NOT DETECTED NOT DETECTED Final   Cryptosporidium NOT DETECTED NOT DETECTED Final   Cyclospora cayetanensis  NOT DETECTED NOT DETECTED Final   Entamoeba histolytica NOT DETECTED NOT DETECTED Final   Giardia lamblia NOT DETECTED NOT DETECTED Final   Adenovirus F40/41 NOT DETECTED NOT DETECTED Final   Astrovirus NOT DETECTED NOT DETECTED Final   Norovirus GI/GII NOT DETECTED NOT DETECTED Final   Rotavirus A NOT DETECTED NOT DETECTED Final   Sapovirus (I, II, IV, and V) NOT DETECTED NOT DETECTED Final    Comment: Performed at [facility], [address] [place], [place] [address]  C Difficile Quick Screen w PCR reflex     Status: None   Collection Time: 10/19/23 11:55 AM   Specimen: Stool  Result Value Ref Range Status   C Diff antigen NEGATIVE NEGATIVE Final   C Diff toxin NEGATIVE NEGATIVE Final   C Diff interpretation No C. difficile detected.  Final    Comment: Performed at [facility], [address], [place], [place] [address]  MRSA Next Gen by PCR, Nasal     Status: None   Collection Time: 10/21/23  8:32 AM   Specimen: Nasal Mucosa; Nasal Swab  Result Value Ref Range Status   MRSA by PCR Next Gen NOT DETECTED NOT DETECTED Final    Comment: (NOTE) The GeneXpert MRSA Assay (FDA approved for NASAL specimens only), is one component of a comprehensive MRSA colonization surveillance program. It is not intended to diagnose MRSA infection nor to guide or monitor treatment for MRSA infections. Test performance is not FDA approved in patients less than 62 years old. Performed at [facility], [address], [place], [place] [address]   Respiratory (~20 pathogens) panel by PCR     Status: None    Collection Time: 10/21/23 10:43 AM   Specimen: Nasopharyngeal Swab; Respiratory  Result Value Ref Range Status   Adenovirus NOT DETECTED NOT DETECTED Final   Coronavirus 229E NOT DETECTED NOT DETECTED Final    Comment: (NOTE) The Coronavirus on the Respiratory Panel, DOES NOT test for the novel  Coronavirus (2019 nCoV)    Coronavirus HKU1 NOT DETECTED NOT DETECTED Final   Coronavirus NL63 NOT DETECTED NOT DETECTED Final   Coronavirus OC43 NOT DETECTED NOT DETECTED Final   Metapneumovirus NOT DETECTED NOT DETECTED Final   Rhinovirus / Enterovirus NOT DETECTED NOT DETECTED Final   Influenza A NOT DETECTED NOT DETECTED Final   Influenza B NOT DETECTED NOT DETECTED Final   Parainfluenza Virus 1 NOT DETECTED NOT DETECTED Final   Parainfluenza Virus 2 NOT DETECTED NOT DETECTED Final   Parainfluenza Virus 3 NOT DETECTED NOT DETECTED Final   Parainfluenza Virus 4 NOT DETECTED NOT DETECTED Final   Respiratory Syncytial Virus NOT DETECTED NOT DETECTED Final   Bordetella pertussis NOT DETECTED NOT DETECTED Final   Bordetella Parapertussis NOT DETECTED NOT DETECTED Final   Chlamydophila pneumoniae NOT DETECTED NOT DETECTED Final   Mycoplasma pneumoniae NOT DETECTED NOT DETECTED Final    Comment: Performed at [facility], [address], [place], [place] [address]         Radiology Studies: MR Lumbar Spine W Wo Contrast Result Date: 10/22/2023 CLINICAL DATA:  73 year old female with low back pain and weakness. Fever and diarrhea. EXAM: MRI LUMBAR SPINE WITHOUT AND WITH CONTRAST TECHNIQUE: Multiplanar and multiecho pulse sequences of the lumbar spine were obtained without and with intravenous contrast. CONTRAST:  7mL GADAVIST GADOBUTROL 1 MMOL/ML IV SOLN COMPARISON:  CT Abdomen and Pelvis 10/18/2023. CT Chest, Abdomen, and Pelvis 10/15/2023. FINDINGS: Segmentation: Transitional anatomy demonstrated  on the 10/15/2023 CT with partially lumbarized S1 level and nearly full size S1-S2 disc  space. Correlation with radiographs is recommended prior to any operative intervention. Alignment: Stable lumbar lordosis. No significant spondylolisthesis or scoliosis. Vertebrae: Normal background bone marrow signal. Mostly chronic degenerative endplate marrow signal changes in the lower thoracic spine at T11-T12, at the lumbosacral junction L5-S1. Faint if any degenerative endplate marrow edema at those levels. Intact visible sacrum and SI joints. Partially visible right hip arthroplasty artifact. Conus medullaris and cauda equina: Conus extends to the L1 level. No lower spinal cord or conus signal abnormality. No abnormal intradural enhancement. Normal cauda equina nerve roots. Capacious lumbar spinal canal. Paraspinal and other soft tissues: Questionable pleural effusions at the costophrenic angles. Stable and negative visible abdominal viscera. Negative visualized posterior paraspinal soft tissues. Disc levels: Advanced T11-T12 disc degeneration with vacuum disc there on the recent CT. Disc bulging and endplate spurring. Mild to moderate facet hypertrophy greater on the left. No significant stenosis. T12-L1: Through L3-L4: Normal for age disc spaces. Mild to moderate facet and ligament flavum hypertrophy. No spinal or lateral recess stenosis. L4-L5: Negative disc. Moderate to severe facet and ligament flavum hypertrophy. No significant stenosis. L5-S1: Advanced disc degeneration and disc space loss. Vacuum disc here on the recent CT. Chronic laminectomy changes. Moderate to severe residual facet hypertrophy. No spinal stenosis. Mild to moderate lateral recess stenosis (descending S1 nerve levels) and bilateral L5 neural foraminal stenosis, greater on the left. S1-S2: Partially lumbarized and otherwise negative. IMPRESSION: 1. Transitional anatomy with partially lumbarized S1. Correlation with radiographs is recommended prior to any operative intervention. 2. No acute or inflammatory abnormality in the lumbar  spine. And mild for age spine degeneration at most levels. No lumbar spinal stenosis. Multifactorial mild to moderate bilateral neural foraminal and lateral recess stenosis at L5-S1. Query L5 and/or S1 radiculitis. Electronically Signed   By: Marlise Simpers M.D.   On: 10/22/2023 13:36     Scheduled Meds:  sodium chloride   Intravenous Once   buPROPion  150 mg Oral q morning   dicyclomine  10 mg Oral TID AC   feeding supplement  1 Container Oral TID BM   levothyroxine  100 mcg Oral Daily   pantoprazole (PROTONIX) IV  40 mg Intravenous Q24H   rOPINIRole  0.5 mg Oral TID   rosuvastatin  5 mg Oral Daily   sodium chloride flush  3 mL Intravenous Q12H   Continuous Infusions:  sodium chloride 100 mL/hr at 10/23/23 1417     LOS: 4 days    Time spent: 38 min Barbee Lew, MD  10/23/2023, 3:15 PM

## 2023-10-23 NOTE — Transfer of Care (Signed)
 Immediate Anesthesia Transfer of Care Note  Patient: Jessica [name]  Procedure(s) Performed: EGD (ESOPHAGOGASTRODUODENOSCOPY)  Patient Location: PACU  Anesthesia Type:MAC  Level of Consciousness: awake  Airway & Oxygen Therapy: Patient Spontanous Breathing and Patient connected to nasal cannula oxygen  Post-op Assessment: Report given to RN and Post -op Vital signs reviewed and stable  Post vital signs: Reviewed and stable  Last Vitals:  Vitals Value Taken Time  BP 81/40 10/23/23 1440  Temp 36.8 C 10/23/23 1439  Pulse 90 10/23/23 1442  Resp 40 10/23/23 1442  SpO2 92 % 10/23/23 1442  Vitals shown include unfiled device data.  Last Pain:  Vitals:   10/23/23 1439  TempSrc: Tympanic  PainSc: Asleep      Patients Stated Pain Goal: 2 (10/23/23 0603)  Complications: No notable events documented.

## 2023-10-23 NOTE — H&P (View-Only) (Signed)
 Referring Provider: Dr. Jerolyn Center Primary Care Physician:  Ignatius Specking, MD Primary Gastroenterologist:  Gentry Fitz (Dr. Karilyn Cota in [place])  Reason for Consultation:  Nausea/Vomiting; Crohn's disease  HPI: Jessica [name] is a 73 y.o. female with long history of Crohn's disease who is followed by Dr. Karilyn Cota in [place] and was on Rinvoq prior to admit (husband reports she received 2 infusions of Entyvio was was changed due to lack of response). S/P EGD on 4/1 that was incomplete due to food in stomach and was given antibiotics following that procedure for possible aspiration. Developed intractable nausea/vomiting on the antibiotics. Chronic diarrhea that has worsened on antibiotics with more than 3-4 diarrhea stools per day. Denies rectal bleeding or melena. On Eliquis for PE. Nodular liver on CT on 10/15/23.   Past Medical History:  Diagnosis Date   Anxiety attack    Arthritis    Back pain, chronic    Chronic kidney disease    Crohn's colitis (HCC)    Depression (emotion)    Dysrhythmia    GERD (gastroesophageal reflux disease)    Hypertension    Hypothyroid    Iron deficiency anemia, unspecified    Nausea and vomiting 10/22/2023   Neuropathy    Sleep apnea    cpap is not working per patient - needs to find a new doctor - not used since 7/16 per pat    Stage 3 chronic kidney disease (HCC)    Vertigo    followed by Dr Angelena Sole- in [place]     Past Surgical History:  Procedure Laterality Date   ABDOMINAL HYSTERECTOMY     BIOPSY  06/08/2018   Procedure: BIOPSY;  Surgeon: Malissa Hippo, MD;  [facility];  Service: Endoscopy;;  (colon)   BIOPSY  04/01/2023   Procedure: BIOPSY;  Surgeon: Dolores Frame, MD;  [facility];  Service: Gastroenterology;;   CHOLECYSTECTOMY     COLON SURGERY     for Crohn's in the 17s in [place]   COLONOSCOPY N/A 03/11/2013   Procedure: COLONOSCOPY;  Surgeon: Malissa Hippo, MD;  [facility];  Service:  Endoscopy;  Laterality: N/A;  1200   COLONOSCOPY N/A 06/08/2018   Procedure: COLONOSCOPY;  Surgeon: Malissa Hippo, MD;  [facility];  Service: Endoscopy;  Laterality: N/A;  8:25   COLONOSCOPY WITH PROPOFOL N/A 04/01/2023   Procedure: COLONOSCOPY WITH PROPOFOL;  Surgeon: Dolores Frame, MD;  [facility];  Service: Gastroenterology;  Laterality: N/A;   ESOPHAGEAL BRUSHING  04/01/2023   Procedure: ESOPHAGEAL BRUSHING;  Surgeon: Dolores Frame, MD;  [facility];  Service: Gastroenterology;;   ESOPHAGOGASTRODUODENOSCOPY N/A 10/07/2023   Procedure: EGD (ESOPHAGOGASTRODUODENOSCOPY);  Surgeon: Marguerita Merles, Reuel Boom, MD;  [facility];  Service: Gastroenterology;  Laterality: N/A;  215[PM, ASA 3, pt knows to arrive at 6:15   ESOPHAGOGASTRODUODENOSCOPY (EGD) WITH PROPOFOL N/A 04/01/2023   Procedure: ESOPHAGOGASTRODUODENOSCOPY (EGD) WITH PROPOFOL;  Surgeon: Dolores Frame, MD;  [facility];  Service: Gastroenterology;  Laterality: N/A;  1:00am;asa 3   fatty tumor removed     HARDWARE REMOVAL Right 08/07/2016   Procedure: HARDWARE REMOVAL;  Surgeon: Ollen Gross, MD;  [facility]: [facility];  Service: Orthopedics;  Laterality: Right;   Hip replacement      Rt hip in 2010 in [place]   KNEE ARTHROSCOPY Left 03/29/2015   Procedure: ARTHROSCOPY LEFT KNEE WITH MENSICAL DEBRIDEMENT, chondroplasty;  Surgeon: Ollen Gross, MD;  [facility]: [facility];  Service: Orthopedics;  Laterality: Left;   ORIF TIBIA PLATEAU Right 02/29/2016   Procedure: OPEN REDUCTION INTERNAL FIXATION (ORIF RIGHT  TIBIAL PLATEAU FRACTURE;  Surgeon: Liliane Rei, MD;  [facility];  Service: Orthopedics;  Laterality: Right;   POLYPECTOMY  04/01/2023   Procedure: POLYPECTOMY;  Surgeon: Urban Garden, MD;  [facility];  Service: Gastroenterology;;   TEE WITHOUT CARDIOVERSION N/A 03/27/2021   Procedure: TRANSESOPHAGEAL ECHOCARDIOGRAM  (TEE);  Surgeon: Laurann Pollock, MD;  [facility];  Service: Endoscopy;  Laterality: N/A;   TOTAL KNEE ARTHROPLASTY Right 08/25/2017   Procedure: TOTAL RIGHT KNEE ARTHROPLASTY;  Surgeon: Liliane Rei, MD;  [facility];  Service: Orthopedics;  Laterality: Right;    Prior to Admission medications   Medication Sig Start Date End Date Taking? Authorizing Provider  acetaminophen (TYLENOL) 325 MG tablet Take 2 tablets (650 mg total) by mouth every 6 (six) hours as needed for mild pain (pain score 1-3), fever or headache (or Fever >/= 101). 07/16/23  Yes Emokpae, Courage, MD  Albuterol-Budesonide (AIRSUPRA) 90-80 MCG/ACT AERO Inhale 2 puffs into the lungs every 8 (eight) hours. Patient taking differently: Inhale 2 puffs into the lungs daily as needed (SOB). 07/22/23  Yes Lind Repine, MD  amLODipine (NORVASC) 10 MG tablet Take 1 tablet by mouth once daily 10/06/23  Yes Branch, Joyceann No, MD  apixaban (ELIQUIS) 5 MG TABS tablet Take 1 tablet (5 mg total) by mouth 2 (two) times daily. 08/13/23  Yes Emokpae, Courage, MD  Azelastine HCl 137 MCG/SPRAY SOLN Place 2 sprays into both nostrils 2 (two) times daily as needed (Congestion/ Allergies). 07/17/23  Yes [provider]  buPROPion (WELLBUTRIN XL) 150 MG 24 hr tablet TAKE 1 TABLET BY MOUTH ONCE DAILY IN THE MORNING 09/30/23  Yes Alysia Bachelor, MD  dicyclomine (BENTYL) 10 MG capsule TAKE 1 CAPSULE BY MOUTH TWICE DAILY BEFORE A MEAL Patient taking differently: Take 10 mg by mouth 2 (two) times daily with a meal. 10/01/23  Yes Mahon, Martine Sleek, NP  doxycycline (VIBRAMYCIN) 100 MG capsule Take 1 capsule (100 mg total) by mouth 2 (two) times daily. Patient taking differently: Take 100 mg by mouth 2 (two) times daily. Until 4/17 10/16/23  Yes Sueellen Emery, MD  ferrous sulfate 325 (65 FE) MG EC tablet Take 1 tablet (325 mg total) by mouth daily with breakfast. 07/16/23  Yes Emokpae, Courage, MD  HYDROcodone-acetaminophen (NORCO/VICODIN)  5-325 MG tablet Take 1 tablet by mouth every 6 (six) hours as needed for up to 20 doses for moderate pain (pain score 4-6). Patient taking differently: Take 1 tablet by mouth every 6 (six) hours as needed for moderate pain (pain score 4-6) or severe pain (pain score 7-10). 06/26/23  Yes Semon, Jake L, DPM  levothyroxine (SYNTHROID) 100 MCG tablet Take 100 mcg by mouth daily. 01/09/22  Yes [provider]  metoprolol succinate (TOPROL-XL) 25 MG 24 hr tablet Take 1.5 tablets (37.5 mg total) by mouth daily. TAKE 1 & 1/2 (ONE & ONE-HALF) TABLETS BY MOUTH ONCE DAILY Patient taking differently: Take 25 mg by mouth daily. 07/16/23  Yes Colin Dawley, MD  Multiple Vitamin (MULTIVITAMIN WITH MINERALS) TABS tablet Take 1 tablet by mouth daily.   Yes [provider]  omeprazole (PRILOSEC) 40 MG capsule Take 1 capsule (40 mg total) by mouth daily. 10/07/23  Yes Urban Garden, MD  ondansetron (ZOFRAN-ODT) 4 MG disintegrating tablet Take 1 tablet (4 mg total) by mouth every 8 (eight)  hours as needed. Patient taking differently: Take 4 mg by mouth every 8 (eight) hours as needed for vomiting, nausea or refractory nausea / vomiting. 10/16/23  Yes Haviland, Julie, MD  rOPINIRole (REQUIP) 1 MG tablet Take 3 mg by mouth at bedtime. 03/18/23  Yes [provider]  rosuvastatin (CRESTOR) 10 MG tablet Take 1 tablet (10 mg total) by mouth daily. 01/10/23  Yes Lasalle Pointer, NP  sodium bicarbonate 650 MG tablet Take 650 mg by mouth 2 (two) times daily.   Yes [provider]  spironolactone (ALDACTONE) 50 MG tablet Take 50 mg by mouth daily. 03/27/22  Yes [provider]  Upadacitinib ER (RINVOQ) 45 MG TB24 Take 1 tablet (45 mg total) by mouth daily. Take for 12 weeks then reduce to 15 mg once daily. 08/19/23 11/11/23 Yes Mahon, Martine Sleek, NP  calcitRIOL (ROCALTROL) 0.25 MCG capsule Take 0.25 mcg by mouth daily. Patient not taking: Reported on 10/19/2023    [provider]  rOPINIRole (REQUIP) 0.5 MG tablet Take 0.5 mg by mouth 5 (five) times daily. Patient not taking: Reported on 10/19/2023 10/12/23   [provider]    Scheduled Meds:  buPROPion  150 mg Oral q morning   dicyclomine  10 mg Oral TID AC   feeding supplement  1 Container Oral TID BM   levothyroxine  100 mcg Oral Daily   pantoprazole (PROTONIX) IV  40 mg Intravenous Q24H   rOPINIRole  0.5 mg Oral TID   rosuvastatin  5 mg Oral Daily   sodium chloride flush  3 mL Intravenous Q12H   Continuous Infusions:  sodium chloride 100 mL/hr at 10/22/23 1244   sodium chloride     PRN Meds:.acetaminophen, albuterol, ALPRAZolam, alum & mag hydroxide-simeth, benzonatate, HYDROcodone-acetaminophen, melatonin, ondansetron (ZOFRAN) IV  Allergies as of 10/18/2023 - Review Complete 10/18/2023  Allergen Reaction Noted   Morphine and codeine Hives and Itching 02/10/2013   Robaxin [methocarbamol] Itching 07/29/2016   Sulfa antibiotics Itching 07/29/2016    Family History  Problem Relation Age of Onset   Crohn's disease Brother    Depression Mother    Anxiety disorder Mother    Depression Sister    Aneurysm Father    Colon cancer Neg Hx     Social History   Socioeconomic History   Marital status: Married    Spouse name: Not on file   Number of children: Not on file   Years of education: Not on file   Highest education level: Not on file  Occupational History   Not on file  Tobacco Use   Smoking status: Never   Smokeless tobacco: Never  Vaping Use   Vaping status: Never Used  Substance and Sexual Activity   Alcohol use: No    Alcohol/week: 0.0 standard drinks of alcohol   Drug use: No   Sexual activity: Not Currently  Other Topics Concern   Not on file  Social History Narrative   Not on file   Social Drivers of Health   Financial Resource Strain: Not on file  Food Insecurity: No Food Insecurity (10/19/2023)   Hunger Vital Sign    Worried About Running Out of Food in the  Last Year: Never true    Ran Out of Food in the Last Year: Never true  Transportation Needs: No Transportation Needs (10/19/2023)   PRAPARE - Administrator, Civil Service (Medical): No    Lack of Transportation (Non-Medical): No  Physical Activity: Not on file  Stress:  Not on file  Social Connections: Socially Integrated (07/14/2023)   Social Connection and Isolation Panel [NHANES]    Frequency of Communication with Friends and Family: More than three times a week    Frequency of Social Gatherings with Friends and Family: More than three times a week    Attends Religious Services: More than 4 times per year    Active Member of [employer] or Organizations: Not on file    Attends Banker Meetings: More than 4 times per year    Marital Status: Married  Catering manager Violence: Not At Risk (10/19/2023)   Humiliation, Afraid, Rape, and Kick questionnaire    Fear of Current or Ex-Partner: No    Emotionally Abused: No    Physically Abused: No    Sexually Abused: No    Review of Systems: All negative except as stated above in HPI.  Physical Exam: Vital signs: Vitals:   10/22/23 2042 10/23/23 0603  BP: 120/68 115/60  Pulse: (!) 102 98  Resp: 18 (!) 8  Temp: 99 F (37.2 C) 99.8 F (37.7 C)  SpO2: 99% 98%   Last BM Date : 10/22/23 General:  Lethargic, chronically ill-appearing, thin, no acute distress   Head: normocephalic, atraumatic Eyes: anicteric sclera ENT: oropharynx clear Neck: supple, nontender Lungs:  Clear throughout to auscultation.   No wheezes, crackles, or rhonchi. No acute distress. Heart:  Regular rate and rhythm; no murmurs, clicks, rubs,  or gallops. Abdomen: soft, nontender, nondistended, +BS  Rectal:  Deferred Ext: no edema  GI:  Lab Results: Recent Labs    10/22/23 0523  WBC 2.2*  HGB 7.6*  HCT 22.7*  PLT 119*   BMET Recent Labs    10/22/23 0523 10/23/23 0548  NA 134* 135  K 3.6 3.6  CL 110 111  CO2 17* 18*  GLUCOSE 79  79  BUN 9 9  CREATININE 1.22* 1.15*  CALCIUM 8.4* 7.9*   LFT Recent Labs    10/23/23 0548  PROT 4.9*  ALBUMIN 2.8*  AST 32  ALT 20  ALKPHOS 63  BILITOT 1.0   PT/INR No results for input(s): "LABPROT", "INR" in the last 72 hours.   Studies/Results:   Impression/Plan: Intractable nausea/vomiting and diarrhea in the setting of recent antibiotics. History of Crohn's disease on Rinvoq. Eliquis on hold. EGD today to further evaluate. Needs close f/u with Dr. Homero Luster as outpt. Supportive care.    LOS: 4 days   Yvetta Herbert  10/23/2023, 10:38 AM  Questions please call [phone]

## 2023-10-23 NOTE — Consult Note (Signed)
 Referring Provider: Dr. Jerolyn Center Primary Care Physician:  Ignatius Specking, MD Primary Gastroenterologist:  Gentry Fitz (Dr. Karilyn Cota in [place])  Reason for Consultation:  Nausea/Vomiting; Crohn's disease  HPI: Jessica [name] is a 73 y.o. female with long history of Crohn's disease who is followed by Dr. Karilyn Cota in [place] and was on Rinvoq prior to admit (husband reports she received 2 infusions of Entyvio was was changed due to lack of response). S/P EGD on 4/1 that was incomplete due to food in stomach and was given antibiotics following that procedure for possible aspiration. Developed intractable nausea/vomiting on the antibiotics. Chronic diarrhea that has worsened on antibiotics with more than 3-4 diarrhea stools per day. Denies rectal bleeding or melena. On Eliquis for PE. Nodular liver on CT on 10/15/23.   Past Medical History:  Diagnosis Date   Anxiety attack    Arthritis    Back pain, chronic    Chronic kidney disease    Crohn's colitis (HCC)    Depression (emotion)    Dysrhythmia    GERD (gastroesophageal reflux disease)    Hypertension    Hypothyroid    Iron deficiency anemia, unspecified    Nausea and vomiting 10/22/2023   Neuropathy    Sleep apnea    cpap is not working per patient - needs to find a new doctor - not used since 7/16 per pat    Stage 3 chronic kidney disease (HCC)    Vertigo    followed by Dr Angelena Sole- in [place]     Past Surgical History:  Procedure Laterality Date   ABDOMINAL HYSTERECTOMY     BIOPSY  06/08/2018   Procedure: BIOPSY;  Surgeon: Malissa Hippo, MD;  [facility];  Service: Endoscopy;;  (colon)   BIOPSY  04/01/2023   Procedure: BIOPSY;  Surgeon: Dolores Frame, MD;  [facility];  Service: Gastroenterology;;   CHOLECYSTECTOMY     COLON SURGERY     for Crohn's in the 17s in [place]   COLONOSCOPY N/A 03/11/2013   Procedure: COLONOSCOPY;  Surgeon: Malissa Hippo, MD;  [facility];  Service:  Endoscopy;  Laterality: N/A;  1200   COLONOSCOPY N/A 06/08/2018   Procedure: COLONOSCOPY;  Surgeon: Malissa Hippo, MD;  [facility];  Service: Endoscopy;  Laterality: N/A;  8:25   COLONOSCOPY WITH PROPOFOL N/A 04/01/2023   Procedure: COLONOSCOPY WITH PROPOFOL;  Surgeon: Dolores Frame, MD;  [facility];  Service: Gastroenterology;  Laterality: N/A;   ESOPHAGEAL BRUSHING  04/01/2023   Procedure: ESOPHAGEAL BRUSHING;  Surgeon: Dolores Frame, MD;  [facility];  Service: Gastroenterology;;   ESOPHAGOGASTRODUODENOSCOPY N/A 10/07/2023   Procedure: EGD (ESOPHAGOGASTRODUODENOSCOPY);  Surgeon: Marguerita Merles, Reuel Boom, MD;  [facility];  Service: Gastroenterology;  Laterality: N/A;  215[PM, ASA 3, pt knows to arrive at 6:15   ESOPHAGOGASTRODUODENOSCOPY (EGD) WITH PROPOFOL N/A 04/01/2023   Procedure: ESOPHAGOGASTRODUODENOSCOPY (EGD) WITH PROPOFOL;  Surgeon: Dolores Frame, MD;  [facility];  Service: Gastroenterology;  Laterality: N/A;  1:00am;asa 3   fatty tumor removed     HARDWARE REMOVAL Right 08/07/2016   Procedure: HARDWARE REMOVAL;  Surgeon: Ollen Gross, MD;  [facility];  Service: Orthopedics;  Laterality: Right;   Hip replacement      Rt hip in 2010 in [place]   KNEE ARTHROSCOPY Left 03/29/2015   Procedure: ARTHROSCOPY LEFT KNEE WITH MENSICAL DEBRIDEMENT, chondroplasty;  Surgeon: Ollen Gross, MD;  [facility];  Service: Orthopedics;  Laterality: Left;   ORIF TIBIA PLATEAU Right 02/29/2016   Procedure: OPEN REDUCTION INTERNAL FIXATION (ORIF RIGHT  TIBIAL PLATEAU FRACTURE;  Surgeon: Liliane Rei, MD;  [facility];  Service: Orthopedics;  Laterality: Right;   POLYPECTOMY  04/01/2023   Procedure: POLYPECTOMY;  Surgeon: Urban Garden, MD;  [facility];  Service: Gastroenterology;;   TEE WITHOUT CARDIOVERSION N/A 03/27/2021   Procedure: TRANSESOPHAGEAL ECHOCARDIOGRAM  (TEE);  Surgeon: Laurann Pollock, MD;  [facility];  Service: Endoscopy;  Laterality: N/A;   TOTAL KNEE ARTHROPLASTY Right 08/25/2017   Procedure: TOTAL RIGHT KNEE ARTHROPLASTY;  Surgeon: Liliane Rei, MD;  [facility];  Service: Orthopedics;  Laterality: Right;    Prior to Admission medications   Medication Sig Start Date End Date Taking? Authorizing Provider  acetaminophen (TYLENOL) 325 MG tablet Take 2 tablets (650 mg total) by mouth every 6 (six) hours as needed for mild pain (pain score 1-3), fever or headache (or Fever >/= 101). 07/16/23  Yes Emokpae, Courage, MD  Albuterol-Budesonide (AIRSUPRA) 90-80 MCG/ACT AERO Inhale 2 puffs into the lungs every 8 (eight) hours. Patient taking differently: Inhale 2 puffs into the lungs daily as needed (SOB). 07/22/23  Yes Lind Repine, MD  amLODipine (NORVASC) 10 MG tablet Take 1 tablet by mouth once daily 10/06/23  Yes Branch, Joyceann No, MD  apixaban (ELIQUIS) 5 MG TABS tablet Take 1 tablet (5 mg total) by mouth 2 (two) times daily. 08/13/23  Yes Emokpae, Courage, MD  Azelastine HCl 137 MCG/SPRAY SOLN Place 2 sprays into both nostrils 2 (two) times daily as needed (Congestion/ Allergies). 07/17/23  Yes [provider]  buPROPion (WELLBUTRIN XL) 150 MG 24 hr tablet TAKE 1 TABLET BY MOUTH ONCE DAILY IN THE MORNING 09/30/23  Yes Alysia Bachelor, MD  dicyclomine (BENTYL) 10 MG capsule TAKE 1 CAPSULE BY MOUTH TWICE DAILY BEFORE A MEAL Patient taking differently: Take 10 mg by mouth 2 (two) times daily with a meal. 10/01/23  Yes Mahon, Martine Sleek, NP  doxycycline (VIBRAMYCIN) 100 MG capsule Take 1 capsule (100 mg total) by mouth 2 (two) times daily. Patient taking differently: Take 100 mg by mouth 2 (two) times daily. Until 4/17 10/16/23  Yes Sueellen Emery, MD  ferrous sulfate 325 (65 FE) MG EC tablet Take 1 tablet (325 mg total) by mouth daily with breakfast. 07/16/23  Yes Emokpae, Courage, MD  HYDROcodone-acetaminophen (NORCO/VICODIN)  5-325 MG tablet Take 1 tablet by mouth every 6 (six) hours as needed for up to 20 doses for moderate pain (pain score 4-6). Patient taking differently: Take 1 tablet by mouth every 6 (six) hours as needed for moderate pain (pain score 4-6) or severe pain (pain score 7-10). 06/26/23  Yes Semon, Jake L, DPM  levothyroxine (SYNTHROID) 100 MCG tablet Take 100 mcg by mouth daily. 01/09/22  Yes [provider]  metoprolol succinate (TOPROL-XL) 25 MG 24 hr tablet Take 1.5 tablets (37.5 mg total) by mouth daily. TAKE 1 & 1/2 (ONE & ONE-HALF) TABLETS BY MOUTH ONCE DAILY Patient taking differently: Take 25 mg by mouth daily. 07/16/23  Yes Colin Dawley, MD  Multiple Vitamin (MULTIVITAMIN WITH MINERALS) TABS tablet Take 1 tablet by mouth daily.   Yes [provider]  omeprazole (PRILOSEC) 40 MG capsule Take 1 capsule (40 mg total) by mouth daily. 10/07/23  Yes Urban Garden, MD  ondansetron (ZOFRAN-ODT) 4 MG disintegrating tablet Take 1 tablet (4 mg total) by mouth every 8 (eight)  hours as needed. Patient taking differently: Take 4 mg by mouth every 8 (eight) hours as needed for vomiting, nausea or refractory nausea / vomiting. 10/16/23  Yes Haviland, Julie, MD  rOPINIRole (REQUIP) 1 MG tablet Take 3 mg by mouth at bedtime. 03/18/23  Yes [provider]  rosuvastatin (CRESTOR) 10 MG tablet Take 1 tablet (10 mg total) by mouth daily. 01/10/23  Yes Lasalle Pointer, NP  sodium bicarbonate 650 MG tablet Take 650 mg by mouth 2 (two) times daily.   Yes [provider]  spironolactone (ALDACTONE) 50 MG tablet Take 50 mg by mouth daily. 03/27/22  Yes [provider]  Upadacitinib ER (RINVOQ) 45 MG TB24 Take 1 tablet (45 mg total) by mouth daily. Take for 12 weeks then reduce to 15 mg once daily. 08/19/23 11/11/23 Yes Mahon, Martine Sleek, NP  calcitRIOL (ROCALTROL) 0.25 MCG capsule Take 0.25 mcg by mouth daily. Patient not taking: Reported on 10/19/2023    [provider]  rOPINIRole (REQUIP) 0.5 MG tablet Take 0.5 mg by mouth 5 (five) times daily. Patient not taking: Reported on 10/19/2023 10/12/23   [provider]    Scheduled Meds:  buPROPion  150 mg Oral q morning   dicyclomine  10 mg Oral TID AC   feeding supplement  1 Container Oral TID BM   levothyroxine  100 mcg Oral Daily   pantoprazole (PROTONIX) IV  40 mg Intravenous Q24H   rOPINIRole  0.5 mg Oral TID   rosuvastatin  5 mg Oral Daily   sodium chloride flush  3 mL Intravenous Q12H   Continuous Infusions:  sodium chloride 100 mL/hr at 10/22/23 1244   sodium chloride     PRN Meds:.acetaminophen, albuterol, ALPRAZolam, alum & mag hydroxide-simeth, benzonatate, HYDROcodone-acetaminophen, melatonin, ondansetron (ZOFRAN) IV  Allergies as of 10/18/2023 - Review Complete 10/18/2023  Allergen Reaction Noted   Morphine and codeine Hives and Itching 02/10/2013   Robaxin [methocarbamol] Itching 07/29/2016   Sulfa antibiotics Itching 07/29/2016    Family History  Problem Relation Age of Onset   Crohn's disease Brother    Depression Mother    Anxiety disorder Mother    Depression Sister    Aneurysm Father    Colon cancer Neg Hx     Social History   Socioeconomic History   Marital status: Married    Spouse name: Not on file   Number of children: Not on file   Years of education: Not on file   Highest education level: Not on file  Occupational History   Not on file  Tobacco Use   Smoking status: Never   Smokeless tobacco: Never  Vaping Use   Vaping status: Never Used  Substance and Sexual Activity   Alcohol use: No    Alcohol/week: 0.0 standard drinks of alcohol   Drug use: No   Sexual activity: Not Currently  Other Topics Concern   Not on file  Social History Narrative   Not on file   Social Drivers of Health   Financial Resource Strain: Not on file  Food Insecurity: No Food Insecurity (10/19/2023)   Hunger Vital Sign    Worried About Running Out of Food in the  Last Year: Never true    Ran Out of Food in the Last Year: Never true  Transportation Needs: No Transportation Needs (10/19/2023)   PRAPARE - Administrator, Civil Service (Medical): No    Lack of Transportation (Non-Medical): No  Physical Activity: Not on file  Stress:  Not on file  Social Connections: Socially Integrated (07/14/2023)   Social Connection and Isolation Panel [NHANES]    Frequency of Communication with Friends and Family: More than three times a week    Frequency of Social Gatherings with Friends and Family: More than three times a week    Attends Religious Services: More than 4 times per year    Active Member of [employer] or Organizations: Not on file    Attends Banker Meetings: More than 4 times per year    Marital Status: Married  Catering manager Violence: Not At Risk (10/19/2023)   Humiliation, Afraid, Rape, and Kick questionnaire    Fear of Current or Ex-Partner: No    Emotionally Abused: No    Physically Abused: No    Sexually Abused: No    Review of Systems: All negative except as stated above in HPI.  Physical Exam: Vital signs: Vitals:   10/22/23 2042 10/23/23 0603  BP: 120/68 115/60  Pulse: (!) 102 98  Resp: 18 (!) 8  Temp: 99 F (37.2 C) 99.8 F (37.7 C)  SpO2: 99% 98%   Last BM Date : 10/22/23 General:  Lethargic, chronically ill-appearing, thin, no acute distress   Head: normocephalic, atraumatic Eyes: anicteric sclera ENT: oropharynx clear Neck: supple, nontender Lungs:  Clear throughout to auscultation.   No wheezes, crackles, or rhonchi. No acute distress. Heart:  Regular rate and rhythm; no murmurs, clicks, rubs,  or gallops. Abdomen: soft, nontender, nondistended, +BS  Rectal:  Deferred Ext: no edema  GI:  Lab Results: Recent Labs    10/22/23 0523  WBC 2.2*  HGB 7.6*  HCT 22.7*  PLT 119*   BMET Recent Labs    10/22/23 0523 10/23/23 0548  NA 134* 135  K 3.6 3.6  CL 110 111  CO2 17* 18*  GLUCOSE 79  79  BUN 9 9  CREATININE 1.22* 1.15*  CALCIUM 8.4* 7.9*   LFT Recent Labs    10/23/23 0548  PROT 4.9*  ALBUMIN 2.8*  AST 32  ALT 20  ALKPHOS 63  BILITOT 1.0   PT/INR No results for input(s): "LABPROT", "INR" in the last 72 hours.   Studies/Results:   Impression/Plan: Intractable nausea/vomiting and diarrhea in the setting of recent antibiotics. History of Crohn's disease on Rinvoq. Eliquis on hold. EGD today to further evaluate. Needs close f/u with Dr. Karilyn Cota as outpt. Supportive care.    LOS: 4 days   Shirley Friar  10/23/2023, 10:38 AM  Questions please call [phone]

## 2023-10-23 NOTE — Progress Notes (Signed)
 PHARMACY - ANTICOAGULATION CONSULT NOTE  Pharmacy Consult for heparin Indication: history of PE, holding apixaban  Allergies  Allergen Reactions   Robaxin [Methocarbamol] Itching   Sulfa Antibiotics Itching    ALL SULFA DRUGS    Patient Measurements: Height: 5\' 2"  (157.5 cm) Weight: 68.2 kg (150 lb 5.7 oz) IBW/kg (Calculated) : 50.1 HEPARIN DW (KG): 64.3  Vital Signs: Temp: 98.2 F (36.8 C) (04/17 1439) Temp Source: Tympanic (04/17 1439) BP: 112/53 (04/17 1459) Pulse Rate: 87 (04/17 1459)  Labs: Recent Labs    10/22/23 0523 10/23/23 0548 10/23/23 0849  HGB 7.6*  --  6.7*  HCT 22.7*  --  19.9*  PLT 119*  --  100*  CREATININE 1.22* 1.15*  --     Estimated Creatinine Clearance: 40.6 mL/min (A) (by C-G formula based on SCr of 1.15 mg/dL (H)).   Medical History: Past Medical History:  Diagnosis Date   Anxiety attack    Arthritis    Back pain, chronic    Chronic kidney disease    Crohn's colitis (HCC)    Depression (emotion)    Dysrhythmia    GERD (gastroesophageal reflux disease)    Hypertension    Hypothyroid    Iron deficiency anemia, unspecified    Nausea and vomiting 10/22/2023   Neuropathy    Sleep apnea    cpap is not working per patient - needs to find a new doctor - not used since 7/16 per pat    Stage 3 chronic kidney disease (HCC)    Vertigo    followed by Dr Kievie- in [place]     Medications: Apixaban 5 mg PO BID for history of PE -Last dose: 4/16 @ 09:33   Assessment: Pt is a 68 yoF with PMH significant for PE diagnosed in Jan 2025 for which she is prescribed apixaban. PMH also significant for IDA, Chron's disease.  Pharmacy consulted to transition anticoagulation to heparin drip on 4/16 in anticipation of EGD.  Last dose of apixaban given 4/16 @ 09:33  Today, 10/23/23 CBC: Hgb 6.7, Plt 100 - both low and decreased Anticipate heparin level to be falsely elevated due to DOAC  Heparin stopped at 06:00 this morning prior to EGD. Discussed  with GI Dr. Honey Lusty this afternoon post-EGD > verbal order to resume heparin drip in 2 hours.  Goal of Therapy:  Heparin level 0.3-0.7 units/ml aPTT 66-102 seconds Monitor platelets by anticoagulation protocol: Yes   Plan:  Resume heparin at previous rate of 1000 units/hr at 17:30 this evening Check 8 hour heparin level/aPTT CBC, HL/aPTT daily Once heparin level and aPTT correlate, can monitor using heparin level only Monitor for signs of bleeding  Shireen Dory, PharmD 10/23/2023,3:40 PM

## 2023-10-23 NOTE — Plan of Care (Signed)

## 2023-10-24 ENCOUNTER — Encounter (HOSPITAL_COMMUNITY): Payer: Self-pay | Admitting: Gastroenterology

## 2023-10-24 DIAGNOSIS — R509 Fever, unspecified: Secondary | ICD-10-CM | POA: Diagnosis not present

## 2023-10-24 LAB — COMPREHENSIVE METABOLIC PANEL WITH GFR
ALT: 20 U/L (ref 0–44)
AST: 34 U/L (ref 15–41)
Albumin: 2.7 g/dL — ABNORMAL LOW (ref 3.5–5.0)
Alkaline Phosphatase: 73 U/L (ref 38–126)
Anion gap: 6 (ref 5–15)
BUN: 8 mg/dL (ref 8–23)
CO2: 17 mmol/L — ABNORMAL LOW (ref 22–32)
Calcium: 7.6 mg/dL — ABNORMAL LOW (ref 8.9–10.3)
Chloride: 110 mmol/L (ref 98–111)
Creatinine, Ser: 1.23 mg/dL — ABNORMAL HIGH (ref 0.44–1.00)
GFR, Estimated: 47 mL/min — ABNORMAL LOW (ref >60)
Glucose, Bld: 87 mg/dL (ref 70–99)
Potassium: 3.4 mmol/L — ABNORMAL LOW (ref 3.5–5.1)
Sodium: 133 mmol/L — ABNORMAL LOW (ref 135–145)
Total Bilirubin: 1.1 mg/dL (ref 0.0–1.2)
Total Protein: 4.7 g/dL — ABNORMAL LOW (ref 6.5–8.1)

## 2023-10-24 LAB — BPAM RBC
Blood Product Expiration Date: 202505162359
ISSUE DATE / TIME: 202504171306
Unit Type and Rh: 5100

## 2023-10-24 LAB — TYPE AND SCREEN
ABO/RH(D): O POS
Antibody Screen: NEGATIVE
Unit division: 0

## 2023-10-24 LAB — CMV IGM: CMV IgM: <30 [AU]/ml (ref 0.0–29.9)

## 2023-10-24 LAB — CBC
HCT: 23.1 % — ABNORMAL LOW (ref 36.0–46.0)
Hemoglobin: 7.9 g/dL — ABNORMAL LOW (ref 12.0–15.0)
MCH: 32.5 pg (ref 26.0–34.0)
MCHC: 34.2 g/dL (ref 30.0–36.0)
MCV: 95.1 fL (ref 80.0–100.0)
Platelets: 106 10*3/uL — ABNORMAL LOW (ref 150–400)
RBC: 2.43 MIL/uL — ABNORMAL LOW (ref 3.87–5.11)
RDW: 14.7 % (ref 11.5–15.5)
WBC: 2.5 10*3/uL — ABNORMAL LOW (ref 4.0–10.5)
nRBC: 0 % (ref 0.0–0.2)

## 2023-10-24 LAB — APTT
aPTT: 152 s — ABNORMAL HIGH (ref 24–36)
aPTT: 65 s — ABNORMAL HIGH (ref 24–36)
aPTT: 115 s — ABNORMAL HIGH (ref 24–36)

## 2023-10-24 LAB — HEPATITIS PANEL, ACUTE
HCV Ab: NONREACTIVE
Hep A IgM: NONREACTIVE
Hep B C IgM: NONREACTIVE
Hepatitis B Surface Ag: NONREACTIVE

## 2023-10-24 LAB — HEPARIN LEVEL (UNFRACTIONATED): Heparin Unfractionated: >1.1 [IU]/mL — ABNORMAL HIGH (ref 0.30–0.70)

## 2023-10-24 LAB — SURGICAL PATHOLOGY

## 2023-10-24 MED ORDER — HEPARIN (PORCINE) 25000 UT/250ML-% IV SOLN
800.0000 [IU]/h | INTRAVENOUS | Status: AC
Start: 2023-10-24 — End: 2023-10-25
  Administered 2023-10-25: 800 [IU]/h via INTRAVENOUS
  Filled 2023-10-24: qty 250

## 2023-10-24 MED ORDER — POTASSIUM CHLORIDE 20 MEQ PO PACK
40.0000 meq | PACK | Freq: Once | ORAL | Status: AC
  Administered 2023-10-24: 40 meq via ORAL
  Filled 2023-10-24: qty 2

## 2023-10-24 MED ORDER — POTASSIUM CHLORIDE 10 MEQ/100ML IV SOLN
10.0000 meq | INTRAVENOUS | Status: DC
  Administered 2023-10-24 (×3): 10 meq via INTRAVENOUS
  Filled 2023-10-24: qty 100

## 2023-10-24 MED ORDER — PANTOPRAZOLE SODIUM 40 MG PO TBEC
40.0000 mg | DELAYED_RELEASE_TABLET | Freq: Every day | ORAL | Status: DC
  Administered 2023-10-24 – 2023-10-25 (×2): 40 mg via ORAL
  Filled 2023-10-24 (×2): qty 1

## 2023-10-24 MED ORDER — MESALAMINE ER 250 MG PO CPCR
1000.0000 mg | ORAL_CAPSULE | Freq: Three times a day (TID) | ORAL | Status: DC
  Administered 2023-10-24 – 2023-10-25 (×4): 1000 mg via ORAL
  Filled 2023-10-24 (×5): qty 4

## 2023-10-24 NOTE — Progress Notes (Signed)
 PHARMACY - ANTICOAGULATION CONSULT NOTE  Pharmacy Consult for heparin  Indication: history of PE, holding apixaban   Allergies  Allergen Reactions   Robaxin  [Methocarbamol ] Itching   Sulfa Antibiotics Itching    ALL SULFA DRUGS    Patient Measurements: Height: 5' 2 (157.5 cm) Weight: 68.2 kg (150 lb 5.7 oz) IBW/kg (Calculated) : 50.1 HEPARIN  DW (KG): 64.3  Vital Signs: Temp: 98.5 F (36.9 C) (04/18 1933) Temp Source: Oral (04/18 1933) BP: 100/56 (04/18 1933) Pulse Rate: 86 (04/18 1933)  Labs: Recent Labs    10/22/23 0523 10/23/23 0548 10/23/23 0849 10/23/23 1804 10/24/23 0140 10/24/23 1207 10/24/23 2057  HGB 7.6*  --  6.7* 7.8* 7.9*  --   --   HCT 22.7*  --  19.9* 23.9* 23.1*  --   --   PLT 119*  --  100*  --  106*  --   --   APTT  --   --   --   --  152* 65* 115*  HEPARINUNFRC  --   --   --   --  >1.10*  --   --   CREATININE 1.22* 1.15*  --   --  1.23*  --   --     Estimated Creatinine Clearance: 37.9 mL/min (A) (by C-G formula based on SCr of 1.23 mg/dL (H)).   Medical History: Past Medical History:  Diagnosis Date   Anxiety attack    Arthritis    Back pain, chronic    Chronic kidney disease    Crohn's colitis (HCC)    Depression (emotion)    Dysrhythmia    GERD (gastroesophageal reflux disease)    Hypertension    Hypothyroid    Iron deficiency anemia, unspecified    Nausea and vomiting 10/22/2023   Neuropathy    Sleep apnea    cpap is not working per patient - needs to find a new doctor - not used since 7/16 per pat    Stage 3 chronic kidney disease (HCC)    Vertigo    followed by Dr Kievie- in [place]     Medications: Apixaban  5 mg PO BID for history of PE -Last dose: 4/16 @ 09:33   Assessment: Pt is a 47 yoF with PMH significant for PE diagnosed in Jan 2025 for which she is prescribed apixaban . PMH also significant for IDA, Chron's disease.  Pharmacy consulted to transition anticoagulation to heparin  drip on 4/16 in anticipation of  EGD.  Last dose of apixaban  given 4/16 @ 09:33  4/17 Heparin  stopped at 06:00 this morning prior to EGD. Discussed with GI Dr. Dianna this afternoon post-EGD > verbal order to resume heparin  drip in 2 hours.  10/24/2023 aPTT 65, just below therapeutic range, on heparin  at 800 units/hr Heparin  level elevated as expected with apixaban  on board Hgb 7.9, plts 106 earlier today RN confirms heparin  infusion rate and indicates no bleeding noted  2057 aPTT 115 slightly supra-therapeutic No bleeding per RN  Goal of Therapy:  Heparin  level 0.3-0.7 units/ml aPTT 66-102 seconds Monitor platelets by anticoagulation protocol: Yes   Plan:  Decrease heparin  back to 800 units/hr Repeat aPTT  in 8 hours Recheck aPTT, heparin  level, CBC daily Once heparin  level and aPTT correlate, can monitor using heparin  level only Monitor for signs of bleeding  Leeroy Mace RPh 10/24/2023, 11:28 PM

## 2023-10-24 NOTE — Progress Notes (Signed)
 PHARMACY - ANTICOAGULATION CONSULT NOTE  Pharmacy Consult for heparin  Indication: history of PE, holding apixaban   Allergies  Allergen Reactions   Robaxin  [Methocarbamol ] Itching   Sulfa Antibiotics Itching    ALL SULFA DRUGS    Patient Measurements: Height: 5' 2 (157.5 cm) Weight: 68.2 kg (150 lb 5.7 oz) IBW/kg (Calculated) : 50.1 HEPARIN  DW (KG): 64.3  Vital Signs: Temp: 99.3 F (37.4 C) (04/18 1140) Temp Source: Oral (04/18 1140) BP: 111/58 (04/18 1140) Pulse Rate: 121 (04/18 1140)  Labs: Recent Labs    10/22/23 0523 10/23/23 0548 10/23/23 0849 10/23/23 1804 10/24/23 0140 10/24/23 1207  HGB 7.6*  --  6.7* 7.8* 7.9*  --   HCT 22.7*  --  19.9* 23.9* 23.1*  --   PLT 119*  --  100*  --  106*  --   APTT  --   --   --   --  152* 65*  HEPARINUNFRC  --   --   --   --  >1.10*  --   CREATININE 1.22* 1.15*  --   --  1.23*  --     Estimated Creatinine Clearance: 37.9 mL/min (A) (by C-G formula based on SCr of 1.23 mg/dL (H)).   Medical History: Past Medical History:  Diagnosis Date   Anxiety attack    Arthritis    Back pain, chronic    Chronic kidney disease    Crohn's colitis (HCC)    Depression (emotion)    Dysrhythmia    GERD (gastroesophageal reflux disease)    Hypertension    Hypothyroid    Iron deficiency anemia, unspecified    Nausea and vomiting 10/22/2023   Neuropathy    Sleep apnea    cpap is not working per patient - needs to find a new doctor - not used since 7/16 per pat    Stage 3 chronic kidney disease (HCC)    Vertigo    followed by Dr Kievie- in [place]     Medications: Apixaban  5 mg PO BID for history of PE -Last dose: 4/16 @ 09:33   Assessment: Pt is a 76 yoF with PMH significant for PE diagnosed in Jan 2025 for which she is prescribed apixaban . PMH also significant for IDA, Chron's disease.  Pharmacy consulted to transition anticoagulation to heparin  drip on 4/16 in anticipation of EGD.  Last dose of apixaban  given 4/16 @  09:33  4/17 Heparin  stopped at 06:00 this morning prior to EGD. Discussed with GI Dr. Dianna this afternoon post-EGD > verbal order to resume heparin  drip in 2 hours.  10/24/2023 aPTT 65, just below therapeutic range, on heparin  at 800 units/hr Heparin  level elevated as expected with apixaban  on board Hgb 7.9, plts 106 earlier today RN confirms heparin  infusion rate and indicates no bleeding noted  Goal of Therapy:  Heparin  level 0.3-0.7 units/ml aPTT 66-102 seconds Monitor platelets by anticoagulation protocol: Yes   Plan:  Increase Heparin  to 850 units/hr Repeat aPTT at 9 pm Recheck aPTT, heparin  level, CBC daily Once heparin  level and aPTT correlate, can monitor using heparin  level only Monitor for signs of bleeding  Darina Garret, PharmD, BCPS Clinical Pharmacist 10/24/2023  12:33 PM

## 2023-10-24 NOTE — Progress Notes (Signed)
 PHARMACY - ANTICOAGULATION CONSULT NOTE  Pharmacy Consult for heparin  Indication: history of PE, holding apixaban   Allergies  Allergen Reactions   Robaxin  [Methocarbamol ] Itching   Sulfa Antibiotics Itching    ALL SULFA DRUGS    Patient Measurements: Height: 5\' 2"  (157.5 cm) Weight: 68.2 kg (150 lb 5.7 oz) IBW/kg (Calculated) : 50.1 HEPARIN  DW (KG): 64.3  Vital Signs: Temp: 100.1 F (37.8 C) (04/17 2056) Temp Source: Oral (04/17 2056) BP: 129/61 (04/17 2056) Pulse Rate: 98 (04/17 2056)  Labs: Recent Labs    10/22/23 0523 10/23/23 0548 10/23/23 0849 10/23/23 1804 10/24/23 0140  HGB 7.6*  --  6.7* 7.8* 7.9*  HCT 22.7*  --  19.9* 23.9* 23.1*  PLT 119*  --  100*  --  106*  APTT  --   --   --   --  152*  HEPARINUNFRC  --   --   --   --  >1.10*  CREATININE 1.22* 1.15*  --   --  1.23*    Estimated Creatinine Clearance: 37.9 mL/min (A) (by C-G formula based on SCr of 1.23 mg/dL (H)).   Medical History: Past Medical History:  Diagnosis Date   Anxiety attack    Arthritis    Back pain, chronic    Chronic kidney disease    Crohn's colitis (HCC)    Depression (emotion)    Dysrhythmia    GERD (gastroesophageal reflux disease)    Hypertension    Hypothyroid    Iron deficiency anemia, unspecified    Nausea and vomiting 10/22/2023   Neuropathy    Sleep apnea    cpap is not working per patient - needs to find a new doctor - not used since 7/16 per pat    Stage 3 chronic kidney disease (HCC)    Vertigo    followed by Dr Kievie- in [place]     Medications: Apixaban  5 mg PO BID for history of PE -Last dose: 4/16 @ 09:33   Assessment: Pt is a 89 yoF with PMH significant for PE diagnosed in Jan 2025 for which she is prescribed apixaban . PMH also significant for IDA, Chron's disease.  Pharmacy consulted to transition anticoagulation to heparin  drip on 4/16 in anticipation of EGD.  Last dose of apixaban  given 4/16 @ 09:33  4/17 Heparin  stopped at 06:00 this morning  prior to EGD. Discussed with GI Dr. Honey Lusty this afternoon post-EGD > verbal order to resume heparin  drip in 2 hours.  10/24/2023 aPTT 152 supra-therapeutic on 1000 units/hr Heparin  level elevated as expected with apixaban  on board Hgb 7.9, plts 106 No bleeding noted  Goal of Therapy:  Heparin  level 0.3-0.7 units/ml aPTT 66-102 seconds Monitor platelets by anticoagulation protocol: Yes   Plan:  Hold heparin  x 1 hour then decrease heparin  drip to 800 units/hr Check 8 hour aPTT CBC, HL/aPTT daily Once heparin  level and aPTT correlate, can monitor using heparin  level only Monitor for signs of bleeding  [place] RPh 10/24/2023, 2:59 AM

## 2023-10-24 NOTE — Plan of Care (Signed)
  Problem: Education: Goal: Knowledge of General Education information will improve Description: Including pain rating scale, medication(s)/side effects and non-pharmacologic comfort measures Outcome: Progressing   Problem: Health Behavior/Discharge Planning: Goal: Ability to manage health-related needs will improve Outcome: Progressing   Problem: Clinical Measurements: Goal: Ability to maintain clinical measurements within normal limits will improve Outcome: Progressing Goal: Will remain free from infection Outcome: Progressing Goal: Diagnostic test results will improve Outcome: Progressing Goal: Respiratory complications will improve Outcome: Progressing Goal: Cardiovascular complication will be avoided Outcome: Progressing   Problem: Nutrition: Goal: Adequate nutrition will be maintained Outcome: Progressing   Problem: Elimination: Goal: Will not experience complications related to bowel motility Outcome: Progressing Goal: Will not experience complications related to urinary retention Outcome: Progressing   Problem: Safety: Goal: Ability to remain free from injury will improve Outcome: Progressing   Problem: Skin Integrity: Goal: Risk for impaired skin integrity will decrease Outcome: Progressing   Problem: Activity: Goal: Risk for activity intolerance will decrease Outcome: Not Progressing   Problem: Coping: Goal: Level of anxiety will decrease Outcome: Not Progressing   Problem: Pain Managment: Goal: General experience of comfort will improve and/or be controlled Outcome: Not Progressing

## 2023-10-24 NOTE — Progress Notes (Signed)
 Jessica [name] 10:42 AM  Subjective: Patient seen and examined and discussed with my partner Dr. Dianna and her hospital computer chart reviewed and she believes her rinvok which she has been on for 2 months is causing her symptoms and the only Crohn's medicine she liked was Pentasa  back in the 80s and she has multiple other complaints but generally just does not feel good and does have a private gastroenterologist and has not tried Remicade or Humira and has multiple complaints about cost insurance etc. but no new complaints  Objective: Vital signs stable afebrile no acute distress abdomen is soft nontender labs all stable  Assessment: History of Crohn's disease with stable CT multiple other complaints  Plan: Agree with stopping rinvok and here in the hospital she can take oral Pentasa  but she does not like the 12 pills a day and you can discharge her on mesalamine  0.3754 pills a day which would be generic Apriso  which is very similar to Pentasa  and you can reiterate that to her and I discussed that with her and her husband answered all of their questions and then she can follow-up with her primary gastroenterologist to decide any workup plans or medicine changes or just monitor how she does on her 5-ASA please call me this weekend if I can be of any further assistance with this hospital stay  [facility]  office [phone] After 5PM or if no answer call [phone]

## 2023-10-24 NOTE — Plan of Care (Signed)

## 2023-10-24 NOTE — Progress Notes (Signed)
 PROGRESS NOTE    Jessica [name]  FMW:[id number] DOB: September 27, 1950 DOA: 10/18/2023 PCP: [name], MD  Brief Narrative:  73 years old female with PMH significant for Crohn's disease with prior right hemicolectomy, immunocompromised, leukopenia, PE on anticoagulation, cirrhosis by imaging, hypertension, CKD stage IIIb, hypothyroidism, iron deficiency anemia, mood disorder presented in the ED with persistent nausea and vomiting and diarrhea.  Patient reports after having endoscopy on 4/1 . She began to develop nonproductive cough.  She was prescribed a course of Augmentin  by GI for possible aspiration associated with the procedure.  She could not finish the antibiotics because she has developed nausea and vomiting.  She came to the ED on 4/9 and was discharged, she was also prescribed a course of doxycycline  but is unclear if she has completed it.  She was found to be septic on clinical and lab work.  So far the cause has been unknown.  Patient is admitted and started on empiric antibiotics.   chest xray nad Mri lumbar spine nad  Assessment & Plan:   Principal Problem:   Fever Active Problems:   Nausea and vomiting   Pancytopenia (HCC)   Sepsis-ruled out patient was initially admitted with fever tachypnea tachycardia and hypotension with leukopenia however cultures and workup did not reveal any evidence of acute infection.  Workup included unremarkable UA, CT abdomen pelvis no acute abnormalities.  She was initially started on vancomycin  cefepime  and Flagyl  due to immunocompromise status Rinvoq  for Crohn's disease.  Stool C. difficile was negative(she has chronic diarrhea) complete GI panel was negative she was treated with sepsis protocol IV fluids.  She completed 3 days of Vanco cefepime  and Flagyl  then it was stopped. She continues to feel sick nauseated unable to keep anything down patient had EGD done on 10/23/2023 which showed gastritis and esophagitis.  Biopsies pending. She was started on a  PPI.  Nausea with vomiting?  (Chronic diarrhea )possible side effects from all the antibiotics she has taken for the last 10 days to 2 weeks including doxycycline  and Augmentin   Chart review noted with EGD was attempted at [facility] however stomach was covered with food they were unable to do any biopsy or see the stomach GI consulted here and patient had EGD done on 10/23/2023.  Continue symptomatic treatments.    History of Crohn's disease, prior right hemicolectomy, immunosuppressed:  Holding home Rinvoq .  Continue home Bentyl  as needed.  Outpatient f/u with GI. PENTASA  started by gi noted   Leukopenia/pancytopenia- improving  Suspect related to immunosuppressive agent, follow-up blood counts outpatient.   Hypomagnesemia: resolved   History of PE:  -Holding apixaban   -On heparin  drip  Cirrhosis by imaging:  --Follow-up GI outpatient.   Hypertension: currently with soft BP's, stable MAP's --Continue holding home amlodipine , spironolactone  --Resume regimen gradually as BP allows -hold metoprolol     CKD stage IIIb: Stable. Baseline creatinine 1.3-1.4, Cr was 1.6 on admission. --Monitor BMP --Renally dose meds and avoid nephrotoxins   Hypothyroidism:  --Continue  levothyroxine .   IDA:  --Hold home iron given GI upset.   Mood disorder:  --Continue home bupropion    RLS:  --Continue ropinirole , reduced to 3 times daily.   Chronic pain:  --Continue home hydrocodone -Tylenol  TID prn.   OSA: Not currently using CPAP  -Progressive pancytopenia unclear etiology rule out MDS/CMV/EBV/parvo hepatitis panel abdominal ultrasound will follow-up levels in a.m.improving. S/p 1 unit prbc 4/17  Estimated body mass index is 27.5 kg/m as calculated from the following:  Height as of this encounter: 5' 2 (1.575 m).   Weight as of this encounter: 68.2 kg.  DVT prophylaxis: Heparin  drip code Status: Full code Family Communication: None at bedside Disposition Plan:  Status is:  Inpatient Remains inpatient appropriate because: Acute illness   Consultants:  GI  Procedures: None Antimicrobials: None  Subjective: She feels weak  Objective: Vitals:   10/23/23 1459 10/23/23 1552 10/23/23 2056 10/24/23 0355  BP: (!) 112/53 114/60 129/61 113/67  Pulse: 87 80 98 81  Resp: 17  17 18   Temp:  98.2 F (36.8 C) 100.1 F (37.8 C) 97.9 F (36.6 C)  TempSrc:  Oral Oral Oral  SpO2: 95% 95% 98% 97%  Weight:      Height:        Intake/Output Summary (Last 24 hours) at 10/24/2023 1118 Last data filed at 10/24/2023 0400 Gross per 24 hour  Intake 643.91 ml  Output --  Net 643.91 ml   Filed Weights   10/19/23 0300  Weight: 68.2 kg    Examination:  General exam: Appears calm and comfortable  Respiratory system: Clear to auscultation. Respiratory effort normal. Cardiovascular system: S1 & S2 heard, RRR. No JVD, murmurs, rubs, gallops or clicks. No pedal edema. Gastrointestinal system: Abdomen is nondistended, soft and nontender. No organomegaly or masses felt. Normal bowel sounds heard. Central nervous system: Alert and oriented. No focal neurological deficits. Extremities: Symmetric 5 x 5 power. Skin: No rashes, lesions or ulcers Psychiatry: Judgement and insight appear normal. Mood & affect appropriate.     Data Reviewed: I have personally reviewed following labs and imaging studies  CBC: Recent Labs  Lab 10/18/23 1430 10/19/23 0751 10/22/23 0523 10/23/23 0849 10/23/23 1804 10/24/23 0140  WBC 3.2* 3.4* 2.2* 1.7*  --  2.5*  NEUTROABS 2.4  --   --   --   --   --   HGB 8.5* 8.1* 7.6* 6.7* 7.8* 7.9*  HCT 23.6* 23.7* 22.7* 19.9* 23.9* 23.1*  MCV 92.5 96.7 98.7 99.5  --  95.1  PLT 176 183 119* 100*  --  106*   Basic Metabolic Panel: Recent Labs  Lab 10/19/23 0751 10/20/23 0512 10/22/23 0523 10/23/23 0548 10/24/23 0140  NA 135 133* 134* 135 133*  K 3.7 4.2 3.6 3.6 3.4*  CL 108 107 110 111 110  CO2 17* 18* 17* 18* 17*  GLUCOSE 88 86 79 79 87   BUN 11 11 9 9 8   CREATININE 1.38* 1.41* 1.22* 1.15* 1.23*  CALCIUM  9.0 8.6* 8.4* 7.9* 7.6*  MG 1.5* 2.3  --   --   --   PHOS 2.8 3.7  --   --   --    GFR: Estimated Creatinine Clearance: 37.9 mL/min (A) (by C-G formula based on SCr of 1.23 mg/dL (H)). Liver Function Tests: Recent Labs  Lab 10/18/23 1430 10/23/23 0548 10/24/23 0140  AST 30 32 34  ALT 19 20 20   ALKPHOS 49 63 73  BILITOT 0.9 1.0 1.1  PROT 6.4* 4.9* 4.7*  ALBUMIN  4.1 2.8* 2.7*   No results for input(s): LIPASE, AMYLASE in the last 168 hours. No results for input(s): AMMONIA in the last 168 hours. Coagulation Profile: Recent Labs  Lab 10/18/23 1430  INR 1.4*   Cardiac Enzymes: No results for input(s): CKTOTAL, CKMB, CKMBINDEX, TROPONINI in the last 168 hours. BNP (last 3 results) No results for input(s): PROBNP in the last 8760 hours. HbA1C: No results for input(s): HGBA1C in the last 72 hours. CBG: No  results for input(s): GLUCAP in the last 168 hours. Lipid Profile: No results for input(s): CHOL, HDL, LDLCALC, TRIG, CHOLHDL, LDLDIRECT in the last 72 hours. Thyroid  Function Tests: Recent Labs    10/23/23 0548  TSH 0.438   Anemia Panel: Recent Labs    10/23/23 0548  FERRITIN 1,296*   Sepsis Labs: Recent Labs  Lab 10/18/23 1430  LATICACIDVEN 0.8    Recent Results (from the past 240 hours)  Resp panel by RT-PCR (RSV, Flu A&B, Covid) Anterior Nasal Swab     Status: None   Collection Time: 10/15/23  5:00 PM   Specimen: Anterior Nasal Swab  Result Value Ref Range Status   SARS Coronavirus 2 by RT PCR NEGATIVE NEGATIVE Final    Comment: (NOTE) SARS-CoV-2 target nucleic acids are NOT DETECTED.  The SARS-CoV-2 RNA is generally detectable in upper respiratory specimens during the acute phase of infection. The lowest concentration of SARS-CoV-2 viral copies this assay can detect is 138 copies/mL. A negative result does not preclude SARS-Cov-2 infection and  should not be used as the sole basis for treatment or other patient management decisions. A negative result may occur with  improper specimen collection/handling, submission of specimen other than nasopharyngeal swab, presence of viral mutation(s) within the areas targeted by this assay, and inadequate number of viral copies(<138 copies/mL). A negative result must be combined with clinical observations, patient history, and epidemiological information. The expected result is Negative.  Fact Sheet for Patients:  bloggercourse.com  Fact Sheet for Healthcare Providers:  seriousbroker.it  This test is no t yet approved or cleared by the [place]  FDA and  has been authorized for detection and/or diagnosis of SARS-CoV-2 by FDA under an Emergency Use Authorization (EUA). This EUA will remain  in effect (meaning this test can be used) for the duration of the COVID-19 declaration under Section 564(b)(1) of the Act, 21 U.S.C.section 360bbb-3(b)(1), unless the authorization is terminated  or revoked sooner.       Influenza A by PCR NEGATIVE NEGATIVE Final   Influenza B by PCR NEGATIVE NEGATIVE Final    Comment: (NOTE) The Xpert Xpress SARS-CoV-2/FLU/RSV plus assay is intended as an aid in the diagnosis of influenza from Nasopharyngeal swab specimens and should not be used as a sole basis for treatment. Nasal washings and aspirates are unacceptable for Xpert Xpress SARS-CoV-2/FLU/RSV testing.  Fact Sheet for Patients: bloggercourse.com  Fact Sheet for Healthcare Providers: seriousbroker.it  This test is not yet approved or cleared by the [place]  FDA and has been authorized for detection and/or diagnosis of SARS-CoV-2 by FDA under an Emergency Use Authorization (EUA). This EUA will remain in effect (meaning this test can be used) for the duration of the COVID-19 declaration  under Section 564(b)(1) of the Act, 21 U.S.C. section 360bbb-3(b)(1), unless the authorization is terminated or revoked.     Resp Syncytial Virus by PCR NEGATIVE NEGATIVE Final    Comment: (NOTE) Fact Sheet for Patients: bloggercourse.com  Fact Sheet for Healthcare Providers: seriousbroker.it  This test is not yet approved or cleared by the [place]  FDA and has been authorized for detection and/or diagnosis of SARS-CoV-2 by FDA under an Emergency Use Authorization (EUA). This EUA will remain in effect (meaning this test can be used) for the duration of the COVID-19 declaration under Section 564(b)(1) of the Act, 21 U.S.C. section 360bbb-3(b)(1), unless the authorization is terminated or revoked.  Performed at [facility], [address] [place], [place] [address]   Blood Culture (routine x  2)     Status: None   Collection Time: 10/18/23  2:31 PM   Specimen: BLOOD  Result Value Ref Range Status   Specimen Description   Final    BLOOD RIGHT ANTECUBITAL Performed at [facility], [address] [place], [place] [address]    Special Requests   Final    BOTTLES DRAWN AEROBIC AND ANAEROBIC Blood Culture adequate volume Performed at [facility], [address], [place], [place] [address]    Culture   Final    NO GROWTH 5 DAYS Performed at [facility], [address] [place], [place] [address]    Report Status 10/23/2023 FINAL  Final  Blood Culture (routine x 2)     Status: None   Collection Time: 10/18/23  2:32 PM   Specimen: BLOOD LEFT ARM  Result Value Ref Range Status   Specimen Description   Final    BLOOD LEFT ARM Performed at [facility], [address] [place], [place] [address]    Special Requests   Final    BOTTLES DRAWN AEROBIC AND ANAEROBIC Blood Culture adequate volume Performed at [facility], [address]  [place], [place] [address]    Culture   Final    NO GROWTH 5 DAYS Performed at [facility], [address], [place], [place] [address]    Report Status 10/23/2023 FINAL  Final  Respiratory (~20 pathogens) panel by PCR     Status: None   Collection Time: 10/19/23  4:57 AM   Specimen: Nasopharyngeal Swab; Respiratory  Result Value Ref Range Status   Adenovirus NOT DETECTED NOT DETECTED Final   Coronavirus 229E NOT DETECTED NOT DETECTED Final    Comment: (NOTE) The Coronavirus on the Respiratory Panel, DOES NOT test for the novel  Coronavirus (2019 nCoV)    Coronavirus HKU1 NOT DETECTED NOT DETECTED Final   Coronavirus NL63 NOT DETECTED NOT DETECTED Final   Coronavirus OC43 NOT DETECTED NOT DETECTED Final   Metapneumovirus NOT DETECTED NOT DETECTED Final   Rhinovirus / Enterovirus NOT DETECTED NOT DETECTED Final   Influenza A NOT DETECTED NOT DETECTED Final   Influenza B NOT DETECTED NOT DETECTED Final   Parainfluenza Virus 1 NOT DETECTED NOT DETECTED Final   Parainfluenza Virus 2 NOT DETECTED NOT DETECTED Final   Parainfluenza Virus 3 NOT DETECTED NOT DETECTED Final   Parainfluenza Virus 4 NOT DETECTED NOT DETECTED Final   Respiratory Syncytial Virus NOT DETECTED NOT DETECTED Final   Bordetella pertussis NOT DETECTED NOT DETECTED Final   Bordetella Parapertussis NOT DETECTED NOT DETECTED Final   Chlamydophila pneumoniae NOT DETECTED NOT DETECTED Final   Mycoplasma pneumoniae NOT DETECTED NOT DETECTED Final    Comment: Performed at [facility], [address], [place], [place] [address]  Gastrointestinal Panel by PCR , Stool     Status: None   Collection Time: 10/19/23 11:55 AM   Specimen: Stool  Result Value Ref Range Status   Campylobacter species NOT DETECTED NOT DETECTED Final   Plesimonas shigelloides NOT DETECTED NOT DETECTED Final   Salmonella species NOT DETECTED NOT DETECTED Final   Yersinia enterocolitica NOT DETECTED NOT DETECTED Final   Vibrio species NOT  DETECTED NOT DETECTED Final   Vibrio cholerae NOT DETECTED NOT DETECTED Final   Enteroaggregative E coli (EAEC) NOT DETECTED NOT DETECTED Final   Enteropathogenic E coli (EPEC) NOT DETECTED NOT DETECTED Final   Enterotoxigenic E coli (ETEC) NOT DETECTED NOT DETECTED Final  Shiga like toxin producing E coli (STEC) NOT DETECTED NOT DETECTED Final   Shigella/Enteroinvasive E coli (EIEC) NOT DETECTED NOT DETECTED Final   Cryptosporidium NOT DETECTED NOT DETECTED Final   Cyclospora cayetanensis NOT DETECTED NOT DETECTED Final   Entamoeba histolytica NOT DETECTED NOT DETECTED Final   Giardia lamblia NOT DETECTED NOT DETECTED Final   Adenovirus F40/41 NOT DETECTED NOT DETECTED Final   Astrovirus NOT DETECTED NOT DETECTED Final   Norovirus GI/GII NOT DETECTED NOT DETECTED Final   Rotavirus A NOT DETECTED NOT DETECTED Final   Sapovirus (I, II, IV, and V) NOT DETECTED NOT DETECTED Final    Comment: Performed at [facility], [address] [place], [place] [address]  C Difficile Quick Screen w PCR reflex     Status: None   Collection Time: 10/19/23 11:55 AM   Specimen: Stool  Result Value Ref Range Status   C Diff antigen NEGATIVE NEGATIVE Final   C Diff toxin NEGATIVE NEGATIVE Final   C Diff interpretation No C. difficile detected.  Final    Comment: Performed at [facility], [address], [place], [place] [address]  MRSA Next Gen by PCR, Nasal     Status: None   Collection Time: 10/21/23  8:32 AM   Specimen: Nasal Mucosa; Nasal Swab  Result Value Ref Range Status   MRSA by PCR Next Gen NOT DETECTED NOT DETECTED Final    Comment: (NOTE) The GeneXpert MRSA Assay (FDA approved for NASAL specimens only), is one component of a comprehensive MRSA colonization surveillance program. It is not intended to diagnose MRSA infection nor to guide or monitor treatment for MRSA infections. Test performance is not FDA approved in patients less than 33 years old. Performed  at [facility], [address] [place], [place] [address]   Respiratory (~20 pathogens) panel by PCR     Status: None   Collection Time: 10/21/23 10:43 AM   Specimen: Nasopharyngeal Swab; Respiratory  Result Value Ref Range Status   Adenovirus NOT DETECTED NOT DETECTED Final   Coronavirus 229E NOT DETECTED NOT DETECTED Final    Comment: (NOTE) The Coronavirus on the Respiratory Panel, DOES NOT test for the novel  Coronavirus (2019 nCoV)    Coronavirus HKU1 NOT DETECTED NOT DETECTED Final   Coronavirus NL63 NOT DETECTED NOT DETECTED Final   Coronavirus OC43 NOT DETECTED NOT DETECTED Final   Metapneumovirus NOT DETECTED NOT DETECTED Final   Rhinovirus / Enterovirus NOT DETECTED NOT DETECTED Final   Influenza A NOT DETECTED NOT DETECTED Final   Influenza B NOT DETECTED NOT DETECTED Final   Parainfluenza Virus 1 NOT DETECTED NOT DETECTED Final   Parainfluenza Virus 2 NOT DETECTED NOT DETECTED Final   Parainfluenza Virus 3 NOT DETECTED NOT DETECTED Final   Parainfluenza Virus 4 NOT DETECTED NOT DETECTED Final   Respiratory Syncytial Virus NOT DETECTED NOT DETECTED Final   Bordetella pertussis NOT DETECTED NOT DETECTED Final   Bordetella Parapertussis NOT DETECTED NOT DETECTED Final   Chlamydophila pneumoniae NOT DETECTED NOT DETECTED Final   Mycoplasma pneumoniae NOT DETECTED NOT DETECTED Final    Comment: Performed at [facility], [address] [place], [place] [address]         Radiology Studies: No results found.    Scheduled Meds:  sodium chloride    Intravenous Once   buPROPion   150 mg Oral q morning   dicyclomine   10 mg Oral TID AC   feeding supplement  1 Container  Oral TID BM   levothyroxine   100 mcg Oral Daily   mesalamine   1,000 mg Oral TID   pantoprazole  (PROTONIX ) IV  40 mg Intravenous Q12H   rOPINIRole   0.5 mg Oral TID   rosuvastatin   5 mg Oral Daily   sodium chloride  flush  3 mL Intravenous Q12H   Continuous Infusions:   heparin  800 Units/hr (10/24/23 0435)     LOS: 5 days    Time spent: 38 min Jessica KANDICE Hoots, MD  10/24/2023, 11:18 AM

## 2023-10-25 DIAGNOSIS — R509 Fever, unspecified: Secondary | ICD-10-CM | POA: Diagnosis not present

## 2023-10-25 LAB — COMPREHENSIVE METABOLIC PANEL WITH GFR
ALT: 21 U/L (ref 0–44)
AST: 37 U/L (ref 15–41)
Albumin: 2.6 g/dL — ABNORMAL LOW (ref 3.5–5.0)
Alkaline Phosphatase: 81 U/L (ref 38–126)
Anion gap: 4 — ABNORMAL LOW (ref 5–15)
BUN: 7 mg/dL — ABNORMAL LOW (ref 8–23)
CO2: 18 mmol/L — ABNORMAL LOW (ref 22–32)
Calcium: 7.7 mg/dL — ABNORMAL LOW (ref 8.9–10.3)
Chloride: 111 mmol/L (ref 98–111)
Creatinine, Ser: 1.06 mg/dL — ABNORMAL HIGH (ref 0.44–1.00)
GFR, Estimated: 56 mL/min — ABNORMAL LOW (ref >60)
Glucose, Bld: 91 mg/dL (ref 70–99)
Potassium: 3.7 mmol/L (ref 3.5–5.1)
Sodium: 133 mmol/L — ABNORMAL LOW (ref 135–145)
Total Bilirubin: 0.7 mg/dL (ref 0.0–1.2)
Total Protein: 4.7 g/dL — ABNORMAL LOW (ref 6.5–8.1)

## 2023-10-25 LAB — CBC
HCT: 22.4 % — ABNORMAL LOW (ref 36.0–46.0)
Hemoglobin: 7.7 g/dL — ABNORMAL LOW (ref 12.0–15.0)
MCH: 32.8 pg (ref 26.0–34.0)
MCHC: 34.4 g/dL (ref 30.0–36.0)
MCV: 95.3 fL (ref 80.0–100.0)
Platelets: 90 10*3/uL — ABNORMAL LOW (ref 150–400)
RBC: 2.35 MIL/uL — ABNORMAL LOW (ref 3.87–5.11)
RDW: 15.1 % (ref 11.5–15.5)
WBC: 2.2 10*3/uL — ABNORMAL LOW (ref 4.0–10.5)
nRBC: 0 % (ref 0.0–0.2)

## 2023-10-25 LAB — HEPARIN LEVEL (UNFRACTIONATED): Heparin Unfractionated: 0.92 [IU]/mL — ABNORMAL HIGH (ref 0.30–0.70)

## 2023-10-25 LAB — EPSTEIN-BARR VIRUS VCA, IGM: EBV VCA IgM: <36 U/mL (ref 0.0–35.9)

## 2023-10-25 LAB — APTT: aPTT: 95 s — ABNORMAL HIGH (ref 24–36)

## 2023-10-25 MED ORDER — ONDANSETRON 4 MG PO TBDP: 4.0000 mg | ORAL_TABLET | Freq: Three times a day (TID) | ORAL | 0 refills | Status: DC | PRN

## 2023-10-25 MED ORDER — MESALAMINE ER 0.375 G PO CP24: 375.0000 mg | ORAL_CAPSULE | Freq: Every day | ORAL | 2 refills | Status: DC

## 2023-10-25 MED ORDER — GUAIFENESIN-DM 100-10 MG/5ML PO SYRP
5.0000 mL | ORAL_SOLUTION | ORAL | Status: DC | PRN
  Administered 2023-10-25: 5 mL via ORAL
  Filled 2023-10-25: qty 5

## 2023-10-25 MED ORDER — APIXABAN 5 MG PO TABS
5.0000 mg | ORAL_TABLET | Freq: Two times a day (BID) | ORAL | Status: DC
  Filled 2023-10-25: qty 1

## 2023-10-25 MED ORDER — PANTOPRAZOLE SODIUM 40 MG PO TBEC: 40.0000 mg | DELAYED_RELEASE_TABLET | Freq: Every day | ORAL | 1 refills | Status: DC

## 2023-10-25 NOTE — TOC Progression Note (Signed)
 Transition of Care [facility]) - Progression Note    Patient Details  Name: AUNIKA KIRSTEN MRN: [id number] Date of Birth: 1950-12-16  [facility] CM/SW Contact  Levie Ream, RN Phone Number: 10/25/2023, 3:35 PM  Clinical Narrative:    HH services previously arranged w/ Gasper Karst; Cory at agency notified pt's d/c.   Expected Discharge Plan: Home/Self Care Barriers to Discharge: Continued Medical Work up  Expected Discharge Plan and Services         Expected Discharge Date: 10/25/23                         HH Arranged: PT HH Agency: [facility] Date [facility] Agency Contacted: 10/21/23 Time HH Agency Contacted: 1457 Representative spoke with at [facility] Agency: Cindie   Social Determinants of Health (SDOH) Interventions SDOH Screenings   Food Insecurity: No Food Insecurity (10/19/2023)  Housing: Low Risk  (10/19/2023)  Transportation Needs: No Transportation Needs (10/19/2023)  Utilities: Not At Risk (10/19/2023)  Depression (PHQ2-9): Low Risk  (05/21/2023)  Social Connections: Socially Integrated (07/14/2023)  Tobacco Use: Low Risk  (10/18/2023)    Readmission Risk Interventions    10/21/2023    2:56 PM  Readmission Risk Prevention Plan  Transportation Screening Complete  PCP or Specialist Appt within 3-5 Days Complete  HRI or Home Care Consult Complete  Social Work Consult for Recovery Care Planning/Counseling Complete  Palliative Care Screening Not Applicable  Medication Review Oceanographer) Complete

## 2023-10-25 NOTE — Progress Notes (Signed)
 PHARMACY - ANTICOAGULATION CONSULT NOTE  Pharmacy Consult for heparin  Indication: history of PE, holding apixaban   Allergies  Allergen Reactions   Robaxin  [Methocarbamol ] Itching   Sulfa Antibiotics Itching    ALL SULFA DRUGS    Patient Measurements: Height: 5\' 2"  (157.5 cm) Weight: 68.2 kg (150 lb 5.7 oz) IBW/kg (Calculated) : 50.1 HEPARIN  DW (KG): 64.3  Vital Signs: Temp: 99.2 F (37.3 C) (04/19 0523) Temp Source: Oral (04/19 0523) BP: 110/56 (04/19 0523) Pulse Rate: 100 (04/19 0523)  Labs: Recent Labs    10/23/23 0548 10/23/23 0849 10/23/23 0849 10/23/23 1804 10/24/23 0140 10/24/23 0140 10/24/23 1207 10/24/23 2057 10/25/23 0733  HGB  --  6.7*   < > 7.8* 7.9*  --   --   --   --   HCT  --  19.9*  --  23.9* 23.1*  --   --   --   --   PLT  --  100*  --   --  106*  --   --   --   --   APTT  --   --   --   --  152*   < > 65* 115* 95*  HEPARINUNFRC  --   --   --   --  >1.10*  --   --   --  0.92*  CREATININE 1.15*  --   --   --  1.23*  --   --   --  1.06*   < > = values in this interval not displayed.    Estimated Creatinine Clearance: 44 mL/min (A) (by C-G formula based on SCr of 1.06 mg/dL (H)).   Medical History: Past Medical History:  Diagnosis Date   Anxiety attack    Arthritis    Back pain, chronic    Chronic kidney disease    Crohn's colitis (HCC)    Depression (emotion)    Dysrhythmia    GERD (gastroesophageal reflux disease)    Hypertension    Hypothyroid    Iron deficiency anemia, unspecified    Nausea and vomiting 10/22/2023   Neuropathy    Sleep apnea    cpap is not working per patient - needs to find a new doctor - not used since 7/16 per pat    Stage 3 chronic kidney disease (HCC)    Vertigo    followed by Dr Kievie- in [place]     Medications: Apixaban  5 mg PO BID for history of PE -Last dose: 4/16 @ 09:33   Assessment: Pt is a 20 yoF with PMH significant for PE diagnosed in Jan 2025 for which she is prescribed apixaban . PMH also  significant for IDA, Chron's disease.  Pharmacy consulted to transition anticoagulation to heparin  drip on 4/16 in anticipation of EGD.  Last dose of apixaban  given 4/16 @ 09:33  4/17 Heparin  stopped at 06:00 prior to EGD. Discussed with GI Dr. Honey Lusty post-EGD > verbal order to resume heparin  drip in 2 hours.  10/25/2023 -aPTT 95 therapeutic on heparin  at 800 units/hr -Heparin  level elevated as expected with apixaban  on board -CBC pending -No complications of therapy noted   Goal of Therapy:  Heparin  level 0.3-0.7 units/ml aPTT 66-102 seconds Monitor platelets by anticoagulation protocol: Yes   Plan:  -Continue heparin  infusion at 800 units/hr -Repeat aPTT in 8 hours -Daily heparin  level, CBC daily -Monitor for signs of bleeding, transition back to oral anticoagulation  Lolita Rise, PharmD, BCPS Clinical Pharmacist 10/25/2023 8:54 AM

## 2023-10-25 NOTE — Progress Notes (Addendum)
 Physical Therapy Treatment Patient Details Name: Jessica [name] MRN: [id number] DOB: 04-22-1951 Today's Date: 10/25/2023   History of Present Illness 73 yo female admitted with N/V, fever, sepsis-unknown etiology. Hx including but not limited to: Crohn's disease, immunocompromised, PE, cirrhosis, hypothyroidism, TKA, vertigo, falls, neuropathy, chronic pain    PT Comments  Pt agreeable to working with therapy. Pt reports feeling weak-husband agrees and reports pt has not been eating and drinking well. She walked ~125 feet with a RW. Dyspnea 2/4, HR up to 135 bpm-made RN aware. Discussed HHPT f/u-pt and husband agreeable. Will continue to follow and progress activity as able. Highly recommend daily ambulation in hallway with nursing and/or mobility team.     If plan is discharge home, recommend the following: A little help with walking and/or transfers;A little help with bathing/dressing/bathroom;Assistance with cooking/housework;Assist for transportation;Help with stairs or ramp for entrance   Can travel by private vehicle        Equipment Recommendations  None recommended by PT    Recommendations for Other Services       Precautions / Restrictions Precautions Precautions: Fall Recall of Precautions/Restrictions: Intact Restrictions Weight Bearing Restrictions Per Provider Order: No     Mobility  Bed Mobility               General bed mobility comments: pt sitting EOB    Transfers Overall transfer level: Needs assistance Equipment used: Rolling walker (2 wheels) Transfers: Sit to/from Stand Sit to Stand: Contact guard assist           General transfer comment: increased time, cues for hand placement and safety    Ambulation/Gait Ambulation/Gait assistance: Contact guard assist, Min assist Gait Distance (Feet): 125 Feet   Gait Pattern/deviations: Step-through pattern, Decreased stride length, Trunk flexed       General Gait Details: Intermittent assist to  steady and maintain RW positioning. HR up to 135 bpm, dyspnea 2/4. Pt fatigues fairly easily and reports feeling weak   Stairs             Wheelchair Mobility     Tilt Bed    Modified Rankin (Stroke Patients Only)       Balance Overall balance assessment: Needs assistance         Standing balance support: Bilateral upper extremity supported, During functional activity, Reliant on assistive device for balance Standing balance-Leahy Scale: Fair                              Hotel manager: No apparent difficulties  Cognition Arousal: Alert Behavior During Therapy: WFL for tasks assessed/performed   PT - Cognitive impairments: Memory                       PT - Cognition Comments: frustrated d/t ongoing medical issues/not feeling well. some mild short term memory impairment Following commands: Intact      Cueing Cueing Techniques: Verbal cues  Exercises      General Comments        Pertinent Vitals/Pain Pain Assessment Pain Assessment: No/denies pain    Home Living                          Prior Function            PT Goals (current goals can now be found in the care plan section) Progress towards PT goals: Progressing toward  goals    Frequency    Min 3X/week      PT Plan      Co-evaluation              AM-PAC PT "6 Clicks" Mobility   Outcome Measure  Help needed turning from your back to your side while in a flat bed without using bedrails?: None Help needed moving from lying on your back to sitting on the side of a flat bed without using bedrails?: None Help needed moving to and from a bed to a chair (including a wheelchair)?: A Little Help needed standing up from a chair using your arms (e.g., wheelchair or bedside chair)?: A Little Help needed to walk in hospital room?: A Little Help needed climbing 3-5 steps with a railing? : A Lot 6 Click Score: 19    End of  Session Equipment Utilized During Treatment: Gait belt Activity Tolerance: Patient tolerated treatment well;Patient limited by fatigue Patient left: in bed;with call bell/phone within reach;with family/visitor present Nurse Communication:  (HR up to 135 bpm) PT Visit Diagnosis: Muscle weakness (generalized) (M62.81);Difficulty in walking, not elsewhere classified (R26.2)     Time: 5784-6962 PT Time Calculation (min) (ACUTE ONLY): 11 min  Charges:    $Gait Training: 8-22 mins PT General Charges $$ ACUTE PT VISIT: 1 Visit                        Tanda Falter, PT Acute Rehabilitation  Office: [phone]

## 2023-10-25 NOTE — Plan of Care (Signed)

## 2023-10-25 NOTE — Discharge Summary (Signed)
 Physician Discharge Summary  Jessica [name] ZOX:[id number] DOB: July 27, 1950 DOA: 10/18/2023  PCP: Orlena Bitters, MD  Admit date: 10/18/2023 Discharge date: 10/25/2023  Admitted From: home Disposition: home Recommendations for Outpatient Follow-up:  Follow up with PCP in 1-2 weeks Please obtain BMP/CBC in one week Dr.Vyas please note we stopped Norvasc  and Aldactone at the time of discharge as she was not getting this during the hospital stay since her blood pressure was normal to soft Follow-up biopsies from EGD  Home Health: Yes Equipment/Devices: None  discharge Condition: Stable CODE STATUS: Full code  diet recommendation: Cardiac Brief/Interim Summary: 73 years old female with PMH significant for Crohn's disease with prior right hemicolectomy, immunocompromised, leukopenia, PE on anticoagulation, cirrhosis by imaging, hypertension, CKD stage IIIb, hypothyroidism, iron deficiency anemia, mood disorder presented in the ED with persistent nausea and vomiting and diarrhea.  Patient reports after having endoscopy on 4/1 . She began to develop nonproductive cough.  She was prescribed a course of Augmentin  by GI for possible aspiration associated with the procedure.  She could not finish the antibiotics because she has developed nausea and vomiting.  She came to the ED on 4/9 and was discharged, she was also prescribed a course of doxycycline  but is unclear if she has completed it.  She was found to be septic on clinical and lab work.  So far the cause has been unknown.  Patient is admitted and started on empiric antibiotics.   chest xray nad Mri lumbar spine nad   Discharge Diagnoses:  Principal Problem:   Febrile illness Active Problems:   Nausea and vomiting   Pancytopenia (HCC)  Sepsis-ruled out patient was initially admitted with fever tachypnea tachycardia and hypotension with leukopenia however cultures and workup did not reveal any evidence of acute infection.  Workup included  unremarkable UA, CT abdomen pelvis no acute abnormalities.  She was initially started on vancomycin  cefepime  and Flagyl  due to immunocompromise status Rinvoq  for Crohn's disease.  Stool C. difficile was negative(she has chronic diarrhea) complete GI panel was negative she was treated with sepsis protocol IV fluids.  She completed 3 days of Vanco cefepime  and Flagyl  then it was stopped. She continues to feel sick nauseated unable to keep anything down, GI was consulted at this point.  Patient had EGD done on 10/23/2023 which showed gastritis and esophagitis.  Biopsies pending. She was started on a PPI.  She will follow-up with Dr. Sammi Crick on discharge   Nausea with vomiting?  This was thought to be multifactorial, multiple antibiotics that she received, side effects from Rinvoq  ?  He did have chronic diarrhea.  However her symptoms of nausea and vomiting improved on PPI and she was able to tolerate a regular diet prior to discharge.      History of Crohn's disease, prior right hemicolectomy, immunosuppressed: She did not want to be put back on Rinvoq .  She was discharged on Apriso  generic for mesalamine  per GI recommendation.   Pancytopenia -likely from all the acute illness that she was going through, she received 1 unit of packed RBC during this hospital stay.  Her FOBT was negative.  On the day of discharge her white count was 2.2 hemoglobin was 7.7 platelets were 90.     Hypomagnesemia: resolved   History of PE: Continue Eliquis .   Cirrhosis by imaging:  --Follow-up GI outpatient.   Hypertension: currently with soft BP's, stable MAP's --Continue holding home amlodipine , spironolactone  Metoprolol  was continued on discharge.  CKD stage IIIb: Stable.  Baseline creatinine 1.3-1.4, Cr was 1.6 on admission. Hypothyroidism:  --Continue  levothyroxine .   IDA:  -Continue new iron supplementation   Mood disorder:  --Continue home bupropion    RLS:  --Continue ropinirole    Chronic pain:   --Continue home hydrocodone -Tylenol  TID prn.   OSA: Not currently using CPAP   Estimated body mass index is 27.5 kg/m as calculated from the following:   Height as of this encounter: 5\' 2"  (1.575 m).   Weight as of this encounter: 68.2 kg.  Discharge Instructions  Discharge Instructions     Diet - low sodium heart healthy   Complete by: As directed    Increase activity slowly   Complete by: As directed       Allergies as of 10/25/2023       Reactions   Robaxin  [methocarbamol ] Itching   Sulfa Antibiotics Itching   ALL SULFA DRUGS        Medication List     STOP taking these medications    amLODipine  10 MG tablet Commonly known as: NORVASC    amoxicillin -clavulanate 875-125 MG tablet Commonly known as: AUGMENTIN    calcitRIOL 0.25 MCG capsule Commonly known as: ROCALTROL   doxycycline  100 MG capsule Commonly known as: VIBRAMYCIN    omeprazole  40 MG capsule Commonly known as: PRILOSEC   Rinvoq  45 MG Tb24 Generic drug: Upadacitinib  ER   spironolactone 50 MG tablet Commonly known as: ALDACTONE       TAKE these medications    acetaminophen  325 MG tablet Commonly known as: TYLENOL  Take 2 tablets (650 mg total) by mouth every 6 (six) hours as needed for mild pain (pain score 1-3), fever or headache (or Fever >/= 101).   Airsupra  90-80 MCG/ACT Aero Generic drug: Albuterol -Budesonide Inhale 2 puffs into the lungs every 8 (eight) hours. What changed:  when to take this reasons to take this   apixaban  5 MG Tabs tablet Commonly known as: ELIQUIS  Take 1 tablet (5 mg total) by mouth 2 (two) times daily.   Azelastine HCl 137 MCG/SPRAY Soln Place 2 sprays into both nostrils 2 (two) times daily as needed (Congestion/ Allergies).   buPROPion  150 MG 24 hr tablet Commonly known as: WELLBUTRIN  XL TAKE 1 TABLET BY MOUTH ONCE DAILY IN THE MORNING   dicyclomine  10 MG capsule Commonly known as: BENTYL  TAKE 1 CAPSULE BY MOUTH TWICE DAILY BEFORE A MEAL What  changed: See the new instructions.   ferrous sulfate  325 (65 FE) MG EC tablet Take 1 tablet (325 mg total) by mouth daily with breakfast.   HYDROcodone -acetaminophen  5-325 MG tablet Commonly known as: NORCO/VICODIN Take 1 tablet by mouth every 6 (six) hours as needed for up to 20 doses for moderate pain (pain score 4-6). What changed: reasons to take this   levothyroxine  100 MCG tablet Commonly known as: SYNTHROID  Take 100 mcg by mouth daily.   mesalamine  0.375 g 24 hr capsule Commonly known as: Apriso  Take 1 capsule (0.375 g total) by mouth daily.   metoprolol  succinate 25 MG 24 hr tablet Commonly known as: TOPROL -XL Take 1.5 tablets (37.5 mg total) by mouth daily. TAKE 1 & 1/2 (ONE & ONE-HALF) TABLETS BY MOUTH ONCE DAILY What changed:  how much to take additional instructions   multivitamin with minerals Tabs tablet Take 1 tablet by mouth daily.   ondansetron  4 MG disintegrating tablet Commonly known as: ZOFRAN -ODT Take 1 tablet (4 mg total) by mouth every 8 (eight) hours as needed. What changed: reasons to take this   pantoprazole  40 MG  tablet Commonly known as: PROTONIX  Take 1 tablet (40 mg total) by mouth daily. Start taking on: October 26, 2023   rOPINIRole  1 MG tablet Commonly known as: REQUIP  Take 3 mg by mouth at bedtime. What changed: Another medication with the same name was removed. Continue taking this medication, and follow the directions you see here.   rosuvastatin  10 MG tablet Commonly known as: Crestor  Take 1 tablet (10 mg total) by mouth daily.   sodium bicarbonate 650 MG tablet Take 650 mg by mouth 2 (two) times daily.        Follow-up Information     [facility] Follow up.   Specialty: Home Health Services Why: Gasper Karst will follow up with you at discharge to provide home health physical therapy Contact information: [address] [place] [address] [phone]         Orlena Bitters, MD Follow up.    Specialty: Internal Medicine Why: check cbc cmp next week Contact information: [address] [place] [address] [phone]         Urban Garden, MD Follow up.   Specialty: Gastroenterology Contact information: [address] [place] [address] [phone]                Allergies  Allergen Reactions   Robaxin  [Methocarbamol ] Itching   Sulfa Antibiotics Itching    ALL SULFA DRUGS    Consultations: GI   Procedures/Studies: MR Lumbar Spine W Wo Contrast Result Date: 10/22/2023 CLINICAL DATA:  73 year old female with low back pain and weakness. Fever and diarrhea. EXAM: MRI LUMBAR SPINE WITHOUT AND WITH CONTRAST TECHNIQUE: Multiplanar and multiecho pulse sequences of the lumbar spine were obtained without and with intravenous contrast. CONTRAST:  7mL GADAVIST  GADOBUTROL  1 MMOL/ML IV SOLN COMPARISON:  CT Abdomen and Pelvis 10/18/2023. CT Chest, Abdomen, and Pelvis 10/15/2023. FINDINGS: Segmentation: Transitional anatomy demonstrated on the 10/15/2023 CT with partially lumbarized S1 level and nearly full size S1-S2 disc space. Correlation with radiographs is recommended prior to any operative intervention. Alignment: Stable lumbar lordosis. No significant spondylolisthesis or scoliosis. Vertebrae: Normal background bone marrow signal. Mostly chronic degenerative endplate marrow signal changes in the lower thoracic spine at T11-T12, at the lumbosacral junction L5-S1. Faint if any degenerative endplate marrow edema at those levels. Intact visible sacrum and SI joints. Partially visible right hip arthroplasty artifact. Conus medullaris and cauda equina: Conus extends to the L1 level. No lower spinal cord or conus signal abnormality. No abnormal intradural enhancement. Normal cauda equina nerve roots. Capacious lumbar spinal canal. Paraspinal and other soft tissues: Questionable pleural effusions at the costophrenic angles. Stable and negative visible  abdominal viscera. Negative visualized posterior paraspinal soft tissues. Disc levels: Advanced T11-T12 disc degeneration with vacuum disc there on the recent CT. Disc bulging and endplate spurring. Mild to moderate facet hypertrophy greater on the left. No significant stenosis. T12-L1: Through L3-L4: Normal for age disc spaces. Mild to moderate facet and ligament flavum hypertrophy. No spinal or lateral recess stenosis. L4-L5: Negative disc. Moderate to severe facet and ligament flavum hypertrophy. No significant stenosis. L5-S1: Advanced disc degeneration and disc space loss. Vacuum disc here on the recent CT. Chronic laminectomy changes. Moderate to severe residual facet hypertrophy. No spinal stenosis. Mild to moderate lateral recess stenosis (descending S1 nerve levels) and bilateral L5 neural foraminal stenosis, greater on the left. S1-S2: Partially lumbarized and otherwise negative. IMPRESSION: 1. Transitional anatomy with partially lumbarized S1. Correlation with radiographs is recommended  prior to any operative intervention. 2. No acute or inflammatory abnormality in the lumbar spine. And mild for age spine degeneration at most levels. No lumbar spinal stenosis. Multifactorial mild to moderate bilateral neural foraminal and lateral recess stenosis at L5-S1. Query L5 and/or S1 radiculitis. Electronically Signed   By: Marlise Simpers M.D.   On: 10/22/2023 13:36   CT ABDOMEN PELVIS WO CONTRAST Result Date: 10/19/2023 CLINICAL DATA:  Fever and diarrhea for 1 week EXAM: CT ABDOMEN AND PELVIS WITHOUT CONTRAST TECHNIQUE: Multidetector CT imaging of the abdomen and pelvis was performed following the standard protocol without IV contrast. RADIATION DOSE REDUCTION: This exam was performed according to the departmental dose-optimization program which includes automated exposure control, adjustment of the mA and/or kV according to patient size and/or use of iterative reconstruction technique. COMPARISON:  10/15/2023  FINDINGS: Lower chest: Lung bases show no focal infiltrate or sizable effusion. Hepatobiliary: Scattered calcifications are noted within the liver stable from the prior exam. Mild nodularity of the liver is noted stable from the prior study. Gallbladder has been surgically removed. Pancreas: Unremarkable. No pancreatic ductal dilatation or surrounding inflammatory changes. Spleen: Scattered calcified granulomas are noted within the spleen. Small accessory splenule is noted adjacent to the left kidney. Adrenals/Urinary Tract: Adrenal glands are within normal limits. Small cyst is noted within the right kidney stable from the prior exam. No further follow-up is recommended. No renal calculi or obstructive changes are noted. The bladder is well distended. Stomach/Bowel: No obstructive or inflammatory changes of the colon are seen. No significant colonic fluid is noted to correspond with the given clinical history. Postsurgical changes in the right colon consistent with prior resection are noted and stable. Small bowel shows no obstructive change. Stomach is within normal limits. Vascular/Lymphatic: Aortic atherosclerosis. No enlarged abdominal or pelvic lymph nodes. Reproductive: Status post hysterectomy. No adnexal masses. Other: No abdominal wall hernia or abnormality. No abdominopelvic ascites. Musculoskeletal: Right hip replacement is noted. Degenerative changes of the lumbar spine are seen. IMPRESSION: Chronic changes as described above similar to that seen on prior exam. No acute abnormality is noted to correspond with the given clinical history. Emergent results were called by telephone at the time of interpretation on 10/19/2023 at 12:44 am to Dr. Debria Fang TEE , who verbally acknowledged these results. Electronically Signed   By: Violeta Grey M.D.   On: 10/19/2023 00:47   DG Chest Port 1 View Result Date: 10/18/2023 CLINICAL DATA:  Sepsis EXAM: PORTABLE CHEST 1 VIEW COMPARISON:  None Available. FINDINGS: No  definite evidence of lobar infiltrate. Heart mediastinum are not significantly changed. IMPRESSION: 1. No definite evidence of lobar infiltrate, pleural effusion, or pneumothorax. Electronically Signed   By: Reagan Camera M.D.   On: 10/18/2023 14:09   CT CHEST ABDOMEN PELVIS W CONTRAST Result Date: 10/15/2023 CLINICAL DATA:  Sepsis Pt arrived POV with c/o vomiting , cough, diarrhea, fever and chills x 1 week. EXAM: CT CHEST, ABDOMEN, AND PELVIS WITH CONTRAST TECHNIQUE: Multidetector CT imaging of the chest, abdomen and pelvis was performed following the standard protocol during bolus administration of intravenous contrast. RADIATION DOSE REDUCTION: This exam was performed according to the departmental dose-optimization program which includes automated exposure control, adjustment of the mA and/or kV according to patient size and/or use of iterative reconstruction technique. CONTRAST:  80mL OMNIPAQUE  IOHEXOL  300 MG/ML  SOLN COMPARISON:  CT chest 09/02/2023 FINDINGS: CT CHEST FINDINGS Cardiovascular: Normal heart size. No significant pericardial effusion. The thoracic aorta is normal in caliber. Severe atherosclerotic plaque  of the thoracic aorta. Left anterior descending coronary artery calcifications. No main pulmonary embolus. Mediastinum/Nodes: Left hilar and mediastinal calcified lymph nodes. No enlarged mediastinal, hilar, or axillary lymph nodes. Thyroid  gland, trachea, and esophagus demonstrate no significant findings. Small hiatal hernia. Lungs/Pleura: Biapical pleural/pulmonary scarring. No focal consolidation. Chronic calcified left lower lobe pulmonary nodule consistent with granuloma. Le. No pulmonary mass. No pleural effusion. No pneumothorax. Musculoskeletal: No chest wall abnormality. No suspicious lytic or blastic osseous lesions. No acute displaced fracture. CT ABDOMEN PELVIS FINDINGS Hepatobiliary: Nodular hepatic contour. Scattered punctate calcifications consistent with sequelae of prior  granulomatous disease. No focal liver abnormality. Status post cholecystectomy a no biliary dilatation. Pancreas: No focal lesion. Normal pancreatic contour. No surrounding inflammatory changes. No main pancreatic ductal dilatation. Spleen: Scattered calcifications consistent with sequelae of prior granulomatous disease. Otherwise normal in size without focal abnormality. Splenule noted. Adrenals/Urinary Tract: No adrenal nodule bilaterally. Bilateral kidneys enhance symmetrically. Fluid density lesions likely represent simple renal cysts. Simple renal cysts, in the absence of clinically indicated signs/symptoms, require no independent follow-up. No hydronephrosis. No hydroureter. The urinary bladder is unremarkable. On delayed imaging, there is faint excretion of intravenous contrast from bilateral collecting systems or ureters. Stomach/Bowel: Stomach is within normal limits. No evidence of bowel wall thickening or dilatation. Status post partial right hemicolectomy and appendectomy. Vascular/Lymphatic: No abdominal aorta or iliac aneurysm. Mild to moderate atherosclerotic plaque of the aorta and its branches. No abdominal, pelvic, or inguinal lymphadenopathy. Reproductive: Status post hysterectomy. No adnexal masses. Other: No intraperitoneal free fluid. No intraperitoneal free gas. No organized fluid collection. Musculoskeletal: No abdominal wall hernia or abnormality. No suspicious lytic or blastic osseous lesions. No acute displaced fracture. Total right hip arthroplasty partially visualized. IMPRESSION: 1. No acute intrathoracic, intra-abdominal, intrapelvic abnormality. 2. Small hiatal hernia. 3. Cirrhosis. No focal liver lesions identified. Please note that liver protocol enhanced MR and CT are the most sensitive tests for the screening detection of hepatocellular carcinoma in the high risk setting of cirrhosis. 4. Sequelae of prior granulomatous disease. 5.  Aortic Atherosclerosis (ICD10-I70.0).  Electronically Signed   By: Morgane  Naveau M.D.   On: 10/15/2023 23:47   DG Chest 2 View Result Date: 10/15/2023 CLINICAL DATA:  Cough EXAM: CHEST - 2 VIEW COMPARISON:  07/14/2023 FINDINGS: Calcified granuloma in the left lower lung. No acute airspace disease or effusion. Normal cardiac size. No pneumothorax IMPRESSION: No active cardiopulmonary disease. Electronically Signed   By: Esmeralda Hedge M.D.   On: 10/15/2023 20:06   (Echo, Carotid, EGD, Colonoscopy, ERCP)    Subjective:  Feels weak ambulated in the hallway prior to discharge she will get home health physical therapy continues to have some dry cough Discharge Exam: Vitals:   10/25/23 0523 10/25/23 1359  BP: (!) 110/56 110/65  Pulse: 100 94  Resp: 18 18  Temp: 99.2 F (37.3 C) 98.2 F (36.8 C)  SpO2: 96% 100%   Vitals:   10/24/23 1500 10/24/23 1933 10/25/23 0523 10/25/23 1359  BP:  (!) 100/56 (!) 110/56 110/65  Pulse: 95 86 100 94  Resp:  18 18 18   Temp:  98.5 F (36.9 C) 99.2 F (37.3 C) 98.2 F (36.8 C)  TempSrc:  Oral Oral Oral  SpO2:  95% 96% 100%  Weight:      Height:        General: Pt is alert, awake, not in acute distress Cardiovascular: RRR, S1/S2 +, no rubs, no gallops Respiratory: CTA bilaterally, no wheezing, no rhonchi Abdominal: Soft, NT, ND,  bowel sounds + Extremities: no edema, no cyanosis    The results of significant diagnostics from this hospitalization (including imaging, microbiology, ancillary and laboratory) are listed below for reference.     Microbiology: Recent Results (from the past 240 hours)  Resp panel by RT-PCR (RSV, Flu A&B, Covid) Anterior Nasal Swab     Status: None   Collection Time: 10/15/23  5:00 PM   Specimen: Anterior Nasal Swab  Result Value Ref Range Status   SARS Coronavirus 2 by RT PCR NEGATIVE NEGATIVE Final    Comment: (NOTE) SARS-CoV-2 target nucleic acids are NOT DETECTED.  The SARS-CoV-2 RNA is generally detectable in upper respiratory specimens during  the acute phase of infection. The lowest concentration of SARS-CoV-2 viral copies this assay can detect is 138 copies/mL. A negative result does not preclude SARS-Cov-2 infection and should not be used as the sole basis for treatment or other patient management decisions. A negative result may occur with  improper specimen collection/handling, submission of specimen other than nasopharyngeal swab, presence of viral mutation(s) within the areas targeted by this assay, and inadequate number of viral copies(<138 copies/mL). A negative result must be combined with clinical observations, patient history, and epidemiological information. The expected result is Negative.  Fact Sheet for Patients:  BloggerCourse.com  Fact Sheet for Healthcare Providers:  SeriousBroker.it  This test is no t yet approved or cleared by the [place]  FDA and  has been authorized for detection and/or diagnosis of SARS-CoV-2 by FDA under an Emergency Use Authorization (EUA). This EUA will remain  in effect (meaning this test can be used) for the duration of the COVID-19 declaration under Section 564(b)(1) of the Act, 21 U.S.C.section 360bbb-3(b)(1), unless the authorization is terminated  or revoked sooner.       Influenza A by PCR NEGATIVE NEGATIVE Final   Influenza B by PCR NEGATIVE NEGATIVE Final    Comment: (NOTE) The Xpert Xpress SARS-CoV-2/FLU/RSV plus assay is intended as an aid in the diagnosis of influenza from Nasopharyngeal swab specimens and should not be used as a sole basis for treatment. Nasal washings and aspirates are unacceptable for Xpert Xpress SARS-CoV-2/FLU/RSV testing.  Fact Sheet for Patients: BloggerCourse.com  Fact Sheet for Healthcare Providers: SeriousBroker.it  This test is not yet approved or cleared by the [place]  FDA and has been authorized for detection and/or  diagnosis of SARS-CoV-2 by FDA under an Emergency Use Authorization (EUA). This EUA will remain in effect (meaning this test can be used) for the duration of the COVID-19 declaration under Section 564(b)(1) of the Act, 21 U.S.C. section 360bbb-3(b)(1), unless the authorization is terminated or revoked.     Resp Syncytial Virus by PCR NEGATIVE NEGATIVE Final    Comment: (NOTE) Fact Sheet for Patients: BloggerCourse.com  Fact Sheet for Healthcare Providers: SeriousBroker.it  This test is not yet approved or cleared by the [place]  FDA and has been authorized for detection and/or diagnosis of SARS-CoV-2 by FDA under an Emergency Use Authorization (EUA). This EUA will remain in effect (meaning this test can be used) for the duration of the COVID-19 declaration under Section 564(b)(1) of the Act, 21 U.S.C. section 360bbb-3(b)(1), unless the authorization is terminated or revoked.  Performed at [facility], [address] [place], [place] [address]   Blood Culture (routine x 2)     Status: None   Collection Time: 10/18/23  2:31 PM   Specimen: BLOOD  Result Value Ref Range Status   Specimen Description   Final  BLOOD RIGHT ANTECUBITAL Performed at [facility], [address] [place], [place] [address]    Special Requests   Final    BOTTLES DRAWN AEROBIC AND ANAEROBIC Blood Culture adequate volume Performed at [facility], [address] [place], [place] [address]    Culture   Final    NO GROWTH 5 DAYS Performed at [facility], [address] [place], [place] [address]    Report Status 10/23/2023 FINAL  Final  Blood Culture (routine x 2)     Status: None   Collection Time: 10/18/23  2:32 PM   Specimen: BLOOD LEFT ARM  Result Value Ref Range Status   Specimen Description   Final    BLOOD LEFT ARM Performed at [facility], [address]  [place], [place] [address]    Special Requests   Final    BOTTLES DRAWN AEROBIC AND ANAEROBIC Blood Culture adequate volume Performed at [facility], [address] [place], [place] [address]    Culture   Final    NO GROWTH 5 DAYS Performed at [facility], [address] [place], [place] [address]    Report Status 10/23/2023 FINAL  Final  Respiratory (~20 pathogens) panel by PCR     Status: None   Collection Time: 10/19/23  4:57 AM   Specimen: Nasopharyngeal Swab; Respiratory  Result Value Ref Range Status   Adenovirus NOT DETECTED NOT DETECTED Final   Coronavirus 229E NOT DETECTED NOT DETECTED Final    Comment: (NOTE) The Coronavirus on the Respiratory Panel, DOES NOT test for the novel  Coronavirus (2019 nCoV)    Coronavirus HKU1 NOT DETECTED NOT DETECTED Final   Coronavirus NL63 NOT DETECTED NOT DETECTED Final   Coronavirus OC43 NOT DETECTED NOT DETECTED Final   Metapneumovirus NOT DETECTED NOT DETECTED Final   Rhinovirus / Enterovirus NOT DETECTED NOT DETECTED Final   Influenza A NOT DETECTED NOT DETECTED Final   Influenza B NOT DETECTED NOT DETECTED Final   Parainfluenza Virus 1 NOT DETECTED NOT DETECTED Final   Parainfluenza Virus 2 NOT DETECTED NOT DETECTED Final   Parainfluenza Virus 3 NOT DETECTED NOT DETECTED Final   Parainfluenza Virus 4 NOT DETECTED NOT DETECTED Final   Respiratory Syncytial Virus NOT DETECTED NOT DETECTED Final   Bordetella pertussis NOT DETECTED NOT DETECTED Final   Bordetella Parapertussis NOT DETECTED NOT DETECTED Final   Chlamydophila pneumoniae NOT DETECTED NOT DETECTED Final   Mycoplasma pneumoniae NOT DETECTED NOT DETECTED Final    Comment: Performed at [facility], [address] [place], [place] [address]  Gastrointestinal Panel by PCR , Stool     Status: None   Collection Time: 10/19/23 11:55 AM   Specimen: Stool  Result Value Ref Range Status   Campylobacter species NOT DETECTED NOT DETECTED Final    Plesimonas shigelloides NOT DETECTED NOT DETECTED Final   Salmonella species NOT DETECTED NOT DETECTED Final   Yersinia enterocolitica NOT DETECTED NOT DETECTED Final   Vibrio species NOT DETECTED NOT DETECTED Final   Vibrio cholerae NOT DETECTED NOT DETECTED Final   Enteroaggregative E coli (EAEC) NOT DETECTED NOT DETECTED Final   Enteropathogenic E coli (EPEC) NOT DETECTED NOT DETECTED Final   Enterotoxigenic E coli (ETEC) NOT DETECTED NOT DETECTED Final   Shiga like toxin producing E coli (STEC) NOT DETECTED NOT DETECTED Final   Shigella/Enteroinvasive E coli (EIEC) NOT DETECTED NOT DETECTED Final   Cryptosporidium NOT DETECTED NOT DETECTED Final  Cyclospora cayetanensis NOT DETECTED NOT DETECTED Final   Entamoeba histolytica NOT DETECTED NOT DETECTED Final   Giardia lamblia NOT DETECTED NOT DETECTED Final   Adenovirus F40/41 NOT DETECTED NOT DETECTED Final   Astrovirus NOT DETECTED NOT DETECTED Final   Norovirus GI/GII NOT DETECTED NOT DETECTED Final   Rotavirus A NOT DETECTED NOT DETECTED Final   Sapovirus (I, II, IV, and V) NOT DETECTED NOT DETECTED Final    Comment: Performed at [facility], [address] [place], [place] [address]  C Difficile Quick Screen w PCR reflex     Status: None   Collection Time: 10/19/23 11:55 AM   Specimen: Stool  Result Value Ref Range Status   C Diff antigen NEGATIVE NEGATIVE Final   C Diff toxin NEGATIVE NEGATIVE Final   C Diff interpretation No C. difficile detected.  Final    Comment: Performed at [facility], [address], [place], [place] [address]  MRSA Next Gen by PCR, Nasal     Status: None   Collection Time: 10/21/23  8:32 AM   Specimen: Nasal Mucosa; Nasal Swab  Result Value Ref Range Status   MRSA by PCR Next Gen NOT DETECTED NOT DETECTED Final    Comment: (NOTE) The GeneXpert MRSA Assay (FDA approved for NASAL specimens only), is one component of a comprehensive MRSA colonization  surveillance program. It is not intended to diagnose MRSA infection nor to guide or monitor treatment for MRSA infections. Test performance is not FDA approved in patients less than 28 years old. Performed at [facility], [address], [place], [place] [address]   Respiratory (~20 pathogens) panel by PCR     Status: None   Collection Time: 10/21/23 10:43 AM   Specimen: Nasopharyngeal Swab; Respiratory  Result Value Ref Range Status   Adenovirus NOT DETECTED NOT DETECTED Final   Coronavirus 229E NOT DETECTED NOT DETECTED Final    Comment: (NOTE) The Coronavirus on the Respiratory Panel, DOES NOT test for the novel  Coronavirus (2019 nCoV)    Coronavirus HKU1 NOT DETECTED NOT DETECTED Final   Coronavirus NL63 NOT DETECTED NOT DETECTED Final   Coronavirus OC43 NOT DETECTED NOT DETECTED Final   Metapneumovirus NOT DETECTED NOT DETECTED Final   Rhinovirus / Enterovirus NOT DETECTED NOT DETECTED Final   Influenza A NOT DETECTED NOT DETECTED Final   Influenza B NOT DETECTED NOT DETECTED Final   Parainfluenza Virus 1 NOT DETECTED NOT DETECTED Final   Parainfluenza Virus 2 NOT DETECTED NOT DETECTED Final   Parainfluenza Virus 3 NOT DETECTED NOT DETECTED Final   Parainfluenza Virus 4 NOT DETECTED NOT DETECTED Final   Respiratory Syncytial Virus NOT DETECTED NOT DETECTED Final   Bordetella pertussis NOT DETECTED NOT DETECTED Final   Bordetella Parapertussis NOT DETECTED NOT DETECTED Final   Chlamydophila pneumoniae NOT DETECTED NOT DETECTED Final   Mycoplasma pneumoniae NOT DETECTED NOT DETECTED Final    Comment: Performed at [facility], [address] [place], [place] [address]     Labs: BNP (last 3 results) Recent Labs    07/14/23 0615  BNP 20.0   Basic Metabolic Panel: Recent Labs  Lab 10/19/23 0751 10/20/23 0512 10/22/23 0523 10/23/23 0548 10/24/23 0140 10/25/23 0733  NA 135 133* 134* 135 133* 133*  K 3.7 4.2 3.6 3.6 3.4* 3.7  CL 108 107  110 111 110 111  CO2 17* 18* 17* 18* 17* 18*  GLUCOSE 88 86 79 79 87 91  BUN  11 11 9 9 8  7*  CREATININE 1.38* 1.41* 1.22* 1.15* 1.23* 1.06*  CALCIUM  9.0 8.6* 8.4* 7.9* 7.6* 7.7*  MG 1.5* 2.3  --   --   --   --   PHOS 2.8 3.7  --   --   --   --    Liver Function Tests: Recent Labs  Lab 10/23/23 0548 10/24/23 0140 10/25/23 0733  AST 32 34 37  ALT 20 20 21   ALKPHOS 63 73 81  BILITOT 1.0 1.1 0.7  PROT 4.9* 4.7* 4.7*  ALBUMIN  2.8* 2.7* 2.6*   No results for input(s): "LIPASE", "AMYLASE" in the last 168 hours. No results for input(s): "AMMONIA" in the last 168 hours. CBC: Recent Labs  Lab 10/19/23 0751 10/22/23 0523 10/23/23 0849 10/23/23 1804 10/24/23 0140 10/25/23 0733  WBC 3.4* 2.2* 1.7*  --  2.5* 2.2*  HGB 8.1* 7.6* 6.7* 7.8* 7.9* 7.7*  HCT 23.7* 22.7* 19.9* 23.9* 23.1* 22.4*  MCV 96.7 98.7 99.5  --  95.1 95.3  PLT 183 119* 100*  --  106* 90*   Cardiac Enzymes: No results for input(s): "CKTOTAL", "CKMB", "CKMBINDEX", "TROPONINI" in the last 168 hours. BNP: Invalid input(s): "POCBNP" CBG: No results for input(s): "GLUCAP" in the last 168 hours. D-Dimer No results for input(s): "DDIMER" in the last 72 hours. Hgb A1c No results for input(s): "HGBA1C" in the last 72 hours. Lipid Profile No results for input(s): "CHOL", "HDL", "LDLCALC", "TRIG", "CHOLHDL", "LDLDIRECT" in the last 72 hours. Thyroid  function studies Recent Labs    10/23/23 0548  TSH 0.438   Anemia work up Recent Labs    10/23/23 0548  FERRITIN 1,296*   Urinalysis    Component Value Date/Time   COLORURINE YELLOW 10/18/2023 1430   APPEARANCEUR CLEAR 10/18/2023 1430   LABSPEC 1.016 10/18/2023 1430   PHURINE 6.5 10/18/2023 1430   GLUCOSEU NEGATIVE 10/18/2023 1430   HGBUR NEGATIVE 10/18/2023 1430   BILIRUBINUR NEGATIVE 10/18/2023 1430   KETONESUR TRACE (A) 10/18/2023 1430   PROTEINUR NEGATIVE 10/18/2023 1430   UROBILINOGEN 0.2 06/19/2014 0452   NITRITE NEGATIVE 10/18/2023 1430    LEUKOCYTESUR NEGATIVE 10/18/2023 1430   Sepsis Labs Recent Labs  Lab 10/22/23 0523 10/23/23 0849 10/24/23 0140 10/25/23 0733  WBC 2.2* 1.7* 2.5* 2.2*   Microbiology Recent Results (from the past 240 hours)  Resp panel by RT-PCR (RSV, Flu A&B, Covid) Anterior Nasal Swab     Status: None   Collection Time: 10/15/23  5:00 PM   Specimen: Anterior Nasal Swab  Result Value Ref Range Status   SARS Coronavirus 2 by RT PCR NEGATIVE NEGATIVE Final    Comment: (NOTE) SARS-CoV-2 target nucleic acids are NOT DETECTED.  The SARS-CoV-2 RNA is generally detectable in upper respiratory specimens during the acute phase of infection. The lowest concentration of SARS-CoV-2 viral copies this assay can detect is 138 copies/mL. A negative result does not preclude SARS-Cov-2 infection and should not be used as the sole basis for treatment or other patient management decisions. A negative result may occur with  improper specimen collection/handling, submission of specimen other than nasopharyngeal swab, presence of viral mutation(s) within the areas targeted by this assay, and inadequate number of viral copies(<138 copies/mL). A negative result must be combined with clinical observations, patient history, and epidemiological information. The expected result is Negative.  Fact Sheet for Patients:  BloggerCourse.com  Fact Sheet for Healthcare Providers:  SeriousBroker.it  This test is no t yet approved or cleared by the [place]  FDA  and  has been authorized for detection and/or diagnosis of SARS-CoV-2 by FDA under an Emergency Use Authorization (EUA). This EUA will remain  in effect (meaning this test can be used) for the duration of the COVID-19 declaration under Section 564(b)(1) of the Act, 21 U.S.C.section 360bbb-3(b)(1), unless the authorization is terminated  or revoked sooner.       Influenza A by PCR NEGATIVE NEGATIVE Final    Influenza B by PCR NEGATIVE NEGATIVE Final    Comment: (NOTE) The Xpert Xpress SARS-CoV-2/FLU/RSV plus assay is intended as an aid in the diagnosis of influenza from Nasopharyngeal swab specimens and should not be used as a sole basis for treatment. Nasal washings and aspirates are unacceptable for Xpert Xpress SARS-CoV-2/FLU/RSV testing.  Fact Sheet for Patients: BloggerCourse.com  Fact Sheet for Healthcare Providers: SeriousBroker.it  This test is not yet approved or cleared by the [place]  FDA and has been authorized for detection and/or diagnosis of SARS-CoV-2 by FDA under an Emergency Use Authorization (EUA). This EUA will remain in effect (meaning this test can be used) for the duration of the COVID-19 declaration under Section 564(b)(1) of the Act, 21 U.S.C. section 360bbb-3(b)(1), unless the authorization is terminated or revoked.     Resp Syncytial Virus by PCR NEGATIVE NEGATIVE Final    Comment: (NOTE) Fact Sheet for Patients: BloggerCourse.com  Fact Sheet for Healthcare Providers: SeriousBroker.it  This test is not yet approved or cleared by the [place]  FDA and has been authorized for detection and/or diagnosis of SARS-CoV-2 by FDA under an Emergency Use Authorization (EUA). This EUA will remain in effect (meaning this test can be used) for the duration of the COVID-19 declaration under Section 564(b)(1) of the Act, 21 U.S.C. section 360bbb-3(b)(1), unless the authorization is terminated or revoked.  Performed at [facility], [address], [place], [place] [address]   Blood Culture (routine x 2)     Status: None   Collection Time: 10/18/23  2:31 PM   Specimen: BLOOD  Result Value Ref Range Status   Specimen Description   Final    BLOOD RIGHT ANTECUBITAL Performed at [facility], [address] [place], [place] [address]     Special Requests   Final    BOTTLES DRAWN AEROBIC AND ANAEROBIC Blood Culture adequate volume Performed at [facility], [address], [place], [place] [address]    Culture   Final    NO GROWTH 5 DAYS Performed at [facility], [address], [place], [place] [address]    Report Status 10/23/2023 FINAL  Final  Blood Culture (routine x 2)     Status: None   Collection Time: 10/18/23  2:32 PM   Specimen: BLOOD LEFT ARM  Result Value Ref Range Status   Specimen Description   Final    BLOOD LEFT ARM Performed at [facility], [address] [place], [place] [address]    Special Requests   Final    BOTTLES DRAWN AEROBIC AND ANAEROBIC Blood Culture adequate volume Performed at [facility], [address] [place], [place] [address]    Culture   Final    NO GROWTH 5 DAYS Performed at [facility], [address] [place], [place] [address]    Report Status 10/23/2023 FINAL  Final  Respiratory (~20 pathogens) panel by PCR     Status: None   Collection Time: 10/19/23  4:57 AM   Specimen: Nasopharyngeal Swab; Respiratory  Result Value Ref Range Status  Adenovirus NOT DETECTED NOT DETECTED Final   Coronavirus 229E NOT DETECTED NOT DETECTED Final    Comment: (NOTE) The Coronavirus on the Respiratory Panel, DOES NOT test for the novel  Coronavirus (2019 nCoV)    Coronavirus HKU1 NOT DETECTED NOT DETECTED Final   Coronavirus NL63 NOT DETECTED NOT DETECTED Final   Coronavirus OC43 NOT DETECTED NOT DETECTED Final   Metapneumovirus NOT DETECTED NOT DETECTED Final   Rhinovirus / Enterovirus NOT DETECTED NOT DETECTED Final   Influenza A NOT DETECTED NOT DETECTED Final   Influenza B NOT DETECTED NOT DETECTED Final   Parainfluenza Virus 1 NOT DETECTED NOT DETECTED Final   Parainfluenza Virus 2 NOT DETECTED NOT DETECTED Final   Parainfluenza Virus 3 NOT DETECTED NOT DETECTED Final   Parainfluenza Virus 4 NOT  DETECTED NOT DETECTED Final   Respiratory Syncytial Virus NOT DETECTED NOT DETECTED Final   Bordetella pertussis NOT DETECTED NOT DETECTED Final   Bordetella Parapertussis NOT DETECTED NOT DETECTED Final   Chlamydophila pneumoniae NOT DETECTED NOT DETECTED Final   Mycoplasma pneumoniae NOT DETECTED NOT DETECTED Final    Comment: Performed at [facility], [address], [place], [place] [address]  Gastrointestinal Panel by PCR , Stool     Status: None   Collection Time: 10/19/23 11:55 AM   Specimen: Stool  Result Value Ref Range Status   Campylobacter species NOT DETECTED NOT DETECTED Final   Plesimonas shigelloides NOT DETECTED NOT DETECTED Final   Salmonella species NOT DETECTED NOT DETECTED Final   Yersinia enterocolitica NOT DETECTED NOT DETECTED Final   Vibrio species NOT DETECTED NOT DETECTED Final   Vibrio cholerae NOT DETECTED NOT DETECTED Final   Enteroaggregative E coli (EAEC) NOT DETECTED NOT DETECTED Final   Enteropathogenic E coli (EPEC) NOT DETECTED NOT DETECTED Final   Enterotoxigenic E coli (ETEC) NOT DETECTED NOT DETECTED Final   Shiga like toxin producing E coli (STEC) NOT DETECTED NOT DETECTED Final   Shigella/Enteroinvasive E coli (EIEC) NOT DETECTED NOT DETECTED Final   Cryptosporidium NOT DETECTED NOT DETECTED Final   Cyclospora cayetanensis NOT DETECTED NOT DETECTED Final   Entamoeba histolytica NOT DETECTED NOT DETECTED Final   Giardia lamblia NOT DETECTED NOT DETECTED Final   Adenovirus F40/41 NOT DETECTED NOT DETECTED Final   Astrovirus NOT DETECTED NOT DETECTED Final   Norovirus GI/GII NOT DETECTED NOT DETECTED Final   Rotavirus A NOT DETECTED NOT DETECTED Final   Sapovirus (I, II, IV, and V) NOT DETECTED NOT DETECTED Final    Comment: Performed at [facility], [address] [place], [place] [address]  C Difficile Quick Screen w PCR reflex     Status: None   Collection Time: 10/19/23 11:55 AM   Specimen: Stool  Result Value Ref Range  Status   C Diff antigen NEGATIVE NEGATIVE Final   C Diff toxin NEGATIVE NEGATIVE Final   C Diff interpretation No C. difficile detected.  Final    Comment: Performed at [facility], [address] [place], [place] [address]  MRSA Next Gen by PCR, Nasal     Status: None   Collection Time: 10/21/23  8:32 AM   Specimen: Nasal Mucosa; Nasal Swab  Result Value Ref Range Status   MRSA by PCR Next Gen NOT DETECTED NOT DETECTED Final    Comment: (NOTE) The GeneXpert MRSA Assay (FDA approved for NASAL specimens only), is one component of a comprehensive MRSA colonization surveillance program. It is not intended to diagnose MRSA infection  nor to guide or monitor treatment for MRSA infections. Test performance is not FDA approved in patients less than 14 years old. Performed at [facility], [address], [place], [place] [address]   Respiratory (~20 pathogens) panel by PCR     Status: None   Collection Time: 10/21/23 10:43 AM   Specimen: Nasopharyngeal Swab; Respiratory  Result Value Ref Range Status   Adenovirus NOT DETECTED NOT DETECTED Final   Coronavirus 229E NOT DETECTED NOT DETECTED Final    Comment: (NOTE) The Coronavirus on the Respiratory Panel, DOES NOT test for the novel  Coronavirus (2019 nCoV)    Coronavirus HKU1 NOT DETECTED NOT DETECTED Final   Coronavirus NL63 NOT DETECTED NOT DETECTED Final   Coronavirus OC43 NOT DETECTED NOT DETECTED Final   Metapneumovirus NOT DETECTED NOT DETECTED Final   Rhinovirus / Enterovirus NOT DETECTED NOT DETECTED Final   Influenza A NOT DETECTED NOT DETECTED Final   Influenza B NOT DETECTED NOT DETECTED Final   Parainfluenza Virus 1 NOT DETECTED NOT DETECTED Final   Parainfluenza Virus 2 NOT DETECTED NOT DETECTED Final   Parainfluenza Virus 3 NOT DETECTED NOT DETECTED Final   Parainfluenza Virus 4 NOT DETECTED NOT DETECTED Final   Respiratory Syncytial Virus NOT DETECTED NOT DETECTED Final    Bordetella pertussis NOT DETECTED NOT DETECTED Final   Bordetella Parapertussis NOT DETECTED NOT DETECTED Final   Chlamydophila pneumoniae NOT DETECTED NOT DETECTED Final   Mycoplasma pneumoniae NOT DETECTED NOT DETECTED Final    Comment: Performed at [facility], [address] [place], [place] [address]     Time coordinating discharge: 38 minutes  SIGNED:   Barbee Lew, MD  [facility] 10/25/2023, 4:33 PM

## 2023-10-27 ENCOUNTER — Other Ambulatory Visit: Payer: Self-pay | Admitting: Gastroenterology

## 2023-10-27 DIAGNOSIS — Z299 Encounter for prophylactic measures, unspecified: Secondary | ICD-10-CM | POA: Diagnosis not present

## 2023-10-27 DIAGNOSIS — K5090 Crohn's disease, unspecified, without complications: Secondary | ICD-10-CM | POA: Diagnosis not present

## 2023-10-27 DIAGNOSIS — Z09 Encounter for follow-up examination after completed treatment for conditions other than malignant neoplasm: Secondary | ICD-10-CM | POA: Diagnosis not present

## 2023-10-27 DIAGNOSIS — D649 Anemia, unspecified: Secondary | ICD-10-CM | POA: Diagnosis not present

## 2023-10-27 DIAGNOSIS — N1831 Chronic kidney disease, stage 3a: Secondary | ICD-10-CM | POA: Diagnosis not present

## 2023-10-27 DIAGNOSIS — R197 Diarrhea, unspecified: Secondary | ICD-10-CM | POA: Diagnosis not present

## 2023-10-28 LAB — HUMAN PARVOVIRUS DNA DETECTION BY PCR: Parvovirus B19, PCR: NEGATIVE

## 2023-10-31 ENCOUNTER — Telehealth: Payer: Self-pay | Admitting: Gastroenterology

## 2023-10-31 DIAGNOSIS — Z96641 Presence of right artificial hip joint: Secondary | ICD-10-CM | POA: Diagnosis not present

## 2023-10-31 DIAGNOSIS — K7460 Unspecified cirrhosis of liver: Secondary | ICD-10-CM | POA: Diagnosis not present

## 2023-10-31 DIAGNOSIS — G2581 Restless legs syndrome: Secondary | ICD-10-CM | POA: Diagnosis not present

## 2023-10-31 DIAGNOSIS — E039 Hypothyroidism, unspecified: Secondary | ICD-10-CM | POA: Diagnosis not present

## 2023-10-31 DIAGNOSIS — K209 Esophagitis, unspecified without bleeding: Secondary | ICD-10-CM | POA: Diagnosis not present

## 2023-10-31 DIAGNOSIS — M48061 Spinal stenosis, lumbar region without neurogenic claudication: Secondary | ICD-10-CM | POA: Diagnosis not present

## 2023-10-31 DIAGNOSIS — Z7901 Long term (current) use of anticoagulants: Secondary | ICD-10-CM | POA: Diagnosis not present

## 2023-10-31 DIAGNOSIS — K5090 Crohn's disease, unspecified, without complications: Secondary | ICD-10-CM | POA: Diagnosis not present

## 2023-10-31 DIAGNOSIS — D509 Iron deficiency anemia, unspecified: Secondary | ICD-10-CM | POA: Diagnosis not present

## 2023-10-31 DIAGNOSIS — Z7951 Long term (current) use of inhaled steroids: Secondary | ICD-10-CM | POA: Diagnosis not present

## 2023-10-31 DIAGNOSIS — D631 Anemia in chronic kidney disease: Secondary | ICD-10-CM | POA: Diagnosis not present

## 2023-10-31 DIAGNOSIS — G8929 Other chronic pain: Secondary | ICD-10-CM | POA: Diagnosis not present

## 2023-10-31 DIAGNOSIS — I129 Hypertensive chronic kidney disease with stage 1 through stage 4 chronic kidney disease, or unspecified chronic kidney disease: Secondary | ICD-10-CM | POA: Diagnosis not present

## 2023-10-31 DIAGNOSIS — D849 Immunodeficiency, unspecified: Secondary | ICD-10-CM | POA: Diagnosis not present

## 2023-10-31 DIAGNOSIS — D61818 Other pancytopenia: Secondary | ICD-10-CM | POA: Diagnosis not present

## 2023-10-31 DIAGNOSIS — F39 Unspecified mood [affective] disorder: Secondary | ICD-10-CM | POA: Diagnosis not present

## 2023-10-31 DIAGNOSIS — I700 Atherosclerosis of aorta: Secondary | ICD-10-CM | POA: Diagnosis not present

## 2023-10-31 DIAGNOSIS — K449 Diaphragmatic hernia without obstruction or gangrene: Secondary | ICD-10-CM | POA: Diagnosis not present

## 2023-10-31 DIAGNOSIS — G4733 Obstructive sleep apnea (adult) (pediatric): Secondary | ICD-10-CM | POA: Diagnosis not present

## 2023-10-31 DIAGNOSIS — K2970 Gastritis, unspecified, without bleeding: Secondary | ICD-10-CM | POA: Diagnosis not present

## 2023-10-31 DIAGNOSIS — Z9181 History of falling: Secondary | ICD-10-CM | POA: Diagnosis not present

## 2023-10-31 DIAGNOSIS — Z86711 Personal history of pulmonary embolism: Secondary | ICD-10-CM | POA: Diagnosis not present

## 2023-10-31 DIAGNOSIS — N1832 Chronic kidney disease, stage 3b: Secondary | ICD-10-CM | POA: Diagnosis not present

## 2023-10-31 NOTE — Telephone Encounter (Signed)
 Patient recently hospitalized in . She follows with Dr. Sammi Crick (only Dr. Tita Form). Need to get her in to see him within the next 2-4 weeks to discuss her chrons treatment.   Julian Obey, MSN, APRN, FNP-BC, AGACNP-BC [facility] at [facility]

## 2023-11-01 ENCOUNTER — Other Ambulatory Visit (HOSPITAL_COMMUNITY): Payer: Self-pay | Admitting: Psychiatry

## 2023-11-03 ENCOUNTER — Telehealth: Payer: Self-pay | Admitting: *Deleted

## 2023-11-03 DIAGNOSIS — Z299 Encounter for prophylactic measures, unspecified: Secondary | ICD-10-CM | POA: Diagnosis not present

## 2023-11-03 DIAGNOSIS — N760 Acute vaginitis: Secondary | ICD-10-CM | POA: Diagnosis not present

## 2023-11-03 DIAGNOSIS — I10 Essential (primary) hypertension: Secondary | ICD-10-CM | POA: Diagnosis not present

## 2023-11-03 DIAGNOSIS — Z79899 Other long term (current) drug therapy: Secondary | ICD-10-CM | POA: Diagnosis not present

## 2023-11-03 DIAGNOSIS — D649 Anemia, unspecified: Secondary | ICD-10-CM | POA: Diagnosis not present

## 2023-11-03 NOTE — Telephone Encounter (Signed)
 Patient called to advise she had been to her PCP today and her CBC lab values were very low.  States she is weak and feeling unwell in general.  Called Dr. Darien Eden' office and lab results are not even available at this time. Office is to fax results asap and I will reach out to Charlton Cooler, NP for recommendations.  Advised patient to report to the ER if she develops symptoms of severe anemia, which she verbalizes she is aware of.

## 2023-11-04 ENCOUNTER — Ambulatory Visit: Admitting: Gastroenterology

## 2023-11-04 DIAGNOSIS — N1832 Chronic kidney disease, stage 3b: Secondary | ICD-10-CM | POA: Diagnosis not present

## 2023-11-04 DIAGNOSIS — K2970 Gastritis, unspecified, without bleeding: Secondary | ICD-10-CM | POA: Diagnosis not present

## 2023-11-04 DIAGNOSIS — K209 Esophagitis, unspecified without bleeding: Secondary | ICD-10-CM | POA: Diagnosis not present

## 2023-11-04 DIAGNOSIS — D61818 Other pancytopenia: Secondary | ICD-10-CM | POA: Diagnosis not present

## 2023-11-04 DIAGNOSIS — I129 Hypertensive chronic kidney disease with stage 1 through stage 4 chronic kidney disease, or unspecified chronic kidney disease: Secondary | ICD-10-CM | POA: Diagnosis not present

## 2023-11-04 DIAGNOSIS — D631 Anemia in chronic kidney disease: Secondary | ICD-10-CM | POA: Diagnosis not present

## 2023-11-05 DIAGNOSIS — K2970 Gastritis, unspecified, without bleeding: Secondary | ICD-10-CM | POA: Diagnosis not present

## 2023-11-05 DIAGNOSIS — I129 Hypertensive chronic kidney disease with stage 1 through stage 4 chronic kidney disease, or unspecified chronic kidney disease: Secondary | ICD-10-CM | POA: Diagnosis not present

## 2023-11-05 DIAGNOSIS — N1832 Chronic kidney disease, stage 3b: Secondary | ICD-10-CM | POA: Diagnosis not present

## 2023-11-05 DIAGNOSIS — I10 Essential (primary) hypertension: Secondary | ICD-10-CM | POA: Diagnosis not present

## 2023-11-05 DIAGNOSIS — K209 Esophagitis, unspecified without bleeding: Secondary | ICD-10-CM | POA: Diagnosis not present

## 2023-11-05 DIAGNOSIS — D631 Anemia in chronic kidney disease: Secondary | ICD-10-CM | POA: Diagnosis not present

## 2023-11-05 DIAGNOSIS — D61818 Other pancytopenia: Secondary | ICD-10-CM | POA: Diagnosis not present

## 2023-11-07 DIAGNOSIS — D61818 Other pancytopenia: Secondary | ICD-10-CM | POA: Diagnosis not present

## 2023-11-07 DIAGNOSIS — K2970 Gastritis, unspecified, without bleeding: Secondary | ICD-10-CM | POA: Diagnosis not present

## 2023-11-07 DIAGNOSIS — I129 Hypertensive chronic kidney disease with stage 1 through stage 4 chronic kidney disease, or unspecified chronic kidney disease: Secondary | ICD-10-CM | POA: Diagnosis not present

## 2023-11-07 DIAGNOSIS — K209 Esophagitis, unspecified without bleeding: Secondary | ICD-10-CM | POA: Diagnosis not present

## 2023-11-07 DIAGNOSIS — N1832 Chronic kidney disease, stage 3b: Secondary | ICD-10-CM | POA: Diagnosis not present

## 2023-11-07 DIAGNOSIS — D631 Anemia in chronic kidney disease: Secondary | ICD-10-CM | POA: Diagnosis not present

## 2023-11-11 DIAGNOSIS — D631 Anemia in chronic kidney disease: Secondary | ICD-10-CM | POA: Diagnosis not present

## 2023-11-11 DIAGNOSIS — D61818 Other pancytopenia: Secondary | ICD-10-CM | POA: Diagnosis not present

## 2023-11-11 DIAGNOSIS — K2970 Gastritis, unspecified, without bleeding: Secondary | ICD-10-CM | POA: Diagnosis not present

## 2023-11-11 DIAGNOSIS — N1832 Chronic kidney disease, stage 3b: Secondary | ICD-10-CM | POA: Diagnosis not present

## 2023-11-11 DIAGNOSIS — I129 Hypertensive chronic kidney disease with stage 1 through stage 4 chronic kidney disease, or unspecified chronic kidney disease: Secondary | ICD-10-CM | POA: Diagnosis not present

## 2023-11-11 DIAGNOSIS — K209 Esophagitis, unspecified without bleeding: Secondary | ICD-10-CM | POA: Diagnosis not present

## 2023-11-12 DIAGNOSIS — D631 Anemia in chronic kidney disease: Secondary | ICD-10-CM | POA: Diagnosis not present

## 2023-11-12 DIAGNOSIS — K2970 Gastritis, unspecified, without bleeding: Secondary | ICD-10-CM | POA: Diagnosis not present

## 2023-11-12 DIAGNOSIS — D61818 Other pancytopenia: Secondary | ICD-10-CM | POA: Diagnosis not present

## 2023-11-12 DIAGNOSIS — I129 Hypertensive chronic kidney disease with stage 1 through stage 4 chronic kidney disease, or unspecified chronic kidney disease: Secondary | ICD-10-CM | POA: Diagnosis not present

## 2023-11-12 DIAGNOSIS — N1832 Chronic kidney disease, stage 3b: Secondary | ICD-10-CM | POA: Diagnosis not present

## 2023-11-12 DIAGNOSIS — K209 Esophagitis, unspecified without bleeding: Secondary | ICD-10-CM | POA: Diagnosis not present

## 2023-11-14 ENCOUNTER — Telehealth (INDEPENDENT_AMBULATORY_CARE_PROVIDER_SITE_OTHER): Admitting: Psychiatry

## 2023-11-14 ENCOUNTER — Encounter (HOSPITAL_COMMUNITY): Payer: Self-pay | Admitting: Psychiatry

## 2023-11-14 DIAGNOSIS — F331 Major depressive disorder, recurrent, moderate: Secondary | ICD-10-CM

## 2023-11-14 MED ORDER — BUPROPION HCL ER (XL) 150 MG PO TB24: 150.0000 mg | ORAL_TABLET | Freq: Every morning | ORAL | 3 refills | Status: DC

## 2023-11-14 NOTE — Progress Notes (Signed)
 [facility] MD/PA/NP OP Progress Note  11/14/2023 11:42 AM Jessica [name]  MRN:  [id number]  Chief Complaint:  Chief Complaint  Patient presents with   Anxiety   Depression   Follow-up   HPI: This patient is a 73 year old married white female who lives with her husband in [place].  She has 2 grown sons.  She retired as a Civil Service fast streamer.  The patient returns for follow-up after about 6 months regarding her depression and anxiety.  Last time I saw her health was not good and has gotten even worse.  Her Crohn's disease has progressed.  She has tried several Biologics for this but it made her even more sick.  Now all of her blood indices are low such as hemoglobin hematocrit WBCs RBCs.  She feels weak and has no appetite.  She is lost quite a bit of weight.  Last time we tapered off Zoloft  because there was concern that it could be contributing to her bleeding.  The only psychiatric medication she is taking is Wellbutrin  XL 150 mg daily.  She does think it is helped to some degree but given all of her medical issues and constant nausea and vomiting is hard for her to go.  Right now she does not want to change anything. Visit Diagnosis:    ICD-10-CM   1. Major depressive disorder, recurrent episode, moderate (HCC)  F33.1       Past Psychiatric History: Previous outpatient treatment  Past Medical History:  Past Medical History:  Diagnosis Date   Anxiety attack    Arthritis    Back pain, chronic    Chronic kidney disease    Crohn's colitis (HCC)    Depression (emotion)    Dysrhythmia    GERD (gastroesophageal reflux disease)    Hypertension    Hypothyroid    Iron deficiency anemia, unspecified    Nausea and vomiting 10/22/2023   Neuropathy    Sleep apnea    cpap is not working per patient - needs to find a new doctor - not used since 7/16 per pat    Stage 3 chronic kidney disease (HCC)    Vertigo    followed by Dr Kievie- in [place]     Past Surgical History:  Procedure Laterality Date    ABDOMINAL HYSTERECTOMY     BIOPSY  06/08/2018   Procedure: BIOPSY;  Surgeon: Ruby Corporal, MD;  [facility];  Service: Endoscopy;;  (colon)   BIOPSY  04/01/2023   Procedure: BIOPSY;  Surgeon: Umberto Ganong, Bearl Limes, MD;  [facility];  Service: Gastroenterology;;   CHOLECYSTECTOMY     COLON SURGERY     for Crohn's in the 80s in [place]    COLONOSCOPY N/A 03/11/2013   Procedure: COLONOSCOPY;  Surgeon: Ruby Corporal, MD;  [facility];  Service: Endoscopy;  Laterality: N/A;  1200   COLONOSCOPY N/A 06/08/2018   Procedure: COLONOSCOPY;  Surgeon: Ruby Corporal, MD;  [facility];  Service: Endoscopy;  Laterality: N/A;  8:25   COLONOSCOPY WITH PROPOFOL  N/A 04/01/2023   Procedure: COLONOSCOPY WITH PROPOFOL ;  Surgeon: Urban Garden, MD;  [facility];  Service: Gastroenterology;  Laterality: N/A;   ESOPHAGEAL BRUSHING  04/01/2023   Procedure: ESOPHAGEAL BRUSHING;  Surgeon: Urban Garden, MD;  [facility];  Service: Gastroenterology;;   ESOPHAGOGASTRODUODENOSCOPY N/A 10/07/2023   Procedure: EGD (ESOPHAGOGASTRODUODENOSCOPY);  Surgeon: Umberto Ganong, Bearl Limes, MD;  [facility];  Service: Gastroenterology;  Laterality: N/A;  215[PM, ASA 3, pt knows to arrive at 6:15   ESOPHAGOGASTRODUODENOSCOPY N/A 10/23/2023   Procedure: EGD (ESOPHAGOGASTRODUODENOSCOPY);  Surgeon: Baldo Bonds, MD;  Location: [facility];  Service: Gastroenterology;  Laterality: N/A;   ESOPHAGOGASTRODUODENOSCOPY (EGD) WITH PROPOFOL  N/A 04/01/2023   Procedure: ESOPHAGOGASTRODUODENOSCOPY (EGD) WITH PROPOFOL ;  Surgeon: Urban Garden, MD;  [facility];  Service: Gastroenterology;  Laterality: N/A;  1:00am;asa 3   fatty tumor removed     HARDWARE REMOVAL Right 08/07/2016   Procedure: HARDWARE REMOVAL;  Surgeon: Liliane Rei, MD;  [facility]: [facility];  Service: Orthopedics;  Laterality: Right;   Hip  replacement      Rt hip in 2010 in [place]   KNEE ARTHROSCOPY Left 03/29/2015   Procedure: ARTHROSCOPY LEFT KNEE WITH MENSICAL DEBRIDEMENT, chondroplasty;  Surgeon: Liliane Rei, MD;  [facility];  Service: Orthopedics;  Laterality: Left;   ORIF TIBIA PLATEAU Right 02/29/2016   Procedure: OPEN REDUCTION INTERNAL FIXATION (ORIF RIGHT  TIBIAL PLATEAU FRACTURE;  Surgeon: Liliane Rei, MD;  [facility];  Service: Orthopedics;  Laterality: Right;   POLYPECTOMY  04/01/2023   Procedure: POLYPECTOMY;  Surgeon: Urban Garden, MD;  [facility];  Service: Gastroenterology;;   TEE WITHOUT CARDIOVERSION N/A 03/27/2021   Procedure: TRANSESOPHAGEAL ECHOCARDIOGRAM (TEE);  Surgeon: Laurann Pollock, MD;  [facility];  Service: Endoscopy;  Laterality: N/A;   TOTAL KNEE ARTHROPLASTY Right 08/25/2017   Procedure: TOTAL RIGHT KNEE ARTHROPLASTY;  Surgeon: Liliane Rei, MD;  [facility];  Service: Orthopedics;  Laterality: Right;    Family Psychiatric History: See below  Family History:  Family History  Problem Relation Age of Onset   Crohn's disease Brother    Depression Mother    Anxiety disorder Mother    Depression Sister    Aneurysm Father    Colon cancer Neg Hx     Social History:  Social History   Socioeconomic History   Marital status: Married    Spouse name: Not on file   Number of children: Not on file   Years of education: Not on file   Highest education level: Not on file  Occupational History   Not on file  Tobacco Use   Smoking status: Never   Smokeless tobacco: Never  Vaping Use   Vaping status: Never Used  Substance and Sexual Activity   Alcohol  use: No    Alcohol /week: 0.0 standard drinks of alcohol    Drug use: No   Sexual activity: Not Currently  Other Topics Concern   Not on file  Social History Narrative   Not on file   Social Drivers of Health   Financial Resource Strain: Not on file  Food Insecurity: No Food  Insecurity (10/19/2023)   Hunger Vital Sign    Worried About Running Out of Food in the Last Year: Never true    Ran Out of Food in the Last Year: Never true  Transportation Needs: No Transportation Needs (10/19/2023)   PRAPARE - Administrator, Civil Service (Medical): No    Lack of Transportation (Non-Medical): No  Physical Activity: Not on file  Stress: Not on file  Social Connections: Socially Integrated (07/14/2023)   Social Connection and Isolation Panel [NHANES]    Frequency of Communication with Friends and Family: More than three times a week    Frequency of Social Gatherings with Friends and Family: More than three times a week    Attends Religious Services: More than 4  times per year    Active Member of Clubs or Organizations: Not on file    Attends Club or Organization Meetings: More than 4 times per year    Marital Status: Married    Allergies:  Allergies  Allergen Reactions   Robaxin  [Methocarbamol ] Itching   Sulfa Antibiotics Itching    ALL SULFA DRUGS    Metabolic Disorder Labs: Lab Results  Component Value Date   HGBA1C 5.1 07/02/2022   No results found for: "PROLACTIN" Lab Results  Component Value Date   CHOL 109 10/07/2023   TRIG 120 10/07/2023   HDL 42 10/07/2023   CHOLHDL 2.6 10/07/2023   VLDL 24 10/07/2023   LDLCALC 43 10/07/2023   LDLCALC 97 07/02/2022   Lab Results  Component Value Date   TSH 0.438 10/23/2023   TSH 0.331 (L) 10/22/2023    Therapeutic Level Labs: No results found for: "LITHIUM" No results found for: "VALPROATE" No results found for: "CBMZ"  Current Medications: Current Outpatient Medications  Medication Sig Dispense Refill   acetaminophen  (TYLENOL ) 325 MG tablet Take 2 tablets (650 mg total) by mouth every 6 (six) hours as needed for mild pain (pain score 1-3), fever or headache (or Fever >/= 101).     Albuterol -Budesonide (AIRSUPRA ) 90-80 MCG/ACT AERO Inhale 2 puffs into the lungs every 8 (eight) hours.  (Patient taking differently: Inhale 2 puffs into the lungs daily as needed (SOB).)     apixaban  (ELIQUIS ) 5 MG TABS tablet Take 1 tablet (5 mg total) by mouth 2 (two) times daily. 60 tablet 4   Azelastine HCl 137 MCG/SPRAY SOLN Place 2 sprays into both nostrils 2 (two) times daily as needed (Congestion/ Allergies).     buPROPion  (WELLBUTRIN  XL) 150 MG 24 hr tablet Take 1 tablet (150 mg total) by mouth every morning. 30 tablet 3   dicyclomine  (BENTYL ) 10 MG capsule TAKE 1 CAPSULE BY MOUTH TWICE DAILY BEFORE A MEAL 60 capsule 0   ferrous sulfate  325 (65 FE) MG EC tablet Take 1 tablet (325 mg total) by mouth daily with breakfast. 90 tablet 3   HYDROcodone -acetaminophen  (NORCO/VICODIN) 5-325 MG tablet Take 1 tablet by mouth every 6 (six) hours as needed for up to 20 doses for moderate pain (pain score 4-6). (Patient taking differently: Take 1 tablet by mouth every 6 (six) hours as needed for moderate pain (pain score 4-6) or severe pain (pain score 7-10).) 20 tablet 0   levothyroxine  (SYNTHROID ) 100 MCG tablet Take 100 mcg by mouth daily.     mesalamine  (APRISO ) 0.375 g 24 hr capsule Take 1 capsule (0.375 g total) by mouth daily. 30 capsule 2   metoprolol  succinate (TOPROL -XL) 25 MG 24 hr tablet Take 1.5 tablets (37.5 mg total) by mouth daily. TAKE 1 & 1/2 (ONE & ONE-HALF) TABLETS BY MOUTH ONCE DAILY (Patient taking differently: Take 25 mg by mouth daily.) 45 tablet 3   Multiple Vitamin (MULTIVITAMIN WITH MINERALS) TABS tablet Take 1 tablet by mouth daily.     ondansetron  (ZOFRAN -ODT) 4 MG disintegrating tablet Take 1 tablet (4 mg total) by mouth every 8 (eight) hours as needed. 14 tablet 0   pantoprazole  (PROTONIX ) 40 MG tablet Take 1 tablet (40 mg total) by mouth daily. 30 tablet 1   rOPINIRole  (REQUIP ) 1 MG tablet Take 3 mg by mouth at bedtime.     rosuvastatin  (CRESTOR ) 10 MG tablet Take 1 tablet (10 mg total) by mouth daily. 90 tablet 2   sodium bicarbonate 650 MG tablet Take 650  mg by mouth 2 (two)  times daily.     No current facility-administered medications for this visit.     Musculoskeletal: Strength & Muscle Tone: na Gait & Station: na Patient leans: N/A  Psychiatric Specialty Exam: Review of Systems  Constitutional:  Positive for appetite change and unexpected weight change.  Gastrointestinal:  Positive for abdominal pain, diarrhea, nausea and vomiting.  Musculoskeletal:  Positive for arthralgias.  Neurological:  Positive for weakness.  All other systems reviewed and are negative.   There were no vitals taken for this visit.There is no height or weight on file to calculate BMI.  General Appearance: NA  Eye Contact:  NA  Speech:  Clear and Coherent  Volume:  Normal  Mood:  Anxious  Affect:  NA  Thought Process:  Goal Directed  Orientation:  Full (Time, Place, and Person)  Thought Content: Rumination   Suicidal Thoughts:  No  Homicidal Thoughts:  No  Memory:  Immediate;   Good Recent;   Good Remote;   NA  Judgement:  Good  Insight:  Fair  Psychomotor Activity:  Decreased  Concentration:  Concentration: Good and Attention Span: Good  Recall:  Good  Fund of Knowledge: Good  Language: Good  Akathisia:  No  Handed:  Right  AIMS (if indicated): not done  Assets:  Communication Skills Desire for Improvement Resilience Social Support  ADL's:  Intact  Cognition: WNL  Sleep:  Fair   Screenings: GAD-7    Garment/textile technologist Visit from 09/30/2022 in [facility] [place] [facility] Visit from 08/23/2021 in [facility] [place]  Total GAD-7 Score 4 14      PHQ2-9    Flowsheet Row Clinical Support from 05/21/2023 in [facility] Visit from 09/30/2022 in [facility] [place] Office Visit from 08/23/2021 in [facility] [place] Video Visit from 12/22/2020 in [facility]  at [facility] Total Score 0 0 1 2  PHQ-9 Total Score -- 3 6 6       Flowsheet Row ED to Hosp-Admission (Discharged) from 10/18/2023 in [facility] ED from 10/15/2023 in [facility] Emergency Department [facility] Admission (Discharged) from 10/07/2023 in [facility]  C-SSRS RISK CATEGORY No Risk No Risk No Risk        Assessment and Plan: This patient is a 73 year old female with a history of depression anxiety and numerous other medical issues such as chronic kidney disease Crohn's disease and low hemoglobin hematocrit etc.  For now she does not want to change anything so we will continue Wellbutrin  XL 150 mg daily.  She will return to see me in 3 months  Collaboration of Care: Collaboration of Care: Other provider involved in patient's care AEB notes are shared with all specialist on the epic system  Patient/Guardian was advised Release of Information must be obtained prior to any record release in order to collaborate their care with an outside provider. Patient/Guardian was advised if they have not already done so to contact the registration department to sign all necessary forms in order for us  to release information regarding their care.   Consent: Patient/Guardian gives verbal consent for treatment and assignment of benefits for services provided during this visit. Patient/Guardian expressed understanding and agreed to proceed.    Alfredia Annas, MD 11/14/2023, 11:43 AM

## 2023-11-17 ENCOUNTER — Other Ambulatory Visit (HOSPITAL_COMMUNITY)
Admission: RE | Admit: 2023-11-17 | Discharge: 2023-11-17 | Disposition: A | Discharge status: Home / Self Care | Source: Ambulatory Visit | Attending: Nephrology | Admitting: Nephrology

## 2023-11-17 DIAGNOSIS — R809 Proteinuria, unspecified: Secondary | ICD-10-CM | POA: Diagnosis not present

## 2023-11-17 DIAGNOSIS — E211 Secondary hyperparathyroidism, not elsewhere classified: Secondary | ICD-10-CM | POA: Diagnosis not present

## 2023-11-17 DIAGNOSIS — D631 Anemia in chronic kidney disease: Secondary | ICD-10-CM | POA: Insufficient documentation

## 2023-11-17 DIAGNOSIS — N189 Chronic kidney disease, unspecified: Secondary | ICD-10-CM | POA: Insufficient documentation

## 2023-11-17 LAB — CBC
HCT: 25.5 % — ABNORMAL LOW (ref 36.0–46.0)
Hemoglobin: 8.6 g/dL — ABNORMAL LOW (ref 12.0–15.0)
MCH: 34.3 pg — ABNORMAL HIGH (ref 26.0–34.0)
MCHC: 33.7 g/dL (ref 30.0–36.0)
MCV: 101.6 fL — ABNORMAL HIGH (ref 80.0–100.0)
Platelets: 176 10*3/uL (ref 150–400)
RBC: 2.51 MIL/uL — ABNORMAL LOW (ref 3.87–5.11)
RDW: 15.1 % (ref 11.5–15.5)
WBC: 2.7 10*3/uL — ABNORMAL LOW (ref 4.0–10.5)
nRBC: 0 % (ref 0.0–0.2)

## 2023-11-17 LAB — RENAL FUNCTION PANEL
Albumin: 2.7 g/dL — ABNORMAL LOW (ref 3.5–5.0)
Anion gap: 5 (ref 5–15)
BUN: 7 mg/dL — ABNORMAL LOW (ref 8–23)
CO2: 21 mmol/L — ABNORMAL LOW (ref 22–32)
Calcium: 8.2 mg/dL — ABNORMAL LOW (ref 8.9–10.3)
Chloride: 110 mmol/L (ref 98–111)
Creatinine, Ser: 1.1 mg/dL — ABNORMAL HIGH (ref 0.44–1.00)
GFR, Estimated: 54 mL/min — ABNORMAL LOW (ref >60)
Glucose, Bld: 83 mg/dL (ref 70–99)
Phosphorus: 3 mg/dL (ref 2.5–4.6)
Potassium: 3.7 mmol/L (ref 3.5–5.1)
Sodium: 136 mmol/L (ref 135–145)

## 2023-11-17 LAB — PROTEIN / CREATININE RATIO, URINE
Creatinine, Urine: 259 mg/dL
Protein Creatinine Ratio: 0.08 mg/mg{creat} (ref 0.00–0.15)
Total Protein, Urine: 22 mg/dL

## 2023-11-18 ENCOUNTER — Telehealth: Payer: Self-pay | Admitting: *Deleted

## 2023-11-18 ENCOUNTER — Ambulatory Visit: Admitting: Adult Health

## 2023-11-18 ENCOUNTER — Other Ambulatory Visit: Payer: Self-pay | Admitting: *Deleted

## 2023-11-18 ENCOUNTER — Encounter: Payer: Self-pay | Admitting: *Deleted

## 2023-11-18 ENCOUNTER — Encounter: Payer: Self-pay | Admitting: Adult Health

## 2023-11-18 ENCOUNTER — Telehealth (INDEPENDENT_AMBULATORY_CARE_PROVIDER_SITE_OTHER): Admitting: Adult Health

## 2023-11-18 ENCOUNTER — Other Ambulatory Visit: Payer: Self-pay | Admitting: Oncology

## 2023-11-18 DIAGNOSIS — G4733 Obstructive sleep apnea (adult) (pediatric): Secondary | ICD-10-CM

## 2023-11-18 DIAGNOSIS — D631 Anemia in chronic kidney disease: Secondary | ICD-10-CM | POA: Diagnosis not present

## 2023-11-18 DIAGNOSIS — K209 Esophagitis, unspecified without bleeding: Secondary | ICD-10-CM | POA: Diagnosis not present

## 2023-11-18 DIAGNOSIS — D61818 Other pancytopenia: Secondary | ICD-10-CM | POA: Diagnosis not present

## 2023-11-18 DIAGNOSIS — K2970 Gastritis, unspecified, without bleeding: Secondary | ICD-10-CM | POA: Diagnosis not present

## 2023-11-18 DIAGNOSIS — N1832 Chronic kidney disease, stage 3b: Secondary | ICD-10-CM | POA: Diagnosis not present

## 2023-11-18 DIAGNOSIS — I129 Hypertensive chronic kidney disease with stage 1 through stage 4 chronic kidney disease, or unspecified chronic kidney disease: Secondary | ICD-10-CM | POA: Diagnosis not present

## 2023-11-18 DIAGNOSIS — D509 Iron deficiency anemia, unspecified: Secondary | ICD-10-CM

## 2023-11-18 LAB — PARATHYROID HORMONE, INTACT (NO CA): PTH: 53 pg/mL (ref 15–65)

## 2023-11-18 LAB — PTH, INTACT AND CALCIUM
Calcium, Total (PTH): 8.1 mg/dL — ABNORMAL LOW (ref 8.7–10.3)
PTH: 52 pg/mL (ref 15–65)

## 2023-11-18 NOTE — Patient Instructions (Addendum)
 Restart BIPAP At bedtime, wear each night as much as your can .  Will decrease BIPAP pressure.  Saline nasal spray Twice daily   Saline nasal gel At bedtime    Airsupra  inhaler as needed.   Continue Eliquis  Twice daily  Avoid all NSAIDs -Ibuprofen, aleve, motrin, excedrin.  Follo wup with Dr. Villa Greaser  in 3 months

## 2023-11-18 NOTE — Progress Notes (Unsigned)
 Virtual Visit via Video Note  I connected with Jessica [name] on 11/18/23 at 11:30 AM EDT by a video enabled telemedicine application and verified that I am speaking with the correct person using two identifiers.  Location: Patient: Home  Provider: Office    I discussed the limitations of evaluation and management by telemedicine and the availability of in person appointments. The patient expressed understanding and agreed to proceed.  History of Present Illness: 73 year old female followed for obstructive sleep apnea.  Diagnosed with acute PE January 2025 on Eliquis -seen by hematology felt likely to need lifelong therapy given inflammation secondary to Crohn's disease.  Today's video visit is a 89-month follow-up.  Patient has sleep apnea on nocturnal BiPAP.  Patient says she has not tolerating BiPAP.  Last visit BiPAP pressures were decreased to IPAP max 20 EPAP minimum 8.  Despite this patient says that she does not like wearing her CPAP.  CPAP compliance shows 33% usage.  Daily average usage at 2 hours.  AHI 0.8/hour.  Patient was treated for possible allergic reaction to Entyvio  in November 2024.  She continued to have ongoing symptoms with nausea, fatigue dizziness, body aches and shortness of breath.  Chest x-ray showed bilateral interstitial opacities.  She was treated for an atypical pneumonia with doxycycline  and prednisone .  In December 2024 she traveled to [place]  via plane and then had extended car travel while away.  In early January 2025.  She presented to the emergency room with acute shortness of breath.  VQ scan was positive for PE.  This was felt to be provoked due to extensive air and road travel.  She was treated with IV heparin  and discharged on Eliquis .  She has been seen by hematology and felt most likely to continue on lifelong anticoagulation with Eliquis  due to chronic inflammation from Crohn's disease.  Last month patient was admitted with febrile illness with fever  tachycardia and hypotension.  Workup was unremarkable with negative urinalysis, CT chest abdomen and pelvis with no acute abnormalities.  She was treated initially with empiric antibiotics with vancomycin , cefepime  and Flagyl  due to immunocompromise state.  C. difficile was negative.  Patient underwent an endoscopy on October 23, 2023 that showed gastritis and esophagitis.  Biopsies were negative for malignancy.  Found to have progressive pancytopenia, unclear etiology. Required 1 unit of PRBC. Since discharge patient is not feeling any better.  Lab work continues to show stable chronic anemia and and leukopenia.  Platelets have returned back to normal  Pulmonary function testing was done in March 2025 that showed minimum restriction with an FEV1 at 86%, ratio 82, FVC 79%, DLCO slightly decreased at 72%  Down 25 lbs  Cut pressure down       Observations/Objective: 10/2022 CPAP titration >> BiPAP 25/21 01/2021 HST showed mod  OSA with AHI 28/ hr 03/2021 TEE mod MR January 2025 VQ scan consistent with PE 2D echo January 2025 showed preserved EF without right heart strain. CT chest September 02, 2023 showed no acute process.  Calcified 6 mm left upper lobe granuloma, scattered thin parenchymal bands in the middle lungs  Assessment and Plan:   Follow Up Instructions:    I discussed the assessment and treatment plan with the patient. The patient was provided an opportunity to ask questions and all were answered. The patient agreed with the plan and demonstrated an understanding of the instructions.   The patient was advised to call back or seek an in-person evaluation if the symptoms worsen or  if the condition fails to improve as anticipated.  I provided *** minutes of non-face-to-face time during this encounter.   Roena Clark, NP

## 2023-11-18 NOTE — Telephone Encounter (Signed)
 Patient called to inquire about Retacrit injections.  Per Charlton Cooler, NP will schedule for Retacrit 10,000 units and lab every 2 weeks with follow up in 6 weeks.  Patient aware and verbalized understanding.

## 2023-11-19 DIAGNOSIS — N2581 Secondary hyperparathyroidism of renal origin: Secondary | ICD-10-CM | POA: Diagnosis not present

## 2023-11-19 DIAGNOSIS — D631 Anemia in chronic kidney disease: Secondary | ICD-10-CM | POA: Diagnosis not present

## 2023-11-19 DIAGNOSIS — N1832 Chronic kidney disease, stage 3b: Secondary | ICD-10-CM | POA: Diagnosis not present

## 2023-11-19 DIAGNOSIS — D61818 Other pancytopenia: Secondary | ICD-10-CM | POA: Diagnosis not present

## 2023-11-19 DIAGNOSIS — K2970 Gastritis, unspecified, without bleeding: Secondary | ICD-10-CM | POA: Diagnosis not present

## 2023-11-19 DIAGNOSIS — I129 Hypertensive chronic kidney disease with stage 1 through stage 4 chronic kidney disease, or unspecified chronic kidney disease: Secondary | ICD-10-CM | POA: Diagnosis not present

## 2023-11-19 DIAGNOSIS — D638 Anemia in other chronic diseases classified elsewhere: Secondary | ICD-10-CM | POA: Diagnosis not present

## 2023-11-19 DIAGNOSIS — K209 Esophagitis, unspecified without bleeding: Secondary | ICD-10-CM | POA: Diagnosis not present

## 2023-11-20 ENCOUNTER — Inpatient Hospital Stay

## 2023-11-20 ENCOUNTER — Encounter: Payer: Self-pay | Admitting: Hematology

## 2023-11-20 ENCOUNTER — Ambulatory Visit (INDEPENDENT_AMBULATORY_CARE_PROVIDER_SITE_OTHER): Admitting: Podiatry

## 2023-11-20 ENCOUNTER — Encounter: Payer: Self-pay | Admitting: Podiatry

## 2023-11-20 ENCOUNTER — Inpatient Hospital Stay: Attending: Hematology

## 2023-11-20 VITALS — BP 119/86 | HR 85 | Temp 97.8°F | Resp 17

## 2023-11-20 DIAGNOSIS — D72819 Decreased white blood cell count, unspecified: Secondary | ICD-10-CM | POA: Insufficient documentation

## 2023-11-20 DIAGNOSIS — N1832 Chronic kidney disease, stage 3b: Secondary | ICD-10-CM | POA: Diagnosis not present

## 2023-11-20 DIAGNOSIS — G5763 Lesion of plantar nerve, bilateral lower limbs: Secondary | ICD-10-CM

## 2023-11-20 DIAGNOSIS — Z79899 Other long term (current) drug therapy: Secondary | ICD-10-CM | POA: Insufficient documentation

## 2023-11-20 DIAGNOSIS — G629 Polyneuropathy, unspecified: Secondary | ICD-10-CM

## 2023-11-20 DIAGNOSIS — K5090 Crohn's disease, unspecified, without complications: Secondary | ICD-10-CM | POA: Diagnosis not present

## 2023-11-20 DIAGNOSIS — I2699 Other pulmonary embolism without acute cor pulmonale: Secondary | ICD-10-CM | POA: Diagnosis not present

## 2023-11-20 DIAGNOSIS — D509 Iron deficiency anemia, unspecified: Secondary | ICD-10-CM | POA: Insufficient documentation

## 2023-11-20 LAB — CBC
HCT: 29.2 % — ABNORMAL LOW (ref 36.0–46.0)
Hemoglobin: 9.5 g/dL — ABNORMAL LOW (ref 12.0–15.0)
MCH: 33.1 pg (ref 26.0–34.0)
MCHC: 32.5 g/dL (ref 30.0–36.0)
MCV: 101.7 fL — ABNORMAL HIGH (ref 80.0–100.0)
Platelets: 190 10*3/uL (ref 150–400)
RBC: 2.87 MIL/uL — ABNORMAL LOW (ref 3.87–5.11)
RDW: 14.6 % (ref 11.5–15.5)
WBC: 3.2 10*3/uL — ABNORMAL LOW (ref 4.0–10.5)
nRBC: 0 % (ref 0.0–0.2)

## 2023-11-20 MED ORDER — EPOETIN ALFA-EPBX 10000 UNIT/ML IJ SOLN
10000.0000 [IU] | Freq: Once | INTRAMUSCULAR | Status: AC
  Administered 2023-11-20: 10000 [IU] via SUBCUTANEOUS
  Filled 2023-11-20: qty 1

## 2023-11-20 NOTE — Progress Notes (Signed)
 Jessica [name] presents today for injection per the provider's orders.  Retacrit administration without incident; injection site WNL; see MAR for injection details.  Patient tolerated procedure well and without incident.  No questions or complaints noted at this time.  Treatment given today per MD orders. Discharged from clinic ambulatory in stable condition. Alert and oriented x 3. F/U with [facility] as scheduled.

## 2023-11-20 NOTE — Progress Notes (Signed)
 She presents today chief concern of pain to her left ankle.  She states that she would like an injection to the area because it has been hurting hurting and that has been worsening over the past couple of months.  She states that not only does not hurt here but it hurts on the top of both feet.  Objective: Vital signs are stable she is alert oriented x 3.  Pulses are palpable.  She has palpable Mulder's click third interdigital space bilateral.  Consistent with neuromas.  Assessment: Neuroma third interspace bilateral.  Subtalar joint capsulitis bilateral.  Plan: I injected the neuromas today with 10 mg Kenalog  5 mg Marcaine .  Tolerated procedure well without complications.

## 2023-11-20 NOTE — Patient Instructions (Signed)
 [facility] [place] - A DEPT OF [facility]  Discharge Instructions: Thank you for choosing  Cancer Center to provide your oncology and hematology care.  If you have a lab appointment with the Cancer Center - please note that after April 8th, 2024, all labs will be drawn in the cancer center.  You do not have to check in or register with the main entrance as you have in the past but will complete your check-in in the cancer center.  Wear comfortable clothing and clothing appropriate for easy access to any Portacath or PICC line.   We strive to give you quality time with your provider. You may need to reschedule your appointment if you arrive late (15 or more minutes).  Arriving late affects you and other patients whose appointments are after yours.  Also, if you miss three or more appointments without notifying the office, you may be dismissed from the clinic at the provider's discretion.      For prescription refill requests, have your pharmacy contact our office and allow 72 hours for refills to be completed.    Today you received the following chemotherapy and/or immunotherapy agents Retacrit. Epoetin Alfa Injection What is this medication? EPOETIN ALFA (e POE e tin AL fa) treats low levels of red blood cells (anemia) caused by kidney disease, chemotherapy, or HIV medications. It can also be used in people who are at risk for blood loss during surgery. It works by Systems analyst make more red blood cells, which reduces the need for blood transfusions. This medicine may be used for other purposes; ask your health care provider or pharmacist if you have questions. COMMON BRAND NAME(S): Epogen, Procrit, Retacrit What should I tell my care team before I take this medication? They need to know if you have any of these conditions: Blood clots Cancer Heart disease High blood pressure On dialysis Seizures Stroke An unusual or allergic reaction to epoetin  alfa, albumin, benzyl alcohol, other medications, foods, dyes, or preservatives Pregnant or trying to get pregnant Breast-feeding How should I use this medication? This medication is injected into a vein or under the skin. It is usually given by your care team in a hospital or clinic setting. It may also be given at home. If you get this medication at home, you will be taught how to prepare and give it. Use exactly as directed. Take it as directed on the prescription label at the same time every day. Keep taking it unless your care team tells you to stop. It is important that you put your used needles and syringes in a special sharps container. Do not put them in a trash can. If you do not have a sharps container, call your pharmacist or care team to get one. A special MedGuide will be given to you by the pharmacist with each prescription and refill. Be sure to read this information carefully each time. Talk to your care team about the use of this medication in children. While this medication may be used in children as young as 1 month of age for selected conditions, precautions do apply. Overdosage: If you think you have taken too much of this medicine contact a poison control center or emergency room at once. NOTE: This medicine is only for you. Do not share this medicine with others. What if I miss a dose? If you miss a dose, take it as soon as you can. If it is almost time for  your next dose, take only that dose. Do not take double or extra doses. What may interact with this medication? Darbepoetin alfa Methoxy polyethylene glycol-epoetin beta This list may not describe all possible interactions. Give your health care provider a list of all the medicines, herbs, non-prescription drugs, or dietary supplements you use. Also tell them if you smoke, drink alcohol, or use illegal drugs. Some items may interact with your medicine. What should I watch for while using this medication? Visit your care  team for regular checks on your progress. Check your blood pressure as directed. Know what your blood pressure should be and when to contact your care team. Your condition will be monitored carefully while you are receiving this medication. You may need blood work while taking this medication. What side effects may I notice from receiving this medication? Side effects that you should report to your care team as soon as possible: Allergic reactions--skin rash, itching, hives, swelling of the face, lips, tongue, or throat Blood clot--pain, swelling, or warmth in the leg, shortness of breath, chest pain Heart attack--pain or tightness in the chest, shoulders, arms, or jaw, nausea, shortness of breath, cold or clammy skin, feeling faint or lightheaded Increase in blood pressure Rash, fever, and swollen lymph nodes Redness, blistering, peeling, or loosening of the skin, including inside the mouth Seizures Stroke--sudden numbness or weakness of the face, arm, or leg, trouble speaking, confusion, trouble walking, loss of balance or coordination, dizziness, severe headache, change in vision Side effects that usually do not require medical attention (report to your care team if they continue or are bothersome): Bone, joint, or muscle pain Cough Headache Nausea Pain, redness, or irritation at injection site This list may not describe all possible side effects. Call your doctor for medical advice about side effects. You may report side effects to FDA at 1-800-FDA-1088. Where should I keep my medication? Keep out of the reach of children and pets. Store in a refrigerator. Do not freeze. Do not shake. Protect from light. Keep this medication in the original container until you are ready to take it. See product for storage information. Get rid of any unused medication after the expiration date. To get rid of medications that are no longer needed or have expired: Take the medication to a medication take-back  program. Check with your pharmacy or law enforcement to find a location. If you cannot return the medication, ask your pharmacist or care team how to get rid of the medication safely. NOTE: This sheet is a summary. It may not cover all possible information. If you have questions about this medicine, talk to your doctor, pharmacist, or health care provider.  2024 Elsevier/Gold Standard (2021-10-26 00:00:00)       To help prevent nausea and vomiting after your treatment, we encourage you to take your nausea medication as directed.  BELOW ARE SYMPTOMS THAT SHOULD BE REPORTED IMMEDIATELY: *FEVER GREATER THAN 100.4 F (38 C) OR HIGHER *CHILLS OR SWEATING *NAUSEA AND VOMITING THAT IS NOT CONTROLLED WITH YOUR NAUSEA MEDICATION *UNUSUAL SHORTNESS OF BREATH *UNUSUAL BRUISING OR BLEEDING *URINARY PROBLEMS (pain or burning when urinating, or frequent urination) *BOWEL PROBLEMS (unusual diarrhea, constipation, pain near the anus) TENDERNESS IN MOUTH AND THROAT WITH OR WITHOUT PRESENCE OF ULCERS (sore throat, sores in mouth, or a toothache) UNUSUAL RASH, SWELLING OR PAIN  UNUSUAL VAGINAL DISCHARGE OR ITCHING   Items with * indicate a potential emergency and should be followed up as soon as possible or go to the Emergency Department  if any problems should occur.  Please show the CHEMOTHERAPY ALERT CARD or IMMUNOTHERAPY ALERT CARD at check-in to the Emergency Department and triage nurse.  Should you have questions after your visit or need to cancel or reschedule your appointment, please contact [facility] [place] - A DEPT OF [facility] [phone]  and follow the prompts.  Office hours are 8:00 a.m. to 4:30 p.m. Monday - Friday. Please note that voicemails left after 4:00 p.m. may not be returned until the following business day.  We are closed weekends and major holidays. You have access to a nurse at all times for urgent questions. Please call the main number to the clinic  [phone] and follow the prompts.  For any non-urgent questions, you may also contact your provider using MyChart. We now offer e-Visits for anyone 88 and older to request care online for non-urgent symptoms. For details visit [email or web address].   Also download the MyChart app! Go to the app store, search "MyChart", open the app, select [place], and log in with your MyChart username and password.

## 2023-11-21 ENCOUNTER — Other Ambulatory Visit: Payer: Self-pay | Admitting: Gastroenterology

## 2023-11-21 DIAGNOSIS — K209 Esophagitis, unspecified without bleeding: Secondary | ICD-10-CM | POA: Diagnosis not present

## 2023-11-21 DIAGNOSIS — D61818 Other pancytopenia: Secondary | ICD-10-CM | POA: Diagnosis not present

## 2023-11-21 DIAGNOSIS — I129 Hypertensive chronic kidney disease with stage 1 through stage 4 chronic kidney disease, or unspecified chronic kidney disease: Secondary | ICD-10-CM | POA: Diagnosis not present

## 2023-11-21 DIAGNOSIS — N1832 Chronic kidney disease, stage 3b: Secondary | ICD-10-CM | POA: Diagnosis not present

## 2023-11-21 DIAGNOSIS — D631 Anemia in chronic kidney disease: Secondary | ICD-10-CM | POA: Diagnosis not present

## 2023-11-21 DIAGNOSIS — K2970 Gastritis, unspecified, without bleeding: Secondary | ICD-10-CM | POA: Diagnosis not present

## 2023-11-26 DIAGNOSIS — K209 Esophagitis, unspecified without bleeding: Secondary | ICD-10-CM | POA: Diagnosis not present

## 2023-11-26 DIAGNOSIS — I129 Hypertensive chronic kidney disease with stage 1 through stage 4 chronic kidney disease, or unspecified chronic kidney disease: Secondary | ICD-10-CM | POA: Diagnosis not present

## 2023-11-26 DIAGNOSIS — K2970 Gastritis, unspecified, without bleeding: Secondary | ICD-10-CM | POA: Diagnosis not present

## 2023-11-26 DIAGNOSIS — D631 Anemia in chronic kidney disease: Secondary | ICD-10-CM | POA: Diagnosis not present

## 2023-11-26 DIAGNOSIS — N1832 Chronic kidney disease, stage 3b: Secondary | ICD-10-CM | POA: Diagnosis not present

## 2023-11-26 DIAGNOSIS — D61818 Other pancytopenia: Secondary | ICD-10-CM | POA: Diagnosis not present

## 2023-11-27 DIAGNOSIS — G2581 Restless legs syndrome: Secondary | ICD-10-CM | POA: Diagnosis not present

## 2023-11-27 DIAGNOSIS — N1832 Chronic kidney disease, stage 3b: Secondary | ICD-10-CM | POA: Diagnosis not present

## 2023-11-27 DIAGNOSIS — D631 Anemia in chronic kidney disease: Secondary | ICD-10-CM | POA: Diagnosis not present

## 2023-11-27 DIAGNOSIS — M5417 Radiculopathy, lumbosacral region: Secondary | ICD-10-CM | POA: Diagnosis not present

## 2023-11-27 DIAGNOSIS — G603 Idiopathic progressive neuropathy: Secondary | ICD-10-CM | POA: Diagnosis not present

## 2023-11-27 DIAGNOSIS — D61818 Other pancytopenia: Secondary | ICD-10-CM | POA: Diagnosis not present

## 2023-11-27 DIAGNOSIS — K2970 Gastritis, unspecified, without bleeding: Secondary | ICD-10-CM | POA: Diagnosis not present

## 2023-11-27 DIAGNOSIS — Z8673 Personal history of transient ischemic attack (TIA), and cerebral infarction without residual deficits: Secondary | ICD-10-CM | POA: Diagnosis not present

## 2023-11-27 DIAGNOSIS — G5603 Carpal tunnel syndrome, bilateral upper limbs: Secondary | ICD-10-CM | POA: Diagnosis not present

## 2023-11-27 DIAGNOSIS — G894 Chronic pain syndrome: Secondary | ICD-10-CM | POA: Diagnosis not present

## 2023-11-27 DIAGNOSIS — M5412 Radiculopathy, cervical region: Secondary | ICD-10-CM | POA: Diagnosis not present

## 2023-11-27 DIAGNOSIS — I129 Hypertensive chronic kidney disease with stage 1 through stage 4 chronic kidney disease, or unspecified chronic kidney disease: Secondary | ICD-10-CM | POA: Diagnosis not present

## 2023-11-27 DIAGNOSIS — G3184 Mild cognitive impairment, so stated: Secondary | ICD-10-CM | POA: Diagnosis not present

## 2023-11-27 DIAGNOSIS — K209 Esophagitis, unspecified without bleeding: Secondary | ICD-10-CM | POA: Diagnosis not present

## 2023-11-28 DIAGNOSIS — Z23 Encounter for immunization: Secondary | ICD-10-CM | POA: Diagnosis not present

## 2023-11-30 DIAGNOSIS — K209 Esophagitis, unspecified without bleeding: Secondary | ICD-10-CM | POA: Diagnosis not present

## 2023-11-30 DIAGNOSIS — K5090 Crohn's disease, unspecified, without complications: Secondary | ICD-10-CM | POA: Diagnosis not present

## 2023-11-30 DIAGNOSIS — E039 Hypothyroidism, unspecified: Secondary | ICD-10-CM | POA: Diagnosis not present

## 2023-11-30 DIAGNOSIS — D631 Anemia in chronic kidney disease: Secondary | ICD-10-CM | POA: Diagnosis not present

## 2023-11-30 DIAGNOSIS — I700 Atherosclerosis of aorta: Secondary | ICD-10-CM | POA: Diagnosis not present

## 2023-11-30 DIAGNOSIS — D509 Iron deficiency anemia, unspecified: Secondary | ICD-10-CM | POA: Diagnosis not present

## 2023-11-30 DIAGNOSIS — K7460 Unspecified cirrhosis of liver: Secondary | ICD-10-CM | POA: Diagnosis not present

## 2023-11-30 DIAGNOSIS — G4733 Obstructive sleep apnea (adult) (pediatric): Secondary | ICD-10-CM | POA: Diagnosis not present

## 2023-11-30 DIAGNOSIS — F39 Unspecified mood [affective] disorder: Secondary | ICD-10-CM | POA: Diagnosis not present

## 2023-11-30 DIAGNOSIS — D61818 Other pancytopenia: Secondary | ICD-10-CM | POA: Diagnosis not present

## 2023-11-30 DIAGNOSIS — Z9181 History of falling: Secondary | ICD-10-CM | POA: Diagnosis not present

## 2023-11-30 DIAGNOSIS — Z7901 Long term (current) use of anticoagulants: Secondary | ICD-10-CM | POA: Diagnosis not present

## 2023-11-30 DIAGNOSIS — Z96641 Presence of right artificial hip joint: Secondary | ICD-10-CM | POA: Diagnosis not present

## 2023-11-30 DIAGNOSIS — Z7951 Long term (current) use of inhaled steroids: Secondary | ICD-10-CM | POA: Diagnosis not present

## 2023-11-30 DIAGNOSIS — Z86711 Personal history of pulmonary embolism: Secondary | ICD-10-CM | POA: Diagnosis not present

## 2023-11-30 DIAGNOSIS — D849 Immunodeficiency, unspecified: Secondary | ICD-10-CM | POA: Diagnosis not present

## 2023-11-30 DIAGNOSIS — I129 Hypertensive chronic kidney disease with stage 1 through stage 4 chronic kidney disease, or unspecified chronic kidney disease: Secondary | ICD-10-CM | POA: Diagnosis not present

## 2023-11-30 DIAGNOSIS — K449 Diaphragmatic hernia without obstruction or gangrene: Secondary | ICD-10-CM | POA: Diagnosis not present

## 2023-11-30 DIAGNOSIS — K2970 Gastritis, unspecified, without bleeding: Secondary | ICD-10-CM | POA: Diagnosis not present

## 2023-11-30 DIAGNOSIS — N1832 Chronic kidney disease, stage 3b: Secondary | ICD-10-CM | POA: Diagnosis not present

## 2023-11-30 DIAGNOSIS — G2581 Restless legs syndrome: Secondary | ICD-10-CM | POA: Diagnosis not present

## 2023-11-30 DIAGNOSIS — M48061 Spinal stenosis, lumbar region without neurogenic claudication: Secondary | ICD-10-CM | POA: Diagnosis not present

## 2023-11-30 DIAGNOSIS — G8929 Other chronic pain: Secondary | ICD-10-CM | POA: Diagnosis not present

## 2023-12-02 DIAGNOSIS — D631 Anemia in chronic kidney disease: Secondary | ICD-10-CM | POA: Diagnosis not present

## 2023-12-02 DIAGNOSIS — K209 Esophagitis, unspecified without bleeding: Secondary | ICD-10-CM | POA: Diagnosis not present

## 2023-12-02 DIAGNOSIS — K2970 Gastritis, unspecified, without bleeding: Secondary | ICD-10-CM | POA: Diagnosis not present

## 2023-12-02 DIAGNOSIS — I129 Hypertensive chronic kidney disease with stage 1 through stage 4 chronic kidney disease, or unspecified chronic kidney disease: Secondary | ICD-10-CM | POA: Diagnosis not present

## 2023-12-02 DIAGNOSIS — N1832 Chronic kidney disease, stage 3b: Secondary | ICD-10-CM | POA: Diagnosis not present

## 2023-12-02 DIAGNOSIS — D61818 Other pancytopenia: Secondary | ICD-10-CM | POA: Diagnosis not present

## 2023-12-03 DIAGNOSIS — D631 Anemia in chronic kidney disease: Secondary | ICD-10-CM | POA: Diagnosis not present

## 2023-12-03 DIAGNOSIS — N1832 Chronic kidney disease, stage 3b: Secondary | ICD-10-CM | POA: Diagnosis not present

## 2023-12-03 DIAGNOSIS — K2970 Gastritis, unspecified, without bleeding: Secondary | ICD-10-CM | POA: Diagnosis not present

## 2023-12-03 DIAGNOSIS — K209 Esophagitis, unspecified without bleeding: Secondary | ICD-10-CM | POA: Diagnosis not present

## 2023-12-03 DIAGNOSIS — I129 Hypertensive chronic kidney disease with stage 1 through stage 4 chronic kidney disease, or unspecified chronic kidney disease: Secondary | ICD-10-CM | POA: Diagnosis not present

## 2023-12-03 DIAGNOSIS — D61818 Other pancytopenia: Secondary | ICD-10-CM | POA: Diagnosis not present

## 2023-12-04 ENCOUNTER — Inpatient Hospital Stay

## 2023-12-04 ENCOUNTER — Encounter (INDEPENDENT_AMBULATORY_CARE_PROVIDER_SITE_OTHER): Payer: Self-pay | Admitting: Gastroenterology

## 2023-12-04 ENCOUNTER — Ambulatory Visit (INDEPENDENT_AMBULATORY_CARE_PROVIDER_SITE_OTHER): Admitting: Gastroenterology

## 2023-12-04 VITALS — BP 142/73 | HR 80 | Temp 97.8°F | Resp 18

## 2023-12-04 VITALS — BP 139/83 | HR 95 | Temp 97.8°F | Ht 62.0 in | Wt 142.4 lb

## 2023-12-04 DIAGNOSIS — K7460 Unspecified cirrhosis of liver: Secondary | ICD-10-CM | POA: Insufficient documentation

## 2023-12-04 DIAGNOSIS — N1832 Chronic kidney disease, stage 3b: Secondary | ICD-10-CM

## 2023-12-04 DIAGNOSIS — K50019 Crohn's disease of small intestine with unspecified complications: Secondary | ICD-10-CM

## 2023-12-04 DIAGNOSIS — I2699 Other pulmonary embolism without acute cor pulmonale: Secondary | ICD-10-CM | POA: Diagnosis not present

## 2023-12-04 DIAGNOSIS — K5010 Crohn's disease of large intestine without complications: Secondary | ICD-10-CM

## 2023-12-04 DIAGNOSIS — R932 Abnormal findings on diagnostic imaging of liver and biliary tract: Secondary | ICD-10-CM

## 2023-12-04 DIAGNOSIS — D509 Iron deficiency anemia, unspecified: Secondary | ICD-10-CM

## 2023-12-04 DIAGNOSIS — K5080 Crohn's disease of both small and large intestine without complications: Secondary | ICD-10-CM

## 2023-12-04 DIAGNOSIS — D72819 Decreased white blood cell count, unspecified: Secondary | ICD-10-CM | POA: Diagnosis not present

## 2023-12-04 DIAGNOSIS — Z79899 Other long term (current) drug therapy: Secondary | ICD-10-CM | POA: Diagnosis not present

## 2023-12-04 DIAGNOSIS — Z9049 Acquired absence of other specified parts of digestive tract: Secondary | ICD-10-CM

## 2023-12-04 DIAGNOSIS — K5090 Crohn's disease, unspecified, without complications: Secondary | ICD-10-CM | POA: Diagnosis not present

## 2023-12-04 HISTORY — DX: Unspecified cirrhosis of liver: K74.60

## 2023-12-04 LAB — CBC
HCT: 30.8 % — ABNORMAL LOW (ref 36.0–46.0)
Hemoglobin: 9.9 g/dL — ABNORMAL LOW (ref 12.0–15.0)
MCH: 33.4 pg (ref 26.0–34.0)
MCHC: 32.1 g/dL (ref 30.0–36.0)
MCV: 104.1 fL — ABNORMAL HIGH (ref 80.0–100.0)
Platelets: 154 10*3/uL (ref 150–400)
RBC: 2.96 MIL/uL — ABNORMAL LOW (ref 3.87–5.11)
RDW: 13.9 % (ref 11.5–15.5)
WBC: 3.5 10*3/uL — ABNORMAL LOW (ref 4.0–10.5)
nRBC: 0 % (ref 0.0–0.2)

## 2023-12-04 MED ORDER — MESALAMINE ER 0.375 G PO CP24: 1500.0000 mg | ORAL_CAPSULE | Freq: Every day | ORAL | 2 refills | Status: DC

## 2023-12-04 MED ORDER — EPOETIN ALFA-EPBX 10000 UNIT/ML IJ SOLN
10000.0000 [IU] | Freq: Once | INTRAMUSCULAR | Status: AC
  Administered 2023-12-04: 10000 [IU] via SUBCUTANEOUS
  Filled 2023-12-04: qty 1

## 2023-12-04 NOTE — Patient Instructions (Signed)

## 2023-12-04 NOTE — Patient Instructions (Signed)
 Read about medications for Crohn's disease (Tremfya, Skyrizi , Stelara , Remicade, Humira) Continue Apriso /mesalamine  for now Will discuss imaging findings of cirrhosis in upcoming appointment

## 2023-12-04 NOTE — Progress Notes (Signed)
 Jessica [name], M.D. Gastroenterology & Hepatology [facility] Gastroenterology [address] [place], [place] [address]  Primary Care Physician: Jessica Bitters, MD [address] [place] [address]  I will communicate my assessment and recommendations to the referring MD via EMR.  Problems: ileocolonic Crohn's s/p right hemicolectomy  History of Present Illness: Jessica [name] is a 73 y.o. 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  The patient was last seen on 08/14/2023. At that time, the patient was scheduled for EGD.  She was started on MiraLAX  daily and was advised to take daily iron.  Also advised to continue pantoprazole  40 mg daily.  She was eventually started on Rinvoq  in February 2025.  Notably, the patient presented an episode of aspiration bronchitis, for which she was given a course of antibiotics.  Patient was admitted to the hospital at [facility] on 10/18/2023 after presenting persistent nausea and vomiting.  Patient was seen by Dr. Honey [name] and Dr. Lavaughn [name].  Underwent EGD with finding described below.  Based on discussion with patient, she wanted to restart Apriso  and stop Rinvoq  given potential side effects from this medication, including pancytopenia.  Patient has been taking Imodium  2 pills every other day and 1 pill every other day, she will have a BM 1-2 Bms per day, stools are soft. No melena and hematochezia. She endorses having some abdominal episodes occasionally, some nausea sometimes.  The patient denies having any nausea, vomiting, fever, chills, hematochezia, melena, hematemesis,  jaundice, pruritus. Has lost close to 20 lb since symptoms started.  Patient is very fearful regarding taking medications for her Crohn's disease as she  wants to avoid having more side effects from medications.  Previous medications: Pentasa , Entyvio , Rinvoq   Last EGD: 10/23/23 The Z- line was found 36 cm from the incisors.  Localized mild inflammation characterized by congestion ( edema) , erosions and erythema was found in the gastric antrum. Biopsies were taken with a cold forceps for histology. Estimated blood loss was minimal.  A medium- sized hiatal hernia was present.  The examined duodenum was normal.  LA Grade A ( one or more mucosal breaks less than 5 mm, not extending between tops of 2 mucosal folds) esophagitis with no bleeding was found at the gastroesophageal junction.  Colonoscopy 04/01/23: -Crohn's disease with ileitis, congestion/edema, erosions, erythema found in the neoterminal ileum Rtugeerts Score i1 -Associated narrowing of the lumen s/p biopsy -Patent end-to-side colocolonic anastomosis with healthy-appearing mucosa -Three 4-12 mm polyps in the transverse colon -Nonbleeding internal hemorrhoids -Advised to check TB and hepatitis B surface antigen in anticipation of treatment with Stelara /Entyvio /Skyrizi  -Pathology with ileal mucosa with focal erosion and active neutrophilic inflammation (NSAID versus Crohn's).  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    Past Medical History: Past Medical History:  Diagnosis Date   Anxiety attack    Arthritis    Back pain, chronic    Chronic kidney disease    Crohn's colitis (HCC)    Depression (emotion)    Dysrhythmia    GERD (gastroesophageal reflux disease)    Hypertension    Hypothyroid    Iron deficiency anemia, unspecified    Nausea and vomiting 10/22/2023   Neuropathy    Sleep apnea    cpap is not working per patient - needs to  find a new doctor - not used since 7/16 per pat    Stage 3 chronic kidney disease (HCC)    Vertigo    followed by Dr Aleatha Hunting- in [place]     Past Surgical History: Past Surgical History:  Procedure Laterality Date    ABDOMINAL HYSTERECTOMY     BIOPSY  06/08/2018   Procedure: BIOPSY;  Surgeon: Jessica Corporal, MD;  [facility];  Service: Endoscopy;;  (colon)   BIOPSY  04/01/2023   Procedure: BIOPSY;  Surgeon: Jessica Garden, MD;  [facility];  Service: Gastroenterology;;   CHOLECYSTECTOMY     COLON SURGERY     for Crohn's in the 80s in [place]    COLONOSCOPY N/A 03/11/2013   Procedure: COLONOSCOPY;  Surgeon: Jessica Corporal, MD;  [facility];  Service: Endoscopy;  Laterality: N/A;  1200   COLONOSCOPY N/A 06/08/2018   Procedure: COLONOSCOPY;  Surgeon: Jessica Corporal, MD;  [facility];  Service: Endoscopy;  Laterality: N/A;  8:25   COLONOSCOPY WITH PROPOFOL  N/A 04/01/2023   Procedure: COLONOSCOPY WITH PROPOFOL ;  Surgeon: Jessica Garden, MD;  [facility];  Service: Gastroenterology;  Laterality: N/A;   ESOPHAGEAL BRUSHING  04/01/2023   Procedure: ESOPHAGEAL BRUSHING;  Surgeon: Jessica Garden, MD;  [facility];  Service: Gastroenterology;;   ESOPHAGOGASTRODUODENOSCOPY N/A 10/07/2023   Procedure: EGD (ESOPHAGOGASTRODUODENOSCOPY);  Surgeon: Jessica [name], Jessica Limes, MD;  [facility];  Service: Gastroenterology;  Laterality: N/A;  215[PM, ASA 3, pt knows to arrive at 6:15   ESOPHAGOGASTRODUODENOSCOPY N/A 10/23/2023   Procedure: EGD (ESOPHAGOGASTRODUODENOSCOPY);  Surgeon: Jessica Bonds, MD;  [facility];  Service: Gastroenterology;  Laterality: N/A;   ESOPHAGOGASTRODUODENOSCOPY (EGD) WITH PROPOFOL  N/A 04/01/2023   Procedure: ESOPHAGOGASTRODUODENOSCOPY (EGD) WITH PROPOFOL ;  Surgeon: Jessica Garden, MD;  [facility];  Service: Gastroenterology;  Laterality: N/A;  1:00am;asa 3   fatty tumor removed     HARDWARE REMOVAL Right 08/07/2016   Procedure: HARDWARE REMOVAL;  Surgeon: Liliane Rei, MD;  [facility]: [facility];  Service: Orthopedics;  Laterality: Right;   Hip  replacement      Rt hip in 2010 in [place]   KNEE ARTHROSCOPY Left 03/29/2015   Procedure: ARTHROSCOPY LEFT KNEE WITH MENSICAL DEBRIDEMENT, chondroplasty;  Surgeon: Liliane Rei, MD;  [facility]: [facility];  Service: Orthopedics;  Laterality: Left;   ORIF TIBIA PLATEAU Right 02/29/2016   Procedure: OPEN REDUCTION INTERNAL FIXATION (ORIF RIGHT  TIBIAL PLATEAU FRACTURE;  Surgeon: Liliane Rei, MD;  [facility];  Service: Orthopedics;  Laterality: Right;   POLYPECTOMY  04/01/2023   Procedure: POLYPECTOMY;  Surgeon: Jessica Garden, MD;  [facility];  Service: Gastroenterology;;   TEE WITHOUT CARDIOVERSION N/A 03/27/2021   Procedure: TRANSESOPHAGEAL ECHOCARDIOGRAM (TEE);  Surgeon: Laurann Pollock, MD;  [facility];  Service: Endoscopy;  Laterality: N/A;   TOTAL KNEE ARTHROPLASTY Right 08/25/2017   Procedure: TOTAL RIGHT KNEE ARTHROPLASTY;  Surgeon: Liliane Rei, MD;  [facility];  Service: Orthopedics;  Laterality: Right;    Family History: Family History  Problem Relation Age of Onset   Crohn's disease Brother    Depression Mother    Anxiety disorder Mother    Depression Sister    Aneurysm Father    Colon cancer Neg Hx     Social History: Social History   Tobacco Use  Smoking Status Never  Smokeless Tobacco Never   Social History   Substance  and Sexual Activity  Alcohol  Use No   Alcohol /week: 0.0 standard drinks of alcohol    Social History   Substance and Sexual Activity  Drug Use No    Allergies: Allergies  Allergen Reactions   Robaxin  [Methocarbamol ] Itching   Sulfa Antibiotics Itching    ALL SULFA DRUGS    Medications: Current Outpatient Medications  Medication Sig Dispense Refill   acetaminophen  (TYLENOL ) 325 MG tablet Take 2 tablets (650 mg total) by mouth every 6 (six) hours as needed for mild pain (pain score 1-3), fever or headache (or Fever >/= 101).     Albuterol -Budesonide (AIRSUPRA ) 90-80 MCG/ACT AERO Inhale  2 puffs into the lungs every 8 (eight) hours. (Patient taking differently: Inhale 2 puffs into the lungs daily as needed (SOB).)     apixaban  (ELIQUIS ) 5 MG TABS tablet Take 1 tablet (5 mg total) by mouth 2 (two) times daily. 60 tablet 4   Azelastine HCl 137 MCG/SPRAY SOLN Place 2 sprays into both nostrils 2 (two) times daily as needed (Congestion/ Allergies).     buPROPion  (WELLBUTRIN  XL) 150 MG 24 hr tablet Take 1 tablet (150 mg total) by mouth every morning. 30 tablet 3   calcitRIOL (ROCALTROL) 0.25 MCG capsule Take 0.25 mcg by mouth daily.     cyanocobalamin  (VITAMIN B12) 1000 MCG/ML injection Inject 1,000 mcg into the muscle once a week.     dicyclomine  (BENTYL ) 10 MG capsule TAKE 1 CAPSULE BY MOUTH TWICE DAILY BEFORE A MEAL 60 capsule 0   ferrous sulfate  325 (65 FE) MG EC tablet Take 1 tablet (325 mg total) by mouth daily with breakfast. 90 tablet 3   levothyroxine  (SYNTHROID ) 100 MCG tablet Take 100 mcg by mouth daily.     mesalamine  (APRISO ) 0.375 g 24 hr capsule Take 1 capsule (0.375 g total) by mouth daily. 30 capsule 2   Multiple Vitamin (MULTIVITAMIN WITH MINERALS) TABS tablet Take 1 tablet by mouth daily.     ondansetron  (ZOFRAN -ODT) 4 MG disintegrating tablet Take 1 tablet (4 mg total) by mouth every 8 (eight) hours as needed. 14 tablet 0   pantoprazole  (PROTONIX ) 40 MG tablet Take 1 tablet (40 mg total) by mouth daily. 30 tablet 1   rOPINIRole  (REQUIP ) 1 MG tablet Take 3 mg by mouth at bedtime.     thiamine (VITAMIN B-1) 100 MG tablet Take 100 mg by mouth daily.     sodium bicarbonate 650 MG tablet Take 650 mg by mouth 2 (two) times daily.     No current facility-administered medications for this visit.    Review of Systems: GENERAL: negative for malaise, night sweats HEENT: No changes in hearing or vision, no nose bleeds or other nasal problems. NECK: Negative for lumps, goiter, pain and significant neck swelling RESPIRATORY: Negative for cough, wheezing CARDIOVASCULAR:  Negative for chest pain, leg swelling, palpitations, orthopnea GI: SEE HPI MUSCULOSKELETAL: Negative for joint pain or swelling, back pain, and muscle pain. SKIN: Negative for lesions, rash PSYCH: Negative for sleep disturbance, mood disorder and recent psychosocial stressors. HEMATOLOGY Negative for prolonged bleeding, bruising easily, and swollen nodes. ENDOCRINE: Negative for cold or heat intolerance, polyuria, polydipsia and goiter. NEURO: negative for tremor, gait imbalance, syncope and seizures. The remainder of the review of systems is noncontributory.   Physical Exam: BP 139/83 (BP Location: Left Arm, Patient Position: Sitting, Cuff Size: Normal)   Pulse 95   Temp 97.8 F (36.6 C) (Temporal)   Ht 5\' 2"  (1.575 m)   Wt 142 lb 6.4 oz (  64.6 kg)   BMI 26.05 kg/m  GENERAL: The patient is AO x3, in no acute distress. HEENT: Head is normocephalic and atraumatic. EOMI are intact. Mouth is well hydrated and without lesions. NECK: Supple. No masses LUNGS: Clear to auscultation. No presence of rhonchi/wheezing/rales. Adequate chest expansion HEART: RRR, normal s1 and s2. ABDOMEN: Soft, nontender, no guarding, no peritoneal signs, and nondistended. BS +. No masses. EXTREMITIES: Without any cyanosis, clubbing, rash, lesions or edema. NEUROLOGIC: AOx3, no focal motor deficit. SKIN: no jaundice, no rashes  Imaging/Labs: as above  I personally reviewed and interpreted the available labs, imaging and endoscopic files.  Impression and Plan: CAEDENCE SNOWDEN is a 73 y.o. 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  Patient had evidence of recurrent disease in her neoterminal ileum based on most recent endoscopic evaluation.  She has tried different biologicals including Entyvio  and Rinvoq   but has not tolerated these medications.  In fact she presented significant pancytopenia with Rinvoq  and this was discontinued.  We extensively discussed for an hour the importance of treating her disease to avoid further complications given the presence of recurrent disease.  She understands this and will read about the other possible medications she can try.  We will discuss which medication she wants to try in her next appointment.  Notably, had some imaging findings that were concerning for cirrhosis in previous imaging.  We will discuss this further in next appointment.  -Read about medications for Crohn's disease (Tremfya, Skyrizi , Stelara , Remicade, Humira) -Continue Apriso /mesalamine  for now -Will discuss imaging findings of cirrhosis in upcoming appointment  All questions were answered.      Jessica Cress, MD Gastroenterology and Hepatology [facility]

## 2023-12-04 NOTE — Progress Notes (Signed)
 Patient tolerated injection with no complaints voiced.  Site clean and dry with no bruising or swelling noted at site.  See MAR for details.  Band aid applied.  Patient stable during and after injection.  Vss with discharge and left in satisfactory condition with no s/s of distress noted.

## 2023-12-05 ENCOUNTER — Ambulatory Visit: Payer: Medicare Other | Admitting: Cardiology

## 2023-12-05 DIAGNOSIS — D61818 Other pancytopenia: Secondary | ICD-10-CM | POA: Diagnosis not present

## 2023-12-05 DIAGNOSIS — K209 Esophagitis, unspecified without bleeding: Secondary | ICD-10-CM | POA: Diagnosis not present

## 2023-12-05 DIAGNOSIS — I129 Hypertensive chronic kidney disease with stage 1 through stage 4 chronic kidney disease, or unspecified chronic kidney disease: Secondary | ICD-10-CM | POA: Diagnosis not present

## 2023-12-05 DIAGNOSIS — K2970 Gastritis, unspecified, without bleeding: Secondary | ICD-10-CM | POA: Diagnosis not present

## 2023-12-05 DIAGNOSIS — D631 Anemia in chronic kidney disease: Secondary | ICD-10-CM | POA: Diagnosis not present

## 2023-12-05 DIAGNOSIS — N1832 Chronic kidney disease, stage 3b: Secondary | ICD-10-CM | POA: Diagnosis not present

## 2023-12-05 NOTE — Progress Notes (Deleted)
 Clinical Summary Ms. Jessica [name] is a 73 y.o.female  seen today for follow up of the following meidcal problems.    1.Palpitations - can occur at rest or with activity - feeling of heart pounding. No other associated symptoms. Symptoms on and off throughout the day. - no coffee, rare sodas, occasional tea, no energy drinks, no EtOH - recent family stress   11/2021 3 day monitor: rare PACs, one runs SVT x 8 beats. Occasoinal PVCs (1.3%).  - some racing at times, about 2-3 times per week. Lasts just a few seconds. Overall tolerable     2. Mitral regurgitation - severe MR from pcp note   02/2021 echo at [facility]: LVEF >55%, severe MR. LVIDs 2.6  03/2021 TEE: moderate MR 12/2020 TSH 8.96 Hgb 13 Plt 169 TSH 8.96     12/2021 echo: LVEF 55-60%, grade I dd, normal RV, mild to mod MR,  Jan 2025 echo: LVEF 55-60%, no WMAs, normal diastolic, normal RV, mild MR   3. DOE - DOE with walking just in from parking lot - no chest pains - some recent LE edema x 3-4 months progressing - had been chlothalidone nephrology stopped. Taking aldactone 25mg  daily.    03/2022 nuclear stress: no ischemia   4.HTN - neprhology stopped thiaizde diuretic, started hydralazine - she is compliant with meds - home bp's 110s/60s         5.OSA - followed by Dr Jessica [name] - mixed compliance with cpap, has f/u next week     5. CKD - followed by Dr Jessica [name]   6.Hypothyroidism - last TSH was elevated from 12/2020, perhaps playing a role in her fatigue     7. Chronic fatigue     8. PE - diagnosed Jan 2025 - followed by heme/onc.   9. Pancytopenia - recent admit 10/2023, febrile illness and pancytopenia. -   10. Crohn's disease    Past Medical History:  Diagnosis Date   Anxiety attack    Arthritis    Back pain, chronic    Chronic kidney disease    Crohn's colitis (HCC)    Depression (emotion)    Dysrhythmia    GERD (gastroesophageal reflux disease)    Hypertension    Hypothyroid     Iron deficiency anemia, unspecified    Nausea and vomiting 10/22/2023   Neuropathy    Sleep apnea    cpap is not working per patient - needs to find a new doctor - not used since 7/16 per pat    Stage 3 chronic kidney disease (HCC)    Vertigo    followed by Dr Jessica [name]- in [place]      Allergies  Allergen Reactions   Robaxin  [Methocarbamol ] Itching   Sulfa Antibiotics Itching    ALL SULFA DRUGS     Current Outpatient Medications  Medication Sig Dispense Refill   acetaminophen  (TYLENOL ) 325 MG tablet Take 2 tablets (650 mg total) by mouth every 6 (six) hours as needed for mild pain (pain score 1-3), fever or headache (or Fever >/= 101).     Albuterol -Budesonide (AIRSUPRA ) 90-80 MCG/ACT AERO Inhale 2 puffs into the lungs every 8 (eight) hours. (Patient taking differently: Inhale 2 puffs into the lungs daily as needed (SOB).)     apixaban  (ELIQUIS ) 5 MG TABS tablet Take 1 tablet (5 mg total) by mouth 2 (two) times daily. 60 tablet 4   Azelastine HCl 137 MCG/SPRAY SOLN Place 2 sprays into both nostrils 2 (two) times daily  as needed (Congestion/ Allergies).     buPROPion  (WELLBUTRIN  XL) 150 MG 24 hr tablet Take 1 tablet (150 mg total) by mouth every morning. 30 tablet 3   calcitRIOL (ROCALTROL) 0.25 MCG capsule Take 0.25 mcg by mouth daily.     cyanocobalamin  (VITAMIN B12) 1000 MCG/ML injection Inject 1,000 mcg into the muscle once a week.     dicyclomine  (BENTYL ) 10 MG capsule TAKE 1 CAPSULE BY MOUTH TWICE DAILY BEFORE A MEAL 60 capsule 0   ferrous sulfate  325 (65 FE) MG EC tablet Take 1 tablet (325 mg total) by mouth daily with breakfast. 90 tablet 3   levothyroxine  (SYNTHROID ) 100 MCG tablet Take 100 mcg by mouth daily.     mesalamine  (APRISO ) 0.375 g 24 hr capsule Take 4 capsules (1.5 g total) by mouth daily. 120 capsule 2   Multiple Vitamin (MULTIVITAMIN WITH MINERALS) TABS tablet Take 1 tablet by mouth daily.     ondansetron  (ZOFRAN -ODT) 4 MG disintegrating tablet Take 1 tablet (4 mg  total) by mouth every 8 (eight) hours as needed. 14 tablet 0   pantoprazole  (PROTONIX ) 40 MG tablet Take 1 tablet (40 mg total) by mouth daily. 30 tablet 1   rOPINIRole  (REQUIP ) 1 MG tablet Take 3 mg by mouth at bedtime.     sodium bicarbonate 650 MG tablet Take 650 mg by mouth 2 (two) times daily.     thiamine (VITAMIN B-1) 100 MG tablet Take 100 mg by mouth daily.     No current facility-administered medications for this visit.     Past Surgical History:  Procedure Laterality Date   ABDOMINAL HYSTERECTOMY     BIOPSY  06/08/2018   Procedure: BIOPSY;  Surgeon: Jessica Corporal, MD;  [facility];  Service: Endoscopy;;  (colon)   BIOPSY  04/01/2023   Procedure: BIOPSY;  Surgeon: Jessica Garden, MD;  [facility]: [facility];  Service: Gastroenterology;;   CHOLECYSTECTOMY     COLON SURGERY     for Crohn's in the 80s in [place]    COLONOSCOPY N/A 03/11/2013   Procedure: COLONOSCOPY;  Surgeon: Jessica Corporal, MD;  [facility];  Service: Endoscopy;  Laterality: N/A;  1200   COLONOSCOPY N/A 06/08/2018   Procedure: COLONOSCOPY;  Surgeon: Jessica Corporal, MD;  [facility];  Service: Endoscopy;  Laterality: N/A;  8:25   COLONOSCOPY WITH PROPOFOL  N/A 04/01/2023   Procedure: COLONOSCOPY WITH PROPOFOL ;  Surgeon: Jessica Garden, MD;  [facility];  Service: Gastroenterology;  Laterality: N/A;   ESOPHAGEAL BRUSHING  04/01/2023   Procedure: ESOPHAGEAL BRUSHING;  Surgeon: Jessica Garden, MD;  [facility];  Service: Gastroenterology;;   ESOPHAGOGASTRODUODENOSCOPY N/A 10/07/2023   Procedure: EGD (ESOPHAGOGASTRODUODENOSCOPY);  Surgeon: Jessica [name], Jessica Limes, MD;  [facility];  Service: Gastroenterology;  Laterality: N/A;  215[PM, ASA 3, pt knows to arrive at 6:15   ESOPHAGOGASTRODUODENOSCOPY N/A 10/23/2023   Procedure: EGD (ESOPHAGOGASTRODUODENOSCOPY);  Surgeon: Jessica Bonds, MD;  [name];  Service: Gastroenterology;  Laterality: N/A;   ESOPHAGOGASTRODUODENOSCOPY (EGD) WITH PROPOFOL  N/A 04/01/2023   Procedure: ESOPHAGOGASTRODUODENOSCOPY (EGD) WITH PROPOFOL ;  Surgeon: Jessica Garden, MD;  [facility];  Service: Gastroenterology;  Laterality: N/A;  1:00am;asa 3   fatty tumor removed     HARDWARE REMOVAL Right 08/07/2016   Procedure: HARDWARE REMOVAL;  Surgeon: Liliane Rei, MD;  Location: [facility];  Service: Orthopedics;  Laterality: Right;   Hip replacement  Rt hip in 2010 in [place]   KNEE ARTHROSCOPY Left 03/29/2015   Procedure: ARTHROSCOPY LEFT KNEE WITH MENSICAL DEBRIDEMENT, chondroplasty;  Surgeon: Liliane Rei, MD;  Location: [facility];  Service: Orthopedics;  Laterality: Left;   ORIF TIBIA PLATEAU Right 02/29/2016   Procedure: OPEN REDUCTION INTERNAL FIXATION (ORIF RIGHT  TIBIAL PLATEAU FRACTURE;  Surgeon: Liliane Rei, MD;  Location: [facility];  Service: Orthopedics;  Laterality: Right;   POLYPECTOMY  04/01/2023   Procedure: POLYPECTOMY;  Surgeon: Jessica Garden, MD;  [facility];  Service: Gastroenterology;;   TEE WITHOUT CARDIOVERSION N/A 03/27/2021   Procedure: TRANSESOPHAGEAL ECHOCARDIOGRAM (TEE);  Surgeon: Laurann Pollock, MD;  [facility];  Service: Endoscopy;  Laterality: N/A;   TOTAL KNEE ARTHROPLASTY Right 08/25/2017   Procedure: TOTAL RIGHT KNEE ARTHROPLASTY;  Surgeon: Liliane Rei, MD;  Location: [facility];  Service: Orthopedics;  Laterality: Right;     Allergies  Allergen Reactions   Robaxin  [Methocarbamol ] Itching   Sulfa Antibiotics Itching    ALL SULFA DRUGS      Family History  Problem Relation Age of Onset   Crohn's disease Brother    Depression Mother    Anxiety disorder Mother    Depression Sister    Aneurysm Father    Colon cancer Neg Hx      Social History Ms. Ligman reports that she has never smoked. She has never used smokeless tobacco. Ms. Ting reports no  history of alcohol  use.   Review of Systems CONSTITUTIONAL: No weight loss, fever, chills, weakness or fatigue.  HEENT: Eyes: No visual loss, blurred vision, double vision or yellow sclerae.No hearing loss, sneezing, congestion, runny nose or sore throat.  SKIN: No rash or itching.  CARDIOVASCULAR:  RESPIRATORY: No shortness of breath, cough or sputum.  GASTROINTESTINAL: No anorexia, nausea, vomiting or diarrhea. No abdominal pain or blood.  GENITOURINARY: No burning on urination, no polyuria NEUROLOGICAL: No headache, dizziness, syncope, paralysis, ataxia, numbness or tingling in the extremities. No change in bowel or bladder control.  MUSCULOSKELETAL: No muscle, back pain, joint pain or stiffness.  LYMPHATICS: No enlarged nodes. No history of splenectomy.  PSYCHIATRIC: No history of depression or anxiety.  ENDOCRINOLOGIC: No reports of sweating, cold or heat intolerance. No polyuria or polydipsia.  Aaron Aas   Physical Examination There were no vitals filed for this visit. There were no vitals filed for this visit.  Gen: resting comfortably, no acute distress HEENT: no scleral icterus, pupils equal round and reactive, no palptable cervical adenopathy,  CV Resp: Clear to auscultation bilaterally GI: abdomen is soft, non-tender, non-distended, normal bowel sounds, no hepatosplenomegaly MSK: extremities are warm, no edema.  Skin: warm, no rash Neuro:  no focal deficits Psych: appropriate affect   Diagnostic Studies  03/2021 echo IMPRESSIONS     1. Left ventricular ejection fraction, by estimation, is 60 to 65%. The  left ventricle has normal function. The left ventricle has no regional  wall motion abnormalities.   2. Right ventricular systolic function is normal. The right ventricular  size is normal.   3. No left atrial/left atrial appendage thrombus was detected. The LAA  emptying velocity was 54 cm/s.   4. The MR vena contracta is 0.4 cm. . The mitral valve is abnormal.   Moderate mitral valve regurgitation. No evidence of mitral stenosis.   5. The aortic valve is tricuspid. Aortic valve regurgitation is not  visualized. No aortic stenosis is present.      11/2021 monitor 3 day monitor  Rare supraventricular ectopy in the form of isolated PACs, couplets, triplets. One run of SVT 8 beats. Occasional ventricular ectopy in the form of isolated PVCs, couplets No reported symptoms     03/2022 nuclear stress The study is normal. The study is low risk.   No ST deviation was noted.   LV perfusion is normal. Small apical defect in resting and stress images with normal wall motion consistent with apical thinning.   Left ventricular function is normal. End diastolic cavity size is normal.   Jan 2025 echo 1. Left ventricular ejection fraction, by estimation, is 55 to 60%. The  left ventricle has normal function. The left ventricle has no regional  wall motion abnormalities. Left ventricular diastolic parameters were  normal.   2. Right ventricular systolic function is normal. The right ventricular  size is normal.   3. The mitral valve is abnormal. Mild mitral valve regurgitation. No  evidence of mitral stenosis.   4. The aortic valve is tricuspid. There is moderate calcification of the  aortic valve. There is moderate thickening of the aortic valve. Aortic  valve regurgitation is not visualized. No aortic stenosis is present.   5. The inferior vena cava is normal in size with greater than 50%  respiratory variability, suggesting right atrial pressure of 3 mmHg.     Assessment and Plan  1.Palpitations -heart monitor with primarily benign ectopy. - mild symptoms at times are tolerable, continue toprol .    2. Mitral regurgitation - 12/2021 echo mild to moderate MR, not clinically significant. - we will repeat echo later this year after next visit.    3. LE edema - AKI when on diuretic before, neprhology stopped - suspect related to venous insufficiency  due to age, weight, prior leg surgeries -no further cardiac workup indicated   4. DOE - unclear etiology - benign echo and stress test, no further cardiac testing is planned     5. Preoperative evaluation - considering knee replacement.  - benign echo and stress testing, recommend proceeding from cardiac standpoint      Laurann Pollock, M.D., F.A.C.C.

## 2023-12-06 DIAGNOSIS — M542 Cervicalgia: Secondary | ICD-10-CM | POA: Diagnosis not present

## 2023-12-06 DIAGNOSIS — E663 Overweight: Secondary | ICD-10-CM | POA: Diagnosis not present

## 2023-12-06 DIAGNOSIS — Z6825 Body mass index (BMI) 25.0-25.9, adult: Secondary | ICD-10-CM | POA: Diagnosis not present

## 2023-12-06 DIAGNOSIS — R030 Elevated blood-pressure reading, without diagnosis of hypertension: Secondary | ICD-10-CM | POA: Diagnosis not present

## 2023-12-06 DIAGNOSIS — I10 Essential (primary) hypertension: Secondary | ICD-10-CM | POA: Diagnosis not present

## 2023-12-09 DIAGNOSIS — D61818 Other pancytopenia: Secondary | ICD-10-CM | POA: Diagnosis not present

## 2023-12-09 DIAGNOSIS — K209 Esophagitis, unspecified without bleeding: Secondary | ICD-10-CM | POA: Diagnosis not present

## 2023-12-09 DIAGNOSIS — K2970 Gastritis, unspecified, without bleeding: Secondary | ICD-10-CM | POA: Diagnosis not present

## 2023-12-09 DIAGNOSIS — Z299 Encounter for prophylactic measures, unspecified: Secondary | ICD-10-CM | POA: Diagnosis not present

## 2023-12-09 DIAGNOSIS — I10 Essential (primary) hypertension: Secondary | ICD-10-CM | POA: Diagnosis not present

## 2023-12-09 DIAGNOSIS — M542 Cervicalgia: Secondary | ICD-10-CM | POA: Diagnosis not present

## 2023-12-09 DIAGNOSIS — N1831 Chronic kidney disease, stage 3a: Secondary | ICD-10-CM | POA: Diagnosis not present

## 2023-12-09 DIAGNOSIS — R52 Pain, unspecified: Secondary | ICD-10-CM | POA: Diagnosis not present

## 2023-12-09 DIAGNOSIS — I129 Hypertensive chronic kidney disease with stage 1 through stage 4 chronic kidney disease, or unspecified chronic kidney disease: Secondary | ICD-10-CM | POA: Diagnosis not present

## 2023-12-09 DIAGNOSIS — D631 Anemia in chronic kidney disease: Secondary | ICD-10-CM | POA: Diagnosis not present

## 2023-12-09 DIAGNOSIS — N1832 Chronic kidney disease, stage 3b: Secondary | ICD-10-CM | POA: Diagnosis not present

## 2023-12-11 DIAGNOSIS — D631 Anemia in chronic kidney disease: Secondary | ICD-10-CM | POA: Diagnosis not present

## 2023-12-11 DIAGNOSIS — K2970 Gastritis, unspecified, without bleeding: Secondary | ICD-10-CM | POA: Diagnosis not present

## 2023-12-11 DIAGNOSIS — N1832 Chronic kidney disease, stage 3b: Secondary | ICD-10-CM | POA: Diagnosis not present

## 2023-12-11 DIAGNOSIS — K209 Esophagitis, unspecified without bleeding: Secondary | ICD-10-CM | POA: Diagnosis not present

## 2023-12-11 DIAGNOSIS — D61818 Other pancytopenia: Secondary | ICD-10-CM | POA: Diagnosis not present

## 2023-12-11 DIAGNOSIS — I129 Hypertensive chronic kidney disease with stage 1 through stage 4 chronic kidney disease, or unspecified chronic kidney disease: Secondary | ICD-10-CM | POA: Diagnosis not present

## 2023-12-15 DIAGNOSIS — Z79899 Other long term (current) drug therapy: Secondary | ICD-10-CM | POA: Diagnosis not present

## 2023-12-15 DIAGNOSIS — E7800 Pure hypercholesterolemia, unspecified: Secondary | ICD-10-CM | POA: Diagnosis not present

## 2023-12-15 DIAGNOSIS — R809 Proteinuria, unspecified: Secondary | ICD-10-CM | POA: Diagnosis not present

## 2023-12-15 DIAGNOSIS — R5383 Other fatigue: Secondary | ICD-10-CM | POA: Diagnosis not present

## 2023-12-15 DIAGNOSIS — N189 Chronic kidney disease, unspecified: Secondary | ICD-10-CM | POA: Diagnosis not present

## 2023-12-15 DIAGNOSIS — D631 Anemia in chronic kidney disease: Secondary | ICD-10-CM | POA: Diagnosis not present

## 2023-12-16 DIAGNOSIS — N1832 Chronic kidney disease, stage 3b: Secondary | ICD-10-CM | POA: Diagnosis not present

## 2023-12-16 DIAGNOSIS — D61818 Other pancytopenia: Secondary | ICD-10-CM | POA: Diagnosis not present

## 2023-12-16 DIAGNOSIS — I129 Hypertensive chronic kidney disease with stage 1 through stage 4 chronic kidney disease, or unspecified chronic kidney disease: Secondary | ICD-10-CM | POA: Diagnosis not present

## 2023-12-16 DIAGNOSIS — D631 Anemia in chronic kidney disease: Secondary | ICD-10-CM | POA: Diagnosis not present

## 2023-12-16 DIAGNOSIS — K209 Esophagitis, unspecified without bleeding: Secondary | ICD-10-CM | POA: Diagnosis not present

## 2023-12-16 DIAGNOSIS — K2970 Gastritis, unspecified, without bleeding: Secondary | ICD-10-CM | POA: Diagnosis not present

## 2023-12-17 ENCOUNTER — Other Ambulatory Visit: Payer: Self-pay

## 2023-12-17 ENCOUNTER — Emergency Department (HOSPITAL_COMMUNITY)

## 2023-12-17 ENCOUNTER — Other Ambulatory Visit: Payer: Self-pay | Admitting: Gastroenterology

## 2023-12-17 ENCOUNTER — Encounter (HOSPITAL_COMMUNITY): Payer: Self-pay

## 2023-12-17 ENCOUNTER — Telehealth (HOSPITAL_COMMUNITY): Payer: Self-pay

## 2023-12-17 ENCOUNTER — Emergency Department (HOSPITAL_COMMUNITY)
Admission: EM | Admit: 2023-12-17 | Discharge: 2023-12-17 | Disposition: A | Discharge status: Home / Self Care | Attending: Emergency Medicine | Admitting: Emergency Medicine

## 2023-12-17 DIAGNOSIS — M47812 Spondylosis without myelopathy or radiculopathy, cervical region: Secondary | ICD-10-CM | POA: Diagnosis not present

## 2023-12-17 DIAGNOSIS — M542 Cervicalgia: Secondary | ICD-10-CM

## 2023-12-17 DIAGNOSIS — X58XXXA Exposure to other specified factors, initial encounter: Secondary | ICD-10-CM | POA: Insufficient documentation

## 2023-12-17 DIAGNOSIS — S161XXA Strain of muscle, fascia and tendon at neck level, initial encounter: Secondary | ICD-10-CM | POA: Diagnosis not present

## 2023-12-17 DIAGNOSIS — Z7901 Long term (current) use of anticoagulants: Secondary | ICD-10-CM | POA: Diagnosis not present

## 2023-12-17 DIAGNOSIS — M438X2 Other specified deforming dorsopathies, cervical region: Secondary | ICD-10-CM | POA: Diagnosis not present

## 2023-12-17 MED ORDER — GABAPENTIN 100 MG PO CAPS: 100.0000 mg | ORAL_CAPSULE | Freq: Three times a day (TID) | ORAL | 0 refills | Status: DC | PRN

## 2023-12-17 MED ORDER — FENTANYL CITRATE PF 50 MCG/ML IJ SOSY
50.0000 ug | PREFILLED_SYRINGE | Freq: Once | INTRAMUSCULAR | Status: AC
  Administered 2023-12-17: 50 ug via INTRAMUSCULAR
  Filled 2023-12-17: qty 1

## 2023-12-17 MED ORDER — DEXAMETHASONE SODIUM PHOSPHATE 10 MG/ML IJ SOLN
8.0000 mg | Freq: Once | INTRAMUSCULAR | Status: AC
  Administered 2023-12-17: 8 mg via INTRAMUSCULAR
  Filled 2023-12-17: qty 1

## 2023-12-17 NOTE — Telephone Encounter (Signed)
 Scheduled 12/23/23.

## 2023-12-17 NOTE — Telephone Encounter (Signed)
 Pt called in inquiring about if she can increase her buPROPion  (WELLBUTRIN  XL) 150 MG 24 hr tablet states that she is having some health issues and it's getting on her nerves. Pt is scheduled 02/14/24. Please advise.

## 2023-12-17 NOTE — ED Triage Notes (Signed)
 Pt arrived via POV from home c/o right side neck pain X 1 week. Pt reports seeing PCP for this w/o getting help or relief.

## 2023-12-17 NOTE — ED Provider Notes (Signed)
 [place] EMERGENCY DEPARTMENT AT [facility] Provider Note   CSN: [id number] Arrival date & time: 12/17/23  8469     History  Chief Complaint  Patient presents with   Neck Pain    Jessica [name] is a 73 y.o. female present emerged part with acute on chronic neck pain.  Patient reports that she has a crick in the right lower aspect of her neck that is bothering her for 5 days, preventing her to sleep.  It radiates towards her right shoulder.  It is worse with her movement.  Her physical therapist rubbed her neck and it seemed to help a little bit because there was a knot in my neck.  But the pain is back.  She is on anticoagulation so she cannot have NSAIDs.  She uses Norco at night.  In the past she says she has seen a neurologist it was then localized injections into her neck's which sometimes helped for a day or 2.  HPI     Home Medications Prior to Admission medications   Medication Sig Start Date End Date Taking? Authorizing Provider  gabapentin  (NEURONTIN ) 100 MG capsule Take 1 capsule (100 mg total) by mouth 3 (three) times daily as needed for up to 21 doses. 12/17/23  Yes Terena Bohan, Janalyn Me, MD  acetaminophen  (TYLENOL ) 325 MG tablet Take 2 tablets (650 mg total) by mouth every 6 (six) hours as needed for mild pain (pain score 1-3), fever or headache (or Fever >/= 101). 07/16/23   Colin Dawley, MD  Albuterol -Budesonide (AIRSUPRA ) 90-80 MCG/ACT AERO Inhale 2 puffs into the lungs every 8 (eight) hours. Patient taking differently: Inhale 2 puffs into the lungs daily as needed (SOB). 07/22/23   Lind Repine, MD  apixaban  (ELIQUIS ) 5 MG TABS tablet Take 1 tablet (5 mg total) by mouth 2 (two) times daily. 08/13/23   Colin Dawley, MD  Azelastine HCl 137 MCG/SPRAY SOLN Place 2 sprays into both nostrils 2 (two) times daily as needed (Congestion/ Allergies). 07/17/23   [provider]  buPROPion  (WELLBUTRIN  XL) 150 MG 24 hr tablet Take 1 tablet (150 mg total) by  mouth every morning. 11/14/23   Alysia Bachelor, MD  calcitRIOL (ROCALTROL) 0.25 MCG capsule Take 0.25 mcg by mouth daily. 10/26/23   [provider]  cyanocobalamin  (VITAMIN B12) 1000 MCG/ML injection Inject 1,000 mcg into the muscle once a week.    [provider]  dicyclomine  (BENTYL ) 10 MG capsule TAKE 1 CAPSULE BY MOUTH TWICE DAILY BEFORE A MEAL 12/17/23   Umberto Ganong, Bearl Limes, MD  ferrous sulfate  325 (65 FE) MG EC tablet Take 1 tablet (325 mg total) by mouth daily with breakfast. 07/16/23   Colin Dawley, MD  levothyroxine  (SYNTHROID ) 100 MCG tablet Take 100 mcg by mouth daily. 01/09/22   [provider]  mesalamine  (APRISO ) 0.375 g 24 hr capsule Take 4 capsules (1.5 g total) by mouth daily. 12/04/23   Castaneda Mayorga, Daniel, MD  Multiple Vitamin (MULTIVITAMIN WITH MINERALS) TABS tablet Take 1 tablet by mouth daily.    [provider]  ondansetron  (ZOFRAN -ODT) 4 MG disintegrating tablet Take 1 tablet (4 mg total) by mouth every 8 (eight) hours as needed. 10/25/23   Barbee Lew, MD  pantoprazole  (PROTONIX ) 40 MG tablet Take 1 tablet (40 mg total) by mouth daily. 10/26/23   Barbee Lew, MD  rOPINIRole  (REQUIP ) 1 MG tablet Take 3 mg by mouth at bedtime. 03/18/23   [provider]  sodium  bicarbonate 650 MG tablet Take 650 mg by mouth 2 (two) times daily.    [provider]  thiamine (VITAMIN B-1) 100 MG tablet Take 100 mg by mouth daily.    [provider]      Allergies    Robaxin  [methocarbamol ] and Sulfa antibiotics    Review of Systems   Review of Systems  Physical Exam Updated Vital Signs BP (!) 147/92 (BP Location: Right Arm)   Pulse 81   Temp 97.7 F (36.5 C) (Temporal)   Resp 18   Ht 5' 2 (1.575 m)   Wt 64.6 kg   SpO2 100%   BMI 26.04 kg/m  Physical Exam Constitutional:      General: She is not in acute distress. HENT:     Head: Normocephalic and atraumatic.  Eyes:      Conjunctiva/sclera: Conjunctivae normal.     Pupils: Pupils are equal, round, and reactive to light.  Cardiovascular:     Rate and Rhythm: Normal rate and regular rhythm.  Pulmonary:     Effort: Pulmonary effort is normal. No respiratory distress.  Musculoskeletal:     Comments: Trigger point tenderness at the right trapezius muscle at the posterior angle of the neck, trigger point tenderness along the suprascapular region of the right shoulder  Skin:    General: Skin is warm and dry.  Neurological:     General: No focal deficit present.     Mental Status: She is alert and oriented to person, place, and time. Mental status is at baseline.  Psychiatric:        Mood and Affect: Mood normal.        Behavior: Behavior normal.     ED Results / Procedures / Treatments   Labs (all labs ordered are listed, but only abnormal results are displayed) Labs Reviewed - No data to display  EKG None  Radiology DG Cervical Spine Complete Result Date: 12/17/2023 CLINICAL DATA:  neck pain. EXAM: CERVICAL SPINE - COMPLETE 4+ VIEW COMPARISON:  None Available. FINDINGS: Anatomic cervical curvature. There is grade 1 anterolisthesis of C4 over C5, most likely degenerative. Vertebral body heights are maintained. No fracture or destructive lesion. The C1 lateral masses are symmetrically positioned around the odontoid process. Mild-moderate multilevel degenerative changes in the form of reduced intervertebral disc height, facet arthropathy and marginal osteophyte formation. There is mild-to-moderate narrowing of right C3 neural foramina. Remaining neural foramina are widely patent. Prevertebral soft tissues within normal limits. IMPRESSION: 1. No acute osseous abnormality of the cervical spine. 2. Mild-to-moderate multilevel degenerative changes, as described above. Electronically Signed   By: Beula Brunswick M.D.   On: 12/17/2023 10:38    Procedures Procedures    Medications Ordered in ED Medications   fentaNYL  (SUBLIMAZE ) injection 50 mcg (has no administration in time range)  dexamethasone  (DECADRON ) injection 8 mg (has no administration in time range)    ED Course/ Medical Decision Making/ A&P                                 Medical Decision Making Amount and/or Complexity of Data Reviewed Radiology: ordered.   Patient is here suspect a cervical radiculopathy with likely muscular component as well.  I have a low suspicion for vertebral dissection or injury.  Doubt spinal fracture.  No indication for MRI or CT imaging.  X-ray of the cervical spine was ordered from triage and personally reviewed by myself, with  no emergent findings.  I doubt ACS, PE, aortic dissection, or other emergent cause for her neck pain.  Will give the patient IM Decadron  as well as a single dose of IM fentanyl .  We discussed trying low-dose gabapentin  at home until she can follow-up with either her neurologist or her orthopedic doctor.  She already has Norco at home.  She and her husband verbalized understanding        Final Clinical Impression(s) / ED Diagnoses Final diagnoses:  Neck pain  Strain of neck muscle, initial encounter    Rx / DC Orders ED Discharge Orders          Ordered    gabapentin  (NEURONTIN ) 100 MG capsule  3 times daily PRN        12/17/23 1204              Arvilla Birmingham, MD 12/17/23 1205

## 2023-12-17 NOTE — Telephone Encounter (Signed)
 Its best if she made an appt

## 2023-12-18 ENCOUNTER — Ambulatory Visit: Admitting: Podiatry

## 2023-12-18 ENCOUNTER — Encounter: Payer: Self-pay | Admitting: Podiatry

## 2023-12-18 ENCOUNTER — Inpatient Hospital Stay

## 2023-12-18 ENCOUNTER — Inpatient Hospital Stay: Attending: Hematology

## 2023-12-18 DIAGNOSIS — M7752 Other enthesopathy of left foot: Secondary | ICD-10-CM

## 2023-12-18 DIAGNOSIS — G5793 Unspecified mononeuropathy of bilateral lower limbs: Secondary | ICD-10-CM

## 2023-12-18 DIAGNOSIS — Z79899 Other long term (current) drug therapy: Secondary | ICD-10-CM | POA: Insufficient documentation

## 2023-12-18 DIAGNOSIS — D509 Iron deficiency anemia, unspecified: Secondary | ICD-10-CM | POA: Diagnosis not present

## 2023-12-18 DIAGNOSIS — M7751 Other enthesopathy of right foot: Secondary | ICD-10-CM | POA: Diagnosis not present

## 2023-12-18 DIAGNOSIS — N1832 Chronic kidney disease, stage 3b: Secondary | ICD-10-CM

## 2023-12-18 LAB — CBC
HCT: 33.7 % — ABNORMAL LOW (ref 36.0–46.0)
Hemoglobin: 11.4 g/dL — ABNORMAL LOW (ref 12.0–15.0)
MCH: 33.6 pg (ref 26.0–34.0)
MCHC: 33.8 g/dL (ref 30.0–36.0)
MCV: 99.4 fL (ref 80.0–100.0)
Platelets: 153 10*3/uL (ref 150–400)
RBC: 3.39 MIL/uL — ABNORMAL LOW (ref 3.87–5.11)
RDW: 13 % (ref 11.5–15.5)
WBC: 6.2 10*3/uL (ref 4.0–10.5)
nRBC: 0 % (ref 0.0–0.2)

## 2023-12-18 MED ORDER — GABAPENTIN 300 MG PO CAPS: 300.0000 mg | ORAL_CAPSULE | Freq: Three times a day (TID) | ORAL | 3 refills | Status: DC

## 2023-12-18 MED ORDER — TRIAMCINOLONE ACETONIDE 40 MG/ML IJ SUSP
40.0000 mg | Freq: Once | INTRAMUSCULAR | Status: AC
  Administered 2023-12-18: 40 mg

## 2023-12-18 NOTE — Progress Notes (Signed)
 She presents today for follow-up of her neuromas which she stated doing fine.  She would like a shot of cortisone into her ankles as she points to the sinus tarsi.  And she states that she would like for us  to consider starting her back on her gabapentin  since she has not been taking it and she has noticed that her itching and tingling has come back.  Objective: Vital signs are stable she is alert and oriented x 3.  Pulses are palpable.  She has pain on palpation of the sinus tarsi and then range of motion of the subtalar joint.  Much decrease pain on palpation of the third interdigital spaces bilaterally.  Assessment: Subtalar joint capsulitis and neuropathy.  Plan: Started her back on gabapentin  300 mg 3 times a day.  Injected her sinus tarsi after sterile Betadine  skin prep 20 mg Kenalog  5 mg Marcaine .  Follow-up with her as needed.

## 2023-12-18 NOTE — Progress Notes (Signed)
 HGB 11.4. NO retacrit  given today.

## 2023-12-19 DIAGNOSIS — I129 Hypertensive chronic kidney disease with stage 1 through stage 4 chronic kidney disease, or unspecified chronic kidney disease: Secondary | ICD-10-CM | POA: Diagnosis not present

## 2023-12-19 DIAGNOSIS — N1832 Chronic kidney disease, stage 3b: Secondary | ICD-10-CM | POA: Diagnosis not present

## 2023-12-19 DIAGNOSIS — D631 Anemia in chronic kidney disease: Secondary | ICD-10-CM | POA: Diagnosis not present

## 2023-12-19 DIAGNOSIS — K209 Esophagitis, unspecified without bleeding: Secondary | ICD-10-CM | POA: Diagnosis not present

## 2023-12-19 DIAGNOSIS — D61818 Other pancytopenia: Secondary | ICD-10-CM | POA: Diagnosis not present

## 2023-12-19 DIAGNOSIS — K2970 Gastritis, unspecified, without bleeding: Secondary | ICD-10-CM | POA: Diagnosis not present

## 2023-12-22 ENCOUNTER — Telehealth: Payer: Self-pay | Admitting: Podiatry

## 2023-12-22 MED ORDER — GABAPENTIN 300 MG PO CAPS: 300.0000 mg | ORAL_CAPSULE | Freq: Three times a day (TID) | ORAL | 3 refills | Status: DC

## 2023-12-22 NOTE — Telephone Encounter (Signed)
 Patient states her prescription has not been sent in.  She was seen last week.  Needs Gabapentin  sent to [facility] in [place].

## 2023-12-23 ENCOUNTER — Telehealth (INDEPENDENT_AMBULATORY_CARE_PROVIDER_SITE_OTHER): Admitting: Psychiatry

## 2023-12-23 ENCOUNTER — Encounter (HOSPITAL_COMMUNITY): Payer: Self-pay | Admitting: Psychiatry

## 2023-12-23 DIAGNOSIS — F331 Major depressive disorder, recurrent, moderate: Secondary | ICD-10-CM | POA: Diagnosis not present

## 2023-12-23 MED ORDER — BUPROPION HCL ER (XL) 300 MG PO TB24: 300.0000 mg | ORAL_TABLET | ORAL | 2 refills | Status: DC

## 2023-12-23 NOTE — Progress Notes (Signed)
 Virtual Visit via Telephone Note  I connected with Jessica [name] on 12/23/23 at  9:40 AM EDT by telephone and verified that I am speaking with the correct person using two identifiers.  Location: Patient: home Provider: office   I discussed the limitations, risks, security and privacy concerns of performing an evaluation and management service by telephone and the availability of in person appointments. I also discussed with the patient that there may be a patient responsible charge related to this service. The patient expressed understanding and agreed to proceed.      I discussed the assessment and treatment plan with the patient. The patient was provided an opportunity to ask questions and all were answered. The patient agreed with the plan and demonstrated an understanding of the instructions.   The patient was advised to call back or seek an in-person evaluation if the symptoms worsen or if the condition fails to improve as anticipated.  I provided 20 minutes of non-face-to-face time during this encounter.   Alfredia Annas, MD  [facility] MD/PA/NP OP Progress Note  12/23/2023 9:59 AM Jessica [name]  MRN:  [id number]  Chief Complaint:  Chief Complaint  Patient presents with   Depression   Anxiety   Follow-up   HPI: This patient is a 73 year old married white female who lives with her husband in [place].  She has 2 grown sons.  She retired as a Civil [employer] fast streamer.  The patient returns for follow-up after about 6 weeks regarding her depression and anxiety.  She states that she is still struggling with her Crohn's disease.  She tried several Biologics and they all made her sick and reduced her hemoglobin hematocrit WBCs and RBCs.  She felt weak and was hospitalized in April for nausea and vomiting.  She is not on a biologic right now but her GI specialist wants to put her back on 1 because her Crohn's disease is still active.  She is eating somewhat better and holding her weight fairly  steady.  She states because of all these concerns about her health she has been more depressed.  We started Wellbutrin  XL 150 mg a while back but she asked if we can increase it.  She denies any thoughts of self-harm or suicide but does not have very much energy.  I think this is a reasonable request. Visit Diagnosis:    ICD-10-CM   1. Major depressive disorder, recurrent episode, moderate (HCC)  F33.1       Past Psychiatric History: Previous outpatient treatment  Past Medical History:  Past Medical History:  Diagnosis Date   Anxiety attack    Arthritis    Back pain, chronic    Chronic kidney disease    Crohn's colitis (HCC)    Depression (emotion)    Dysrhythmia    GERD (gastroesophageal reflux disease)    Hypertension    Hypothyroid    Iron deficiency anemia, unspecified    Nausea and vomiting 10/22/2023   Neuropathy    Sleep apnea    cpap is not working per patient - needs to find a new doctor - not used since 7/16 per pat    Stage 3 chronic kidney disease (HCC)    Vertigo    followed by Dr Kievie- in [place]     Past Surgical History:  Procedure Laterality Date   ABDOMINAL HYSTERECTOMY     BIOPSY  06/08/2018   Procedure: BIOPSY;  Surgeon: Ruby Corporal, MD;  [facility];  Service: Endoscopy;;  (  colon)   BIOPSY  04/01/2023   Procedure: BIOPSY;  Surgeon: Umberto Ganong, Bearl Limes, MD;  [facility];  Service: Gastroenterology;;   CHOLECYSTECTOMY     COLON SURGERY     for Crohn's in the 80s in [place]    COLONOSCOPY N/A 03/11/2013   Procedure: COLONOSCOPY;  Surgeon: Ruby Corporal, MD;  [facility];  Service: Endoscopy;  Laterality: N/A;  1200   COLONOSCOPY N/A 06/08/2018   Procedure: COLONOSCOPY;  Surgeon: Ruby Corporal, MD;  [facility];  Service: Endoscopy;  Laterality: N/A;  8:25   COLONOSCOPY WITH PROPOFOL  N/A 04/01/2023   Procedure: COLONOSCOPY WITH PROPOFOL ;  Surgeon: Urban Garden, MD;  [facility];  Service: Gastroenterology;  Laterality: N/A;   ESOPHAGEAL BRUSHING  04/01/2023   Procedure: ESOPHAGEAL BRUSHING;  Surgeon: Urban Garden, MD;  [facility];  Service: Gastroenterology;;   ESOPHAGOGASTRODUODENOSCOPY N/A 10/07/2023   Procedure: EGD (ESOPHAGOGASTRODUODENOSCOPY);  Surgeon: Umberto Ganong, Bearl Limes, MD;  [facility];  Service: Gastroenterology;  Laterality: N/A;  215[PM, ASA 3, pt knows to arrive at 6:15   ESOPHAGOGASTRODUODENOSCOPY N/A 10/23/2023   Procedure: EGD (ESOPHAGOGASTRODUODENOSCOPY);  Surgeon: Baldo Bonds, MD;  Location: [facility];  Service: Gastroenterology;  Laterality: N/A;   ESOPHAGOGASTRODUODENOSCOPY (EGD) WITH PROPOFOL  N/A 04/01/2023   Procedure: ESOPHAGOGASTRODUODENOSCOPY (EGD) WITH PROPOFOL ;  Surgeon: Urban Garden, MD;  [facility];  Service: Gastroenterology;  Laterality: N/A;  1:00am;asa 3   fatty tumor removed     HARDWARE REMOVAL Right 08/07/2016   Procedure: HARDWARE REMOVAL;  Surgeon: Liliane Rei, MD;  [facility]: [facility];  Service: Orthopedics;  Laterality: Right;   Hip replacement      Rt hip in 2010 in [place]   KNEE ARTHROSCOPY Left 03/29/2015   Procedure: ARTHROSCOPY LEFT KNEE WITH MENSICAL DEBRIDEMENT, chondroplasty;  Surgeon: Liliane Rei, MD;  [facility];  Service: Orthopedics;  Laterality: Left;   ORIF TIBIA PLATEAU Right 02/29/2016   Procedure: OPEN REDUCTION INTERNAL FIXATION (ORIF RIGHT  TIBIAL PLATEAU FRACTURE;  Surgeon: Liliane Rei, MD;  [facility];  Service: Orthopedics;  Laterality: Right;   POLYPECTOMY  04/01/2023   Procedure: POLYPECTOMY;  Surgeon: Urban Garden, MD;  [facility];  Service: Gastroenterology;;   TEE WITHOUT CARDIOVERSION N/A 03/27/2021   Procedure: TRANSESOPHAGEAL ECHOCARDIOGRAM (TEE);  Surgeon: Laurann Pollock, MD;  [facility];  Service: Endoscopy;  Laterality: N/A;   TOTAL KNEE ARTHROPLASTY Right  08/25/2017   Procedure: TOTAL RIGHT KNEE ARTHROPLASTY;  Surgeon: Liliane Rei, MD;  [facility];  Service: Orthopedics;  Laterality: Right;    Family Psychiatric History: See below  Family History:  Family History  Problem Relation Age of Onset   Crohn's disease Brother    Depression Mother    Anxiety disorder Mother    Depression Sister    Aneurysm Father    Colon cancer Neg Hx     Social History:  Social History   Socioeconomic History   Marital status: Married    Spouse name: Not on file   Number of children: Not on file   Years of education: Not on file   Highest education level: Not on file  Occupational History   Not on file  Tobacco Use   Smoking status: Never   Smokeless tobacco: Never  Vaping Use   Vaping status: Never Used  Substance and Sexual Activity   Alcohol  use: No    Alcohol /week: 0.0 standard drinks  of alcohol    Drug use: No   Sexual activity: Not Currently  Other Topics Concern   Not on file  Social History Narrative   Not on file   Social Drivers of Health   Financial Resource Strain: Not on file  Food Insecurity: No Food Insecurity (10/19/2023)   Hunger Vital Sign    Worried About Running Out of Food in the Last Year: Never true    Ran Out of Food in the Last Year: Never true  Transportation Needs: No Transportation Needs (10/19/2023)   PRAPARE - Administrator, Civil Service (Medical): No    Lack of Transportation (Non-Medical): No  Physical Activity: Not on file  Stress: Not on file  Social Connections: Socially Integrated (07/14/2023)   Social Connection and Isolation Panel    Frequency of Communication with Friends and Family: More than three times a week    Frequency of Social Gatherings with Friends and Family: More than three times a week    Attends Religious Services: More than 4 times per year    Active Member of [employer] or Organizations: Not on file    Attends Banker Meetings: More than 4 times per  year    Marital Status: Married    Allergies:  Allergies  Allergen Reactions   Robaxin  [Methocarbamol ] Itching   Sulfa Antibiotics Itching    ALL SULFA DRUGS    Metabolic Disorder Labs: Lab Results  Component Value Date   HGBA1C 5.1 07/02/2022   No results found for: PROLACTIN Lab Results  Component Value Date   CHOL 109 10/07/2023   TRIG 120 10/07/2023   HDL 42 10/07/2023   CHOLHDL 2.6 10/07/2023   VLDL 24 10/07/2023   LDLCALC 43 10/07/2023   LDLCALC 97 07/02/2022   Lab Results  Component Value Date   TSH 0.438 10/23/2023   TSH 0.331 (L) 10/22/2023    Therapeutic Level Labs: No results found for: LITHIUM No results found for: VALPROATE No results found for: CBMZ  Current Medications: Current Outpatient Medications  Medication Sig Dispense Refill   buPROPion  (WELLBUTRIN  XL) 300 MG 24 hr tablet Take 1 tablet (300 mg total) by mouth every morning. 30 tablet 2   acetaminophen  (TYLENOL ) 325 MG tablet Take 2 tablets (650 mg total) by mouth every 6 (six) hours as needed for mild pain (pain score 1-3), fever or headache (or Fever >/= 101).     Albuterol -Budesonide (AIRSUPRA ) 90-80 MCG/ACT AERO Inhale 2 puffs into the lungs every 8 (eight) hours. (Patient taking differently: Inhale 2 puffs into the lungs daily as needed (SOB).)     ALPRAZolam  (XANAX ) 0.5 MG tablet Take 0.5 mg by mouth 2 (two) times daily as needed.     apixaban  (ELIQUIS ) 5 MG TABS tablet Take 1 tablet (5 mg total) by mouth 2 (two) times daily. 60 tablet 4   Azelastine HCl 137 MCG/SPRAY SOLN Place 2 sprays into both nostrils 2 (two) times daily as needed (Congestion/ Allergies).     baclofen (LIORESAL) 10 MG tablet Take 10 mg by mouth 2 (two) times daily as needed.     calcitRIOL (ROCALTROL) 0.25 MCG capsule Take 0.25 mcg by mouth daily.     cyanocobalamin  (VITAMIN B12) 1000 MCG/ML injection Inject 1,000 mcg into the muscle once a week.     cyclobenzaprine (FLEXERIL) 5 MG tablet Take 5 mg by mouth 3  (three) times daily as needed.     dicyclomine  (BENTYL ) 10 MG capsule TAKE 1 CAPSULE BY MOUTH  TWICE DAILY BEFORE A MEAL 60 capsule 0   ferrous sulfate  325 (65 FE) MG EC tablet Take 1 tablet (325 mg total) by mouth daily with breakfast. 90 tablet 3   gabapentin  (NEURONTIN ) 300 MG capsule Take 1 capsule (300 mg total) by mouth 3 (three) times daily. 270 capsule 3   HYDROcodone -acetaminophen  (NORCO/VICODIN) 5-325 MG tablet 1 tablet Orally three times a day for 30 days As needed     levothyroxine  (SYNTHROID ) 100 MCG tablet Take 100 mcg by mouth daily.     mesalamine  (APRISO ) 0.375 g 24 hr capsule Take 4 capsules (1.5 g total) by mouth daily. 120 capsule 2   methocarbamol  (ROBAXIN ) 500 MG tablet Take 500 mg by mouth 3 (three) times daily as needed.     Multiple Vitamin (MULTIVITAMIN WITH MINERALS) TABS tablet Take 1 tablet by mouth daily.     ondansetron  (ZOFRAN -ODT) 4 MG disintegrating tablet Take 1 tablet (4 mg total) by mouth every 8 (eight) hours as needed. 14 tablet 0   pantoprazole  (PROTONIX ) 40 MG tablet Take 1 tablet (40 mg total) by mouth daily. 30 tablet 1   rOPINIRole  (REQUIP ) 1 MG tablet Take 3 mg by mouth at bedtime.     sodium bicarbonate 650 MG tablet Take 650 mg by mouth 2 (two) times daily.     thiamine (VITAMIN B-1) 100 MG tablet Take 100 mg by mouth daily.     No current facility-administered medications for this visit.     Musculoskeletal: Strength & Muscle Tone: na Gait & Station: na Patient leans: N/A  Psychiatric Specialty Exam: Review of Systems  Constitutional:  Positive for fatigue.  Musculoskeletal:  Positive for gait problem.  Neurological:  Positive for weakness.  Psychiatric/Behavioral:  Positive for dysphoric mood. The patient is nervous/anxious.   All other systems reviewed and are negative.   There were no vitals taken for this visit.There is no height or weight on file to calculate BMI.  General Appearance: NA  Eye Contact:  NA  Speech:  Clear and  Coherent  Volume:  Normal  Mood:  Anxious and Dysphoric  Affect:  NA  Thought Process:  Goal Directed  Orientation:  Full (Time, Place, and Person)  Thought Content: WDL   Suicidal Thoughts:  No  Homicidal Thoughts:  No  Memory:  Immediate;   Good Recent;   Good Remote;   NA  Judgement:  Good  Insight:  Fair  Psychomotor Activity:  Decreased  Concentration:  Concentration: Good and Attention Span: Good  Recall:  Good  Fund of Knowledge: Good  Language: Good  Akathisia:  No  Handed:  Right  AIMS (if indicated): not done  Assets:  Communication Skills Desire for Improvement Resilience Social Support Talents/Skills  ADL's:  Intact  Cognition: WNL  Sleep:  Good   Screenings: GAD-7    Flowsheet Row Office Visit from 09/30/2022 in [facility] [place] Office Visit from 08/23/2021 in [facility] [place]  Total GAD-7 Score 4 14   PHQ2-9    Flowsheet Row Clinical Support from 05/21/2023 in [facility] Visit from 09/30/2022 in [facility] [place] Office Visit from 08/23/2021 in [facility] [place] Video Visit from 12/22/2020 in [facility] at [facility] Total Score 0 0 1 2  PHQ-9 Total Score -- 3 6 6    Flowsheet Row ED from 12/17/2023 in [facility]  Emergency Department at [facility] ED to Hosp-Admission (Discharged) from 10/18/2023 in [facility] ED from 10/15/2023 in [facility] Emergency Department at [facility]  C-SSRS RISK CATEGORY No Risk No Risk No Risk     Assessment and Plan: This patient is a 74 year old female with a history of depression anxiety chronic kidney disease Crohn's disease and recent side effects from Biologics.  She is anxious about going on Skyrizi  which will probably be initiated later this month.   She would like to increase the Wellbutrin  XL from 150 mg to 300 mg daily and I think this is reasonable.  She will return to see me in 6 weeks  Collaboration of Care: Collaboration of Care: Other provider involved in patient's care AEB notes are shared with all specialist on the epic system  Patient/Guardian was advised Release of Information must be obtained prior to any record release in order to collaborate their care with an outside provider. Patient/Guardian was advised if they have not already done so to contact the registration department to sign all necessary forms in order for us  to release information regarding their care.   Consent: Patient/Guardian gives verbal consent for treatment and assignment of benefits for services provided during this visit. Patient/Guardian expressed understanding and agreed to proceed.    Alfredia Annas, MD 12/23/2023, 9:59 AM

## 2023-12-24 DIAGNOSIS — Z79891 Long term (current) use of opiate analgesic: Secondary | ICD-10-CM | POA: Diagnosis not present

## 2023-12-24 DIAGNOSIS — M47812 Spondylosis without myelopathy or radiculopathy, cervical region: Secondary | ICD-10-CM | POA: Diagnosis not present

## 2023-12-24 DIAGNOSIS — G894 Chronic pain syndrome: Secondary | ICD-10-CM | POA: Diagnosis not present

## 2023-12-24 DIAGNOSIS — K209 Esophagitis, unspecified without bleeding: Secondary | ICD-10-CM | POA: Diagnosis not present

## 2023-12-24 DIAGNOSIS — D631 Anemia in chronic kidney disease: Secondary | ICD-10-CM | POA: Diagnosis not present

## 2023-12-24 DIAGNOSIS — D61818 Other pancytopenia: Secondary | ICD-10-CM | POA: Diagnosis not present

## 2023-12-24 DIAGNOSIS — I129 Hypertensive chronic kidney disease with stage 1 through stage 4 chronic kidney disease, or unspecified chronic kidney disease: Secondary | ICD-10-CM | POA: Diagnosis not present

## 2023-12-24 DIAGNOSIS — K2970 Gastritis, unspecified, without bleeding: Secondary | ICD-10-CM | POA: Diagnosis not present

## 2023-12-24 DIAGNOSIS — N1832 Chronic kidney disease, stage 3b: Secondary | ICD-10-CM | POA: Diagnosis not present

## 2023-12-24 DIAGNOSIS — Z79899 Other long term (current) drug therapy: Secondary | ICD-10-CM | POA: Diagnosis not present

## 2023-12-26 ENCOUNTER — Ambulatory Visit: Admitting: Cardiology

## 2024-01-01 ENCOUNTER — Other Ambulatory Visit: Payer: Self-pay | Admitting: Cardiology

## 2024-01-01 ENCOUNTER — Inpatient Hospital Stay

## 2024-01-01 ENCOUNTER — Inpatient Hospital Stay: Admitting: Oncology

## 2024-01-05 DIAGNOSIS — I10 Essential (primary) hypertension: Secondary | ICD-10-CM | POA: Diagnosis not present

## 2024-01-05 NOTE — Progress Notes (Addendum)
 GI Office Note    Referring Provider: [name], MD Primary Care Physician:  [name], MD Primary Gastroenterologist: Toribio Rubins, MD  Date:  01/06/2024  ID:  Jessica [name], DOB August 13, 1950, MRN [id number]  Chief Complaint   Chief Complaint  Patient presents with   Follow-up    Follow up to discuss medications    History of Present Illness  Jessica [name] is a 73 y.o. female with a history of ileocolonic Crohn's disease s/p right hemicolectomy, history of pinhole fistula in 2019, HTN, chronic GERD, hypothyroidism, CKD stage III, OSA, and arthritis presenting today to discuss chron's medication and cirrhosis.   Colonoscopy 06/08/2018: - The examined portion of the ileum was normal.  - Mucosal ulceration. Biopsied.  - Colonic fistula.  - Internal hemorrhoids. -pathology: small reactive changes but no active disease -Repeat colonoscopy in 5 years.  -Patient requested to not go any therapy unless she relapses.   Office visit 01/30/21.  Doing well.  Heartburn controlled pantoprazole .  Willing to drop her dose in the future due to risk of osteopenia.  Unsure when due for next bone density.  Denied any dysphagia, hoarseness, sore throat, chronic cough.  Has occasional diarrhea and accidents but mostly she has 2 formed stools.  Stop dicyclomine .  Denied any melena or bright BPR.  Appetite good.    Last office visit 12/26/2022.  Some incontinence of urine and feces.  PCP recommended she be seen due to anemia.  Taking oral iron supplement daily.  Denied melena or BRBPR.  Stools normal though she is not having 5 to 6/day and usually moderate amount.  Taking Imodium  once daily and Bentyl  1-2 times daily.  Gets constipated sometimes with taking iron.  Due to see Dr. Rachele for her kidneys.  Still having fatigue.  Some mild dysphagia with food being stuck in her mid chest region, especially bread.  Using CPAP at night   Colonoscopy 04/01/23: -Crohn's disease with ileitis,  congestion/edema, erosions, erythema found in the neoterminal ileum Rtugeerts Score i1 -Associated narrowing of the lumen s/p biopsy -Patent end-to-side colocolonic anastomosis with healthy-appearing mucosa -Three 4-12 mm polyps in the transverse colon -Nonbleeding internal hemorrhoids -Advised to check TB and hepatitis B surface antigen in anticipation of treatment with Stelara /Entyvio /Skyrizi  -Pathology with ileal mucosa with focal erosion and active neutrophilic inflammation (NSAID versus Crohn's).  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     EGD 04/01/23: -LA grade C esophagitis with no bleeding s/p biopsy -KOH brushings obtained -4cm hiatal hernia -Normal stomach -Normal duodenum -GE junction biopsy with inflamed squamous mucosa with features consistent with severe reflux.  No yeast   Hep B surface antigen -neg. 04/01/2023.  TB Gold negative   OV 04/15/23.  Still having some fatigue related to anemia.  Had an episode of emesis after eating [place] food, also with spicy.  She is taking her pantoprazole  twice daily.  Having some mild dysphagia with solid food still.  Having bowel movements after every meal, usually 3/day.  Taking Imodium  and dicyclomine  which helps with her abdominal pain.  Does have associated arthritis. Up-to-date on COVID-vaccine and boosters as well as flu shot.  Shingles vaccine in October 2020.   Stelara  and Skyrizi  were sent in as options for treatment of Crohn's however given large co-pay for both of these, there was discussion about Entyvio  versus Remicade, given the higher risk of malignancy with Remicade, a shared decision was made with the patient to proceed with Entyvio .  After receiving a couple doses of Entyvio  patient developed severe joint pain therefore this was discontinued.  Since her last visit she has had 2 visits to [facility] with 1 admission.  Had an ED visit for pneumonia as well as a hospitalization secondary to pulmonary embolism.     EGD was scheduled for 07/18/2023, this was canceled and need to ensure healing/management of PE prior to rescheduling EGD.   Seen by pulmonology 07/22/2023.  Having chronic cough, chest CT ordered.  Advised to continue Eliquis  for PE. Will follow up in 1 month.    Received Monoferric  infusion 07/23/2023.   OV 08/14/2023. Still with some complaints of fatigue as well as baseline joint pain and some muscle pain.  Denied any melena or BRBPR.  Reportedly only having 1 bowel movement daily and had not been taking Imodium , more constipated since her hospitalization.  Having to strain and not having left abdominal pain.  Reported some reflux issues at night prior but not having frequent issues.  Takes pantoprazole  daily with occasional missed doses.  Had been taking her Eliquis  and still reporting dyspnea on exertion.  Asked continue daily iron.  Discussed continuing Entyvio  versus Remicade versus resubmitting for Stelara , she was advised to let us  know what she decided to do.  Advised to start MiraLAX  for constipation and hold Imodium  unless having significant diarrhea.  Scheduled for EGD with Dr. Eartha after getting pulmonary and cardiology clearance.  Continue PPI BID.   Received message from Dr. Eartha that we should try Rinvoq  for her Crohn's.  This was ultimately submitted.  Decision was made to go forward with Remicade if unable to afford Rinvoq .   EGD 10/07/2023: - White nummular lesions in esophageal mucosa. Biopsied.  - A medium amount of food ( residue) in the stomach.  - Normal examined duodenum. - Advised to stop omeprazole  and start pantoprazole . - Change her Rinvoq  to 30 mg daily - Path: No significant diagnostic alteration of esophageal tissue  She reported significant cough after her EGD, there was concern for aspiration pneumonia therefore antibiotics sent in for her.  Given her pancytopenia with normalization of CRP and lipid panel dark as needed advised recheck CBC and CMP in a  week.  CT C/A/P 10/15/23:  - Nodular hepatic contour - Scattered calcifications consistent with sequelae of prior granulomatous disease - No focal liver lesions - Small hiatal hernia - No acute intrathoracic, intra-abdominal, or intrapelvic abnormality  Admitted to [facility] 10/18/23 after she presented with persistent nausea and vomiting.  She was seen by GI at [facility] and underwent EGD below.  She requested restart Apriso  and stop her invoke given potential side effects including her pancytopenia.  Underwent CT scan as noted below.  CT A/P without contrast 10/18/2023: - Scattered calcifications noted within the liver stable from prior exam, mild nodularity noted, stable from prior. - Evidence of cholecystectomy - Normal-appearing pancreas - Scattered granulomas within the spleen, small accessory splenule - Postsurgical changes of right colon consistent with prior resection - No evidence of small bowel obstruction, normal stomach  EGD 10/23/2023: - Acute gastritis s/p biopsy - Grade a reflux esophagitis without bleeding - Medium size hiatal hernia - Normal duodenum - Path: Reactive gastropathy with foveolar hyperplasia, negative for metaplasia, or dysplasia.  Negative H. pylori  Last office visit 12/04/23 with Dr. Eartha.  Patient discussed that she was very fearful regarding any medications for Crohn's disease given she wants to avoid having more unwanted side effects.  She was reportedly taking  2 Imodium  pills every other day and 1 pill every other day and had been having 1-2 bowel movements per day that were soft in nature.  Denied any melena or hematochezia.  Did endorse some abdominal pain occasionally, some nausea at times.  She reports she has lost about 20 pounds since her symptoms started.  No jaundice or pruritus.  She was advised to read about medications for Crohn's disease including Tremfya, Skyrizi , Stelara , Remicade, and Humira.  Advise continue Apriso /mesalamine  for now.   I discussed for 1 hour the importance of treating her disease to avoid further complications.  She stated she would discuss which medication she wanted to try at her next appointment.  Plans to discuss imaging findings of cirrhosis at upcoming appointment.  Previous Crohn's medications: Pentasa , Entyvio , and Rinvoq   Today:  Chron's - 2-3 times daily if she takes dicyclomine  and imodium  AD once daily.  Taking mesalamine  since discharge. If she gets diarrhea she takes second dose of Imodium .  No complaints of constipation.  Denies any overt abdominal pain.  GERD -controlled with medication.  Cirrhosis -denies any jaundice, pruritus, peripheral edema, confusion, hallucinations, abdominal distention.  No alcohol  or tobacco use.  Wt Readings from Last 3 Encounters:  01/06/24 143 lb 6.4 oz (65 kg)  12/17/23 142 lb 6.3 oz (64.6 kg)  12/04/23 142 lb 6.4 oz (64.6 kg)    Current Outpatient Medications  Medication Sig Dispense Refill   acetaminophen  (TYLENOL ) 325 MG tablet Take 2 tablets (650 mg total) by mouth every 6 (six) hours as needed for mild pain (pain score 1-3), fever or headache (or Fever >/= 101).     Albuterol -Budesonide (AIRSUPRA ) 90-80 MCG/ACT AERO Inhale 2 puffs into the lungs every 8 (eight) hours.     ALPRAZolam  (XANAX ) 0.5 MG tablet Take 0.5 mg by mouth 2 (two) times daily as needed.     apixaban  (ELIQUIS ) 5 MG TABS tablet Take 1 tablet (5 mg total) by mouth 2 (two) times daily. 60 tablet 4   Azelastine HCl 137 MCG/SPRAY SOLN Place 2 sprays into both nostrils 2 (two) times daily as needed (Congestion/ Allergies).     baclofen (LIORESAL) 10 MG tablet Take 10 mg by mouth 2 (two) times daily as needed.     calcitRIOL (ROCALTROL) 0.25 MCG capsule Take 0.25 mcg by mouth daily.     cyanocobalamin  (VITAMIN B12) 1000 MCG/ML injection Inject 1,000 mcg into the muscle once a week.     cyclobenzaprine (FLEXERIL) 5 MG tablet Take 5 mg by mouth 3 (three) times daily as needed.      dicyclomine  (BENTYL ) 10 MG capsule TAKE 1 CAPSULE BY MOUTH TWICE DAILY BEFORE A MEAL 60 capsule 0   ferrous sulfate  325 (65 FE) MG EC tablet Take 1 tablet (325 mg total) by mouth daily with breakfast. 90 tablet 3   gabapentin  (NEURONTIN ) 300 MG capsule Take 1 capsule (300 mg total) by mouth 3 (three) times daily. 270 capsule 3   levothyroxine  (SYNTHROID ) 100 MCG tablet Take 100 mcg by mouth daily.     mesalamine  (APRISO ) 0.375 g 24 hr capsule Take 4 capsules (1.5 g total) by mouth daily. 120 capsule 2   methocarbamol  (ROBAXIN ) 500 MG tablet Take 500 mg by mouth 3 (three) times daily as needed.     Multiple Vitamin (MULTIVITAMIN WITH MINERALS) TABS tablet Take 1 tablet by mouth daily.     ondansetron  (ZOFRAN -ODT) 4 MG disintegrating tablet Take 1 tablet (4 mg total) by mouth every 8 (eight) hours  as needed. 14 tablet 0   pantoprazole  (PROTONIX ) 40 MG tablet Take 1 tablet (40 mg total) by mouth daily. 30 tablet 1   rOPINIRole  (REQUIP ) 1 MG tablet Take 3 mg by mouth at bedtime.     sodium bicarbonate 650 MG tablet Take 650 mg by mouth 2 (two) times daily.     thiamine (VITAMIN B-1) 100 MG tablet Take 100 mg by mouth daily.     buPROPion  (WELLBUTRIN  XL) 300 MG 24 hr tablet Take 1 tablet (300 mg total) by mouth every morning. (Patient not taking: Reported on 01/06/2024) 30 tablet 2   No current facility-administered medications for this visit.    Past Medical History:  Diagnosis Date   Anxiety attack    Arthritis    Back pain, chronic    Chronic kidney disease    Crohn's colitis (HCC)    Depression (emotion)    Dysrhythmia    GERD (gastroesophageal reflux disease)    Hypertension    Hypothyroid    Iron deficiency anemia, unspecified    Nausea and vomiting 10/22/2023   Neuropathy    Sleep apnea    cpap is not working per patient - needs to find a new doctor - not used since 7/16 per pat    Stage 3 chronic kidney disease (HCC)    Vertigo    followed by Dr Kievie- in [place]     Past  Surgical History:  Procedure Laterality Date   ABDOMINAL HYSTERECTOMY     BIOPSY  06/08/2018   Procedure: BIOPSY;  Surgeon: Golda Claudis PENNER, MD;  [facility];  Service: Endoscopy;;  (colon)   BIOPSY  04/01/2023   Procedure: BIOPSY;  Surgeon: Eartha Flavors, Toribio, MD;  [facility];  Service: Gastroenterology;;   CHOLECYSTECTOMY     COLON SURGERY     for Crohn's in the 80s in [place]    COLONOSCOPY N/A 03/11/2013   Procedure: COLONOSCOPY;  Surgeon: Claudis PENNER Golda, MD;  [facility];  Service: Endoscopy;  Laterality: N/A;  1200   COLONOSCOPY N/A 06/08/2018   Procedure: COLONOSCOPY;  Surgeon: Golda Claudis PENNER, MD;  [facility];  Service: Endoscopy;  Laterality: N/A;  8:25   COLONOSCOPY WITH PROPOFOL  N/A 04/01/2023   Procedure: COLONOSCOPY WITH PROPOFOL ;  Surgeon: Eartha Flavors Toribio, MD;  [facility];  Service: Gastroenterology;  Laterality: N/A;   ESOPHAGEAL BRUSHING  04/01/2023   Procedure: ESOPHAGEAL BRUSHING;  Surgeon: Eartha Flavors Toribio, MD;  [facility];  Service: Gastroenterology;;   ESOPHAGOGASTRODUODENOSCOPY N/A 10/07/2023   Procedure: EGD (ESOPHAGOGASTRODUODENOSCOPY);  Surgeon: Eartha Flavors, Toribio, MD;  [facility];  Service: Gastroenterology;  Laterality: N/A;  215[PM, ASA 3, pt knows to arrive at 6:15   ESOPHAGOGASTRODUODENOSCOPY N/A 10/23/2023   Procedure: EGD (ESOPHAGOGASTRODUODENOSCOPY);  Surgeon: Dianna Specking, MD;  Location: [facility];  Service: Gastroenterology;  Laterality: N/A;   ESOPHAGOGASTRODUODENOSCOPY (EGD) WITH PROPOFOL  N/A 04/01/2023   Procedure: ESOPHAGOGASTRODUODENOSCOPY (EGD) WITH PROPOFOL ;  Surgeon: Eartha Flavors Toribio, MD;  [facility];  Service: Gastroenterology;  Laterality: N/A;  1:00am;asa 3   fatty tumor removed     HARDWARE REMOVAL Right 08/07/2016   Procedure: HARDWARE REMOVAL;  Surgeon: Dempsey Moan, MD;  Location: [facility];  Service:  Orthopedics;  Laterality: Right;   Hip replacement      Rt hip in 2010 in [place]   KNEE ARTHROSCOPY Left 03/29/2015   Procedure: ARTHROSCOPY LEFT KNEE WITH MENSICAL DEBRIDEMENT, chondroplasty;  Surgeon: Dempsey  Aluisio, MD;  Location: [facility];  Service: Orthopedics;  Laterality: Left;   ORIF TIBIA PLATEAU Right 02/29/2016   Procedure: OPEN REDUCTION INTERNAL FIXATION (ORIF RIGHT  TIBIAL PLATEAU FRACTURE;  Surgeon: Dempsey Moan, MD;  Location: [facility];  Service: Orthopedics;  Laterality: Right;   POLYPECTOMY  04/01/2023   Procedure: POLYPECTOMY;  Surgeon: Eartha Angelia Sieving, MD;  [facility];  Service: Gastroenterology;;   TEE WITHOUT CARDIOVERSION N/A 03/27/2021   Procedure: TRANSESOPHAGEAL ECHOCARDIOGRAM (TEE);  Surgeon: Alvan Dorn FALCON, MD;  [facility];  Service: Endoscopy;  Laterality: N/A;   TOTAL KNEE ARTHROPLASTY Right 08/25/2017   Procedure: TOTAL RIGHT KNEE ARTHROPLASTY;  Surgeon: Moan Dempsey, MD;  Location: [facility];  Service: Orthopedics;  Laterality: Right;    Family History  Problem Relation Age of Onset   Crohn's disease Brother    Depression Mother    Anxiety disorder Mother    Depression Sister    Aneurysm Father    Colon cancer Neg Hx     Allergies as of 01/06/2024 - Review Complete 01/06/2024  Allergen Reaction Noted   Robaxin  [methocarbamol ] Itching 07/29/2016   Sulfa antibiotics Itching 07/29/2016    Social History   Socioeconomic History   Marital status: Married    Spouse name: Not on file   Number of children: Not on file   Years of education: Not on file   Highest education level: Not on file  Occupational History   Not on file  Tobacco Use   Smoking status: Never   Smokeless tobacco: Never  Vaping Use   Vaping status: Never Used  Substance and Sexual Activity   Alcohol  use: No    Alcohol /week: 0.0 standard drinks of alcohol    Drug use: No   Sexual activity: Not Currently  Other Topics Concern   Not on file   Social History Narrative   Not on file   Social Drivers of Health   Financial Resource Strain: Not on file  Food Insecurity: No Food Insecurity (10/19/2023)   Hunger Vital Sign    Worried About Running Out of Food in the Last Year: Never true    Ran Out of Food in the Last Year: Never true  Transportation Needs: No Transportation Needs (10/19/2023)   PRAPARE - Administrator, Civil Service (Medical): No    Lack of Transportation (Non-Medical): No  Physical Activity: Not on file  Stress: Not on file  Social Connections: Socially Integrated (07/14/2023)   Social Connection and Isolation Panel    Frequency of Communication with Friends and Family: More than three times a week    Frequency of Social Gatherings with Friends and Family: More than three times a week    Attends Religious Services: More than 4 times per year    Active Member of [employer] or Organizations: Not on file    Attends Engineer, structural: More than 4 times per year    Marital Status: Married     Review of Systems   Gen: Denies fever, chills, anorexia. Denies fatigue, weakness, weight loss.  CV: Denies chest pain, palpitations, syncope, peripheral edema, and claudication. Resp: Denies dyspnea at rest, cough, wheezing, coughing up blood, and pleurisy. GI: See HPI Derm: Denies rash, itching, dry skin Psych: Denies depression, anxiety, memory loss, confusion. No homicidal or suicidal ideation.  Heme: Denies bruising, bleeding, and enlarged lymph nodes.  Physical Exam   BP 138/80 (BP Location: Right Arm, Patient Position: Sitting, Cuff Size: Normal)  Pulse 94   Temp 97.7 F (36.5 C) (Temporal)   Ht 5' 2 (1.575 m)   Wt 143 lb 6.4 oz (65 kg)   BMI 26.23 kg/m   General:   Alert and oriented. No distress noted. Pleasant and cooperative.  Head:  Normocephalic and atraumatic. Eyes:  Conjuctiva clear without scleral icterus. Mouth:  Oral mucosa pink and moist. Good dentition. No  lesions. Abdomen:  +BS, soft, non-distended. Mild ttp to RLQ. No rebound or guarding. No HSM or masses noted. Rectal: deferred Msk:  Symmetrical without gross deformities. Normal posture. Extremities:  Without edema. Scattered bruising to BUE Neurologic:  Alert and  oriented x4 Psych:  Alert and cooperative. Normal mood and affect.  Assessment  ARASELI SHERRY is a 73 y.o. female with a history of ileocolonic Crohn's disease s/p right hemicolectomy, history of pinhole fistula in 2019, HTN, chronic GERD, hypothyroidism, CKD stage III, OSA, and arthritis presenting today for follow-up of Crohn's and to discuss further workup of possible cirrhosis.  Crohn's ileitis:  -History of colonic involvement s/p right hemicolectomy.  Also with history of pinhole fistula.  -Recent colonoscopy with ongoing active disease. - Currently on Apriso  daily, overall feeling much more improved. - Has previously tried and failed Pentasa , Rinvoq , and Entyvio .  Query actual reaction to Entyvio  although this was around timing of her PE as well as her pneumonia.  Appears she was having significant pancytopenia secondary to Rinvoq  therefore this was discontinued. - Currently stools controlled with Imodium  and dicyclomine  - Had another extensive discussion today regarding options for therapy.  After discussing side effects, she has decided to proceed with Skyrizi .  Will submit for this today. - Plan to continue mesalamine  until she starts Skyrizi , will possibly keep on mesalamine  until induction is finished.  Anemia, PE:  - Currently on Eliquis  for PE - Has documented chronic anemia likely multifactorial. - Most recent hemoglobin 2 weeks ago increased above baseline, stable at 11.4.  GERD, dysphagia, esophagitis:  - EGD in April with congestion, erosions, erythema throughout the gastric antrum. - Evidence of grade a esophagitis on recent EGD. - Reflux symptoms well-controlled with pantoprazole  40 mg once daily. - Has a  documented weight loss since all of her recent acute illnesses.  Currently appetite stable.  Cirrhosis:  - Most likely etiology is MASLD - Prior workup negative for hepatitis B, will order additional serologies to assess for other potential etiologies.  Will also check AFP for baseline. - Nodularity noted on CT A/P in April 2025 - Given imaging completed in April, will repeat in October. - Discussed importance of further workup as well as regular monitoring.  Discussed signs/symptoms to be aware of. - Cirrhosis likely early stages. - No evidence of portal hypertension, platelets have been low but this is likely secondary to medications.  No evidence of splenomegaly on recent imaging.   PLAN   RUQ US  in October  Labs - CBC, CMP, INR, AFP, ASMA, AMA, immunoglobulins, viral hepatitis panel Skyrizi  - submit for this Continue pantoprazole  40 mg once daily Continue dicyclomine  daily for now and imodium  AD daily.  Mesalamine  until induction finished.  Follow up in 4 months.    Jessica Melia, MSN, FNP-BC, AGACNP-BC [facility]  I have reviewed the note and agree with the APP's assessment as described in this progress note  Toribio Fortune, MD Gastroenterology and Hepatology [facility]

## 2024-01-06 ENCOUNTER — Ambulatory Visit (INDEPENDENT_AMBULATORY_CARE_PROVIDER_SITE_OTHER): Admitting: Gastroenterology

## 2024-01-06 ENCOUNTER — Encounter: Payer: Self-pay | Admitting: Gastroenterology

## 2024-01-06 VITALS — BP 138/80 | HR 94 | Temp 97.7°F | Ht 62.0 in | Wt 143.4 lb

## 2024-01-06 DIAGNOSIS — I2699 Other pulmonary embolism without acute cor pulmonale: Secondary | ICD-10-CM

## 2024-01-06 DIAGNOSIS — D508 Other iron deficiency anemias: Secondary | ICD-10-CM

## 2024-01-06 DIAGNOSIS — D649 Anemia, unspecified: Secondary | ICD-10-CM

## 2024-01-06 DIAGNOSIS — K5000 Crohn's disease of small intestine without complications: Secondary | ICD-10-CM

## 2024-01-06 DIAGNOSIS — K7460 Unspecified cirrhosis of liver: Secondary | ICD-10-CM

## 2024-01-06 DIAGNOSIS — D61818 Other pancytopenia: Secondary | ICD-10-CM

## 2024-01-06 DIAGNOSIS — K50019 Crohn's disease of small intestine with unspecified complications: Secondary | ICD-10-CM

## 2024-01-06 DIAGNOSIS — R1310 Dysphagia, unspecified: Secondary | ICD-10-CM

## 2024-01-06 DIAGNOSIS — K219 Gastro-esophageal reflux disease without esophagitis: Secondary | ICD-10-CM

## 2024-01-06 DIAGNOSIS — K2100 Gastro-esophageal reflux disease with esophagitis, without bleeding: Secondary | ICD-10-CM

## 2024-01-06 MED ORDER — PANTOPRAZOLE SODIUM 40 MG PO TBEC: 40.0000 mg | DELAYED_RELEASE_TABLET | Freq: Every day | ORAL | 1 refills | Status: DC

## 2024-01-06 MED ORDER — SKYRIZI 180 MG/1.2ML ~~LOC~~ SOCT: 180.0000 mg | SUBCUTANEOUS | 6 refills | Status: DC

## 2024-01-06 NOTE — Patient Instructions (Addendum)
 We will start the process for Skyrizi .  You will likely receive a phone call from the infusion center to start infusions.  In the meantime we will submit the Skyrizi  subcutaneous injections to [employer] to start the approval/prior authorization process.  Continue your pantoprazole  40 mg daily, I sent refill in for you today.  For now you may continue your mesalamine , dicyclomine , and Imodium .  You can start reducing these as needed after starting the Skyrizi .  I have ordered additional lab work for you, this is to further evaluate possible cirrhosis.  We will plan on repeat imaging in October with ultrasound.  If this continues to show nodularity then I will confirm diagnosis of cirrhosis and we will monitor as appropriate.  Follow-up in 4 months.  Please get me updated if there are any issues.  It was a pleasure to see you today. I want to create trusting relationships with patients. If you receive a survey regarding your visit,  I greatly appreciate you taking time to fill this out on paper or through your MyChart. I value your feedback.  Charmaine Melia, MSN, FNP-BC, AGACNP-BC [facility]

## 2024-01-07 ENCOUNTER — Inpatient Hospital Stay: Admitting: Oncology

## 2024-01-07 ENCOUNTER — Telehealth: Payer: Self-pay

## 2024-01-07 ENCOUNTER — Inpatient Hospital Stay

## 2024-01-07 NOTE — Telephone Encounter (Signed)
 Pt has been approved for infusions. Needs to do these first to see how she reacts then can apply for at home injections.

## 2024-01-07 NOTE — Telephone Encounter (Addendum)
 Hello,  Patient will be scheduled as soon as possible.  Auth Submission: NO AUTH NEEDED Site of care: Site of care: [facility] Payer: medicare a/b, [employer] [place] supp Medication & CPT/J Code(s) submitted: Skyrizi  (Risankizumab -rzaa) G7672 Diagnosis Code:  Route of submission (phone, fax, portal): portal Phone # Fax # Auth type: Buy/Bill HB Units/visits requested: 600mg  x 3 doses Reference number:  Approval from: 01/07/24 to 07/07/24

## 2024-01-08 ENCOUNTER — Other Ambulatory Visit (HOSPITAL_COMMUNITY): Payer: Self-pay | Admitting: Specialist

## 2024-01-08 DIAGNOSIS — R079 Chest pain, unspecified: Secondary | ICD-10-CM

## 2024-01-08 DIAGNOSIS — Z8673 Personal history of transient ischemic attack (TIA), and cerebral infarction without residual deficits: Secondary | ICD-10-CM | POA: Diagnosis not present

## 2024-01-08 DIAGNOSIS — M7912 Myalgia of auxiliary muscles, head and neck: Secondary | ICD-10-CM | POA: Diagnosis not present

## 2024-01-08 DIAGNOSIS — G3184 Mild cognitive impairment, so stated: Secondary | ICD-10-CM | POA: Diagnosis not present

## 2024-01-08 DIAGNOSIS — G603 Idiopathic progressive neuropathy: Secondary | ICD-10-CM

## 2024-01-08 DIAGNOSIS — G2581 Restless legs syndrome: Secondary | ICD-10-CM | POA: Diagnosis not present

## 2024-01-08 DIAGNOSIS — M5417 Radiculopathy, lumbosacral region: Secondary | ICD-10-CM

## 2024-01-08 DIAGNOSIS — G5603 Carpal tunnel syndrome, bilateral upper limbs: Secondary | ICD-10-CM | POA: Diagnosis not present

## 2024-01-08 DIAGNOSIS — M7918 Myalgia, other site: Secondary | ICD-10-CM | POA: Diagnosis not present

## 2024-01-08 DIAGNOSIS — Z79891 Long term (current) use of opiate analgesic: Secondary | ICD-10-CM | POA: Diagnosis not present

## 2024-01-08 DIAGNOSIS — G894 Chronic pain syndrome: Secondary | ICD-10-CM | POA: Diagnosis not present

## 2024-01-08 NOTE — Telephone Encounter (Signed)
 I put a paper from [employer] on your desk with a number you can call.

## 2024-01-11 ENCOUNTER — Ambulatory Visit (HOSPITAL_COMMUNITY)
Admission: RE | Admit: 2024-01-11 | Discharge: 2024-01-11 | Disposition: A | Discharge status: Home / Self Care | Source: Ambulatory Visit | Attending: Specialist | Admitting: Specialist

## 2024-01-11 DIAGNOSIS — M5417 Radiculopathy, lumbosacral region: Secondary | ICD-10-CM | POA: Diagnosis not present

## 2024-01-11 DIAGNOSIS — R079 Chest pain, unspecified: Secondary | ICD-10-CM | POA: Insufficient documentation

## 2024-01-11 DIAGNOSIS — M2548 Effusion, other site: Secondary | ICD-10-CM | POA: Diagnosis not present

## 2024-01-11 DIAGNOSIS — G603 Idiopathic progressive neuropathy: Secondary | ICD-10-CM | POA: Insufficient documentation

## 2024-01-11 DIAGNOSIS — M47812 Spondylosis without myelopathy or radiculopathy, cervical region: Secondary | ICD-10-CM | POA: Diagnosis not present

## 2024-01-11 DIAGNOSIS — M5021 Other cervical disc displacement,  high cervical region: Secondary | ICD-10-CM | POA: Diagnosis not present

## 2024-01-11 DIAGNOSIS — M4802 Spinal stenosis, cervical region: Secondary | ICD-10-CM | POA: Diagnosis not present

## 2024-01-13 ENCOUNTER — Telehealth (INDEPENDENT_AMBULATORY_CARE_PROVIDER_SITE_OTHER): Payer: Self-pay

## 2024-01-13 ENCOUNTER — Telehealth (HOSPITAL_BASED_OUTPATIENT_CLINIC_OR_DEPARTMENT_OTHER): Payer: Self-pay

## 2024-01-13 ENCOUNTER — Encounter: Attending: Gastroenterology | Admitting: Emergency Medicine

## 2024-01-13 VITALS — BP 135/76 | HR 79 | Temp 98.0°F | Resp 16

## 2024-01-13 DIAGNOSIS — R600 Localized edema: Secondary | ICD-10-CM | POA: Insufficient documentation

## 2024-01-13 DIAGNOSIS — R5383 Other fatigue: Secondary | ICD-10-CM | POA: Insufficient documentation

## 2024-01-13 DIAGNOSIS — Z8673 Personal history of transient ischemic attack (TIA), and cerebral infarction without residual deficits: Secondary | ICD-10-CM | POA: Diagnosis not present

## 2024-01-13 DIAGNOSIS — Z01818 Encounter for other preprocedural examination: Secondary | ICD-10-CM | POA: Diagnosis present

## 2024-01-13 DIAGNOSIS — I340 Nonrheumatic mitral (valve) insufficiency: Secondary | ICD-10-CM | POA: Diagnosis not present

## 2024-01-13 DIAGNOSIS — R0609 Other forms of dyspnea: Secondary | ICD-10-CM | POA: Insufficient documentation

## 2024-01-13 DIAGNOSIS — R002 Palpitations: Secondary | ICD-10-CM | POA: Insufficient documentation

## 2024-01-13 DIAGNOSIS — I129 Hypertensive chronic kidney disease with stage 1 through stage 4 chronic kidney disease, or unspecified chronic kidney disease: Secondary | ICD-10-CM | POA: Insufficient documentation

## 2024-01-13 DIAGNOSIS — E039 Hypothyroidism, unspecified: Secondary | ICD-10-CM | POA: Diagnosis not present

## 2024-01-13 DIAGNOSIS — K7460 Unspecified cirrhosis of liver: Secondary | ICD-10-CM | POA: Diagnosis not present

## 2024-01-13 DIAGNOSIS — D631 Anemia in chronic kidney disease: Secondary | ICD-10-CM | POA: Diagnosis not present

## 2024-01-13 DIAGNOSIS — G4733 Obstructive sleep apnea (adult) (pediatric): Secondary | ICD-10-CM | POA: Insufficient documentation

## 2024-01-13 DIAGNOSIS — K5090 Crohn's disease, unspecified, without complications: Secondary | ICD-10-CM | POA: Diagnosis not present

## 2024-01-13 DIAGNOSIS — K5080 Crohn's disease of both small and large intestine without complications: Secondary | ICD-10-CM

## 2024-01-13 DIAGNOSIS — E785 Hyperlipidemia, unspecified: Secondary | ICD-10-CM | POA: Diagnosis not present

## 2024-01-13 DIAGNOSIS — N183 Chronic kidney disease, stage 3 unspecified: Secondary | ICD-10-CM | POA: Insufficient documentation

## 2024-01-13 DIAGNOSIS — Z79899 Other long term (current) drug therapy: Secondary | ICD-10-CM | POA: Diagnosis not present

## 2024-01-13 DIAGNOSIS — Z86711 Personal history of pulmonary embolism: Secondary | ICD-10-CM | POA: Diagnosis not present

## 2024-01-13 MED ORDER — DIPHENHYDRAMINE HCL 25 MG PO CAPS
25.0000 mg | ORAL_CAPSULE | Freq: Once | ORAL | Status: AC
  Administered 2024-01-13: 25 mg via ORAL

## 2024-01-13 MED ORDER — METHYLPREDNISOLONE SODIUM SUCC 125 MG IJ SOLR
125.0000 mg | Freq: Once | INTRAMUSCULAR | Status: AC
  Administered 2024-01-13: 125 mg via INTRAVENOUS

## 2024-01-13 MED ORDER — METHYLPREDNISOLONE SODIUM SUCC 125 MG IJ SOLR: 125.0000 mg | Freq: Once | INTRAMUSCULAR | Status: DC

## 2024-01-13 MED ORDER — SODIUM CHLORIDE 0.9 % IV SOLN
600.0000 mg | Freq: Once | INTRAVENOUS | Status: AC
  Administered 2024-01-13: 600 mg via INTRAVENOUS
  Filled 2024-01-13: qty 10

## 2024-01-13 NOTE — Telephone Encounter (Signed)
 CMN received signed by provider and faxed

## 2024-01-13 NOTE — Progress Notes (Signed)
 Diagnosis: Crohn's Disease  Provider:  Kennedy Pfeiffer NP  Procedure: IV Infusion  IV Type: Peripheral, IV Location: R Hand  Skyrizi  (risankizumab -rzaa), Dose: 600 mg  Infusion Start Time: 1400  Infusion Stop Time: 1505  Post Infusion IV Care: Observation period completed and Peripheral IV Discontinued  Discharge: Condition: Good, Destination: Home . AVS Provided  Performed by:  Delon ONEIDA Officer, RN

## 2024-01-13 NOTE — Telephone Encounter (Signed)
 Note sent to Charmaine Melia, NP on 01/13/2024:   This patient was recently started on Skyrizi  IV with infusion center. Did you have Charmaine start the process for the On body maintenance dosing?   31 mins Charmaine LITTIE Melia, NP yes. She is approved and they actually have already reached out to her to fill out  1  CM fill the precription!

## 2024-01-14 ENCOUNTER — Other Ambulatory Visit: Payer: Self-pay | Admitting: Gastroenterology

## 2024-01-14 DIAGNOSIS — D61818 Other pancytopenia: Secondary | ICD-10-CM | POA: Diagnosis not present

## 2024-01-14 DIAGNOSIS — K7460 Unspecified cirrhosis of liver: Secondary | ICD-10-CM | POA: Diagnosis not present

## 2024-01-15 ENCOUNTER — Inpatient Hospital Stay

## 2024-01-15 LAB — COMPREHENSIVE METABOLIC PANEL WITH GFR
ALT: 16 IU/L (ref 0–32)
AST: 22 IU/L (ref 0–40)
Albumin: 4.1 g/dL (ref 3.8–4.8)
Alkaline Phosphatase: 152 IU/L — ABNORMAL HIGH (ref 44–121)
BUN/Creatinine Ratio: 14 (ref 12–28)
BUN: 19 mg/dL (ref 8–27)
Bilirubin Total: 0.3 mg/dL (ref 0.0–1.2)
CO2: 23 mmol/L (ref 20–29)
Calcium: 9.2 mg/dL (ref 8.7–10.3)
Chloride: 107 mmol/L — ABNORMAL HIGH (ref 96–106)
Creatinine, Ser: 1.34 mg/dL — ABNORMAL HIGH (ref 0.57–1.00)
Globulin, Total: 2.7 g/dL (ref 1.5–4.5)
Glucose: 85 mg/dL (ref 70–99)
Potassium: 3.7 mmol/L (ref 3.5–5.2)
Sodium: 144 mmol/L (ref 134–144)
Total Protein: 6.8 g/dL (ref 6.0–8.5)
eGFR: 42 mL/min/1.73 — ABNORMAL LOW (ref >59)

## 2024-01-15 LAB — IGG, IGA, IGM
IgA/Immunoglobulin A, Serum: 267 mg/dL (ref 64–422)
IgG (Immunoglobin G), Serum: 1521 mg/dL (ref 586–1602)
IgM (Immunoglobulin M), Srm: 152 mg/dL (ref 26–217)

## 2024-01-15 LAB — HEPATITIS C ANTIBODY: Hep C Virus Ab: NONREACTIVE

## 2024-01-15 LAB — ANA W/REFLEX IF POSITIVE: Anti Nuclear Antibody (ANA): NEGATIVE

## 2024-01-15 LAB — ANTI-SMOOTH MUSCLE ANTIBODY, IGG: Smooth Muscle Ab: 49 U — ABNORMAL HIGH (ref 0–19)

## 2024-01-15 LAB — PROTIME-INR
INR: 1.1 (ref 0.9–1.2)
Prothrombin Time: 11.8 s (ref 9.1–12.0)

## 2024-01-15 LAB — HEPATITIS A ANTIBODY, IGM: Hep A IgM: NEGATIVE

## 2024-01-15 LAB — MITOCHONDRIAL ANTIBODIES: Mitochondrial Ab: 53.7 U — ABNORMAL HIGH (ref 0.0–20.0)

## 2024-01-15 LAB — AFP TUMOR MARKER: AFP, Serum, Tumor Marker: 2.1 ng/mL (ref 0.0–9.2)

## 2024-01-15 LAB — HEPATITIS A ANTIBODY, TOTAL: hep A Total Ab: NEGATIVE

## 2024-01-16 ENCOUNTER — Inpatient Hospital Stay

## 2024-01-16 ENCOUNTER — Inpatient Hospital Stay (HOSPITAL_BASED_OUTPATIENT_CLINIC_OR_DEPARTMENT_OTHER): Admitting: Oncology

## 2024-01-16 ENCOUNTER — Ambulatory Visit: Payer: Self-pay | Admitting: Gastroenterology

## 2024-01-16 ENCOUNTER — Inpatient Hospital Stay: Attending: Hematology

## 2024-01-16 VITALS — BP 145/72 | HR 60 | Temp 97.8°F | Resp 16 | Wt 140.6 lb

## 2024-01-16 DIAGNOSIS — Z86711 Personal history of pulmonary embolism: Secondary | ICD-10-CM | POA: Insufficient documentation

## 2024-01-16 DIAGNOSIS — R7989 Other specified abnormal findings of blood chemistry: Secondary | ICD-10-CM | POA: Diagnosis not present

## 2024-01-16 DIAGNOSIS — D509 Iron deficiency anemia, unspecified: Secondary | ICD-10-CM

## 2024-01-16 DIAGNOSIS — R42 Dizziness and giddiness: Secondary | ICD-10-CM | POA: Diagnosis not present

## 2024-01-16 DIAGNOSIS — I2699 Other pulmonary embolism without acute cor pulmonale: Secondary | ICD-10-CM | POA: Diagnosis not present

## 2024-01-16 DIAGNOSIS — R5383 Other fatigue: Secondary | ICD-10-CM | POA: Insufficient documentation

## 2024-01-16 DIAGNOSIS — K5000 Crohn's disease of small intestine without complications: Secondary | ICD-10-CM

## 2024-01-16 DIAGNOSIS — R059 Cough, unspecified: Secondary | ICD-10-CM | POA: Insufficient documentation

## 2024-01-16 DIAGNOSIS — D649 Anemia, unspecified: Secondary | ICD-10-CM

## 2024-01-16 DIAGNOSIS — G4700 Insomnia, unspecified: Secondary | ICD-10-CM | POA: Diagnosis not present

## 2024-01-16 DIAGNOSIS — M542 Cervicalgia: Secondary | ICD-10-CM | POA: Insufficient documentation

## 2024-01-16 DIAGNOSIS — R531 Weakness: Secondary | ICD-10-CM | POA: Insufficient documentation

## 2024-01-16 DIAGNOSIS — N1832 Chronic kidney disease, stage 3b: Secondary | ICD-10-CM

## 2024-01-16 DIAGNOSIS — Z79899 Other long term (current) drug therapy: Secondary | ICD-10-CM | POA: Diagnosis not present

## 2024-01-16 DIAGNOSIS — N189 Chronic kidney disease, unspecified: Secondary | ICD-10-CM | POA: Diagnosis not present

## 2024-01-16 DIAGNOSIS — D61818 Other pancytopenia: Secondary | ICD-10-CM | POA: Diagnosis not present

## 2024-01-16 DIAGNOSIS — K648 Other hemorrhoids: Secondary | ICD-10-CM | POA: Insufficient documentation

## 2024-01-16 DIAGNOSIS — R634 Abnormal weight loss: Secondary | ICD-10-CM | POA: Insufficient documentation

## 2024-01-16 DIAGNOSIS — K209 Esophagitis, unspecified without bleeding: Secondary | ICD-10-CM | POA: Diagnosis not present

## 2024-01-16 LAB — COMPREHENSIVE METABOLIC PANEL WITH GFR
ALT: 20 U/L (ref 0–44)
AST: 26 U/L (ref 15–41)
Albumin: 3.9 g/dL (ref 3.5–5.0)
Alkaline Phosphatase: 115 U/L (ref 38–126)
Anion gap: 9 (ref 5–15)
BUN: 17 mg/dL (ref 8–23)
CO2: 26 mmol/L (ref 22–32)
Calcium: 9.7 mg/dL (ref 8.9–10.3)
Chloride: 105 mmol/L (ref 98–111)
Creatinine, Ser: 1.4 mg/dL — ABNORMAL HIGH (ref 0.44–1.00)
GFR, Estimated: 40 mL/min — ABNORMAL LOW (ref >60)
Glucose, Bld: 86 mg/dL (ref 70–99)
Potassium: 3.6 mmol/L (ref 3.5–5.1)
Sodium: 140 mmol/L (ref 135–145)
Total Bilirubin: 0.7 mg/dL (ref 0.0–1.2)
Total Protein: 7.5 g/dL (ref 6.5–8.1)

## 2024-01-16 LAB — CBC WITH DIFFERENTIAL/PLATELET
Abs Immature Granulocytes: 0.05 K/uL (ref 0.00–0.07)
Basophils Absolute: 0 K/uL (ref 0.0–0.1)
Basophils Relative: 1 %
Eosinophils Absolute: 0.2 K/uL (ref 0.0–0.5)
Eosinophils Relative: 3 %
HCT: 38.2 % (ref 36.0–46.0)
Hemoglobin: 13.3 g/dL (ref 12.0–15.0)
Immature Granulocytes: 1 %
Lymphocytes Relative: 40 %
Lymphs Abs: 2 K/uL (ref 0.7–4.0)
MCH: 33.4 pg (ref 26.0–34.0)
MCHC: 34.8 g/dL (ref 30.0–36.0)
MCV: 96 fL (ref 80.0–100.0)
Monocytes Absolute: 0.4 K/uL (ref 0.1–1.0)
Monocytes Relative: 8 %
Neutro Abs: 2.4 K/uL (ref 1.7–7.7)
Neutrophils Relative %: 47 %
Platelets: 151 K/uL (ref 150–400)
RBC: 3.98 MIL/uL (ref 3.87–5.11)
RDW: 12.4 % (ref 11.5–15.5)
WBC: 5 K/uL (ref 4.0–10.5)
nRBC: 0 % (ref 0.0–0.2)

## 2024-01-16 LAB — IRON AND TIBC
Iron: 76 ug/dL (ref 28–170)
Saturation Ratios: 28 % (ref 10.4–31.8)
TIBC: 270 ug/dL (ref 250–450)
UIBC: 194 ug/dL

## 2024-01-16 LAB — VITAMIN B12: Vitamin B-12: 1105 pg/mL — ABNORMAL HIGH (ref 180–914)

## 2024-01-16 LAB — FERRITIN: Ferritin: 342 ng/mL — ABNORMAL HIGH (ref 11–307)

## 2024-01-16 NOTE — Progress Notes (Signed)
 No injection needed per parameters. Patient's hemoglobin noted to be 13.3 today.

## 2024-01-16 NOTE — Progress Notes (Signed)
 [facility] [address], [place], [place] [address] Main: [phone] Fax: [phone]  Follow-up Visit    Patient Care Team: [name], MD as PCP - General (Internal Medicine) Alvan Dorn FALCON, MD as PCP - Cardiology (Cardiology) Melodi Lerner, MD as Consulting Physician (Orthopedic Surgery) Rogers Hai, MD as Medical Oncologist (Hematology) Parrett, Madelin RAMAN, NP as Nurse Practitioner (Pulmonary Disease)   Name of the patient: Jessica [name]  [id number]  Aug 09, 1950   Date of visit: 01/16/2024   Diagnosis- iron deficiency anemia, acute PE  History of Present Illness: Jessica [name] is a 73 year old female who is followed by us  for weakly provoked pulmonary embolus after traveling 3 hours from [place]  to [place] followed by a 2-hour flight from [place] to [place]  on 07/03/2023.  She was last seen in clinic on 10/16/2023.  Patient was evaluated in an ED on 10/15/23 for persistent coughing.  Had EGD on 10/07/2023 and called Dr. Samuel office who put her on Augmentin  for possible aspiration after EGD.  She took full course of treatment but did not feel any better.  Workup did not reveal any evidence of pneumonia but given she was not improving doxycycline  was added.  She was hospitalized from 10/18/2023 through 10/25/2023 for sepsis.  Patient was given IV antibiotics had upper endoscopy which showed gastritis and esophagitis and was started on a PPI.  She was taken off of Rinvoq  which she is on for Crohn's due to possibly causing some of the nausea and vomiting.  She received 1 unit of packed red blood cells but fecal occult was negative.  At discharge hemoglobin was 7.7.  Pancytopenia thought to be secondary to acute illness she continued Eliquis .  She was evaluated in the emergency room on 12/17/2023 for neck pain.  She received a dose of steroids and fentanyl  which was helpful.  She was discharged home on gabapentin  and instructed to follow-up  with Ortho or neurology.  She received 2 doses of Retacrit  10,000 units on 11/20/2023 and 12/04/2023.  She did not receive Retacrit  on 12/20/2023 due to hemoglobin of 11.4.   She is currently receiving B12 shots with her PCP.  She is receiving them weekly.  Reports she is slowly gaining her strength back from her hospitalization.  Her appetite has finally returned and she has gained some weight.  Continues to have some nausea and vomiting but overall much better.  She was taken off her Rinvoq  and started on Skyrizi  a few days ago.  She is tolerating Skyrizi  well at this point.   Observations/Objective: Review of Systems  Constitutional:  Positive for chills, malaise/fatigue and weight loss.  Gastrointestinal:  Positive for nausea and vomiting.  Neurological:  Positive for dizziness and weakness. Negative for headaches.  Psychiatric/Behavioral:  The patient has insomnia.    Physical Exam Constitutional:      Appearance: Normal appearance.  Cardiovascular:     Rate and Rhythm: Normal rate and regular rhythm.  Pulmonary:     Effort: Pulmonary effort is normal.     Breath sounds: Normal breath sounds.  Abdominal:     General: Bowel sounds are normal.     Palpations: Abdomen is soft.  Musculoskeletal:        General: No swelling. Normal range of motion.  Neurological:     Mental Status: She is alert and oriented to person, place, and time. Mental status is at baseline.    Assessment and Plan: 1. Iron deficiency anemia,  unspecified iron deficiency anemia type (Primary) -Receives intermittent Monoferric  for IDA last given on 07/23/2023. -Labs from 01/16/2024 show a hemoglobin of 13.3 (11.4).  WBC low at 5 (3.7).  Platelets WNL.  Differential is unremarkable.  Ferritin elevated at 342, B12 1105.  -Reports she is taking iron supplements daily. -Denies any bleeding per rectum.  -She does not need any additional IV iron at this time. -Anemia likely secondary to chronic inflammation from  Crohn's.  2. Pulmonary embolism, unspecified chronicity, unspecified pulmonary embolism type, unspecified whether acute cor pulmonale present [facility]) -Recently diagnosed with PE in January 2025. -Hypercoagulable workup from February 2025 was essentially unremarkable. -She will likely be on anticoagulation long-term given inflammation secondary to Crohn's disease. -Denies any problems with Eliquis  at this time.  3.  CKD -She is followed by Dr. Rachele. -Creatinine has been elevated since January 2018 ranging from normal to 2.09. -She received 2 doses of Retacrit  10,000 units on 11/20/23 and 12/04/2023. -Hemoglobin has improved to 13.3 as of today.  Retacrit  held on 12/18/2023 for hemoglobin of 11.4. -Recommend increasing the length of time between her injections.  Return to clinic in 1 month with labs and possible Retacrit  and in 3 months with labs possible Retacrit  and see provider.  4.  Crohn's disease: -Followed by Dr. Eartha. -Anemia likely secondary to chronic inflammation. -Most recent colonoscopy showed Crohn's disease with ileitis.  Nonbleeding internal hemorrhoids. - Reports she was recently switched from Rinvoq  to Skyrizi .  She has only had a few doses of the Skyrizi  but reports tolerating it okay.  She has had poor response to 2 other medications for Crohn's disease.  She is hoping this 1 does not cause any issues. - She is followed closely by Dr. Eartha.  5.  Leukopenia: -White count is 5.0 (6.0) Differential unremarkable. -Will continue to monitor.  Likely secondary to immunosuppression medication for Crohn's. -Will continue to monitor.  6.  Esophagitis/gastritis: -She is currently on a PPI. -Most recent EGD from 10/23/2023 while in the hospital showed acute gastritis and esophagitis without bleeding. -Symptoms have since improved.  Follow Up Instructions: Return to clinic every 4 weeks with labs and possible Retacrit  and in 12 weeks with labs possible Retacrit  and see  NP.   I discussed the assessment and treatment plan with the patient. The patient was provided an opportunity to ask questions and all were answered. The patient agreed with the plan and demonstrated an understanding of the instructions.   The patient was advised to call back or seek an in-person evaluation if the symptoms worsen or if the condition fails to improve as anticipated.  I spent 20 minutes dedicated to the care of this patient (face-to-face and non-face-to-face) on the date of the encounter to include what is described in the assessment and plan.    Delon FORBES Hope, NP

## 2024-01-19 ENCOUNTER — Other Ambulatory Visit: Payer: Self-pay | Admitting: *Deleted

## 2024-01-19 DIAGNOSIS — K7460 Unspecified cirrhosis of liver: Secondary | ICD-10-CM

## 2024-01-21 ENCOUNTER — Other Ambulatory Visit (HOSPITAL_COMMUNITY): Payer: Self-pay

## 2024-01-21 ENCOUNTER — Telehealth: Payer: Self-pay

## 2024-01-21 NOTE — Telephone Encounter (Signed)
 Pharmacy Patient Advocate Encounter    Per test claim: The current 56 day co-pay is, $0.00.  No PA needed at this time. This test claim was processed through [employer]- copay amounts may vary at other pharmacies due to pharmacy/plan contracts, or as the patient moves through the different stages of their insurance plan.

## 2024-01-22 ENCOUNTER — Inpatient Hospital Stay: Admitting: Oncology

## 2024-01-29 ENCOUNTER — Other Ambulatory Visit (HOSPITAL_COMMUNITY): Payer: Self-pay | Admitting: Gastroenterology

## 2024-01-29 MED ORDER — SKYRIZI 180 MG/1.2ML ~~LOC~~ SOCT
180.0000 mg | SUBCUTANEOUS | 6 refills | Status: AC
  Filled 2024-03-11: qty 1.2, fill #0
  Filled 2024-04-02: qty 1.2, 28d supply, fill #0

## 2024-02-03 ENCOUNTER — Encounter (INDEPENDENT_AMBULATORY_CARE_PROVIDER_SITE_OTHER): Admitting: Cardiology

## 2024-02-03 ENCOUNTER — Encounter: Payer: Self-pay | Admitting: Cardiology

## 2024-02-03 VITALS — BP 138/78 | HR 79 | Ht 62.0 in | Wt 141.2 lb

## 2024-02-03 DIAGNOSIS — R002 Palpitations: Secondary | ICD-10-CM

## 2024-02-03 DIAGNOSIS — Z79899 Other long term (current) drug therapy: Secondary | ICD-10-CM | POA: Diagnosis not present

## 2024-02-03 DIAGNOSIS — R0609 Other forms of dyspnea: Secondary | ICD-10-CM

## 2024-02-03 DIAGNOSIS — I129 Hypertensive chronic kidney disease with stage 1 through stage 4 chronic kidney disease, or unspecified chronic kidney disease: Secondary | ICD-10-CM | POA: Diagnosis not present

## 2024-02-03 DIAGNOSIS — I340 Nonrheumatic mitral (valve) insufficiency: Secondary | ICD-10-CM

## 2024-02-03 DIAGNOSIS — M7912 Myalgia of auxiliary muscles, head and neck: Secondary | ICD-10-CM | POA: Diagnosis not present

## 2024-02-03 DIAGNOSIS — D631 Anemia in chronic kidney disease: Secondary | ICD-10-CM | POA: Diagnosis not present

## 2024-02-03 DIAGNOSIS — N183 Chronic kidney disease, stage 3 unspecified: Secondary | ICD-10-CM | POA: Diagnosis not present

## 2024-02-03 DIAGNOSIS — Z79891 Long term (current) use of opiate analgesic: Secondary | ICD-10-CM | POA: Diagnosis not present

## 2024-02-03 DIAGNOSIS — G894 Chronic pain syndrome: Secondary | ICD-10-CM | POA: Diagnosis not present

## 2024-02-03 DIAGNOSIS — M47812 Spondylosis without myelopathy or radiculopathy, cervical region: Secondary | ICD-10-CM | POA: Diagnosis not present

## 2024-02-03 DIAGNOSIS — M47816 Spondylosis without myelopathy or radiculopathy, lumbar region: Secondary | ICD-10-CM | POA: Diagnosis not present

## 2024-02-03 DIAGNOSIS — M7918 Myalgia, other site: Secondary | ICD-10-CM | POA: Diagnosis not present

## 2024-02-03 DIAGNOSIS — Z01818 Encounter for other preprocedural examination: Secondary | ICD-10-CM | POA: Diagnosis not present

## 2024-02-03 NOTE — Progress Notes (Signed)
 Clinical Summary Jessica [name] is a 73 y.o.female seen today for follow up of the following meidcal problems.    1.Palpitations - can occur at rest or with activity - feeling of heart pounding. No other associated symptoms. Symptoms on and off throughout the day. - no coffee, rare sodas, occasional tea, no energy drinks, no EtOH - recent family stress   11/2021 3 day monitor: rare PACs, one runs SVT x 8 beats. Occasoinal PVCs (1.3%).  - some racing at times, about 2-3 times per week. Lasts just a few seconds. Overall tolerable   - can have some palpitatoins at times - occurs 3-4 times a week, lasts a few minutes. - no specific trigger - high levels of stress - no other associated symptoms - compliant with meds, just restarted toprol  25mg  daily.       2. Mitral regurgitation - severe MR from pcp note   02/2021 [facility] at [facility]: LVEF >55%, severe MR. LVIDs 2.6  03/2021 TEE: moderate MR  12/2021 echo: LVEF 55-60%, grade I dd, normal RV, mild to mod MR,   Jan 2025 echo: LVEF 55-60%, no WMAs, normal diastolic function, normal RV function, mild MR    3. DOE - DOE with walking just in from parking lot - no chest pains - some recent LE edema x 3-4 months progressing - had been chlothalidone nephrology stopped. Taking aldactone 25mg  daily.    03/2022 nuclear stress: no ischemia Jan 2025 echo: LVEF 55-60%, no WMAs, normal diastolic function, normal RV function, mild MR 09/2023 PFTs: benign 4.HTN - neprhology stopped thiaizde diuretic, started hydralazine - she is compliant with meds - home bp's 110s/60s         5.OSA - followed by Dr Jude - mixed compliance with cpap, has f/u next week     5. CKD 3 - followed by Dr Rachele   6.Hypothyroidism - last TSH was elevated from 12/2020, perhaps playing a role in her fatigue     7. Chronic fatigue     8. History of PE - diagnosed Jan 2025 - followed by heme/onc, duration of anticoagulation deferred to heme  9.  History of CVA  10. 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 HLD -  Past Medical History:  Diagnosis Date   Anxiety attack    Arthritis    Back pain, chronic    Chronic kidney disease    Crohn's colitis (HCC)    Depression (emotion)    Dysrhythmia    GERD (gastroesophageal reflux disease)    Hypertension    Hypothyroid    Iron deficiency anemia, unspecified    Nausea and vomiting 10/22/2023   Neuropathy    Sleep apnea    cpap is not working per patient - needs to find a new doctor - not used since 7/16 per pat    Stage 3 chronic kidney disease (HCC)    Vertigo    followed by Dr Kievie- in [place]      Allergies  Allergen Reactions   Entyvio  [Vedolizumab ] Shortness Of Breath and Nausea And Vomiting    Joint pain   Robaxin  [Methocarbamol ] Itching   Sulfa Antibiotics Itching    ALL SULFA DRUGS     Current Outpatient Medications  Medication Sig Dispense Refill   acetaminophen  (TYLENOL ) 325 MG tablet Take 2 tablets (650 mg total) by mouth every 6 (six) hours as needed  for mild pain (pain score 1-3), fever or headache (or Fever >/= 101).     Albuterol -Budesonide (AIRSUPRA ) 90-80 MCG/ACT AERO Inhale 2 puffs into the lungs every 8 (eight) hours.     ALPRAZolam  (XANAX ) 0.5 MG tablet Take 0.5 mg by mouth 2 (two) times daily as needed.     apixaban  (ELIQUIS ) 5 MG TABS tablet Take 1 tablet (5 mg total) by mouth 2 (two) times daily. 60 tablet 4   Azelastine HCl 137 MCG/SPRAY SOLN Place 2 sprays into both nostrils 2 (two) times daily as needed (Congestion/ Allergies).     baclofen (LIORESAL) 10 MG tablet Take 10 mg by mouth 2 (two) times daily as needed.     buPROPion  (WELLBUTRIN  XL) 300 MG 24 hr tablet Take 1 tablet (300 mg total) by mouth every morning. 30 tablet 2   calcitRIOL (ROCALTROL) 0.25 MCG capsule Take 0.25 mcg by mouth daily.     cyanocobalamin  (VITAMIN B12) 1000 MCG/ML injection Inject 1,000 mcg  into the muscle once a week.     cyclobenzaprine (FLEXERIL) 5 MG tablet Take 5 mg by mouth 3 (three) times daily as needed.     dicyclomine  (BENTYL ) 10 MG capsule TAKE 1 CAPSULE BY MOUTH TWICE DAILY BEFORE A MEAL 60 capsule 0   ferrous sulfate  325 (65 FE) MG EC tablet Take 1 tablet (325 mg total) by mouth daily with breakfast. 90 tablet 3   gabapentin  (NEURONTIN ) 300 MG capsule Take 1 capsule (300 mg total) by mouth 3 (three) times daily. 270 capsule 3   levothyroxine  (SYNTHROID ) 100 MCG tablet Take 100 mcg by mouth daily.     mesalamine  (APRISO ) 0.375 g 24 hr capsule Take 4 capsules (1.5 g total) by mouth daily. 120 capsule 2   methocarbamol  (ROBAXIN ) 500 MG tablet Take 500 mg by mouth 3 (three) times daily as needed.     Multiple Vitamin (MULTIVITAMIN WITH MINERALS) TABS tablet Take 1 tablet by mouth daily.     ondansetron  (ZOFRAN -ODT) 4 MG disintegrating tablet Take 1 tablet (4 mg total) by mouth every 8 (eight) hours as needed. 14 tablet 0   pantoprazole  (PROTONIX ) 40 MG tablet Take 1 tablet (40 mg total) by mouth daily. 30 tablet 1   [START ON 04/06/2024] Risankizumab -rzaa (SKYRIZI ) 180 MG/1.2ML SOCT Inject 180 mg into the skin every 8 (eight) weeks. 1.2 mL 6   [START ON 04/06/2024] Risankizumab -rzaa (SKYRIZI ) 180 MG/1.2ML SOCT Inject 180 mg into the skin every 8 (eight) weeks. Starting at week 12 on the week of 9/30 1.2 mL 6   rOPINIRole  (REQUIP ) 1 MG tablet Take 3 mg by mouth at bedtime.     sodium bicarbonate 650 MG tablet Take 650 mg by mouth 2 (two) times daily.     thiamine (VITAMIN B-1) 100 MG tablet Take 100 mg by mouth daily.     No current facility-administered medications for this visit.     Past Surgical History:  Procedure Laterality Date   ABDOMINAL HYSTERECTOMY     BIOPSY  06/08/2018   Procedure: BIOPSY;  Surgeon: Golda Claudis PENNER, MD;  Location: [facility];  Service: Endoscopy;;  (colon)   BIOPSY  04/01/2023   Procedure: BIOPSY;  Surgeon: Eartha Angelia Sieving, MD;   [facility]: [facility];  Service: Gastroenterology;;   CHOLECYSTECTOMY     COLON SURGERY     for Crohn's in the 80s in [place]    COLONOSCOPY N/A 03/11/2013   Procedure: COLONOSCOPY;  Surgeon: Claudis PENNER Golda, MD;  [facility];  Service: Endoscopy;  Laterality: N/A;  1200   COLONOSCOPY N/A 06/08/2018   Procedure: COLONOSCOPY;  Surgeon: Golda Claudis PENNER, MD;  [facility];  Service: Endoscopy;  Laterality: N/A;  8:25   COLONOSCOPY WITH PROPOFOL  N/A 04/01/2023   Procedure: COLONOSCOPY WITH PROPOFOL ;  Surgeon: Eartha Angelia Sieving, MD;  [facility];  Service: Gastroenterology;  Laterality: N/A;   ESOPHAGEAL BRUSHING  04/01/2023   Procedure: ESOPHAGEAL BRUSHING;  Surgeon: Eartha Angelia Sieving, MD;  [facility];  Service: Gastroenterology;;   ESOPHAGOGASTRODUODENOSCOPY N/A 10/07/2023   Procedure: EGD (ESOPHAGOGASTRODUODENOSCOPY);  Surgeon: Eartha Angelia, Sieving, MD;  [facility];  Service: Gastroenterology;  Laterality: N/A;  215[PM, ASA 3, pt knows to arrive at 6:15   ESOPHAGOGASTRODUODENOSCOPY N/A 10/23/2023   Procedure: EGD (ESOPHAGOGASTRODUODENOSCOPY);  Surgeon: Dianna Specking, MD;  Location: [facility];  Service: Gastroenterology;  Laterality: N/A;   ESOPHAGOGASTRODUODENOSCOPY (EGD) WITH PROPOFOL  N/A 04/01/2023   Procedure: ESOPHAGOGASTRODUODENOSCOPY (EGD) WITH PROPOFOL ;  Surgeon: Eartha Angelia Sieving, MD;  [facility];  Service: Gastroenterology;  Laterality: N/A;  1:00am;asa 3   fatty tumor removed     HARDWARE REMOVAL Right 08/07/2016   Procedure: HARDWARE REMOVAL;  Surgeon: Dempsey Moan, MD;  Location: [facility];  Service: Orthopedics;  Laterality: Right;   Hip replacement      Rt hip in 2010 in [place]   KNEE ARTHROSCOPY Left 03/29/2015   Procedure: ARTHROSCOPY LEFT KNEE WITH MENSICAL DEBRIDEMENT, chondroplasty;  Surgeon: Dempsey Moan, MD;  Location: [facility];  Service: Orthopedics;  Laterality: Left;    ORIF TIBIA PLATEAU Right 02/29/2016   Procedure: OPEN REDUCTION INTERNAL FIXATION (ORIF RIGHT  TIBIAL PLATEAU FRACTURE;  Surgeon: Dempsey Moan, MD;  Location: [facility];  Service: Orthopedics;  Laterality: Right;   POLYPECTOMY  04/01/2023   Procedure: POLYPECTOMY;  Surgeon: Eartha Angelia Sieving, MD;  [facility];  Service: Gastroenterology;;   TEE WITHOUT CARDIOVERSION N/A 03/27/2021   Procedure: TRANSESOPHAGEAL ECHOCARDIOGRAM (TEE);  Surgeon: Alvan Jessica FALCON, MD;  [facility];  Service: Endoscopy;  Laterality: N/A;   TOTAL KNEE ARTHROPLASTY Right 08/25/2017   Procedure: TOTAL RIGHT KNEE ARTHROPLASTY;  Surgeon: Moan Dempsey, MD;  Location: [facility];  Service: Orthopedics;  Laterality: Right;     Allergies  Allergen Reactions   Entyvio  [Vedolizumab ] Shortness Of Breath and Nausea And Vomiting    Joint pain   Robaxin  [Methocarbamol ] Itching   Sulfa Antibiotics Itching    ALL SULFA DRUGS      Family History  Problem Relation Age of Onset   Crohn's disease Brother    Depression Mother    Anxiety disorder Mother    Depression Sister    Aneurysm Father    Colon cancer Neg Hx      Social History Jessica [name] reports that she has never smoked. She has never used smokeless tobacco. Jessica [name] reports no history of alcohol  use.    Physical Examination Today's Vitals   02/03/24 1403  BP: 138/78  Pulse: 79  SpO2: 92%  Weight: 141 lb 3.2 oz (64 kg)  Height: 5' 2 (1.575 m)   Body mass index is 25.83 kg/m.  Gen: resting comfortably, no acute distress HEENT: no scleral icterus, pupils equal round and reactive, no palptable cervical adenopathy,  CV: RRR, no m/rg, no jvd Resp: Clear to auscultation bilaterally GI: abdomen is soft, non-tender, non-distended, normal bowel sounds, no hepatosplenomegaly MSK: extremities are warm, no edema.  Skin: warm, no rash Neuro:  no  focal deficits Psych: appropriate affect   Diagnostic Studies  03/2021  echo IMPRESSIONS     1. Left ventricular ejection fraction, by estimation, is 60 to 65%. The  left ventricle has normal function. The left ventricle has no regional  wall motion abnormalities.   2. Right ventricular systolic function is normal. The right ventricular  size is normal.   3. No left atrial/left atrial appendage thrombus was detected. The LAA  emptying velocity was 54 cm/s.   4. The MR vena contracta is 0.4 cm. . The mitral valve is abnormal.  Moderate mitral valve regurgitation. No evidence of mitral stenosis.   5. The aortic valve is tricuspid. Aortic valve regurgitation is not  visualized. No aortic stenosis is present.      11/2021 monitor 3 day monitor Rare supraventricular ectopy in the form of isolated PACs, couplets, triplets. One run of SVT 8 beats. Occasional ventricular ectopy in the form of isolated PVCs, couplets No reported symptoms     03/2022 nuclear stress The study is normal. The study is low risk.   No ST deviation was noted.   LV perfusion is normal. Small apical defect in resting and stress images with normal wall motion consistent with apical thinning.   Left ventricular function is normal. End diastolic cavity size is normal.   Jan 2025 echo 1. Left ventricular ejection fraction, by estimation, is 55 to 60%. The  left ventricle has normal function. The left ventricle has no regional  wall motion abnormalities. Left ventricular diastolic parameters were  normal.   2. Right ventricular systolic function is normal. The right ventricular  size is normal.   3. The mitral valve is abnormal. Mild mitral valve regurgitation. No  evidence of mitral stenosis.   4. The aortic valve is tricuspid. There is moderate calcification of the  aortic valve. There is moderate thickening of the aortic valve. Aortic  valve regurgitation is not visualized. No aortic stenosis is present.   5. The inferior vena cava is normal in size with greater than 50%   respiratory variability, suggesting right atrial pressure of 3 mmHg.     Assessment and Plan   1.Palpitations -heart monitor with primarily benign ectopy. - minimal symptoms, continue current meds at this time.    2. Mitral regurgitation - mild by most recent US  earlier this year, continue to monitor     3. DOE - benign echo and stress test, no further cardiac testing is planned - encouraged regular exercise        Jessica [name], M.D.

## 2024-02-03 NOTE — Patient Instructions (Signed)
 Medication Instructions:  Continue all current medications.   Labwork: none  Testing/Procedures: none  Follow-Up: 6 months   Any Other Special Instructions Will Be Listed Below (If Applicable).   If you need a refill on your cardiac medications before your next appointment, please call your pharmacy.

## 2024-02-05 DIAGNOSIS — I10 Essential (primary) hypertension: Secondary | ICD-10-CM | POA: Diagnosis not present

## 2024-02-09 ENCOUNTER — Other Ambulatory Visit: Payer: Self-pay | Admitting: Gastroenterology

## 2024-02-10 ENCOUNTER — Encounter: Attending: Gastroenterology | Admitting: Emergency Medicine

## 2024-02-10 VITALS — BP 138/69 | HR 78 | Temp 97.9°F | Resp 15

## 2024-02-10 DIAGNOSIS — K5080 Crohn's disease of both small and large intestine without complications: Secondary | ICD-10-CM | POA: Insufficient documentation

## 2024-02-10 MED ORDER — METHYLPREDNISOLONE SODIUM SUCC 125 MG IJ SOLR
125.0000 mg | Freq: Once | INTRAMUSCULAR | Status: AC
  Administered 2024-02-10: 125 mg via INTRAVENOUS

## 2024-02-10 MED ORDER — DIPHENHYDRAMINE HCL 25 MG PO CAPS
25.0000 mg | ORAL_CAPSULE | Freq: Once | ORAL | Status: AC
  Administered 2024-02-10: 25 mg via ORAL

## 2024-02-10 MED ORDER — SODIUM CHLORIDE 0.9 % IV SOLN
600.0000 mg | Freq: Once | INTRAVENOUS | Status: AC
  Administered 2024-02-10: 600 mg via INTRAVENOUS
  Filled 2024-02-10: qty 10

## 2024-02-10 NOTE — Progress Notes (Signed)
 Diagnosis: Crohn's Disease  Provider:  Kennedy Pfeiffer NP  Procedure: IV Infusion  IV Type: Peripheral, IV Location: right wrist  Skyrizi  (risankizumab -rzaa), Dose: 600 mg  Infusion Start Time: 1355  Infusion Stop Time: 1459  Post Infusion IV Care: Peripheral IV Discontinued  Discharge: Condition: Good, Destination: Home . AVS Declined  Performed by:  Delon ONEIDA Officer, RN

## 2024-02-11 DIAGNOSIS — M47816 Spondylosis without myelopathy or radiculopathy, lumbar region: Secondary | ICD-10-CM | POA: Diagnosis not present

## 2024-02-11 DIAGNOSIS — M47812 Spondylosis without myelopathy or radiculopathy, cervical region: Secondary | ICD-10-CM | POA: Diagnosis not present

## 2024-02-13 ENCOUNTER — Telehealth (HOSPITAL_COMMUNITY): Admitting: Psychiatry

## 2024-02-13 ENCOUNTER — Inpatient Hospital Stay: Attending: Hematology

## 2024-02-13 ENCOUNTER — Inpatient Hospital Stay

## 2024-02-13 ENCOUNTER — Other Ambulatory Visit: Payer: Self-pay | Admitting: Student

## 2024-02-13 ENCOUNTER — Encounter (HOSPITAL_COMMUNITY): Payer: Self-pay | Admitting: Psychiatry

## 2024-02-13 DIAGNOSIS — D509 Iron deficiency anemia, unspecified: Secondary | ICD-10-CM | POA: Diagnosis not present

## 2024-02-13 DIAGNOSIS — F331 Major depressive disorder, recurrent, moderate: Secondary | ICD-10-CM

## 2024-02-13 DIAGNOSIS — K7460 Unspecified cirrhosis of liver: Secondary | ICD-10-CM

## 2024-02-13 DIAGNOSIS — N1832 Chronic kidney disease, stage 3b: Secondary | ICD-10-CM

## 2024-02-13 DIAGNOSIS — Z01818 Encounter for other preprocedural examination: Secondary | ICD-10-CM

## 2024-02-13 LAB — CBC
HCT: 33.6 % — ABNORMAL LOW (ref 36.0–46.0)
Hemoglobin: 11.7 g/dL — ABNORMAL LOW (ref 12.0–15.0)
MCH: 33.4 pg (ref 26.0–34.0)
MCHC: 34.8 g/dL (ref 30.0–36.0)
MCV: 96 fL (ref 80.0–100.0)
Platelets: 120 K/uL — ABNORMAL LOW (ref 150–400)
RBC: 3.5 MIL/uL — ABNORMAL LOW (ref 3.87–5.11)
RDW: 13 % (ref 11.5–15.5)
WBC: 5 K/uL (ref 4.0–10.5)
nRBC: 0 % (ref 0.0–0.2)

## 2024-02-13 MED ORDER — ESCITALOPRAM OXALATE 10 MG PO TABS: 10.0000 mg | ORAL_TABLET | Freq: Every day | ORAL | 2 refills | Status: DC

## 2024-02-13 MED ORDER — ALPRAZOLAM 0.5 MG PO TABS: 0.5000 mg | ORAL_TABLET | Freq: Two times a day (BID) | ORAL | 2 refills | Status: DC | PRN

## 2024-02-13 MED ORDER — BUPROPION HCL ER (XL) 300 MG PO TB24: 300.0000 mg | ORAL_TABLET | ORAL | 2 refills | Status: DC

## 2024-02-13 NOTE — Progress Notes (Signed)
 No injection was needed today, patient's hemoglobin noted to be 11.7. Patient made aware and verbalized understanding.  Discharged from clinic ambulatory in stable condition. Alert and oriented x 3. F/U with [facility] as scheduled.

## 2024-02-13 NOTE — Progress Notes (Signed)
 Virtual Visit via Telephone Note  I connected with Jessica [name] on 02/13/24 at 10:00 AM EDT by telephone and verified that I am speaking with the correct person using two identifiers.  Location: Patient: home Provider: office   I discussed the limitations, risks, security and privacy concerns of performing an evaluation and management service by telephone and the availability of in person appointments. I also discussed with the patient that there may be a patient responsible charge related to this service. The patient expressed understanding and agreed to proceed.      I discussed the assessment and treatment plan with the patient. The patient was provided an opportunity to ask questions and all were answered. The patient agreed with the plan and demonstrated an understanding of the instructions.   The patient was advised to call back or seek an in-person evaluation if the symptoms worsen or if the condition fails to improve as anticipated.  I provided 20 minutes of non-face-to-face time during this encounter.   Barnie Gull, MD  [facility] MD/PA/NP OP Progress Note  02/13/2024 10:04 AM Jessica [name]  MRN:  [id number]  Chief Complaint:  Chief Complaint  Patient presents with   Depression   Anxiety   Follow-up   HPI: This patient is a 73 year old married white female who lives with her husband in [place].  She has 2 grown sons.  She retired as a Civil Service fast streamer.   The patient returns for follow-up after about 6 weeks regarding her depression and anxiety.  She states she is now on Skyrizi  and it is making her feel bad.  She is fatigued and tired all the time she feels like her system is going down.  She is actually at the hospital right now getting lab work done.  She states that she is been trying all these Biologics because her Crohn's disease is worse.  She does not have much appetite and has been having more cramping.  She states that all of this has made her more depressed and  anxious.  She does not think she has been taking the Xanax  and claims she does not have any although I sent it in last visit.  The patient denies thoughts of self-harm or suicide.  She is very fatigued during the day at and then has trouble sleeping at night.  I explained that she should take the Xanax  0.5 mg at bedtime to help with sleep and may take 1 during the day for anxiety.  The Wellbutrin  was working well by her report until she started Skyrizi  and started to feel bad again.  I also offered to add Lexapro  to her regimen which she agrees to. Visit Diagnosis:    ICD-10-CM   1. Major depressive disorder, recurrent episode, moderate (HCC)  F33.1       Past Psychiatric History: Previous outpatient treatment  Past Medical History:  Past Medical History:  Diagnosis Date   Anxiety attack    Arthritis    Back pain, chronic    Chronic kidney disease    Crohn's colitis (HCC)    Depression (emotion)    Dysrhythmia    GERD (gastroesophageal reflux disease)    Hypertension    Hypothyroid    Iron deficiency anemia, unspecified    Nausea and vomiting 10/22/2023   Neuropathy    Sleep apnea    cpap is not working per patient - needs to find a new doctor - not used since 7/16 per pat    Stage 3 chronic  kidney disease (HCC)    Vertigo    followed by Dr Dann- in [place]     Past Surgical History:  Procedure Laterality Date   ABDOMINAL HYSTERECTOMY     BIOPSY  06/08/2018   Procedure: BIOPSY;  Surgeon: Golda Claudis PENNER, MD;  [facility];  Service: Endoscopy;;  (colon)   BIOPSY  04/01/2023   Procedure: BIOPSY;  Surgeon: Eartha Angelia Sieving, MD;  [facility];  Service: Gastroenterology;;   CHOLECYSTECTOMY     COLON SURGERY     for Crohn's in the 80s in [place]    COLONOSCOPY N/A 03/11/2013   Procedure: COLONOSCOPY;  Surgeon: Claudis PENNER Golda, MD;  [facility];  Service: Endoscopy;  Laterality: N/A;  1200   COLONOSCOPY N/A 06/08/2018   Procedure:  COLONOSCOPY;  Surgeon: Golda Claudis PENNER, MD;  [facility];  Service: Endoscopy;  Laterality: N/A;  8:25   COLONOSCOPY WITH PROPOFOL  N/A 04/01/2023   Procedure: COLONOSCOPY WITH PROPOFOL ;  Surgeon: Eartha Angelia Sieving, MD;  [facility];  Service: Gastroenterology;  Laterality: N/A;   ESOPHAGEAL BRUSHING  04/01/2023   Procedure: ESOPHAGEAL BRUSHING;  Surgeon: Eartha Angelia Sieving, MD;  [facility];  Service: Gastroenterology;;   ESOPHAGOGASTRODUODENOSCOPY N/A 10/07/2023   Procedure: EGD (ESOPHAGOGASTRODUODENOSCOPY);  Surgeon: Eartha Angelia, Sieving, MD;  [facility];  Service: Gastroenterology;  Laterality: N/A;  215[PM, ASA 3, pt knows to arrive at 6:15   ESOPHAGOGASTRODUODENOSCOPY N/A 10/23/2023   Procedure: EGD (ESOPHAGOGASTRODUODENOSCOPY);  Surgeon: Dianna Specking, MD;  Location: [facility];  Service: Gastroenterology;  Laterality: N/A;   ESOPHAGOGASTRODUODENOSCOPY (EGD) WITH PROPOFOL  N/A 04/01/2023   Procedure: ESOPHAGOGASTRODUODENOSCOPY (EGD) WITH PROPOFOL ;  Surgeon: Eartha Angelia Sieving, MD;  [facility];  Service: Gastroenterology;  Laterality: N/A;  1:00am;asa 3   fatty tumor removed     HARDWARE REMOVAL Right 08/07/2016   Procedure: HARDWARE REMOVAL;  Surgeon: Dempsey Moan, MD;  [facility]: [facility];  Service: Orthopedics;  Laterality: Right;   Hip replacement      Rt hip in 2010 in [place]   KNEE ARTHROSCOPY Left 03/29/2015   Procedure: ARTHROSCOPY LEFT KNEE WITH MENSICAL DEBRIDEMENT, chondroplasty;  Surgeon: Dempsey Moan, MD;  [facility];  Service: Orthopedics;  Laterality: Left;   ORIF TIBIA PLATEAU Right 02/29/2016   Procedure: OPEN REDUCTION INTERNAL FIXATION (ORIF RIGHT  TIBIAL PLATEAU FRACTURE;  Surgeon: Dempsey Moan, MD;  [facility];  Service: Orthopedics;  Laterality: Right;   POLYPECTOMY  04/01/2023   Procedure: POLYPECTOMY;  Surgeon: Eartha Angelia Sieving, MD;  [facility];   Service: Gastroenterology;;   TEE WITHOUT CARDIOVERSION N/A 03/27/2021   Procedure: TRANSESOPHAGEAL ECHOCARDIOGRAM (TEE);  Surgeon: Alvan Dorn FALCON, MD;  [facility];  Service: Endoscopy;  Laterality: N/A;   TOTAL KNEE ARTHROPLASTY Right 08/25/2017   Procedure: TOTAL RIGHT KNEE ARTHROPLASTY;  Surgeon: Moan Dempsey, MD;  [facility];  Service: Orthopedics;  Laterality: Right;    Family Psychiatric History: See below  Family History:  Family History  Problem Relation Age of Onset   Crohn's disease Brother    Depression Mother    Anxiety disorder Mother    Depression Sister    Aneurysm Father    Colon cancer Neg Hx     Social History:  Social History   Socioeconomic History   Marital status: Married    Spouse name: Not on file   Number of children: Not on file   Years of education: Not  on file   Highest education level: Not on file  Occupational History   Not on file  Tobacco Use   Smoking status: Never   Smokeless tobacco: Never  Vaping Use   Vaping status: Never Used  Substance and Sexual Activity   Alcohol  use: No    Alcohol /week: 0.0 standard drinks of alcohol    Drug use: No   Sexual activity: Not Currently  Other Topics Concern   Not on file  Social History Narrative   Not on file   Social Drivers of Health   Financial Resource Strain: Not on file  Food Insecurity: No Food Insecurity (10/19/2023)   Hunger Vital Sign    Worried About Running Out of Food in the Last Year: Never true    Ran Out of Food in the Last Year: Never true  Transportation Needs: No Transportation Needs (10/19/2023)   PRAPARE - Administrator, Civil Service (Medical): No    Lack of Transportation (Non-Medical): No  Physical Activity: Not on file  Stress: Not on file  Social Connections: Socially Integrated (07/14/2023)   Social Connection and Isolation Panel    Frequency of Communication with Friends and Family: More than three times a week    Frequency of  Social Gatherings with Friends and Family: More than three times a week    Attends Religious Services: More than 4 times per year    Active Member of [employer] or Organizations: Not on file    Attends Banker Meetings: More than 4 times per year    Marital Status: Married    Allergies:  Allergies  Allergen Reactions   Entyvio  [Vedolizumab ] Shortness Of Breath and Nausea And Vomiting    Joint pain   Robaxin  [Methocarbamol ] Itching   Sulfa Antibiotics Itching    ALL SULFA DRUGS    Metabolic Disorder Labs: Lab Results  Component Value Date   HGBA1C 5.1 07/02/2022   No results found for: PROLACTIN Lab Results  Component Value Date   CHOL 109 10/07/2023   TRIG 120 10/07/2023   HDL 42 10/07/2023   CHOLHDL 2.6 10/07/2023   VLDL 24 10/07/2023   LDLCALC 43 10/07/2023   LDLCALC 97 07/02/2022   Lab Results  Component Value Date   TSH 0.438 10/23/2023   TSH 0.331 (L) 10/22/2023    Therapeutic Level Labs: No results found for: LITHIUM No results found for: VALPROATE No results found for: CBMZ  Current Medications: Current Outpatient Medications  Medication Sig Dispense Refill   escitalopram  (LEXAPRO ) 10 MG tablet Take 1 tablet (10 mg total) by mouth daily. 30 tablet 2   acetaminophen  (TYLENOL ) 325 MG tablet Take 2 tablets (650 mg total) by mouth every 6 (six) hours as needed for mild pain (pain score 1-3), fever or headache (or Fever >/= 101).     Albuterol -Budesonide (AIRSUPRA ) 90-80 MCG/ACT AERO Inhale 2 puffs into the lungs every 8 (eight) hours.     ALPRAZolam  (XANAX ) 0.5 MG tablet Take 1 tablet (0.5 mg total) by mouth 2 (two) times daily as needed. 60 tablet 2   apixaban  (ELIQUIS ) 5 MG TABS tablet Take 1 tablet (5 mg total) by mouth 2 (two) times daily. 60 tablet 4   Azelastine HCl 137 MCG/SPRAY SOLN Place 2 sprays into both nostrils 2 (two) times daily as needed (Congestion/ Allergies).     baclofen (LIORESAL) 10 MG tablet Take 10 mg by mouth 2 (two)  times daily as needed.     buPROPion  (WELLBUTRIN  XL) 300  MG 24 hr tablet Take 1 tablet (300 mg total) by mouth every morning. 30 tablet 2   calcitRIOL (ROCALTROL) 0.25 MCG capsule Take 0.25 mcg by mouth daily.     cyanocobalamin  (VITAMIN B12) 1000 MCG/ML injection Inject 1,000 mcg into the muscle once a week.     cyclobenzaprine (FLEXERIL) 5 MG tablet Take 5 mg by mouth 3 (three) times daily as needed.     dicyclomine  (BENTYL ) 10 MG capsule TAKE 1 CAPSULE BY MOUTH TWICE DAILY BEFORE A MEAL 60 capsule 0   ferrous sulfate  325 (65 FE) MG EC tablet Take 1 tablet (325 mg total) by mouth daily with breakfast. 90 tablet 3   gabapentin  (NEURONTIN ) 300 MG capsule Take 1 capsule (300 mg total) by mouth 3 (three) times daily. 270 capsule 3   levothyroxine  (SYNTHROID ) 100 MCG tablet Take 100 mcg by mouth daily.     mesalamine  (APRISO ) 0.375 g 24 hr capsule Take 4 capsules (1.5 g total) by mouth daily. 120 capsule 2   methocarbamol  (ROBAXIN ) 500 MG tablet Take 500 mg by mouth 3 (three) times daily as needed.     metoprolol  succinate (TOPROL -XL) 25 MG 24 hr tablet Take 25 mg by mouth daily.     Multiple Vitamin (MULTIVITAMIN WITH MINERALS) TABS tablet Take 1 tablet by mouth daily.     ondansetron  (ZOFRAN -ODT) 4 MG disintegrating tablet Take 1 tablet (4 mg total) by mouth every 8 (eight) hours as needed. 14 tablet 0   pantoprazole  (PROTONIX ) 40 MG tablet Take 1 tablet (40 mg total) by mouth daily. 30 tablet 1   [START ON 04/06/2024] Risankizumab -rzaa (SKYRIZI ) 180 MG/1.2ML SOCT Inject 180 mg into the skin every 8 (eight) weeks. 1.2 mL 6   [START ON 04/06/2024] Risankizumab -rzaa (SKYRIZI ) 180 MG/1.2ML SOCT Inject 180 mg into the skin every 8 (eight) weeks. Starting at week 12 on the week of 9/30 1.2 mL 6   rOPINIRole  (REQUIP ) 1 MG tablet Take 3 mg by mouth at bedtime.     sodium bicarbonate 650 MG tablet Take 650 mg by mouth 2 (two) times daily.     thiamine (VITAMIN B-1) 100 MG tablet Take 100 mg by mouth daily.      thiamine (VITAMIN B1) 100 MG tablet Take 100 mg by mouth daily.     tiZANidine (ZANAFLEX) 4 MG tablet Take 4 mg by mouth daily as needed.     No current facility-administered medications for this visit.     Musculoskeletal: Strength & Muscle Tone: na Gait & Station: na Patient leans: N/A  Psychiatric Specialty Exam: Review of Systems  Constitutional:  Positive for fatigue.  Gastrointestinal:  Positive for abdominal pain.  Psychiatric/Behavioral:  Positive for dysphoric mood. The patient is nervous/anxious.   All other systems reviewed and are negative.   There were no vitals taken for this visit.There is no height or weight on file to calculate BMI.  General Appearance: NA  Eye Contact:  NA  Speech:  Clear and Coherent  Volume:  Normal  Mood:  Anxious and Dysphoric  Affect:  NA  Thought Process:  Goal Directed  Orientation:  Full (Time, Place, and Person)  Thought Content: Rumination   Suicidal Thoughts:  No  Homicidal Thoughts:  No  Memory:  Immediate;   Good Recent;   Good Remote;   NA  Judgement:  Good  Insight:  Good  Psychomotor Activity:  Decreased  Concentration:  Concentration: Fair and Attention Span: Fair  Recall:  Good  Fund of Knowledge:  Good  Language: Good  Akathisia:  No  Handed:  Right  AIMS (if indicated): not done  Assets:  Communication Skills Desire for Improvement Resilience Social Support  ADL's:  Intact  Cognition: WNL  Sleep:  Fair   Screenings: GAD-7    Garment/textile technologist Visit from 09/30/2022 in [facility] [place] Office Visit from 08/23/2021 in [facility] at [facility]  Total GAD-7 Score 4 14   PHQ2-9    Flowsheet Row Clinical Support from 02/10/2024 in [facility] at [facility] Visit from 01/16/2024 in [facility] [place] Clinical Support from 01/13/2024 in [facility] at [facility] Clinical Support from 05/21/2023 in [facility] at [facility] Visit from 09/30/2022 in [facility] at [facility] Total Score 0 0 0 0 0  PHQ-9 Total Score -- -- -- -- 3   Flowsheet Row ED from 12/17/2023 in [facility] Emergency Department at [facility] ED to Hosp-Admission (Discharged) from 10/18/2023 in [facility] ED from 10/15/2023 in [facility] Emergency Department at [facility]  C-SSRS RISK CATEGORY No Risk No Risk No Risk     Assessment and Plan: This patient is a 73 year old female with history depression anxiety chronic kidney disease, liver cirrhosis Crohn's disease and recent side effects from Biologics.  She is not feeling that well on the Skyrizi  but this will have to be discussed with her GI specialist.  We did increase to the Wellbutrin  to 300 mg XL daily and we will also add Lexapro  10 mg daily to help with depression and anxiety.  She was reminded to use the Xanax  0.5 mg twice daily as needed for anxiety.  She will return to see me in 4 weeks  Collaboration of Care: Collaboration of Care: Other provider involved in patient's care AEB notes are shared with all specialist on the epic system  Patient/Guardian was advised Release of Information must be obtained prior to any record release in order to collaborate their care with an outside provider. Patient/Guardian was advised if they have not already done so to contact the registration department to sign all necessary forms in order for us  to release information regarding their care.   Consent: Patient/Guardian gives verbal consent for treatment and assignment of benefits for services provided during this visit. Patient/Guardian expressed understanding and agreed to proceed.    Barnie Gull, MD 02/13/2024, 10:04 AM

## 2024-02-16 ENCOUNTER — Other Ambulatory Visit: Payer: Self-pay

## 2024-02-16 ENCOUNTER — Ambulatory Visit (HOSPITAL_COMMUNITY)
Admission: RE | Admit: 2024-02-16 | Discharge: 2024-02-16 | Disposition: A | Discharge status: Home / Self Care | Source: Ambulatory Visit | Attending: Gastroenterology | Admitting: Gastroenterology

## 2024-02-16 DIAGNOSIS — K7460 Unspecified cirrhosis of liver: Secondary | ICD-10-CM | POA: Diagnosis not present

## 2024-02-16 DIAGNOSIS — K760 Fatty (change of) liver, not elsewhere classified: Secondary | ICD-10-CM | POA: Diagnosis not present

## 2024-02-16 DIAGNOSIS — G4733 Obstructive sleep apnea (adult) (pediatric): Secondary | ICD-10-CM | POA: Diagnosis not present

## 2024-02-16 DIAGNOSIS — I129 Hypertensive chronic kidney disease with stage 1 through stage 4 chronic kidney disease, or unspecified chronic kidney disease: Secondary | ICD-10-CM | POA: Diagnosis not present

## 2024-02-16 DIAGNOSIS — N183 Chronic kidney disease, stage 3 unspecified: Secondary | ICD-10-CM | POA: Insufficient documentation

## 2024-02-16 DIAGNOSIS — Z9049 Acquired absence of other specified parts of digestive tract: Secondary | ICD-10-CM | POA: Insufficient documentation

## 2024-02-16 DIAGNOSIS — R300 Dysuria: Secondary | ICD-10-CM | POA: Insufficient documentation

## 2024-02-16 DIAGNOSIS — K754 Autoimmune hepatitis: Secondary | ICD-10-CM | POA: Diagnosis not present

## 2024-02-16 DIAGNOSIS — Z8379 Family history of other diseases of the digestive system: Secondary | ICD-10-CM | POA: Diagnosis not present

## 2024-02-16 DIAGNOSIS — K5080 Crohn's disease of both small and large intestine without complications: Secondary | ICD-10-CM | POA: Diagnosis not present

## 2024-02-16 DIAGNOSIS — K7581 Nonalcoholic steatohepatitis (NASH): Secondary | ICD-10-CM | POA: Diagnosis not present

## 2024-02-16 DIAGNOSIS — E039 Hypothyroidism, unspecified: Secondary | ICD-10-CM | POA: Diagnosis not present

## 2024-02-16 DIAGNOSIS — Z7901 Long term (current) use of anticoagulants: Secondary | ICD-10-CM | POA: Insufficient documentation

## 2024-02-16 DIAGNOSIS — Z86711 Personal history of pulmonary embolism: Secondary | ICD-10-CM | POA: Diagnosis not present

## 2024-02-16 DIAGNOSIS — K7400 Hepatic fibrosis, unspecified: Secondary | ICD-10-CM | POA: Diagnosis not present

## 2024-02-16 DIAGNOSIS — Z01818 Encounter for other preprocedural examination: Secondary | ICD-10-CM

## 2024-02-16 LAB — CBC
HCT: 32.9 % — ABNORMAL LOW (ref 36.0–46.0)
Hemoglobin: 11.6 g/dL — ABNORMAL LOW (ref 12.0–15.0)
MCH: 32.8 pg (ref 26.0–34.0)
MCHC: 35.3 g/dL (ref 30.0–36.0)
MCV: 92.9 fL (ref 80.0–100.0)
Platelets: 75 K/uL — ABNORMAL LOW (ref 150–400)
RBC: 3.54 MIL/uL — ABNORMAL LOW (ref 3.87–5.11)
RDW: 13 % (ref 11.5–15.5)
WBC: 5.4 K/uL (ref 4.0–10.5)
nRBC: 0 % (ref 0.0–0.2)

## 2024-02-16 LAB — PROTIME-INR
INR: 0.9 (ref 0.8–1.2)
Prothrombin Time: 13.1 s (ref 11.4–15.2)

## 2024-02-16 MED ORDER — GELATIN ABSORBABLE 12-7 MM EX MISC
1.0000 | Freq: Once | CUTANEOUS | Status: AC
  Administered 2024-02-16 (×2): 1 via TOPICAL

## 2024-02-16 MED ORDER — MIDAZOLAM HCL 2 MG/2ML IJ SOLN
INTRAMUSCULAR | Status: AC | PRN
  Administered 2024-02-16 (×4): 1 mg via INTRAVENOUS

## 2024-02-16 MED ORDER — FENTANYL CITRATE (PF) 100 MCG/2ML IJ SOLN
INTRAMUSCULAR | Status: AC | PRN
  Administered 2024-02-16 (×4): 50 ug via INTRAVENOUS

## 2024-02-16 MED ORDER — MIDAZOLAM HCL 2 MG/2ML IJ SOLN
INTRAMUSCULAR | Status: AC
  Filled 2024-02-16: qty 2

## 2024-02-16 MED ORDER — FENTANYL CITRATE (PF) 100 MCG/2ML IJ SOLN
INTRAMUSCULAR | Status: AC
Start: 2024-02-16 — End: 2024-02-16
  Filled 2024-02-16: qty 2

## 2024-02-16 MED ORDER — SODIUM CHLORIDE 0.9 % IV SOLN: INTRAVENOUS | Status: DC

## 2024-02-16 MED ORDER — LIDOCAINE HCL (PF) 1 % IJ SOLN
9.0000 mL | Freq: Once | INTRAMUSCULAR | Status: AC
  Administered 2024-02-16 (×2): 9 mL via INTRADERMAL

## 2024-02-16 NOTE — Telephone Encounter (Signed)
 I was unable to find information regarding holding Eliquis . It looks in the appt notes that it states ok to hold eliquis  48 hours prior.  Patient sent mychart message asking if she needed to hold Eliquis . I am unsure if she did this or not. I have replied back to her.  Please discuss with Charmaine when she returns, especially if biopsy has to be rescheduled.

## 2024-02-16 NOTE — Progress Notes (Signed)
 Called Dr Hughes to check when to restart client's eliquis  and per Dr Hughes client and her husband notified to restart eliquis  tomorrow and client and her husband voiced understanding

## 2024-02-16 NOTE — H&P (Signed)
 Chief Complaint: Request for liver biopsy  Referring Provider(s): Mahon,Courtney L   Supervising Physician: Vanice Revel  Patient Status: [facility] - Out-pt  History of Present Illness: Jessica [name] is a 73 y.o. female with a history of ileocolonic Crohn's disease s/p right hemicolectomy, history of pinhole fistula in 2019, HTN, chronic GERD, hypothyroidism, CKD stage III, OSA, PE (eliquis ) and arthritis. She is followed by [facility]. IR consulted for liver biopsy as part of cirrhosis workup. 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   Confirms NPO since MN and ride/supervision available for 24 hours.  Denies fever, chills, SOB, CP, sore throat, N/V, blood in stool, abnormal bruising, leg swelling, back pain. Patient reports chronic dysuria/intermittent hematuria (none noted today).   Allergies Reviewed:  Entyvio  [vedolizumab ], Robaxin  [methocarbamol ], and Sulfa antibiotics   Patient is Full Code  Past Medical History:  Diagnosis Date   Anxiety attack    Arthritis    Back pain, chronic    Chronic kidney disease    Crohn's colitis (HCC)    Depression (emotion)    Dysrhythmia    GERD (gastroesophageal reflux disease)    Hypertension    Hypothyroid    Iron deficiency anemia, unspecified    Nausea and vomiting 10/22/2023   Neuropathy    Sleep apnea    cpap is not working per patient - needs to find a new doctor - not used since 7/16 per pat    Stage 3 chronic kidney disease (HCC)    Vertigo    followed by Dr Kievie- in [place]     Past Surgical History:  Procedure Laterality Date   ABDOMINAL HYSTERECTOMY     BIOPSY  06/08/2018   Procedure: BIOPSY;  Surgeon: Golda Claudis PENNER, MD;  [facility];  Service: Endoscopy;;  (colon)   BIOPSY  04/01/2023   Procedure: BIOPSY;  Surgeon: Eartha Angelia Sieving, MD;  [facility]: [facility];  Service: Gastroenterology;;   CHOLECYSTECTOMY     COLON SURGERY     for  Crohn's in the 80s in [place]    COLONOSCOPY N/A 03/11/2013   Procedure: COLONOSCOPY;  Surgeon: Claudis PENNER Golda, MD;  [facility]: [facility];  Service: Endoscopy;  Laterality: N/A;  1200   COLONOSCOPY N/A 06/08/2018   Procedure: COLONOSCOPY;  Surgeon: Golda Claudis PENNER, MD;  [facility];  Service: Endoscopy;  Laterality: N/A;  8:25   COLONOSCOPY WITH PROPOFOL  N/A 04/01/2023   Procedure: COLONOSCOPY WITH PROPOFOL ;  Surgeon: Eartha Angelia Sieving, MD;  [facility];  Service: Gastroenterology;  Laterality: N/A;   ESOPHAGEAL BRUSHING  04/01/2023   Procedure: ESOPHAGEAL BRUSHING;  Surgeon: Eartha Angelia Sieving, MD;  [facility];  Service: Gastroenterology;;   ESOPHAGOGASTRODUODENOSCOPY N/A 10/07/2023   Procedure: EGD (ESOPHAGOGASTRODUODENOSCOPY);  Surgeon: Eartha Angelia, Sieving, MD;  [facility];  Service: Gastroenterology;  Laterality: N/A;  215[PM, ASA 3, pt knows to arrive at 6:15   ESOPHAGOGASTRODUODENOSCOPY N/A 10/23/2023   Procedure: EGD (ESOPHAGOGASTRODUODENOSCOPY);  Surgeon: Dianna Specking, MD;  Location: [facility];  Service: Gastroenterology;  Laterality: N/A;   ESOPHAGOGASTRODUODENOSCOPY (EGD) WITH PROPOFOL  N/A 04/01/2023   Procedure: ESOPHAGOGASTRODUODENOSCOPY (EGD) WITH PROPOFOL ;  Surgeon: Eartha Angelia Sieving, MD;  [facility];  Service: Gastroenterology;  Laterality: N/A;  1:00am;asa 3   fatty tumor removed     HARDWARE REMOVAL Right 08/07/2016   Procedure: HARDWARE REMOVAL;  Surgeon: Dempsey Moan, MD;  Location: [facility];  Service:  Orthopedics;  Laterality: Right;   Hip replacement      Rt hip in 2010 in [place]   KNEE ARTHROSCOPY Left 03/29/2015   Procedure: ARTHROSCOPY LEFT KNEE WITH MENSICAL DEBRIDEMENT, chondroplasty;  Surgeon: Dempsey Moan, MD;  Location: [facility];  Service: Orthopedics;  Laterality: Left;   ORIF TIBIA PLATEAU Right 02/29/2016   Procedure: OPEN REDUCTION INTERNAL FIXATION (ORIF RIGHT   TIBIAL PLATEAU FRACTURE;  Surgeon: Dempsey Moan, MD;  Location: [facility];  Service: Orthopedics;  Laterality: Right;   POLYPECTOMY  04/01/2023   Procedure: POLYPECTOMY;  Surgeon: Eartha Angelia Sieving, MD;  [facility];  Service: Gastroenterology;;   TEE WITHOUT CARDIOVERSION N/A 03/27/2021   Procedure: TRANSESOPHAGEAL ECHOCARDIOGRAM (TEE);  Surgeon: Alvan Dorn FALCON, MD;  [facility];  Service: Endoscopy;  Laterality: N/A;   TOTAL KNEE ARTHROPLASTY Right 08/25/2017   Procedure: TOTAL RIGHT KNEE ARTHROPLASTY;  Surgeon: Moan Dempsey, MD;  Location: [facility];  Service: Orthopedics;  Laterality: Right;      Medications: Prior to Admission medications   Medication Sig Start Date End Date Taking? Authorizing Provider  acetaminophen  (TYLENOL ) 325 MG tablet Take 2 tablets (650 mg total) by mouth every 6 (six) hours as needed for mild pain (pain score 1-3), fever or headache (or Fever >/= 101). 07/16/23  Yes Emokpae, Courage, MD  ALPRAZolam  (XANAX ) 0.5 MG tablet Take 1 tablet (0.5 mg total) by mouth 2 (two) times daily as needed. 02/13/24  Yes Okey Barnie SAUNDERS, MD  Azelastine HCl 137 MCG/SPRAY SOLN Place 2 sprays into both nostrils 2 (two) times daily as needed (Congestion/ Allergies). 07/17/23  Yes [provider]  baclofen (LIORESAL) 10 MG tablet Take 10 mg by mouth 2 (two) times daily as needed. 12/09/23  Yes [provider]  buPROPion  (WELLBUTRIN  XL) 300 MG 24 hr tablet Take 1 tablet (300 mg total) by mouth every morning. 02/13/24 02/12/25 Yes Okey Barnie SAUNDERS, MD  calcitRIOL (ROCALTROL) 0.25 MCG capsule Take 0.25 mcg by mouth daily. 10/26/23  Yes [provider]  cyanocobalamin  (VITAMIN B12) 1000 MCG/ML injection Inject 1,000 mcg into the muscle once a week.   Yes [provider]  cyclobenzaprine (FLEXERIL) 5 MG tablet Take 5 mg by mouth 3 (three) times daily as needed. 12/06/23  Yes [provider]  dicyclomine  (BENTYL ) 10 MG capsule TAKE 1  CAPSULE BY MOUTH TWICE DAILY BEFORE A MEAL 02/09/24  Yes Eartha Angelia, Sieving, MD  ferrous sulfate  325 (65 FE) MG EC tablet Take 1 tablet (325 mg total) by mouth daily with breakfast. 07/16/23  Yes Emokpae, Courage, MD  gabapentin  (NEURONTIN ) 300 MG capsule Take 1 capsule (300 mg total) by mouth 3 (three) times daily. 12/22/23  Yes Hyatt, Max T, DPM  levothyroxine  (SYNTHROID ) 100 MCG tablet Take 100 mcg by mouth daily. 01/09/22  Yes [provider]  mesalamine  (APRISO ) 0.375 g 24 hr capsule Take 4 capsules (1.5 g total) by mouth daily. 12/04/23  Yes Eartha Angelia Sieving, MD  methocarbamol  (ROBAXIN ) 500 MG tablet Take 500 mg by mouth 3 (three) times daily as needed. 12/15/23  Yes [provider]  metoprolol  succinate (TOPROL -XL) 25 MG 24 hr tablet Take 25 mg by mouth daily. 01/13/24  Yes [provider]  Multiple Vitamin (MULTIVITAMIN WITH MINERALS) TABS tablet Take 1 tablet by mouth daily.   Yes [provider]  pantoprazole  (PROTONIX ) 40 MG tablet Take 1 tablet (40 mg total) by mouth daily. 01/06/24  Yes Kennedy Charmaine CROME, NP  Risankizumab -rzaa (SKYRIZI ) 180  MG/1.2ML SOCT Inject 180 mg into the skin every 8 (eight) weeks. 04/06/24  Yes Mahon, Courtney L, NP  rOPINIRole  (REQUIP ) 1 MG tablet Take 3 mg by mouth at bedtime. 03/18/23  Yes [provider]  sodium bicarbonate 650 MG tablet Take 650 mg by mouth 2 (two) times daily.   Yes [provider]  thiamine (VITAMIN B-1) 100 MG tablet Take 100 mg by mouth daily.   Yes [provider]  Albuterol -Budesonide (AIRSUPRA ) 90-80 MCG/ACT AERO Inhale 2 puffs into the lungs every 8 (eight) hours. 07/22/23   Jude Harden GAILS, MD  apixaban  (ELIQUIS ) 5 MG TABS tablet Take 1 tablet (5 mg total) by mouth 2 (two) times daily. 08/13/23   Pearlean Manus, MD  escitalopram  (LEXAPRO ) 10 MG tablet Take 1 tablet (10 mg total) by mouth daily. 02/13/24 02/12/25  Okey Barnie SAUNDERS, MD  ondansetron  (ZOFRAN -ODT) 4 MG disintegrating  tablet Take 1 tablet (4 mg total) by mouth every 8 (eight) hours as needed. 10/25/23   Will Almarie MATSU, MD  Risankizumab -rzaa (SKYRIZI ) 180 MG/1.2ML SOCT Inject 180 mg into the skin every 8 (eight) weeks. Starting at week 12 on the week of 9/30 04/06/24   Kennedy Charmaine CROME, NP  thiamine (VITAMIN B1) 100 MG tablet Take 100 mg by mouth daily. 11/27/23 11/21/24  [provider]  tiZANidine (ZANAFLEX) 4 MG tablet Take 4 mg by mouth daily as needed. 12/24/23 02/22/24  [provider]     Family History  Problem Relation Age of Onset   Crohn's disease Brother    Depression Mother    Anxiety disorder Mother    Depression Sister    Aneurysm Father    Colon cancer Neg Hx     Social History   Socioeconomic History   Marital status: Married    Spouse name: Not on file   Number of children: Not on file   Years of education: Not on file   Highest education level: Not on file  Occupational History   Not on file  Tobacco Use   Smoking status: Never   Smokeless tobacco: Never  Vaping Use   Vaping status: Never Used  Substance and Sexual Activity   Alcohol  use: No    Alcohol /week: 0.0 standard drinks of alcohol    Drug use: No   Sexual activity: Not Currently  Other Topics Concern   Not on file  Social History Narrative   Not on file   Social Drivers of Health   Financial Resource Strain: Not on file  Food Insecurity: No Food Insecurity (10/19/2023)   Hunger Vital Sign    Worried About Running Out of Food in the Last Year: Never true    Ran Out of Food in the Last Year: Never true  Transportation Needs: No Transportation Needs (10/19/2023)   PRAPARE - Administrator, Civil Service (Medical): No    Lack of Transportation (Non-Medical): No  Physical Activity: Not on file  Stress: Not on file  Social Connections: Socially Integrated (07/14/2023)   Social Connection and Isolation Panel    Frequency of Communication with Friends and Family: More than three  times a week    Frequency of Social Gatherings with Friends and Family: More than three times a week    Attends Religious Services: More than 4 times per year    Active Member of [employer] or Organizations: Not on file    Attends Banker Meetings: More than 4 times per year    Marital  Status: Married     Review of Systems: A 12 point ROS discussed and pertinent positives are indicated in the HPI above.  All other systems are negative.   Vital Signs: BP (!) 141/73 (BP Location: Right Arm)   Pulse 65   Temp 97.7 F (36.5 C) (Oral)   Resp 17   Ht 5' 2 (1.575 m)   Wt 142 lb (64.4 kg)   SpO2 100%   BMI 25.97 kg/m    Physical Exam HENT:     Mouth/Throat:     Mouth: Mucous membranes are moist.     Pharynx: Oropharynx is clear.  Cardiovascular:     Rate and Rhythm: Normal rate and regular rhythm.     Pulses: Normal pulses.  Pulmonary:     Effort: Pulmonary effort is normal.     Breath sounds: Normal breath sounds.  Abdominal:     General: There is no distension.     Palpations: Abdomen is soft.     Tenderness: There is abdominal tenderness.     Comments: Epigastric tenderness. No rash or wounds over planned puncture site, well healed RUQ surgical scar  Musculoskeletal:     Right lower leg: No edema.     Left lower leg: No edema.  Skin:    General: Skin is warm and dry.     Capillary Refill: Capillary refill takes less than 2 seconds.  Neurological:     General: No focal deficit present.     Mental Status: She is alert and oriented to person, place, and time.     Comments: Hard of hearing  Psychiatric:        Mood and Affect: Mood normal.        Behavior: Behavior normal.        Thought Content: Thought content normal.        Judgment: Judgment normal.     Imaging: No results found.  Labs:  CBC: Recent Labs    12/04/23 1025 12/18/23 0913 01/16/24 0806 02/13/24 0928  WBC 3.5* 6.2 5.0 5.0  HGB 9.9* 11.4* 13.3 11.7*  HCT 30.8* 33.7* 38.2 33.6*   PLT 154 153 151 120*    COAGS: Recent Labs    10/18/23 1430 10/24/23 0140 10/24/23 1207 10/24/23 2057 10/25/23 0733 01/14/24 1445 02/16/24 1235  INR 1.4*  --   --   --   --  1.1 0.9  APTT  --  152* 65* 115* 95*  --   --     BMP: Recent Labs    10/24/23 0140 10/25/23 0733 11/17/23 1017 01/14/24 1445 01/16/24 0806  NA 133* 133* 136 144 140  K 3.4* 3.7 3.7 3.7 3.6  CL 110 111 110 107* 105  CO2 17* 18* 21* 23 26  GLUCOSE 87 91 83 85 86  BUN 8 7* 7* 19 17  CALCIUM  7.6* 7.7* 8.2*  8.1* 9.2 9.7  CREATININE 1.23* 1.06* 1.10* 1.34* 1.40*  GFRNONAA 47* 56* 54*  --  40*    LIVER FUNCTION TESTS: Recent Labs    10/24/23 0140 10/25/23 0733 11/17/23 1017 01/14/24 1445 01/16/24 0806  BILITOT 1.1 0.7  --  0.3 0.7  AST 34 37  --  22 26  ALT 20 21  --  16 20  ALKPHOS 73 81  --  152* 115  PROT 4.7* 4.7*  --  6.8 7.5  ALBUMIN  2.7* 2.6* 2.7* 4.1 3.9    TUMOR MARKERS: No results for input(s): AFPTM, CEA, CA199, CHROMGRNA in the  last 8760 hours.  Assessment and Plan:  Request for  image guided random liver biopsy approved for 8/11 with Dr. Vanice No contraindications for procedure identified in ROS, physical exam, or review of pre-sedation considerations. Labs reviewed and within acceptable range 10/18/23 CT imaging available and reviewed VSS, afebrile. BP improved to 141/73 from 162/64 since arriving. She has taken her oral antihypertensive medication today.  She confirms she has held her Eliquis  for the past 2 days Abx not indicated   Risks and benefits of liver biopsy was discussed with the patient and/or patient's family including, but not limited to bleeding, infection, damage to adjacent structures or low yield requiring additional tests.  All of the questions were answered and there is agreement to proceed.  Consent signed and in chart.   Thank you for allowing our service to participate in KAREEMA KEITT 's care.    Electronically Signed: Laymon Coast, NP   02/16/2024, 12:54 PM     I spent a total of  15 Minutes   in face to face in clinical consultation, greater than 50% of which was counseling/coordinating care for image guided liver biopsy.   (A copy of this note was sent to the referring provider and the time of visit.)

## 2024-02-16 NOTE — Telephone Encounter (Signed)
 We don't get clearance for liver bx. Whoever in central scheduling that is working on the order get's it if needed.

## 2024-02-16 NOTE — Procedures (Signed)
 Interventional Radiology Procedure Note  Procedure: US  RT LIVER RANDOM CORE BX    Complications: None  Estimated Blood Loss:  MIN  Findings: 11 G CORE X 2    M. FREDERIC SPECKING, MD

## 2024-02-19 DIAGNOSIS — M47816 Spondylosis without myelopathy or radiculopathy, lumbar region: Secondary | ICD-10-CM | POA: Diagnosis not present

## 2024-02-19 DIAGNOSIS — M47812 Spondylosis without myelopathy or radiculopathy, cervical region: Secondary | ICD-10-CM | POA: Diagnosis not present

## 2024-02-19 LAB — SURGICAL PATHOLOGY

## 2024-02-21 ENCOUNTER — Other Ambulatory Visit: Payer: Self-pay | Admitting: Oncology

## 2024-02-23 ENCOUNTER — Ambulatory Visit: Payer: Self-pay | Admitting: Gastroenterology

## 2024-02-23 DIAGNOSIS — M47816 Spondylosis without myelopathy or radiculopathy, lumbar region: Secondary | ICD-10-CM | POA: Diagnosis not present

## 2024-02-23 DIAGNOSIS — M47812 Spondylosis without myelopathy or radiculopathy, cervical region: Secondary | ICD-10-CM | POA: Diagnosis not present

## 2024-02-25 DIAGNOSIS — M47816 Spondylosis without myelopathy or radiculopathy, lumbar region: Secondary | ICD-10-CM | POA: Diagnosis not present

## 2024-02-25 DIAGNOSIS — M47812 Spondylosis without myelopathy or radiculopathy, cervical region: Secondary | ICD-10-CM | POA: Diagnosis not present

## 2024-02-26 ENCOUNTER — Ambulatory Visit: Admitting: Gastroenterology

## 2024-02-26 ENCOUNTER — Encounter: Payer: Self-pay | Admitting: Gastroenterology

## 2024-02-26 VITALS — BP 122/63 | HR 84 | Temp 98.0°F | Ht 62.0 in | Wt 138.0 lb

## 2024-02-26 DIAGNOSIS — K50919 Crohn's disease, unspecified, with unspecified complications: Secondary | ICD-10-CM

## 2024-02-26 DIAGNOSIS — R112 Nausea with vomiting, unspecified: Secondary | ICD-10-CM

## 2024-02-26 DIAGNOSIS — K529 Noninfective gastroenteritis and colitis, unspecified: Secondary | ICD-10-CM | POA: Diagnosis not present

## 2024-02-26 DIAGNOSIS — D61818 Other pancytopenia: Secondary | ICD-10-CM

## 2024-02-26 DIAGNOSIS — K7460 Unspecified cirrhosis of liver: Secondary | ICD-10-CM

## 2024-02-26 DIAGNOSIS — R519 Headache, unspecified: Secondary | ICD-10-CM

## 2024-02-26 DIAGNOSIS — D508 Other iron deficiency anemias: Secondary | ICD-10-CM

## 2024-02-26 DIAGNOSIS — K219 Gastro-esophageal reflux disease without esophagitis: Secondary | ICD-10-CM

## 2024-02-26 DIAGNOSIS — K50019 Crohn's disease of small intestine with unspecified complications: Secondary | ICD-10-CM

## 2024-02-26 MED ORDER — DICYCLOMINE HCL 10 MG PO CAPS: 10.0000 mg | ORAL_CAPSULE | Freq: Two times a day (BID) | ORAL | 2 refills | Status: DC | PRN

## 2024-02-26 MED ORDER — LOPERAMIDE HCL 2 MG PO CAPS: 2.0000 mg | ORAL_CAPSULE | ORAL | 2 refills | Status: DC | PRN

## 2024-02-26 MED ORDER — ONDANSETRON 4 MG PO TBDP
4.0000 mg | ORAL_TABLET | Freq: Three times a day (TID) | ORAL | 0 refills | Status: AC | PRN
Start: 2024-02-26 — End: ?

## 2024-02-26 MED ORDER — PANTOPRAZOLE SODIUM 40 MG PO TBEC: 40.0000 mg | DELAYED_RELEASE_TABLET | Freq: Every day | ORAL | 3 refills | Status: AC

## 2024-02-26 NOTE — Progress Notes (Unsigned)
 GI Office Note    Referring Provider: [name], MD Primary Care Physician:  [name], MD Primary Gastroenterologist: Toribio Rubins, MD  Date:  02/26/2024  ID:  Jessica [name], DOB 01-24-1951, MRN [id number]   Chief Complaint   Chief Complaint  Patient presents with   Follow-up    Follow up. Go over results    History of Present Illness  Jessica [name] is a 73 y.o. 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   Colonoscopy 06/08/2018: - The examined portion of the ileum was normal.  - Mucosal ulceration. Biopsied.  - Colonic fistula.  - Internal hemorrhoids. -pathology: small reactive changes but no active disease -Repeat colonoscopy in 5 years.  -Patient requested to not go any therapy unless she relapses.   Office visit 01/30/21.  Doing well.  Heartburn controlled pantoprazole .  Willing to drop her dose in the future due to risk of osteopenia.  Unsure when due for next bone density.  Denied any dysphagia, hoarseness, sore throat, chronic cough.  Has occasional diarrhea and accidents but mostly she has 2 formed stools.  Stop dicyclomine .  Denied any melena or bright BPR.  Appetite good.    Last office visit 12/26/2022.  Some incontinence of urine and feces.  PCP recommended she be seen due to anemia.  Taking oral iron supplement daily.  Denied melena or BRBPR.  Stools normal though she is not having 5 to 6/day and usually moderate amount.  Taking Imodium  once daily and Bentyl  1-2 times daily.  Gets constipated sometimes with taking iron.  Due to see Dr. Rachele for her kidneys.  Still having fatigue.  Some mild dysphagia with food being stuck in her mid chest region, especially bread.  Using CPAP at night   Colonoscopy 04/01/23: -Crohn's disease with ileitis, congestion/edema, erosions,  erythema found in the neoterminal ileum Rtugeerts Score i1 -Associated narrowing of the lumen s/p biopsy -Patent end-to-side colocolonic anastomosis with healthy-appearing mucosa -Three 4-12 mm polyps in the transverse colon -Nonbleeding internal hemorrhoids -Advised to check TB and hepatitis B surface antigen in anticipation of treatment with Stelara /Entyvio /Skyrizi  -Pathology with ileal mucosa with focal erosion and active neutrophilic inflammation (NSAID versus Crohn's).  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     EGD 04/01/23: -LA grade C esophagitis with no bleeding s/p biopsy -KOH brushings obtained -4cm hiatal hernia -Normal stomach -Normal duodenum -GE junction biopsy with inflamed squamous mucosa with features consistent with severe reflux.  No yeast   Hep B surface antigen -neg. 04/01/2023.  TB Gold negative   OV 04/15/23.  Still having some fatigue related to anemia.  Had an episode of emesis after eating [place] food, also with spicy.  She is taking her pantoprazole  twice daily.  Having some mild dysphagia with solid food still.  Having bowel movements after every meal, usually 3/day.  Taking Imodium  and dicyclomine  which helps with her abdominal pain.  Does have associated arthritis. Up-to-date on COVID-vaccine and boosters as well as flu shot.  Shingles vaccine in October 2020.   Stelara  and Skyrizi  were sent in as options for treatment of Crohn's however given large co-pay for both of these, there was discussion about Entyvio  versus Remicade, given the higher risk of malignancy with Remicade, a shared decision was made with the patient to proceed with Entyvio .   After  receiving a couple doses of Entyvio  patient developed severe joint pain therefore this was discontinued.  Since her last visit she has had 2 visits to [facility] with 1 admission.  Had an ED visit for pneumonia as well as a hospitalization secondary to pulmonary embolism.    EGD was scheduled for  07/18/2023, this was canceled and need to ensure healing/management of PE prior to rescheduling EGD.   Seen by pulmonology 07/22/2023.  Having chronic cough, chest CT ordered.  Advised to continue Eliquis  for PE. Will follow up in 1 month.    Received Monoferric  infusion 07/23/2023.    OV 08/14/2023. Still with some complaints of fatigue as well as baseline joint pain and some muscle pain.  Denied any melena or BRBPR.  Reportedly only having 1 bowel movement daily and had not been taking Imodium , more constipated since her hospitalization.  Having to strain and not having left abdominal pain.  Reported some reflux issues at night prior but not having frequent issues.  Takes pantoprazole  daily with occasional missed doses.  Had been taking her Eliquis  and still reporting dyspnea on exertion.  Asked continue daily iron.  Discussed continuing Entyvio  versus Remicade versus resubmitting for Stelara , she was advised to let us  know what she decided to do.  Advised to start MiraLAX  for constipation and hold Imodium  unless having significant diarrhea.  Scheduled for EGD with Dr. Eartha after getting pulmonary and cardiology clearance.  Continue PPI BID.    Received message from Dr. Eartha that we should try Rinvoq  for her Crohn's.  This was ultimately submitted.  Decision was made to go forward with Remicade if unable to afford Rinvoq .    EGD 10/07/2023: - White nummular lesions in esophageal mucosa. Biopsied.  - A medium amount of food ( residue) in the stomach.  - Normal examined duodenum. - Advised to stop omeprazole  and start pantoprazole . - Change her Rinvoq  to 30 mg daily - Path: No significant diagnostic alteration of esophageal tissue   She reported significant cough after her EGD, there was concern for aspiration pneumonia therefore antibiotics sent in for her.  Given her pancytopenia with normalization of CRP and lipid panel dark as needed advised recheck CBC and CMP in a week.   CT C/A/P  10/15/23:  - Nodular hepatic contour - Scattered calcifications consistent with sequelae of prior granulomatous disease - No focal liver lesions - Small hiatal hernia - No acute intrathoracic, intra-abdominal, or intrapelvic abnormality   Admitted to [facility] 10/18/23 after she presented with persistent nausea and vomiting.  She was seen by GI at [facility] and underwent EGD below.  She requested restart Apriso  and stop her Rinvoq  given potential side effects including her pancytopenia.  Underwent CT scan as noted below.   CT A/P without contrast 10/18/2023: - Scattered calcifications noted within the liver stable from prior exam, mild nodularity noted, stable from prior. - Evidence of cholecystectomy - Normal-appearing pancreas - Scattered granulomas within the spleen, small accessory splenule - Postsurgical changes of right colon consistent with prior resection - No evidence of small bowel obstruction, normal stomach   EGD 10/23/2023: - Acute gastritis s/p biopsy - Grade a reflux esophagitis without bleeding - Medium size hiatal hernia - Normal duodenum - Path: Reactive gastropathy with foveolar hyperplasia, negative for metaplasia, or dysplasia.  Negative H. pylori   OV 12/04/23 with Dr. Eartha.  Patient discussed that she was very fearful regarding any medications for Crohn's disease given she wants to avoid having more unwanted side  effects.  She was reportedly taking 2 Imodium  pills every other day and 1 pill every other day and had been having 1-2 bowel movements per day that were soft in nature.  Denied any melena or hematochezia.  Did endorse some abdominal pain occasionally, some nausea at times.  She reports she has lost about 20 pounds since her symptoms started.  No jaundice or pruritus.  She was advised to read about medications for Crohn's disease including Tremfya, Skyrizi , Stelara , Remicade, and Humira.  Advise continue Apriso /mesalamine  for now.  I discussed for 1 hour the  importance of treating her disease to avoid further complications.  She stated she would discuss which medication she wanted to try at her next appointment.  Plans to discuss imaging findings of cirrhosis at upcoming appointment.   Previous Crohn's medications: Pentasa  (not achieving endoscopic remission), Entyvio  (joint pain?, DVT/PE? Although most likely thromboemolitic in nature from travel), and Rinvoq  (N/V)   Last office visit 01/06/24. Having BM 2-3 times daily with bentyl  and imodium  once daily and take an additional dose of imodium  if needed. Had been on mesalamine  since discharge. GERD controlled. No sign of decompensation related to cirrhosis. Advised RUQ ultrasound in October.  Check labs including CBC, CMP, INR, AFP, ASMA, AMA, immunoglobulins, viral hepatitis panel given concern for cirrhosis on CT in April.  Submitted for Skyrizi  for treatment of cirrhosis, continuing dicyclomine  and Imodium  for now along with mesalamine  until induction of Skyrizi  completed.  Continue pantoprazole  40 mg daily.  Labs 01/14/2024: ASMA 49, AMA 53.7, normal IgG and IgM.  ANA negative.  INR 1.1.  Sodium 144, creatinine 1.34, albumin  4.1, alk phos 152, AST 22, ALT 16, T. bili 0.3.  Viral hepatitis panel negative.  aFP 2.1.  01/16/2024 labs:  hemoglobin 13.3, platelets 151, alk phos 115, T. bili 0.7, AST 26, ALT 20, creatinine 1.4, sodium 140, albumin  3.9.  Iron 76, saturation 28%, ferritin 342, B12 high at 1105  On 01/16/2024 I spoke with the patient in detail regarding the results of her lab work over the phone.  She had elevated alkaline phosphatase as well as positive AMA and ASMA and we discussed the possibilities of PBC, autoimmune hepatitis, and her prior concern for cirrhosis.  I had had a discussion with Dr. Eartha who also recommend a liver biopsy to further determine if she has cirrhosis and potential Kolls.  We discussed this in detail at that time and she was agreeable to proceed with liver  biopsy.  Underwent liver biopsy on 02/16/2024: -  Cirrhosis with evidence of regression in the background of apparent  mild steatohepatitis and changes . Bridging fibrosis with areas of pericellular/perisinusoidal fibrosis  indicative of a previous steatohepatitis in addition many of these areas  of bridging fibrosis show attenuation and thinning suggestive of possible regression. The hepatic parenchyma shows minimal steatosis but only approximately 5% which shows abundant glycogenation of the nuclei as well as scattered ballooning degeneration as well as a very rare apoptotic body consistent with a mild ongoing damage.  The portal tracts have a minimal amount of chronic inflammation as well as periportal histiocytosis consistent of previous damage.  There is also a bile ductular proliferation consistent with the presence of cirrhosis.  There is no evidence of cholestasis, bile duct damage/injury or periductular fibrosis.  Granulomas are not identified.  Plasma cells are not a prominent feature of the mild chronic inflammation.  Eosinophils are also not a major component of the inflammation.  An iron stain shows mild amount  of Kupffer cell iron as well as minimal [name] hepatocyte iron.  A reticulin stain shows overall normal plate thickness.  A PAS stain is unremarkable.  A CK7 stain highlights the bile ductular proliferation with some bile duct loss of uncertain clinical significance.  Overall while there is evidence of cirrhosis, it has the slight appearance of regression.  An underlying etiology is not definitively morphologically identified; however, given the pattern of fibrosis, presence of ballooning degeneration, glycogenated nuclei and rare steatosis and underlying steatohepatitis is favored other etiologies could include drug-induced liver injury amongst others   Discussed these liver biopsy results with Dr. Eartha who feels as though this could be combination of Dili versus steatohepatitis  however given her positive autoimmune markers he agrees with my initial plan for referral to hepatology to have a discussion on if this is overlapping disease.  Would likely not change to her management very much other than potential addition of ursodiol although on recent labs her alkaline phosphatase has normalized.  Received Skyrizi  on 7/8 and 8/5. Upcoming infusion for last dose induction on 03/09/24. Should start SQ injection on 9/30.   Today:  Discussed the use of AI scribe software for clinical note transcription with the patient, who gave verbal consent to proceed.  She is concerned about her recent liver biopsy results, which confirmed the presence of cirrhosis. Her MELD score is between 10 and 12. She is interested in dietary and lifestyle changes to manage her condition. She has a history of high blood pressure, which was not managed for many years, and wonders if her past prednisone  use might have contributed to her current liver condition. She also has a history of taking various medications over the years, which she suspects may have impacted her liver health.  She has a history of Crohn's disease and has been experiencing increased diarrhea and abdominal cramping, with diarrhea occurring three to four times a day at times, sometimes with urgency. She takes dicyclomine  once a day and loperamide  as needed, but she is almost out of her prescription. Typically takes this about once daily. She is also experiencing nausea and vomiting, which she attributes to taking medications on an empty stomach.  She is currently on Skyrizi  for her Crohn's disease and has experienced headaches and vomiting, which she suspects may be related to the medication. Reports this occurred with Rinvoq  as well but more frequently. The vomiting occurs a few weeks after the infusion. She takes Zofran  at the onset of nausea to prevent vomiting. She has admitted to spacing out her daily medications and admits to taking these  on an empty stomach.   She reports a preference for sweet foods and has been consuming a lot of candy and ice cream, which may not be beneficial for her health. She has been experiencing issues with taste, noting that sweet flavors are more appealing while savory and salty foods are less so. This has impacted her eating habits, leading to weight fluctuations. She focuses on protein intake and has been consuming eggs and considering [place] bacon as alternatives to regular bacon.  There is a concern about potential autoimmune hepatitis or primary biliary cholangitis (PBC) due to positive smooth muscle and mitochondrial antibodies which was discussed, and questions were asked. We discussed that further lab tests may be needed to clarify these findings. She stated she was agreeable to see transplant hepatology.   Her mother developed diabetes in her early 61. She has been experiencing significant stress and anxiety over the past  eight to nine months. No recent yellowing of the skin or eyes. NO pruritus or hepatic encephalopathy. No significant increase in abdominal girth or peripheral edema.    Wt Readings from Last 10 Encounters:  02/26/24 138 lb (62.6 kg)  02/16/24 142 lb (64.4 kg)  02/03/24 141 lb 3.2 oz (64 kg)  01/16/24 140 lb 9.6 oz (63.8 kg)  01/06/24 143 lb 6.4 oz (65 kg)  12/17/23 142 lb 6.3 oz (64.6 kg)  12/04/23 142 lb 6.4 oz (64.6 kg)  10/19/23 150 lb 5.7 oz (68.2 kg)  10/15/23 155 lb (70.3 kg)  10/07/23 155 lb (70.3 kg)    Current Outpatient Medications  Medication Sig Dispense Refill   acetaminophen  (TYLENOL ) 325 MG tablet Take 2 tablets (650 mg total) by mouth every 6 (six) hours as needed for mild pain (pain score 1-3), fever or headache (or Fever >/= 101).     Albuterol -Budesonide (AIRSUPRA ) 90-80 MCG/ACT AERO Inhale 2 puffs into the lungs every 8 (eight) hours.     ALPRAZolam  (XANAX ) 0.5 MG tablet Take 1 tablet (0.5 mg total) by mouth 2 (two) times daily as needed. 60  tablet 2   apixaban  (ELIQUIS ) 5 MG TABS tablet Take 1 tablet (5 mg total) by mouth 2 (two) times daily. 60 tablet 4   baclofen (LIORESAL) 10 MG tablet Take 10 mg by mouth 2 (two) times daily as needed.     buPROPion  (WELLBUTRIN  XL) 300 MG 24 hr tablet Take 1 tablet (300 mg total) by mouth every morning. 30 tablet 2   calcitRIOL (ROCALTROL) 0.25 MCG capsule Take 0.25 mcg by mouth daily.     dicyclomine  (BENTYL ) 10 MG capsule TAKE 1 CAPSULE BY MOUTH TWICE DAILY BEFORE A MEAL 60 capsule 0   escitalopram  (LEXAPRO ) 10 MG tablet Take 1 tablet (10 mg total) by mouth daily. 30 tablet 2   metoprolol  succinate (TOPROL -XL) 25 MG 24 hr tablet Take 25 mg by mouth daily.     rosuvastatin  (CRESTOR ) 10 MG tablet Take 10 mg by mouth daily.     Azelastine HCl 137 MCG/SPRAY SOLN Place 2 sprays into both nostrils 2 (two) times daily as needed (Congestion/ Allergies).     cyanocobalamin  (VITAMIN B12) 1000 MCG/ML injection Inject 1,000 mcg into the muscle once a week.     cyclobenzaprine (FLEXERIL) 5 MG tablet Take 5 mg by mouth 3 (three) times daily as needed.     ferrous sulfate  325 (65 FE) MG EC tablet Take 1 tablet (325 mg total) by mouth daily with breakfast. 90 tablet 3   gabapentin  (NEURONTIN ) 300 MG capsule Take 1 capsule (300 mg total) by mouth 3 (three) times daily. 270 capsule 3   levothyroxine  (SYNTHROID ) 100 MCG tablet Take 100 mcg by mouth daily.     mesalamine  (APRISO ) 0.375 g 24 hr capsule Take 4 capsules (1.5 g total) by mouth daily. 120 capsule 2   methocarbamol  (ROBAXIN ) 500 MG tablet Take 500 mg by mouth 3 (three) times daily as needed.     Multiple Vitamin (MULTIVITAMIN WITH MINERALS) TABS tablet Take 1 tablet by mouth daily.     ondansetron  (ZOFRAN -ODT) 4 MG disintegrating tablet Take 1 tablet (4 mg total) by mouth every 8 (eight) hours as needed. 14 tablet 0   pantoprazole  (PROTONIX ) 40 MG tablet Take 1 tablet (40 mg total) by mouth daily. 30 tablet 1   [START ON 04/06/2024] Risankizumab -rzaa  (SKYRIZI ) 180 MG/1.2ML SOCT Inject 180 mg into the skin every 8 (eight) weeks. 1.2 mL 6   [  START ON 04/06/2024] Risankizumab -rzaa (SKYRIZI ) 180 MG/1.2ML SOCT Inject 180 mg into the skin every 8 (eight) weeks. Starting at week 12 on the week of 9/30 1.2 mL 6   rOPINIRole  (REQUIP ) 1 MG tablet Take 3 mg by mouth at bedtime.     sodium bicarbonate 650 MG tablet Take 650 mg by mouth 2 (two) times daily.     thiamine (VITAMIN B-1) 100 MG tablet Take 100 mg by mouth daily.     thiamine (VITAMIN B1) 100 MG tablet Take 100 mg by mouth daily.     No current facility-administered medications for this visit.    Past Medical History:  Diagnosis Date   Anxiety attack    Arthritis    Back pain, chronic    Chronic kidney disease    Crohn's colitis (HCC)    Depression (emotion)    Dysrhythmia    GERD (gastroesophageal reflux disease)    Hypertension    Hypothyroid    Iron deficiency anemia, unspecified    Nausea and vomiting 10/22/2023   Neuropathy    Sleep apnea    cpap is not working per patient - needs to find a new doctor - not used since 7/16 per pat    Stage 3 chronic kidney disease (HCC)    Vertigo    followed by Dr Kievie- in [place]     Past Surgical History:  Procedure Laterality Date   ABDOMINAL HYSTERECTOMY     BIOPSY  06/08/2018   Procedure: BIOPSY;  Surgeon: Golda Claudis PENNER, MD;  [facility]: [facility];  Service: Endoscopy;;  (colon)   BIOPSY  04/01/2023   Procedure: BIOPSY;  Surgeon: Eartha Flavors, Toribio, MD;  [facility]: [facility];  Service: Gastroenterology;;   CHOLECYSTECTOMY     COLON SURGERY     for Crohn's in the 80s in [place]    COLONOSCOPY N/A 03/11/2013   Procedure: COLONOSCOPY;  Surgeon: Claudis PENNER Golda, MD;  [facility]: [facility];  Service: Endoscopy;  Laterality: N/A;  1200   COLONOSCOPY N/A 06/08/2018   Procedure: COLONOSCOPY;  Surgeon: Golda Claudis PENNER, MD;  [facility];  Service: Endoscopy;  Laterality: N/A;  8:25   COLONOSCOPY WITH PROPOFOL   N/A 04/01/2023   Procedure: COLONOSCOPY WITH PROPOFOL ;  Surgeon: Eartha Flavors Toribio, MD;  [facility]: [facility];  Service: Gastroenterology;  Laterality: N/A;   ESOPHAGEAL BRUSHING  04/01/2023   Procedure: ESOPHAGEAL BRUSHING;  Surgeon: Eartha Flavors Toribio, MD;  [facility];  Service: Gastroenterology;;   ESOPHAGOGASTRODUODENOSCOPY N/A 10/07/2023   Procedure: EGD (ESOPHAGOGASTRODUODENOSCOPY);  Surgeon: Eartha Flavors, Toribio, MD;  [facility]: [facility];  Service: Gastroenterology;  Laterality: N/A;  215[PM, ASA 3, pt knows to arrive at 6:15   ESOPHAGOGASTRODUODENOSCOPY N/A 10/23/2023   Procedure: EGD (ESOPHAGOGASTRODUODENOSCOPY);  Surgeon: Dianna Specking, MD;  Location: [facility];  Service: Gastroenterology;  Laterality: N/A;   ESOPHAGOGASTRODUODENOSCOPY (EGD) WITH PROPOFOL  N/A 04/01/2023   Procedure: ESOPHAGOGASTRODUODENOSCOPY (EGD) WITH PROPOFOL ;  Surgeon: Eartha Flavors Toribio, MD;  [facility];  Service: Gastroenterology;  Laterality: N/A;  1:00am;asa 3   fatty tumor removed     HARDWARE REMOVAL Right 08/07/2016   Procedure: HARDWARE REMOVAL;  Surgeon: Dempsey Moan, MD;  Location: [facility];  Service: Orthopedics;  Laterality: Right;   Hip replacement      Rt hip in 2010 in [place]   KNEE ARTHROSCOPY Left 03/29/2015   Procedure: ARTHROSCOPY LEFT KNEE WITH MENSICAL DEBRIDEMENT, chondroplasty;  Surgeon: Dempsey Moan, MD;  Location: [facility];  Service:  Orthopedics;  Laterality: Left;   ORIF TIBIA PLATEAU Right 02/29/2016   Procedure: OPEN REDUCTION INTERNAL FIXATION (ORIF RIGHT  TIBIAL PLATEAU FRACTURE;  Surgeon: Dempsey Moan, MD;  Location: [facility];  Service: Orthopedics;  Laterality: Right;   POLYPECTOMY  04/01/2023   Procedure: POLYPECTOMY;  Surgeon: Eartha Angelia Sieving, MD;  [facility];  Service: Gastroenterology;;   TEE WITHOUT CARDIOVERSION N/A 03/27/2021   Procedure: TRANSESOPHAGEAL ECHOCARDIOGRAM (TEE);  Surgeon: Alvan Dorn FALCON, MD;  [facility];  Service: Endoscopy;  Laterality: N/A;   TOTAL KNEE ARTHROPLASTY Right 08/25/2017   Procedure: TOTAL RIGHT KNEE ARTHROPLASTY;  Surgeon: Moan Dempsey, MD;  Location: [facility];  Service: Orthopedics;  Laterality: Right;    Family History  Problem Relation Age of Onset   Crohn's disease Brother    Depression Mother    Anxiety disorder Mother    Depression Sister    Aneurysm Father    Colon cancer Neg Hx     Allergies as of 02/26/2024 - Review Complete 02/26/2024  Allergen Reaction Noted   Entyvio  [vedolizumab ] Shortness Of Breath and Nausea And Vomiting 01/13/2024   Robaxin  [methocarbamol ] Itching 07/29/2016   Sulfa antibiotics Itching 07/29/2016    Social History   Socioeconomic History   Marital status: Married    Spouse name: Not on file   Number of children: Not on file   Years of education: Not on file   Highest education level: Not on file  Occupational History   Not on file  Tobacco Use   Smoking status: Never   Smokeless tobacco: Never  Vaping Use   Vaping status: Never Used  Substance and Sexual Activity   Alcohol  use: No    Alcohol /week: 0.0 standard drinks of alcohol    Drug use: No   Sexual activity: Not Currently  Other Topics Concern   Not on file  Social History Narrative   Not on file   Social Drivers of Health   Financial Resource Strain: Not on file  Food Insecurity: No Food Insecurity (10/19/2023)   Hunger Vital Sign    Worried About Running Out of Food in the Last Year: Never true    Ran Out of Food in the Last Year: Never true  Transportation Needs: No Transportation Needs (10/19/2023)   PRAPARE - Administrator, Civil Service (Medical): No    Lack of Transportation (Non-Medical): No  Physical Activity: Not on file  Stress: Not on file  Social Connections: Socially Integrated (07/14/2023)   Social Connection and Isolation Panel    Frequency of Communication with Friends and Family: More than  three times a week    Frequency of Social Gatherings with Friends and Family: More than three times a week    Attends Religious Services: More than 4 times per year    Active Member of [employer] or Organizations: Not on file    Attends Engineer, structural: More than 4 times per year    Marital Status: Married     Review of Systems   Gen: Denies fever, chills, anorexia. Denies fatigue, weakness, weight loss.  CV: Denies chest pain, palpitations, syncope, peripheral edema, and claudication. Resp: Denies dyspnea at rest, cough, wheezing, coughing up blood, and pleurisy. GI: See HPI Derm: Denies rash, itching, dry skin Psych: Denies depression, anxiety, memory loss, confusion. No homicidal or suicidal ideation.  Heme: Denies bruising, bleeding, and enlarged lymph nodes.  Physical Exam   BP 122/63 (BP Location: Right Arm, Patient Position: Sitting,  Cuff Size: Normal)   Pulse 84   Temp 98 F (36.7 C) (Temporal)   Ht 5' 2 (1.575 m)   Wt 138 lb (62.6 kg)   BMI 25.24 kg/m   General:   Alert and oriented. No distress noted. Pleasant and cooperative.  Head:  Normocephalic and atraumatic. Eyes:  Conjuctiva clear without scleral icterus. Mouth:  Oral mucosa pink and moist. Good dentition. No lesions. Abdomen:  +BS, soft, non-tender and non-distended. No rebound or guarding. No HSM or masses noted. Rectal: deferred Msk:  Normal posture. Unsteady gait.  Extremities:  Without edema. Scattered bruising.  Neurologic:  Alert and  oriented x4 Psych:  Alert and cooperative. Normal mood and affect.  Assessment  Jessica [name] is a 73 y.o. female presenting today to discuss her recent liver biopsy results and symptoms related to Chron's.  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 She currently shows no signs of decompensation. MELD score is low at 10-12 (3.0 vs Na score). Positive smooth muscle and  mitochondrial antibodies suggest possible autoimmune hepatitis or PBC overlap. She is concerned about her recent liver biopsy results, which confirmed the presence of cirrhosis. Her MELD score is between 10 and 12. Biopsy confirmed fatty liver with signs of some regression. Discussed risks of liver cancer and need for regular US  surveillance. Discussed potential complications in detail and no cure other than transplantation. Discussed usually evaluation for this is initiated once MELD gets to 15.  - Provide written handout on cirrhosis and dietary recommendations. - Order liver ultrasound and MELD labs in October. - Refer to hepatology for further evaluation and determining need for treatment of possible autoimmune/PBC overlap ([facility] vs [facility] pending availability) - Monitor labs and ultrasound every six months. - Discussed Mediterranean diet for liver health.  Crohn's disease with associated diarrhea and abdominal cramping Continued diarrhea and abdominal cramping daily. Skyrizi  induction ongoing with expected improvement in 3-12 months. Diarrhea frequency is 3-4 times daily, sometimes urgent. Dicyclomine  and loperamide  used for symptom control. Hopeful with ongoing use of Skyrizi  that she will see improvement in these symptoms. Nausea could be secondary to medication but unclear at this time.  - Continue Skyrizi  therapy and monitor response. - Schedule surveillance colonoscopy 6-12 months after Skyrizi  maintenance. - Refilled loperamide  prescription. - Refilled dicyclomine  prescription.  GERD, Nausea and vomiting Nausea and vomiting possibly related to medication intake on an empty stomach. Symptoms include headaches and vomiting, not consistently linked to Skyrizi  timing. Advised to take medications with food to mitigate symptoms. Could be secondary to chronic kidney disease as well as hypothyroidism.  - Advised taking medications with food to reduce nausea. - Continue daily Pantoprazole  40 mg -  Use Zofran  at onset of nausea to prevent vomiting. - Consider ginger ale or ginger chews for nausea relief.  Headache Headaches reported, possibly related to Skyrizi , occurring intermittently and not directly after infusion. Unclear if related to stress, dehydration, or HTN.  - Monitor headache frequency and severity.      Stable IDA at this time.   PLAN    Follow up in mid to late October.   47 minutes spent face to face with patient with > 50% of time spent on education and discussing plan of care. An additional 15 minutes spent on pre and post activities.   Charmaine Melia, MSN, FNP-BC, AGACNP-BC [facility]  I have reviewed the note and agree with the APP's assessment as described in this  progress note  Toribio Fortune, MD Gastroenterology and Hepatology [facility]

## 2024-02-26 NOTE — Patient Instructions (Addendum)
 VISIT SUMMARY:  Today, we discussed your recent liver biopsy results, which confirmed cirrhosis. We also addressed your ongoing symptoms related to Crohn's disease, including diarrhea, abdominal cramping, nausea, and vomiting.We reviewed dietary and lifestyle changes to help manage your conditions and provided recommendations for further evaluation and monitoring. YOUR PLAN: -CIRRHOSIS OF LIVER WITH SUSPECTED AUTOIMMUNE OR METABOLIC ETIOLOGY: Cirrhosis is a condition where the liver is scarred and permanently damaged. Your biopsy confirmed cirrhosis, and your MELD score is between 10 and 12, indicating a low risk of liver failure. We discussed the possibility of autoimmune hepatitis or primary biliary cholangitis and the need for regular monitoring. You will receive a handout on cirrhosis and dietary recommendations, and we will order a liver ultrasound and labs in October. You are also being referred to a transplant hepatologist for further evaluation. Regular monitoring with labs and ultrasounds every six months is necessary. We recommend following a Mediterranean diet to support liver health. I attached diet information for you today as well as information about cirrhosis.  I have attached some websites for you to go to for more information about cirrhosis and dietary recommendations.  I have sent referral to transplant hepatology in [place].  If we are unable to get you in within timely manner to [facility]ology then we can also consider referral to [facility] to see Dr. Myrna who comes to [facility] once per month.  Cirrhosis Lifestyle Recommendations:  High-protein diet from a primarily plant-based diet. Avoid red meat.  No raw or undercooked meat, seafood, or shellfish. Low-fat/cholesterol/carbohydrate diet. Limit sodium to no more than 2000 mg/day including everything that you eat and drink. Recommend at least 30 minutes of aerobic and resistance exercise 3 days/week. Limit  Tylenol  to 2000 mg daily.   BackupSupply.hu  https://www.cirrhosiscare.ca/  https://patient.WedMap.com.cy  -CROHN'S DISEASE WITH ASSOCIATED DIARRHEA AND ABDOMINAL CRAMPING: Crohn's disease is a chronic inflammatory condition of the digestive tract. You are experiencing diarrhea and abdominal cramping, and you are currently on Skyrizi  therapy, which may take 3-12 months to show improvement. We will continue your Skyrizi  therapy and monitor your response. A surveillance colonoscopy is scheduled 6-12 months after starting Skyrizi  maintenance. Your prescriptions for loperamide  and dicyclomine  will be refilled to help manage your symptoms.  -NAUSEA AND VOMITING, LIKELY MEDICATION-RELATED: Nausea and vomiting can occur when medications are taken on an empty stomach. To reduce these symptoms, you should take your medications with food. You can continue using Zofran  at the onset of nausea to prevent vomiting. Additionally, ginger ale or ginger chews may help relieve nausea. I sent zofran  refill to the pharmacy for you.   -HEADACHE, POSSIBLY RELATED TO SKYRIZI : You have been experiencing headaches, which may be related to your Skyrizi  medication but could be BP related, dehydration, or overall poor p.o. intake.  We will monitor the frequency and severity of your headaches to determine if any changes to your treatment are needed.  INSTRUCTIONS: Please follow up with the liver ultrasound and labs in October. Continue with your current medications and dietary recommendations. Schedule a surveillance colonoscopy 6-12 months after starting Skyrizi  maintenance. Monitor your headache frequency and severity, and take your medications with food to reduce nausea and vomiting. If you have any new or worsening symptoms, please contact our office.

## 2024-02-28 ENCOUNTER — Other Ambulatory Visit (INDEPENDENT_AMBULATORY_CARE_PROVIDER_SITE_OTHER): Payer: Self-pay | Admitting: Gastroenterology

## 2024-02-28 DIAGNOSIS — K50019 Crohn's disease of small intestine with unspecified complications: Secondary | ICD-10-CM

## 2024-03-01 ENCOUNTER — Other Ambulatory Visit: Payer: Self-pay | Admitting: *Deleted

## 2024-03-01 DIAGNOSIS — K7460 Unspecified cirrhosis of liver: Secondary | ICD-10-CM

## 2024-03-01 MED ORDER — FERROUS SULFATE 325 (65 FE) MG PO TBEC: 325.0000 mg | DELAYED_RELEASE_TABLET | Freq: Every day | ORAL | 3 refills | Status: AC

## 2024-03-01 NOTE — Telephone Encounter (Signed)
 I called and left a Vm asked that the patient please check her my chart as [facility] has sent us  a refill request on Mesalamine  and wanted to know if she was still in need of this or if she had requested this refill.

## 2024-03-03 ENCOUNTER — Telehealth: Payer: Self-pay | Admitting: Gastroenterology

## 2024-03-03 DIAGNOSIS — M47812 Spondylosis without myelopathy or radiculopathy, cervical region: Secondary | ICD-10-CM | POA: Diagnosis not present

## 2024-03-03 DIAGNOSIS — M47816 Spondylosis without myelopathy or radiculopathy, lumbar region: Secondary | ICD-10-CM | POA: Diagnosis not present

## 2024-03-03 NOTE — Telephone Encounter (Signed)
 Please resend referral to [facility] for cirrhosis and possible autoimmune treatment/consult. They have reached out for 14 days and have not heard anything. Please give patient the phone number to their office as well to call for appointment as it may be going to spam.   Charmaine Melia, MSN, APRN, FNP-BC, AGACNP-BC [facility] at [facility]

## 2024-03-03 NOTE — Telephone Encounter (Signed)
 Noted. Thanks.

## 2024-03-03 NOTE — Telephone Encounter (Signed)
 Call the patient today, she did not answer.  I left a voice message stating that the medication will not be refilled as patient notes she has not been using this medication and is only on Skyrizi .  If she is still taking this, she will need to call us  and notify us  for further refills.

## 2024-03-04 NOTE — Telephone Encounter (Signed)
 Referral resent and pt was given number to call to schedule appointment. She states she hasn't been feeling well and that's why she hadn't answered her phone.

## 2024-03-05 ENCOUNTER — Other Ambulatory Visit: Payer: Self-pay | Admitting: Gastroenterology

## 2024-03-05 DIAGNOSIS — K50019 Crohn's disease of small intestine with unspecified complications: Secondary | ICD-10-CM

## 2024-03-05 MED ORDER — MESALAMINE ER 0.375 G PO CP24
1500.0000 mg | ORAL_CAPSULE | Freq: Every day | ORAL | 2 refills | Status: DC
Start: 2024-03-05 — End: 2024-05-11

## 2024-03-09 ENCOUNTER — Encounter: Attending: Gastroenterology | Admitting: Internal Medicine

## 2024-03-09 VITALS — BP 119/62 | HR 71 | Temp 98.0°F | Resp 14

## 2024-03-09 DIAGNOSIS — K5080 Crohn's disease of both small and large intestine without complications: Secondary | ICD-10-CM | POA: Diagnosis not present

## 2024-03-09 MED ORDER — SODIUM CHLORIDE 0.9 % IV SOLN
600.0000 mg | Freq: Once | INTRAVENOUS | Status: AC
  Administered 2024-03-09: 600 mg via INTRAVENOUS
  Filled 2024-03-09: qty 10

## 2024-03-09 MED ORDER — DIPHENHYDRAMINE HCL 25 MG PO CAPS
25.0000 mg | ORAL_CAPSULE | Freq: Once | ORAL | Status: AC
  Administered 2024-03-09: 25 mg via ORAL

## 2024-03-09 MED ORDER — METHYLPREDNISOLONE SODIUM SUCC 125 MG IJ SOLR
125.0000 mg | Freq: Once | INTRAMUSCULAR | Status: AC
  Administered 2024-03-09: 125 mg via INTRAVENOUS

## 2024-03-09 NOTE — Progress Notes (Signed)
 Diagnosis: Crohn's Disease  Provider:  Kennedy Pfeiffer NP  Procedure: IV Infusion  IV Type: Peripheral, IV Location: L Hand  Skyrizi  (risankizumab -rzaa), Dose: 600 mg  Infusion Start Time: 1355  Infusion Stop Time: 1511  Post Infusion IV Care: Observation period completed  Discharge: Condition: Good, Destination: Home . AVS Provided  Performed by:  Blanca Selinda SAUNDERS, LPN

## 2024-03-11 ENCOUNTER — Encounter (HOSPITAL_COMMUNITY): Payer: Self-pay

## 2024-03-11 ENCOUNTER — Other Ambulatory Visit (HOSPITAL_COMMUNITY): Payer: Self-pay

## 2024-03-11 ENCOUNTER — Other Ambulatory Visit: Payer: Self-pay | Admitting: Pharmacy Technician

## 2024-03-11 ENCOUNTER — Other Ambulatory Visit: Payer: Self-pay

## 2024-03-12 ENCOUNTER — Inpatient Hospital Stay

## 2024-03-12 ENCOUNTER — Inpatient Hospital Stay: Attending: Hematology

## 2024-03-12 DIAGNOSIS — D509 Iron deficiency anemia, unspecified: Secondary | ICD-10-CM | POA: Insufficient documentation

## 2024-03-12 DIAGNOSIS — N1832 Chronic kidney disease, stage 3b: Secondary | ICD-10-CM

## 2024-03-12 DIAGNOSIS — M47812 Spondylosis without myelopathy or radiculopathy, cervical region: Secondary | ICD-10-CM | POA: Diagnosis not present

## 2024-03-12 DIAGNOSIS — M47816 Spondylosis without myelopathy or radiculopathy, lumbar region: Secondary | ICD-10-CM | POA: Diagnosis not present

## 2024-03-12 LAB — CBC WITH DIFFERENTIAL/PLATELET
Abs Immature Granulocytes: 0.06 K/uL (ref 0.00–0.07)
Basophils Absolute: 0 K/uL (ref 0.0–0.1)
Basophils Relative: 0 %
Eosinophils Absolute: 0.2 K/uL (ref 0.0–0.5)
Eosinophils Relative: 3 %
HCT: 31.2 % — ABNORMAL LOW (ref 36.0–46.0)
Hemoglobin: 11.1 g/dL — ABNORMAL LOW (ref 12.0–15.0)
Immature Granulocytes: 1 %
Lymphocytes Relative: 45 %
Lymphs Abs: 2.2 K/uL (ref 0.7–4.0)
MCH: 33 pg (ref 26.0–34.0)
MCHC: 35.6 g/dL (ref 30.0–36.0)
MCV: 92.9 fL (ref 80.0–100.0)
Monocytes Absolute: 0.4 K/uL (ref 0.1–1.0)
Monocytes Relative: 8 %
Neutro Abs: 2.1 K/uL (ref 1.7–7.7)
Neutrophils Relative %: 43 %
Platelets: 132 K/uL — ABNORMAL LOW (ref 150–400)
RBC: 3.36 MIL/uL — ABNORMAL LOW (ref 3.87–5.11)
RDW: 12.9 % (ref 11.5–15.5)
WBC: 5 K/uL (ref 4.0–10.5)
nRBC: 0 % (ref 0.0–0.2)

## 2024-03-12 NOTE — Progress Notes (Signed)
 Patient presented today for Retacrit  injection. Patient's Hgb 11.1. Patient's parameter for injection is less than 11, no injection required today.

## 2024-03-16 ENCOUNTER — Ambulatory Visit: Admitting: Cardiology

## 2024-03-16 ENCOUNTER — Telehealth: Payer: Self-pay | Admitting: Gastroenterology

## 2024-03-16 DIAGNOSIS — M47816 Spondylosis without myelopathy or radiculopathy, lumbar region: Secondary | ICD-10-CM | POA: Diagnosis not present

## 2024-03-16 DIAGNOSIS — M47812 Spondylosis without myelopathy or radiculopathy, cervical region: Secondary | ICD-10-CM | POA: Diagnosis not present

## 2024-03-16 DIAGNOSIS — Z23 Encounter for immunization: Secondary | ICD-10-CM | POA: Diagnosis not present

## 2024-03-16 NOTE — Telephone Encounter (Signed)
 Please send referral to [facility] hepatology, Dr. Myrna in [place] to see if they have any availability prior to December.  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  Positive ASMA and AMA  Charmaine Melia, MSN, APRN, FNP-BC, AGACNP-BC [facility]astroenterology at [place]

## 2024-03-17 ENCOUNTER — Other Ambulatory Visit: Payer: Self-pay | Admitting: *Deleted

## 2024-03-17 DIAGNOSIS — N183 Chronic kidney disease, stage 3 unspecified: Secondary | ICD-10-CM | POA: Diagnosis not present

## 2024-03-17 DIAGNOSIS — N1832 Chronic kidney disease, stage 3b: Secondary | ICD-10-CM | POA: Diagnosis not present

## 2024-03-17 DIAGNOSIS — N1831 Chronic kidney disease, stage 3a: Secondary | ICD-10-CM | POA: Diagnosis not present

## 2024-03-17 DIAGNOSIS — E211 Secondary hyperparathyroidism, not elsewhere classified: Secondary | ICD-10-CM | POA: Diagnosis not present

## 2024-03-17 DIAGNOSIS — K7460 Unspecified cirrhosis of liver: Secondary | ICD-10-CM

## 2024-03-17 DIAGNOSIS — E119 Type 2 diabetes mellitus without complications: Secondary | ICD-10-CM | POA: Diagnosis not present

## 2024-03-17 DIAGNOSIS — D631 Anemia in chronic kidney disease: Secondary | ICD-10-CM | POA: Diagnosis not present

## 2024-03-17 DIAGNOSIS — R809 Proteinuria, unspecified: Secondary | ICD-10-CM | POA: Diagnosis not present

## 2024-03-17 DIAGNOSIS — N189 Chronic kidney disease, unspecified: Secondary | ICD-10-CM | POA: Diagnosis not present

## 2024-03-17 DIAGNOSIS — I10 Essential (primary) hypertension: Secondary | ICD-10-CM | POA: Diagnosis not present

## 2024-03-17 NOTE — Telephone Encounter (Signed)
 LMOVM for Jessica [name], [facility] to return call regarding referral

## 2024-03-17 NOTE — Telephone Encounter (Signed)
 Referral faxed

## 2024-03-19 ENCOUNTER — Other Ambulatory Visit: Payer: Self-pay

## 2024-03-19 DIAGNOSIS — M47812 Spondylosis without myelopathy or radiculopathy, cervical region: Secondary | ICD-10-CM | POA: Diagnosis not present

## 2024-03-19 DIAGNOSIS — M47816 Spondylosis without myelopathy or radiculopathy, lumbar region: Secondary | ICD-10-CM | POA: Diagnosis not present

## 2024-03-19 NOTE — Progress Notes (Signed)
 Insurance will pay on 9/26

## 2024-03-25 DIAGNOSIS — K7460 Unspecified cirrhosis of liver: Secondary | ICD-10-CM | POA: Diagnosis not present

## 2024-03-25 DIAGNOSIS — K7402 Hepatic fibrosis, advanced fibrosis: Secondary | ICD-10-CM | POA: Diagnosis not present

## 2024-03-25 DIAGNOSIS — K7589 Other specified inflammatory liver diseases: Secondary | ICD-10-CM | POA: Diagnosis not present

## 2024-03-26 ENCOUNTER — Other Ambulatory Visit: Payer: Self-pay

## 2024-04-02 ENCOUNTER — Other Ambulatory Visit: Payer: Self-pay

## 2024-04-02 NOTE — Progress Notes (Signed)
 Patient will be using [facility]. Dis-enrolling

## 2024-04-02 NOTE — Progress Notes (Signed)
 Pharmacy Patient Advocate Encounter  Insurance verification completed.   The patient is insured through [employer]   Ran test claim for Skyrizi . Co-pay is $0.  This test claim was processed through [facility]- copay amounts may vary at other pharmacies due to pharmacy/plan contracts, or as the patient moves through the different stages of their insurance plan.

## 2024-04-06 ENCOUNTER — Telehealth: Payer: Self-pay

## 2024-04-06 DIAGNOSIS — M47816 Spondylosis without myelopathy or radiculopathy, lumbar region: Secondary | ICD-10-CM | POA: Diagnosis not present

## 2024-04-06 DIAGNOSIS — Z79891 Long term (current) use of opiate analgesic: Secondary | ICD-10-CM | POA: Diagnosis not present

## 2024-04-06 DIAGNOSIS — Z79899 Other long term (current) drug therapy: Secondary | ICD-10-CM | POA: Diagnosis not present

## 2024-04-06 DIAGNOSIS — G894 Chronic pain syndrome: Secondary | ICD-10-CM | POA: Diagnosis not present

## 2024-04-06 NOTE — Telephone Encounter (Signed)
 Pt's husband called stating that the pt took her first at home dose of Skyrizi  and it started leaking at the end of the injection. Husband stated that they contacted the [facility] nurse and was instructed to call the office to see what they should do. While husband was on the phone I consulted with Charmaine Melia, NP and she stated that nothing was needing to be done to proceed with next dose at reg schedule. Pt's husband was made aware and verbalized understanding.

## 2024-04-08 ENCOUNTER — Encounter (HOSPITAL_COMMUNITY): Payer: Self-pay

## 2024-04-08 ENCOUNTER — Emergency Department (HOSPITAL_COMMUNITY)

## 2024-04-08 ENCOUNTER — Inpatient Hospital Stay (HOSPITAL_COMMUNITY)
Admission: EM | Admit: 2024-04-08 | Discharge: 2024-04-11 | DRG: 071 | Disposition: A | Attending: Internal Medicine | Admitting: Internal Medicine

## 2024-04-08 ENCOUNTER — Telehealth (HOSPITAL_COMMUNITY): Payer: Self-pay | Admitting: *Deleted

## 2024-04-08 ENCOUNTER — Other Ambulatory Visit: Payer: Self-pay

## 2024-04-08 DIAGNOSIS — E039 Hypothyroidism, unspecified: Secondary | ICD-10-CM | POA: Diagnosis present

## 2024-04-08 DIAGNOSIS — I129 Hypertensive chronic kidney disease with stage 1 through stage 4 chronic kidney disease, or unspecified chronic kidney disease: Secondary | ICD-10-CM | POA: Diagnosis present

## 2024-04-08 DIAGNOSIS — Z7901 Long term (current) use of anticoagulants: Secondary | ICD-10-CM | POA: Diagnosis not present

## 2024-04-08 DIAGNOSIS — F22 Delusional disorders: Secondary | ICD-10-CM | POA: Diagnosis present

## 2024-04-08 DIAGNOSIS — M19021 Primary osteoarthritis, right elbow: Secondary | ICD-10-CM | POA: Diagnosis not present

## 2024-04-08 DIAGNOSIS — Z888 Allergy status to other drugs, medicaments and biological substances status: Secondary | ICD-10-CM

## 2024-04-08 DIAGNOSIS — Z1152 Encounter for screening for COVID-19: Secondary | ICD-10-CM

## 2024-04-08 DIAGNOSIS — Z9049 Acquired absence of other specified parts of digestive tract: Secondary | ICD-10-CM | POA: Diagnosis not present

## 2024-04-08 DIAGNOSIS — Z882 Allergy status to sulfonamides status: Secondary | ICD-10-CM

## 2024-04-08 DIAGNOSIS — Z818 Family history of other mental and behavioral disorders: Secondary | ICD-10-CM

## 2024-04-08 DIAGNOSIS — Z635 Disruption of family by separation and divorce: Secondary | ICD-10-CM

## 2024-04-08 DIAGNOSIS — Z79899 Other long term (current) drug therapy: Secondary | ICD-10-CM | POA: Diagnosis not present

## 2024-04-08 DIAGNOSIS — F419 Anxiety disorder, unspecified: Principal | ICD-10-CM

## 2024-04-08 DIAGNOSIS — G8929 Other chronic pain: Secondary | ICD-10-CM | POA: Diagnosis present

## 2024-04-08 DIAGNOSIS — M25521 Pain in right elbow: Secondary | ICD-10-CM | POA: Diagnosis not present

## 2024-04-08 DIAGNOSIS — K5010 Crohn's disease of large intestine without complications: Secondary | ICD-10-CM | POA: Diagnosis present

## 2024-04-08 DIAGNOSIS — Z8659 Personal history of other mental and behavioral disorders: Secondary | ICD-10-CM | POA: Diagnosis not present

## 2024-04-08 DIAGNOSIS — F32A Depression, unspecified: Secondary | ICD-10-CM

## 2024-04-08 DIAGNOSIS — Z96641 Presence of right artificial hip joint: Secondary | ICD-10-CM | POA: Diagnosis present

## 2024-04-08 DIAGNOSIS — D333 Benign neoplasm of cranial nerves: Secondary | ICD-10-CM | POA: Diagnosis not present

## 2024-04-08 DIAGNOSIS — N3000 Acute cystitis without hematuria: Secondary | ICD-10-CM | POA: Diagnosis not present

## 2024-04-08 DIAGNOSIS — B9620 Unspecified Escherichia coli [E. coli] as the cause of diseases classified elsewhere: Secondary | ICD-10-CM | POA: Diagnosis not present

## 2024-04-08 DIAGNOSIS — Z0289 Encounter for other administrative examinations: Secondary | ICD-10-CM | POA: Diagnosis not present

## 2024-04-08 DIAGNOSIS — D509 Iron deficiency anemia, unspecified: Secondary | ICD-10-CM | POA: Diagnosis present

## 2024-04-08 DIAGNOSIS — F329 Major depressive disorder, single episode, unspecified: Secondary | ICD-10-CM | POA: Diagnosis not present

## 2024-04-08 DIAGNOSIS — D849 Immunodeficiency, unspecified: Secondary | ICD-10-CM | POA: Diagnosis not present

## 2024-04-08 DIAGNOSIS — F4325 Adjustment disorder with mixed disturbance of emotions and conduct: Secondary | ICD-10-CM | POA: Diagnosis not present

## 2024-04-08 DIAGNOSIS — G9341 Metabolic encephalopathy: Principal | ICD-10-CM | POA: Diagnosis present

## 2024-04-08 DIAGNOSIS — Z90710 Acquired absence of both cervix and uterus: Secondary | ICD-10-CM

## 2024-04-08 DIAGNOSIS — M1990 Unspecified osteoarthritis, unspecified site: Secondary | ICD-10-CM | POA: Diagnosis present

## 2024-04-08 DIAGNOSIS — Z87440 Personal history of urinary (tract) infections: Secondary | ICD-10-CM

## 2024-04-08 DIAGNOSIS — M549 Dorsalgia, unspecified: Secondary | ICD-10-CM | POA: Diagnosis present

## 2024-04-08 DIAGNOSIS — G4730 Sleep apnea, unspecified: Secondary | ICD-10-CM | POA: Diagnosis present

## 2024-04-08 DIAGNOSIS — K219 Gastro-esophageal reflux disease without esophagitis: Secondary | ICD-10-CM | POA: Diagnosis present

## 2024-04-08 DIAGNOSIS — R410 Disorientation, unspecified: Secondary | ICD-10-CM | POA: Diagnosis not present

## 2024-04-08 DIAGNOSIS — R45851 Suicidal ideations: Secondary | ICD-10-CM | POA: Diagnosis not present

## 2024-04-08 DIAGNOSIS — Z86711 Personal history of pulmonary embolism: Secondary | ICD-10-CM

## 2024-04-08 DIAGNOSIS — K7460 Unspecified cirrhosis of liver: Secondary | ICD-10-CM | POA: Diagnosis present

## 2024-04-08 DIAGNOSIS — G9340 Encephalopathy, unspecified: Secondary | ICD-10-CM | POA: Diagnosis present

## 2024-04-08 DIAGNOSIS — Z79890 Hormone replacement therapy (postmenopausal): Secondary | ICD-10-CM

## 2024-04-08 DIAGNOSIS — I679 Cerebrovascular disease, unspecified: Secondary | ICD-10-CM | POA: Diagnosis present

## 2024-04-08 DIAGNOSIS — N1832 Chronic kidney disease, stage 3b: Secondary | ICD-10-CM | POA: Diagnosis not present

## 2024-04-08 DIAGNOSIS — Z96651 Presence of right artificial knee joint: Secondary | ICD-10-CM | POA: Diagnosis not present

## 2024-04-08 DIAGNOSIS — F411 Generalized anxiety disorder: Secondary | ICD-10-CM | POA: Diagnosis not present

## 2024-04-08 DIAGNOSIS — H903 Sensorineural hearing loss, bilateral: Secondary | ICD-10-CM | POA: Diagnosis present

## 2024-04-08 LAB — ETHANOL: Alcohol, Ethyl (B): <15 mg/dL (ref <15)

## 2024-04-08 LAB — COMPREHENSIVE METABOLIC PANEL WITH GFR
ALT: 16 U/L (ref 0–44)
AST: 29 U/L (ref 15–41)
Albumin: 3.5 g/dL (ref 3.5–5.0)
Alkaline Phosphatase: 79 U/L (ref 38–126)
Anion gap: 11 (ref 5–15)
BUN: 11 mg/dL (ref 8–23)
CO2: 22 mmol/L (ref 22–32)
Calcium: 9.4 mg/dL (ref 8.9–10.3)
Chloride: 108 mmol/L (ref 98–111)
Creatinine, Ser: 1.33 mg/dL — ABNORMAL HIGH (ref 0.44–1.00)
GFR, Estimated: 43 mL/min — ABNORMAL LOW (ref >60)
Glucose, Bld: 78 mg/dL (ref 70–99)
Potassium: 3 mmol/L — ABNORMAL LOW (ref 3.5–5.1)
Sodium: 141 mmol/L (ref 135–145)
Total Bilirubin: 1.2 mg/dL (ref 0.0–1.2)
Total Protein: 6.2 g/dL — ABNORMAL LOW (ref 6.5–8.1)

## 2024-04-08 LAB — CBC WITH DIFFERENTIAL/PLATELET
Abs Immature Granulocytes: 0.02 K/uL (ref 0.00–0.07)
Basophils Absolute: 0 K/uL (ref 0.0–0.1)
Basophils Relative: 0 %
Eosinophils Absolute: 0.2 K/uL (ref 0.0–0.5)
Eosinophils Relative: 4 %
HCT: 29.8 % — ABNORMAL LOW (ref 36.0–46.0)
Hemoglobin: 10.3 g/dL — ABNORMAL LOW (ref 12.0–15.0)
Immature Granulocytes: 1 %
Lymphocytes Relative: 40 %
Lymphs Abs: 1.5 K/uL (ref 0.7–4.0)
MCH: 32.5 pg (ref 26.0–34.0)
MCHC: 34.6 g/dL (ref 30.0–36.0)
MCV: 94 fL (ref 80.0–100.0)
Monocytes Absolute: 0.3 K/uL (ref 0.1–1.0)
Monocytes Relative: 8 %
Neutro Abs: 1.8 K/uL (ref 1.7–7.7)
Neutrophils Relative %: 47 %
Platelets: 154 K/uL (ref 150–400)
RBC: 3.17 MIL/uL — ABNORMAL LOW (ref 3.87–5.11)
RDW: 12.5 % (ref 11.5–15.5)
WBC: 3.7 K/uL — ABNORMAL LOW (ref 4.0–10.5)
nRBC: 0 % (ref 0.0–0.2)

## 2024-04-08 LAB — SARS CORONAVIRUS 2 BY RT PCR: SARS Coronavirus 2 by RT PCR: NEGATIVE

## 2024-04-08 MED ORDER — LORAZEPAM 1 MG PO TABS: 1.0000 mg | ORAL_TABLET | Freq: Once | ORAL | Status: DC

## 2024-04-08 MED ORDER — LEVOTHYROXINE SODIUM 50 MCG PO TABS
100.0000 ug | ORAL_TABLET | Freq: Every day | ORAL | Status: DC
  Administered 2024-04-08 – 2024-04-09 (×2): 100 ug via ORAL
  Filled 2024-04-08 (×2): qty 1

## 2024-04-08 MED ORDER — BUPROPION HCL ER (XL) 300 MG PO TB24
300.0000 mg | ORAL_TABLET | ORAL | Status: DC
  Administered 2024-04-09 – 2024-04-11 (×3): 300 mg via ORAL
  Filled 2024-04-08 (×2): qty 1
  Filled 2024-04-08: qty 2

## 2024-04-08 MED ORDER — ESCITALOPRAM OXALATE 10 MG PO TABS
10.0000 mg | ORAL_TABLET | Freq: Every day | ORAL | Status: DC
  Administered 2024-04-08 – 2024-04-11 (×4): 10 mg via ORAL
  Filled 2024-04-08 (×4): qty 1

## 2024-04-08 MED ORDER — OLANZAPINE 10 MG IM SOLR
2.5000 mg | Freq: Four times a day (QID) | INTRAMUSCULAR | Status: DC | PRN
Start: 2024-04-08 — End: 2024-04-11

## 2024-04-08 MED ORDER — APIXABAN 5 MG PO TABS
5.0000 mg | ORAL_TABLET | Freq: Two times a day (BID) | ORAL | Status: DC
  Administered 2024-04-09 – 2024-04-11 (×5): 5 mg via ORAL
  Filled 2024-04-08 (×6): qty 1

## 2024-04-08 MED ORDER — HALOPERIDOL LACTATE 5 MG/ML IJ SOLN
5.0000 mg | Freq: Once | INTRAMUSCULAR | Status: AC
  Administered 2024-04-08: 5 mg via INTRAMUSCULAR
  Filled 2024-04-08: qty 1

## 2024-04-08 MED ORDER — ALPRAZOLAM 0.5 MG PO TABS: 0.5000 mg | ORAL_TABLET | Freq: Two times a day (BID) | ORAL | Status: DC | PRN

## 2024-04-08 MED ORDER — IBUPROFEN 800 MG PO TABS
800.0000 mg | ORAL_TABLET | Freq: Once | ORAL | Status: DC
Start: 2024-04-08 — End: 2024-04-09
  Filled 2024-04-08: qty 1

## 2024-04-08 MED ORDER — OLANZAPINE 2.5 MG PO TABS
2.5000 mg | ORAL_TABLET | Freq: Four times a day (QID) | ORAL | Status: DC | PRN
  Administered 2024-04-09: 2.5 mg via ORAL
  Filled 2024-04-08: qty 1

## 2024-04-08 MED ORDER — POTASSIUM CHLORIDE CRYS ER 20 MEQ PO TBCR
40.0000 meq | EXTENDED_RELEASE_TABLET | Freq: Once | ORAL | Status: AC
  Administered 2024-04-08: 40 meq via ORAL
  Filled 2024-04-08: qty 2

## 2024-04-08 MED ORDER — ACETAMINOPHEN 325 MG PO TABS
650.0000 mg | ORAL_TABLET | Freq: Four times a day (QID) | ORAL | Status: DC | PRN
  Administered 2024-04-08 – 2024-04-10 (×3): 650 mg via ORAL
  Filled 2024-04-08 (×3): qty 2

## 2024-04-08 MED ORDER — BACLOFEN 10 MG PO TABS
10.0000 mg | ORAL_TABLET | Freq: Two times a day (BID) | ORAL | Status: DC | PRN
  Administered 2024-04-08 – 2024-04-09 (×2): 10 mg via ORAL
  Filled 2024-04-08 (×3): qty 1

## 2024-04-08 MED ORDER — CALCITRIOL 0.25 MCG PO CAPS
0.2500 ug | ORAL_CAPSULE | Freq: Every day | ORAL | Status: DC
Start: 2024-04-09 — End: 2024-04-11
  Administered 2024-04-09 – 2024-04-11 (×3): 0.25 ug via ORAL
  Filled 2024-04-08 (×3): qty 1
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
## 2024-04-08 NOTE — ED Triage Notes (Signed)
 Pt came in via POV very distraught about everything she has going on in life & is very emotional, pt's son had her come in for a mental health evaluation d/t being so distraught. Pt denies thought of SI/HI, does endorse taking a xanax  that she has a prescription for this morning but it has not really helped. Pt endorses many recent health issues & her husband playing mind games with me & is about to go through a divorce. Son on the phone with pt is very supportive.

## 2024-04-08 NOTE — ED Provider Notes (Signed)
 Emergency Department Provider Note   I have reviewed the triage vital signs and the nursing notes.   HISTORY  Chief Complaint Anxiety and Mental Health Eval   HPI Jessica [name] is a 73 y.o. female with past history reviewed below presents emergency department for evaluation of increased anxiety, paranoid thinking, feeling hopeless.  She is tearful.  She states that she is going through divorce with her husband who is playing mind games.  She tells me that she is telling her that she will not get any money and that she is crazy.  She feels like she wakes up and sees him pleasuring himself or using a sex device which he denies. She drove herself here to prove that I'm not crazy. No changes to medications. Denies EtOH or drug use. Denies any SI/HI.    Past Medical History:  Diagnosis Date   Anxiety attack    Arthritis    Back pain, chronic    Chronic kidney disease    Crohn's colitis (HCC)    Depression (emotion)    Dysrhythmia    GERD (gastroesophageal reflux disease)    Hypertension    Hypothyroid    Iron deficiency anemia, unspecified    Nausea and vomiting 10/22/2023   Neuropathy    Sleep apnea    cpap is not working per patient - needs to find a new doctor - not used since 7/16 per pat    Stage 3 chronic kidney disease (HCC)    Vertigo    followed by Dr Kievie- in [place]     Review of Systems  Constitutional: No fever/chills Cardiovascular: Denies chest pain. Respiratory: Denies shortness of breath. Gastrointestinal: No abdominal pain.  No nausea, no vomiting.  No diarrhea.  No constipation. Genitourinary: Negative for dysuria. Musculoskeletal: Negative for back pain. Skin: Negative for rash. Neurological: Negative for headaches, focal weakness or numbness. Psychiatric: Increased anxiety.  ____________________________________________   PHYSICAL EXAM:  VITAL SIGNS: ED Triage Vitals  Encounter Vitals Group     BP 04/08/24 1250 125/75     Pulse Rate  04/08/24 1250 95     Resp 04/08/24 1250 18     Temp 04/08/24 1250 98.3 F (36.8 C)     SpO2 04/08/24 1250 100 %     Weight 04/08/24 1302 132 lb (59.9 kg)     Height 04/08/24 1302 5' 2 (1.575 m)   Constitutional: Alert and oriented. Well appearing and in no acute distress. Eyes: Conjunctivae are normal.  Head: Atraumatic. Nose: No congestion/rhinnorhea. Mouth/Throat: Mucous membranes are moist.  Neck: No stridor.   Cardiovascular: Normal rate, regular rhythm. Good peripheral circulation. Grossly normal heart sounds.   Respiratory: Normal respiratory effort.  No retractions. Lungs CTAB. Gastrointestinal: Soft and nontender. No distention.  Musculoskeletal: No gross deformities of extremities. Neurologic:  Normal speech and language.  Skin:  Skin is warm, dry and intact. No rash noted.  ____________________________________________   LABS (all labs ordered are listed, but only abnormal results are displayed)  Labs Reviewed  SARS CORONAVIRUS 2 BY RT PCR  COMPREHENSIVE METABOLIC PANEL WITH GFR  ETHANOL  CBC WITH DIFFERENTIAL/PLATELET   ____________________________________________  EKG   EKG Interpretation Date/Time:  Thursday April 08 2024 14:05:22 EDT Ventricular Rate:  98 PR Interval:  188 QRS Duration:  72 QT Interval:  342 QTC Calculation: 436 R Axis:   142  Text Interpretation: Normal sinus rhythm Left posterior fascicular block Cannot rule out Anterior infarct , age undetermined Abnormal ECG When compared with  ECG of 18-Oct-2023 14:29, PREVIOUS ECG IS PRESENT Confirmed by Darra Chew [phone]) on 04/08/2024 3:08:26 PM        ____________________________________________  RADIOLOGY  DG Chest 1 View Result Date: 04/08/2024 CLINICAL DATA:  Medical clearance for psychiatric admission. EXAM: CHEST  1 VIEW COMPARISON:  10/18/2023 FINDINGS: The heart size and mediastinal contours are within normal limits. Stable calcified granuloma in the lateral left lung. There is  no evidence of pulmonary edema, consolidation, pneumothorax or pleural fluid. The visualized skeletal structures are unremarkable. IMPRESSION: No active disease. Electronically Signed   By: Marcey Moan M.D.   On: 04/08/2024 14:14    ____________________________________________   PROCEDURES  Procedure(s) performed:   Procedures  None  ____________________________________________   INITIAL IMPRESSION / ASSESSMENT AND PLAN / ED COURSE  Pertinent labs & imaging results that were available during my care of the patient were reviewed by me and considered in my medical decision making (see chart for details).   This patient is Presenting for Evaluation of AMS, which does require a range of treatment options, and is a complaint that involves a moderate risk of morbidity and mortality.  The Differential Diagnoses includes but is not exclusive to alcohol , illicit or prescription medications, intracranial pathology such as stroke, intracerebral hemorrhage, fever or infectious causes including sepsis, hypoxemia, uremia, trauma, endocrine related disorders such as diabetes, hypoglycemia, thyroid -related diseases, etc.  Clinical Laboratory Tests Ordered, included ***  Radiologic Tests Ordered, included CXR. I independently interpreted the images and agree with radiology interpretation.   Cardiac Monitor Tracing which shows NSR.    Social Determinants of Health Risk patient is a non-smoker.   Consult complete with  Medical Decision Making: Summary:  Patient presents emergency department with increased agitation.  I do have some suspicion that she may be having some increased paranoid thinking and possible hallucinations. No SI but may benefit from seeing psychiatry evaluation.   Reevaluation with update and discussion with   ***Considered admission***  Patient's presentation is most consistent with acute presentation with potential threat to life or bodily function.   Disposition:    ____________________________________________  FINAL CLINICAL IMPRESSION(S) / ED DIAGNOSES  Final diagnoses:  None     NEW OUTPATIENT MEDICATIONS STARTED DURING THIS VISIT:  New Prescriptions   No medications on file    Note:  This document was prepared using Dragon voice recognition software and may include unintentional dictation errors.  Chew Darra, MD, [facility] Emergency Medicine

## 2024-04-08 NOTE — ED Notes (Signed)
 Assuming pt care, pt laying in bed, restless, in blue paper scrubs, room arranged for safety. Patient wanded, sitter at bedside. PRN agitation meds requested by private msg to pharmacy. Pt reports bilateral leg pain, ice pack provided

## 2024-04-08 NOTE — ED Notes (Signed)
 In patient belongings, found to be unlabeled medications in small ziploc bag. Medications wasted/discarded. Witnessed by Luetta, Charity fundraiser.

## 2024-04-08 NOTE — Telephone Encounter (Signed)
 She needs to address her concerns to her ED providers . We can get her in to an earlier appt if possible

## 2024-04-08 NOTE — ED Notes (Signed)
 Patient extremely anxious and standing up saying she needs my files to be deleted in mychart. Patient screaming and difficult to redirect. EDP notified.

## 2024-04-08 NOTE — ED Notes (Signed)
 Patient has been wanded by security.

## 2024-04-08 NOTE — ED Notes (Signed)
 PT is IVC Envelope Number: [id number] Case Number: [id number] paperwork is in orange zone purple clipboard

## 2024-04-08 NOTE — ED Notes (Incomplete)
 PRN agitation meds requested

## 2024-04-08 NOTE — ED Notes (Signed)
 Phlebotomist at bedside to get labs.

## 2024-04-08 NOTE — ED Notes (Signed)
 Difficulty obtaining labs from patient after two unsuccessful attempts. Second phlebotomist asked to see patient.

## 2024-04-08 NOTE — ED Provider Notes (Addendum)
  Physical Exam  BP (!) 186/86 (BP Location: Right Arm)   Pulse 92   Temp 98 F (36.7 C) (Oral)   Resp 18   Ht 5' 2 (1.575 m)   Wt 59.9 kg   SpO2 100%   BMI 24.14 kg/m   Physical Exam  Procedures  Procedures  ED Course / MDM    Medical Decision Making Care assumed at 3 PM.  Patient is here with anxiety and some hallucinations.  Signed out pending medical clearance labs and TTS consult  7:38 PM Labs unremarkable except potassium of 3.0.  I ordered potassium supplementation.  Patient is medically clear for psych eval  8 pm Patient wanted to leave the ER and was agitated.  IVC paperwork was filled out by me.  Psych consult pending  Amount and/or Complexity of Data Reviewed Labs: ordered.  Risk OTC drugs. Prescription drug management.          Patt Alm Macho, MD 04/08/24 ULYESS    Patt Alm Macho, MD 04/08/24 2302

## 2024-04-08 NOTE — Telephone Encounter (Signed)
 Spoke with patient and informed her with what provider stated and she stated just forget then she will just get someone else to help her. Staff tried to ask patient if she would like to schedule a sooner appt but by that time patient had hung up the phone before staff could say it.

## 2024-04-08 NOTE — ED Notes (Signed)
 Jessica [name] (son) called to ask about Jessica [name]. Tameika keeps [employer]. He called for a update # [phone].

## 2024-04-08 NOTE — Telephone Encounter (Signed)
 Patient called stating she would like for provider to please personally call her. Per pt she is in the hospital and this is an emergency to talk with provider and would like provider to please call her as soon as possible. . Per pt she is not SI nor HI. Per pt, she wants provider to please call her. Patient number is [phone]. Pt did not leave any detailed message as to why she would like provider to call her back.

## 2024-04-08 NOTE — ED Notes (Signed)
 Per request of patient, son/Jessica [name] has been contacted and updated.

## 2024-04-08 NOTE — ED Notes (Signed)
 PATIENT BELONGINGS IN LOCKER #2 IN ED. VALUABLES GIVEN TO SECURITY OFFICE.

## 2024-04-08 NOTE — ED Notes (Signed)
 Patient changed out into paper scrubs and belongings collected.

## 2024-04-08 NOTE — ED Notes (Signed)
 Patient repeatedly trying to take off gown in the hallway. Pt asked to keep clothing on, as she is in the hallway. Pt states well tell people not to look then. Patient asked to remain in stretcher. Stretcher locked in lowest position. Pt has slip resistant socks on.

## 2024-04-08 NOTE — ED Notes (Signed)
 Patient repeatedly asking for pain medication. Patient given tylenol  at 2009 (order is PRN for every 6hours). This has been communicated to patient. Message has been sent to EDP and per EDP, tylenol  has been ordered for pain control.

## 2024-04-08 NOTE — Consult Note (Signed)
 [facility] Consult Note  Patient Name: Jessica [name] MRN: [id number] DOB: 1950/07/21 DATE OF Consult: 04/08/2024  PRIMARY PSYCHIATRIC DIAGNOSES Rule out neurocognitive disorder major with behavioral disturbance; Rule out delirium due to general medical condition; Rule out medication induced psychotic disorder; Unspecified depressive disorder; Rule out major depressive disorder with psychotic features; Rule out psychotic disorder due to another medical condition; Unspecified anxiety disorder  Based on my current evaluation and assessment of the patient, she is a 73 y.o. female who presents with complaints of suicidal threats, paranoid delusions, auditory and visual perceptual disturbances, and impulsive dangerous behaviors that place patient and others at risk. Patient's history greatly contrasts with collateral from family, which is concerning for patient potentially having very little insight/recall about her symptoms or she may be minimizing or intentionally misrepresenting the severity of her mental health symptoms. As such, patient did not meaningfully contract for safety. Collateral from family indicates that patient is at high risk to self and others. The patient's presentation is consistent with Rule out neurocognitive disorder major with behavioral disturbance; Rule out delirium due to general medical condition; Rule out medication induced psychotic disorder; Unspecified depressive disorder; Rule out major depressive disorder with psychotic features; Rule out psychotic disorder due to another medical condition; Unspecified anxiety disorder. Therefore, patient does meet criteria for an intensive inpatient psychiatric hospitalization.  RECOMMENDATIONS  Inpatient psychiatric admission recommended?   YES, patient is at high risk to self at this time. Requires involuntary admission if patient does not agree to voluntary psychiatric admission.   Medication recommendations:  Risks, benefits,  side effects and alternatives to treatments reviewed:  -Continue home psychotropic regimen (recommend performing a medication reconciliation with patient's pharmacy to ascertain accuracy of reported regimen): bupoprion XL 300 mg daily for depression, escitalopram  10 mg daily for anxiety and depression -Would discontinue alprazolam  0.5 mg twice daily as needed for anxiety due to concern that benzodiazepine medications are causing further confusion and mood dysregulation  As needed medications to manage patient's acute symptoms while in hospital care: QTc is 436 ms as of 04/2024 -Maximize utilization of verbal de-escalation techniques, if attempts are unsuccessful and patient poses a threat to self and others: Consider olanzapine (Zyprexa) 2.5 mg to 5 mg PO/IM every 6 hours as needed for severe agitation. Would offer patient the option of taking PO medication first, but if patient refuses then may administer IM medication as a last resort. Would not exceed 10 mg of olanzapine within a 24-hour period. Avoid co-administering intramuscular olanzapine with intravenous benzodiazepine, as giving both medications concurrently is associated with respiratory depression.   Non-Medication recommendations:  -Recommend continued delirium precautions to include frequent reorientation; maintain patient in an appropriately lighted and darkened room to correspond with day and night time (a room with a window is preferred); fall precautions; frequent evaluation of patient's prescribed medications and eliminating unnecessary or redundant medications if clinically appropriate; ascertaining that patient has access to glasses and hearing aids; and limiting anticholinergic medications, opioid analgesics and benzodiazepines if clinically appropriate to minimize iatrogenic contributors to delirium -Please obtain EKG to guide psychotropic management. Note: Please stop all antipsychotic and QTc prolonging medications if patient's QTc  is greater than 480 ms. Of note, to decrease the risk of prolonged QTc, please maintain potassium and magnesium  levels within normal ranges. -Agree with work up for organic causes of altered mentation and mood dysregulation, consider the following if not already performed and clinically appropriate: CT of the head, CBC and differential, basic metabolic profile, liver function  tests (if abnormal consider ammonia level), urinalysis, urine toxicology screen, vitamin B12 level, vitamin D level, TSH with reflex free T4  Observation recommendations:  per unit protocol for monitoring suicidal patient   Follow-Up Telepsychiatry C/L services: We will continue to follow this patient with you until stabilized or discharged.  If you have any questions or concerns, please call our TeleCare Coordination service at  [phone] and ask for myself or the provider on-call.  Communication: Treatment team members (and family members if applicable) who were involved in treatment/care discussions and planning, and with whom we spoke or engaged with via secure text/chat, include the following: patient, son, and primary team    TELEPSYCHIATRY ATTESTATION & CONSENT  As the provider for this telehealth consult, I attest that I verified the patient's identity using two separate identifiers, introduced myself to the patient, provided my credentials, disclosed my location, and performed this encounter via a HIPAA-compliant, real-time, face-to-face, two-way, interactive audio and video platform and with the full consent and agreement of the patient (or guardian as applicable.)  Patient physical location: [facility] at [facility] . Telehealth provider physical location: home office in state of [place].  Video start time: 2140 (Central Time) Video end time: 2200 (Central Time)  IDENTIFYING DATA  Jessica [name] is a 73 y.o. year-old female for whom a psychiatric consultation has been ordered by the  primary provider. The patient was identified using two separate identifiers.  CHIEF COMPLAINT/REASON FOR CONSULT  Behavioral health concerns   HISTORY OF PRESENT ILLNESS (HPI)  I evaluated the patient today face-to-face via secure, HIPAA-compliant telepsychiatric connection, and at the request of the primary treatment team. The reason for the telepsychiatric consultation is that the patient is a 73 year old female with a documented history of crohn disease, benign neoplasm of the cranial nerves, sensorineural hearing loss bilaterally, cerebrovascular disease, tinnitus of the right ear, pulmonary embolism, stage 3b chronic kidney disease, hepatic cirrhosis who presents for psychiatric evaluation given suicidal ideations. Primary team is seeking psychotropic medication recommendations, safety evaluation to determine appropriateness for more intensive psychiatric services and diagnostic clarity as to the patient's presentation.   During one-on-one evaluation with this provider, patient was alert and oriented to self and generally to location and situation. The patient did not appear to be inappropriately internally preoccupied; patient's thought process was concrete. Patient asserted that she presented to the ED given that she felt that she needed to be "checked out" in the context of experiencing overwhelming stressors in her marriage. Patient attempted to check in with her psychiatric provider today but was unable to communicate with provider. She explained that her spouse is very controlling and will not allow her to access any funds without permission. She denies having suicidal or homicidal intent. She is future oriented to engage in psychotherapy and voiced that she wishes to return to the community.   Per collateral from Lonni Louder (son) and his spouse who was also on the line, who was contacted at [phone] at 9:15 pm CST on 04/08/2024 for a call duration of 12 minutes and 22 seconds: This  provider clearly identified self, providing name and role in the care team. Patient has been having visual and auditory perceptual disturbances that appear to be more prominent in the evening. Patient will report that that based on what she perceives, her spouse is either masturbating or using sex toys. However, she will hallucinate during the daytime as well given that she had called asserting that  she is paranoid of her husband as he is monitoring her at all times and that the police were approaching. The severity of patient's paranoia is so extreme that she is becoming more secluded to self and is making impulsive, dangerous decisions. In fact, patient, without warning, drove herself around [place]  when she is not permitted to drive. Moreover, patient has been making suicidal statements. Patient has admitted to son that she has not taken her prescribed medications over the past 2 days. As such, there is also concern that patient is having memory and cognitive concerns that are impairing her ability to maintain her cares in the community. Both son and his spouse are very concerned about the patient's safety and worry that patient may be demonstrating these symptoms due to a medication side effect or her underlying health conditions.   PAST PSYCHIATRIC HISTORY  Inpatient psychiatric treatment: per patient, denies  Outpatient mental health treatment: per patient, denies  Guardianship: per patient, denies Suicide attempts: per patient, denies  Trauma history: patient did not assert further concerns for abuse, trauma, exploitation or neglect beyond described in the HPI  Otherwise as per HPI above.  PAST MEDICAL HISTORY  Past Medical History:  Diagnosis Date   Anxiety attack    Arthritis    Back pain, chronic    Chronic kidney disease    Crohn's colitis (HCC)    Depression (emotion)    Dysrhythmia    GERD (gastroesophageal reflux disease)    Hypertension    Hypothyroid    Iron deficiency  anemia, unspecified    Nausea and vomiting 10/22/2023   Neuropathy    Sleep apnea    cpap is not working per patient - needs to find a new doctor - not used since 7/16 per pat    Stage 3 chronic kidney disease (HCC)    Vertigo    followed by Dr Kievie- in [facility] MEDICATIONS  Facility Ordered Medications  Medication   ibuprofen (ADVIL) tablet 800 mg   [COMPLETED] haloperidol lactate (HALDOL) injection 5 mg   [COMPLETED] potassium chloride  SA (KLOR-CON  M) CR tablet 40 mEq   acetaminophen  (TYLENOL ) tablet 650 mg   ALPRAZolam  (XANAX ) tablet 0.5 mg   apixaban  (ELIQUIS ) tablet 5 mg   baclofen (LIORESAL) tablet 10 mg   [START ON 04/09/2024] buPROPion  (WELLBUTRIN  XL) 24 hr tablet 300 mg   [START ON 04/09/2024] calcitRIOL (ROCALTROL) capsule 0.25 mcg   escitalopram  (LEXAPRO ) tablet 10 mg   levothyroxine  (SYNTHROID ) tablet 100 mcg   PTA Medications  Medication Sig   Multiple Vitamin (MULTIVITAMIN WITH MINERALS) TABS tablet Take 1 tablet by mouth daily.   levothyroxine  (SYNTHROID ) 100 MCG tablet Take 100 mcg by mouth daily.   rOPINIRole  (REQUIP ) 1 MG tablet Take 3 mg by mouth at bedtime.   acetaminophen  (TYLENOL ) 325 MG tablet Take 2 tablets (650 mg total) by mouth every 6 (six) hours as needed for mild pain (pain score 1-3), fever or headache (or Fever >/= 101).   apixaban  (ELIQUIS ) 5 MG TABS tablet Take 1 tablet (5 mg total) by mouth 2 (two) times daily.   Albuterol -Budesonide (AIRSUPRA ) 90-80 MCG/ACT AERO Inhale 2 puffs into the lungs every 8 (eight) hours.   Azelastine HCl 137 MCG/SPRAY SOLN Place 2 sprays into both nostrils 2 (two) times daily as needed (Congestion/ Allergies).   sodium bicarbonate 650 MG tablet Take 650 mg by mouth 2 (two) times daily.   calcitRIOL (ROCALTROL) 0.25 MCG capsule Take 0.25 mcg  by mouth daily.   cyanocobalamin  (VITAMIN B12) 1000 MCG/ML injection Inject 1,000 mcg into the muscle once a week.   thiamine (VITAMIN B-1) 100 MG tablet Take 100 mg by mouth  daily.   baclofen (LIORESAL) 10 MG tablet Take 10 mg by mouth 2 (two) times daily as needed.   cyclobenzaprine (FLEXERIL) 5 MG tablet Take 5 mg by mouth 3 (three) times daily as needed.   methocarbamol  (ROBAXIN ) 500 MG tablet Take 500 mg by mouth 3 (three) times daily as needed.   gabapentin  (NEURONTIN ) 300 MG capsule Take 1 capsule (300 mg total) by mouth 3 (three) times daily.   Risankizumab -rzaa (SKYRIZI ) 180 MG/1.2ML SOCT Inject 180 mg into the skin every 8 (eight) weeks.   Risankizumab -rzaa (SKYRIZI ) 180 MG/1.2ML SOCT Inject 180 mg into the skin every 8 (eight) weeks. Starting at week 12 on the week of 9/30   metoprolol  succinate (TOPROL -XL) 25 MG 24 hr tablet Take 25 mg by mouth daily.   thiamine (VITAMIN B1) 100 MG tablet Take 100 mg by mouth daily.   buPROPion  (WELLBUTRIN  XL) 300 MG 24 hr tablet Take 1 tablet (300 mg total) by mouth every morning.   escitalopram  (LEXAPRO ) 10 MG tablet Take 1 tablet (10 mg total) by mouth daily.   ALPRAZolam  (XANAX ) 0.5 MG tablet Take 1 tablet (0.5 mg total) by mouth 2 (two) times daily as needed.   rosuvastatin  (CRESTOR ) 10 MG tablet Take 10 mg by mouth daily.   loperamide  (IMODIUM ) 2 MG capsule Take 1 capsule (2 mg total) by mouth as needed for diarrhea or loose stools.   pantoprazole  (PROTONIX ) 40 MG tablet Take 1 tablet (40 mg total) by mouth daily.   dicyclomine  (BENTYL ) 10 MG capsule Take 1 capsule (10 mg total) by mouth 2 (two) times daily as needed for spasms.   ondansetron  (ZOFRAN -ODT) 4 MG disintegrating tablet Take 1 tablet (4 mg total) by mouth every 8 (eight) hours as needed.   ferrous sulfate  325 (65 FE) MG EC tablet Take 1 tablet (325 mg total) by mouth daily with breakfast.   mesalamine  (APRISO ) 0.375 g 24 hr capsule Take 4 capsules (1.5 g total) by mouth daily.     ALLERGIES  Allergies  Allergen Reactions   Entyvio  [Vedolizumab ] Shortness Of Breath and Nausea And Vomiting    Joint pain   Robaxin  [Methocarbamol ] Itching   Sulfa  Antibiotics Itching    ALL SULFA DRUGS    SOCIAL & SUBSTANCE USE HISTORY  Social History   Socioeconomic History   Marital status: Married    Spouse name: Not on file   Number of children: Not on file   Years of education: Not on file   Highest education level: Not on file  Occupational History   Not on file  Tobacco Use   Smoking status: Never   Smokeless tobacco: Never  Vaping Use   Vaping status: Never Used  Substance and Sexual Activity   Alcohol  use: No    Alcohol /week: 0.0 standard drinks of alcohol    Drug use: No   Sexual activity: Not Currently  Other Topics Concern   Not on file  Social History Narrative   Not on file   Social Drivers of Health   Financial Resource Strain: Not on file  Food Insecurity: No Food Insecurity (10/19/2023)   Hunger Vital Sign    Worried About Running Out of Food in the Last Year: Never true    Ran Out of Food in the Last Year: Never true  Transportation Needs: No Transportation Needs (10/19/2023)   PRAPARE - Administrator, Civil Service (Medical): No    Lack of Transportation (Non-Medical): No  Physical Activity: Not on file  Stress: Not on file  Social Connections: Socially Integrated (07/14/2023)   Social Connection and Isolation Panel    Frequency of Communication with Friends and Family: More than three times a week    Frequency of Social Gatherings with Friends and Family: More than three times a week    Attends Religious Services: More than 4 times per year    Active Member of [employer] or Organizations: Not on file    Attends Engineer, structural: More than 4 times per year    Marital Status: Married   Social History   Tobacco Use  Smoking Status Never  Smokeless Tobacco Never   Social History   Substance and Sexual Activity  Alcohol  Use No   Alcohol /week: 0.0 standard drinks of alcohol    Social History   Substance and Sexual Activity  Drug Use No    Additional pertinent information none  disclosed .  FAMILY HISTORY  Family History  Problem Relation Age of Onset   Crohn's disease Brother    Depression Mother    Anxiety disorder Mother    Depression Sister    Aneurysm Father    Colon cancer Neg Hx     MENTAL STATUS EXAM (MSE)  Mental Status Exam: General Appearance: Fairly Groomed  Orientation:  Full (Time, Place, and Person)  Memory:  Immediate;   Fair Recent;   Poor Remote;   Poor  Concentration:  Concentration: Fair and Attention Span: Fair  Recall:  Fair  Attention  Fair  Eye Contact:  Fair  Speech:  Clear and Coherent  Language:  Fair  Volume:  Normal  Mood: I am fine  Affect:  Non-Congruent  Thought Process:  Goal Directed  Thought Content:  Logical  Suicidal Thoughts:  No  Homicidal Thoughts:  No  Judgement:  Poor  Insight:  Lacking  Psychomotor Activity:  Increased  Akathisia:  No  Fund of Knowledge:  Poor    Assets:  Desire for Improvement  Cognition:  Impaired,  Moderate  ADL's:  Impaired  AIMS (if indicated):       VITALS  Blood pressure (!) 186/86, pulse 92, temperature 98 F (36.7 C), temperature source Oral, resp. rate 18, height 5' 2 (1.575 m), weight 59.9 kg, SpO2 100%.  LABS  Admission on 04/08/2024  Component Date Value Ref Range Status   Sodium 04/08/2024 141  135 - 145 mmol/L Final   Potassium 04/08/2024 3.0 (L)  3.5 - 5.1 mmol/L Final   Chloride 04/08/2024 108  98 - 111 mmol/L Final   CO2 04/08/2024 22  22 - 32 mmol/L Final   Glucose, Bld 04/08/2024 78  70 - 99 mg/dL Final   Glucose reference range applies only to samples taken after fasting for at least 8 hours.   BUN 04/08/2024 11  8 - 23 mg/dL Final   Creatinine, Ser 04/08/2024 1.33 (H)  0.44 - 1.00 mg/dL Final   Calcium  04/08/2024 9.4  8.9 - 10.3 mg/dL Final   Total Protein 89/97/7974 6.2 (L)  6.5 - 8.1 g/dL Final   Albumin  04/08/2024 3.5  3.5 - 5.0 g/dL Final   AST 89/97/7974 29  15 - 41 U/L Final   ALT 04/08/2024 16  0 - 44 U/L Final   Alkaline Phosphatase  04/08/2024 79  38 - 126 U/L  Final   Total Bilirubin 04/08/2024 1.2  0.0 - 1.2 mg/dL Final   GFR, Estimated 04/08/2024 43 (L)  >60 mL/min Final   Comment: (NOTE) Calculated using the CKD-EPI Creatinine Equation (2021)    Anion gap 04/08/2024 11  5 - 15 Final   Performed at [facility], [address] [place], [place] [address]   Alcohol , Ethyl (B) 04/08/2024 <15  <15 mg/dL Final   Comment: (NOTE) For medical purposes only. Performed at [facility], [address] [place], [place] [address]    WBC 04/08/2024 3.7 (L)  4.0 - 10.5 K/uL Final   RBC 04/08/2024 3.17 (L)  3.87 - 5.11 MIL/uL Final   Hemoglobin 04/08/2024 10.3 (L)  12.0 - 15.0 g/dL Final   HCT 89/97/7974 29.8 (L)  36.0 - 46.0 % Final   MCV 04/08/2024 94.0  80.0 - 100.0 fL Final   MCH 04/08/2024 32.5  26.0 - 34.0 pg Final   MCHC 04/08/2024 34.6  30.0 - 36.0 g/dL Final   RDW 89/97/7974 12.5  11.5 - 15.5 % Final   Platelets 04/08/2024 154  150 - 400 K/uL Final   nRBC 04/08/2024 0.0  0.0 - 0.2 % Final   Neutrophils Relative % 04/08/2024 47  % Final   Neutro Abs 04/08/2024 1.8  1.7 - 7.7 K/uL Final   Lymphocytes Relative 04/08/2024 40  % Final   Lymphs Abs 04/08/2024 1.5  0.7 - 4.0 K/uL Final   Monocytes Relative 04/08/2024 8  % Final   Monocytes Absolute 04/08/2024 0.3  0.1 - 1.0 K/uL Final   Eosinophils Relative 04/08/2024 4  % Final   Eosinophils Absolute 04/08/2024 0.2  0.0 - 0.5 K/uL Final   Basophils Relative 04/08/2024 0  % Final   Basophils Absolute 04/08/2024 0.0  0.0 - 0.1 K/uL Final   Immature Granulocytes 04/08/2024 1  % Final   Abs Immature Granulocytes 04/08/2024 0.02  0.00 - 0.07 K/uL Final   Performed at [facility], [address], [place], [place] [address]   SARS Coronavirus 2 by RT PCR 04/08/2024 NEGATIVE  NEGATIVE Final   Performed at [facility], [address] [place], [place] [address]    PSYCHIATRIC REVIEW OF SYSTEMS (ROS)  ROS: Notable for the following relevant  positive findings: Review of Systems  Psychiatric/Behavioral:  Positive for depression. Negative for hallucinations, memory loss, substance abuse and suicidal ideas. The patient is nervous/anxious. The patient does not have insomnia.     Additional findings:      Musculoskeletal: No abnormal movements observed      Gait & Station: Wheelchair/Walker      Pain Screening: Present - severe (will consider referral for ongoing evaluation and treatment)      Nutrition & Dental Concerns: Decrease in food intake and/or loss of appetite  RISK FORMULATION/ASSESSMENT  Is the patient experiencing any suicidal or homicidal ideations: Yes       Explain if yes: patient has been making suicidal statements Protective factors considered for safety management: Current care in a highly monitored health care setting  Risk factors/concerns considered for safety management:  Depression Physical illness/chronic pain Access to lethal means Age over 33 Impulsivity Isolation Barriers to accessing treatment Unwillingness to seek help  Is there a safety management plan with the patient and treatment team to minimize risk factors and promote protective factors: Yes           Explain: psychiatric hospitalization Is crisis care placement or  psychiatric hospitalization recommended: Yes     Based on my current evaluation and risk assessment, patient is determined at this time to be at:  High risk  *RISK ASSESSMENT Risk assessment is a dynamic process; it is possible that this patient's condition, and risk level, may change. This should be re-evaluated and managed over time as appropriate. Please re-consult psychiatric consult services if additional assistance is needed in terms of risk assessment and management. If your team decides to discharge this patient, please advise the patient how to best access emergency psychiatric services, or to call 911, if their condition worsens or they feel unsafe in any way.   Charlene Buba, MD Telepsychiatry Consult Services

## 2024-04-09 ENCOUNTER — Inpatient Hospital Stay

## 2024-04-09 ENCOUNTER — Emergency Department (HOSPITAL_COMMUNITY)

## 2024-04-09 DIAGNOSIS — K7460 Unspecified cirrhosis of liver: Secondary | ICD-10-CM | POA: Diagnosis present

## 2024-04-09 DIAGNOSIS — D333 Benign neoplasm of cranial nerves: Secondary | ICD-10-CM | POA: Diagnosis present

## 2024-04-09 DIAGNOSIS — F4325 Adjustment disorder with mixed disturbance of emotions and conduct: Secondary | ICD-10-CM | POA: Diagnosis not present

## 2024-04-09 DIAGNOSIS — N3000 Acute cystitis without hematuria: Secondary | ICD-10-CM | POA: Diagnosis present

## 2024-04-09 DIAGNOSIS — G9341 Metabolic encephalopathy: Secondary | ICD-10-CM | POA: Insufficient documentation

## 2024-04-09 DIAGNOSIS — F419 Anxiety disorder, unspecified: Secondary | ICD-10-CM | POA: Diagnosis present

## 2024-04-09 DIAGNOSIS — Z882 Allergy status to sulfonamides status: Secondary | ICD-10-CM | POA: Diagnosis not present

## 2024-04-09 DIAGNOSIS — N1832 Chronic kidney disease, stage 3b: Secondary | ICD-10-CM | POA: Diagnosis present

## 2024-04-09 DIAGNOSIS — E039 Hypothyroidism, unspecified: Secondary | ICD-10-CM | POA: Diagnosis present

## 2024-04-09 DIAGNOSIS — Z79890 Hormone replacement therapy (postmenopausal): Secondary | ICD-10-CM | POA: Diagnosis not present

## 2024-04-09 DIAGNOSIS — Z7901 Long term (current) use of anticoagulants: Secondary | ICD-10-CM | POA: Diagnosis not present

## 2024-04-09 DIAGNOSIS — D849 Immunodeficiency, unspecified: Secondary | ICD-10-CM | POA: Diagnosis present

## 2024-04-09 DIAGNOSIS — Z96651 Presence of right artificial knee joint: Secondary | ICD-10-CM | POA: Diagnosis present

## 2024-04-09 DIAGNOSIS — Z1152 Encounter for screening for COVID-19: Secondary | ICD-10-CM | POA: Diagnosis not present

## 2024-04-09 DIAGNOSIS — B9620 Unspecified Escherichia coli [E. coli] as the cause of diseases classified elsewhere: Secondary | ICD-10-CM | POA: Diagnosis present

## 2024-04-09 DIAGNOSIS — I129 Hypertensive chronic kidney disease with stage 1 through stage 4 chronic kidney disease, or unspecified chronic kidney disease: Secondary | ICD-10-CM | POA: Diagnosis present

## 2024-04-09 DIAGNOSIS — R410 Disorientation, unspecified: Secondary | ICD-10-CM | POA: Diagnosis not present

## 2024-04-09 DIAGNOSIS — G9340 Encephalopathy, unspecified: Secondary | ICD-10-CM | POA: Diagnosis not present

## 2024-04-09 DIAGNOSIS — F32A Depression, unspecified: Secondary | ICD-10-CM | POA: Diagnosis present

## 2024-04-09 DIAGNOSIS — R45851 Suicidal ideations: Secondary | ICD-10-CM | POA: Diagnosis present

## 2024-04-09 DIAGNOSIS — D509 Iron deficiency anemia, unspecified: Secondary | ICD-10-CM | POA: Diagnosis present

## 2024-04-09 DIAGNOSIS — Z79899 Other long term (current) drug therapy: Secondary | ICD-10-CM | POA: Diagnosis not present

## 2024-04-09 DIAGNOSIS — Z87440 Personal history of urinary (tract) infections: Secondary | ICD-10-CM | POA: Diagnosis not present

## 2024-04-09 DIAGNOSIS — Z8659 Personal history of other mental and behavioral disorders: Secondary | ICD-10-CM | POA: Diagnosis not present

## 2024-04-09 DIAGNOSIS — F411 Generalized anxiety disorder: Secondary | ICD-10-CM | POA: Diagnosis present

## 2024-04-09 DIAGNOSIS — F22 Delusional disorders: Secondary | ICD-10-CM | POA: Diagnosis present

## 2024-04-09 DIAGNOSIS — F329 Major depressive disorder, single episode, unspecified: Secondary | ICD-10-CM | POA: Diagnosis present

## 2024-04-09 DIAGNOSIS — K5010 Crohn's disease of large intestine without complications: Secondary | ICD-10-CM | POA: Diagnosis present

## 2024-04-09 DIAGNOSIS — Z9049 Acquired absence of other specified parts of digestive tract: Secondary | ICD-10-CM | POA: Diagnosis not present

## 2024-04-09 HISTORY — DX: Metabolic encephalopathy: G93.41

## 2024-04-09 LAB — URINALYSIS, W/ REFLEX TO CULTURE (INFECTION SUSPECTED)
Bilirubin Urine: NEGATIVE
Glucose, UA: NEGATIVE mg/dL
Ketones, ur: NEGATIVE mg/dL
Nitrite: NEGATIVE
Protein, ur: NEGATIVE mg/dL
Specific Gravity, Urine: 1.005 (ref 1.005–1.030)
WBC, UA: >50 WBC/hpf (ref 0–5)
pH: 5 (ref 5.0–8.0)

## 2024-04-09 LAB — URINALYSIS, ROUTINE W REFLEX MICROSCOPIC
Bilirubin Urine: NEGATIVE
Glucose, UA: NEGATIVE mg/dL
Ketones, ur: 5 mg/dL — AB
Nitrite: POSITIVE — AB
Protein, ur: NEGATIVE mg/dL
Specific Gravity, Urine: 1.005 (ref 1.005–1.030)
WBC, UA: >50 WBC/hpf (ref 0–5)
pH: 5 (ref 5.0–8.0)

## 2024-04-09 LAB — RAPID URINE DRUG SCREEN, HOSP PERFORMED
Amphetamines: NOT DETECTED
Barbiturates: NOT DETECTED
Benzodiazepines: NOT DETECTED
Cocaine: NOT DETECTED
Opiates: NOT DETECTED
Tetrahydrocannabinol: NOT DETECTED

## 2024-04-09 MED ORDER — GABAPENTIN 100 MG PO CAPS
100.0000 mg | ORAL_CAPSULE | Freq: Once | ORAL | Status: AC
  Administered 2024-04-09: 100 mg via ORAL
  Filled 2024-04-09: qty 1

## 2024-04-09 MED ORDER — METOPROLOL SUCCINATE ER 25 MG PO TB24
25.0000 mg | ORAL_TABLET | Freq: Every day | ORAL | Status: DC
  Administered 2024-04-09 – 2024-04-11 (×3): 25 mg via ORAL
  Filled 2024-04-09 (×3): qty 1

## 2024-04-09 MED ORDER — ROSUVASTATIN CALCIUM 5 MG PO TABS
10.0000 mg | ORAL_TABLET | Freq: Every day | ORAL | Status: DC
  Administered 2024-04-09 – 2024-04-11 (×3): 10 mg via ORAL
  Filled 2024-04-09 (×3): qty 2

## 2024-04-09 MED ORDER — LEVOTHYROXINE SODIUM 50 MCG PO TABS
100.0000 ug | ORAL_TABLET | Freq: Every day | ORAL | Status: DC
  Administered 2024-04-10 – 2024-04-11 (×2): 100 ug via ORAL
  Filled 2024-04-09 (×2): qty 2

## 2024-04-09 MED ORDER — SODIUM CHLORIDE 0.9 % IV SOLN
1.0000 g | INTRAVENOUS | Status: DC
Start: 2024-04-09 — End: 2024-04-11
  Administered 2024-04-09 – 2024-04-11 (×3): 1 g via INTRAVENOUS
  Filled 2024-04-09 (×3): qty 10

## 2024-04-09 MED ORDER — ENOXAPARIN SODIUM 40 MG/0.4ML IJ SOSY: 40.0000 mg | PREFILLED_SYRINGE | INTRAMUSCULAR | Status: DC

## 2024-04-09 NOTE — ED Notes (Signed)
 Pt unable to provide urine sample, urine cup provided, pt instructed to provide urine sample

## 2024-04-09 NOTE — H&P (Signed)
 History and Physical    Patient: Jessica [name] FMW:[id number] DOB: Oct 14, 1950 DOA: 04/08/2024 DOS: the patient was seen and examined on 04/09/2024 PCP: [name], MD  Patient coming from: Home  Chief Complaint:  Chief Complaint  Patient presents with   Anxiety   Mental Health Eval   HPI: Jessica [name] is a 73 y.o. female with medical history significant of Crohn's disease with prior right hemicolectomy, immunocompromised, leukopenia, PE on anticoagulation, cirrhosis by imaging, hypertension, CKD stage IIIb, hypothyroidism, iron deficiency anemia, mood disorder p/w acute encephalopathy and was involuntarily committed per psychiatry for suicidal threats, paranoid delusions, auditory and visual perceptual disturbances, and impulsive dangerous behaviors.   Pt is not a reliable historian, but endorsed having confusion and reported being unable to take it anymore, leading to self-admittance to the hospital. The patient denied being brought in by someone else and confirmed driving themselves. The patient reported a history of frequent urinary tract infections, with the last episode occurring approximately seven months ago, during which hospitalization was required for treatment. Per discussion with her son, Lonni via phone, he notes that while he  not present at the time of admission, he had communicated with her via phone, and she mentioned not taking her medications for the past two days; of note, she also endorsed having hallucinations for several weeks, namely, she believed that everyone was out to get her.   In the ED, pt AFVSS. Labs notable for K 3.0, Cr 1.33, and WBC 3.7. UA positive leukocyte esterase ,nitrite, and many bacteria. CTH w/ no acute disease, but did show subtle hazy white matter low-attenuation over the inferior right occipital lobe adjacent the posterior aspect of the right lateral ventricle likely due to chronic ischemic microvascular disease. EDP started IV CTX  and requested medicine admission.    Review of Systems: As mentioned in the history of present illness. All other systems reviewed and are negative. Past Medical History:  Diagnosis Date   Anxiety attack    Arthritis    Back pain, chronic    Chronic kidney disease    Crohn's colitis (HCC)    Depression (emotion)    Dysrhythmia    GERD (gastroesophageal reflux disease)    Hypertension    Hypothyroid    Iron deficiency anemia, unspecified    Nausea and vomiting 10/22/2023   Neuropathy    Sleep apnea    cpap is not working per patient - needs to find a new doctor - not used since 7/16 per pat    Stage 3 chronic kidney disease (HCC)    Vertigo    followed by Dr Kievie- in [place]    Past Surgical History:  Procedure Laterality Date   ABDOMINAL HYSTERECTOMY     BIOPSY  06/08/2018   Procedure: BIOPSY;  Surgeon: Golda Claudis PENNER, MD;  [facility]: [facility];  Service: Endoscopy;;  (colon)   BIOPSY  04/01/2023   Procedure: BIOPSY;  Surgeon: Eartha Angelia Sieving, MD;  [facility]: [facility];  Service: Gastroenterology;;   CHOLECYSTECTOMY     COLON SURGERY     for Crohn's in the 80s in [place]    COLONOSCOPY N/A 03/11/2013   Procedure: COLONOSCOPY;  Surgeon: Claudis PENNER Golda, MD;  [facility]: [facility];  Service: Endoscopy;  Laterality: N/A;  1200   COLONOSCOPY N/A 06/08/2018   Procedure: COLONOSCOPY;  Surgeon: Golda Claudis PENNER, MD;  [facility];  Service: Endoscopy;  Laterality: N/A;  8:25   COLONOSCOPY WITH PROPOFOL  N/A 04/01/2023  Procedure: COLONOSCOPY WITH PROPOFOL ;  Surgeon: Eartha Angelia Sieving, MD;  [facility];  Service: Gastroenterology;  Laterality: N/A;   ESOPHAGEAL BRUSHING  04/01/2023   Procedure: ESOPHAGEAL BRUSHING;  Surgeon: Eartha Angelia Sieving, MD;  [facility];  Service: Gastroenterology;;   ESOPHAGOGASTRODUODENOSCOPY N/A 10/07/2023   Procedure: EGD (ESOPHAGOGASTRODUODENOSCOPY);  Surgeon: Eartha Angelia, Sieving, MD;   [facility];  Service: Gastroenterology;  Laterality: N/A;  215[PM, ASA 3, pt knows to arrive at 6:15   ESOPHAGOGASTRODUODENOSCOPY N/A 10/23/2023   Procedure: EGD (ESOPHAGOGASTRODUODENOSCOPY);  Surgeon: Dianna Specking, MD;  Location: [facility];  Service: Gastroenterology;  Laterality: N/A;   ESOPHAGOGASTRODUODENOSCOPY (EGD) WITH PROPOFOL  N/A 04/01/2023   Procedure: ESOPHAGOGASTRODUODENOSCOPY (EGD) WITH PROPOFOL ;  Surgeon: Eartha Angelia Sieving, MD;  [facility];  Service: Gastroenterology;  Laterality: N/A;  1:00am;asa 3   fatty tumor removed     HARDWARE REMOVAL Right 08/07/2016   Procedure: HARDWARE REMOVAL;  Surgeon: Dempsey Moan, MD;  Location: [facility];  Service: Orthopedics;  Laterality: Right;   Hip replacement      Rt hip in 2010 in [place]   KNEE ARTHROSCOPY Left 03/29/2015   Procedure: ARTHROSCOPY LEFT KNEE WITH MENSICAL DEBRIDEMENT, chondroplasty;  Surgeon: Dempsey Moan, MD;  Location: [facility];  Service: Orthopedics;  Laterality: Left;   ORIF TIBIA PLATEAU Right 02/29/2016   Procedure: OPEN REDUCTION INTERNAL FIXATION (ORIF RIGHT  TIBIAL PLATEAU FRACTURE;  Surgeon: Dempsey Moan, MD;  Location: [facility];  Service: Orthopedics;  Laterality: Right;   POLYPECTOMY  04/01/2023   Procedure: POLYPECTOMY;  Surgeon: Eartha Angelia Sieving, MD;  [facility];  Service: Gastroenterology;;   TEE WITHOUT CARDIOVERSION N/A 03/27/2021   Procedure: TRANSESOPHAGEAL ECHOCARDIOGRAM (TEE);  Surgeon: Alvan Dorn FALCON, MD;  [facility];  Service: Endoscopy;  Laterality: N/A;   TOTAL KNEE ARTHROPLASTY Right 08/25/2017   Procedure: TOTAL RIGHT KNEE ARTHROPLASTY;  Surgeon: Moan Dempsey, MD;  Location: [facility];  Service: Orthopedics;  Laterality: Right;   Social History:  reports that she has never smoked. She has never used smokeless tobacco. She reports that she does not drink alcohol  and does not use drugs.  Allergies  Allergen Reactions   Entyvio   [Vedolizumab ] Shortness Of Breath and Nausea And Vomiting    Joint pain   Robaxin  [Methocarbamol ] Itching   Sulfa Antibiotics Itching    ALL SULFA DRUGS    Family History  Problem Relation Age of Onset   Crohn's disease Brother    Depression Mother    Anxiety disorder Mother    Depression Sister    Aneurysm Father    Colon cancer Neg Hx     Prior to Admission medications   Medication Sig Start Date End Date Taking? Authorizing Provider  acetaminophen  (TYLENOL ) 325 MG tablet Take 2 tablets (650 mg total) by mouth every 6 (six) hours as needed for mild pain (pain score 1-3), fever or headache (or Fever >/= 101). 07/16/23   Pearlean Manus, MD  Albuterol -Budesonide (AIRSUPRA ) 90-80 MCG/ACT AERO Inhale 2 puffs into the lungs every 8 (eight) hours. 07/22/23   Jude Harden GAILS, MD  ALPRAZolam  (XANAX ) 0.5 MG tablet Take 1 tablet (0.5 mg total) by mouth 2 (two) times daily as needed. 02/13/24   Okey Barnie SAUNDERS, MD  apixaban  (ELIQUIS ) 5 MG TABS tablet Take 1 tablet (5 mg total) by mouth 2 (two) times daily. 08/13/23   Pearlean Manus, MD  Azelastine HCl 137 MCG/SPRAY SOLN Place 2 sprays into both nostrils 2 (two) times  daily as needed (Congestion/ Allergies). 07/17/23   [provider]  baclofen (LIORESAL) 10 MG tablet Take 10 mg by mouth 2 (two) times daily as needed. 12/09/23   [provider]  buPROPion  (WELLBUTRIN  XL) 300 MG 24 hr tablet Take 1 tablet (300 mg total) by mouth every morning. 02/13/24 02/12/25  Okey Barnie SAUNDERS, MD  calcitRIOL (ROCALTROL) 0.25 MCG capsule Take 0.25 mcg by mouth daily. 10/26/23   [provider]  cyanocobalamin  (VITAMIN B12) 1000 MCG/ML injection Inject 1,000 mcg into the muscle once a week.    [provider]  cyclobenzaprine (FLEXERIL) 5 MG tablet Take 5 mg by mouth 3 (three) times daily as needed. 12/06/23   [provider]  dicyclomine  (BENTYL ) 10 MG capsule Take 1 capsule (10 mg total) by mouth 2 (two) times daily as needed for  spasms. 02/26/24   Kennedy Charmaine CROME, NP  escitalopram  (LEXAPRO ) 10 MG tablet Take 1 tablet (10 mg total) by mouth daily. 02/13/24 02/12/25  Okey Barnie SAUNDERS, MD  ferrous sulfate  325 (65 FE) MG EC tablet Take 1 tablet (325 mg total) by mouth daily with breakfast. 03/01/24   Geofm Delon BRAVO, NP  gabapentin  (NEURONTIN ) 300 MG capsule Take 1 capsule (300 mg total) by mouth 3 (three) times daily. 12/22/23   Hyatt, Max T, DPM  levothyroxine  (SYNTHROID ) 100 MCG tablet Take 100 mcg by mouth daily. 01/09/22   [provider]  loperamide  (IMODIUM ) 2 MG capsule Take 1 capsule (2 mg total) by mouth as needed for diarrhea or loose stools. 02/26/24   Kennedy Charmaine CROME, NP  mesalamine  (APRISO ) 0.375 g 24 hr capsule Take 4 capsules (1.5 g total) by mouth daily. 03/05/24   Eartha Angelia Sieving, MD  methocarbamol  (ROBAXIN ) 500 MG tablet Take 500 mg by mouth 3 (three) times daily as needed. 12/15/23   [provider]  metoprolol  succinate (TOPROL -XL) 25 MG 24 hr tablet Take 25 mg by mouth daily. 01/13/24   [provider]  Multiple Vitamin (MULTIVITAMIN WITH MINERALS) TABS tablet Take 1 tablet by mouth daily.    [provider]  ondansetron  (ZOFRAN -ODT) 4 MG disintegrating tablet Take 1 tablet (4 mg total) by mouth every 8 (eight) hours as needed. 02/26/24   Kennedy Charmaine CROME, NP  pantoprazole  (PROTONIX ) 40 MG tablet Take 1 tablet (40 mg total) by mouth daily. 02/26/24   Kennedy Charmaine CROME, NP  Risankizumab -rzaa (SKYRIZI ) 180 MG/1.2ML SOCT Inject 180 mg into the skin every 8 (eight) weeks. 04/06/24   Kennedy Charmaine CROME, NP  Risankizumab -rzaa (SKYRIZI ) 180 MG/1.2ML SOCT Inject 180 mg into the skin every 8 (eight) weeks. Starting at week 12 on the week of 9/30 04/06/24   Kennedy Charmaine CROME, NP  rOPINIRole  (REQUIP ) 1 MG tablet Take 3 mg by mouth at bedtime. 03/18/23   [provider]  rosuvastatin  (CRESTOR ) 10 MG tablet Take 10 mg by mouth daily. 02/23/24   [provider]  sodium  bicarbonate 650 MG tablet Take 650 mg by mouth 2 (two) times daily.    [provider]  thiamine (VITAMIN B-1) 100 MG tablet Take 100 mg by mouth daily.    [provider]  thiamine (VITAMIN B1) 100 MG tablet Take 100 mg by mouth daily. 11/27/23 11/21/24  [provider]    Physical Exam: Vitals:   04/09/24 0612 04/09/24 0908 04/09/24 0959 04/09/24 1153  BP:  132/64 (!) 145/70 137/80  Pulse:  74 77 85  Resp: 18 16 16 16   Temp: ROLLEN)  97.5 F (36.4 C) 98 F (36.7 C) 98.4 F (36.9 C) 98.1 F (36.7 C)  TempSrc: Oral Axillary Oral Oral  SpO2:  100% 100% 100%  Weight:      Height:       General: Alert, oriented x3, resting comfortably in no acute distress Respiratory: Lungs clear to auscultation bilaterally with normal respiratory effort; no w/r/r Cardiovascular: Regular rate and rhythm w/o m/r/g    Data Reviewed:  Lab Results  Component Value Date   WBC 3.7 (L) 04/08/2024   HGB 10.3 (L) 04/08/2024   HCT 29.8 (L) 04/08/2024   MCV 94.0 04/08/2024   PLT 154 04/08/2024   Lab Results  Component Value Date   GLUCOSE 78 04/08/2024   CALCIUM  9.4 04/08/2024   NA 141 04/08/2024   K 3.0 (L) 04/08/2024   CO2 22 04/08/2024   CL 108 04/08/2024   BUN 11 04/08/2024   CREATININE 1.33 (H) 04/08/2024   Lab Results  Component Value Date   ALT 16 04/08/2024   AST 29 04/08/2024   ALKPHOS 79 04/08/2024   BILITOT 1.2 04/08/2024   Lab Results  Component Value Date   INR 0.9 02/16/2024   INR 1.1 01/14/2024   INR 1.4 (H) 10/18/2023   Radiology: CT Head Wo Contrast Result Date: 04/09/2024 CLINICAL DATA:  Delirium. EXAM: CT HEAD WITHOUT CONTRAST TECHNIQUE: Contiguous axial images were obtained from the base of the skull through the vertex without intravenous contrast. RADIATION DOSE REDUCTION: This exam was performed according to the departmental dose-optimization program which includes automated exposure control, adjustment of the mA and/or kV according to patient  size and/or use of iterative reconstruction technique. COMPARISON:  MRI brain 07/05/2019 FINDINGS: Brain: The ventricles, cisterns and other CSF spaces are normal. There is no mass, mass effect, shift of midline structures or acute hemorrhage. Subtle hazy white matter low-attenuation over the inferior right occipital lobe adjacent the posterior aspect of the right lateral ventricle likely due to chronic ischemic microvascular disease. Vascular: No hyperdense vessel or unexpected calcification. Skull: Normal. Negative for fracture or focal lesion. Sinuses/Orbits: Orbits are normal. Mild focal mucosal membrane thickening of the left maxillary sinus compatible chronic inflammatory change. Other: None. IMPRESSION: 1. No acute findings. 2. Subtle hazy white matter low-attenuation over the inferior right occipital lobe adjacent the posterior aspect of the right lateral ventricle likely due to chronic ischemic microvascular disease. Consider MRI for further evaluation. Electronically Signed   By: Toribio Agreste M.D.   On: 04/09/2024 09:14    Assessment and Plan: 23F h/o Crohn's disease with prior right hemicolectomy, immunocompromised, leukopenia, PE on anticoagulation, cirrhosis by imaging, hypertension, CKD stage IIIb, hypothyroidism, iron deficiency anemia, mood disorder p/w acute encephalopathy and was involuntarily committed per psychiatry for suicidal threats, paranoid delusions, auditory and visual perceptual disturbances, and impulsive dangerous behaviors.   Acute encephalopathy Delirium Presumed acute cystitis Abnl UA -Psych consulted and involuntarily committed; apprec eval/recs (may require IP psych admission after medicine stabilization per EDP signout) -Continue IV CTX 1g daily for presumed acute cystitis -F/u urine culture and titrate abx accordingly   VTE H/o PE -PTA apixaban  5mg  BID  Hypothyroidism -PTA Synthroid  100mcg daily   Advance Care Planning:   Code Status: Full Code    Consults: N/A  Family Communication: Husband and son  Severity of Illness: The appropriate patient status for this patient is INPATIENT. Inpatient status is judged to be reasonable and necessary in order to provide the required intensity of service to ensure the patient's safety.  The patient's presenting symptoms, physical exam findings, and initial radiographic and laboratory data in the context of their chronic comorbidities is felt to place them at high risk for further clinical deterioration. Furthermore, it is not anticipated that the patient will be medically stable for discharge from the hospital within 2 midnights of admission.   * I certify that at the point of admission it is my clinical judgment that the patient will require inpatient hospital care spanning beyond 2 midnights from the point of admission due to high intensity of service, high risk for further deterioration and high frequency of surveillance required.*   ------- I spent 55 minutes reviewing previous notes, at the bedside counseling/discussing the treatment plan, and performing clinical documentation.  Author: Marsha Ada, MD 04/09/2024 3:27 PM  For on call review [email or web address].

## 2024-04-09 NOTE — ED Notes (Signed)
 IVC paperwork moved to the yellow zone by assigned RN.  The patient is being transferred out.

## 2024-04-09 NOTE — ED Notes (Signed)
 Case Number: [id number]  The patient was IVC on 04/08/2024 3 copies of the IVC verified and current at the [facility] nurses station.

## 2024-04-09 NOTE — ED Provider Notes (Signed)
 Emergency Medicine Observation Re-evaluation Note  Jessica [name] is a 73 y.o. female, seen on rounds today.  Pt initially presented to the ED for complaints of Anxiety and Mental Health Eval Currently, the patient is sleeping  Physical Exam  BP (!) 162/74   Pulse 95   Temp (!) 97.5 F (36.4 C) (Oral)   Resp 18   Ht 5' 2 (1.575 m)   Wt 59.9 kg   SpO2 100%   BMI 24.14 kg/m  Physical Exam General: No acute distress Cardiac: Regular Lungs: No respiratory distress Psych: Calm and sleeping at this time  ED Course / MDM  EKG:EKG Interpretation Date/Time:  Thursday April 08 2024 14:05:22 EDT Ventricular Rate:  98 PR Interval:  188 QRS Duration:  72 QT Interval:  342 QTC Calculation: 436 R Axis:   142  Text Interpretation: Normal sinus rhythm Left posterior fascicular block Cannot rule out Anterior infarct , age undetermined Abnormal ECG When compared with ECG of 18-Oct-2023 14:29, PREVIOUS ECG IS PRESENT Confirmed by Darra Chew [phone]) on 04/08/2024 3:08:26 PM  I have reviewed the labs performed to date as well as medications administered while in observation.  Recent changes in the last 24 hours include evaluation by telepsychiatry who recommended inpatient admission.  Plan  Current plan is for inpatient psych admission.  Medications adjusted based on inpatient psychiatry's recommendations.  Patient's UA with questioning positive leukocytes, nitrates and greater than 50 white cells with hemoglobin.  Will send a urine culture but patient does not have any overt signs of infection at this time.    Doretha Folks, MD 04/09/24 [phone]

## 2024-04-09 NOTE — ED Notes (Signed)
 Pt transported to CT via stretcher and sitter accompanying.

## 2024-04-09 NOTE — ED Notes (Signed)
 Pt requesting pain meds, pacing the room, falling asleep standing up, frequent bathroom visits, displaying signs of agitation. Pt able to be redirected. RN made aware of requests and behaviors.

## 2024-04-09 NOTE — ED Notes (Signed)
 Report given to [place] RN.

## 2024-04-10 DIAGNOSIS — N3000 Acute cystitis without hematuria: Secondary | ICD-10-CM

## 2024-04-10 DIAGNOSIS — R45851 Suicidal ideations: Secondary | ICD-10-CM

## 2024-04-10 DIAGNOSIS — E039 Hypothyroidism, unspecified: Secondary | ICD-10-CM | POA: Diagnosis not present

## 2024-04-10 DIAGNOSIS — F22 Delusional disorders: Secondary | ICD-10-CM

## 2024-04-10 DIAGNOSIS — G9341 Metabolic encephalopathy: Secondary | ICD-10-CM | POA: Diagnosis not present

## 2024-04-10 HISTORY — DX: Acute cystitis without hematuria: N30.00

## 2024-04-10 HISTORY — DX: Suicidal ideations: R45.851

## 2024-04-10 LAB — CBC
HCT: 29.2 % — ABNORMAL LOW (ref 36.0–46.0)
Hemoglobin: 10.1 g/dL — ABNORMAL LOW (ref 12.0–15.0)
MCH: 32.6 pg (ref 26.0–34.0)
MCHC: 34.6 g/dL (ref 30.0–36.0)
MCV: 94.2 fL (ref 80.0–100.0)
Platelets: 145 K/uL — ABNORMAL LOW (ref 150–400)
RBC: 3.1 MIL/uL — ABNORMAL LOW (ref 3.87–5.11)
RDW: 12.6 % (ref 11.5–15.5)
WBC: 4.3 K/uL (ref 4.0–10.5)
nRBC: 0 % (ref 0.0–0.2)

## 2024-04-10 LAB — BASIC METABOLIC PANEL WITH GFR
Anion gap: 13 (ref 5–15)
BUN: 13 mg/dL (ref 8–23)
CO2: 19 mmol/L — ABNORMAL LOW (ref 22–32)
Calcium: 9.2 mg/dL (ref 8.9–10.3)
Chloride: 107 mmol/L (ref 98–111)
Creatinine, Ser: 1.37 mg/dL — ABNORMAL HIGH (ref 0.44–1.00)
GFR, Estimated: 41 mL/min — ABNORMAL LOW (ref >60)
Glucose, Bld: 119 mg/dL — ABNORMAL HIGH (ref 70–99)
Potassium: 3.4 mmol/L — ABNORMAL LOW (ref 3.5–5.1)
Sodium: 139 mmol/L (ref 135–145)

## 2024-04-10 MED ORDER — ROPINIROLE HCL 1 MG PO TABS
3.0000 mg | ORAL_TABLET | Freq: Every day | ORAL | Status: DC
  Administered 2024-04-11: 3 mg via ORAL
  Filled 2024-04-10 (×2): qty 3

## 2024-04-10 MED ORDER — POTASSIUM CHLORIDE 20 MEQ PO PACK
40.0000 meq | PACK | Freq: Once | ORAL | Status: AC
  Administered 2024-04-10: 40 meq via ORAL
  Filled 2024-04-10: qty 2

## 2024-04-10 NOTE — Hospital Course (Signed)
 73 y.o. female with medical history significant of Crohn's disease with prior right hemicolectomy, immunocompromised, leukopenia, PE on anticoagulation, cirrhosis by imaging, hypertension, CKD stage IIIb, hypothyroidism, iron deficiency anemia, mood disorder p/w acute encephalopathy and was involuntarily committed per psychiatry for suicidal threats, paranoid delusions, auditory and visual perceptual disturbances, and impulsive dangerous behaviors. Also suspected UTI.   Assessment and Plan:   Possible acute metabolic encephalopathy - History of mood disorder presenting with acute encephalopathy, paranoid delusions, disturbances, suicidal threats.  Currently IVC.  Ruling out metabolic factors such as UTI, as well as medication induced delirium.  Tox screen negative.  Holding benzos.  Continue to monitor closely.  Psych following closely.   Possible acute cystitis without hematuria - UA noting LE, nitrates, WBCs, bacteria.  Placed on empiric ceftriaxone .  Urine culture noting E. coli.  Susceptibility to follow.   Involuntary commitment - Presenting with suicidal threats, paranoid delusions, auditory and visual disturbances, impulsive behavior.  As mentioned above, ruling out underlying metabolic encephalopathic contributions from acute cystitis.  Once other metabolic issues are ruled out/resolved, will likely be reevaluated by psych with determination whether inpatient transfer is necessary.

## 2024-04-10 NOTE — Plan of Care (Signed)

## 2024-04-10 NOTE — Plan of Care (Signed)
 Pt calm. No suicidal or homicidal thoughts. Alert and oriented and cooperative Problem: Education: Goal: Knowledge of General Education information will improve Description: Including pain rating scale, medication(s)/side effects and non-pharmacologic comfort measures Outcome: Progressing   Problem: Health Behavior/Discharge Planning: Goal: Ability to manage health-related needs will improve Outcome: Progressing   Problem: Clinical Measurements: Goal: Ability to maintain clinical measurements within normal limits will improve Outcome: Progressing Goal: Will remain free from infection Outcome: Progressing Goal: Diagnostic test results will improve Outcome: Progressing Goal: Respiratory complications will improve Outcome: Progressing Goal: Cardiovascular complication will be avoided Outcome: Progressing   Problem: Activity: Goal: Risk for activity intolerance will decrease Outcome: Progressing   Problem: Nutrition: Goal: Adequate nutrition will be maintained Outcome: Progressing   Problem: Coping: Goal: Level of anxiety will decrease Outcome: Progressing   Problem: Elimination: Goal: Will not experience complications related to bowel motility Outcome: Progressing Goal: Will not experience complications related to urinary retention Outcome: Progressing   Problem: Pain Managment: Goal: General experience of comfort will improve and/or be controlled Outcome: Progressing   Problem: Safety: Goal: Ability to remain free from injury will improve Outcome: Progressing   Problem: Skin Integrity: Goal: Risk for impaired skin integrity will decrease Outcome: Progressing

## 2024-04-10 NOTE — Progress Notes (Signed)
 Progress Note   Patient: Jessica [name] FMW:[id number] DOB: April 28, 1951 DOA: 04/08/2024  DOS: the patient was seen and examined on 04/10/2024   Brief hospital course:  73 y.o. female with medical history significant of Crohn's disease with prior right hemicolectomy, immunocompromised, leukopenia, PE on anticoagulation, cirrhosis by imaging, hypertension, CKD stage IIIb, hypothyroidism, iron deficiency anemia, mood disorder p/w acute encephalopathy and was involuntarily committed per psychiatry for suicidal threats, paranoid delusions, auditory and visual perceptual disturbances, and impulsive dangerous behaviors. Also suspected UTI.  Assessment and Plan:  Possible acute metabolic encephalopathy - History of mood disorder presenting with acute encephalopathy, paranoid delusions, disturbances, suicidal threats.  Currently IVC.  Ruling out metabolic factors such as UTI, as well as medication induced delirium.  Tox screen negative.  Holding benzos.  Continue to monitor closely.  Psych following closely.  Possible acute cystitis without hematuria - UA noting LE, nitrates, WBCs, bacteria.  Placed on empiric ceftriaxone .  Urine culture noting E. coli.  Susceptibility to follow.  Involuntary commitment - Presenting with suicidal threats, paranoid delusions, auditory and visual disturbances, impulsive behavior.  As mentioned above, ruling out underlying metabolic encephalopathic contributions from acute cystitis.  Once other metabolic issues are ruled out/resolved, will likely be reevaluated by psych with determination whether inpatient transfer is necessary.   Subjective: Patient resting comfortably this morning.  States she feels better.  Denies any fever, shortness of breath, chest pain, nausea, vomiting, abdominal pain.  Denies difficulty with urination or burning with urination.  Somewhat lethargic/sleepy.  Physical Exam:  Vitals:   04/09/24 1153 04/09/24 1556 04/09/24 2101 04/10/24 0751  BP:  137/80 114/64 122/62 114/63  Pulse: 85 72 76 66  Resp: 16 16 16 16   Temp: 98.1 F (36.7 C) 98.9 F (37.2 C) 98.1 F (36.7 C) (!) 97.5 F (36.4 C)  TempSrc: Oral Oral  Oral  SpO2: 100% 97% 99% 99%  Weight:      Height:        GENERAL:  Alert, pleasant, sleepy HEENT:  EOMI CARDIOVASCULAR:  RRR, no murmurs appreciated RESPIRATORY:  Clear to auscultation, no wheezing, rales, or rhonchi GASTROINTESTINAL:  Soft, nontender, nondistended EXTREMITIES:  No LE edema bilaterally NEURO:  No new focal deficits appreciated SKIN:  No rashes noted PSYCH:  Appropriate mood and affect     Data Reviewed:  Imaging Studies: CT Head Wo Contrast Result Date: 04/09/2024 CLINICAL DATA:  Delirium. EXAM: CT HEAD WITHOUT CONTRAST TECHNIQUE: Contiguous axial images were obtained from the base of the skull through the vertex without intravenous contrast. RADIATION DOSE REDUCTION: This exam was performed according to the departmental dose-optimization program which includes automated exposure control, adjustment of the mA and/or kV according to patient size and/or use of iterative reconstruction technique. COMPARISON:  MRI brain 07/05/2019 FINDINGS: Brain: The ventricles, cisterns and other CSF spaces are normal. There is no mass, mass effect, shift of midline structures or acute hemorrhage. Subtle hazy white matter low-attenuation over the inferior right occipital lobe adjacent the posterior aspect of the right lateral ventricle likely due to chronic ischemic microvascular disease. Vascular: No hyperdense vessel or unexpected calcification. Skull: Normal. Negative for fracture or focal lesion. Sinuses/Orbits: Orbits are normal. Mild focal mucosal membrane thickening of the left maxillary sinus compatible chronic inflammatory change. Other: None. IMPRESSION: 1. No acute findings. 2. Subtle hazy white matter low-attenuation over the inferior right occipital lobe adjacent the posterior aspect of the right lateral  ventricle likely due to chronic ischemic microvascular disease. Consider MRI for further evaluation. Electronically  Signed   By: Toribio Agreste M.D.   On: 04/09/2024 09:14   DG Chest 1 View Result Date: 04/08/2024 CLINICAL DATA:  Medical clearance for psychiatric admission. EXAM: CHEST  1 VIEW COMPARISON:  10/18/2023 FINDINGS: The heart size and mediastinal contours are within normal limits. Stable calcified granuloma in the lateral left lung. There is no evidence of pulmonary edema, consolidation, pneumothorax or pleural fluid. The visualized skeletal structures are unremarkable. IMPRESSION: No active disease. Electronically Signed   By: Marcey Moan M.D.   On: 04/08/2024 14:14    There are no new results to review at this time.  Previous records (including but not limited to H&P, progress notes, nursing notes, TOC management) were reviewed in assessment of this patient.  Labs: CBC: Recent Labs  Lab 04/08/24 1358 04/10/24 0817  WBC 3.7* 4.3  NEUTROABS 1.8  --   HGB 10.3* 10.1*  HCT 29.8* 29.2*  MCV 94.0 94.2  PLT 154 145*   Basic Metabolic Panel: Recent Labs  Lab 04/08/24 1358 04/10/24 0817  NA 141 139  K 3.0* 3.4*  CL 108 107  CO2 22 19*  GLUCOSE 78 119*  BUN 11 13  CREATININE 1.33* 1.37*  CALCIUM  9.4 9.2   Liver Function Tests: Recent Labs  Lab 04/08/24 1358  AST 29  ALT 16  ALKPHOS 79  BILITOT 1.2  PROT 6.2*  ALBUMIN  3.5   CBG: No results for input(s): GLUCAP in the last 168 hours.  Scheduled Meds:  apixaban   5 mg Oral BID   buPROPion   300 mg Oral BH-q7a   calcitRIOL  0.25 mcg Oral Daily   escitalopram   10 mg Oral Daily   levothyroxine   100 mcg Oral Q0600   metoprolol  succinate  25 mg Oral Daily   rosuvastatin   10 mg Oral Daily   Continuous Infusions:  cefTRIAXone  (ROCEPHIN )  IV 1 g (04/10/24 0853)   PRN Meds:.acetaminophen , baclofen, OLANZapine **OR** OLANZapine  Family Communication: None at bedside  Disposition: Status is:  Inpatient Remains inpatient appropriate because: UTI/encephalopathy/IVC     Time spent: 38 minutes  Length of inpatient stay: 1 days  Author: Carliss LELON Canales, DO 04/10/2024 11:31 AM  For on call review [email or web address].
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
## 2024-04-11 ENCOUNTER — Inpatient Hospital Stay (HOSPITAL_COMMUNITY)

## 2024-04-11 ENCOUNTER — Other Ambulatory Visit (HOSPITAL_COMMUNITY): Payer: Self-pay

## 2024-04-11 DIAGNOSIS — F22 Delusional disorders: Secondary | ICD-10-CM | POA: Diagnosis not present

## 2024-04-11 DIAGNOSIS — F4325 Adjustment disorder with mixed disturbance of emotions and conduct: Secondary | ICD-10-CM

## 2024-04-11 DIAGNOSIS — N3000 Acute cystitis without hematuria: Secondary | ICD-10-CM | POA: Diagnosis not present

## 2024-04-11 DIAGNOSIS — Z8659 Personal history of other mental and behavioral disorders: Secondary | ICD-10-CM

## 2024-04-11 DIAGNOSIS — G9341 Metabolic encephalopathy: Secondary | ICD-10-CM | POA: Diagnosis not present

## 2024-04-11 DIAGNOSIS — F411 Generalized anxiety disorder: Secondary | ICD-10-CM

## 2024-04-11 DIAGNOSIS — R410 Disorientation, unspecified: Secondary | ICD-10-CM

## 2024-04-11 DIAGNOSIS — E039 Hypothyroidism, unspecified: Secondary | ICD-10-CM | POA: Diagnosis not present

## 2024-04-11 LAB — BASIC METABOLIC PANEL WITH GFR
Anion gap: 11 (ref 5–15)
BUN: 15 mg/dL (ref 8–23)
CO2: 21 mmol/L — ABNORMAL LOW (ref 22–32)
Calcium: 9.1 mg/dL (ref 8.9–10.3)
Chloride: 104 mmol/L (ref 98–111)
Creatinine, Ser: 1.38 mg/dL — ABNORMAL HIGH (ref 0.44–1.00)
GFR, Estimated: 41 mL/min — ABNORMAL LOW (ref >60)
Glucose, Bld: 84 mg/dL (ref 70–99)
Potassium: 3.6 mmol/L (ref 3.5–5.1)
Sodium: 136 mmol/L (ref 135–145)

## 2024-04-11 LAB — CBC
HCT: 29.6 % — ABNORMAL LOW (ref 36.0–46.0)
Hemoglobin: 10.2 g/dL — ABNORMAL LOW (ref 12.0–15.0)
MCH: 32.4 pg (ref 26.0–34.0)
MCHC: 34.5 g/dL (ref 30.0–36.0)
MCV: 94 fL (ref 80.0–100.0)
Platelets: 135 K/uL — ABNORMAL LOW (ref 150–400)
RBC: 3.15 MIL/uL — ABNORMAL LOW (ref 3.87–5.11)
RDW: 12.4 % (ref 11.5–15.5)
WBC: 5.3 K/uL (ref 4.0–10.5)
nRBC: 0 % (ref 0.0–0.2)

## 2024-04-11 LAB — URINE CULTURE: Culture: >=100000 — AB

## 2024-04-11 MED ORDER — ESCITALOPRAM OXALATE 10 MG PO TABS
10.0000 mg | ORAL_TABLET | Freq: Every day | ORAL | 0 refills | Status: DC
  Filled 2024-04-11: qty 30, 30d supply, fill #0

## 2024-04-11 MED ORDER — OLANZAPINE 2.5 MG PO TABS
2.5000 mg | ORAL_TABLET | Freq: Every day | ORAL | 0 refills | Status: AC
  Filled 2024-04-11: qty 30, 30d supply, fill #0

## 2024-04-11 MED ORDER — CEPHALEXIN 500 MG PO CAPS
500.0000 mg | ORAL_CAPSULE | Freq: Two times a day (BID) | ORAL | 0 refills | Status: AC
  Filled 2024-04-11: qty 10, 5d supply, fill #0

## 2024-04-11 MED ORDER — BUPROPION HCL ER (XL) 300 MG PO TB24
300.0000 mg | ORAL_TABLET | ORAL | 0 refills | Status: DC
  Filled 2024-04-11: qty 30, 30d supply, fill #0

## 2024-04-11 NOTE — Progress Notes (Signed)
 Reviewed AVS, patient expressed understanding of medications, MD follow up reviewed.   Patient states all belongings brought to the hospital at time of admission are accounted for and packed to take home. Items returned to patient that were sealed upon admission.  Picked up medications from [facility] pharmacy. Staff escorted patient to ED Dept entrance where family member was waiting in vehicle to transport home.
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
## 2024-04-11 NOTE — TOC Progression Note (Signed)
 Per psych APP, pt no longer meets IVC criteria. Completed IVC release uploaded to efile case # [id number].   Julien Das, MSW, LCSW [id number] (coverage)

## 2024-04-11 NOTE — Consult Note (Signed)
 Client admitted for delirium with paranoia, diagnosed with a UTI and treated.  Her confusion and paranoia resolved with no threats to self or others.  Follow up with her regular psychiatrist, Dr. Okey.  IVC released.  Jessica [name], PMHNP

## 2024-04-11 NOTE — Discharge Summary (Signed)
 Physician Discharge Summary   Patient: Jessica [name] MRN: [id number] DOB: June 21, 1951  Admit date:     04/08/2024  Discharge date: 04/11/24  Discharge Physician: Carliss LELON Canales   PCP: [name], MD   Recommendations at discharge:    Pt to be discharged home.   If you experience worsening fever, chills, chest pain, shortness of breath, or other concerning symptoms, please call your PCP or go to the emergency department immediately.  Discharge Diagnoses: Active Problems:   Hypothyroidism   Acute metabolic encephalopathy   Acute cystitis without hematuria   Paranoid behavior (HCC)   Suicidal ideation  Resolved Problems:   * No resolved hospital problems. *   Hospital Course:  73 y.o. female with medical history significant of Crohn's disease with prior right hemicolectomy, immunocompromised, leukopenia, PE on anticoagulation, cirrhosis by imaging, hypertension, CKD stage IIIb, hypothyroidism, iron deficiency anemia, mood disorder p/w acute encephalopathy and was involuntarily committed per psychiatry for suicidal threats, paranoid delusions, auditory and visual perceptual disturbances, and impulsive dangerous behaviors. Also suspected UTI.   Assessment and Plan:   Involuntary commitment - Presenting with suicidal threats, paranoid delusions, auditory and visual disturbances, impulsive behavior.  History of mood disorder presenting with acute encephalopathy, paranoid delusions, disturbances, suicidal threats.  Initially placed on IVC.  Ruling out metabolic factors such as UTI, as well as medication induced delirium.  Tox screen negative.  Discontinued benzos.  Psychiatry following closely.  Showed improvement throughout the course of hospital stay.  Was restarted on Wellbutrin , Lexapro , with Zyprexa 2.5 mg twice daily as needed paranoia.  Showing improvement this morning.  Recommendations by psychiatry to continue Wellbutrin  XL 300 mg a day, Lexapro  10 mg a day, 2.5 mg daily and  follow-up with Dr. Okey as an outpatient.    Possible acute cystitis without hematuria - UA noting LE, nitrates, WBCs, bacteria.  Urine culture noting pansensitive E. coli.  Initially placed on empiric ceftriaxone .  Will transition patient to p.o. Keflex  to take as directed upon discharge.  Consultants: Psychiatry Procedures performed: None Disposition: Home Diet recommendation:  Discharge Diet Orders (From admission, onward)     Start     Ordered   04/11/24 0000  Diet - low sodium heart healthy        04/11/24 1150           Regular diet  DISCHARGE MEDICATION: Allergies as of 04/11/2024       Reactions   Entyvio  [vedolizumab ] Shortness Of Breath, Nausea And Vomiting   Joint pain   Robaxin  [methocarbamol ] Itching   Sulfa Antibiotics Itching   ALL SULFA DRUGS        Medication List     STOP taking these medications    ALPRAZolam  0.5 MG tablet Commonly known as: XANAX    cyclobenzaprine 5 MG tablet Commonly known as: FLEXERIL   gabapentin  300 MG capsule Commonly known as: NEURONTIN    methocarbamol  500 MG tablet Commonly known as: ROBAXIN    thiamine 100 MG tablet Commonly known as: VITAMIN B1       TAKE these medications    acetaminophen  325 MG tablet Commonly known as: TYLENOL  Take 2 tablets (650 mg total) by mouth every 6 (six) hours as needed for mild pain (pain score 1-3), fever or headache (or Fever >/= 101).   Airsupra  90-80 MCG/ACT Aero Generic drug: Albuterol -Budesonide Inhale 2 puffs into the lungs every 8 (eight) hours.   apixaban  5 MG Tabs tablet Commonly known as: ELIQUIS  Take 1 tablet (5 mg total)  by mouth 2 (two) times daily.   Azelastine HCl 137 MCG/SPRAY Soln Place 2 sprays into both nostrils 2 (two) times daily as needed (Congestion/ Allergies).   baclofen 10 MG tablet Commonly known as: LIORESAL Take 10 mg by mouth 2 (two) times daily as needed.   buPROPion  300 MG 24 hr tablet Commonly known as: Wellbutrin  XL Take 1 tablet  (300 mg total) by mouth every morning.   calcitRIOL 0.25 MCG capsule Commonly known as: ROCALTROL Take 0.25 mcg by mouth daily.   cephALEXin  500 MG capsule Commonly known as: KEFLEX  Take 1 capsule (500 mg total) by mouth 2 (two) times daily for 5 days.   cyanocobalamin  1000 MCG/ML injection Commonly known as: VITAMIN B12 Inject 1,000 mcg into the muscle once a week.   dicyclomine  10 MG capsule Commonly known as: BENTYL  Take 1 capsule (10 mg total) by mouth 2 (two) times daily as needed for spasms.   escitalopram  10 MG tablet Commonly known as: Lexapro  Take 1 tablet (10 mg total) by mouth daily.   ferrous sulfate  325 (65 FE) MG EC tablet Take 1 tablet (325 mg total) by mouth daily with breakfast.   levothyroxine  100 MCG tablet Commonly known as: SYNTHROID  Take 100 mcg by mouth daily.   loperamide  2 MG capsule Commonly known as: IMODIUM  Take 1 capsule (2 mg total) by mouth as needed for diarrhea or loose stools.   mesalamine  0.375 g 24 hr capsule Commonly known as: Apriso  Take 4 capsules (1.5 g total) by mouth daily.   metoprolol  succinate 25 MG 24 hr tablet Commonly known as: TOPROL -XL Take 25 mg by mouth daily.   multivitamin with minerals Tabs tablet Take 1 tablet by mouth daily.   OLANZapine 2.5 MG tablet Commonly known as: ZYPREXA Take 1 tablet (2.5 mg total) by mouth daily.   ondansetron  4 MG disintegrating tablet Commonly known as: ZOFRAN -ODT Take 1 tablet (4 mg total) by mouth every 8 (eight) hours as needed.   pantoprazole  40 MG tablet Commonly known as: PROTONIX  Take 1 tablet (40 mg total) by mouth daily.   rOPINIRole  1 MG tablet Commonly known as: REQUIP  Take 3 mg by mouth at bedtime.   rosuvastatin  10 MG tablet Commonly known as: CRESTOR  Take 10 mg by mouth daily.   Skyrizi  180 MG/1.2ML Soct Generic drug: Risankizumab -rzaa Inject 180 mg into the skin every 8 (eight) weeks. Starting at week 12 on the week of 9/30 What changed: Another  medication with the same name was removed. Continue taking this medication, and follow the directions you see here.   sodium bicarbonate 650 MG tablet Take 650 mg by mouth 2 (two) times daily.   thiamine 100 MG tablet Commonly known as: Vitamin B-1 Take 100 mg by mouth daily.         Discharge Exam: Filed Weights   04/08/24 1302  Weight: 59.9 kg    GENERAL:  Alert, pleasant, no acute distress  HEENT:  EOMI CARDIOVASCULAR:  RRR, no murmurs appreciated RESPIRATORY:  Clear to auscultation, no wheezing, rales, or rhonchi GASTROINTESTINAL:  Soft, nontender, nondistended EXTREMITIES:  No LE edema bilaterally NEURO:  No new focal deficits appreciated SKIN:  No rashes noted PSYCH:  Appropriate mood and affect     Condition at discharge: improving  The results of significant diagnostics from this hospitalization (including imaging, microbiology, ancillary and laboratory) are listed below for reference.   Imaging Studies: CT Head Wo Contrast Result Date: 04/09/2024 CLINICAL DATA:  Delirium. EXAM: CT HEAD WITHOUT CONTRAST TECHNIQUE:  Contiguous axial images were obtained from the base of the skull through the vertex without intravenous contrast. RADIATION DOSE REDUCTION: This exam was performed according to the departmental dose-optimization program which includes automated exposure control, adjustment of the mA and/or kV according to patient size and/or use of iterative reconstruction technique. COMPARISON:  MRI brain 07/05/2019 FINDINGS: Brain: The ventricles, cisterns and other CSF spaces are normal. There is no mass, mass effect, shift of midline structures or acute hemorrhage. Subtle hazy white matter low-attenuation over the inferior right occipital lobe adjacent the posterior aspect of the right lateral ventricle likely due to chronic ischemic microvascular disease. Vascular: No hyperdense vessel or unexpected calcification. Skull: Normal. Negative for fracture or focal lesion.  Sinuses/Orbits: Orbits are normal. Mild focal mucosal membrane thickening of the left maxillary sinus compatible chronic inflammatory change. Other: None. IMPRESSION: 1. No acute findings. 2. Subtle hazy white matter low-attenuation over the inferior right occipital lobe adjacent the posterior aspect of the right lateral ventricle likely due to chronic ischemic microvascular disease. Consider MRI for further evaluation. Electronically Signed   By: Toribio Agreste M.D.   On: 04/09/2024 09:14   DG Chest 1 View Result Date: 04/08/2024 CLINICAL DATA:  Medical clearance for psychiatric admission. EXAM: CHEST  1 VIEW COMPARISON:  10/18/2023 FINDINGS: The heart size and mediastinal contours are within normal limits. Stable calcified granuloma in the lateral left lung. There is no evidence of pulmonary edema, consolidation, pneumothorax or pleural fluid. The visualized skeletal structures are unremarkable. IMPRESSION: No active disease. Electronically Signed   By: Marcey Moan M.D.   On: 04/08/2024 14:14    Microbiology: Results for orders placed or performed during the hospital encounter of 04/08/24  SARS Coronavirus 2 by RT PCR (hospital order, performed in [facility] hospital lab) *cepheid single result test* Anterior Nasal Swab     Status: None   Collection Time: 04/08/24  2:07 PM   Specimen: Anterior Nasal Swab  Result Value Ref Range Status   SARS Coronavirus 2 by RT PCR NEGATIVE NEGATIVE Final    Comment: Performed at [facility], [address], [place], [place] [address]  Urine Culture     Status: Abnormal   Collection Time: 04/09/24  8:18 AM   Specimen: Urine, Clean Catch  Result Value Ref Range Status   Specimen Description URINE, CLEAN CATCH  Final   Special Requests   Final    NONE Performed at [facility], [address] [place], [place] [address]    Culture >=100,000 COLONIES/mL ESCHERICHIA COLI (A)  Final   Report Status 04/11/2024 FINAL  Final   Organism ID,  Bacteria ESCHERICHIA COLI (A)  Final      Susceptibility   Escherichia coli - MIC*    AMPICILLIN 8 SENSITIVE Sensitive     CEFAZOLIN  (URINE) Value in next row Sensitive      <=1 SENSITIVEThis is a modified FDA-approved test that has been validated and its performance characteristics determined by the reporting laboratory.  This laboratory is certified under the Clinical Laboratory Improvement Amendments CLIA as qualified to perform high complexity clinical laboratory testing.    CEFEPIME  Value in next row Sensitive      <=1 SENSITIVEThis is a modified FDA-approved test that has been validated and its performance characteristics determined by the reporting laboratory.  This laboratory is certified under the Clinical Laboratory Improvement Amendments CLIA as qualified to perform high complexity clinical laboratory testing.    ERTAPENEM Value in next row Sensitive      <=  1 SENSITIVEThis is a modified FDA-approved test that has been validated and its performance characteristics determined by the reporting laboratory.  This laboratory is certified under the Clinical Laboratory Improvement Amendments CLIA as qualified to perform high complexity clinical laboratory testing.    CEFTRIAXONE  Value in next row Sensitive      <=1 SENSITIVEThis is a modified FDA-approved test that has been validated and its performance characteristics determined by the reporting laboratory.  This laboratory is certified under the Clinical Laboratory Improvement Amendments CLIA as qualified to perform high complexity clinical laboratory testing.    CIPROFLOXACIN Value in next row Sensitive      <=1 SENSITIVEThis is a modified FDA-approved test that has been validated and its performance characteristics determined by the reporting laboratory.  This laboratory is certified under the Clinical Laboratory Improvement Amendments CLIA as qualified to perform high complexity clinical laboratory testing.    GENTAMICIN Value in next row  Sensitive      <=1 SENSITIVEThis is a modified FDA-approved test that has been validated and its performance characteristics determined by the reporting laboratory.  This laboratory is certified under the Clinical Laboratory Improvement Amendments CLIA as qualified to perform high complexity clinical laboratory testing.    NITROFURANTOIN Value in next row Sensitive      <=1 SENSITIVEThis is a modified FDA-approved test that has been validated and its performance characteristics determined by the reporting laboratory.  This laboratory is certified under the Clinical Laboratory Improvement Amendments CLIA as qualified to perform high complexity clinical laboratory testing.    TRIMETH/SULFA Value in next row Sensitive      <=1 SENSITIVEThis is a modified FDA-approved test that has been validated and its performance characteristics determined by the reporting laboratory.  This laboratory is certified under the Clinical Laboratory Improvement Amendments CLIA as qualified to perform high complexity clinical laboratory testing.    AMPICILLIN/SULBACTAM Value in next row Sensitive      <=1 SENSITIVEThis is a modified FDA-approved test that has been validated and its performance characteristics determined by the reporting laboratory.  This laboratory is certified under the Clinical Laboratory Improvement Amendments CLIA as qualified to perform high complexity clinical laboratory testing.    PIP/TAZO Value in next row Sensitive      <=4 SENSITIVEThis is a modified FDA-approved test that has been validated and its performance characteristics determined by the reporting laboratory.  This laboratory is certified under the Clinical Laboratory Improvement Amendments CLIA as qualified to perform high complexity clinical laboratory testing.    MEROPENEM Value in next row Sensitive      <=4 SENSITIVEThis is a modified FDA-approved test that has been validated and its performance characteristics determined by the reporting  laboratory.  This laboratory is certified under the Clinical Laboratory Improvement Amendments CLIA as qualified to perform high complexity clinical laboratory testing.    * >=100,000 COLONIES/mL ESCHERICHIA COLI    Labs: CBC: Recent Labs  Lab 04/08/24 1358 04/10/24 0817 04/11/24 0410  WBC 3.7* 4.3 5.3  NEUTROABS 1.8  --   --   HGB 10.3* 10.1* 10.2*  HCT 29.8* 29.2* 29.6*  MCV 94.0 94.2 94.0  PLT 154 145* 135*   Basic Metabolic Panel: Recent Labs  Lab 04/08/24 1358 04/10/24 0817 04/11/24 0410  NA 141 139 136  K 3.0* 3.4* 3.6  CL 108 107 104  CO2 22 19* 21*  GLUCOSE 78 119* 84  BUN 11 13 15   CREATININE 1.33* 1.37* 1.38*  CALCIUM  9.4 9.2 9.1   Liver Function Tests:  Recent Labs  Lab 04/08/24 1358  AST 29  ALT 16  ALKPHOS 79  BILITOT 1.2  PROT 6.2*  ALBUMIN  3.5   CBG: No results for input(s): GLUCAP in the last 168 hours.  Discharge time spent: 35 minutes.  Length of inpatient stay: 2 days  Signed: Carliss LELON Canales, DO [facility] 04/11/2024

## 2024-04-11 NOTE — Plan of Care (Signed)
 Pt calm and cooperative. Slept well. Expresses desire to go home. No SI or HI thoughts. Pt would like her phone.  Did give her a pt phone and she called her son and husband. Phone removed per policy. Pt sleeping    Problem: Education: Goal: Knowledge of General Education information will improve Description: Including pain rating scale, medication(s)/side effects and non-pharmacologic comfort measures Outcome: Progressing   Problem: Health Behavior/Discharge Planning: Goal: Ability to manage health-related needs will improve Outcome: Progressing   Problem: Clinical Measurements: Goal: Ability to maintain clinical measurements within normal limits will improve Outcome: Progressing Goal: Will remain free from infection Outcome: Progressing Goal: Diagnostic test results will improve Outcome: Progressing Goal: Respiratory complications will improve Outcome: Progressing Goal: Cardiovascular complication will be avoided Outcome: Progressing   Problem: Nutrition: Goal: Adequate nutrition will be maintained Outcome: Progressing   Problem: Activity: Goal: Risk for activity intolerance will decrease Outcome: Progressing   Problem: Coping: Goal: Level of anxiety will decrease Outcome: Progressing   Problem: Elimination: Goal: Will not experience complications related to bowel motility Outcome: Progressing Goal: Will not experience complications related to urinary retention Outcome: Progressing   Problem: Pain Managment: Goal: General experience of comfort will improve and/or be controlled Outcome: Progressing   Problem: Safety: Goal: Ability to remain free from injury will improve Outcome: Progressing   Problem: Skin Integrity: Goal: Risk for impaired skin integrity will decrease Outcome: Progressing

## 2024-04-12 ENCOUNTER — Inpatient Hospital Stay: Attending: Hematology

## 2024-04-12 DIAGNOSIS — F22 Delusional disorders: Secondary | ICD-10-CM | POA: Diagnosis not present

## 2024-04-12 DIAGNOSIS — R7989 Other specified abnormal findings of blood chemistry: Secondary | ICD-10-CM | POA: Insufficient documentation

## 2024-04-12 DIAGNOSIS — Z87440 Personal history of urinary (tract) infections: Secondary | ICD-10-CM | POA: Diagnosis not present

## 2024-04-12 DIAGNOSIS — R300 Dysuria: Secondary | ICD-10-CM | POA: Diagnosis not present

## 2024-04-12 DIAGNOSIS — S8992XA Unspecified injury of left lower leg, initial encounter: Secondary | ICD-10-CM | POA: Insufficient documentation

## 2024-04-12 DIAGNOSIS — I2699 Other pulmonary embolism without acute cor pulmonale: Secondary | ICD-10-CM | POA: Diagnosis not present

## 2024-04-12 DIAGNOSIS — K5090 Crohn's disease, unspecified, without complications: Secondary | ICD-10-CM | POA: Diagnosis not present

## 2024-04-12 DIAGNOSIS — D508 Other iron deficiency anemias: Secondary | ICD-10-CM | POA: Diagnosis not present

## 2024-04-12 DIAGNOSIS — R531 Weakness: Secondary | ICD-10-CM | POA: Insufficient documentation

## 2024-04-12 DIAGNOSIS — R634 Abnormal weight loss: Secondary | ICD-10-CM | POA: Diagnosis not present

## 2024-04-12 DIAGNOSIS — N189 Chronic kidney disease, unspecified: Secondary | ICD-10-CM | POA: Insufficient documentation

## 2024-04-12 DIAGNOSIS — R5383 Other fatigue: Secondary | ICD-10-CM | POA: Diagnosis not present

## 2024-04-12 DIAGNOSIS — D509 Iron deficiency anemia, unspecified: Secondary | ICD-10-CM

## 2024-04-12 DIAGNOSIS — E860 Dehydration: Secondary | ICD-10-CM | POA: Insufficient documentation

## 2024-04-12 DIAGNOSIS — Z86711 Personal history of pulmonary embolism: Secondary | ICD-10-CM | POA: Insufficient documentation

## 2024-04-12 DIAGNOSIS — G4700 Insomnia, unspecified: Secondary | ICD-10-CM | POA: Diagnosis not present

## 2024-04-12 DIAGNOSIS — N3000 Acute cystitis without hematuria: Secondary | ICD-10-CM | POA: Diagnosis not present

## 2024-04-12 DIAGNOSIS — K7460 Unspecified cirrhosis of liver: Secondary | ICD-10-CM | POA: Insufficient documentation

## 2024-04-12 DIAGNOSIS — D649 Anemia, unspecified: Secondary | ICD-10-CM

## 2024-04-12 DIAGNOSIS — Z79899 Other long term (current) drug therapy: Secondary | ICD-10-CM | POA: Insufficient documentation

## 2024-04-12 LAB — IRON AND TIBC
Iron: 44 ug/dL (ref 28–170)
Saturation Ratios: 19 % (ref 10.4–31.8)
TIBC: 232 ug/dL — ABNORMAL LOW (ref 250–450)
UIBC: 189 ug/dL

## 2024-04-12 LAB — CBC WITH DIFFERENTIAL/PLATELET
Abs Immature Granulocytes: 0.03 K/uL (ref 0.00–0.07)
Basophils Absolute: 0 K/uL (ref 0.0–0.1)
Basophils Relative: 0 %
Eosinophils Absolute: 0.2 K/uL (ref 0.0–0.5)
Eosinophils Relative: 5 %
HCT: 31.5 % — ABNORMAL LOW (ref 36.0–46.0)
Hemoglobin: 11 g/dL — ABNORMAL LOW (ref 12.0–15.0)
Immature Granulocytes: 1 %
Lymphocytes Relative: 43 %
Lymphs Abs: 2 K/uL (ref 0.7–4.0)
MCH: 33 pg (ref 26.0–34.0)
MCHC: 34.9 g/dL (ref 30.0–36.0)
MCV: 94.6 fL (ref 80.0–100.0)
Monocytes Absolute: 0.4 K/uL (ref 0.1–1.0)
Monocytes Relative: 8 %
Neutro Abs: 2 K/uL (ref 1.7–7.7)
Neutrophils Relative %: 43 %
Platelets: 160 K/uL (ref 150–400)
RBC: 3.33 MIL/uL — ABNORMAL LOW (ref 3.87–5.11)
RDW: 12.7 % (ref 11.5–15.5)
WBC: 4.6 K/uL (ref 4.0–10.5)
nRBC: 0 % (ref 0.0–0.2)

## 2024-04-12 LAB — COMPREHENSIVE METABOLIC PANEL WITH GFR
ALT: 16 U/L (ref 0–44)
AST: 29 U/L (ref 15–41)
Albumin: 4 g/dL (ref 3.5–5.0)
Alkaline Phosphatase: 102 U/L (ref 38–126)
Anion gap: 13 (ref 5–15)
BUN: 14 mg/dL (ref 8–23)
CO2: 22 mmol/L (ref 22–32)
Calcium: 9.6 mg/dL (ref 8.9–10.3)
Chloride: 105 mmol/L (ref 98–111)
Creatinine, Ser: 1.43 mg/dL — ABNORMAL HIGH (ref 0.44–1.00)
GFR, Estimated: 39 mL/min — ABNORMAL LOW (ref >60)
Glucose, Bld: 80 mg/dL (ref 70–99)
Potassium: 3.7 mmol/L (ref 3.5–5.1)
Sodium: 139 mmol/L (ref 135–145)
Total Bilirubin: 0.4 mg/dL (ref 0.0–1.2)
Total Protein: 6.6 g/dL (ref 6.5–8.1)

## 2024-04-12 LAB — VITAMIN B12: Vitamin B-12: 2305 pg/mL — ABNORMAL HIGH (ref 180–914)

## 2024-04-12 LAB — FERRITIN: Ferritin: 573 ng/mL — ABNORMAL HIGH (ref 11–307)

## 2024-04-14 ENCOUNTER — Other Ambulatory Visit: Payer: Self-pay | Admitting: *Deleted

## 2024-04-14 ENCOUNTER — Encounter: Payer: Self-pay | Admitting: *Deleted

## 2024-04-14 DIAGNOSIS — I10 Essential (primary) hypertension: Secondary | ICD-10-CM | POA: Diagnosis not present

## 2024-04-14 DIAGNOSIS — F331 Major depressive disorder, recurrent, moderate: Secondary | ICD-10-CM | POA: Diagnosis not present

## 2024-04-14 DIAGNOSIS — N390 Urinary tract infection, site not specified: Secondary | ICD-10-CM | POA: Diagnosis not present

## 2024-04-14 DIAGNOSIS — Z299 Encounter for prophylactic measures, unspecified: Secondary | ICD-10-CM | POA: Diagnosis not present

## 2024-04-14 DIAGNOSIS — M25562 Pain in left knee: Secondary | ICD-10-CM | POA: Diagnosis not present

## 2024-04-14 DIAGNOSIS — K7460 Unspecified cirrhosis of liver: Secondary | ICD-10-CM

## 2024-04-14 DIAGNOSIS — K5090 Crohn's disease, unspecified, without complications: Secondary | ICD-10-CM | POA: Diagnosis not present

## 2024-04-14 DIAGNOSIS — Z09 Encounter for follow-up examination after completed treatment for conditions other than malignant neoplasm: Secondary | ICD-10-CM | POA: Diagnosis not present

## 2024-04-16 ENCOUNTER — Inpatient Hospital Stay

## 2024-04-16 ENCOUNTER — Inpatient Hospital Stay (HOSPITAL_BASED_OUTPATIENT_CLINIC_OR_DEPARTMENT_OTHER): Admitting: Oncology

## 2024-04-16 VITALS — BP 105/69 | HR 76 | Temp 98.0°F | Resp 18 | Wt 133.5 lb

## 2024-04-16 DIAGNOSIS — N189 Chronic kidney disease, unspecified: Secondary | ICD-10-CM

## 2024-04-16 DIAGNOSIS — I2699 Other pulmonary embolism without acute cor pulmonale: Secondary | ICD-10-CM

## 2024-04-16 DIAGNOSIS — D500 Iron deficiency anemia secondary to blood loss (chronic): Secondary | ICD-10-CM | POA: Diagnosis not present

## 2024-04-16 DIAGNOSIS — D508 Other iron deficiency anemias: Secondary | ICD-10-CM | POA: Diagnosis not present

## 2024-04-16 DIAGNOSIS — E860 Dehydration: Secondary | ICD-10-CM | POA: Diagnosis not present

## 2024-04-16 DIAGNOSIS — N3000 Acute cystitis without hematuria: Secondary | ICD-10-CM | POA: Diagnosis not present

## 2024-04-16 DIAGNOSIS — R7989 Other specified abnormal findings of blood chemistry: Secondary | ICD-10-CM | POA: Diagnosis not present

## 2024-04-16 HISTORY — DX: Chronic kidney disease, unspecified: N18.9

## 2024-04-16 NOTE — Assessment & Plan Note (Addendum)
-  She is followed by Dr. Rachele.   -Creatinine has been elevated since January 2018 ranging from normal to 2.09. -She received 2 doses of Retacrit  10,000 units on 11/20/23 and 12/04/2023. -Hemoglobin is currently at 11.  She does not need Retacrit  at this time but recommend returning monthly for labs and possible Retacrit .

## 2024-04-16 NOTE — Assessment & Plan Note (Addendum)
-  Diagnosed with PE in January 2025. -Hypercoagulable workup from February 2025 was essentially unremarkable. -She will likely be on anticoagulation long-term given inflammation secondary to Crohn's disease. -Denies any problems with Eliquis  at this time.

## 2024-04-16 NOTE — Assessment & Plan Note (Addendum)
-  Receives intermittent Monoferric  for IDA last given on 07/23/2023. -Labs from 04/12/2024 show hemoglobin of 11.0, unremarkable differential, ferritin 573, iron saturation 19%, elevated B12 2305 and CKD creatinine 1.43. -Reports she is taking iron supplements daily. -Denies any bleeding per rectum.  -Hemoglobin dropped during recent hospital admission due to IV fluids secondary to UTI and dehydration.  It looks like it is starting to trend up. -We discussed holding off on Retacrit  given hemoglobin is greater than 11 but would recommend 1 dose of Monoferric  given iron saturations are less than 30%. -Return to clinic in 12 weeks with lab work and see NP.   -Continue iron supplements.

## 2024-04-16 NOTE — Progress Notes (Signed)
 HGB 11.0. NO injection / Retacrit  given today.

## 2024-04-16 NOTE — Progress Notes (Signed)
 [name] OFFICE PROGRESS NOTE  Jessica Leta NOVAK, MD  ASSESSMENT & PLAN:    Assessment & Plan Iron deficiency anemia due to chronic blood loss -Receives intermittent Monoferric  for IDA last given on 07/23/2023. -Labs from 04/12/2024 show hemoglobin of 11.0, unremarkable differential, ferritin 573, iron saturation 19%, elevated B12 2305 and CKD creatinine 1.43. -Reports she is taking iron supplements daily. -Denies any bleeding per rectum.  -Hemoglobin dropped during recent hospital admission due to IV fluids secondary to UTI and dehydration.  It looks like it is starting to trend up. -We discussed holding off on Retacrit  given hemoglobin is greater than 11 but would recommend 1 dose of Monoferric  given iron saturations are less than 30%. -Return to clinic in 12 weeks with lab work and see NP.   -Continue iron supplements. Other acute pulmonary embolism without acute cor pulmonale (HCC) -Diagnosed with PE in January 2025. -Hypercoagulable workup from February 2025 was essentially unremarkable. -She will likely be on anticoagulation long-term given inflammation secondary to Crohn's disease. -Denies any problems with Eliquis  at this time. Chronic kidney disease, unspecified CKD stage -She is followed by Dr. Rachele.   -Creatinine has been elevated since January 2018 ranging from normal to 2.09. -She received 2 doses of Retacrit  10,000 units on 11/20/23 and 12/04/2023. -Hemoglobin is currently at 11.  She does not need Retacrit  at this time but recommend returning monthly for labs and possible Retacrit .  Acute cystitis without hematuria She is still currently on antibiotics.  Reports she did not have symptoms.  States she would like to have her urine rechecked at her next visit if possible.  Orders Placed This Encounter  Procedures   CBC with Differential/Platelet    Standing Status:   Future    Expected Date:   07/10/2024    Expiration Date:   04/17/2025    Release to  patient:   Immediate   Comprehensive metabolic panel    Standing Status:   Future    Expected Date:   07/10/2024    Expiration Date:   04/17/2025    Release to patient:   Immediate   Iron and TIBC    Standing Status:   Future    Expected Date:   07/10/2024    Expiration Date:   04/17/2025    Release to patient:   Immediate   Ferritin    Standing Status:   Future    Expected Date:   07/10/2024    Expiration Date:   04/17/2025    Release to patient:   Immediate   Vitamin B12    Standing Status:   Future    Expected Date:   07/10/2024    Expiration Date:   04/17/2025    Release to patient:   Immediate    INTERVAL HISTORY: Jessica [name] is a 73 year old female who is followed by us  for weakly provoked pulmonary embolus after traveling 3 hours from [place]  to [place] followed by a 2-hour flight from [place] to [place]  on 07/03/2023.   Since her last visit, she was involuntarily committed for suicidal ideation, paranoid delusions and impulsive behavior (04/09/24-04/11/24).  She was also found to have a UTI.  Reports she was recently diagnosed with cirrhosis.  States she has had a pretty rough year.  While she is in the hospital, she received IV antibiotics and IV fluids.  Hemoglobin trended down but has started to trend back up.  No blood loss.   She received 2 doses of Retacrit  10,000  units on 11/20/2023 and 12/04/2023.  She did not receive Retacrit  on 12/20/2023 due to hemoglobin of 11.4.   Reports low appetite and low energy levels.  She has 6 out of 10 pain in her left knee.  She currently uses a cane to get around.  States one of her friends large dogs ran into her and injured her left knee after she has had a partial knee replacement.  Reports she just finished antibiotics for her UTI and symptoms have resolved.  Reports she did not even know if she had a UTI and would like to have her urine checked again.  Reports chronic stable insomnia.  We reviewed CBC, CMP, ferritin, iron panel,  B12  SUMMARY OF HEMATOLOGIC HISTORY: Oncology History   No history exists.     CBC    Component Value Date/Time   WBC 4.6 04/12/2024 1353   RBC 3.33 (L) 04/12/2024 1353   HGB 11.0 (L) 04/12/2024 1353   HGB 10.8 (L) 01/28/2023 1153   HCT 31.5 (L) 04/12/2024 1353   HCT 32.7 (L) 01/28/2023 1153   PLT 160 04/12/2024 1353   PLT 138 (L) 01/28/2023 1153   MCV 94.6 04/12/2024 1353   MCV 98 (H) 01/28/2023 1153   MCH 33.0 04/12/2024 1353   MCHC 34.9 04/12/2024 1353   RDW 12.7 04/12/2024 1353   RDW 12.2 01/28/2023 1153   LYMPHSABS 2.0 04/12/2024 1353   LYMPHSABS 1.3 01/28/2023 1153   MONOABS 0.4 04/12/2024 1353   EOSABS 0.2 04/12/2024 1353   EOSABS 0.3 01/28/2023 1153   BASOSABS 0.0 04/12/2024 1353   BASOSABS 0.0 01/28/2023 1153       Latest Ref Rng & Units 04/12/2024    1:53 PM 04/11/2024    4:10 AM 04/10/2024    8:17 AM  CMP  Glucose 70 - 99 mg/dL 80  84  880   BUN 8 - 23 mg/dL 14  15  13    Creatinine 0.44 - 1.00 mg/dL 8.56  8.61  8.62   Sodium 135 - 145 mmol/L 139  136  139   Potassium 3.5 - 5.1 mmol/L 3.7  3.6  3.4   Chloride 98 - 111 mmol/L 105  104  107   CO2 22 - 32 mmol/L 22  21  19    Calcium  8.9 - 10.3 mg/dL 9.6  9.1  9.2   Total Protein 6.5 - 8.1 g/dL 6.6     Total Bilirubin 0.0 - 1.2 mg/dL 0.4     Alkaline Phos 38 - 126 U/L 102     AST 15 - 41 U/L 29     ALT 0 - 44 U/L 16        Lab Results  Component Value Date   FERRITIN 573 (H) 04/12/2024   VITAMINB12 2,305 (H) 04/12/2024    Vitals:   04/16/24 0933  BP: 105/69  Pulse: 76  Resp: 18  Temp: 98 F (36.7 C)  SpO2: 100%    Review of System:  Review of Systems  Constitutional:  Positive for malaise/fatigue and weight loss.  Genitourinary:  Positive for dysuria and frequency.  Neurological:  Positive for weakness.  Psychiatric/Behavioral:  The patient has insomnia.     Physical Exam: Physical Exam Constitutional:      Appearance: Normal appearance.  HENT:     Head: Normocephalic and  atraumatic.  Eyes:     Pupils: Pupils are equal, round, and reactive to light.  Cardiovascular:     Rate and Rhythm: Normal rate and  regular rhythm.     Heart sounds: Normal heart sounds. No murmur heard. Pulmonary:     Effort: Pulmonary effort is normal.     Breath sounds: Normal breath sounds. No wheezing.  Abdominal:     General: Bowel sounds are normal. There is no distension.     Palpations: Abdomen is soft.     Tenderness: There is no abdominal tenderness.  Musculoskeletal:        General: Normal range of motion.     Cervical back: Normal range of motion.  Skin:    General: Skin is warm and dry.     Coloration: Skin is pale.     Findings: No rash.  Neurological:     Mental Status: She is alert and oriented to person, place, and time.     Gait: Gait is intact.  Psychiatric:        Mood and Affect: Mood and affect normal.        Cognition and Memory: Memory normal.        Judgment: Judgment normal.      I spent 25 minutes dedicated to the care of this patient (face-to-face and non-face-to-face) on the date of the encounter to include what is described in the assessment and plan.,  Delon Hope, NP 04/17/2024 8:14 AM

## 2024-04-17 DIAGNOSIS — N390 Urinary tract infection, site not specified: Secondary | ICD-10-CM

## 2024-04-17 HISTORY — DX: Urinary tract infection, site not specified: N39.0

## 2024-04-17 NOTE — Assessment & Plan Note (Addendum)
 She is still currently on antibiotics.  Reports she did not have symptoms.  States she would like to have her urine rechecked at her next visit if possible.

## 2024-04-18 ENCOUNTER — Other Ambulatory Visit: Payer: Self-pay

## 2024-04-18 ENCOUNTER — Encounter (HOSPITAL_COMMUNITY): Payer: Self-pay

## 2024-04-18 ENCOUNTER — Emergency Department (HOSPITAL_COMMUNITY)
Admission: EM | Admit: 2024-04-18 | Discharge: 2024-04-18 | Disposition: A | Discharge status: Home / Self Care | Attending: Emergency Medicine | Admitting: Emergency Medicine

## 2024-04-18 DIAGNOSIS — N390 Urinary tract infection, site not specified: Secondary | ICD-10-CM | POA: Diagnosis not present

## 2024-04-18 DIAGNOSIS — M47817 Spondylosis without myelopathy or radiculopathy, lumbosacral region: Secondary | ICD-10-CM | POA: Insufficient documentation

## 2024-04-18 DIAGNOSIS — Z7901 Long term (current) use of anticoagulants: Secondary | ICD-10-CM | POA: Insufficient documentation

## 2024-04-18 DIAGNOSIS — Z711 Person with feared health complaint in whom no diagnosis is made: Secondary | ICD-10-CM | POA: Insufficient documentation

## 2024-04-18 DIAGNOSIS — G603 Idiopathic progressive neuropathy: Secondary | ICD-10-CM | POA: Insufficient documentation

## 2024-04-18 DIAGNOSIS — G894 Chronic pain syndrome: Secondary | ICD-10-CM | POA: Insufficient documentation

## 2024-04-18 DIAGNOSIS — M5412 Radiculopathy, cervical region: Secondary | ICD-10-CM | POA: Insufficient documentation

## 2024-04-18 DIAGNOSIS — G5603 Carpal tunnel syndrome, bilateral upper limbs: Secondary | ICD-10-CM | POA: Insufficient documentation

## 2024-04-18 DIAGNOSIS — M47812 Spondylosis without myelopathy or radiculopathy, cervical region: Secondary | ICD-10-CM | POA: Insufficient documentation

## 2024-04-18 NOTE — ED Triage Notes (Signed)
 Pt presents to ED with IVC paperwork from [facility], nothing in the paperwork indicates that pt is a harm to herself or anyone else. Pt is currently staying in a hotel in [place] and going through a divorce with her husband who is residing in the house. Pt reports pulling off the road to talk on her cell phone. Pt is alert and oriented x 4. Pt is calm and cooperative. Pt says she had called sheriff's dept to have someone escort her to the house and get her medications, when she got to sheriff's dept, IVC paperwork taken out by her husband was served.

## 2024-04-18 NOTE — Discharge Instructions (Signed)
 Please return for any severe or worsening symptoms, if you need a psychiatry follow-up see the phone number attached

## 2024-04-18 NOTE — ED Notes (Signed)
 Officer is going to give patient a ride back to her car.

## 2024-04-18 NOTE — ED Provider Notes (Signed)
 [place] EMERGENCY DEPARTMENT AT [facility] Provider Note   CSN: [id number] Arrival date & time: 04/18/24  1925     Patient presents with: IVC   Jessica [name] is a 73 y.o. female.   HPI   This patient is a 73 year old female, she has a history of Crohn's disease, anxiety, history of recently being admitted to the hospital for urinary tract infection, at the same time she was actually under involuntary commitment and was noted to be slightly delusional and having some mild hallucinations.  Over the course of her inpatient stay she was treated for the UTI her symptoms completely resolved and she was discharged home.  She was discharged on olanzapine, the patient states that the root of the IVC came from the discord that she has had with her husband, she has told him that she is going to leave him and filed for divorce because of the way that he is making her feel at home, he has made some disparaging remarks to her according to her report.  She states that she told them that she was filing for divorce, he promptly filled out IVC paperwork stating that the patient was not in her right state of mind and filed it with the magistrate.  She comes in the custody of the [facility] department, they state that she has been very pleasant, there is no signs of internal stimuli or hallucinations, the patient is able to give me an extremely clear history of everything that has been going on, she does not appear to be in any distress whatsoever and has no thoughts of self-harm or helping others.  She has in very good condition and appears to be very well-groomed  Prior to Admission medications   Medication Sig Start Date End Date Taking? Authorizing Provider  acetaminophen  (TYLENOL ) 325 MG tablet Take 2 tablets (650 mg total) by mouth every 6 (six) hours as needed for mild pain (pain score 1-3), fever or headache (or Fever >/= 101). 07/16/23   Pearlean Manus, MD  Albuterol -Budesonide (AIRSUPRA )  90-80 MCG/ACT AERO Inhale 2 puffs into the lungs every 8 (eight) hours. 07/22/23   Jude Harden GAILS, MD  apixaban  (ELIQUIS ) 5 MG TABS tablet Take 1 tablet (5 mg total) by mouth 2 (two) times daily. 08/13/23   Pearlean Manus, MD  Azelastine HCl 137 MCG/SPRAY SOLN Place 2 sprays into both nostrils 2 (two) times daily as needed (Congestion/ Allergies). 07/17/23   [provider]  baclofen (LIORESAL) 10 MG tablet Take 10 mg by mouth 2 (two) times daily as needed. 12/09/23   [provider]  buPROPion  (WELLBUTRIN  XL) 300 MG 24 hr tablet Take 1 tablet (300 mg total) by mouth every morning. 04/11/24   Arlon Carliss ORN, DO  calcitRIOL (ROCALTROL) 0.25 MCG capsule Take 0.25 mcg by mouth daily. 10/26/23   [provider]  cyanocobalamin  (VITAMIN B12) 1000 MCG/ML injection Inject 1,000 mcg into the muscle once a week.    [provider]  dicyclomine  (BENTYL ) 10 MG capsule Take 1 capsule (10 mg total) by mouth 2 (two) times daily as needed for spasms. 02/26/24   Kennedy Charmaine CROME, NP  escitalopram  (LEXAPRO ) 10 MG tablet Take 1 tablet (10 mg total) by mouth daily. 04/11/24   Arlon Carliss ORN, DO  ferrous sulfate  325 (65 FE) MG EC tablet Take 1 tablet (325 mg total) by mouth daily with breakfast. 03/01/24   Geofm Delon BRAVO, NP  HYDROcodone -acetaminophen  (NORCO) 7.5-325 MG tablet 1 tablet Orally  four times a day; Duration: 30 days As needed 04/06/24   [provider]  levothyroxine  (SYNTHROID ) 100 MCG tablet Take 100 mcg by mouth daily. 01/09/22   [provider]  loperamide  (IMODIUM ) 2 MG capsule Take 1 capsule (2 mg total) by mouth as needed for diarrhea or loose stools. 02/26/24   Kennedy Charmaine CROME, NP  mesalamine  (APRISO ) 0.375 g 24 hr capsule Take 4 capsules (1.5 g total) by mouth daily. 03/05/24   Eartha Angelia Sieving, MD  metoprolol  succinate (TOPROL -XL) 25 MG 24 hr tablet Take 25 mg by mouth daily. 01/13/24   [provider]  Multiple Vitamin (MULTIVITAMIN  WITH MINERALS) TABS tablet Take 1 tablet by mouth daily.    [provider]  OLANZapine (ZYPREXA) 2.5 MG tablet Take 1 tablet (2.5 mg total) by mouth daily. 04/11/24   Arlon Carliss ORN, DO  ondansetron  (ZOFRAN -ODT) 4 MG disintegrating tablet Take 1 tablet (4 mg total) by mouth every 8 (eight) hours as needed. 02/26/24   Kennedy Charmaine CROME, NP  pantoprazole  (PROTONIX ) 40 MG tablet Take 1 tablet (40 mg total) by mouth daily. 02/26/24   Kennedy Charmaine CROME, NP  Risankizumab -rzaa (SKYRIZI ) 180 MG/1.2ML SOCT Inject 180 mg into the skin every 8 (eight) weeks. Starting at week 12 on the week of 9/30 04/06/24   Kennedy Charmaine CROME, NP  rOPINIRole  (REQUIP ) 1 MG tablet Take 3 mg by mouth at bedtime. 03/18/23   [provider]  rosuvastatin  (CRESTOR ) 10 MG tablet Take 10 mg by mouth daily. 02/23/24   [provider]  sodium bicarbonate 650 MG tablet Take 650 mg by mouth 2 (two) times daily.    [provider]  thiamine (VITAMIN B-1) 100 MG tablet Take 100 mg by mouth daily.    [provider]  tiZANidine (ZANAFLEX) 4 MG tablet 1 tablet at bedtime as needed Orally; Duration: 30 days  06/05/24  [provider]    Allergies: Entyvio  [vedolizumab ], Robaxin  [methocarbamol ], and Sulfa antibiotics    Review of Systems  All other systems reviewed and are negative.   Updated Vital Signs BP (!) 166/93 (BP Location: Left Arm)   Pulse 97   Temp 98.3 F (36.8 C) (Oral)   Resp 18   Ht 1.575 m (5' 2)   Wt 60.6 kg   SpO2 100%   BMI 24.42 kg/m   Physical Exam Vitals and nursing note reviewed.  Constitutional:      General: She is not in acute distress.    Appearance: She is well-developed.  HENT:     Head: Normocephalic and atraumatic.     Mouth/Throat:     Pharynx: No oropharyngeal exudate.  Eyes:     General: No scleral icterus.       Right eye: No discharge.        Left eye: No discharge.     Conjunctiva/sclera: Conjunctivae normal.     Pupils: Pupils  are equal, round, and reactive to light.  Neck:     Thyroid : No thyromegaly.     Vascular: No JVD.  Cardiovascular:     Rate and Rhythm: Normal rate and regular rhythm.     Heart sounds: Normal heart sounds. No murmur heard.    No friction rub. No gallop.  Pulmonary:     Effort: Pulmonary effort is normal. No respiratory distress.     Breath sounds: Normal breath sounds. No wheezing or rales.  Abdominal:     General: Bowel sounds are normal. There is no  distension.     Palpations: Abdomen is soft. There is no mass.     Tenderness: There is no abdominal tenderness.  Musculoskeletal:        General: No tenderness. Normal range of motion.     Cervical back: Normal range of motion and neck supple.     Right lower leg: No edema.     Left lower leg: No edema.  Lymphadenopathy:     Cervical: No cervical adenopathy.  Skin:    General: Skin is warm and dry.     Findings: No erythema or rash.  Neurological:     Mental Status: She is alert.     Coordination: Coordination normal.  Psychiatric:        Behavior: Behavior normal.     Comments: The patient is psychiatrically very calm, able to answer my questions clearly and with exactness, she has a good attention span and is able to answer full conversations, she has no flights of ideas no delusions no hallucinations, she is pleasant and happy and expresses no thoughts of self-harm     (all labs ordered are listed, but only abnormal results are displayed) Labs Reviewed - No data to display  EKG: None  Radiology: No results found.   Procedures   Medications Ordered in the ED - No data to display                                  Medical Decision Making  Will overturn IVC, the sheriff will take her to her house where her husband lives to get her medications, stable for discharge     Final diagnoses:  Physically well but worried    ED Discharge Orders     None          Cleotilde Rogue, MD 04/18/24 2012

## 2024-04-19 ENCOUNTER — Inpatient Hospital Stay

## 2024-04-19 DIAGNOSIS — I10 Essential (primary) hypertension: Secondary | ICD-10-CM | POA: Diagnosis not present

## 2024-04-19 DIAGNOSIS — N1831 Chronic kidney disease, stage 3a: Secondary | ICD-10-CM | POA: Diagnosis not present

## 2024-04-19 DIAGNOSIS — D333 Benign neoplasm of cranial nerves: Secondary | ICD-10-CM | POA: Diagnosis not present

## 2024-04-19 DIAGNOSIS — K5090 Crohn's disease, unspecified, without complications: Secondary | ICD-10-CM | POA: Diagnosis not present

## 2024-04-19 DIAGNOSIS — I2699 Other pulmonary embolism without acute cor pulmonale: Secondary | ICD-10-CM | POA: Diagnosis not present

## 2024-04-19 DIAGNOSIS — Z299 Encounter for prophylactic measures, unspecified: Secondary | ICD-10-CM | POA: Diagnosis not present

## 2024-04-21 ENCOUNTER — Encounter (INDEPENDENT_AMBULATORY_CARE_PROVIDER_SITE_OTHER): Payer: Self-pay | Admitting: Gastroenterology

## 2024-04-21 ENCOUNTER — Telehealth (HOSPITAL_COMMUNITY): Payer: Self-pay

## 2024-04-21 NOTE — Telephone Encounter (Signed)
 Pt's husband called in stating that pt is having hallucinations seeing and hearing things. He states that he has went to the magistrate and they took her to the hosp where she was released. He states that yesterday pt packed some clothes and left and that he does not know where she is currently. Pt is aware of [facility] in [place] and is aware that if she is missing more than 24 hrs to put out a missing persons alert for her. He just wanted to let us  know what was going on.

## 2024-04-21 NOTE — Telephone Encounter (Signed)
 Noted .  Reviewed chart and it looks like patient was evaluated and released from the emergency department.

## 2024-04-26 DIAGNOSIS — S81811A Laceration without foreign body, right lower leg, initial encounter: Secondary | ICD-10-CM | POA: Diagnosis not present

## 2024-04-26 DIAGNOSIS — Z7901 Long term (current) use of anticoagulants: Secondary | ICD-10-CM | POA: Diagnosis not present

## 2024-04-26 DIAGNOSIS — N189 Chronic kidney disease, unspecified: Secondary | ICD-10-CM | POA: Diagnosis not present

## 2024-04-26 DIAGNOSIS — Z86718 Personal history of other venous thrombosis and embolism: Secondary | ICD-10-CM | POA: Diagnosis not present

## 2024-04-26 DIAGNOSIS — S80811A Abrasion, right lower leg, initial encounter: Secondary | ICD-10-CM | POA: Diagnosis not present

## 2024-04-26 DIAGNOSIS — X58XXXA Exposure to other specified factors, initial encounter: Secondary | ICD-10-CM | POA: Diagnosis not present

## 2024-04-27 ENCOUNTER — Ambulatory Visit (HOSPITAL_COMMUNITY): Admitting: Psychiatry

## 2024-05-05 ENCOUNTER — Other Ambulatory Visit (HOSPITAL_COMMUNITY): Payer: Self-pay | Admitting: Psychiatry

## 2024-05-05 NOTE — Telephone Encounter (Signed)
 Pt was recently hospitalized and missed appt last week, please call to r/s

## 2024-05-10 ENCOUNTER — Other Ambulatory Visit (HOSPITAL_COMMUNITY): Payer: Self-pay | Admitting: Psychiatry

## 2024-05-10 DIAGNOSIS — G5690 Unspecified mononeuropathy of unspecified upper limb: Secondary | ICD-10-CM | POA: Diagnosis not present

## 2024-05-10 DIAGNOSIS — I10 Essential (primary) hypertension: Secondary | ICD-10-CM | POA: Diagnosis not present

## 2024-05-10 DIAGNOSIS — Z23 Encounter for immunization: Secondary | ICD-10-CM | POA: Diagnosis not present

## 2024-05-10 DIAGNOSIS — M25541 Pain in joints of right hand: Secondary | ICD-10-CM | POA: Diagnosis not present

## 2024-05-10 DIAGNOSIS — K5090 Crohn's disease, unspecified, without complications: Secondary | ICD-10-CM | POA: Diagnosis not present

## 2024-05-10 DIAGNOSIS — N1831 Chronic kidney disease, stage 3a: Secondary | ICD-10-CM | POA: Diagnosis not present

## 2024-05-10 DIAGNOSIS — L03011 Cellulitis of right finger: Secondary | ICD-10-CM | POA: Diagnosis not present

## 2024-05-10 DIAGNOSIS — Z299 Encounter for prophylactic measures, unspecified: Secondary | ICD-10-CM | POA: Diagnosis not present

## 2024-05-10 NOTE — Telephone Encounter (Signed)
 Call pt, she no showed last week, thanks

## 2024-05-11 ENCOUNTER — Encounter: Payer: Self-pay | Admitting: Gastroenterology

## 2024-05-11 ENCOUNTER — Ambulatory Visit (INDEPENDENT_AMBULATORY_CARE_PROVIDER_SITE_OTHER): Admitting: Gastroenterology

## 2024-05-11 VITALS — BP 138/86 | HR 81 | Temp 98.8°F | Ht 62.0 in | Wt 125.6 lb

## 2024-05-11 DIAGNOSIS — R112 Nausea with vomiting, unspecified: Secondary | ICD-10-CM

## 2024-05-11 DIAGNOSIS — K219 Gastro-esophageal reflux disease without esophagitis: Secondary | ICD-10-CM | POA: Diagnosis not present

## 2024-05-11 DIAGNOSIS — K7460 Unspecified cirrhosis of liver: Secondary | ICD-10-CM

## 2024-05-11 DIAGNOSIS — K5080 Crohn's disease of both small and large intestine without complications: Secondary | ICD-10-CM | POA: Diagnosis not present

## 2024-05-11 DIAGNOSIS — D508 Other iron deficiency anemias: Secondary | ICD-10-CM | POA: Diagnosis not present

## 2024-05-11 DIAGNOSIS — F411 Generalized anxiety disorder: Secondary | ICD-10-CM | POA: Diagnosis not present

## 2024-05-11 DIAGNOSIS — K529 Noninfective gastroenteritis and colitis, unspecified: Secondary | ICD-10-CM | POA: Diagnosis not present

## 2024-05-11 MED ORDER — DICYCLOMINE HCL 10 MG PO CAPS: 10.0000 mg | ORAL_CAPSULE | Freq: Two times a day (BID) | ORAL | 2 refills | Status: AC | PRN

## 2024-05-11 MED ORDER — LOPERAMIDE HCL 2 MG PO CAPS: 2.0000 mg | ORAL_CAPSULE | ORAL | 2 refills | Status: AC | PRN

## 2024-05-11 NOTE — Progress Notes (Addendum)
 GI Office Note    Referring Provider: [name], MD Primary Care Physician:  Jessica Leta NOVAK, MD Primary Gastroenterologist: Jessica Rubins, MD  Date:  05/11/2024  ID:  Jessica [name], DOB 1951/06/08, MRN [id number]  Chief Complaint   Chief Complaint  Patient presents with   Follow-up    Follow up. Not sure why she's here    History of Present Illness  Jessica [name] is a 73 y.o. 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   Colonoscopy 06/08/2018: - The examined portion of the ileum was normal.  - Mucosal ulceration. Biopsied.  - Colonic fistula.  - Internal hemorrhoids. -pathology: small reactive changes but no active disease -Repeat colonoscopy in 5 years.  -Patient requested to not go any therapy unless she relapses.   Office visit 01/30/21.  Doing well.  Heartburn controlled pantoprazole .  Willing to drop her dose in the future due to risk of osteopenia.  Unsure when due for next bone density.  Denied any dysphagia, hoarseness, sore throat, chronic cough.  Has occasional diarrhea and accidents but mostly she has 2 formed stools.  Stop dicyclomine .  Denied any melena or bright BPR.  Appetite good.    Last office visit 12/26/2022.  Some incontinence of urine and feces.  PCP recommended she be seen due to anemia.  Taking oral iron supplement daily.  Denied melena or BRBPR.  Stools normal though she is not having 5 to 6/day and usually moderate amount.  Taking Imodium  once daily and Bentyl  1-2 times daily.  Gets constipated sometimes with taking iron.  Due to see Dr. Rachele for her kidneys.  Still having fatigue.  Some mild dysphagia with food being stuck in her mid chest region, especially bread.  Using CPAP at night   Colonoscopy 04/01/23: -Crohn's disease with ileitis, congestion/edema, erosions, erythema  found in the neoterminal ileum Rtugeerts Score i1 -Associated narrowing of the lumen s/p biopsy -Patent end-to-side colocolonic anastomosis with healthy-appearing mucosa -Three 4-12 mm polyps in the transverse colon -Nonbleeding internal hemorrhoids -Advised to check TB and hepatitis B surface antigen in anticipation of treatment with Stelara /Entyvio /Skyrizi  -Pathology with ileal mucosa with focal erosion and active neutrophilic inflammation (NSAID versus Crohn's).  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     EGD 04/01/23: -LA grade C esophagitis with no bleeding s/p biopsy -KOH brushings obtained -4cm hiatal hernia -Normal stomach -Normal duodenum -GE junction biopsy with inflamed squamous mucosa with features consistent with severe reflux.  No yeast   Hep B surface antigen -neg. 04/01/2023.  TB Gold negative   OV 04/15/23.  Still having some fatigue related to anemia.  Had an episode of emesis after eating Mexican food, also with spicy.  She is taking her pantoprazole  twice daily.  Having some mild dysphagia with solid food still.  Having bowel movements after every meal, usually 3/day.  Taking Imodium  and dicyclomine  which helps with her abdominal pain.  Does have associated arthritis. Up-to-date on COVID-vaccine and boosters as well as flu shot.  Shingles vaccine in October 2020.   Stelara  and Skyrizi  were sent in as options for treatment of Crohn's however given large co-pay for both of these, there was discussion about Entyvio  versus Remicade, given the higher risk of malignancy with Remicade, a shared decision was made with the patient to proceed with Entyvio .   After  receiving a couple doses of Entyvio  patient developed severe joint pain therefore this was discontinued.  Since her last visit she has had 2 visits to [facility] with 1 admission.  Had an ED visit for pneumonia as well as a hospitalization secondary to pulmonary embolism.    EGD was scheduled for 07/18/2023, this  was canceled and need to ensure healing/management of PE prior to rescheduling EGD.   Seen by pulmonology 07/22/2023.  Having chronic cough, chest CT ordered.  Advised to continue Eliquis  for PE. Will follow up in 1 month.    Received Monoferric  infusion 07/23/2023.    OV 08/14/2023. Still with some complaints of fatigue as well as baseline joint pain and some muscle pain.  Denied any melena or BRBPR.  Reportedly only having 1 bowel movement daily and had not been taking Imodium , more constipated since her hospitalization.  Having to strain and not having left abdominal pain.  Reported some reflux issues at night prior but not having frequent issues.  Takes pantoprazole  daily with occasional missed doses.  Had been taking her Eliquis  and still reporting dyspnea on exertion.  Asked continue daily iron.  Discussed continuing Entyvio  versus Remicade versus resubmitting for Stelara , she was advised to let us  know what she decided to do.  Advised to start MiraLAX  for constipation and hold Imodium  unless having significant diarrhea.  Scheduled for EGD with Dr. Eartha after getting pulmonary and cardiology clearance.  Continue PPI BID.    Received message from Dr. Eartha that we should try Rinvoq  for her Crohn's.  This was ultimately submitted.  Decision was made to go forward with Remicade if unable to afford Rinvoq .    EGD 10/07/2023: - White nummular lesions in esophageal mucosa. Biopsied.  - A medium amount of food ( residue) in the stomach.  - Normal examined duodenum. - Advised to stop omeprazole  and start pantoprazole . - Change her Rinvoq  to 30 mg daily - Path: No significant diagnostic alteration of esophageal tissue   She reported significant cough after her EGD, there was concern for aspiration pneumonia therefore antibiotics sent in for her.  Given her pancytopenia with normalization of CRP and lipid panel dark as needed advised recheck CBC and CMP in a week.   CT C/A/P 10/15/23:  - Nodular  hepatic contour - Scattered calcifications consistent with sequelae of prior granulomatous disease - No focal liver lesions - Small hiatal hernia - No acute intrathoracic, intra-abdominal, or intrapelvic abnormality   Admitted to [facility] 10/18/23 after she presented with persistent nausea and vomiting.  She was seen by GI at [facility] and underwent EGD below.  She requested restart Apriso  and stop her Rinvoq  given potential side effects including her pancytopenia.  Underwent CT scan as noted below.   CT A/P without contrast 10/18/2023: - Scattered calcifications noted within the liver stable from prior exam, mild nodularity noted, stable from prior. - Evidence of cholecystectomy - Normal-appearing pancreas - Scattered granulomas within the spleen, small accessory splenule - Postsurgical changes of right colon consistent with prior resection - No evidence of small bowel obstruction, normal stomach   EGD 10/23/2023: - Acute gastritis s/p biopsy - Grade a reflux esophagitis without bleeding - Medium size hiatal hernia - Normal duodenum - Path: Reactive gastropathy with foveolar hyperplasia, negative for metaplasia, or dysplasia.  Negative H. pylori   OV 12/04/23 with Dr. Eartha.  Patient discussed that she was very fearful regarding any medications for Crohn's disease given she wants to avoid having more unwanted side  effects.  She was reportedly taking 2 Imodium  pills every other day and 1 pill every other day and had been having 1-2 bowel movements per day that were soft in nature.  Denied any melena or hematochezia.  Did endorse some abdominal pain occasionally, some nausea at times.  She reports she has lost about 20 pounds since her symptoms started.  No jaundice or pruritus.  She was advised to read about medications for Crohn's disease including Tremfya, Skyrizi , Stelara , Remicade, and Humira.  Advise continue Apriso /mesalamine  for now.  I discussed for 1 hour the importance of  treating her disease to avoid further complications.  She stated she would discuss which medication she wanted to try at her next appointment.  Plans to discuss imaging findings of cirrhosis at upcoming appointment.   OV 01/06/24. Having BM 2-3 times daily with bentyl  and imodium  once daily and take an additional dose of imodium  if needed. Had been on mesalamine  since discharge. GERD controlled. No sign of decompensation related to cirrhosis. Advised RUQ ultrasound in October.  Check labs including CBC, CMP, INR, AFP, ASMA, AMA, immunoglobulins, viral hepatitis panel given concern for cirrhosis on CT in April.  Submitted for Skyrizi  for treatment of cirrhosis, continuing dicyclomine  and Imodium  for now along with mesalamine  until induction of Skyrizi  completed.  Continue pantoprazole  40 mg daily.   Labs 01/14/2024: ASMA 49, AMA 53.7, normal IgG and IgM.  ANA negative.  INR 1.1.  Sodium 144, creatinine 1.34, albumin  4.1, alk phos 152, AST 22, ALT 16, T. bili 0.3.  Viral hepatitis panel negative.  aFP 2.1.   01/16/2024 labs:  hemoglobin 13.3, platelets 151, alk phos 115, T. bili 0.7, AST 26, ALT 20, creatinine 1.4, sodium 140, albumin  3.9.  Iron 76, saturation 28%, ferritin 342, B12 high at 1105   On 01/16/2024 I spoke with the patient in detail regarding the results of her lab work over the phone.  She had elevated alkaline phosphatase as well as positive AMA and ASMA and we discussed the possibilities of PBC, autoimmune hepatitis, and her prior concern for cirrhosis.  I had had a discussion with Dr. Eartha who also recommend a liver biopsy to further determine if she has cirrhosis and potential cause.  We discussed this in detail at that time and she was agreeable to proceed with liver biopsy.   Underwent liver biopsy on 02/16/2024: -  Cirrhosis with evidence of regression in the background of apparent  mild steatohepatitis and changes . Bridging fibrosis with areas of pericellular/perisinusoidal fibrosis   indicative of a previous steatohepatitis in addition many of these areas  of bridging fibrosis show attenuation and thinning suggestive of possible regression. The hepatic parenchyma shows minimal steatosis but only approximately 5% which shows abundant glycogenation of the nuclei as well as scattered ballooning degeneration as well as a very rare apoptotic body consistent with a mild ongoing damage.  The portal tracts have a minimal amount of chronic inflammation as well as periportal histiocytosis consistent of previous damage.  There is also a bile ductular proliferation consistent with the presence of cirrhosis.  There is no evidence of cholestasis, bile duct damage/injury or periductular fibrosis.  Granulomas are not identified.  Plasma cells are not a prominent feature of the mild chronic inflammation.  Eosinophils are also not a major component of the inflammation.  An iron stain shows mild amount of Kupffer cell iron as well as minimal [name] hepatocyte iron.  A reticulin stain shows overall normal plate thickness.  A PAS  stain is unremarkable.  A CK7 stain highlights the bile ductular proliferation with some bile duct loss of uncertain clinical significance.  Overall while there is evidence of cirrhosis, it has the slight appearance of regression.  An underlying etiology is not definitively morphologically identified; however, given the pattern of fibrosis, presence of ballooning degeneration, glycogenated nuclei and rare steatosis and underlying steatohepatitis is favored other etiologies could include drug-induced liver injury amongst others    Discussed the liver biopsy results previously with Dr. Eartha who feels as though this could be combination of Dili versus steatohepatitis however given her positive autoimmune markers he agrees with my initial plan for referral to hepatology to have a discussion on if this is overlapping disease.  Would likely not change to her management very much other than  potential addition of ursodiol although on recent labs her alkaline phosphatase has normalized.   Received Skyrizi  on 7/8 and 8/5, & 03/09/24. Should start SQ injection on 9/30 which was copmleted with [facility] nurse on the phone and they noted a little bit of leaking by the end of the injection and they asked what to do. Given she got the majority of the dose I advised them to just proceed with next scheduled dose.  Last office visit 02/26/2024.  She reported her concerns about her liver biopsy and was interested in appropriate dietary lifestyle modifications.  She had been experiencing diarrhea and abdominal cramping with diarrhea occurring 3-4 times a day with urgency.  Had been taking dicyclomine  once a day as well as Imodium  daily as needed.  She had already had 2 induction doses of Skyrizi  for Crohn's and had reported some headaches as well as some vomiting which she felt as though it was related to medication however she had had the symptoms before in the past occurring with revoke but with more frequent and usually vomiting is occurring weeks after the infusion and not immediately after.  She has taking Zofran  for nausea which was helping.  Had been taking lots of her medications on empty stomach as well which may be contributing.  She was not having any concerning complications from cirrhosis on IHD, peripheral edema, or concern for ascites.  We discussed referral to hepatology for her.  Advised that she would due for ultrasound and labs in October, provided significant time of education with the patient.  We discussed performing a surveillance colonoscopy 6 to 12 months after being on maintenance dosing of Skyrizi .  Refilled her Imodium  and dicyclomine .  Advised her to continue pantoprazole  and use Zofran  as needed for nausea.     Latest Ref Rng & Units 04/12/2024    1:53 PM 04/11/2024    4:10 AM 04/10/2024    8:17 AM  CBC  WBC 4.0 - 10.5 K/uL 4.6  5.3  4.3   Hemoglobin 12.0 - 15.0 g/dL 88.9  89.7   89.8   Hematocrit 36.0 - 46.0 % 31.5  29.6  29.2   Platelets 150 - 400 K/uL 160  135  145    Iron/TIBC/Ferritin/ %Sat    Component Value Date/Time   IRON 44 04/12/2024 1353   TIBC 232 (L) 04/12/2024 1353   FERRITIN 573 (H) 04/12/2024 1353   IRONPCTSAT 19 04/12/2024 1353    Today:  Discussed the use of AI scribe software for clinical note transcription with the patient, who gave verbal consent to proceed.  MELD 3.0: 12 at 01/16/2024  8:06 AM MELD-Na: 11 at 01/16/2024  8:06 AM Calculated from: Serum Creatinine: 1.4  mg/dL at 2/88/7974  1:93 AM Serum Sodium: 140 mmol/L (Using max of 137 mmol/L) at 01/16/2024  8:06 AM Total Bilirubin: 0.7 mg/dL (Using min of 1 mg/dL) at 2/88/7974  1:93 AM Serum Albumin : 3.9 g/dL (Using max of 3.5 g/dL) at 2/88/7974  1:93 AM INR(ratio): 1.1 at 01/14/2024  2:45 PM Age at listing (hypothetical): 69 years Sex: Female at 01/16/2024  8:06 AM   US : CT April 2025 without liver lesion. Hep A/B vaccination: EGD: April 2025 BB: metoprolol  Ascites/peripheral edema: peripheral edema to RLE Paracentesis: n/a History of SBP: n/a Encephalopathy:  n/a  She is currently on Skyrizi  for her Crohn's disease and reports no adverse reactions so far. She missed a liver appointment last month due to being out of state and has not rescheduled it yet. She is concerned about missing an iron infusion but notes that her hemoglobin levels have improved since the beginning of October.  She takes vitamin B12 shots, resulting in high B12 levels, likely due to the timing of the shot relative to lab draws. Her liver function tests have been normal, and she has had previously elevated levels in the past. She has an upcoming appointment with hepatology in December ([facility]).  She experiences abdominal pain, which has improved with the use of dicyclomine  and Imodium . She takes one Imodium  daily, increasing to two if symptoms worsen, and one dicyclomine  daily, also increasing if  needed. She averages two bowel movements per day, increasing to three or four if she eats certain foods.  She has significant weight loss, dropping from a size 17/18 to a size 10 over a few years, and currently weighs 128 pounds. She reports a lack of appetite, eating only small amounts at meals, and attributes this to stress and anxiety. She is not consuming enough calories or protein.  She experiences shortness of breath at rest and swelling in her right leg, which improves with elevation. She describes her abdominal pain as a dull ache, primarily around the belly button area, and reports occasional nausea but has not needed to use Zofran  frequently.  Her current medications include Skyrizi , dicyclomine , Imodium , Eliquis , pantoprazole , and Zofran . She is in the process of adjusting her drug plan to better cover the cost of Skyrizi .      Next scheduled dose for skyrizi  is 06/01/24.  Previous Crohn's medications: Pentasa  (not achieving endoscopic remission), Entyvio  (joint pain?, DVT/PE? Although most likely thromboemolitic in nature from travel), and Rinvoq  (N/V)  Wt Readings from Last 10 Encounters:  05/11/24 125 lb 9.6 oz (57 kg)  04/18/24 133 lb 8 oz (60.6 kg)  04/16/24 133 lb 8 oz (60.6 kg)  04/08/24 132 lb (59.9 kg)  02/26/24 138 lb (62.6 kg)  02/16/24 142 lb (64.4 kg)  02/03/24 141 lb 3.2 oz (64 kg)  01/16/24 140 lb 9.6 oz (63.8 kg)  01/06/24 143 lb 6.4 oz (65 kg)  12/17/23 142 lb 6.3 oz (64.6 kg)    Body mass index is 22.97 kg/m.   Current Outpatient Medications  Medication Sig Dispense Refill   acetaminophen  (TYLENOL ) 325 MG tablet Take 2 tablets (650 mg total) by mouth every 6 (six) hours as needed for mild pain (pain score 1-3), fever or headache (or Fever >/= 101).     Albuterol -Budesonide (AIRSUPRA ) 90-80 MCG/ACT AERO Inhale 2 puffs into the lungs every 8 (eight) hours.     apixaban  (ELIQUIS ) 5 MG TABS tablet Take 1 tablet (5 mg total) by mouth 2 (two) times daily. 60  tablet  4   Azelastine HCl 137 MCG/SPRAY SOLN Place 2 sprays into both nostrils 2 (two) times daily as needed (Congestion/ Allergies).     baclofen  (LIORESAL ) 10 MG tablet Take 10 mg by mouth 2 (two) times daily as needed.     buPROPion  (WELLBUTRIN  XL) 300 MG 24 hr tablet TAKE 1 TABLET BY MOUTH ONCE DAILY IN THE MORNING 30 tablet 0   calcitRIOL  (ROCALTROL ) 0.25 MCG capsule Take 0.25 mcg by mouth daily.     cyanocobalamin  (VITAMIN B12) 1000 MCG/ML injection Inject 1,000 mcg into the muscle once a week.     dicyclomine  (BENTYL ) 10 MG capsule Take 1 capsule (10 mg total) by mouth 2 (two) times daily as needed for spasms. 60 capsule 2   doxycycline  (VIBRAMYCIN ) 100 MG capsule Take 100 mg by mouth 2 (two) times daily.     escitalopram  (LEXAPRO ) 10 MG tablet Take 1 tablet by mouth once daily 30 tablet 0   ferrous sulfate  325 (65 FE) MG EC tablet Take 1 tablet (325 mg total) by mouth daily with breakfast. 90 tablet 3   gabapentin  (NEURONTIN ) 100 MG capsule Take 100 mg by mouth 3 (three) times daily.     HYDROcodone -acetaminophen  (NORCO) 7.5-325 MG tablet 1 tablet Orally four times a day; Duration: 30 days As needed     levothyroxine  (SYNTHROID ) 100 MCG tablet Take 100 mcg by mouth daily.     loperamide  (IMODIUM ) 2 MG capsule Take 1 capsule (2 mg total) by mouth as needed for diarrhea or loose stools. 30 capsule 2   mesalamine  (APRISO ) 0.375 g 24 hr capsule Take 4 capsules (1.5 g total) by mouth daily. 120 capsule 2   metoprolol  succinate (TOPROL -XL) 25 MG 24 hr tablet Take 25 mg by mouth daily.     Multiple Vitamin (MULTIVITAMIN WITH MINERALS) TABS tablet Take 1 tablet by mouth daily.     mupirocin ointment (BACTROBAN) 2 % SMARTSIG:1 Application Topical 2-3 Times Daily     OLANZapine  (ZYPREXA ) 2.5 MG tablet Take 1 tablet (2.5 mg total) by mouth daily. 30 tablet 0   ondansetron  (ZOFRAN -ODT) 4 MG disintegrating tablet Take 1 tablet (4 mg total) by mouth every 8 (eight) hours as needed. 14 tablet 0    pantoprazole  (PROTONIX ) 40 MG tablet Take 1 tablet (40 mg total) by mouth daily. 90 tablet 3   Risankizumab -rzaa (SKYRIZI ) 180 MG/1.2ML SOCT Inject 180 mg into the skin every 8 (eight) weeks. Starting at week 12 on the week of 9/30 1.2 mL 6   rOPINIRole  (REQUIP ) 1 MG tablet Take 3 mg by mouth at bedtime.     rosuvastatin  (CRESTOR ) 10 MG tablet Take 10 mg by mouth daily.     sodium bicarbonate 650 MG tablet Take 650 mg by mouth 2 (two) times daily.     thiamine (VITAMIN B-1) 100 MG tablet Take 100 mg by mouth daily.     tiZANidine (ZANAFLEX) 4 MG tablet 1 tablet at bedtime as needed Orally; Duration: 30 days     No current facility-administered medications for this visit.    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   Cervical radiculopathy 04/18/2024   Cervical spondylosis without myelopathy 04/18/2024   Chronic kidney disease    Chronic pain syndrome 04/18/2024   CKD (chronic kidney disease) 04/16/2024   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   Crohn disease (HCC) 05/14/2018   Added automatically from request for surgery [id number]     Crohn's colitis (HCC)    Crohn's disease of small intestine (HCC) 08/01/2020   Depression (emotion)    Depressive disorder 03/04/2016   Dysrhythmia    Esophageal dysphagia 04/01/2023   Essential hypertension 03/04/2016   Essential tremor 09/24/2022   GERD (gastroesophageal reflux disease)    Hepatic cirrhosis (HCC) 12/04/2023   Hereditary and idiopathic neuropathy, unspecified 03/24/2013    Hypertension    Hypothyroid    Iron deficiency anemia, unspecified    Lumbosacral radiculopathy 03/24/2013   Lumbosacral spondylosis without myelopathy 04/18/2024   Malnutrition of moderate degree 07/15/2023   Mild cognitive impairment 09/24/2022   Nausea and vomiting 10/22/2023   Neuropathy    Normocytic anemia 02/12/2023   Restless legs syndrome 03/24/2013   Senile osteoporosis 01/07/2022   Sensorineural hearing loss (SNHL), bilateral 05/27/2019   Sleep apnea    cpap is not working per patient - needs to find a new doctor - not used since 7/16 per pat    Stage 3 chronic kidney disease (HCC)    Stage 3b chronic kidney disease (CKD) (HCC) 07/14/2023   Suicidal ideation 04/10/2024   Tinnitus, right ear 09/24/2022   Trochanteric bursitis of right hip 10/08/2017   UTI (urinary tract infection) 04/17/2024   Vertigo    followed by Dr Dann- in [place]    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    Past Surgical History:  Procedure Laterality Date   ABDOMINAL HYSTERECTOMY     BIOPSY  06/08/2018   Procedure: BIOPSY;  Surgeon: Golda Claudis PENNER, MD;  Location: [facility];  Service: Endoscopy;;  (colon)   BIOPSY  04/01/2023   Procedure: BIOPSY;  Surgeon: Eartha Flavors, Toribio, MD;  Location: [facility];  Service: Gastroenterology;;   CHOLECYSTECTOMY     COLON SURGERY     for Crohn's in the 80s in [place]    COLONOSCOPY N/A 03/11/2013   Procedure: COLONOSCOPY;  Surgeon: Claudis PENNER Golda, MD;  Location: [facility];  Service: Endoscopy;  Laterality: N/A;  1200   COLONOSCOPY N/A 06/08/2018   Procedure: COLONOSCOPY;  Surgeon: Golda Claudis PENNER, MD;  Location: [facility];  Service: Endoscopy;  Laterality: N/A;  8:25   COLONOSCOPY WITH PROPOFOL  N/A 04/01/2023   Procedure: COLONOSCOPY WITH PROPOFOL ;  Surgeon: Eartha Flavors Toribio, MD;  Location: [facility];  Service: Gastroenterology;  Laterality: N/A;   ESOPHAGEAL BRUSHING  04/01/2023   Procedure: ESOPHAGEAL BRUSHING;   Surgeon: Eartha Flavors Toribio, MD;  Location: [facility];  Service: Gastroenterology;;   ESOPHAGOGASTRODUODENOSCOPY N/A 10/07/2023   Procedure: EGD (ESOPHAGOGASTRODUODENOSCOPY);  Surgeon: Eartha Flavors, Toribio, MD;  Location: [facility];  Service: Gastroenterology;  Laterality: N/A;  215[PM, ASA 3, pt knows to arrive at 6:15   ESOPHAGOGASTRODUODENOSCOPY N/A 10/23/2023   Procedure: EGD (ESOPHAGOGASTRODUODENOSCOPY);  Surgeon: Dianna Specking, MD;  Location: [facility];  Service: Gastroenterology;  Laterality: N/A;   ESOPHAGOGASTRODUODENOSCOPY (EGD) WITH PROPOFOL  N/A 04/01/2023   Procedure: ESOPHAGOGASTRODUODENOSCOPY (EGD) WITH PROPOFOL ;  Surgeon: Eartha Flavors Toribio, MD;  Location: [facility];  Service: Gastroenterology;  Laterality: N/A;  1:00am;asa 3   fatty tumor removed     HARDWARE  REMOVAL Right 08/07/2016   Procedure: HARDWARE REMOVAL;  Surgeon: Dempsey Moan, MD;  Location: [facility];  Service: Orthopedics;  Laterality: Right;   Hip replacement      [place]   KNEE ARTHROSCOPY Left 03/29/2015   Procedure: ARTHROSCOPY LEFT KNEE WITH MENSICAL DEBRIDEMENT, chondroplasty;  Surgeon: Dempsey Moan, MD;  Location: [facility];  Service: Orthopedics;  Laterality: Left;   ORIF TIBIA PLATEAU Right 02/29/2016   Procedure: OPEN REDUCTION INTERNAL FIXATION (ORIF RIGHT  TIBIAL PLATEAU FRACTURE;  Surgeon: Dempsey Moan, MD;  Location: [facility];  Service: Orthopedics;  Laterality: Right;   POLYPECTOMY  04/01/2023   Procedure: POLYPECTOMY;  Surgeon: Eartha Angelia Sieving, MD;  [facility];  Service: Gastroenterology;;   TEE WITHOUT CARDIOVERSION N/A 03/27/2021   Procedure: TRANSESOPHAGEAL ECHOCARDIOGRAM (TEE);  Surgeon: Alvan Dorn FALCON, MD;  [facility];  Service: Endoscopy;  Laterality: N/A;   TOTAL KNEE ARTHROPLASTY Right 08/25/2017   Procedure: TOTAL RIGHT KNEE ARTHROPLASTY;  Surgeon: Moan Dempsey, MD;  Location: [facility];  Service: Orthopedics;   Laterality: Right;    Family History  Problem Relation Age of Onset   Crohn's disease Brother    Depression Mother    Anxiety disorder Mother    Depression Sister    Aneurysm Father    Colon cancer Neg Hx     Allergies as of 05/11/2024 - Review Complete 04/18/2024  Allergen Reaction Noted   Entyvio  [vedolizumab ] Shortness Of Breath and Nausea And Vomiting 01/13/2024   Robaxin  [methocarbamol ] Itching 07/29/2016   Sulfa antibiotics Itching 07/29/2016    Social History   Socioeconomic History   Marital status: Married    Spouse name: Not on file   Number of children: Not on file   Years of education: Not on file   Highest education level: Not on file  Occupational History   Not on file  Tobacco Use   Smoking status: Never   Smokeless tobacco: Never  Vaping Use   Vaping status: Never Used  Substance and Sexual Activity   Alcohol  use: No    Alcohol /week: 0.0 standard drinks of alcohol    Drug use: No   Sexual activity: Not Currently  Other Topics Concern   Not on file  Social History Narrative   Not on file   Social Drivers of Health   Financial Resource Strain: Not on file  Food Insecurity: Patient Unable To Answer (04/09/2024)   Hunger Vital Sign    Worried About Programme Researcher, Broadcasting/film/video in the Last Year: Patient unable to answer    Ran Out of Food in the Last Year: Patient unable to answer  Transportation Needs: Patient Unable To Answer (04/09/2024)   PRAPARE - Transportation    Lack of Transportation (Medical): Patient unable to answer    Lack of Transportation (Non-Medical): Patient unable to answer  Physical Activity: Not on file  Stress: Not on file  Social Connections: Unknown (04/09/2024)   Social Connection and Isolation Panel    Frequency of Communication with Friends and Family: Patient unable to answer    Frequency of Social Gatherings with Friends and Family: Patient unable to answer    Attends Religious Services: Not on file    Active Member of Clubs or  Organizations: Patient declined    Attends Banker Meetings: Patient declined    Marital Status: Patient declined    Review of Systems   Gen: +anorexia and weight loss.  Denies fever, chills. Denies fatigue, weakness, weight  gain. CV: + Peripheral edema to RLE.  Denies chest pain, palpitations, syncope, and claudication. Resp: + Dyspnea at rest and with exertion.  Denies cough, wheezing, coughing up blood, and pleurisy. GI: See HPI Derm: Denies rash, itching, dry skin Psych: + anxiety, stress. Denies depression,memory loss, confusion. No homicidal or suicidal ideation.  Heme: + bruising. Denies bleeding, and enlarged lymph nodes. + Joint pain  Physical Exam   BP 138/86 (BP Location: Right Arm, Patient Position: Sitting, Cuff Size: Normal)   Pulse 81   Temp 98.8 F (37.1 C) (Temporal)   Ht 5' 2 (1.575 m)   Wt 125 lb 9.6 oz (57 kg)   BMI 22.97 kg/m   General:   Alert and oriented. No distress noted. Pleasant and cooperative.  Head:  Normocephalic and atraumatic. Eyes:  Conjuctiva clear without scleral icterus. Lungs:  Clear to auscultation bilaterally. No wheezes, rales, or rhonchi. No distress.  Abdomen:  +BS, soft, non-tender and non-distended. No rebound or guarding. No HSM or masses noted. Rectal: Deferred Msk:  Symmetrical without gross deformities. Normal posture. Extremities:  Without edema. Neurologic:  Alert and  oriented x4 Psych:  Alert and cooperative.  Anxious..  Assessment & Plan  Jessica [name] is a 73 y.o. female presenting today to discuss follow-up of Crohn's, cirrhosis, anemia, diarrhea, and medication refills.    Cirrhosis of liver (under evaluation for etiology) Cirrhosis with normal liver function tests, most likely secondary to [facility] however unable to rule out possible autoimmune cause (PBC/AIH or overalp). 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 Liver biopsy in August 2025 which noted cirrhosis with  some possible regression and no definitive etiology however could be MASH, DILI, or autoimmune. Previous elevated liver markers require monitoring. Missed hepatology appointment with [facility] therefore will keep prior consultation with [facility] in December.  Currently due for hepatoma screening. We discussed necessary monitoring. Currently without HE, jaundice, or pruritus.  Prior confusion/AMS likely more psych related.  - Scheduled repeat liver ultrasound and AFP due to cirrhosis. - Ordered additional liver function tests - recheck autoimmune markers - Instructed patient to keep hepatology appointment on December 11th. - Cirrhosis diet reinforced, increasing protein intake.   Crohn's disease with chronic diarrhea and abdominal pain Chronic diarrhea and abdominal pain improved with Skyrizi  currently. Tried and failed mesalamine , Entyvio , and Rinvoq  due to various side effects/adverse effects (PE/DVT, ongoing endoscopic inflammation, worsening joint pain, URI). Currently on dicyclomine  and Imodium . Plan to assess Skyrizi 's efficacy on diarrhea by reducing Imodium  use and potentially dicyclomine  use as well. - Instructed patient to stop Imodium  unless more than four bowel movements occur daily.  - Instructed patient to monitor bowel movements without Imodium  for a few days but could continue dicyclomine  once daily. - Will consider discontinuing dicyclomine  and only continuing Imodium  if diarrhea persists while only on dicyclomine .  - Discontinued mesalamine  as Skyrizi  is now being used. - Will consider repeat colonoscopy in 3-6 months to reassess for endoscopic remission.   Unintentional weight loss and suspected protein-calorie malnutrition Significant weight loss from 140s to 128 pounds since August. Decreased appetite and difficulty maintaining weight. Importance of protein intake for liver health and weight maintenance discussed. Likely psychosocial component.  - Encouraged increased protein intake  through diet or supplements like Boost, Ensure, or Fairlife Protein Shake. - Instructed to monitor weight at home and report any further weight loss. - Will consider referral to a dietitian for nutritional guidance.  GERD and Intermittent nausea  GERD managed with pantoprazole  40 mg daily. Intermittent nausea with occasional vomiting. Zofran  available at home for nausea management. - Continue pantoprazole  for GERD management. - Use Zofran  as needed for nausea.  Anxiety disorder Increased anxiety impacting appetite and weight. Current medication regimen includes Wellbutrin , Lexapro , and Zyprexa . Need for better anxiety management discussed, she is following with behavioral health. - Continue to follow with b ehavioral health for management.     Follow up   Follow up 3 months.    Charmaine Melia, MSN, FNP-BC, AGACNP-BC [facility]  I have reviewed the note and agree with the APP's assessment as described in this progress note  Jessica Fortune, MD Gastroenterology and Hepatology [facility]

## 2024-05-11 NOTE — Patient Instructions (Addendum)
 06/17/2024 1:30 PM EST Office Visit [facility]  [address]  [address]  [place], [place] [address]  [phone]  Hays Stephane LABOR, NP  [address] [facility] [place]  [address]  [place], [place] [address]  [phone] (Work)  [phone] (Fax)     Continue Skyrizi  every 8 weeks.  Should be due for her next dose on 06/01/2024.  In regards to your Crohn's and your diarrhea I also want to try stopping Imodium  a few days in a row when you are not leaving the house to make sure you are not having any excessive diarrhea.  I only want you to take Imodium  if you are having more than 4 bowel movements a day.  If you are not able to tolerate this then I may recommend stopping the dicyclomine  and only taking that as needed and not on a scheduled basis.  This is to best tell us  how well the Skyrizi  is actually working.  Please have labs performed at [facility].  This is to help us  calculate your liver score and reassess some of your autoimmune markers that were positive in the past.  We will also get you scheduled for a right upper quadrant ultrasound to evaluate for any liver lesions.  This is part of regular screening for liver cancer.  This is something we do for all cirrhosis patients.  Cirrhosis Lifestyle Recommendations:  High-protein diet from a primarily plant-based diet. Avoid red meat.  No raw or undercooked meat, seafood, or shellfish. Low-fat/cholesterol/carbohydrate diet. Limit sodium to no more than 2000 mg/day including everything that you eat and drink. Recommend at least 30 minutes of aerobic and resistance exercise 3 days/week. Limit Tylenol  to 2000 mg daily.   It is very important that you increase your protein intake.  My goal for you is to drink at least 2 protein shakes per day whether that is Ensure, boost, fair life protein, Premier protein, or any other protein supplement with at least 25 g per dose.   Please monitor your weight at home and if  you lose another 5-10 pounds within the  next 6-8 weeks then we need to repeat some imaging.  Please call the office if that is the case.  We will follow-up in 3 months.  It was a pleasure to see you today. I want to create trusting relationships with patients. If you receive a survey regarding your visit,  I greatly appreciate you taking time to fill this out on paper or through your MyChart. I value your feedback.  Charmaine Melia, MSN, FNP-BC, AGACNP-BC [facility]

## 2024-05-12 DIAGNOSIS — K5090 Crohn's disease, unspecified, without complications: Secondary | ICD-10-CM | POA: Diagnosis not present

## 2024-05-12 DIAGNOSIS — D333 Benign neoplasm of cranial nerves: Secondary | ICD-10-CM | POA: Diagnosis not present

## 2024-05-12 DIAGNOSIS — I2699 Other pulmonary embolism without acute cor pulmonale: Secondary | ICD-10-CM | POA: Diagnosis not present

## 2024-05-12 DIAGNOSIS — F331 Major depressive disorder, recurrent, moderate: Secondary | ICD-10-CM | POA: Diagnosis not present

## 2024-05-12 DIAGNOSIS — Z299 Encounter for prophylactic measures, unspecified: Secondary | ICD-10-CM | POA: Diagnosis not present

## 2024-05-12 DIAGNOSIS — I10 Essential (primary) hypertension: Secondary | ICD-10-CM | POA: Diagnosis not present

## 2024-05-12 DIAGNOSIS — E039 Hypothyroidism, unspecified: Secondary | ICD-10-CM | POA: Diagnosis not present

## 2024-05-13 DIAGNOSIS — H6123 Impacted cerumen, bilateral: Secondary | ICD-10-CM | POA: Diagnosis not present

## 2024-05-13 DIAGNOSIS — Z0110 Encounter for examination of ears and hearing without abnormal findings: Secondary | ICD-10-CM | POA: Diagnosis not present

## 2024-05-13 DIAGNOSIS — Z7901 Long term (current) use of anticoagulants: Secondary | ICD-10-CM | POA: Diagnosis not present

## 2024-05-13 DIAGNOSIS — D333 Benign neoplasm of cranial nerves: Secondary | ICD-10-CM | POA: Diagnosis not present

## 2024-05-13 DIAGNOSIS — H903 Sensorineural hearing loss, bilateral: Secondary | ICD-10-CM | POA: Diagnosis not present

## 2024-05-13 DIAGNOSIS — H9313 Tinnitus, bilateral: Secondary | ICD-10-CM | POA: Diagnosis not present

## 2024-05-13 DIAGNOSIS — Z974 Presence of external hearing-aid: Secondary | ICD-10-CM | POA: Diagnosis not present

## 2024-05-13 DIAGNOSIS — Z570 Occupational exposure to noise: Secondary | ICD-10-CM | POA: Diagnosis not present

## 2024-05-13 DIAGNOSIS — H938X1 Other specified disorders of right ear: Secondary | ICD-10-CM | POA: Diagnosis not present

## 2024-05-13 DIAGNOSIS — R2689 Other abnormalities of gait and mobility: Secondary | ICD-10-CM | POA: Diagnosis not present

## 2024-05-14 ENCOUNTER — Inpatient Hospital Stay: Attending: Oncology

## 2024-05-14 ENCOUNTER — Inpatient Hospital Stay

## 2024-05-14 VITALS — BP 148/71 | HR 76 | Temp 98.0°F | Resp 18

## 2024-05-14 DIAGNOSIS — D631 Anemia in chronic kidney disease: Secondary | ICD-10-CM | POA: Diagnosis present

## 2024-05-14 DIAGNOSIS — D508 Other iron deficiency anemias: Secondary | ICD-10-CM | POA: Insufficient documentation

## 2024-05-14 DIAGNOSIS — D509 Iron deficiency anemia, unspecified: Secondary | ICD-10-CM

## 2024-05-14 DIAGNOSIS — Z79899 Other long term (current) drug therapy: Secondary | ICD-10-CM | POA: Diagnosis not present

## 2024-05-14 DIAGNOSIS — N1832 Chronic kidney disease, stage 3b: Secondary | ICD-10-CM

## 2024-05-14 DIAGNOSIS — N1831 Chronic kidney disease, stage 3a: Secondary | ICD-10-CM | POA: Insufficient documentation

## 2024-05-14 LAB — CBC WITH DIFFERENTIAL/PLATELET
Abs Immature Granulocytes: 0.01 K/uL (ref 0.00–0.07)
Basophils Absolute: 0 K/uL (ref 0.0–0.1)
Basophils Relative: 1 %
Eosinophils Absolute: 0.1 K/uL (ref 0.0–0.5)
Eosinophils Relative: 6 %
HCT: 31.3 % — ABNORMAL LOW (ref 36.0–46.0)
Hemoglobin: 10.6 g/dL — ABNORMAL LOW (ref 12.0–15.0)
Immature Granulocytes: 0 %
Lymphocytes Relative: 45 %
Lymphs Abs: 1.1 K/uL (ref 0.7–4.0)
MCH: 32.1 pg (ref 26.0–34.0)
MCHC: 33.9 g/dL (ref 30.0–36.0)
MCV: 94.8 fL (ref 80.0–100.0)
Monocytes Absolute: 0.2 K/uL (ref 0.1–1.0)
Monocytes Relative: 7 %
Neutro Abs: 1 K/uL — ABNORMAL LOW (ref 1.7–7.7)
Neutrophils Relative %: 41 %
Platelets: 135 K/uL — ABNORMAL LOW (ref 150–400)
RBC: 3.3 MIL/uL — ABNORMAL LOW (ref 3.87–5.11)
RDW: 12.2 % (ref 11.5–15.5)
WBC: 2.5 K/uL — ABNORMAL LOW (ref 4.0–10.5)
nRBC: 0 % (ref 0.0–0.2)

## 2024-05-14 MED ORDER — EPOETIN ALFA-EPBX 10000 UNIT/ML IJ SOLN
10000.0000 [IU] | Freq: Once | INTRAMUSCULAR | Status: AC
  Administered 2024-05-14: 10000 [IU] via SUBCUTANEOUS
  Filled 2024-05-14: qty 1

## 2024-05-14 NOTE — Patient Instructions (Signed)

## 2024-05-14 NOTE — Progress Notes (Signed)
Patient presents today for Retacrit injection. Hemoglobin reviewed prior to administration. VSS tolerated without incident or complaint. See MAR for details. Patient stable during and after injection. Patient discharged in satisfactory condition with no s/s of distress noted.  

## 2024-05-18 DIAGNOSIS — D333 Benign neoplasm of cranial nerves: Secondary | ICD-10-CM | POA: Diagnosis not present

## 2024-05-18 DIAGNOSIS — H6591 Unspecified nonsuppurative otitis media, right ear: Secondary | ICD-10-CM | POA: Diagnosis not present

## 2024-05-18 DIAGNOSIS — H903 Sensorineural hearing loss, bilateral: Secondary | ICD-10-CM | POA: Diagnosis not present

## 2024-05-18 DIAGNOSIS — H9311 Tinnitus, right ear: Secondary | ICD-10-CM | POA: Diagnosis not present

## 2024-05-19 ENCOUNTER — Encounter: Payer: Self-pay | Admitting: Gastroenterology

## 2024-05-19 ENCOUNTER — Encounter: Payer: Self-pay | Admitting: *Deleted

## 2024-05-19 NOTE — Telephone Encounter (Signed)
 Called pt to give appt. She advised to send to her mychart since she was not at home right now.

## 2024-05-27 DIAGNOSIS — I2699 Other pulmonary embolism without acute cor pulmonale: Secondary | ICD-10-CM | POA: Diagnosis not present

## 2024-05-27 DIAGNOSIS — K7460 Unspecified cirrhosis of liver: Secondary | ICD-10-CM | POA: Diagnosis not present

## 2024-05-27 DIAGNOSIS — F331 Major depressive disorder, recurrent, moderate: Secondary | ICD-10-CM | POA: Diagnosis not present

## 2024-05-27 DIAGNOSIS — I10 Essential (primary) hypertension: Secondary | ICD-10-CM | POA: Diagnosis not present

## 2024-05-27 DIAGNOSIS — K5090 Crohn's disease, unspecified, without complications: Secondary | ICD-10-CM | POA: Diagnosis not present

## 2024-05-27 DIAGNOSIS — Z299 Encounter for prophylactic measures, unspecified: Secondary | ICD-10-CM | POA: Diagnosis not present

## 2024-05-27 DIAGNOSIS — N1831 Chronic kidney disease, stage 3a: Secondary | ICD-10-CM | POA: Diagnosis not present

## 2024-05-28 ENCOUNTER — Ambulatory Visit (HOSPITAL_COMMUNITY)
Admission: RE | Admit: 2024-05-28 | Discharge: 2024-05-28 | Disposition: A | Discharge status: Home / Self Care | Source: Ambulatory Visit | Attending: Gastroenterology | Admitting: Gastroenterology

## 2024-05-28 DIAGNOSIS — K7460 Unspecified cirrhosis of liver: Secondary | ICD-10-CM | POA: Insufficient documentation

## 2024-05-28 DIAGNOSIS — Z9049 Acquired absence of other specified parts of digestive tract: Secondary | ICD-10-CM | POA: Diagnosis not present

## 2024-05-30 ENCOUNTER — Ambulatory Visit: Payer: Self-pay | Admitting: Gastroenterology

## 2024-05-31 ENCOUNTER — Ambulatory Visit (HOSPITAL_COMMUNITY): Admitting: Psychiatry

## 2024-06-10 ENCOUNTER — Ambulatory Visit (HOSPITAL_COMMUNITY): Admitting: Psychiatry

## 2024-06-10 DIAGNOSIS — G5603 Carpal tunnel syndrome, bilateral upper limbs: Secondary | ICD-10-CM | POA: Diagnosis not present

## 2024-06-11 ENCOUNTER — Inpatient Hospital Stay

## 2024-06-15 DIAGNOSIS — R791 Abnormal coagulation profile: Secondary | ICD-10-CM | POA: Diagnosis not present

## 2024-06-15 DIAGNOSIS — Z7901 Long term (current) use of anticoagulants: Secondary | ICD-10-CM | POA: Diagnosis not present

## 2024-06-15 DIAGNOSIS — R0602 Shortness of breath: Secondary | ICD-10-CM | POA: Diagnosis not present

## 2024-06-15 DIAGNOSIS — R0600 Dyspnea, unspecified: Secondary | ICD-10-CM | POA: Diagnosis not present

## 2024-06-15 DIAGNOSIS — N183 Chronic kidney disease, stage 3 unspecified: Secondary | ICD-10-CM | POA: Diagnosis not present

## 2024-06-15 DIAGNOSIS — R079 Chest pain, unspecified: Secondary | ICD-10-CM | POA: Diagnosis not present

## 2024-06-15 DIAGNOSIS — Z86711 Personal history of pulmonary embolism: Secondary | ICD-10-CM | POA: Diagnosis not present

## 2024-06-19 DIAGNOSIS — Z96652 Presence of left artificial knee joint: Secondary | ICD-10-CM | POA: Diagnosis not present

## 2024-06-19 DIAGNOSIS — M79662 Pain in left lower leg: Secondary | ICD-10-CM | POA: Diagnosis not present

## 2024-06-19 DIAGNOSIS — Z7901 Long term (current) use of anticoagulants: Secondary | ICD-10-CM | POA: Diagnosis not present

## 2024-06-19 DIAGNOSIS — M25562 Pain in left knee: Secondary | ICD-10-CM | POA: Diagnosis not present

## 2024-06-19 DIAGNOSIS — S8012XA Contusion of left lower leg, initial encounter: Secondary | ICD-10-CM | POA: Diagnosis not present

## 2024-06-19 DIAGNOSIS — M1712 Unilateral primary osteoarthritis, left knee: Secondary | ICD-10-CM | POA: Diagnosis not present

## 2024-07-05 ENCOUNTER — Encounter: Payer: Self-pay | Admitting: *Deleted

## 2024-07-09 ENCOUNTER — Inpatient Hospital Stay

## 2024-07-09 ENCOUNTER — Inpatient Hospital Stay: Admitting: Oncology

## 2024-08-05 ENCOUNTER — Encounter: Payer: Self-pay | Admitting: *Deleted

## 2024-08-11 ENCOUNTER — Telehealth: Payer: Self-pay | Admitting: Gastroenterology

## 2024-08-11 NOTE — Telephone Encounter (Signed)
 Noted.

## 2024-08-11 NOTE — Telephone Encounter (Signed)
 Patient spouse called in gave information that patient had received a letter to follow up with an appointment. Spouse states patient has moved to [place]  and changed her number he was not allowed to give it out.
